# Patient Record
Sex: Female | Born: 1965 | Race: White | Hispanic: No | Marital: Married | Smoking: Former smoker
Health system: Southern US, Community
[De-identification: ages and names within clinical notes are randomized; demographics above are authoritative.]

## PROBLEM LIST (undated history)

## (undated) DIAGNOSIS — K219 Gastro-esophageal reflux disease without esophagitis: Secondary | ICD-10-CM

## (undated) DIAGNOSIS — M1990 Unspecified osteoarthritis, unspecified site: Secondary | ICD-10-CM

## (undated) DIAGNOSIS — K279 Peptic ulcer, site unspecified, unspecified as acute or chronic, without hemorrhage or perforation: Secondary | ICD-10-CM

## (undated) DIAGNOSIS — F4310 Post-traumatic stress disorder, unspecified: Secondary | ICD-10-CM

## (undated) DIAGNOSIS — T7840XA Allergy, unspecified, initial encounter: Secondary | ICD-10-CM

## (undated) DIAGNOSIS — K449 Diaphragmatic hernia without obstruction or gangrene: Secondary | ICD-10-CM

## (undated) DIAGNOSIS — F419 Anxiety disorder, unspecified: Secondary | ICD-10-CM

## (undated) DIAGNOSIS — J45909 Unspecified asthma, uncomplicated: Secondary | ICD-10-CM

## (undated) DIAGNOSIS — F32A Depression, unspecified: Secondary | ICD-10-CM

## (undated) DIAGNOSIS — R011 Cardiac murmur, unspecified: Secondary | ICD-10-CM

## (undated) DIAGNOSIS — I10 Essential (primary) hypertension: Secondary | ICD-10-CM

## (undated) DIAGNOSIS — N390 Urinary tract infection, site not specified: Secondary | ICD-10-CM

## (undated) DIAGNOSIS — F329 Major depressive disorder, single episode, unspecified: Secondary | ICD-10-CM

## (undated) DIAGNOSIS — D649 Anemia, unspecified: Secondary | ICD-10-CM

## (undated) HISTORY — DX: Peptic ulcer, site unspecified, unspecified as acute or chronic, without hemorrhage or perforation: K27.9

## (undated) HISTORY — PX: REDUCTION MAMMAPLASTY: SUR839

## (undated) HISTORY — DX: Diaphragmatic hernia without obstruction or gangrene: K44.9

## (undated) HISTORY — DX: Unspecified asthma, uncomplicated: J45.909

## (undated) HISTORY — DX: Cardiac murmur, unspecified: R01.1

## (undated) HISTORY — DX: Gastro-esophageal reflux disease without esophagitis: K21.9

## (undated) HISTORY — DX: Urinary tract infection, site not specified: N39.0

## (undated) HISTORY — PX: TONSILLECTOMY: SUR1361

## (undated) HISTORY — DX: Allergy, unspecified, initial encounter: T78.40XA

## (undated) HISTORY — PX: TUBAL LIGATION: SHX77

## (undated) HISTORY — PX: ABDOMINAL HYSTERECTOMY: SHX81

## (undated) HISTORY — DX: Anxiety disorder, unspecified: F41.9

## (undated) HISTORY — PX: BREAST BIOPSY: SHX20

## (undated) HISTORY — DX: Anemia, unspecified: D64.9

## (undated) HISTORY — PX: GASTRIC BYPASS: SHX52

## (undated) HISTORY — DX: Major depressive disorder, single episode, unspecified: F32.9

## (undated) HISTORY — DX: Depression, unspecified: F32.A

## (undated) HISTORY — DX: Post-traumatic stress disorder, unspecified: F43.10

## (undated) HISTORY — DX: Essential (primary) hypertension: I10

---

## 1998-02-09 ENCOUNTER — Ambulatory Visit (HOSPITAL_COMMUNITY): Admission: RE | Admit: 1998-02-09 | Discharge: 1998-02-09 | Payer: Self-pay | Admitting: Gastroenterology

## 2000-03-23 ENCOUNTER — Ambulatory Visit (HOSPITAL_COMMUNITY): Admission: RE | Admit: 2000-03-23 | Discharge: 2000-03-23 | Payer: Self-pay | Admitting: Gastroenterology

## 2000-03-23 ENCOUNTER — Encounter (INDEPENDENT_AMBULATORY_CARE_PROVIDER_SITE_OTHER): Payer: Self-pay | Admitting: Specialist

## 2000-03-28 ENCOUNTER — Other Ambulatory Visit: Admission: RE | Admit: 2000-03-28 | Discharge: 2000-03-28 | Payer: Self-pay | Admitting: Gynecology

## 2001-04-10 ENCOUNTER — Other Ambulatory Visit: Admission: RE | Admit: 2001-04-10 | Discharge: 2001-04-10 | Payer: Self-pay | Admitting: Gynecology

## 2001-04-11 ENCOUNTER — Ambulatory Visit (HOSPITAL_COMMUNITY): Admission: RE | Admit: 2001-04-11 | Discharge: 2001-04-11 | Payer: Self-pay | Admitting: General Surgery

## 2001-05-17 ENCOUNTER — Encounter: Payer: Self-pay | Admitting: Gynecology

## 2001-05-17 ENCOUNTER — Encounter: Admission: RE | Admit: 2001-05-17 | Discharge: 2001-05-17 | Payer: Self-pay | Admitting: Gynecology

## 2002-04-14 ENCOUNTER — Other Ambulatory Visit: Admission: RE | Admit: 2002-04-14 | Discharge: 2002-04-14 | Payer: Self-pay | Admitting: Gynecology

## 2002-11-06 ENCOUNTER — Encounter: Payer: Self-pay | Admitting: Family Medicine

## 2002-11-06 ENCOUNTER — Encounter: Admission: RE | Admit: 2002-11-06 | Discharge: 2002-11-06 | Payer: Self-pay | Admitting: Family Medicine

## 2003-04-15 ENCOUNTER — Other Ambulatory Visit: Admission: RE | Admit: 2003-04-15 | Discharge: 2003-04-15 | Payer: Self-pay | Admitting: Gynecology

## 2003-05-06 ENCOUNTER — Encounter: Admission: RE | Admit: 2003-05-06 | Discharge: 2003-08-04 | Payer: Self-pay | Admitting: Family Medicine

## 2003-05-20 ENCOUNTER — Encounter: Admission: RE | Admit: 2003-05-20 | Discharge: 2003-05-20 | Payer: Self-pay | Admitting: Family Medicine

## 2003-10-01 ENCOUNTER — Encounter: Admission: RE | Admit: 2003-10-01 | Discharge: 2003-10-01 | Payer: Self-pay | Admitting: Family Medicine

## 2003-11-02 ENCOUNTER — Encounter (INDEPENDENT_AMBULATORY_CARE_PROVIDER_SITE_OTHER): Payer: Self-pay | Admitting: *Deleted

## 2003-11-02 ENCOUNTER — Observation Stay (HOSPITAL_COMMUNITY): Admission: RE | Admit: 2003-11-02 | Discharge: 2003-11-03 | Payer: Self-pay | Admitting: Gynecology

## 2004-03-15 ENCOUNTER — Encounter: Admission: RE | Admit: 2004-03-15 | Discharge: 2004-06-13 | Payer: Self-pay | Admitting: Family Medicine

## 2004-04-18 ENCOUNTER — Other Ambulatory Visit: Admission: RE | Admit: 2004-04-18 | Discharge: 2004-04-18 | Payer: Self-pay | Admitting: Gynecology

## 2004-05-03 ENCOUNTER — Encounter: Admission: RE | Admit: 2004-05-03 | Discharge: 2004-05-03 | Payer: Self-pay | Admitting: Family Medicine

## 2004-07-07 ENCOUNTER — Encounter: Admission: RE | Admit: 2004-07-07 | Discharge: 2004-10-05 | Payer: Self-pay | Admitting: Family Medicine

## 2004-07-18 ENCOUNTER — Encounter: Admission: RE | Admit: 2004-07-18 | Discharge: 2004-08-01 | Payer: Self-pay | Admitting: Surgery

## 2004-08-08 ENCOUNTER — Ambulatory Visit (HOSPITAL_COMMUNITY): Admission: RE | Admit: 2004-08-08 | Discharge: 2004-08-08 | Payer: Self-pay | Admitting: Surgery

## 2004-08-12 ENCOUNTER — Ambulatory Visit (HOSPITAL_BASED_OUTPATIENT_CLINIC_OR_DEPARTMENT_OTHER): Admission: RE | Admit: 2004-08-12 | Discharge: 2004-08-12 | Payer: Self-pay | Admitting: Surgery

## 2004-08-15 ENCOUNTER — Ambulatory Visit: Payer: Self-pay | Admitting: Internal Medicine

## 2004-08-17 ENCOUNTER — Ambulatory Visit (HOSPITAL_COMMUNITY): Admission: RE | Admit: 2004-08-17 | Discharge: 2004-08-17 | Payer: Self-pay | Admitting: Surgery

## 2004-08-21 ENCOUNTER — Ambulatory Visit: Payer: Self-pay | Admitting: Internal Medicine

## 2004-09-05 ENCOUNTER — Ambulatory Visit: Payer: Self-pay | Admitting: Internal Medicine

## 2004-11-02 ENCOUNTER — Encounter: Admission: RE | Admit: 2004-11-02 | Discharge: 2005-01-22 | Payer: Self-pay | Admitting: Family Medicine

## 2004-11-29 ENCOUNTER — Ambulatory Visit (HOSPITAL_COMMUNITY): Admission: RE | Admit: 2004-11-29 | Discharge: 2004-11-29 | Payer: Self-pay | Admitting: Family Medicine

## 2004-11-29 ENCOUNTER — Encounter: Admission: RE | Admit: 2004-11-29 | Discharge: 2004-11-29 | Payer: Self-pay | Admitting: Family Medicine

## 2005-01-23 HISTORY — PX: GASTRIC BYPASS: SHX52

## 2005-03-30 ENCOUNTER — Encounter: Admission: RE | Admit: 2005-03-30 | Discharge: 2005-06-28 | Payer: Self-pay | Admitting: Family Medicine

## 2005-05-08 ENCOUNTER — Other Ambulatory Visit: Admission: RE | Admit: 2005-05-08 | Discharge: 2005-05-08 | Payer: Self-pay | Admitting: Gynecology

## 2005-05-10 ENCOUNTER — Encounter: Admission: RE | Admit: 2005-05-10 | Discharge: 2005-05-10 | Payer: Self-pay | Admitting: Gynecology

## 2005-06-08 ENCOUNTER — Encounter: Admission: RE | Admit: 2005-06-08 | Discharge: 2005-06-08 | Payer: Self-pay | Admitting: Gynecology

## 2005-07-12 ENCOUNTER — Encounter: Admission: RE | Admit: 2005-07-12 | Discharge: 2005-10-10 | Payer: Self-pay | Admitting: Family Medicine

## 2005-09-04 ENCOUNTER — Encounter: Admission: RE | Admit: 2005-09-04 | Discharge: 2005-09-04 | Payer: Self-pay | Admitting: Family Medicine

## 2005-11-06 ENCOUNTER — Encounter: Admission: RE | Admit: 2005-11-06 | Discharge: 2006-02-04 | Payer: Self-pay | Admitting: Family Medicine

## 2005-11-20 ENCOUNTER — Inpatient Hospital Stay (HOSPITAL_COMMUNITY): Admission: RE | Admit: 2005-11-20 | Discharge: 2005-11-23 | Payer: Self-pay | Admitting: Surgery

## 2005-11-21 ENCOUNTER — Encounter: Payer: Self-pay | Admitting: Vascular Surgery

## 2005-11-30 ENCOUNTER — Encounter: Admission: RE | Admit: 2005-11-30 | Discharge: 2005-11-30 | Payer: Self-pay | Admitting: Surgery

## 2006-01-08 ENCOUNTER — Ambulatory Visit: Payer: Self-pay | Admitting: Family Medicine

## 2006-01-08 LAB — CONVERTED CEMR LAB
AST: 33 units/L (ref 0–37)
Albumin: 3.6 g/dL (ref 3.5–5.2)
Alkaline Phosphatase: 52 units/L (ref 39–117)
BUN: 8 mg/dL (ref 6–23)
Calcium: 9.2 mg/dL (ref 8.4–10.5)
Chol/HDL Ratio, serum: 3.3
Cholesterol: 118 mg/dL (ref 0–200)
Creatinine, Ser: 1 mg/dL (ref 0.4–1.2)
Eosinophil percent: 4.2 % (ref 0.0–5.0)
Ferritin: 171.6 ng/mL (ref 10.0–291.0)
Folate: >20 ng/mL
HCT: 40.6 % (ref 36.0–46.0)
Hemoglobin: 13.5 g/dL (ref 12.0–15.0)
LDL Cholesterol: 69 mg/dL (ref 0–99)
Neutrophils Relative %: 48.2 % (ref 43.0–77.0)
Potassium: 3.5 meq/L (ref 3.5–5.1)
RDW: 12.9 % (ref 11.5–14.6)
Total Bilirubin: 0.8 mg/dL (ref 0.3–1.2)
Total Protein: 6 g/dL (ref 6.0–8.3)
VLDL: 13 mg/dL (ref 0–40)

## 2006-01-31 ENCOUNTER — Ambulatory Visit: Payer: Self-pay | Admitting: Family Medicine

## 2006-01-31 LAB — CONVERTED CEMR LAB: Vit D, 1,25-Dihydroxy: 43 (ref 20–57)

## 2006-02-02 ENCOUNTER — Ambulatory Visit: Payer: Self-pay | Admitting: Family Medicine

## 2006-02-12 ENCOUNTER — Ambulatory Visit: Payer: Self-pay | Admitting: Family Medicine

## 2006-02-13 ENCOUNTER — Encounter: Admission: RE | Admit: 2006-02-13 | Discharge: 2006-02-13 | Payer: Self-pay | Admitting: Family Medicine

## 2006-02-14 ENCOUNTER — Encounter: Admission: RE | Admit: 2006-02-14 | Discharge: 2006-05-15 | Payer: Self-pay | Admitting: Family Medicine

## 2006-02-20 ENCOUNTER — Ambulatory Visit: Payer: Self-pay | Admitting: Family Medicine

## 2006-03-06 ENCOUNTER — Ambulatory Visit: Payer: Self-pay | Admitting: Family Medicine

## 2006-03-07 ENCOUNTER — Ambulatory Visit: Payer: Self-pay | Admitting: *Deleted

## 2006-03-16 ENCOUNTER — Encounter: Admission: RE | Admit: 2006-03-16 | Discharge: 2006-03-16 | Payer: Self-pay | Admitting: Family Medicine

## 2006-05-04 ENCOUNTER — Ambulatory Visit: Payer: Self-pay | Admitting: Family Medicine

## 2006-05-09 ENCOUNTER — Encounter: Admission: RE | Admit: 2006-05-09 | Discharge: 2006-08-07 | Payer: Self-pay | Admitting: Family Medicine

## 2006-05-14 ENCOUNTER — Other Ambulatory Visit: Admission: RE | Admit: 2006-05-14 | Discharge: 2006-05-14 | Payer: Self-pay | Admitting: Gynecology

## 2006-05-16 ENCOUNTER — Encounter: Admission: RE | Admit: 2006-05-16 | Discharge: 2006-05-16 | Payer: Self-pay | Admitting: Family Medicine

## 2006-07-31 ENCOUNTER — Ambulatory Visit (HOSPITAL_BASED_OUTPATIENT_CLINIC_OR_DEPARTMENT_OTHER): Admission: RE | Admit: 2006-07-31 | Discharge: 2006-08-01 | Payer: Self-pay | Admitting: Plastic Surgery

## 2006-07-31 ENCOUNTER — Encounter (INDEPENDENT_AMBULATORY_CARE_PROVIDER_SITE_OTHER): Payer: Self-pay | Admitting: Plastic Surgery

## 2006-08-10 ENCOUNTER — Encounter: Payer: Self-pay | Admitting: Family Medicine

## 2006-11-28 ENCOUNTER — Encounter: Admission: RE | Admit: 2006-11-28 | Discharge: 2006-11-28 | Payer: Self-pay | Admitting: Family Medicine

## 2006-12-28 ENCOUNTER — Ambulatory Visit: Payer: Self-pay | Admitting: Family Medicine

## 2006-12-28 DIAGNOSIS — J069 Acute upper respiratory infection, unspecified: Secondary | ICD-10-CM | POA: Insufficient documentation

## 2007-02-19 ENCOUNTER — Ambulatory Visit: Payer: Self-pay | Admitting: Family Medicine

## 2007-02-19 DIAGNOSIS — Z9884 Bariatric surgery status: Secondary | ICD-10-CM | POA: Insufficient documentation

## 2007-02-25 ENCOUNTER — Ambulatory Visit: Payer: Self-pay | Admitting: Family Medicine

## 2007-03-06 DIAGNOSIS — R945 Abnormal results of liver function studies: Secondary | ICD-10-CM | POA: Insufficient documentation

## 2007-03-06 LAB — CONVERTED CEMR LAB
ALT: 244 units/L — ABNORMAL HIGH (ref 0–35)
BUN: 7 mg/dL (ref 6–23)
Bilirubin, Direct: 0.2 mg/dL (ref 0.0–0.3)
Calcium: 8.9 mg/dL (ref 8.4–10.5)
Cholesterol: 121 mg/dL (ref 0–200)
Eosinophils Absolute: 0.2 10*3/uL (ref 0.0–0.6)
Eosinophils Relative: 6.8 % — ABNORMAL HIGH (ref 0.0–5.0)
GFR calc Af Amer: 89 mL/min
GFR calc non Af Amer: 73 mL/min
Glucose, Bld: 85 mg/dL (ref 70–99)
HDL: 47.8 mg/dL (ref >39.0)
Lymphocytes Relative: 49.1 % — ABNORMAL HIGH (ref 12.0–46.0)
MCV: 96.7 fL (ref 78.0–100.0)
Monocytes Relative: 6.9 % (ref 3.0–11.0)
Neutro Abs: 1.3 10*3/uL — ABNORMAL LOW (ref 1.4–7.7)
Platelets: 188 10*3/uL (ref 150–400)
Saturation Ratios: 28.7 % (ref 20.0–50.0)
Transferrin: 214.3 mg/dL (ref >=212.0)
Triglycerides: 30 mg/dL (ref 0–149)
Vitamin B-12: 740 pg/mL (ref 211–911)

## 2007-03-07 ENCOUNTER — Telehealth (INDEPENDENT_AMBULATORY_CARE_PROVIDER_SITE_OTHER): Payer: Self-pay | Admitting: *Deleted

## 2007-03-11 ENCOUNTER — Ambulatory Visit: Payer: Self-pay | Admitting: Family Medicine

## 2007-03-12 LAB — CONVERTED CEMR LAB
Hep A IgM: NEGATIVE
Hep B C IgM: NEGATIVE

## 2007-03-18 ENCOUNTER — Encounter: Admission: RE | Admit: 2007-03-18 | Discharge: 2007-03-18 | Payer: Self-pay | Admitting: Family Medicine

## 2007-03-20 ENCOUNTER — Encounter (INDEPENDENT_AMBULATORY_CARE_PROVIDER_SITE_OTHER): Payer: Self-pay | Admitting: *Deleted

## 2007-03-20 LAB — CONVERTED CEMR LAB
ALT: 141 units/L — ABNORMAL HIGH (ref 0–35)
Albumin: 3.4 g/dL — ABNORMAL LOW (ref 3.5–5.2)
Alkaline Phosphatase: 51 units/L (ref 39–117)
Basophils Absolute: 0.1 10*3/uL (ref 0.0–0.1)
Eosinophils Absolute: 0.3 10*3/uL (ref 0.0–0.6)
MCHC: 33.7 g/dL (ref 30.0–36.0)
MCV: 96.5 fL (ref 78.0–100.0)
Monocytes Relative: 7.6 % (ref 3.0–11.0)
Platelets: 175 10*3/uL (ref 150–400)
RBC: 3.92 M/uL (ref 3.87–5.11)
WBC: 5.3 10*3/uL (ref 4.5–10.5)

## 2007-04-03 LAB — HM MAMMOGRAPHY: HM Mammogram: NORMAL

## 2007-04-12 ENCOUNTER — Encounter: Payer: Self-pay | Admitting: Family Medicine

## 2007-04-19 ENCOUNTER — Ambulatory Visit: Payer: Self-pay | Admitting: Family Medicine

## 2007-04-21 LAB — CONVERTED CEMR LAB
Bilirubin, Direct: 0.2 mg/dL (ref 0.0–0.3)
Total Bilirubin: 0.8 mg/dL (ref 0.3–1.2)

## 2007-04-22 ENCOUNTER — Encounter (INDEPENDENT_AMBULATORY_CARE_PROVIDER_SITE_OTHER): Payer: Self-pay | Admitting: *Deleted

## 2007-04-25 ENCOUNTER — Encounter: Payer: Self-pay | Admitting: Family Medicine

## 2007-05-17 ENCOUNTER — Encounter: Admission: RE | Admit: 2007-05-17 | Discharge: 2007-05-17 | Payer: Self-pay | Admitting: Gynecology

## 2007-10-18 ENCOUNTER — Encounter: Payer: Self-pay | Admitting: Family Medicine

## 2008-02-04 ENCOUNTER — Ambulatory Visit: Payer: Self-pay | Admitting: Family Medicine

## 2008-02-04 DIAGNOSIS — D1801 Hemangioma of skin and subcutaneous tissue: Secondary | ICD-10-CM | POA: Insufficient documentation

## 2008-02-04 DIAGNOSIS — R109 Unspecified abdominal pain: Secondary | ICD-10-CM | POA: Insufficient documentation

## 2008-02-11 ENCOUNTER — Encounter: Admission: RE | Admit: 2008-02-11 | Discharge: 2008-02-11 | Payer: Self-pay | Admitting: Family Medicine

## 2008-02-12 ENCOUNTER — Telehealth (INDEPENDENT_AMBULATORY_CARE_PROVIDER_SITE_OTHER): Payer: Self-pay | Admitting: *Deleted

## 2008-02-13 ENCOUNTER — Telehealth: Payer: Self-pay | Admitting: Family Medicine

## 2008-02-17 ENCOUNTER — Encounter: Payer: Self-pay | Admitting: Family Medicine

## 2008-02-20 ENCOUNTER — Ambulatory Visit (HOSPITAL_COMMUNITY): Admission: RE | Admit: 2008-02-20 | Discharge: 2008-02-20 | Payer: Self-pay | Admitting: Family Medicine

## 2008-02-21 ENCOUNTER — Telehealth (INDEPENDENT_AMBULATORY_CARE_PROVIDER_SITE_OTHER): Payer: Self-pay | Admitting: *Deleted

## 2008-02-27 ENCOUNTER — Ambulatory Visit: Payer: Self-pay | Admitting: Family Medicine

## 2008-02-27 DIAGNOSIS — F32A Depression, unspecified: Secondary | ICD-10-CM | POA: Insufficient documentation

## 2008-02-27 DIAGNOSIS — F329 Major depressive disorder, single episode, unspecified: Secondary | ICD-10-CM

## 2008-03-11 ENCOUNTER — Telehealth: Payer: Self-pay | Admitting: Family Medicine

## 2008-03-12 ENCOUNTER — Telehealth (INDEPENDENT_AMBULATORY_CARE_PROVIDER_SITE_OTHER): Payer: Self-pay | Admitting: *Deleted

## 2008-03-23 ENCOUNTER — Ambulatory Visit: Payer: Self-pay | Admitting: Family Medicine

## 2008-03-23 DIAGNOSIS — D7289 Other specified disorders of white blood cells: Secondary | ICD-10-CM | POA: Insufficient documentation

## 2008-03-24 LAB — CONVERTED CEMR LAB
Hep A IgM: NEGATIVE
Hep B C IgM: NEGATIVE
Hepatitis B Surface Ag: NEGATIVE

## 2008-03-25 ENCOUNTER — Encounter (INDEPENDENT_AMBULATORY_CARE_PROVIDER_SITE_OTHER): Payer: Self-pay | Admitting: *Deleted

## 2008-04-06 LAB — CONVERTED CEMR LAB
Albumin: 3.5 g/dL (ref 3.5–5.2)
Alkaline Phosphatase: 50 units/L (ref 39–117)
Basophils Absolute: 0 10*3/uL (ref 0.0–0.1)
Bilirubin, Direct: 0.1 mg/dL (ref 0.0–0.3)
Eosinophils Absolute: 0.3 10*3/uL (ref 0.0–0.7)
Eosinophils Relative: 6 % — ABNORMAL HIGH (ref 0.0–5.0)
HCT: 38.1 % (ref 36.0–46.0)
MCHC: 34.7 g/dL (ref 30.0–36.0)
MCV: 95.5 fL (ref 78.0–100.0)
Monocytes Absolute: 0.3 10*3/uL (ref 0.1–1.0)
Neutrophils Relative %: 50.3 % (ref 43.0–77.0)
Platelets: 194 10*3/uL (ref 150–400)
Total Protein: 6.1 g/dL (ref 6.0–8.3)
WBC: 4.5 10*3/uL (ref 4.5–10.5)

## 2008-04-07 ENCOUNTER — Encounter (INDEPENDENT_AMBULATORY_CARE_PROVIDER_SITE_OTHER): Payer: Self-pay | Admitting: *Deleted

## 2008-05-01 ENCOUNTER — Encounter: Payer: Self-pay | Admitting: Family Medicine

## 2008-05-21 ENCOUNTER — Encounter: Admission: RE | Admit: 2008-05-21 | Discharge: 2008-05-21 | Payer: Self-pay | Admitting: Gynecology

## 2008-06-09 ENCOUNTER — Ambulatory Visit: Payer: Self-pay | Admitting: Family Medicine

## 2008-06-09 DIAGNOSIS — S139XXA Sprain of joints and ligaments of unspecified parts of neck, initial encounter: Secondary | ICD-10-CM | POA: Insufficient documentation

## 2008-11-24 ENCOUNTER — Telehealth (INDEPENDENT_AMBULATORY_CARE_PROVIDER_SITE_OTHER): Payer: Self-pay | Admitting: *Deleted

## 2008-11-25 ENCOUNTER — Ambulatory Visit: Payer: Self-pay | Admitting: Family Medicine

## 2008-11-25 DIAGNOSIS — N898 Other specified noninflammatory disorders of vagina: Secondary | ICD-10-CM | POA: Insufficient documentation

## 2008-11-25 LAB — CONVERTED CEMR LAB
Glucose, Urine, Semiquant: NEGATIVE
Protein, U semiquant: NEGATIVE
Urobilinogen, UA: 0.2
WBC Urine, dipstick: NEGATIVE

## 2008-11-26 ENCOUNTER — Encounter: Admission: RE | Admit: 2008-11-26 | Discharge: 2008-11-26 | Payer: Self-pay | Admitting: Family Medicine

## 2008-11-26 ENCOUNTER — Encounter: Payer: Self-pay | Admitting: Family Medicine

## 2008-11-27 ENCOUNTER — Encounter: Payer: Self-pay | Admitting: Family Medicine

## 2008-11-30 ENCOUNTER — Encounter (INDEPENDENT_AMBULATORY_CARE_PROVIDER_SITE_OTHER): Payer: Self-pay | Admitting: *Deleted

## 2008-11-30 LAB — CONVERTED CEMR LAB
Albumin: 4.1 g/dL (ref 3.5–5.2)
Alkaline Phosphatase: 52 units/L (ref 39–117)
Basophils Absolute: 0 10*3/uL (ref 0.0–0.1)
Bilirubin, Direct: 0.1 mg/dL (ref 0.0–0.3)
CO2: 29 meq/L (ref 19–32)
Calcium: 8.9 mg/dL (ref 8.4–10.5)
Creatinine, Ser: 0.8 mg/dL (ref 0.4–1.2)
Eosinophils Absolute: 0.2 10*3/uL (ref 0.0–0.7)
GFR calc non Af Amer: 82.96 mL/min (ref >60)
Glucose, Bld: 89 mg/dL (ref 70–99)
Lymphocytes Relative: 34.4 % (ref 12.0–46.0)
MCHC: 35 g/dL (ref 30.0–36.0)
Neutrophils Relative %: 55.1 % (ref 43.0–77.0)
Platelets: 188 10*3/uL (ref 150.0–400.0)
RDW: 11.8 % (ref 11.5–14.6)
Total Bilirubin: 0.8 mg/dL (ref 0.3–1.2)

## 2009-01-21 ENCOUNTER — Ambulatory Visit: Payer: Self-pay | Admitting: Family Medicine

## 2009-01-21 DIAGNOSIS — R300 Dysuria: Secondary | ICD-10-CM | POA: Insufficient documentation

## 2009-01-21 DIAGNOSIS — G2581 Restless legs syndrome: Secondary | ICD-10-CM | POA: Insufficient documentation

## 2009-01-21 LAB — CONVERTED CEMR LAB
Bilirubin Urine: NEGATIVE
Glucose, Urine, Semiquant: NEGATIVE
Ketones, urine, test strip: NEGATIVE
Protein, U semiquant: NEGATIVE
pH: 5

## 2009-01-22 ENCOUNTER — Encounter: Payer: Self-pay | Admitting: Family Medicine

## 2009-01-25 ENCOUNTER — Telehealth (INDEPENDENT_AMBULATORY_CARE_PROVIDER_SITE_OTHER): Payer: Self-pay | Admitting: *Deleted

## 2009-01-29 ENCOUNTER — Ambulatory Visit: Payer: Self-pay | Admitting: Family Medicine

## 2009-01-29 LAB — CONVERTED CEMR LAB
Blood in Urine, dipstick: NEGATIVE
Glucose, Urine, Semiquant: NEGATIVE
Specific Gravity, Urine: 1.015
WBC Urine, dipstick: NEGATIVE
pH: 6

## 2009-02-02 ENCOUNTER — Encounter (INDEPENDENT_AMBULATORY_CARE_PROVIDER_SITE_OTHER): Payer: Self-pay | Admitting: *Deleted

## 2009-02-02 LAB — CONVERTED CEMR LAB
Calcium: 8.6 mg/dL (ref 8.4–10.5)
Chloride: 108 meq/L (ref 96–112)
Creatinine, Ser: 0.81 mg/dL (ref 0.40–1.20)
Magnesium: 2.2 mg/dL (ref 1.5–2.5)

## 2009-02-23 ENCOUNTER — Telehealth (INDEPENDENT_AMBULATORY_CARE_PROVIDER_SITE_OTHER): Payer: Self-pay | Admitting: *Deleted

## 2009-06-03 ENCOUNTER — Encounter: Payer: Self-pay | Admitting: Family Medicine

## 2009-06-09 LAB — HM PAP SMEAR

## 2010-01-07 ENCOUNTER — Encounter: Payer: Self-pay | Admitting: Family Medicine

## 2010-01-07 ENCOUNTER — Ambulatory Visit: Payer: Self-pay | Admitting: Family Medicine

## 2010-01-07 LAB — CONVERTED CEMR LAB
Glucose, Urine, Semiquant: NEGATIVE
Specific Gravity, Urine: <1.005
WBC Urine, dipstick: NEGATIVE
pH: 7.5

## 2010-01-10 ENCOUNTER — Encounter
Admission: RE | Admit: 2010-01-10 | Discharge: 2010-01-10 | Payer: Self-pay | Source: Home / Self Care | Attending: Gynecology | Admitting: Gynecology

## 2010-01-10 LAB — CONVERTED CEMR LAB: Vit D, 25-Hydroxy: 43 ng/mL (ref 30–89)

## 2010-01-13 LAB — CONVERTED CEMR LAB
Alkaline Phosphatase: 51 units/L (ref 39–117)
Basophils Relative: 0.8 % (ref 0.0–3.0)
CO2: 27 meq/L (ref 19–32)
Chloride: 108 meq/L (ref 96–112)
Eosinophils Absolute: 0.3 10*3/uL (ref 0.0–0.7)
Hemoglobin: 12.2 g/dL (ref 12.0–15.0)
Lymphocytes Relative: 30.2 % (ref 12.0–46.0)
MCHC: 33.8 g/dL (ref 30.0–36.0)
MCV: 87.5 fL (ref 78.0–100.0)
Monocytes Absolute: 0.4 10*3/uL (ref 0.1–1.0)
Neutro Abs: 3 10*3/uL (ref 1.4–7.7)
RBC: 4.13 M/uL (ref 3.87–5.11)
Sodium: 142 meq/L (ref 135–145)
TSH: 2.88 microintl units/mL (ref 0.35–5.50)
Total CHOL/HDL Ratio: 2
Vitamin B-12: 761 pg/mL (ref 211–911)

## 2010-02-13 ENCOUNTER — Encounter: Payer: Self-pay | Admitting: Gynecology

## 2010-02-13 ENCOUNTER — Encounter: Payer: Self-pay | Admitting: Family Medicine

## 2010-02-20 LAB — CONVERTED CEMR LAB
ALT: 68 units/L — ABNORMAL HIGH (ref 0–35)
Albumin: 3.8 g/dL (ref 3.5–5.2)
BUN: 12 mg/dL (ref 6–23)
Basophils Absolute: 0 10*3/uL (ref 0.0–0.1)
Basophils Relative: 1.2 % (ref 0.0–3.0)
Bilirubin Urine: NEGATIVE
Bilirubin, Direct: 0.1 mg/dL (ref 0.0–0.3)
CO2: 0 meq/L — CL (ref 19–32)
Calcium: 9 mg/dL (ref 8.4–10.5)
Cholesterol: 154 mg/dL (ref 0–200)
Creatinine, Ser: 0.8 mg/dL (ref 0.4–1.2)
Eosinophils Absolute: 0.2 10*3/uL (ref 0.0–0.7)
Eosinophils Relative: 5.8 % — ABNORMAL HIGH (ref 0.0–5.0)
GFR calc Af Amer: 101 mL/min
Glucose, Bld: 88 mg/dL (ref 70–99)
Glucose, Urine, Semiquant: NEGATIVE
HCT: 35.5 % — ABNORMAL LOW (ref 36.0–46.0)
HDL: 66.4 mg/dL (ref >39.0)
Iron: 82 ug/dL (ref 42–145)
Ketones, urine, test strip: NEGATIVE
MCHC: 34.9 g/dL (ref 30.0–36.0)
MCV: 94.6 fL (ref 78.0–100.0)
Monocytes Absolute: 0.3 10*3/uL (ref 0.1–1.0)
Neutrophils Relative %: 45.9 % (ref 43.0–77.0)
Pap Smear: NORMAL
RBC: 3.76 M/uL — ABNORMAL LOW (ref 3.87–5.11)
Saturation Ratios: 22.2 % (ref 20.0–50.0)
Sodium: 141 meq/L (ref 135–145)
Specific Gravity, Urine: 1.01
Total Protein: 6.1 g/dL (ref 6.0–8.3)
Transferrin: 264.4 mg/dL (ref >=212.0)
Urobilinogen, UA: NEGATIVE
VLDL: 7 mg/dL (ref 0–40)

## 2010-02-24 NOTE — Letter (Signed)
Summary: [facility]: Immunoassay Fecal Occult Blood (iFOB) Order Form  [facility] [place]  [address] [place], [place] [address]   Phone: [phone]  Fax: [phone]      [facility]: Immunoassay Fecal Occult Blood (iFOB) Order Form   January 07, 2010 MRN: [id number]   [name] Aug 20, 1965   Physicican Name: Dr.Lowne  Diagnosis Code:  V56.71      Almeta Monas CMA (AAMA)

## 2010-02-24 NOTE — Letter (Signed)
Summary: Results Follow up Letter  [facility] [place]  [address] [place], [place] [address]   Phone: [phone]  Fax: [phone]    02/02/2009 MRN: [id number]  YOUNG BRIM [address] [place], [place]  [address]  Dear Ms. Ketron,  The following are the results of your recent test(s):  Test         Result    Pap Smear:        Normal _____  Not Normal _____ Comments: ______________________________________________________ Cholesterol: LDL(Bad cholesterol):         Your goal is less than:         HDL (Good cholesterol):       Your goal is more than: Comments:  ______________________________________________________ Mammogram:        Normal _____  Not Normal _____ Comments:  ___________________________________________________________________ Hemoccult:        Normal _____  Not normal _______ Comments:    _____________________________________________________________________ Other Tests:  See attachment for results.   We routinely do not discuss normal results over the telephone.  If you desire a copy of the results, or you have any questions about this information we can discuss them at your next office visit.   Sincerely,   Army Fossa CMA  February 02, 2009 11:19 AM

## 2010-02-24 NOTE — Assessment & Plan Note (Signed)
Summary: CPX//PH   Vital Signs:  Patient profile:   45 year old female Height:      61 inches Weight:      144.0 pounds BMI:     27.31 Temp:     98.3 degrees F oral Pulse rate:   60 / minute Pulse rhythm:   regular BP sitting:   114 / 76  (right arm) Cuff size:   regular  Vitals Entered By: Almeta Monas CMA Duncan Dull) (January 07, 2010 8:26 AM) CC: CPX/Fasting--- no problems and no pap   History of Present Illness: Pt here for cpe and labs.  Pt sees Dr Nicholas Lose for gyn exam.   No complaints.     Preventive Screening-Counseling & Management  Alcohol-Tobacco     Alcohol drinks/day: <1     Alcohol type: coconut rum      Smoking Status: quit     Year Quit: 1987     Pack years: 1/2 q 2 weeks for 6 years     Passive Smoke Exposure: no  Caffeine-Diet-Exercise     Caffeine use/day: 2     Does Patient Exercise: yes     Type of exercise: gym, walking , weight, bike     Times/week: 3  Hep-HIV-STD-Contraception     HIV Risk: no     Dental Visit-last 6 months yes     Dental Care Counseling: not indicated; dental care within six months     SBE monthly: yes     SBE Education/Counseling: not indicated; SBE done regularly  Safety-Violence-Falls     Seat Belt Use: 100      Sexual History:  currently monogamous.    Problems Prior to Update: 1)  Restless Legs Syndrome  (ICD-333.94) 2)  Dysuria  (ICD-788.1) 3)  Vaginal Bleeding  (ICD-623.8) 4)  Abdominal Pain Other Specified Site  (ICD-789.09) 5)  Neck Sprain and Strain  (ICD-847.0) 6)  Lymphopenia  (ICD-288.8) 7)  Depression  (ICD-311) 8)  Preventive Health Care  (ICD-V70.0) 9)  Family History Diabetes 1st Degree Relative  (ICD-V18.0) 10)  Family History Depression  (ICD-V17.0) 11)  Hemangioma, Skin  (ICD-228.01) 12)  Abdominal Pain Other Specified Site  (ICD-789.09) 13)  Liver Function Tests, Abnormal  (ICD-794.8) 14)  Bariatric Surgery Status  (ICD-V45.86) 15)  Uri  (ICD-465.9)  Medications Prior to Update: 1)   Wellbutrin 100 Mg  Tabs (Bupropion Hcl) .... 3 By Mouth Qd 2)  Promethazine Hcl 25 Mg  Tabs (Promethazine Hcl) .... As Needed 3)  Trazodone Hcl 100 Mg  Tabs (Trazodone Hcl) .... Take One Tablet Daily 4)  Singulair 10 Mg  Tabs (Montelukast Sodium) .... Take One Tablet 5)  Prozac 40 Mg Caps (Fluoxetine Hcl) .Marland Kitchen.. 1 By Mouth Once Daily 6)  Aciphex 20 Mg Tbec (Rabeprazole Sodium) .Marland Kitchen.. 1 By Mouth Once Daily 7)  Flexeril 10 Mg Tabs (Cyclobenzaprine Hcl) .Marland Kitchen.. 1 By Mouth Three Times A Day As Needed 8)  Requip 0.25 Mg Tabs (Ropinirole Hcl) .Marland Kitchen.. 1 By Mouth At Bedtime For 3 Days Then 2 By Mouth Once Daily  Current Medications (verified): 1)  Wellbutrin 100 Mg  Tabs (Bupropion Hcl) .... 3 By Mouth Qd 2)  Trazodone Hcl 100 Mg  Tabs (Trazodone Hcl) .... Take One Tablet Daily 3)  Singulair 10 Mg  Tabs (Montelukast Sodium) .... Take One Tablet 4)  Prozac 40 Mg Caps (Fluoxetine Hcl) .Marland Kitchen.. 1 By Mouth Once Daily 5)  Aciphex 20 Mg Tbec (Rabeprazole Sodium) .Marland Kitchen.. 1 By Mouth Once Daily 6)  Flexeril 10 Mg Tabs (Cyclobenzaprine Hcl) .Marland Kitchen.. 1 By Mouth Three Times A Day As Needed 7)  Requip 0.25 Mg Tabs (Ropinirole Hcl) .Marland Kitchen.. 1 By Mouth At Bedtime For 3 Days Then 2 By Mouth Once Daily  Allergies (verified): 1)  ! Penicillin  Past History:  Past Medical History: Last updated: 02/19/2007 Allergic rhinitis GERD Depression  Past Surgical History: Last updated: 02/19/2007 Gastric Bypass Hysterectomy-fibroid  Family History: Last updated: 02/27/2008 Family History Depression Family History Diabetes 1st degree relative Family History Hypertension Family History Liver disease Family History Lung cancer Family History of Prostate CA 1st degree relative <50  Social History: Last updated: 02/27/2008 Occupation:  [employer] Married Former Smoker Alcohol use-yes Drug use-no Regular exercise-yes  Risk Factors: Alcohol Use: <1 (01/07/2010) Caffeine Use: 2 (01/07/2010) Exercise: yes (01/07/2010)  Risk  Factors: Smoking Status: quit (01/07/2010) Passive Smoke Exposure: no (01/07/2010)  Family History: Reviewed history from 02/27/2008 and no changes required. Family History Depression Family History Diabetes 1st degree relative Family History Hypertension Family History Liver disease Family History Lung cancer Family History of Prostate CA 1st degree relative <50  Social History: Reviewed history from 02/27/2008 and no changes required. Occupation:  [employer] Married Former Smoker Alcohol use-yes Drug use-no Regular exercise-yes Dental Care w/in 6 mos.:  yes Sexual History:  currently monogamous  Review of Systems      See HPI General:  Denies chills, fatigue, fever, loss of appetite, malaise, sleep disorder, sweats, weakness, and weight loss. Eyes:  Denies blurring, discharge, double vision, eye irritation, eye pain, halos, itching, light sensitivity, red eye, vision loss-1 eye, and vision loss-both eyes; optho-q1y. ENT:  Denies decreased hearing, difficulty swallowing, ear discharge, earache, hoarseness, nasal congestion, nosebleeds, postnasal drainage, ringing in ears, sinus pressure, and sore throat. CV:  Denies bluish discoloration of lips or nails, chest pain or discomfort, difficulty breathing at night, difficulty breathing while lying down, fainting, fatigue, leg cramps with exertion, lightheadness, near fainting, palpitations, shortness of breath with exertion, swelling of feet, swelling of hands, and weight gain. Resp:  Denies chest discomfort, chest pain with inspiration, cough, coughing up blood, excessive snoring, hypersomnolence, morning headaches, pleuritic, shortness of breath, sputum productive, and wheezing. GI:  Denies abdominal pain, bloody stools, change in bowel habits, constipation, dark tarry stools, diarrhea, excessive appetite, gas, hemorrhoids, indigestion, loss of appetite, nausea, vomiting, vomiting blood, and yellowish skin color. GU:  Denies  abnormal vaginal bleeding, decreased libido, discharge, dysuria, genital sores, hematuria, incontinence, nocturia, urinary frequency, and urinary hesitancy. MS:  Denies joint pain, joint redness, joint swelling, loss of strength, low back pain, mid back pain, muscle aches, muscle , cramps, muscle weakness, stiffness, and thoracic pain. Derm:  Denies changes in color of skin, changes in nail beds, dryness, excessive perspiration, flushing, hair loss, insect bite(s), itching, lesion(s), poor wound healing, and rash. Neuro:  Denies brief paralysis, difficulty with concentration, disturbances in coordination, falling down, headaches, inability to speak, memory loss, numbness, poor balance, seizures, sensation of room spinning, tingling, tremors, visual disturbances, and weakness. Psych:  Denies alternate hallucination ( auditory/visual), anxiety, depression, easily angered, easily tearful, irritability, mental problems, panic attacks, sense of great danger, suicidal thoughts/plans, thoughts of violence, unusual visions or sounds, and thoughts /plans of harming others. Endo:  Denies cold intolerance, excessive hunger, excessive thirst, excessive urination, heat intolerance, polyuria, and weight change. Heme:  Denies abnormal bruising, bleeding, enlarge lymph nodes, fevers, pallor, and skin discoloration. Allergy:  Denies hives or rash, itching eyes, persistent infections, seasonal allergies, and sneezing.  Physical Exam  General:  Well-developed,well-nourished,in no acute distress; alert,appropriate and cooperative throughout examination Head:  Normocephalic and atraumatic without obvious abnormalities. No apparent alopecia or balding. Eyes:  vision grossly intact, pupils equal, pupils round, pupils reactive to light, and no injection.   Ears:  External ear exam shows no significant lesions or deformities.  Otoscopic examination reveals clear canals, tympanic membranes are intact bilaterally without  bulging, retraction, inflammation or discharge. Hearing is grossly normal bilaterally. Nose:  External nasal examination shows no deformity or inflammation. Nasal mucosa are pink and moist without lesions or exudates. Mouth:  Oral mucosa and oropharynx without lesions or exudates.  Teeth in good repair. Neck:  No deformities, masses, or tenderness noted. Chest Wall:  No deformities, masses, or tenderness noted. Breasts:  gyn Lungs:  Normal respiratory effort, chest expands symmetrically. Lungs are clear to auscultation, no crackles or wheezes. Heart:  normal rate and no murmur.   Abdomen:  Bowel sounds positive,abdomen soft and non-tender without masses, organomegaly or hernias noted. Genitalia:  gyn Msk:  normal ROM, no joint tenderness, no joint swelling, no joint warmth, no redness over joints, no joint deformities, no joint instability, and no crepitation.   Pulses:  R and L carotid,radial,femoral,dorsalis pedis and posterior tibial pulses are full and equal bilaterally Extremities:  No clubbing, cyanosis, edema, or deformity noted with normal full range of motion of all joints.   Neurologic:  No cranial nerve deficits noted. Station and gait are normal. Plantar reflexes are down-going bilaterally. DTRs are symmetrical throughout. Sensory, motor and coordinative functions appear intact. Skin:  Intact without suspicious lesions or rashes Cervical Nodes:  No lymphadenopathy noted Axillary Nodes:  No palpable lymphadenopathy Psych:  Cognition and judgment appear intact. Alert and cooperative with normal attention span and concentration. No apparent delusions, illusions, hallucinations   Impression & Recommendations:  Problem # 1:  PREVENTIVE HEALTH CARE (ICD-V70.0) ghm utd  Orders: Venipuncture (16109) TLB-Lipid Panel (80061-LIPID) TLB-BMP (Basic Metabolic Panel-BMET) (80048-METABOL) TLB-CBC Platelet - w/Differential (85025-CBCD) TLB-Hepatic/Liver Function Pnl (80076-HEPATIC) TLB-TSH  (Thyroid Stimulating Hormone) (84443-TSH) TLB-B12 + Folate Pnl (60454_09811-B14/NWG) T-Vitamin D (25-Hydroxy) (95621-30865) UA Dipstick w/o Micro (manual) (78469) EKG w/ Interpretation (93000)  Complete Medication List: 1)  Wellbutrin 100 Mg Tabs (Bupropion hcl) .... 3 by mouth qd 2)  Trazodone Hcl 100 Mg Tabs (Trazodone hcl) .... Take one tablet daily 3)  Singulair 10 Mg Tabs (Montelukast sodium) .... Take one tablet 4)  Prozac 40 Mg Caps (Fluoxetine hcl) .Marland Kitchen.. 1 by mouth once daily 5)  Aciphex 20 Mg Tbec (Rabeprazole sodium) .Marland Kitchen.. 1 by mouth once daily 6)  Flexeril 10 Mg Tabs (Cyclobenzaprine hcl) .Marland Kitchen.. 1 by mouth three times a day as needed 7)  Requip 0.25 Mg Tabs (Ropinirole hcl) .Marland Kitchen.. 1 by mouth at bedtime for 3 days then 2 by mouth once daily   Orders Added: 1)  Venipuncture [36415] 2)  TLB-Lipid Panel [80061-LIPID] 3)  TLB-BMP (Basic Metabolic Panel-BMET) [80048-METABOL] 4)  TLB-CBC Platelet - w/Differential [85025-CBCD] 5)  TLB-Hepatic/Liver Function Pnl [80076-HEPATIC] 6)  TLB-TSH (Thyroid Stimulating Hormone) [84443-TSH] 7)  TLB-B12 + Folate Pnl [82746_82607-B12/FOL] 8)  T-Vitamin D (25-Hydroxy) [62952-84132] 9)  UA Dipstick w/o Micro (manual) [81002] 10)  Est. Patient 40-64 years [99396] 11)  EKG w/ Interpretation [93000]    Last Flu Vaccine:  Fluvax 3+ (12/11/2007 9:13:18 AM) Flu Vaccine Result Date:  11/03/2009 Flu Vaccine Result:  given Flu Vaccine Next Due:  1 yr Last PAP:  Normal (04/03/2007 9:11:18 AM) PAP Result Date:  06/09/2009  PAP Result:  normal PAP Next Due:  1 yr    Laboratory Results   Urine Tests   Date/Time Reported: January 07, 2010 11:11 AM   Routine Urinalysis   Color: yellow Appearance: Clear Glucose: negative   (Normal Range: Negative) Bilirubin: negative   (Normal Range: Negative) Ketone: negative   (Normal Range: Negative) Spec. Gravity: <1.005   (Normal Range: 1.003-1.035) Blood: negative   (Normal Range: Negative) pH: 7.5    (Normal Range: 5.0-8.0) Protein: negative   (Normal Range: Negative) Urobilinogen: negative   (Normal Range: 0-1) Nitrite: negative   (Normal Range: Negative) Leukocyte Esterace: negative   (Normal Range: Negative)    Comments: Floydene Flock  January 07, 2010 11:11 AM

## 2010-02-24 NOTE — Letter (Signed)
Summary: Beather Arbour MD  Beather Arbour MD   Imported By: Lanelle Bal 06/10/2009 13:51:57  _____________________________________________________________________  External Attachment:    Type:   Image     Comment:   External Document

## 2010-02-24 NOTE — Assessment & Plan Note (Signed)
Summary: COMPLETE FORM//CCM   Vital Signs:  Patient profile:   45 year old female Weight:      142 pounds Temp:     98.2 degrees F oral Pulse rate:   78 / minute Pulse rhythm:   regular BP sitting:   120 / 80  (left arm) Cuff size:   regular  Vitals Entered By: Army Fossa CMA (January 29, 2009 3:28 PM) CC: fill out forms, recheck urine(pt states feeling better)    History of Present Illness: Pt here to have forms filled out so that he does not have to take vacation days to see DR.  no complaints.   Current Medications (verified): 1)  Wellbutrin 100 Mg  Tabs (Bupropion Hcl) .... 3 By Mouth Qd 2)  Promethazine Hcl 25 Mg  Tabs (Promethazine Hcl) .... As Needed 3)  Trazodone Hcl 100 Mg  Tabs (Trazodone Hcl) .... Take One Tablet Daily 4)  Singulair 10 Mg  Tabs (Montelukast Sodium) .... Take One Tablet 5)  Prozac 40 Mg Caps (Fluoxetine Hcl) .Marland Kitchen.. 1 By Mouth Once Daily 6)  Aciphex 20 Mg Tbec (Rabeprazole Sodium) .Marland Kitchen.. 1 By Mouth Once Daily 7)  Flexeril 10 Mg Tabs (Cyclobenzaprine Hcl) .Marland Kitchen.. 1 By Mouth Three Times A Day As Needed 8)  Requip 0.25 Mg Tabs (Ropinirole Hcl) .Marland Kitchen.. 1 By Mouth At Bedtime For 3 Days Then 2 By Mouth Once Daily  Allergies: 1)  ! Penicillin  Past History:  Past medical, surgical, family and social histories (including risk factors) reviewed for relevance to current acute and chronic problems.  Past Medical History: Reviewed history from 02/19/2007 and no changes required. Allergic rhinitis GERD Depression  Past Surgical History: Reviewed history from 02/19/2007 and no changes required. Gastric Bypass Hysterectomy-fibroid  Family History: Reviewed history from 02/27/2008 and no changes required. Family History Depression Family History Diabetes 1st degree relative Family History Hypertension Family History Liver disease Family History Lung cancer Family History of Prostate CA 1st degree relative <50  Social History: Reviewed history from  02/27/2008 and no changes required. Occupation:  [employer] Married Former Smoker Alcohol use-yes Drug use-no Regular exercise-yes  Review of Systems      See HPI  Physical Exam  General:  Well-developed,well-nourished,in no acute distress; alert,appropriate and cooperative throughout examination Psych:  Cognition and judgment appear intact. Alert and cooperative with normal attention span and concentration. No apparent delusions, illusions, hallucinations   Impression & Recommendations:  Problem # 1:  BARIATRIC SURGERY STATUS (ICD-V45.86) forms filled out  Complete Medication List: 1)  Wellbutrin 100 Mg Tabs (Bupropion hcl) .... 3 by mouth qd 2)  Promethazine Hcl 25 Mg Tabs (Promethazine hcl) .... As needed 3)  Trazodone Hcl 100 Mg Tabs (Trazodone hcl) .... Take one tablet daily 4)  Singulair 10 Mg Tabs (Montelukast sodium) .... Take one tablet 5)  Prozac 40 Mg Caps (Fluoxetine hcl) .Marland Kitchen.. 1 by mouth once daily 6)  Aciphex 20 Mg Tbec (Rabeprazole sodium) .Marland Kitchen.. 1 by mouth once daily 7)  Flexeril 10 Mg Tabs (Cyclobenzaprine hcl) .Marland Kitchen.. 1 by mouth three times a day as needed 8)  Requip 0.25 Mg Tabs (Ropinirole hcl) .Marland Kitchen.. 1 by mouth at bedtime for 3 days then 2 by mouth once daily  Other Orders: Venipuncture (16109)  vc     Laboratory Results   Urine Tests    Routine Urinalysis   Color: yellow Appearance: Clear Glucose: negative   (Normal Range: Negative) Bilirubin: negative   (Normal Range: Negative) Ketone: negative   (  Normal Range: Negative) Spec. Gravity: 1.015   (Normal Range: 1.003-1.035) Blood: negative   (Normal Range: Negative) pH: 6.0   (Normal Range: 5.0-8.0) Protein: negative   (Normal Range: Negative) Urobilinogen: 0.2   (Normal Range: 0-1) Nitrite: negative   (Normal Range: Negative) Leukocyte Esterace: negative   (Normal Range: Negative)    Comments: Army Fossa CMA  January 29, 2009 3:38 PM

## 2010-02-24 NOTE — Progress Notes (Signed)
Summary: copy labs and referral  Phone Note Call from Patient Call back at Work Phone [phone]   Caller: Patient Summary of Call: pt left VM that she had some question about test and would like a call back to discuss. left message to call office.............Marland KitchenFelecia Deloach CMA  February 23, 2009 5:09 PM   Follow-up for Phone Call        pt requesting copy of all labs and referral done in 2010. copy placed up front for pt to pick-up and date of service highlighted for pt. left pt detail message ready for pick-up..............Marland KitchenFelecia Deloach CMA  February 24, 2009 9:17 AM  procedure perform

## 2010-02-24 NOTE — Progress Notes (Signed)
Summary: Lab Results [facility] 1/3)  Phone Note Outgoing Call   Call placed by: Army Fossa CMA,  January 25, 2009 10:21 AM Summary of Call: Regarding lab results, LMTCB:  +UTI---treated with UTI----call if symptoms do not improve  Initial call taken by: Army Fossa CMA,  January 25, 2009 10:22 AM  Follow-up for Phone Call        pt aware.  Follow-up by: Army Fossa CMA,  January 25, 2009 4:48 PM

## 2010-03-01 ENCOUNTER — Telehealth (INDEPENDENT_AMBULATORY_CARE_PROVIDER_SITE_OTHER): Payer: Self-pay | Admitting: *Deleted

## 2010-03-10 NOTE — Progress Notes (Signed)
Summary: I-fob not returned  Phone Note Outgoing Call   Call placed by: Almeta Monas CMA Duncan Dull),  March 01, 2010 3:53 PM Call placed to: Patient Details for Reason: I-fob not returned and patient will be charged Summary of Call: Left message to call back.... Almeta Monas CMA Duncan Dull)  March 01, 2010 3:53 PM  PT    Follow-up for Phone Call        Spoke with Pt she is unsure were IFOB is will place another up front for Pt to complete.Marland KitchenMarland KitchenFelecia Deloach CMA  March 02, 2010 2:29 PM

## 2010-04-22 ENCOUNTER — Other Ambulatory Visit: Payer: Self-pay

## 2010-04-22 DIAGNOSIS — Z1211 Encounter for screening for malignant neoplasm of colon: Secondary | ICD-10-CM

## 2010-04-26 ENCOUNTER — Encounter: Payer: Self-pay | Admitting: Family

## 2010-04-26 ENCOUNTER — Ambulatory Visit (HOSPITAL_BASED_OUTPATIENT_CLINIC_OR_DEPARTMENT_OTHER)
Admission: RE | Admit: 2010-04-26 | Discharge: 2010-04-26 | Disposition: A | Discharge status: Home / Self Care | Payer: BC Managed Care – PPO | Source: Ambulatory Visit | Attending: Family | Admitting: Family

## 2010-04-26 ENCOUNTER — Ambulatory Visit (INDEPENDENT_AMBULATORY_CARE_PROVIDER_SITE_OTHER): Payer: BC Managed Care – PPO | Admitting: Family

## 2010-04-26 VITALS — BP 94/62 | HR 64 | Resp 16 | Ht 61.0 in | Wt 140.0 lb

## 2010-04-26 DIAGNOSIS — R1011 Right upper quadrant pain: Secondary | ICD-10-CM | POA: Insufficient documentation

## 2010-04-26 DIAGNOSIS — R109 Unspecified abdominal pain: Secondary | ICD-10-CM | POA: Insufficient documentation

## 2010-04-26 DIAGNOSIS — J329 Chronic sinusitis, unspecified: Secondary | ICD-10-CM | POA: Insufficient documentation

## 2010-04-26 MED ORDER — CLARITHROMYCIN 500 MG PO TABS: 500.0000 mg | ORAL_TABLET | Freq: Two times a day (BID) | ORAL | Status: AC

## 2010-04-26 MED ORDER — IOHEXOL 300 MG/ML  SOLN
100.0000 mL | Freq: Once | INTRAMUSCULAR | Status: AC | PRN
  Administered 2010-04-26: 100 mL via INTRAVENOUS

## 2010-04-26 MED ORDER — CLARITHROMYCIN 500 MG PO TABS: 500.0000 mg | ORAL_TABLET | Freq: Two times a day (BID) | ORAL | Status: DC

## 2010-04-26 NOTE — Progress Notes (Signed)
  Subjective:    Patient ID: [name], female    DOB: 1965/09/25, 45 y.o.   MRN: [id number]  HPI  Ms.  Mullet is a 68 yr ofd female who presents today with two complaints:  1)sinus drainage-  + associated hoarseness, and sore throat.  Symptoms started greater than 1 week ago.  Sometimes nasal dicharge is bloody- other times it is yellow and thick.  Notes that she sometimes has pain in her cheeks and beneath her ears.  She states home 2 days last week due to drainage and associated stomach pain.  Used OTC meds without improvement.   Denies associated fever, but did have chills one days last week.  Notes energy is poor.  2) Abdominal discomfort.  Notes that certain positions cause epigastric pain which started Sunday evening.  +nausea, no vomitting.  No hx of cholecystectomy.  Notes that she had an extensive work up in the past which was negative for GB disease.  Co-worker also has stomach pain.  Review of Systems See HPI    Objective:   Physical Exam  Constitutional: She appears well-developed and well-nourished. No distress.  HENT:  Head: Atraumatic.       TM's- Bilateral serous effusions without significant erythema  Eyes: Conjunctivae are normal.  Cardiovascular: Normal rate and regular rhythm.   Pulmonary/Chest: Effort normal and breath sounds normal.  Abdominal: Bowel sounds are normal. She exhibits no distension and no mass. There is tenderness. There is guarding.       Diffuse tenderness but worst in the RLQ.  Skin:       Multiple tattoos  Psychiatric: She has a normal mood and affect.          Assessment & Plan:

## 2010-04-26 NOTE — Assessment & Plan Note (Signed)
Suspect + allergic rhinitis- now turned into a sinus infection.  Will treat with clarithromycin and will add once daily claritin.

## 2010-04-26 NOTE — Patient Instructions (Signed)
Please complete your ct scan on the first floor this evening.

## 2010-04-26 NOTE — Assessment & Plan Note (Signed)
45 yr old female with acute abdomen on exam.  Will refer directly downstairs for CT abdomen and pelvis.  Check stat labs as below.

## 2010-04-27 ENCOUNTER — Encounter: Payer: Self-pay | Admitting: Family

## 2010-04-27 LAB — CBC WITH DIFFERENTIAL/PLATELET
Basophils Absolute: 0 10*3/uL (ref 0.0–0.1)
Basophils Relative: 1 % (ref 0–1)
Eosinophils Absolute: 0.1 10*3/uL (ref 0.0–0.7)
MCH: 27.8 pg (ref 26.0–34.0)
MCHC: 32 g/dL (ref 30.0–36.0)
Neutro Abs: 4 10*3/uL (ref 1.7–7.7)
Neutrophils Relative %: 59 % (ref 43–77)
Platelets: 254 10*3/uL (ref 150–400)

## 2010-04-27 LAB — HEPATIC FUNCTION PANEL
Albumin: 4.3 g/dL (ref 3.5–5.2)
Total Bilirubin: 0.3 mg/dL (ref 0.3–1.2)

## 2010-04-27 LAB — LIPASE: Lipase: 47 U/L (ref 0–75)

## 2010-04-29 ENCOUNTER — Ambulatory Visit (HOSPITAL_BASED_OUTPATIENT_CLINIC_OR_DEPARTMENT_OTHER): Payer: BC Managed Care – PPO

## 2010-06-07 NOTE — Op Note (Signed)
[name], [name]               ACCOUNT NO.:  [id number]   MEDICAL RECORD NO.:  [id number]          PATIENT TYPE:  AMB   LOCATION:  [facility]                          FACILITY:  [facility]   PHYSICIAN:  [name], M.D.DATE OF BIRTH:  02/18/1965   DATE OF PROCEDURE:  07/31/2006  DATE OF DISCHARGE:                               OPERATIVE REPORT   ADDENDUM TO DICTATION #[id number]:   SURGEON:  [name], M.D.   TOTAL AMOUNTS OF BREAST TISSUE REMOVED:  Right breast, 626 g; left  breast, 654 g.           ______________________________  [name], M.D.     MC/MEDQ  D:  07/31/2006  T:  08/01/2006  Job:  [id number]

## 2010-06-07 NOTE — Op Note (Signed)
[name], [name]               ACCOUNT NO.:  [id number]   MEDICAL RECORD NO.:  [id number]          PATIENT TYPE:  AMB   LOCATION:  DSC                          FACILITY:  [facility]   PHYSICIAN:  Brantley Persons, M.D.DATE OF BIRTH:  October 30, 1965   DATE OF PROCEDURE:  07/31/2006  DATE OF DISCHARGE:                               OPERATIVE REPORT   PREOPERATIVE DIAGNOSIS:  Bilateral macromastia.   POSTOPERATIVE DIAGNOSIS:  Bilateral macromastia.   PROCEDURE:  Bilateral reduction mammoplasties.   ATTENDING SURGEON:  Brantley Persons, MD   ANESTHESIA:  General endotracheal.   ANESTHESIOLOGIST:  Zenon Mayo, MD   BLOOD LOSS:  300 mL.   FLUID REPLACEMENT:  2200 mL of crystalloid.   URINE OUTPUT:  250 mL.   COMPLICATIONS:  None.   JUSTIFICATION FOR OVERNIGHT STAY:  Progressive pain control along with  ambulation and monitoring of the nipples and breast flaps.   INDICATIONS FOR PROCEDURE:  The patient is a 45 year old Caucasian  female who has bilateral macromastia that is clinically symptomatic.  She presents to undergo bilateral reduction mammoplasties.   PROCEDURE:  The patient was marked in preop holding area for the future  bilateral reduction mammoplasties in the pattern of Wise.  She  additionally has accessory breast tissue present along the lateral  breast/chest and axillary areas, so these were marked to be removed with  liposuction instead of to be directly removed surgically so that we  could limit some of her incisions in these areas.  The pain was then  taken back to the OR and placed sitting up on the table.  The back and  axillary areas were prepped with Betadine and alcohol.  She was draped,  laid down on sterile drapes.  She was then covered with sterile sheets  and general endotracheal anesthesia was obtained.  Next the breasts and  chest were prepped with Betadine and alcohol and draped in sterile  fashion.  The bases of the breasts were then  injected 1% lidocaine with  epinephrine.  After adequate hemostasis and anesthesia had taken effect,  the procedure was begun.   Both of the breast reductions were performed in the following similar  manner.  Tumescent fluid in the amount of 400 mL was injected into the  lateral breast/chest and axillary areas bilaterally prior to performing  the surgical reduction so that it could take effect while the reduction  was be performed.  Next the nipple-areolar complex was marked with the  45 mm nipple marker.  This was then incised and the skin de-  epithelialized around the nipple-areolar complex in inferior pedicle  pattern.  Next the medial, superior and lateral skin flaps were elevated  down to the chest wall.  The excess fat and glandular tissue removed  from the inferior pedicle.  The nipple-areolar complex was then examined  and found to be pink and viable.  At this point power-assisted  liposuction was performed of the lateral breast/chest and axillary areas  removing the accessory breast tissue in these areas.  The total amount  of volume is as noted on the accompanying nursing  notes and will be  included as part of the breast specimen.  The wound was irrigated with  saline irrigation.  Meticulous hemostasis was obtained with the Bovie  electrocautery.  The inferior pedicle was centralized using 3-0 Prolene  suture.  The #10 JP flat, fully-fluted drains were placed into the  wound.  The skin flaps were brought together at the inverted T junction  with a 2-0 Prolene suture.  The incisions were then stapled for  temporary closure.   The breasts were then compared and found to have good shape and  symmetry.  The staples were then removed and the [facility] incision was closed  first by loosely tacking a few sutures in the dermal layer with 3-0  Monocryl and then from the medial aspect of the JP drain to the medial  aspect of the IMC incision.  The dermal and cuticular layers were closed  in  a single layer using the Quill 2-0 PDO barbed suture.  Lateral to the  JP drain, this incision was closed using 3-0 Monocryl in the dermal  layer, followed by 3-0 Monocryl running intracuticular stitch on the  skin.  The dermal layer of the vertical limb scar was also closed using  3-0 Monocryl suture.   The patient was then placed in the upright position.  The future  location of the nipple-areolar complexes was marked on both breast  mounds.  She was then placed back into the recumbent position.  The 42-  mm nipple marker had been used to mark the nipple-areolar complexes.   Both of the nipple-areolar complexes were then brought out onto the  breast mound in the following similar manner.  The skin was then incised  for the nipple-areolar opening and excised full thickness into the  subcutaneous tissues.  The nipple-areolar complex was then brought out  into this aperture and sewn in place using 4-0 Monocryl in the dermal  layer, followed by 5-0 Monocryl running intracuticular stitch on the  skin.  This 5-0 Monocryl was then brought down to close the cuticular  layer of the vertical limb as well.  The JP drain was sewn in place  using 3-0 nylon suture.  The incisions dressed with Benzoin and Steri-  Strips and the nipples additionally with bacitracin ointment and  Adaptic.  There were no complications.  The patient tolerated the  procedure well.  The final needle and sponge counts were reported to be  correct at the end of the case.  The patient was then awakened from the  general anesthesia and taken to the recovery room in stable condition.  She will remain overnight in the [facility] for progressive pain control along  with ambulation and monitoring of the nipples and breast flaps.  Discharge is planned for the morning.           ______________________________  Brantley Persons, M.D.     MC/MEDQ  D:  07/31/2006  T:  08/01/2006  Job:  [id number]

## 2010-06-10 NOTE — Op Note (Signed)
[facility]  Patient:    [name], [name] Visit Number: [id number] MRN: [id number]          Service Type: DSU Location: DAY Attending Physician:  Carson Myrtle Dictated by:   Sheppard Plumber Earlene Plater, M.D. Proc. Date: 04/11/01 Admit Date:  04/11/2001                             Operative Report  PREOPERATIVE DIAGNOSIS:  Chronic anal fissure.  POSTOPERATIVE DIAGNOSIS:  Chronic anal fissure.  PROCEDURE:  Repair of anal fissure.  SURGEON:  Timothy E. Earlene Plater, M.D.  ANESTHESIA:  General.  INDICATIONS FOR PROCEDURE:  [name] has been followed in the office with conservative management and has failed healing of the fissure and after careful explanation, she wishes to proceed with surgery.  DESCRIPTION OF PROCEDURE:  The patient was brought to the operating room, placed supine, general endotracheal anesthesia administered. She was placed in lithotomy, perianal area inspected, prepped and draped in the usual sterile fashion. Digital and anoscopic exam revealed only an anterior fissure and a small anterior sentinel tag. The anus was injected around and about with Marcaine 0.25% with epinephrine. A left posterior internal sphincterotomy accomplished percutaneously with a 15 blade without injury or complication. This allowed for gentle stretch of the anus. The anoscope placed, the skin tag removed, and the fissure in the base of the tag cauterized. This completed the repair, Gelfoam gauze and a dry sterile dressing applied. She tolerated it well and was removed to the recovery room in good condition.  Written and verbal instructions were given to her and husband including Percocet #30 and she will be followed as an outpatient. Dictated by:   Sheppard Plumber Earlene Plater, M.D. Attending Physician:  Carson Myrtle DD:  04/11/01 TD:  04/12/01 Job: [id number]

## 2010-06-10 NOTE — Discharge Summary (Signed)
[name], [name]               ACCOUNT NO.:  [id number]   MEDICAL RECORD NO.:  [id number]          PATIENT TYPE:  INP   LOCATION:  1521                         FACILITY:  [facility]   PHYSICIAN:  Thornton Park. Daphine Deutscher, MD  DATE OF BIRTH:  October 06, 1965   DATE OF ADMISSION:  11/20/2005  DATE OF DISCHARGE:  11/23/2005                               DISCHARGE SUMMARY   ADMITTING DIAGNOSIS:  Morbid obesity, body mass index 46.   DISCHARGE DIAGNOSIS:  Morbid obesity, body mass index 46, status post  laparoscopic Roux-en-Y gastric bypass.   COURSE IN THE HOSPITAL:  [name] is a 45 year old lady who on the  day of admission underwent the above-mentioned operation.  Postoperatively, she had an upper GI swallow which was negative, and she  had a DVT study which was negative, as well.  She was begun on liquids  and advanced and had some nausea on November 22, 2005, and was ready for  discharge on November 23, 2005, taking liquids at that time.  She was  given Roxicet elixir, and she was scheduled for follow-up visit the  first week in the office, having been coordinated with our  nurse  coordinator, Maryfrances Bunnell.   FINAL DIAGNOSIS:  Morbid obesity status post laparoscopic Roux-en-Y  gastric bypass.      Thornton Park Daphine Deutscher, MD  Electronically Signed     MBM/MEDQ  D:  12/26/2005  T:  12/27/2005  Job:  [id number]   cc:   Mosetta Putt, M.D.  Fax: [phone]

## 2010-06-10 NOTE — Op Note (Signed)
[name], [name]               ACCOUNT NO.:  [id number]   MEDICAL RECORD NO.:  [id number]          PATIENT TYPE:  INP   LOCATION:  0154                         FACILITY:  [facility]   PHYSICIAN:  Sharlet Salina T. Hoxworth, M.D.DATE OF BIRTH:  [birth year]   DATE OF PROCEDURE:  11/20/2005  DATE OF DISCHARGE:                               OPERATIVE REPORT   PROCEDURE:  Upper GI endoscopy.   DESCRIPTION OF PROCEDURE:  Upper GI endoscopy is performed on this  patient at completion of laparoscopic Roux-en-Y gastric bypass by Dr.  Luretha Murphy.  The Olympus video endoscope was inserted into the upper  esophagus and then passed under direct vision to the EG junction at 36  cm.  The small gastric pouch was entered.  With Dr. Daphine Deutscher clamping the  outlet of the pouch it was tensely distended with air and under saline  there was no evidence of leak.  Staple and suture lines appeared intact  without bleeding.  Anastomosis was patent.  No mucosal abnormalities.  The pouch was measured 4 cm in length.  Following completion, the pouch  was desufflated and the scope withdrawn without difficulty.      Lorne Skeens. Hoxworth, M.D.  Electronically Signed     BTH/MEDQ  D:  11/20/2005  T:  11/21/2005  Job:  [id number]

## 2010-06-10 NOTE — Op Note (Signed)
[name], [name]               ACCOUNT NO.:  [id number]   MEDICAL RECORD NO.:  [id number]          PATIENT TYPE:  OBV   LOCATION:  0442                         FACILITY:  [facility]   PHYSICIAN:  Gretta Cool, M.D. DATE OF BIRTH:  [birth year]   DATE OF PROCEDURE:  11/02/2003  DATE OF DISCHARGE:  11/03/2003                                 OPERATIVE REPORT   PREOPERATIVE DIAGNOSIS:  Incapacitating cyclic pelvic pain.   POSTOPERATIVE DIAGNOSIS:  Incapacitating cyclic pelvic pain.  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   SURGEON:  Gretta Cool, M.D.   ASSISTANT:  Raynald Kemp, M.D.   ANESTHESIA:  General orotracheal.   PROCEDURE:  Exam under anesthesia.  Supracervical hysterectomy.   DESCRIPTION OF PROCEDURE:  Under excellent general anesthesia with the  patient prepped and draped in supine position, a Pfannenstiel incision was  made and extended through the fascia.  The peritoneum was then opened and  the abdomen explored.  There were no abnormalities in the upper abdomen.  The fallopian tube cautery had extended into the cornual area of the uterus,  but there was no evidence of active endometriosis or significant pelvic  pathology otherwise.  Both ovaries appeared polycystic with rare evidence of  ovulation.  At this point, the uterus was grasped with [facility] clamps.  The  round ligaments were then sutured and transected with 0 Vicryl.  The  anterior leaf of the broad ligament was then opened and pushed off of the  lower segment.  The ovarian ligament on each side was then clamped, cut,  sutured, and doubly ligated with a second free tie of 0 Vicryl.  The uterine  vessels were then skeletonized, clamped, cut, sutured, and tied with 0  Vicryl.  The cervix was then incised, and a conical incision made so as to  remove virtually all of the endocervical canal.  The greatest portion of the  endocervical fascia was preserved.  At this point, the  cervix was  approximated with a running suture of 0 Vicryl from the uterine vessels from  left to right.  At this point, the cervical bleeding was well controlled,  and the endopelvic fascia  and cervical fascia completely plicated with  excellent residual support.  At this point, the pelvic peritoneum was closed  with a running suture of 2-0 Monocryl.  The pelvis was then irrigated.  Packs and retractors were removed.  The abdominal peritoneum was then closed  with a running suture of 0 Monocryl.  The rectus fascia was then  approximated with a running suture of 0 Vicryl from each angle to the  midline.  The subcutaneous tissue was then approximated with interrupted  sutures of 3-0 Vicryl.  The skin was approximated with skin staples and  Steri-Strips.  At the end of the procedure, sponge and lap counts were  correct with no complications.  Patient was returned to the recovery room in  excellent condition.     CWL/MEDQ  D:  11/04/2003  T:  11/04/2003  Job:  [id number]

## 2010-06-10 NOTE — H&P (Signed)
NAMEHOLLIE, [name]               ACCOUNT NO.:  [id number]   MEDICAL RECORD NO.:  [id number]          PATIENT TYPE:  AMB   LOCATION:  DAY                          FACILITY:  [facility]   PHYSICIAN:  Gretta Cool, M.D. DATE OF BIRTH:  Jun 11, 1965   DATE OF ADMISSION:  11/02/2003  DATE OF DISCHARGE:                                HISTORY & PHYSICAL   CHIEF COMPLAINT:  Cyclic pelvic pain.   HISTORY OF PRESENT ILLNESS:  A 45 year old nulligravida under the primary  care of Dr. Duaine Dredge.  She has pain predominantly in the left lower quadrant  of her abdomen.  The pain is cyclic in nature and increases premenstrual and  is entirely relieved within a day or two after onset of menses.  Her pain  was recently controlled on Ortho 135, but she discontinued that medication  on her own and had rapid exacerbation of the discomfort again.  She has a  history of tubal sterilization by laparoscopic cautery type.  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  The onset of pain was  predominantly after the tubal sterilization procedure.   She also has a uterine fibroid that has grown very significantly off of  hormonal contraception.  It encroaches upon the cavity.  She is beginning to  have increasingly heavy menses, particularly the first day or two after  onset of menses.  Suspect that is related also to the encroaching fibroid.  She is now admitted for definitive therapy by diagnostic laparoscopy, LAVH,  possible BSO versus supracervical hysterectomy and bilateral salpingo-  oophorectomy.   PAST MEDICAL HISTORY:  Usual childhood diseases without sequelae.   MEDICAL ILLNESSES:  None known.   PREVIOUS HOSPITALIZATIONS:  1.  Tubal sterilization in 1999.  2.  Colonoscopy 2000.  3.  T&A as a child.   ALLERGIES:  1.  History of allergy to PENICILLIN.  2.  Intolerance to CODEINE.  3.  Possible LATEX  allergy.   FAMILY HISTORY:  Her father has hypertension.  Mom has elevated cholesterol.  Paternal grandfather with lung cancer, maternal grandfather with liver  cancer.  No other known familial tendencies.   PRESENT MEDICATIONS:  1.  Zoloft 50 mg daily.  2.  Intermittent Flexeril and Vicodin for pelvic pain.   SOCIAL HISTORY:  The patient is employed by [employer] as a Development worker, community.  Husband is employed by [employer].  They have no children and  desire none.  She has already had tubal sterilization as above.   REVIEW OF SYSTEMS:  HEENT:  Denies symptoms.  CARDIORESPIRATORY:  Denies  asthma, cough, bronchitis, shortness of breath.  GI/GU:  Denies frequency,  dysuria, change in bowel habits, food intolerance.   PHYSICAL EXAMINATION:  GENERAL:  Well-developed but rather massively obese  white female at 242, 5 feet 3 inches in height.  VITAL SIGNS:  Blood pressure 110/70.  HEENT:  Pupils are equal, react to light and accommodate.  Fundi benign.  Oropharynx clear.  NECK:  Supple without mass or thyroid enlargement.  CHEST:  Clear P&A.  HEART:  Regular rhythm without murmur or cardiac enlargement.  BREASTS:  Without mass, nodes, nipple discharge.  ABDOMEN:  Enormous abdominal panniculus without palpable mass or  organomegaly.  No abdominal tenderness to direct palpation.  PELVIC:  External genitalia normal female.  Vagina clean, rugose.  Cervix is  nulliparous in appearance and clean.  Uterus is upper limits of normal size,  moderately tender to manipulation.  Adnexa nonpalpable.  Rectovaginal exam  confirms.  EXTREMITIES:  Negative.  NEUROLOGIC:  Physiologic.   IMPRESSIONS:  1.  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  2.  History of tubal sterilization.  3.  History of irritable bowel syndrome.  4.  Sleep disorder.  5.  Chronic back pain.   PLAN:  Diagnostic laparoscopy with possible LAVH, possible  BSO versus  supracervical hysterectomy, possible salpingo-oophorectomy.  The patient  understands the risks, benefit, ratio, alternative therapies, all including  no therapy.  She is now admitted for definitive management.      CWL/MEDQ  D:  11/02/2003  T:  11/02/2003  Job:  [id number]   cc:   Mosetta Putt, M.D.  [address] [place]  [place] [address]  Fax: [phone]

## 2010-06-10 NOTE — Op Note (Signed)
[name], [name]               ACCOUNT NO.:  [id number]   MEDICAL RECORD NO.:  [id number]          PATIENT TYPE:  INP   LOCATION:  0010                         FACILITY:  [facility]   PHYSICIAN:  Thornton Park. Daphine Deutscher, MD  DATE OF BIRTH:  01-11-1966   DATE OF PROCEDURE:  11/20/2005  DATE OF DISCHARGE:                                 OPERATIVE REPORT   PREOPERATIVE DIAGNOSIS:  Morbid obesity, body mass index 46.   POSTOPERATIVE DIAGNOSIS:  Morbid obesity, body mass index 46.   PROCEDURE:  Laparoscopic Roux-en-Y gastric bypass, with 40 cm bilobed  pancreatic limb, and 100 cm Roux limb, antecolic candy cane left.   SURGEON:  Thornton Park. Daphine Deutscher, M.D.   ASSISTANT:  Sharlet Salina T. Hoxworth, M.D.   ANESTHESIA:  General.   DESCRIPTION OF PROCEDURE:  Roxanne D. Odonell is a 45 year old lady from  [place] who was brought to O.R. #1 and given general anesthesia.  The  abdomen was prepped with chlorhexidine and draped sterilely.  OptiVu  technique was used in the left upper quadrant to enter the abdomen without  difficulty, and then a total of six trocars were placed overall.  First, the  ligament of Treitz was identified, and I went 40 cm distal to that and  divided the bowel with the Endo-GIA Echelon white cartridge.  I then put a  Penrose drain on the Roux limb into that.  I then measured 100 cm or 1 meter  up and then created a side-to-side anastomosis, laying the bowel edges  together with stay suture, applying the GIA, and this was a little slightly  eccentrically located toward the mesenteric side on the Roux limb.  However,  this looked good. When I looked on the inside, there was no bleeding.  Common defect was closed from above and below with running 2-0 Vicryls.  Tisseel was applied.  An anti-obstruction stitch was placed between the Roux  limb and the biliopancreatic limb of silk.  The common defect was closed  with running 2-0 silk.   Next, we went up, and I had already placed a liver  retractor, and we created  a small pouch running first along the lesser curvature about 4 cm down, then  stapling across the stomach.  There was a little bleeder on the gastric  remnant which I oversewed with a figure-of-eight suture of 2-0 Vicryl.  Next, I completed the pouch with multiple firings. On the last firing, I got  the Echelon and mis-cut but did fire.  I completed the cut with my  Endoshears.  Both staple lines looked intact, and I put some Tisseel on  those subsequently.  Roux limb was brought up, and the Penrose was removed,  and a back row was sewn.  Common defects were created on the stomach and the  Roux limb side, and the Endo-GIA 40 was inserted and fired.  Common defect  was closed from either end with 2-0 Vicryl, and then a second layer of 2-0  Vicryl using a regular free needle free handed was sewn.   Peterson's defect was closed with a running 2-0  silk.   Dr. Johna Sheriff then endoscoped the patient, and I clamped off the efferent  limb, and we got a good look at the anastomosis which was patent.  No  bleeding, and no bubbles were seen on the outside, and no evidence of a  leak.  The stomach was then decompressed and the scope withdrawn.  Wound sites were  then injected with some Marcaine.  No bleeding was noted.  I then, after  applying some Tisseel over the anastomosis, withdrew the trocars, and the  patient was taken to the recovery room in satisfactory condition.      Thornton Park Daphine Deutscher, MD  Electronically Signed     MBM/MEDQ  D:  11/20/2005  T:  11/21/2005  Job:  [id number]   cc:   Mosetta Putt, M.D.  Fax: [phone]

## 2010-06-10 NOTE — Procedures (Signed)
[name], [name]               ACCOUNT NO.:  [id number]   MEDICAL RECORD NO.:  [id number]          PATIENT TYPE:  OUT   LOCATION:  SLEEP CENTER                 FACILITY:  [facility]   PHYSICIAN:  Clinton D. Maple Hudson, M.D. DATE OF BIRTH:  02-04-1965   DATE OF STUDY:  08/12/2004                              NOCTURNAL POLYSOMNOGRAM   REFERRING PHYSICIAN:  Thornton Park. Daphine Deutscher, MD.   INDICATION FOR STUDY:  Hypersomnia with sleep apnea.  Epworth  Sleepiness  Score 3/24, BMI 48.9, weight 270 pounds.   SLEEP ARCHITECTURE:  Total sleep time 353 minutes.  Sleep efficiency 90%.  Stage I 3%, stage II 38%, stages III and IV 22%, REM 37% of total sleep  time.  Sleep latency 25 minutes.  REM latency 94 minutes.  Awake after sleep  onset 15 minutes.  Arousal index 28.  No bedtime medication taken.   RESPIRATORY DATA:  Respiratory disturbance index (RDI, AHI) 24.1 obstructive  events per hour indicating moderate obstructive sleep apnea/hypopnea  syndrome.  There were 2 central apneas, 78 obstructive apneas, and 62  hypopneas.  The events were recorded while supine and on left side.  She  slept mostly supine.  REM RDI 43.  The technician reports there was  insufficient sleep at the beginning of the night to allow confirmation of  diagnosis before running out of time for CPAP titration.  CPAP split  protocol could not be followed on this study night.   CARDIAC DATA:  Normal sinus rhythm.   MOVEMENT/PARASOMNIA:  A total of 190 limb jerks were recorded of which 13  were associated with arousal or awakening for a periodic limb movement with  arousal index of 2.2 per hour which is minimally increased.  No bathroom  trips.   IMPRESSION/RECOMMENDATIONS:  1.  Moderate obstructive sleep apnea/hypopnea syndrome, RDI 24 per hour with      moderate snoring and oxygen desaturation to a nadir of 80%.  2.  Consider return for CPAP titration or evaluate for alternative therapy      as appropriate.  3.  Periodic limb  movement with arousal.      Clinton D. Maple Hudson, M.D.  Diplomat    CDY/MEDQ  D:  08/21/2004 13:22:27  T:  08/21/2004 18:04:15  Job:  [id number]   cc:   Thornton Park Daphine Deutscher, MD  [address], [address]  [place]  [place] [address]

## 2010-06-29 ENCOUNTER — Encounter: Payer: Self-pay | Admitting: Family

## 2010-06-29 ENCOUNTER — Ambulatory Visit: Payer: BC Managed Care – PPO | Admitting: Internal Medicine

## 2010-06-29 ENCOUNTER — Other Ambulatory Visit: Payer: Self-pay | Admitting: Gynecology

## 2010-06-29 ENCOUNTER — Ambulatory Visit (INDEPENDENT_AMBULATORY_CARE_PROVIDER_SITE_OTHER): Payer: BC Managed Care – PPO | Admitting: Family

## 2010-06-29 DIAGNOSIS — J329 Chronic sinusitis, unspecified: Secondary | ICD-10-CM

## 2010-06-29 MED ORDER — DOXYCYCLINE HYCLATE 100 MG PO CAPS: 100.0000 mg | ORAL_CAPSULE | Freq: Two times a day (BID) | ORAL | Status: AC

## 2010-06-29 MED ORDER — MOMETASONE FUROATE 50 MCG/ACT NA SUSP: 2.0000 | Freq: Every day | NASAL | Status: DC

## 2010-06-29 NOTE — Assessment & Plan Note (Signed)
Treat with doxycycline, and add nasonex. Recommended Delsym PRN cough

## 2010-06-29 NOTE — Patient Instructions (Signed)
Call if your symptoms worsen, or if they are not improved in 48-72 hours.

## 2010-06-29 NOTE — Progress Notes (Signed)
Subjective:    Patient ID: [name], female    DOB: 1965/11/28, 45 y.o.   MRN: [id number]  HPI  Ms.  [name] is a 45 yr old female who presents today with chief complaint of sinus pressure.  She has taken some sudafed with some relief.   She has some pain in the cheeks and above her eyes.  She denies fever.  Denies sore throat today, but does have some ear itching.    Review of Systems  See HPI  Past Medical History  Diagnosis Date  . Allergy   . GERD (gastroesophageal reflux disease)   . Depression     History   Social History  . Marital Status: Married    Spouse Name: N/A    Number of Children: N/A  . Years of Education: N/A   Occupational History  .      [employer]   Social History Main Topics  . Smoking status: Former Games developer  . Smokeless tobacco: Never Used  . Alcohol Use: Yes     occasional  . Drug Use: No  . Sexually Active: Not on file   Other Topics Concern  . Not on file   Social History Narrative   Regular exercise--yes    Past Surgical History  Procedure Date  . Abdominal hysterectomy     fibroids  . Gastric bypass     Family History  Problem Relation Age of Onset  . Hypertension Father   . Cancer Father     Prostate  . Depression Sister   . Depression Brother   . Diabetes Maternal Grandmother   . Diabetes Maternal Grandfather   . Liver disease Maternal Grandfather   . Cancer Maternal Grandfather     Prostate  . Diabetes Paternal Grandmother   . Diabetes Paternal Grandfather   . Cancer Paternal Grandfather     Lung, Prostate    Allergies  Allergen Reactions  . Penicillins     Current Outpatient Prescriptions on File Prior to Visit  Medication Sig Dispense Refill  . buPROPion (WELLBUTRIN) 100 MG tablet Take 2 tablets by mouth in AM, 2 tablets at lunch and 1 tablet in the evening       . Cholecalciferol (VITAMIN D) 2000 UNITS CAPS Take 1 capsule by mouth daily.        Marland Kitchen FERROUS SULFATE PO Take 1 tablet by mouth daily.         Marland Kitchen loratadine (CLARITIN) 10 MG tablet Take 10 mg by mouth daily.        . vitamin B-12 (CYANOCOBALAMIN) 1000 MCG tablet Take 1,000 mcg by mouth daily.          BP 112/78  Pulse 78  Temp(Src) 98.1 F (36.7 C) (Oral)  Resp 16  Wt 140 lb 1.3 oz (63.54 kg)  SpO2 100%       Objective:   Physical Exam  Constitutional: She appears well-developed and well-nourished.  HENT:  Head: Normocephalic.  Right Ear: Tympanic membrane normal.  Left Ear: Tympanic membrane normal.  Mouth/Throat: Uvula is midline, oropharynx is clear and moist and mucous membranes are normal.       + maxillary and frontal sinus pain to palpation.  Cardiovascular: Normal rate and regular rhythm.   Pulmonary/Chest: Effort normal and breath sounds normal. No respiratory distress. She has no wheezes. She has no rales. She exhibits no tenderness.          Assessment & Plan:  2 Maa 25 Jan 2012- nasonex  sample

## 2010-12-13 ENCOUNTER — Encounter: Payer: Self-pay | Admitting: Family Medicine

## 2010-12-20 ENCOUNTER — Ambulatory Visit (INDEPENDENT_AMBULATORY_CARE_PROVIDER_SITE_OTHER): Payer: BC Managed Care – PPO | Admitting: Family Medicine

## 2010-12-20 ENCOUNTER — Encounter: Payer: Self-pay | Admitting: Family Medicine

## 2010-12-20 VITALS — BP 116/68 | HR 75 | Temp 98.8°F | Ht 63.0 in | Wt 147.2 lb

## 2010-12-20 DIAGNOSIS — M62838 Other muscle spasm: Secondary | ICD-10-CM

## 2010-12-20 DIAGNOSIS — J40 Bronchitis, not specified as acute or chronic: Secondary | ICD-10-CM

## 2010-12-20 DIAGNOSIS — Z0000 Encounter for general adult medical examination without abnormal findings: Secondary | ICD-10-CM

## 2010-12-20 DIAGNOSIS — J069 Acute upper respiratory infection, unspecified: Secondary | ICD-10-CM

## 2010-12-20 MED ORDER — CYCLOBENZAPRINE HCL 10 MG PO TABS: 10.0000 mg | ORAL_TABLET | Freq: Three times a day (TID) | ORAL | Status: DC | PRN

## 2010-12-20 MED ORDER — AZELASTINE-FLUTICASONE 137-50 MCG/ACT NA SUSP: 1.0000 | Freq: Two times a day (BID) | NASAL | Status: DC

## 2010-12-20 MED ORDER — ROPINIROLE HCL 0.25 MG PO TABS: 0.2500 mg | ORAL_TABLET | Freq: Three times a day (TID) | ORAL | Status: DC

## 2010-12-20 MED ORDER — AZITHROMYCIN 250 MG PO TABS: ORAL_TABLET | ORAL | Status: AC

## 2010-12-20 MED ORDER — GUAIFENESIN-CODEINE 100-10 MG/5ML PO SYRP: ORAL_SOLUTION | ORAL | Status: DC

## 2010-12-20 NOTE — Patient Instructions (Signed)

## 2010-12-20 NOTE — Progress Notes (Signed)
Subjective:     [name] is a 45 y.o. female and is here for a comprehensive physical exam. The patient reports URI symptoms since Thursday night.   + nasal congestion,  + productive cough and sore throat.   OTC meds taken with no relief.    History   Social History  . Marital Status: Married    Spouse Name: N/A    Number of Children: N/A  . Years of Education: N/A   Occupational History  .      [employer]   Social History Main Topics  . Smoking status: Former Games developer  . Smokeless tobacco: Never Used  . Alcohol Use: Yes     occasional  . Drug Use: No  . Sexually Active: Not Currently   Other Topics Concern  . Not on file   Social History Narrative   Regular exercise--yes   Health Maintenance  Topic Date Due  . Influenza Vaccine  10/24/2011  . Pap Smear  06/28/2013  . Tetanus/tdap  02/26/2018    The following portions of the patient's history were reviewed and updated as appropriate: allergies, current medications, past family history, past medical history, past social history, past surgical history and problem list.  Review of Systems Review of Systems  Constitutional: Negative for activity change, appetite change and fatigue.  HENT: Negative for hearing loss, congestion, tinnitus and ear discharge.  dentist q52m Eyes: Negative for visual disturbance (see optho q1y -- vision corrected to 20/20 with glasses).  Respiratory: Negative for cough, chest tightness and shortness of breath.   Cardiovascular: Negative for chest pain, palpitations and leg swelling.  Gastrointestinal: Negative for abdominal pain, diarrhea, constipation and abdominal distention.  Genitourinary: Negative for urgency, frequency, decreased urine volume and difficulty urinating.  Musculoskeletal: Negative for back pain, arthralgias and gait problem.  Skin: Negative for color change, pallor and rash.  Neurological: Negative for dizziness, light-headedness, numbness and headaches.    Hematological: Negative for adenopathy. Does not bruise/bleed easily.  Psychiatric/Behavioral: Negative for suicidal ideas, confusion, sleep disturbance, self-injury, dysphoric mood, decreased concentration and agitation.       Objective:    BP 116/68  Pulse 75  Temp(Src) 98.8 F (37.1 C) (Oral)  Ht 5\' 3"  (1.6 m)  Wt 147 lb 3.2 oz (66.769 kg)  BMI 26.08 kg/m2  SpO2 96% General appearance: alert, cooperative, appears stated age and no distress Head: Normocephalic, without obvious abnormality, atraumatic Eyes: conjunctivae/corneas clear. PERRL, EOM's intact. Fundi benign. Ears: normal TM's and external ear canals both ears Nose: Nares normal. Septum midline. Mucosa normal. No drainage or sinus tenderness. Throat: lips, mucosa, and tongue normal; teeth and gums normal Neck: no adenopathy, no carotid bruit, no JVD, supple, symmetrical, trachea midline and thyroid not enlarged, symmetric, no tenderness/mass/nodules Back: symmetric, no curvature. ROM normal. No CVA tenderness. Lungs: clear to auscultation bilaterally Breasts: gyn Heart: regular rate and rhythm, S1, S2 normal, no murmur, click, rub or gallop Abdomen: soft, non-tender; bowel sounds normal; no masses,  no organomegaly Pelvic: gyn Extremities: extremities normal, atraumatic, no cyanosis or edema Pulses: 2+ and symmetric Skin: Skin color, texture, turgor normal. No rashes or lesions Lymph nodes: Cervical, supraclavicular, and axillary nodes normal. Neurologic: Alert and oriented X 3, normal strength and tone. Normal symmetric reflexes. Normal coordination and gait psych-- no depression/ anxiety symptoms   Assessment:    Healthy female exam.    depression--stable Plan:    ghm utd Check fasting labs  See After Visit Summary for Counseling Recommendations

## 2010-12-23 ENCOUNTER — Other Ambulatory Visit: Payer: BC Managed Care – PPO

## 2011-01-31 ENCOUNTER — Other Ambulatory Visit: Payer: Self-pay | Admitting: Family Medicine

## 2011-01-31 DIAGNOSIS — Z0000 Encounter for general adult medical examination without abnormal findings: Secondary | ICD-10-CM

## 2011-02-01 ENCOUNTER — Other Ambulatory Visit (INDEPENDENT_AMBULATORY_CARE_PROVIDER_SITE_OTHER): Payer: BC Managed Care – PPO

## 2011-02-01 DIAGNOSIS — Z0000 Encounter for general adult medical examination without abnormal findings: Secondary | ICD-10-CM

## 2011-02-01 LAB — BASIC METABOLIC PANEL
CO2: 26 mEq/L (ref 19–32)
Chloride: 112 mEq/L (ref 96–112)
Glucose, Bld: 80 mg/dL (ref 70–99)
Potassium: 4.4 mEq/L (ref 3.5–5.1)
Sodium: 142 mEq/L (ref 135–145)

## 2011-02-01 LAB — CBC WITH DIFFERENTIAL/PLATELET
Basophils Absolute: 0 10*3/uL (ref 0.0–0.1)
Eosinophils Absolute: 0.1 10*3/uL (ref 0.0–0.7)
HCT: 33.3 % — ABNORMAL LOW (ref 36.0–46.0)
Hemoglobin: 11.2 g/dL — ABNORMAL LOW (ref 12.0–15.0)
Lymphs Abs: 1.4 10*3/uL (ref 0.7–4.0)
MCHC: 33.8 g/dL (ref 30.0–36.0)
Neutro Abs: 1.8 10*3/uL (ref 1.4–7.7)
RDW: 15 % — ABNORMAL HIGH (ref 11.5–14.6)

## 2011-02-01 LAB — LIPID PANEL
HDL: 64.6 mg/dL (ref >39.00)
Total CHOL/HDL Ratio: 2
VLDL: 6.2 mg/dL (ref 0.0–40.0)

## 2011-02-01 LAB — HEPATIC FUNCTION PANEL: Total Bilirubin: 0.8 mg/dL (ref 0.3–1.2)

## 2011-03-22 ENCOUNTER — Telehealth: Payer: Self-pay | Admitting: Family Medicine

## 2011-03-22 DIAGNOSIS — D649 Anemia, unspecified: Secondary | ICD-10-CM

## 2011-03-22 NOTE — Telephone Encounter (Signed)
285.9 Cbcd, ibc, ferritin is due now. Please advise on the A1c--no indication for it. Please advise   KP

## 2011-03-22 NOTE — Telephone Encounter (Signed)
Patient would like to come in Friday Morning for her Iron/A1c check. If that is ok please let me know & I can call her back to schedule Thanks

## 2011-03-23 NOTE — Telephone Encounter (Signed)
Discussed with patient and she only needed her Iron panel done--apt scheduled and a work note was left at check in.    [place]

## 2011-03-23 NOTE — Telephone Encounter (Signed)
msg left to call the office     KP 

## 2011-03-23 NOTE — Telephone Encounter (Signed)
Why does she want a1c done?---glucose was normal last lab check

## 2011-03-24 ENCOUNTER — Other Ambulatory Visit (INDEPENDENT_AMBULATORY_CARE_PROVIDER_SITE_OTHER): Payer: BC Managed Care – PPO

## 2011-03-24 DIAGNOSIS — D649 Anemia, unspecified: Secondary | ICD-10-CM

## 2011-03-24 LAB — CBC WITH DIFFERENTIAL/PLATELET
Basophils Absolute: 0.1 10*3/uL (ref 0.0–0.1)
HCT: 35.1 % — ABNORMAL LOW (ref 36.0–46.0)
Hemoglobin: 11.6 g/dL — ABNORMAL LOW (ref 12.0–15.0)
Lymphs Abs: 1.4 10*3/uL (ref 0.7–4.0)
MCV: 86.9 fl (ref 78.0–100.0)
Monocytes Absolute: 0.3 10*3/uL (ref 0.1–1.0)
Neutro Abs: 1.9 10*3/uL (ref 1.4–7.7)
Platelets: 164 10*3/uL (ref 150.0–400.0)
RDW: 15.5 % — ABNORMAL HIGH (ref 11.5–14.6)

## 2011-03-24 LAB — IBC PANEL: Iron: 102 ug/dL (ref 42–145)

## 2011-03-24 LAB — FERRITIN: Ferritin: 4.2 ng/mL — ABNORMAL LOW (ref 10.0–291.0)

## 2011-04-14 ENCOUNTER — Ambulatory Visit (INDEPENDENT_AMBULATORY_CARE_PROVIDER_SITE_OTHER): Payer: BC Managed Care – PPO | Admitting: Internal Medicine

## 2011-04-14 ENCOUNTER — Encounter: Payer: Self-pay | Admitting: Internal Medicine

## 2011-04-14 VITALS — BP 100/72 | HR 87 | Temp 98.1°F | Resp 18

## 2011-04-14 DIAGNOSIS — J40 Bronchitis, not specified as acute or chronic: Secondary | ICD-10-CM

## 2011-04-14 MED ORDER — HYDROCOD POLST-CHLORPHEN POLST 10-8 MG/5ML PO LQCR: 5.0000 mL | Freq: Two times a day (BID) | ORAL | Status: DC | PRN

## 2011-04-14 MED ORDER — BENZONATATE 100 MG PO CAPS: 100.0000 mg | ORAL_CAPSULE | Freq: Three times a day (TID) | ORAL | Status: AC | PRN

## 2011-04-14 MED ORDER — DOXYCYCLINE HYCLATE 100 MG PO TABS: 100.0000 mg | ORAL_TABLET | Freq: Two times a day (BID) | ORAL | Status: AC

## 2011-04-14 NOTE — Progress Notes (Signed)
  Subjective:    Patient ID: [name], female    DOB: 1965/10/13, 46 y.o.   MRN: [id number]  HPI Pt presents to clinic for evaluation of cough. Notes 5+day h/o cough productive for green sputum with associated ear congestion and nasal drainage. Cough has recently become tinged with blood. No gross hemoptysis. No current tobacco hx. Cough impairs sleep at night. No alleviating or exacerbating factors. No other complaints.  Past Medical History  Diagnosis Date  . Allergy   . GERD (gastroesophageal reflux disease)   . Depression    Past Surgical History  Procedure Date  . Abdominal hysterectomy     fibroids  . Gastric bypass     reports that she has quit smoking. She has never used smokeless tobacco. She reports that she drinks alcohol. She reports that she does not use illicit drugs. family history includes Cancer in her father, maternal grandfather, and paternal grandfather; Depression in her brother and sister; Diabetes in her maternal grandfather, maternal grandmother, paternal grandfather, and paternal grandmother; Hypertension in her father; and Liver disease in her maternal grandfather. Allergies  Allergen Reactions  . Penicillins      Review of Systems see hpi     Objective:   Physical Exam  Nursing note and vitals reviewed. Constitutional: She appears well-developed and well-nourished. No distress.  HENT:  Head: Normocephalic and atraumatic.  Right Ear: Tympanic membrane, external ear and ear canal normal.  Left Ear: Tympanic membrane, external ear and ear canal normal.  Nose: Nose normal.  Mouth/Throat: Oropharynx is clear and moist. No oropharyngeal exudate.  Eyes: Conjunctivae are normal. Right eye exhibits no discharge. Left eye exhibits no discharge. No scleral icterus.  Neck: Neck supple.  Pulmonary/Chest: Effort normal and breath sounds normal. No respiratory distress. She has no wheezes. She has no rales.  Lymphadenopathy:    She has no cervical  adenopathy.  Neurological: She is alert.  Skin: Skin is warm and dry. She is not diaphoretic.  Psychiatric: She has a normal mood and affect.          Assessment & Plan:

## 2011-04-14 NOTE — Assessment & Plan Note (Signed)
Begin doxycycline course. Attempt tussionex prn cough at night and tessalon prn daytime. Followup if no improvement or worsening.

## 2011-05-01 ENCOUNTER — Other Ambulatory Visit (INDEPENDENT_AMBULATORY_CARE_PROVIDER_SITE_OTHER): Payer: BC Managed Care – PPO

## 2011-05-01 DIAGNOSIS — D649 Anemia, unspecified: Secondary | ICD-10-CM

## 2011-05-01 LAB — CBC WITH DIFFERENTIAL/PLATELET
Eosinophils Relative: 3.2 % (ref 0.0–5.0)
HCT: 36.8 % (ref 36.0–46.0)
Hemoglobin: 12.3 g/dL (ref 12.0–15.0)
Lymphs Abs: 1.4 10*3/uL (ref 0.7–4.0)
MCV: 88.4 fl (ref 78.0–100.0)
Monocytes Absolute: 0.2 10*3/uL (ref 0.1–1.0)
Monocytes Relative: 5.9 % (ref 3.0–12.0)
Neutro Abs: 2.3 10*3/uL (ref 1.4–7.7)
Platelets: 259 10*3/uL (ref 150.0–400.0)
RDW: 16.1 % — ABNORMAL HIGH (ref 11.5–14.6)
WBC: 4.1 10*3/uL — ABNORMAL LOW (ref 4.5–10.5)

## 2011-06-29 ENCOUNTER — Encounter: Payer: Self-pay | Admitting: Family Medicine

## 2011-06-29 ENCOUNTER — Ambulatory Visit (INDEPENDENT_AMBULATORY_CARE_PROVIDER_SITE_OTHER): Payer: BC Managed Care – PPO | Admitting: Family Medicine

## 2011-06-29 VITALS — BP 108/66 | HR 69 | Temp 98.7°F | Wt 143.2 lb

## 2011-06-29 DIAGNOSIS — M545 Low back pain, unspecified: Secondary | ICD-10-CM

## 2011-06-29 DIAGNOSIS — M25511 Pain in right shoulder: Secondary | ICD-10-CM

## 2011-06-29 DIAGNOSIS — M25519 Pain in unspecified shoulder: Secondary | ICD-10-CM

## 2011-06-29 MED ORDER — HYDROCODONE-ACETAMINOPHEN 5-500 MG PO TABS: 1.0000 | ORAL_TABLET | Freq: Four times a day (QID) | ORAL | Status: AC | PRN

## 2011-06-29 NOTE — Progress Notes (Signed)
  Subjective:    [name] is a 46 y.o. female who presents for evaluation of low back pain. The patient has had no prior back problems. Symptoms have been present for almost 2  weeks and are gradually worsening.  Onset was related to / precipitated by lifting a heavy object. The pain is located in the across the lower back and radiates to the left thigh. The pain is described as sharp and occurs all day. She rates her pain as a 6 on a scale of 0-10. Symptoms are exacerbated by sitting and standing. Symptoms are improved by heat and ice. She has also tried acetaminophen which provided no symptom relief. She has no other symptoms associated with the back pain. The patient has no "red flag" history indicative of complicated back pain.  The following portions of the patient's history were reviewed and updated as appropriate: allergies, current medications, past family history, past medical history, past social history, past surgical history and problem list.  Review of Systems Pertinent items are noted in HPI.    Objective:   Inspection and palpation: inspection of back is normal. Muscle tone and ROM exam: limited range of motion with pain, antalgic gait. Straight leg raise: positive at 45 degrees bilaterally. Neurological: normal DTRs, muscle strength and reflexes.  r shoulder-- pain with abduction past 90 degrees -- she can do it but it is painful  Assessment:    Nonspecific acute low back pain   r shoulder pain--- chronic---  Refer to ortho Plan:    Proper lifting, bending technique discussed. Stretching exercises discussed. Short (2-4 day) period of relative rest recommended until acute symptoms improve. Ice to affected area as needed for local pain relief. Heat to affected area as needed for local pain relief. Muscle relaxants per medication orders. Patient can return to regular work on 07/04/2011. rto prn

## 2011-06-29 NOTE — Patient Instructions (Addendum)
Back Pain, Adult Low back pain is very common. About 1 in 5 people have back pain.The cause of low back pain is rarely dangerous. The pain often gets better over time.About half of people with a sudden onset of back pain feel better in just 2 weeks. About 8 in 10 people feel better by 6 weeks.  CAUSES Some common causes of back pain include:  Strain of the muscles or ligaments supporting the spine.   Wear and tear (degeneration) of the spinal discs.   Arthritis.   Direct injury to the back.  DIAGNOSIS Most of the time, the direct cause of low back pain is not known.However, back pain can be treated effectively even when the exact cause of the pain is unknown.Answering your caregiver's questions about your overall health and symptoms is one of the most accurate ways to make sure the cause of your pain is not dangerous. If your caregiver needs more information, he or she may order lab work or imaging tests (X-rays or MRIs).However, even if imaging tests show changes in your back, this usually does not require surgery. HOME CARE INSTRUCTIONS For many people, back pain returns.Since low back pain is rarely dangerous, it is often a condition that people can learn to manageon their own.   Remain active. It is stressful on the back to sit or stand in one place. Do not sit, drive, or stand in one place for more than 30 minutes at a time. Take short walks on level surfaces as soon as pain allows.Try to increase the length of time you walk each day.   Do not stay in bed.Resting more than 1 or 2 days can delay your recovery.   Do not avoid exercise or work.Your body is made to move.It is not dangerous to be active, even though your back may hurt.Your back will likely heal faster if you return to being active before your pain is gone.   Pay attention to your body when you bend and lift. Many people have less discomfortwhen lifting if they bend their knees, keep the load close to their  bodies,and avoid twisting. Often, the most comfortable positions are those that put less stress on your recovering back.   Find a comfortable position to sleep. Use a firm mattress and lie on your side with your knees slightly bent. If you lie on your back, put a pillow under your knees.   Only take over-the-counter or prescription medicines as directed by your caregiver. Over-the-counter medicines to reduce pain and inflammation are often the most helpful.Your caregiver may prescribe muscle relaxant drugs.These medicines help dull your pain so you can more quickly return to your normal activities and healthy exercise.   Put ice on the injured area.   Put ice in a plastic bag.   Place a towel between your skin and the bag.   Leave the ice on for 15 to 20 minutes, 3 to 4 times a day for the first 2 to 3 days. After that, ice and heat may be alternated to reduce pain and spasms.   Ask your caregiver about trying back exercises and gentle massage. This may be of some benefit.   Avoid feeling anxious or stressed.Stress increases muscle tension and can worsen back pain.It is important to recognize when you are anxious or stressed and learn ways to manage it.Exercise is a great option.  SEEK MEDICAL CARE IF:  You have pain that is not relieved with rest or medicine.   You have   pain that does not improve in 1 week.   You have new symptoms.   You are generally not feeling well.  SEEK IMMEDIATE MEDICAL CARE IF:   You have pain that radiates from your back into your legs.   You develop new bowel or bladder control problems.   You have unusual weakness or numbness in your arms or legs.   You develop nausea or vomiting.   You develop abdominal pain.   You feel faint.  Document Released: 01/09/2005 Document Revised: 12/29/2010 Document Reviewed: 05/30/2010 [facility] Patient Information 2012 [place], [place].  Shoulder Pain The shoulder is a ball and socket joint. The muscles and  tendons (rotator cuff) are what keep the shoulder in its joint and stable. This collection of muscles and tendons holds in the head (ball) of the humerus (upper arm bone) in the fossa (cup) of the scapula (shoulder blade). Today no reason was found for your shoulder pain. Often pain in the shoulder may be treated conservatively with temporary immobilization. For example, holding the shoulder in one place using a sling for rest. Physical therapy may be needed if problems continue. HOME CARE INSTRUCTIONS   Apply ice to the sore area for 15 to 20 minutes, 3 to 4 times per day for the first 2 days. Put the ice in a plastic bag. Place a towel between the bag of ice and your skin.   If you have or were given a shoulder sling and straps, do not remove for as long as directed by your caregiver or until you see a caregiver for a follow-up examination. If you need to remove it to shower or bathe, move your arm as little as possible.   Sleep on several pillows at night to lessen swelling and pain.   Only take over-the-counter or prescription medicines for pain, discomfort, or fever as directed by your caregiver.   Keep any follow-up appointments in order to avoid any type of permanent shoulder disability or chronic pain problems.  SEEK MEDICAL CARE IF:   Pain in your shoulder increases or new pain develops in your arm, hand, or fingers.   Your hand or fingers are colder than your other hand.   You do not obtain pain relief with the medications or your pain becomes worse.  SEEK IMMEDIATE MEDICAL CARE IF:   Your arm, hand, or fingers are numb or tingling.   Your arm, hand, or fingers are swollen, painful, or turn white or blue.   You develop chest pain or shortness of breath.  MAKE SURE YOU:   Understand these instructions.   Will watch your condition.   Will get help right away if you are not doing well or get worse.  Document Released: 10/19/2004 Document Revised: 12/29/2010 Document Reviewed:  12/24/2010 [facility] Patient Information 2012 [place], [place].

## 2011-06-30 ENCOUNTER — Other Ambulatory Visit: Payer: Self-pay | Admitting: Gynecology

## 2011-06-30 DIAGNOSIS — Z1231 Encounter for screening mammogram for malignant neoplasm of breast: Secondary | ICD-10-CM

## 2011-06-30 DIAGNOSIS — Z9889 Other specified postprocedural states: Secondary | ICD-10-CM

## 2011-07-03 ENCOUNTER — Other Ambulatory Visit: Payer: Self-pay | Admitting: Family Medicine

## 2011-07-03 ENCOUNTER — Other Ambulatory Visit: Payer: Self-pay | Admitting: Gynecology

## 2011-07-03 ENCOUNTER — Ambulatory Visit
Admission: RE | Admit: 2011-07-03 | Discharge: 2011-07-03 | Disposition: A | Discharge status: Home / Self Care | Payer: BC Managed Care – PPO | Source: Ambulatory Visit | Attending: Gynecology | Admitting: Gynecology

## 2011-07-03 ENCOUNTER — Telehealth: Payer: Self-pay

## 2011-07-03 DIAGNOSIS — Z9889 Other specified postprocedural states: Secondary | ICD-10-CM

## 2011-07-03 DIAGNOSIS — Z1231 Encounter for screening mammogram for malignant neoplasm of breast: Secondary | ICD-10-CM

## 2011-07-03 DIAGNOSIS — M549 Dorsalgia, unspecified: Secondary | ICD-10-CM

## 2011-07-03 NOTE — Telephone Encounter (Signed)
Xray LS spine and give her note to remain out of work this week.

## 2011-07-03 NOTE — Telephone Encounter (Signed)
Patient aware--- note and FMLA faxed to Darryl Lent at [phone] copy of note mailed to the patient.    KP

## 2011-07-03 NOTE — Telephone Encounter (Signed)
Patient walked in and stated she was seen last week about her back and was told to call if her back was still hurting. She is scheduled to go back to work tomorrow but her back is still hurting and she wants to know what you would like for her to do. Patient is currently on Hydrocodone 5-500 for the pain and going to PT. Please advise    KP

## 2011-07-03 NOTE — Telephone Encounter (Signed)
msg left to call the office     KP 

## 2011-07-04 ENCOUNTER — Ambulatory Visit (HOSPITAL_BASED_OUTPATIENT_CLINIC_OR_DEPARTMENT_OTHER)
Admission: RE | Admit: 2011-07-04 | Discharge: 2011-07-04 | Disposition: A | Discharge status: Home / Self Care | Payer: BC Managed Care – PPO | Source: Ambulatory Visit | Attending: Family Medicine | Admitting: Family Medicine

## 2011-07-04 DIAGNOSIS — M549 Dorsalgia, unspecified: Secondary | ICD-10-CM

## 2011-07-04 DIAGNOSIS — M47817 Spondylosis without myelopathy or radiculopathy, lumbosacral region: Secondary | ICD-10-CM | POA: Insufficient documentation

## 2011-07-05 DIAGNOSIS — M549 Dorsalgia, unspecified: Secondary | ICD-10-CM

## 2011-07-06 ENCOUNTER — Telehealth: Payer: Self-pay

## 2011-07-07 NOTE — Telephone Encounter (Signed)
Results reviewed with the patient.     KP

## 2011-07-11 ENCOUNTER — Telehealth: Payer: Self-pay

## 2011-07-11 NOTE — Telephone Encounter (Signed)
Have PT send [place] something tomorrow after visit--so I know what recc are for work.  PT can't write a note because they are not physicians.

## 2011-07-11 NOTE — Telephone Encounter (Signed)
FYI--Spoke with patient and she stated she is doing the PT or her back and they advised that she should not be doing back to work doing her job for 12 hours. I made the patient aware if she needed a note to make adjustments to her job duties, then the Physical therapist would need to do so because they are the ones seeing her at this time, I made her aware we would have no idea what the plan was from PT, she is seeing them tomorrow for her back and then she has PT for her back and Shoulder for Monday and Wed for the next 3 weeks. She is concerned about going back to work on Friday because she said her employer may not let her work with restrictions. She wanted to make Dr.Lowne aware of what was going on just incase she needed any paper work complete.     KP

## 2011-07-12 ENCOUNTER — Ambulatory Visit: Payer: BC Managed Care – PPO | Admitting: Physical Therapy

## 2011-07-12 ENCOUNTER — Other Ambulatory Visit: Payer: Self-pay

## 2011-07-12 DIAGNOSIS — M549 Dorsalgia, unspecified: Secondary | ICD-10-CM

## 2011-07-12 NOTE — Telephone Encounter (Signed)
msg left advising patient to have PT send Office notes.     KP

## 2011-07-31 ENCOUNTER — Other Ambulatory Visit: Payer: Self-pay | Admitting: Physical Medicine and Rehabilitation

## 2011-07-31 DIAGNOSIS — M545 Low back pain, unspecified: Secondary | ICD-10-CM

## 2011-08-06 ENCOUNTER — Ambulatory Visit
Admission: RE | Admit: 2011-08-06 | Discharge: 2011-08-06 | Disposition: A | Discharge status: Home / Self Care | Payer: BC Managed Care – PPO | Source: Ambulatory Visit | Attending: Physical Medicine and Rehabilitation | Admitting: Physical Medicine and Rehabilitation

## 2011-08-06 DIAGNOSIS — M545 Low back pain, unspecified: Secondary | ICD-10-CM

## 2011-08-09 ENCOUNTER — Telehealth: Payer: Self-pay | Admitting: Family Medicine

## 2011-08-09 NOTE — Telephone Encounter (Signed)
Pt states she needs a letter from Dr. Laury Axon stating she can return to work on Monday, 08/14/11. Call [phone]

## 2011-08-09 NOTE — Telephone Encounter (Signed)
Please advise      KP 

## 2011-08-09 NOTE — Telephone Encounter (Signed)
Ok to give note 

## 2011-08-10 NOTE — Telephone Encounter (Signed)
Patient aware letter ready for pick up.      KP 

## 2011-09-07 ENCOUNTER — Encounter (INDEPENDENT_AMBULATORY_CARE_PROVIDER_SITE_OTHER): Payer: Self-pay | Admitting: Surgery

## 2011-09-07 ENCOUNTER — Ambulatory Visit (INDEPENDENT_AMBULATORY_CARE_PROVIDER_SITE_OTHER): Payer: BC Managed Care – PPO | Admitting: Surgery

## 2011-09-07 VITALS — BP 116/68 | HR 70 | Temp 97.3°F | Resp 16 | Ht 62.0 in | Wt 144.0 lb

## 2011-09-07 DIAGNOSIS — Z9884 Bariatric surgery status: Secondary | ICD-10-CM

## 2011-09-07 NOTE — Patient Instructions (Signed)
You are almost 6 years out from surgery (11/20/2005) You continue to do great and have found a successful formula.  Continue to follow that formula.    Followup with Dr. Laury Axon is crucial.  I will see you again in a year unless needed sooner.

## 2011-09-07 NOTE — Progress Notes (Signed)
[name] 46 y.o.  Body mass index is 26.34 kg/(m^2).  Patient Active Problem List  Diagnosis  . HEMANGIOMA, SKIN  . LYMPHOPENIA  . DEPRESSION  . RESTLESS LEGS SYNDROME  . URI  . VAGINAL BLEEDING  . DYSURIA  . ABDOMINAL PAIN OTHER SPECIFIED SITE  . 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 Bronchitis    Allergies  Allergen Reactions  . Penicillins Other (See Comments)    Happened in childhood at 46 years of age, unsure of reaction.    Past Surgical History  Procedure Date  . Abdominal hysterectomy     fibroids  . Gastric bypass    Loreen Freud, DO No diagnosis found.  Doing great.  Preop picture is remarkable .  She is almost 6 years out from gastric bypass.  No real issues.  The yearly bouts of right sided abdominal pain do not sound like internal hernia.   Will see back in a year.  Great followup by Dr. Laury Axon.   Matt B. Daphine Deutscher, MD, [facility] [phone] beeper [phone]  09/07/2011 11:36 AM

## 2012-01-08 ENCOUNTER — Ambulatory Visit (INDEPENDENT_AMBULATORY_CARE_PROVIDER_SITE_OTHER): Payer: BC Managed Care – PPO | Admitting: Internal Medicine

## 2012-01-08 ENCOUNTER — Encounter: Payer: Self-pay | Admitting: Internal Medicine

## 2012-01-08 VITALS — BP 122/80 | HR 64 | Temp 97.9°F | Wt 144.6 lb

## 2012-01-08 DIAGNOSIS — J209 Acute bronchitis, unspecified: Secondary | ICD-10-CM

## 2012-01-08 DIAGNOSIS — J321 Chronic frontal sinusitis: Secondary | ICD-10-CM

## 2012-01-08 MED ORDER — HYDROCODONE-HOMATROPINE 5-1.5 MG/5ML PO SYRP: 5.0000 mL | ORAL_SOLUTION | Freq: Four times a day (QID) | ORAL | Status: DC | PRN

## 2012-01-08 MED ORDER — SULFAMETHOXAZOLE-TRIMETHOPRIM 800-160 MG PO TABS: 1.0000 | ORAL_TABLET | Freq: Two times a day (BID) | ORAL | Status: DC

## 2012-01-08 NOTE — Progress Notes (Signed)
  Subjective:    Patient ID: [name], female    DOB: 1965/05/17, 46 y.o.   MRN: [id number]  HPI Symptoms began 2 weeks ago as hoarseness, sore throat and dry cough. This progressed to head congestion with frontal headache and green nasal discharge with some blood .These became clear 12/13.  Subsequently she developed green sputum production.  She treated her symptoms with over-the-counter sinus medications, tessalon perles & OTC cough syrup.    Review of Systems She denies itchy, watery eyes. She did have some sneezing. She's had some ear pressure but no pain or discharge. She's had no associated shortness of breath or wheezing. Also she has not had fever, chills, or sweats.     Objective:   Physical Exam General appearance:good health ;well nourished; no acute distress or increased work of breathing is present.  No  lymphadenopathy about the head, neck, or axilla noted.   Eyes: No conjunctival inflammation or lid edema is present.   Ears:  External ear exam shows no significant lesions or deformities. Jewelry in upper ears. Otoscopic examination reveals clear canals, tympanic membranes are intact bilaterally without bulging, retraction, inflammation or discharge.  Nose:  External nasal examination shows no deformity or inflammation. Nasal mucosa are pink and moist without lesions or exudates. No septal dislocation or deviation.No obstruction to airflow.   Oral exam: Dental hygiene is good; lips and gums are healthy appearing.There is no oropharyngeal erythema or exudate noted.   Neck:  No deformities,  masses, or tenderness noted.     Heart:  Normal rate and regular rhythm. S1 and S2 normal without gallop, murmur, click, rub or other extra sounds.   Lungs:Chest clear to auscultation; no wheezes, rhonchi,rales ,or rubs present.No increased work of breathing.    Extremities:  No cyanosis, edema, or clubbing  noted    Skin: Warm & dry           Assessment & Plan:  #1  rhinosinusitis   #2 acute bronchitis  Plan: Nasal hygiene interventions discussed. See prescription medications

## 2012-01-08 NOTE — Patient Instructions (Addendum)
Plain Mucinex for thick secretions ;force NON dairy fluids . Use a Neti pot daily as needed for sinus congestion; going from open side to congested side . Nasal cleansing in the shower as discussed. Make sure that all residual soap is removed to prevent irritation.  

## 2012-01-24 HISTORY — PX: COLONOSCOPY: SHX174

## 2012-01-31 ENCOUNTER — Ambulatory Visit (INDEPENDENT_AMBULATORY_CARE_PROVIDER_SITE_OTHER): Payer: BC Managed Care – PPO | Admitting: Internal Medicine

## 2012-01-31 ENCOUNTER — Encounter: Payer: Self-pay | Admitting: *Deleted

## 2012-01-31 ENCOUNTER — Encounter: Payer: Self-pay | Admitting: Internal Medicine

## 2012-01-31 VITALS — BP 110/72 | HR 98 | Wt 134.0 lb

## 2012-01-31 DIAGNOSIS — R197 Diarrhea, unspecified: Secondary | ICD-10-CM

## 2012-01-31 MED ORDER — PROMETHAZINE HCL 12.5 MG PO TABS: 12.5000 mg | ORAL_TABLET | Freq: Four times a day (QID) | ORAL | Status: DC | PRN

## 2012-01-31 NOTE — Patient Instructions (Addendum)
Rest, fluids, Phenergan as needed for nausea. Call anytime if diarrhea, stomach pain or headache get severe Call if not improving in the next 2-3 days   Viral Gastroenteritis Viral gastroenteritis is also known as stomach flu. This condition affects the stomach and intestinal tract. It can cause sudden diarrhea and vomiting. The illness typically lasts 3 to 8 days. Most people develop an immune response that eventually gets rid of the virus. While this natural response develops, the virus can make you quite ill. CAUSES  Many different viruses can cause gastroenteritis, such as rotavirus or noroviruses. You can catch one of these viruses by consuming contaminated food or water. You may also catch a virus by sharing utensils or other personal items with an infected person or by touching a contaminated surface. SYMPTOMS  The most common symptoms are diarrhea and vomiting. These problems can cause a severe loss of body fluids (dehydration) and a body salt (electrolyte) imbalance. Other symptoms may include:  Fever.  Headache.  Fatigue.  Abdominal pain. DIAGNOSIS  Your caregiver can usually diagnose viral gastroenteritis based on your symptoms and a physical exam. A stool sample may also be taken to test for the presence of viruses or other infections. TREATMENT  This illness typically goes away on its own. Treatments are aimed at rehydration. The most serious cases of viral gastroenteritis involve vomiting so severely that you are not able to keep fluids down. In these cases, fluids must be given through an intravenous line (IV). HOME CARE INSTRUCTIONS   Drink enough fluids to keep your urine clear or pale yellow. Drink small amounts of fluids frequently and increase the amounts as tolerated.  Ask your caregiver for specific rehydration instructions.  Avoid:  Foods high in sugar.  Alcohol.  Carbonated drinks.  Tobacco.  Juice.  Caffeine drinks.  Extremely hot or cold  fluids.  Fatty, greasy foods.  Too much intake of anything at one time.  Dairy products until 24 to 48 hours after diarrhea stops.  You may consume probiotics. Probiotics are active cultures of beneficial bacteria. They may lessen the amount and number of diarrheal stools in adults. Probiotics can be found in yogurt with active cultures and in supplements.  Wash your hands well to avoid spreading the virus.  Only take over-the-counter or prescription medicines for pain, discomfort, or fever as directed by your caregiver. Do not give aspirin to children. Antidiarrheal medicines are not recommended.  Ask your caregiver if you should continue to take your regular prescribed and over-the-counter medicines.  Keep all follow-up appointments as directed by your caregiver. SEEK IMMEDIATE MEDICAL CARE IF:   You are unable to keep fluids down.  You do not urinate at least once every 6 to 8 hours.  You develop shortness of breath.  You notice blood in your stool or vomit. This may look like coffee grounds.  You have abdominal pain that increases or is concentrated in one small area (localized).  You have persistent vomiting or diarrhea.  You have a fever.  The patient is a child younger than 3 months, and he or she has a fever.  The patient is a child older than 3 months, and he or she has a fever and persistent symptoms.  The patient is a child older than 3 months, and he or she has a fever and symptoms suddenly get worse.  The patient is a baby, and he or she has no tears when crying. MAKE SURE YOU:   Understand these instructions.  Will watch your condition.  Will get help right away if you are not doing well or get worse. Document Released: 01/09/2005 Document Revised: 04/03/2011 Document Reviewed: 10/26/2010 [facility] Patient Information 2013 [place], [place].

## 2012-01-31 NOTE — Progress Notes (Signed)
  Subjective:    Patient ID: [name], female    DOB: [birth year], 47 y.o.   MRN: [id number]  HPI Acute visit Started to feel unwell 2 days ago some headache, mostly  @ the forehead,some of the back of the head. Also dizziness and nausea. Yesterday the nausea increased, she had a lot of "stomach noises" and eventually developed diarrhea. The diarrhea is watery, nonbloody. She had an additional episode today.   Past Medical History  Diagnosis Date  . Allergy   . GERD (gastroesophageal reflux disease)   . Depression    Past Surgical History  Procedure Date  . Abdominal hysterectomy     fibroids  . Gastric bypass     Review of Systems Status post Bactrim last month. No fever or chills. No cough No dysuria gross hematuria. Denies any vomiting. Appetite is decreased. No actual abdominal pain.     Objective:   Physical Exam General -- alert, well-developed, and well-nourished.   HEENT -- no pale or jaundice, EOMI-PERLA Lungs -- normal respiratory effort, no intercostal retractions, no accessory muscle use, and normal breath sounds.   Heart-- normal rate, regular rhythm, no murmur, and no gallop.   Abdomen--soft, non-tender, no distention, slightly increased bowel sounds. Aorta is palpable at the upper abdomen, nontender..   Extremities-- no pretibial edema bilaterally Psych-- Cognition and judgment appear intact. Alert and cooperative with normal attention span and concentration.  not anxious appearing and not depressed appearing.       Assessment & Plan:  Diarrhea, Patient presents with acute diarrhea, vital signs stable, she is not toxic appearing. Norovirus? She also has a headache;  HEENT and neurological exam normal. Plan: Conservative treatment.  See instructions. Phenergan as needed.  Palpable aorta, She is very thin and that may account for the aorta being  palpable ; I review the chart and in she had a CT of the abdomen and pelvis last year with no mention  of aneurysm. Recommend observation.

## 2012-02-26 DIAGNOSIS — Z0279 Encounter for issue of other medical certificate: Secondary | ICD-10-CM

## 2012-02-28 ENCOUNTER — Encounter: Payer: Self-pay | Admitting: Lab

## 2012-02-29 ENCOUNTER — Encounter: Payer: Self-pay | Admitting: Family Medicine

## 2012-02-29 ENCOUNTER — Ambulatory Visit (INDEPENDENT_AMBULATORY_CARE_PROVIDER_SITE_OTHER): Payer: BC Managed Care – PPO | Admitting: Family Medicine

## 2012-02-29 VITALS — BP 114/80 | HR 57 | Temp 97.6°F | Wt 141.8 lb

## 2012-02-29 DIAGNOSIS — R109 Unspecified abdominal pain: Secondary | ICD-10-CM

## 2012-02-29 MED ORDER — OMEPRAZOLE 20 MG PO CPDR: 20.0000 mg | DELAYED_RELEASE_CAPSULE | Freq: Every day | ORAL | Status: DC

## 2012-02-29 NOTE — Progress Notes (Signed)
  Subjective:     [name] is a 47 y.o. female who presents for evaluation of abdominal pain. Onset was several days ago. Symptoms have been gradually worsening. The pain is described as burning and sharp, and is 7/10 in intensity. Pain is located in the RUQ, epigastric region and RLQ without radiation.  Aggravating factors: activity and eating.  Alleviating factors: none. Associated symptoms: anorexia, belching, diarrhea and nausea. The patient denies arthralagias, chills, constipation, dysuria, fever, flatus, frequency and myalgias.  The patient's history has been marked as reviewed and updated as appropriate.  Review of Systems Pertinent items are noted in HPI.     Objective:    BP 114/80  Pulse 57  Temp 97.6 F (36.4 C) (Oral)  Wt 141 lb 12.8 oz (64.32 kg)  SpO2 98% General appearance: alert, cooperative, appears stated age and no distress Abdomen: abnormal findings:  guarding, hypoactive bowel sounds and moderate tenderness in the epigastrium, in the RUQ and in the RLQ    Assessment:    Abdominal pain, --? Etiology, pancreas, ulcer  .    Plan:    The diagnosis was discussed with the patient and evaluation and treatment plans outlined. See orders for lab and imaging studies. Adhere to simple, bland diet. Initiate empiric trial of acid suppression; see orders. Further follow-up plans will be based on outcome of lab/imaging studies; see orders. Follow up as needed.  Subjective:

## 2012-02-29 NOTE — Patient Instructions (Addendum)
Abdominal Pain  Abdominal pain can be caused by many things. Your caregiver decides the seriousness of your pain by an examination and possibly blood tests and X-rays. Many cases can be observed and treated at home. Most abdominal pain is not caused by a disease and will probably improve without treatment. However, in many cases, more time must pass before a clear cause of the pain can be found. Before that point, it may not be known if you need more testing, or if hospitalization or surgery is needed.  HOME CARE INSTRUCTIONS   · Do not take laxatives unless directed by your caregiver.  · Take pain medicine only as directed by your caregiver.  · Only take over-the-counter or prescription medicines for pain, discomfort, or fever as directed by your caregiver.  · Try a clear liquid diet (broth, tea, or water) for as long as directed by your caregiver. Slowly move to a bland diet as tolerated.  SEEK IMMEDIATE MEDICAL CARE IF:   · The pain does not go away.  · You have a fever.  · You keep throwing up (vomiting).  · The pain is felt only in portions of the abdomen. Pain in the right side could possibly be appendicitis. In an adult, pain in the left lower portion of the abdomen could be colitis or diverticulitis.  · You pass bloody or black tarry stools.  MAKE SURE YOU:   · Understand these instructions.  · Will watch your condition.  · Will get help right away if you are not doing well or get worse.  Document Released: 10/19/2004 Document Revised: 04/03/2011 Document Reviewed: 08/28/2007  [employer]® Patient Information ©2013 [employer].

## 2012-03-01 ENCOUNTER — Other Ambulatory Visit: Payer: BC Managed Care – PPO

## 2012-03-01 LAB — BASIC METABOLIC PANEL
BUN: 21 mg/dL (ref 6–23)
CO2: 26 mEq/L (ref 19–32)
GFR: 98.62 mL/min (ref >60.00)
Glucose, Bld: 88 mg/dL (ref 70–99)
Potassium: 3.9 mEq/L (ref 3.5–5.1)
Sodium: 139 mEq/L (ref 135–145)

## 2012-03-01 LAB — POCT URINALYSIS DIPSTICK
Bilirubin, UA: NEGATIVE
Glucose, UA: NEGATIVE
Ketones, UA: NEGATIVE
Leukocytes, UA: NEGATIVE
Nitrite, UA: NEGATIVE
pH, UA: 6

## 2012-03-01 LAB — CBC WITH DIFFERENTIAL/PLATELET
Basophils Absolute: 0 10*3/uL (ref 0.0–0.1)
Eosinophils Absolute: 0.2 10*3/uL (ref 0.0–0.7)
HCT: 36.6 % (ref 36.0–46.0)
Hemoglobin: 12.4 g/dL (ref 12.0–15.0)
Lymphs Abs: 2.4 10*3/uL (ref 0.7–4.0)
MCHC: 34 g/dL (ref 30.0–36.0)
Monocytes Absolute: 0.3 10*3/uL (ref 0.1–1.0)
Monocytes Relative: 5.2 % (ref 3.0–12.0)
Neutro Abs: 3.7 10*3/uL (ref 1.4–7.7)
Platelets: 213 10*3/uL (ref 150.0–400.0)
RDW: 13.9 % (ref 11.5–14.6)

## 2012-03-01 LAB — HEPATIC FUNCTION PANEL
Albumin: 4.3 g/dL (ref 3.5–5.2)
Total Bilirubin: 0.5 mg/dL (ref 0.3–1.2)

## 2012-03-01 LAB — AMYLASE: Amylase: 114 U/L (ref 27–131)

## 2012-03-01 LAB — LIPASE: Lipase: 39 U/L (ref 11.0–59.0)

## 2012-03-02 ENCOUNTER — Ambulatory Visit (HOSPITAL_BASED_OUTPATIENT_CLINIC_OR_DEPARTMENT_OTHER)
Admission: RE | Admit: 2012-03-02 | Discharge: 2012-03-02 | Disposition: A | Discharge status: Home / Self Care | Payer: BC Managed Care – PPO | Source: Ambulatory Visit | Attending: Family Medicine | Admitting: Family Medicine

## 2012-03-02 DIAGNOSIS — R109 Unspecified abdominal pain: Secondary | ICD-10-CM

## 2012-03-02 DIAGNOSIS — Z9884 Bariatric surgery status: Secondary | ICD-10-CM | POA: Insufficient documentation

## 2012-03-02 DIAGNOSIS — R1031 Right lower quadrant pain: Secondary | ICD-10-CM | POA: Insufficient documentation

## 2012-03-02 MED ORDER — IOHEXOL 300 MG/ML  SOLN
100.0000 mL | Freq: Once | INTRAMUSCULAR | Status: AC | PRN
  Administered 2012-03-02: 100 mL via INTRAVENOUS

## 2012-03-05 ENCOUNTER — Other Ambulatory Visit: Payer: Self-pay | Admitting: Family Medicine

## 2012-03-05 DIAGNOSIS — R109 Unspecified abdominal pain: Secondary | ICD-10-CM

## 2012-03-06 ENCOUNTER — Encounter: Payer: Self-pay | Admitting: Gastroenterology

## 2012-03-06 LAB — H. PYLORI ANTIBODY, IGG: H Pylori IgG: NEGATIVE

## 2012-03-09 ENCOUNTER — Other Ambulatory Visit: Payer: Self-pay

## 2012-03-13 ENCOUNTER — Ambulatory Visit (INDEPENDENT_AMBULATORY_CARE_PROVIDER_SITE_OTHER): Payer: BC Managed Care – PPO | Admitting: Family Medicine

## 2012-03-13 ENCOUNTER — Encounter: Payer: Self-pay | Admitting: Family Medicine

## 2012-03-13 VITALS — BP 110/70 | HR 67 | Temp 98.3°F | Ht 62.0 in | Wt 138.4 lb

## 2012-03-13 DIAGNOSIS — F329 Major depressive disorder, single episode, unspecified: Secondary | ICD-10-CM

## 2012-03-13 DIAGNOSIS — Z0000 Encounter for general adult medical examination without abnormal findings: Secondary | ICD-10-CM

## 2012-03-13 LAB — VITAMIN B12: Vitamin B-12: 361 pg/mL (ref 211–911)

## 2012-03-13 LAB — LIPID PANEL
Cholesterol: 174 mg/dL (ref 0–200)
VLDL: 15.8 mg/dL (ref 0.0–40.0)

## 2012-03-13 NOTE — Assessment & Plan Note (Signed)
Controlled con't meds  

## 2012-03-13 NOTE — Patient Instructions (Addendum)
Preventive Care for Adults, Female A healthy lifestyle and preventive care can promote health and wellness. Preventive health guidelines for women include the following key practices.  A routine yearly physical is a good way to check with your caregiver about your health and preventive screening. It is a chance to share any concerns and updates on your health, and to receive a thorough exam.  Visit your dentist for a routine exam and preventive care every 6 months. Brush your teeth twice a day and floss once a day. Good oral hygiene prevents tooth decay and gum disease.  The frequency of eye exams is based on your age, health, family medical history, use of contact lenses, and other factors. Follow your caregiver's recommendations for frequency of eye exams.  Eat a healthy diet. Foods like vegetables, fruits, whole grains, low-fat dairy products, and lean protein foods contain the nutrients you need without too many calories. Decrease your intake of foods high in solid fats, added sugars, and salt. Eat the right amount of calories for you.Get information about a proper diet from your caregiver, if necessary.  Regular physical exercise is one of the most important things you can do for your health. Most adults should get at least 150 minutes of moderate-intensity exercise (any activity that increases your heart rate and causes you to sweat) each week. In addition, most adults need muscle-strengthening exercises on 2 or more days a week.  Maintain a healthy weight. The body mass index (BMI) is a screening tool to identify possible weight problems. It provides an estimate of body fat based on height and weight. Your caregiver can help determine your BMI, and can help you achieve or maintain a healthy weight.For adults 20 years and older:  A BMI below 18.5 is considered underweight.  A BMI of 18.5 to 24.9 is normal.  A BMI of 25 to 29.9 is considered overweight.  A BMI of 30 and above is  considered obese.  Maintain normal blood lipids and cholesterol levels by exercising and minimizing your intake of saturated fat. Eat a balanced diet with plenty of fruit and vegetables. Blood tests for lipids and cholesterol should begin at age 20 and be repeated every 5 years. If your lipid or cholesterol levels are high, you are over 50, or you are at high risk for heart disease, you may need your cholesterol levels checked more frequently.Ongoing high lipid and cholesterol levels should be treated with medicines if diet and exercise are not effective.  If you smoke, find out from your caregiver how to quit. If you do not use tobacco, do not start.  If you are pregnant, do not drink alcohol. If you are breastfeeding, be very cautious about drinking alcohol. If you are not pregnant and choose to drink alcohol, do not exceed 1 drink per day. One drink is considered to be 12 ounces (355 mL) of beer, 5 ounces (148 mL) of wine, or 1.5 ounces (44 mL) of liquor.  Avoid use of street drugs. Do not share needles with anyone. Ask for help if you need support or instructions about stopping the use of drugs.  High blood pressure causes heart disease and increases the risk of stroke. Your blood pressure should be checked at least every 1 to 2 years. Ongoing high blood pressure should be treated with medicines if weight loss and exercise are not effective.  If you are 55 to 47 years old, ask your caregiver if you should take aspirin to prevent strokes.  Diabetes   screening involves taking a blood sample to check your fasting blood sugar level. This should be done once every 3 years, after age 45, if you are within normal weight and without risk factors for diabetes. Testing should be considered at a younger age or be carried out more frequently if you are overweight and have at least 1 risk factor for diabetes.  Breast cancer screening is essential preventive care for women. You should practice "breast  self-awareness." This means understanding the normal appearance and feel of your breasts and may include breast self-examination. Any changes detected, no matter how small, should be reported to a caregiver. Women in their 20s and 30s should have a clinical breast exam (CBE) by a caregiver as part of a regular health exam every 1 to 3 years. After age 40, women should have a CBE every year. Starting at age 40, women should consider having a mammography (breast X-ray test) every year. Women who have a family history of breast cancer should talk to their caregiver about genetic screening. Women at a high risk of breast cancer should talk to their caregivers about having magnetic resonance imaging (MRI) and a mammography every year.  The Pap test is a screening test for cervical cancer. A Pap test can show cell changes on the cervix that might become cervical cancer if left untreated. A Pap test is a procedure in which cells are obtained and examined from the lower end of the uterus (cervix).  Women should have a Pap test starting at age 21.  Between ages 21 and 29, Pap tests should be repeated every 2 years.  Beginning at age 30, you should have a Pap test every 3 years as long as the past 3 Pap tests have been normal.  Some women have medical problems that increase the chance of getting cervical cancer. Talk to your caregiver about these problems. It is especially important to talk to your caregiver if a new problem develops soon after your last Pap test. In these cases, your caregiver may recommend more frequent screening and Pap tests.  The above recommendations are the same for women who have or have not gotten the vaccine for human papillomavirus (HPV).  If you had a hysterectomy for a problem that was not cancer or a condition that could lead to cancer, then you no longer need Pap tests. Even if you no longer need a Pap test, a regular exam is a good idea to make sure no other problems are  starting.  If you are between ages 65 and 70, and you have had normal Pap tests going back 10 years, you no longer need Pap tests. Even if you no longer need a Pap test, a regular exam is a good idea to make sure no other problems are starting.  If you have had past treatment for cervical cancer or a condition that could lead to cancer, you need Pap tests and screening for cancer for at least 20 years after your treatment.  If Pap tests have been discontinued, risk factors (such as a new sexual partner) need to be reassessed to determine if screening should be resumed.  The HPV test is an additional test that may be used for cervical cancer screening. The HPV test looks for the virus that can cause the cell changes on the cervix. The cells collected during the Pap test can be tested for HPV. The HPV test could be used to screen women aged 30 years and older, and should   be used in women of any age who have unclear Pap test results. After the age of 30, women should have HPV testing at the same frequency as a Pap test.  Colorectal cancer can be detected and often prevented. Most routine colorectal cancer screening begins at the age of 50 and continues through age 75. However, your caregiver may recommend screening at an earlier age if you have risk factors for colon cancer. On a yearly basis, your caregiver may provide home test kits to check for hidden blood in the stool. Use of a small camera at the end of a tube, to directly examine the colon (sigmoidoscopy or colonoscopy), can detect the earliest forms of colorectal cancer. Talk to your caregiver about this at age 50, when routine screening begins. Direct examination of the colon should be repeated every 5 to 10 years through age 75, unless early forms of pre-cancerous polyps or small growths are found.  Hepatitis C blood testing is recommended for all people born from 1945 through 1965 and any individual with known risks for hepatitis C.  Practice  safe sex. Use condoms and avoid high-risk sexual practices to reduce the spread of sexually transmitted infections (STIs). STIs include gonorrhea, chlamydia, syphilis, trichomonas, herpes, HPV, and human immunodeficiency virus (HIV). Herpes, HIV, and HPV are viral illnesses that have no cure. They can result in disability, cancer, and death. Sexually active women aged 25 and younger should be checked for chlamydia. Older women with new or multiple partners should also be tested for chlamydia. Testing for other STIs is recommended if you are sexually active and at increased risk.  Osteoporosis is a disease in which the bones lose minerals and strength with aging. This can result in serious bone fractures. The risk of osteoporosis can be identified using a bone density scan. Women ages 65 and over and women at risk for fractures or osteoporosis should discuss screening with their caregivers. Ask your caregiver whether you should take a calcium supplement or vitamin D to reduce the rate of osteoporosis.  Menopause can be associated with physical symptoms and risks. Hormone replacement therapy is available to decrease symptoms and risks. You should talk to your caregiver about whether hormone replacement therapy is right for you.  Use sunscreen with sun protection factor (SPF) of 30 or more. Apply sunscreen liberally and repeatedly throughout the day. You should seek shade when your shadow is shorter than you. Protect yourself by wearing long sleeves, pants, a wide-brimmed hat, and sunglasses year round, whenever you are outdoors.  Once a month, do a whole body skin exam, using a mirror to look at the skin on your back. Notify your caregiver of new moles, moles that have irregular borders, moles that are larger than a pencil eraser, or moles that have changed in shape or color.  Stay current with required immunizations.  Influenza. You need a dose every fall (or winter). The composition of the flu vaccine  changes each year, so being vaccinated once is not enough.  Pneumococcal polysaccharide. You need 1 to 2 doses if you smoke cigarettes or if you have certain chronic medical conditions. You need 1 dose at age 65 (or older) if you have never been vaccinated.  Tetanus, diphtheria, pertussis (Tdap, Td). Get 1 dose of Tdap vaccine if you are younger than age 65, are over 65 and have contact with an infant, are a healthcare worker, are pregnant, or simply want to be protected from whooping cough. After that, you need a Td   booster dose every 10 years. Consult your caregiver if you have not had at least 3 tetanus and diphtheria-containing shots sometime in your life or have a deep or dirty wound.  HPV. You need this vaccine if you are a woman age 26 or younger. The vaccine is given in 3 doses over 6 months.  Measles, mumps, rubella (MMR). You need at least 1 dose of MMR if you were born in 1957 or later. You may also need a second dose.  Meningococcal. If you are age 19 to 21 and a first-year college student living in a residence hall, or have one of several medical conditions, you need to get vaccinated against meningococcal disease. You may also need additional booster doses.  Zoster (shingles). If you are age 60 or older, you should get this vaccine.  Varicella (chickenpox). If you have never had chickenpox or you were vaccinated but received only 1 dose, talk to your caregiver to find out if you need this vaccine.  Hepatitis A. You need this vaccine if you have a specific risk factor for hepatitis A virus infection or you simply wish to be protected from this disease. The vaccine is usually given as 2 doses, 6 to 18 months apart.  Hepatitis B. You need this vaccine if you have a specific risk factor for hepatitis B virus infection or you simply wish to be protected from this disease. The vaccine is given in 3 doses, usually over 6 months. Preventive Services / Frequency Ages 19 to 39  Blood  pressure check.** / Every 1 to 2 years.  Lipid and cholesterol check.** / Every 5 years beginning at age 20.  Clinical breast exam.** / Every 3 years for women in their 20s and 30s.  Pap test.** / Every 2 years from ages 21 through 29. Every 3 years starting at age 30 through age 65 or 70 with a history of 3 consecutive normal Pap tests.  HPV screening.** / Every 3 years from ages 30 through ages 65 to 70 with a history of 3 consecutive normal Pap tests.  Hepatitis C blood test.** / For any individual with known risks for hepatitis C.  Skin self-exam. / Monthly.  Influenza immunization.** / Every year.  Pneumococcal polysaccharide immunization.** / 1 to 2 doses if you smoke cigarettes or if you have certain chronic medical conditions.  Tetanus, diphtheria, pertussis (Tdap, Td) immunization. / A one-time dose of Tdap vaccine. After that, you need a Td booster dose every 10 years.  HPV immunization. / 3 doses over 6 months, if you are 26 and younger.  Measles, mumps, rubella (MMR) immunization. / You need at least 1 dose of MMR if you were born in 1957 or later. You may also need a second dose.  Meningococcal immunization. / 1 dose if you are age 19 to 21 and a first-year college student living in a residence hall, or have one of several medical conditions, you need to get vaccinated against meningococcal disease. You may also need additional booster doses.  Varicella immunization.** / Consult your caregiver.  Hepatitis A immunization.** / Consult your caregiver. 2 doses, 6 to 18 months apart.  Hepatitis B immunization.** / Consult your caregiver. 3 doses usually over 6 months. Ages 40 to 64  Blood pressure check.** / Every 1 to 2 years.  Lipid and cholesterol check.** / Every 5 years beginning at age 20.  Clinical breast exam.** / Every year after age 40.  Mammogram.** / Every year beginning at age 40   and continuing for as long as you are in good health. Consult with your  caregiver.  Pap test.** / Every 3 years starting at age 30 through age 65 or 70 with a history of 3 consecutive normal Pap tests.  HPV screening.** / Every 3 years from ages 30 through ages 65 to 70 with a history of 3 consecutive normal Pap tests.  Fecal occult blood test (FOBT) of stool. / Every year beginning at age 50 and continuing until age 75. You may not need to do this test if you get a colonoscopy every 10 years.  Flexible sigmoidoscopy or colonoscopy.** / Every 5 years for a flexible sigmoidoscopy or every 10 years for a colonoscopy beginning at age 50 and continuing until age 75.  Hepatitis C blood test.** / For all people born from 1945 through 1965 and any individual with known risks for hepatitis C.  Skin self-exam. / Monthly.  Influenza immunization.** / Every year.  Pneumococcal polysaccharide immunization.** / 1 to 2 doses if you smoke cigarettes or if you have certain chronic medical conditions.  Tetanus, diphtheria, pertussis (Tdap, Td) immunization.** / A one-time dose of Tdap vaccine. After that, you need a Td booster dose every 10 years.  Measles, mumps, rubella (MMR) immunization. / You need at least 1 dose of MMR if you were born in 1957 or later. You may also need a second dose.  Varicella immunization.** / Consult your caregiver.  Meningococcal immunization.** / Consult your caregiver.  Hepatitis A immunization.** / Consult your caregiver. 2 doses, 6 to 18 months apart.  Hepatitis B immunization.** / Consult your caregiver. 3 doses, usually over 6 months. Ages 65 and over  Blood pressure check.** / Every 1 to 2 years.  Lipid and cholesterol check.** / Every 5 years beginning at age 20.  Clinical breast exam.** / Every year after age 40.  Mammogram.** / Every year beginning at age 40 and continuing for as long as you are in good health. Consult with your caregiver.  Pap test.** / Every 3 years starting at age 30 through age 65 or 70 with a 3  consecutive normal Pap tests. Testing can be stopped between 65 and 70 with 3 consecutive normal Pap tests and no abnormal Pap or HPV tests in the past 10 years.  HPV screening.** / Every 3 years from ages 30 through ages 65 or 70 with a history of 3 consecutive normal Pap tests. Testing can be stopped between 65 and 70 with 3 consecutive normal Pap tests and no abnormal Pap or HPV tests in the past 10 years.  Fecal occult blood test (FOBT) of stool. / Every year beginning at age 50 and continuing until age 75. You may not need to do this test if you get a colonoscopy every 10 years.  Flexible sigmoidoscopy or colonoscopy.** / Every 5 years for a flexible sigmoidoscopy or every 10 years for a colonoscopy beginning at age 50 and continuing until age 75.  Hepatitis C blood test.** / For all people born from 1945 through 1965 and any individual with known risks for hepatitis C.  Osteoporosis screening.** / A one-time screening for women ages 65 and over and women at risk for fractures or osteoporosis.  Skin self-exam. / Monthly.  Influenza immunization.** / Every year.  Pneumococcal polysaccharide immunization.** / 1 dose at age 65 (or older) if you have never been vaccinated.  Tetanus, diphtheria, pertussis (Tdap, Td) immunization. / A one-time dose of Tdap vaccine if you are over   65 and have contact with an infant, are a healthcare worker, or simply want to be protected from whooping cough. After that, you need a Td booster dose every 10 years.  Varicella immunization.** / Consult your caregiver.  Meningococcal immunization.** / Consult your caregiver.  Hepatitis A immunization.** / Consult your caregiver. 2 doses, 6 to 18 months apart.  Hepatitis B immunization.** / Check with your caregiver. 3 doses, usually over 6 months. ** Family history and personal history of risk and conditions may change your caregiver's recommendations. Document Released: 03/07/2001 Document Revised: 04/03/2011  Document Reviewed: 06/06/2010 [employer] Patient Information 2013 [employer].  

## 2012-03-13 NOTE — Progress Notes (Signed)
Subjective:     [name] is a 47 y.o. female and is here for a comprehensive physical exam. The patient reports no problems.  History   Social History  . Marital Status: Married    Spouse Name: N/A    Number of Children: N/A  . Years of Education: N/A   Occupational History  .      [employer]   Social History Main Topics  . Smoking status: Former Smoker    Quit date: 01/24/1987  . Smokeless tobacco: Never Used  . Alcohol Use: Yes     Comment: occasionally  . Drug Use: No  . Sexually Active: Not Currently   Other Topics Concern  . Not on file   Social History Narrative   Regular exercise--yes   Health Maintenance  Topic Date Due  . Influenza Vaccine  09/24/2011  . Pap Smear  07/03/2014  . Tetanus/tdap  02/26/2018    The following portions of the patient's history were reviewed and updated as appropriate:  She  has a past medical history of Allergy; GERD (gastroesophageal reflux disease); and Depression. She  does not have any pertinent problems on file. She  has past surgical history that includes Abdominal hysterectomy and Gastric bypass. Her family history includes Cancer in her father, maternal grandfather, and paternal grandfather; Depression in her brother and sister; Diabetes in her maternal grandfather, maternal grandmother, paternal grandfather, and paternal grandmother; Hypertension in her father; and Liver disease in her maternal grandfather. She  reports that she quit smoking about 25 years ago. She has never used smokeless tobacco. She reports that  drinks alcohol. She reports that she does not use illicit drugs. She has a current medication list which includes the following prescription(s): vitamin d, cyclobenzaprine, ferrous sulfate, hydrocodone-acetaminophen, lorazepam, omeprazole, ropinirole, sertraline hcl, and vitamin b-12. Current Outpatient Prescriptions on File Prior to Visit  Medication Sig Dispense Refill  . Cholecalciferol (VITAMIN  D) 2000 UNITS CAPS Take 1 capsule by mouth daily.        . cyclobenzaprine (FLEXERIL) 10 MG tablet Take 1 tablet (10 mg total) by mouth 3 (three) times daily as needed.  30 tablet  0  . FERROUS SULFATE PO Take 1 tablet by mouth daily.        Marland Kitchen HYDROcodone-acetaminophen (VICODIN) 5-500 MG per tablet       . omeprazole (PRILOSEC) 20 MG capsule Take 1 capsule (20 mg total) by mouth daily.  30 capsule  3  . rOPINIRole (REQUIP) 0.25 MG tablet Take 1 tablet (0.25 mg total) by mouth 3 (three) times daily.  90 tablet  3  . Sertraline HCl (ZOLOFT PO) Take 1 tablet by mouth daily.       . vitamin B-12 (CYANOCOBALAMIN) 1000 MCG tablet Take 1,000 mcg by mouth daily.        . [DISCONTINUED] Azelastine-Fluticasone (DYMISTA) 137-50 MCG/ACT SUSP Place 1 spray into the nose 2 (two) times daily.      . [DISCONTINUED] loratadine (CLARITIN) 10 MG tablet Take 10 mg by mouth daily.        . [DISCONTINUED] montelukast (SINGULAIR) 10 MG tablet Take 10 mg by mouth at bedtime.        . [DISCONTINUED] RABEprazole (ACIPHEX) 20 MG tablet Take 20 mg by mouth daily.        . [DISCONTINUED] traZODone (DESYREL) 100 MG tablet Take 100 mg by mouth at bedtime.         No current facility-administered medications on file prior to visit.  She is allergic to penicillins..  Review of Systems Review of Systems  Constitutional: Negative for activity change, appetite change and fatigue.  HENT: Negative for hearing loss, congestion, tinnitus and ear discharge.  dentist q27m Eyes: Negative for visual disturbance (see optho q1y -- vision corrected to 20/20 with glasses).  Respiratory: Negative for cough, chest tightness and shortness of breath.   Cardiovascular: Negative for chest pain, palpitations and leg swelling.  Gastrointestinal: Negative for abdominal pain, diarrhea, constipation and abdominal distention.  Genitourinary: Negative for urgency, frequency, decreased urine volume and difficulty urinating.  Musculoskeletal: Negative  for back pain, arthralgias and gait problem.  Skin: Negative for color change, pallor and rash.  Neurological: Negative for dizziness, light-headedness, numbness and headaches.  Hematological: Negative for adenopathy. Does not bruise/bleed easily.  Psychiatric/Behavioral: Negative for suicidal ideas, confusion, sleep disturbance, self-injury, dysphoric mood, decreased concentration and agitation.       Objective:    BP 110/70  Pulse 67  Temp(Src) 98.3 F (36.8 C) (Oral)  Ht 5\' 2"  (1.575 m)  Wt 138 lb 6.4 oz (62.778 kg)  BMI 25.31 kg/m2  SpO2 97% General appearance: alert, cooperative, appears stated age and no distress Head: Normocephalic, without obvious abnormality, atraumatic Eyes: conjunctivae/corneas clear. PERRL, EOM's intact. Fundi benign. Ears: normal TM's and external ear canals both ears Nose: Nares normal. Septum midline. Mucosa normal. No drainage or sinus tenderness. Throat: lips, mucosa, and tongue normal; teeth and gums normal Neck: no adenopathy, no carotid bruit, no JVD, supple, symmetrical, trachea midline and thyroid not enlarged, symmetric, no tenderness/mass/nodules Back: symmetric, no curvature. ROM normal. No CVA tenderness. Lungs: clear to auscultation bilaterally Breasts: gyn Heart: regular rate and rhythm, S1, S2 normal, no murmur, click, rub or gallop Abdomen: soft, non-tender; bowel sounds normal; no masses,  no organomegaly Pelvic: deferred--gyn Extremities: extremities normal, atraumatic, no cyanosis or edema Pulses: 2+ and symmetric Skin: Skin color, texture, turgor normal. No rashes or lesions Lymph nodes: Cervical, supraclavicular, and axillary nodes normal. Neurologic: Alert and oriented X 3, normal strength and tone. Normal symmetric reflexes. Normal coordination and gait Psych-- no depression, no anxiety      Assessment:    Healthy female exam.      Plan:    ghm utd Check labs---reviewed with pt See After Visit Summary for Counseling  Recommendations

## 2012-03-18 LAB — VITAMIN D 1,25 DIHYDROXY
Vitamin D 1, 25 (OH)2 Total: 91 pg/mL — ABNORMAL HIGH (ref 18–72)
Vitamin D3 1, 25 (OH)2: 91 pg/mL

## 2012-03-19 ENCOUNTER — Ambulatory Visit: Payer: BC Managed Care – PPO | Admitting: Gastroenterology

## 2012-03-22 ENCOUNTER — Encounter: Payer: Self-pay | Admitting: Family Medicine

## 2012-04-01 ENCOUNTER — Other Ambulatory Visit (INDEPENDENT_AMBULATORY_CARE_PROVIDER_SITE_OTHER): Payer: BC Managed Care – PPO

## 2012-04-01 ENCOUNTER — Ambulatory Visit (INDEPENDENT_AMBULATORY_CARE_PROVIDER_SITE_OTHER): Payer: BC Managed Care – PPO | Admitting: Gastroenterology

## 2012-04-01 ENCOUNTER — Encounter: Payer: Self-pay | Admitting: Gastroenterology

## 2012-04-01 VITALS — BP 118/74 | HR 70 | Ht 62.0 in | Wt 141.8 lb

## 2012-04-01 DIAGNOSIS — R197 Diarrhea, unspecified: Secondary | ICD-10-CM

## 2012-04-01 DIAGNOSIS — K589 Irritable bowel syndrome without diarrhea: Secondary | ICD-10-CM

## 2012-04-01 DIAGNOSIS — R109 Unspecified abdominal pain: Secondary | ICD-10-CM

## 2012-04-01 DIAGNOSIS — Z9884 Bariatric surgery status: Secondary | ICD-10-CM

## 2012-04-01 LAB — CBC WITH DIFFERENTIAL/PLATELET
Basophils Relative: 1 % (ref 0.0–3.0)
Eosinophils Absolute: 0.2 10*3/uL (ref 0.0–0.7)
Hemoglobin: 12.3 g/dL (ref 12.0–15.0)
MCHC: 33.7 g/dL (ref 30.0–36.0)
MCV: 89.8 fl (ref 78.0–100.0)
Monocytes Absolute: 0.3 10*3/uL (ref 0.1–1.0)
Neutro Abs: 3.1 10*3/uL (ref 1.4–7.7)
RBC: 4.06 Mil/uL (ref 3.87–5.11)

## 2012-04-01 LAB — IBC PANEL
Iron: 59 ug/dL (ref 42–145)
Saturation Ratios: 12.7 % — ABNORMAL LOW (ref 20.0–50.0)
Transferrin: 333 mg/dL (ref 212.0–360.0)

## 2012-04-01 LAB — MAGNESIUM: Magnesium: 2 mg/dL (ref 1.5–2.5)

## 2012-04-01 LAB — COMPREHENSIVE METABOLIC PANEL
AST: 21 U/L (ref 0–37)
Alkaline Phosphatase: 40 U/L (ref 39–117)
BUN: 12 mg/dL (ref 6–23)
Calcium: 8.8 mg/dL (ref 8.4–10.5)
Creatinine, Ser: 0.7 mg/dL (ref 0.4–1.2)

## 2012-04-01 LAB — HEPATIC FUNCTION PANEL
ALT: 17 U/L (ref 0–35)
Albumin: 3.9 g/dL (ref 3.5–5.2)
Total Protein: 6.6 g/dL (ref 6.0–8.3)

## 2012-04-01 LAB — VITAMIN B12: Vitamin B-12: 329 pg/mL (ref 211–911)

## 2012-04-01 MED ORDER — NA SULFATE-K SULFATE-MG SULF 17.5-3.13-1.6 GM/177ML PO SOLN: ORAL | Status: DC

## 2012-04-01 MED ORDER — CILIDINIUM-CHLORDIAZEPOXIDE 2.5-5 MG PO CAPS: 1.0000 | ORAL_CAPSULE | Freq: Two times a day (BID) | ORAL | Status: DC | PRN

## 2012-04-01 NOTE — Progress Notes (Signed)
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  She apparently had preoperative colonoscopy x2 by Dr. Kinnie Scales , I do not have these procedures for review.  Since her surgery she's had early satiety, abdominal cramping, dumping-type symptoms with postprandial diarrhea requiring up to 2 Imodium a day.  She also has recurrent crampy lower abdominal pain, gas and bloating.  She recently says reflux symptoms and has been placed on Prilosec 20 mg daily by her primary care physician.  She's on multivitamins, vitamin D, B12, and oral iron.  She denies a specific food sensitivities except for lactose intolerance.  She follows a frequent small feedings diet.  She does have a history of chronic ethanol abuse without previous pancreatitis or hepatitis.  Family history is noncontributory.  Recent CT scan of the abdomen was reviewed and was unremarkable.  His had no anorexia, and unwanted weight loss, or specific hepatobiliary complaints.  Upper nominal ultrasound and gallbladder scan has been normal.  There is no history of arthritis, recurrent myalgias, skin rashes, mouth sores.  She denies melena, hematochezia, or nausea and vomiting.  I have reviewed this patient's present history, medical and surgical past history, allergies and medications.     ROS:   All systems were reviewed and are negative unless otherwise stated in the HPI.    Physical Exam: Blood pressure 118/70, pulse 70 and regular, and weight 141 with a BMI of 25.93. General well developed well nourished patient in no acute distress, appearing their stated age Eyes PERRLA, no icterus, fundoscopic exam per opthamologist Skin no lesions noted Neck supple, no adenopathy, no thyroid enlargement, no tenderness Chest clear to percussion and auscultation Heart no significant murmurs, gallops or rubs noted Abdomen no hepatosplenomegaly masses  or tenderness, BS normal.  Rectal inspection normal no fissures, or fistulae noted.  No masses or tenderness on digital exam. Stool guaiac negative. Extremities no acute joint lesions, edema, phlebitis or evidence of cellulitis. Neurologic patient oriented x 3, cranial nerves intact, no focal neurologic deficits noted. Psychological mental status normal and normal affect.  Assessment and plan: This patient is status post bariatric surgery and has subsequent chronic diarrhea with associated needed weight loss which has stabilized.  She has symptoms consistent with late dumping syndrome, lactose intolerance, and also IBS with a long history of stress related diarrhea before her surgery.  I doubt we will uncover any other etiologic cause of her difficulties.  I have scheduled followup colonoscopy and endoscopy with small bowel biopsy.  I placed her on a FOD_MAP diet for IBS with twice a day Librax pending her workup.  Also ordered today were labs including celiac serologies, amylase, lipase, anemia profile, and also stool for Elastase-1 level and ova and parasite exam.  Have asked her continue her Imodium and PPI therapy for now.  There is a long history of alcohol abuse, and I have ordered CDT level and also liver function tests.  There is no history of previous hepatitis or pancreatitis, but the patient does have multiple multiple tattoos.  I cannot appreciate stigmata of chronic liver disease on exam.  I'll send a copy this note to primary care and to Dr. Luretha Murphy, her bariatric surgeon.  Also ordered today were serum magnesium, zinc, and thiamine levels. No diagnosis found.

## 2012-04-01 NOTE — Patient Instructions (Addendum)
You have been scheduled for an endoscopy and colonoscopy with propofol. Please follow the written instructions given to you at your visit today. Please pick up your prep at the pharmacy within the next 1-3 days. If you use inhalers (even only as needed), please bring them with you on the day of your procedure.  Your physician has requested that you go to the basement for lab work and stool studies before leaving today.  We have sent the following medications to your pharmacy for you to pick up at your convenience: Librax, please take as directed.  FOD MAP diet given today, please follow.

## 2012-04-02 ENCOUNTER — Encounter: Payer: Self-pay | Admitting: Gastroenterology

## 2012-04-02 LAB — CELIAC PANEL 10: IgA: 186 mg/dL (ref 69–380)

## 2012-04-03 ENCOUNTER — Other Ambulatory Visit: Payer: Self-pay | Admitting: *Deleted

## 2012-04-03 LAB — ZINC: Zinc: 67 ug/dL (ref 60–130)

## 2012-04-03 MED ORDER — NA SULFATE-K SULFATE-MG SULF 17.5-3.13-1.6 GM/177ML PO SOLN: ORAL | Status: DC

## 2012-04-15 ENCOUNTER — Encounter: Payer: Self-pay | Admitting: Gastroenterology

## 2012-04-15 ENCOUNTER — Ambulatory Visit (AMBULATORY_SURGERY_CENTER): Payer: BC Managed Care – PPO | Admitting: Gastroenterology

## 2012-04-15 VITALS — BP 112/74 | HR 59 | Temp 99.0°F | Resp 20 | Ht 62.0 in | Wt 141.0 lb

## 2012-04-15 DIAGNOSIS — D1330 Benign neoplasm of unspecified part of small intestine: Secondary | ICD-10-CM

## 2012-04-15 DIAGNOSIS — D126 Benign neoplasm of colon, unspecified: Secondary | ICD-10-CM

## 2012-04-15 DIAGNOSIS — Z1211 Encounter for screening for malignant neoplasm of colon: Secondary | ICD-10-CM

## 2012-04-15 DIAGNOSIS — Z9884 Bariatric surgery status: Secondary | ICD-10-CM

## 2012-04-15 DIAGNOSIS — R109 Unspecified abdominal pain: Secondary | ICD-10-CM

## 2012-04-15 DIAGNOSIS — R197 Diarrhea, unspecified: Secondary | ICD-10-CM

## 2012-04-15 MED ORDER — SODIUM CHLORIDE 0.9 % IV SOLN: 500.0000 mL | INTRAVENOUS | Status: DC

## 2012-04-15 NOTE — Op Note (Signed)
[facility] [address] [place], [address]   ENDOSCOPY PROCEDURE REPORT  PATIENT: [name], Mary  MR#: [id number] BIRTHDATE: [birth year] , 47  yrs. old GENDER: Female ENDOSCOPIST:Dyllan Hughett Hale Bogus, MD, [facility] REFERRED BY: PROCEDURE DATE:  04/15/2012 PROCEDURE:   EGD w/ biopsy ASA CLASS:    Class II INDICATIONS: prior bariatric surgery and Chronic diarrhea. MEDICATION: There was residual sedation effect present from prior procedure, Propofol (Diprivan), and propofol (Diprivan) 200mg  IV TOPICAL ANESTHETIC:  DESCRIPTION OF PROCEDURE:   After the risks and benefits of the procedure were explained, informed consent was obtained.  The [facility] GIF-H180 [id number]  endoscope was introduced through the mouth  and advanced to the proximal jejunum .  The instrument was slowly withdrawn as the mucosa was fully examined.      ESOPHAGUS: The mucosa of the esophagus appeared normal.  STOMACH: Gastrojejunostomy widely open..see pictures.2 visible sutures but no ulcres,bleeding,or obstruction.the small bowel appears normal and random biopsies done.   Gastrojejunostomy widely open..see pictures.2 visible sutures but no ulcres,bleeding,or obstruction.the small bowel appears normal and random biopsies done.             The scope was then withdrawn from the patient and the procedure completed.  COMPLICATIONS: There were no complications.   ENDOSCOPIC IMPRESSION: 1.   The mucosa of the esophagus appeared normal 2.   Gastrojejunostomy widely open..see pictures.2 visible sutures but no ulcres,bleeding,or obstruction.the small bowel appears normal and random biopsies done...   RECOMMENDATIONS: 1.  Await biopsy results 2.  Continue current medications    _______________________________ eSigned:  Mardella Layman, MD, [facility] 04/15/2012 2:00 PM

## 2012-04-15 NOTE — Progress Notes (Addendum)
Patient did not have preoperative order for IV antibiotic SSI prophylaxis. (G8918)  Patient did not experience any of the following events: a burn prior to discharge; a fall within the facility; wrong site/side/patient/procedure/implant event; or a hospital transfer or hospital admission upon discharge from the facility. (G8907)  

## 2012-04-15 NOTE — Op Note (Signed)
[facility] [address] [place], [address]   COLONOSCOPY PROCEDURE REPORT  PATIENT: [name], [name]  MR#: [id number] BIRTHDATE: [birth year] , 47  yrs. old GENDER: Female ENDOSCOPIST: Mardella Layman, MD, [facility] REFERRED BY: PROCEDURE DATE:  04/15/2012 PROCEDURE:   Colonoscopy with biopsy ASA CLASS:   Class II INDICATIONS:Average risk patient for colon cancer. MEDICATIONS: propofol (Diprivan) 300mg  IV  DESCRIPTION OF PROCEDURE:   After the risks and benefits and of the procedure were explained, informed consent was obtained.  A digital rectal exam revealed no abnormalities of the rectum.    The [facility] CF-H180AL [id number]  endoscope was introduced through the anus and advanced to the cecum, which was identified by both the appendix and ileocecal valve .  The quality of the prep was excellent, using MoviPrep .  The instrument was then slowly withdrawn as the colon was fully examined.     COLON FINDINGS: A normal appearing cecum, ileocecal valve, and appendiceal orifice were identified.  The ascending, hepatic flexure, transverse, splenic flexure, descending, sigmoid colon and rectum appeared unremarkable.  No polyps or cancers were seen. Multiple random biopsies of the area were performed. Retroflexed views revealed no abnormalities.     The scope was then withdrawn from the patient and the procedure completed.  COMPLICATIONS: There were no complications. ENDOSCOPIC IMPRESSION: Normal colon; multiple random biopsies of the area were performed  RECOMMENDATIONS: 1.  Await pathology results 2.  Continue current colorectal screening recommendations for "routine risk" patients with a repeat colonoscopy in 10 years. 3.  Continue current medications   REPEAT EXAM:  XB:[id number] Daphine Deutscher, MD  _______________________________ eSigned:  Mardella Layman, MD, [facility] 04/15/2012 1:49 PM

## 2012-04-15 NOTE — Patient Instructions (Addendum)
YOU HAD AN ENDOSCOPIC PROCEDURE TODAY AT THE [facility]: Refer to the procedure report that was given to you for any specific questions about what was found during the examination.  If the procedure report does not answer your questions, please call your gastroenterologist to clarify.  If you requested that your care partner not be given the details of your procedure findings, then the procedure report has been included in a sealed envelope for you to review at your convenience later.  YOU SHOULD EXPECT: Some feelings of bloating in the abdomen. Passage of more gas than usual.  Walking can help get rid of the air that was put into your GI tract during the procedure and reduce the bloating. If you had a lower endoscopy (such as a colonoscopy or flexible sigmoidoscopy) you may notice spotting of blood in your stool or on the toilet paper. If you underwent a bowel prep for your procedure, then you may not have a normal bowel movement for a few days.  DIET: Your first meal following the procedure should be a light meal and then it is ok to progress to your normal diet.  A half-sandwich or bowl of soup is an example of a good first meal.  Heavy or fried foods are harder to digest and may make you feel nauseous or bloated.  Likewise meals heavy in dairy and vegetables can cause extra gas to form and this can also increase the bloating.  Drink plenty of fluids but you should avoid alcoholic beverages for 24 hours.  ACTIVITY: Your care partner should take you home directly after the procedure.  You should plan to take it easy, moving slowly for the rest of the day.  You can resume normal activity the day after the procedure however you should NOT DRIVE or use heavy machinery for 24 hours (because of the sedation medicines used during the test).    SYMPTOMS TO REPORT IMMEDIATELY: A gastroenterologist can be reached at any hour.  During normal business hours, 8:30 AM to 5:00 PM Monday through Friday,  call [phone].  After hours and on weekends, please call the GI answering service at [phone] who will take a message and have the physician on call contact you.   Following lower endoscopy (colonoscopy or flexible sigmoidoscopy):  Excessive amounts of blood in the stool  Significant tenderness or worsening of abdominal pains  Swelling of the abdomen that is new, acute  Fever of 100F or higher  Following upper endoscopy (EGD)  Vomiting of blood or coffee ground material  New chest pain or pain under the shoulder blades  Painful or persistently difficult swallowing  New shortness of breath  Fever of 100F or higher  Black, tarry-looking stools  FOLLOW UP: If any biopsies were taken you will be contacted by phone or by letter within the next 1-3 weeks.  Call your gastroenterologist if you have not heard about the biopsies in 3 weeks.  Our staff will call the home number listed on your records the next business day following your procedure to check on you and address any questions or concerns that you may have at that time regarding the information given to you following your procedure. This is a courtesy call and so if there is no answer at the home number and we have not heard from you through the emergency physician on call, we will assume that you have returned to your regular daily activities without incident.  SIGNATURES/CONFIDENTIALITY: You and/or your care   partner have signed paperwork which will be entered into your electronic medical record.  These signatures attest to the fact that that the information above on your After Visit Summary has been reviewed and is understood.  Full responsibility of the confidentiality of this discharge information lies with you and/or your care-partner.  

## 2012-04-16 ENCOUNTER — Telehealth: Payer: Self-pay | Admitting: *Deleted

## 2012-04-16 NOTE — Telephone Encounter (Signed)
  Follow up Call-  Call back number 04/15/2012  Post procedure Call Back phone  # [phone]  Permission to leave phone message Yes     Patient questions:  Message left to call us if necessary.

## 2012-04-18 ENCOUNTER — Other Ambulatory Visit: Payer: BC Managed Care – PPO

## 2012-04-18 DIAGNOSIS — R109 Unspecified abdominal pain: Secondary | ICD-10-CM

## 2012-04-18 DIAGNOSIS — R197 Diarrhea, unspecified: Secondary | ICD-10-CM

## 2012-04-19 LAB — FECAL FAT QUALITATIVE
Free Fatty Acids: NORMAL
NEUTRAL FAT: NORMAL

## 2012-04-22 ENCOUNTER — Encounter: Payer: Self-pay | Admitting: Gastroenterology

## 2012-07-16 ENCOUNTER — Telehealth: Payer: Self-pay | Admitting: Family Medicine

## 2012-07-16 NOTE — Telephone Encounter (Signed)
Patient is calling requesting to speak with Selena Batten about possibly being able to her Trimethoprim medication refilled.

## 2012-07-16 NOTE — Telephone Encounter (Signed)
Refill x1---  5 refills 

## 2012-07-16 NOTE — Telephone Encounter (Signed)
Rx not filled here. Please advise      KP

## 2012-07-17 ENCOUNTER — Encounter (INDEPENDENT_AMBULATORY_CARE_PROVIDER_SITE_OTHER): Payer: Self-pay | Admitting: Surgery

## 2012-07-17 MED ORDER — TRIMETHOPRIM 100 MG PO TABS: 100.0000 mg | ORAL_TABLET | Freq: Two times a day (BID) | ORAL | Status: DC

## 2012-07-17 NOTE — Telephone Encounter (Signed)
Msg left advising Rx sent to the pharmacy.    KP 

## 2012-08-02 ENCOUNTER — Ambulatory Visit (INDEPENDENT_AMBULATORY_CARE_PROVIDER_SITE_OTHER): Payer: BC Managed Care – PPO | Admitting: Family Medicine

## 2012-08-02 ENCOUNTER — Encounter: Payer: Self-pay | Admitting: Family Medicine

## 2012-08-02 VITALS — BP 114/68 | HR 94 | Temp 98.2°F | Wt 133.0 lb

## 2012-08-02 DIAGNOSIS — M799 Soft tissue disorder, unspecified: Secondary | ICD-10-CM

## 2012-08-02 DIAGNOSIS — D2360 Other benign neoplasm of skin of unspecified upper limb, including shoulder: Secondary | ICD-10-CM

## 2012-08-02 DIAGNOSIS — M7989 Other specified soft tissue disorders: Secondary | ICD-10-CM

## 2012-08-02 DIAGNOSIS — D367 Benign neoplasm of other specified sites: Secondary | ICD-10-CM

## 2012-08-02 NOTE — Assessment & Plan Note (Signed)
r elbow Refer to surgery for evaluation-- secondary to pt c/o pain

## 2012-08-02 NOTE — Progress Notes (Signed)
  Subjective:    Patient ID: [name], female    DOB: 03-11-1965, 47 y.o.   MRN: [id number]  HPI Pt here c/o small knot close to R elbow that is causing pain.  No known injury. She noticed it about 4 weeks ago.  It is progressively getting worse.     Review of Systems As above    Objective:   Physical Exam  BP 114/68  Pulse 94  Temp(Src) 98.2 F (36.8 C) (Oral)  Wt 133 lb (60.328 kg)  BMI 24.32 kg/m2  SpO2 99% General appearance: alert, cooperative, appears stated age and no distress Extremities: r arm-- pea sized lesion close to elbow-- tender to touch, mobile , superficial        Assessment & Plan:

## 2012-08-07 ENCOUNTER — Telehealth (INDEPENDENT_AMBULATORY_CARE_PROVIDER_SITE_OTHER): Payer: Self-pay | Admitting: General Surgery

## 2012-08-07 NOTE — Telephone Encounter (Signed)
LMOM for patient to call back to ask for [name] I need to move her off of Dr Corliss Skains schedule and need to Dr Daphine Deutscher schedule. She is a Theatre manager patient

## 2012-08-13 ENCOUNTER — Ambulatory Visit (INDEPENDENT_AMBULATORY_CARE_PROVIDER_SITE_OTHER): Payer: BC Managed Care – PPO | Admitting: Surgery

## 2012-08-29 ENCOUNTER — Other Ambulatory Visit: Payer: Self-pay | Admitting: *Deleted

## 2012-08-29 DIAGNOSIS — R109 Unspecified abdominal pain: Secondary | ICD-10-CM

## 2012-08-29 MED ORDER — OMEPRAZOLE 20 MG PO CPDR: 20.0000 mg | DELAYED_RELEASE_CAPSULE | Freq: Every day | ORAL | Status: DC

## 2012-08-29 NOTE — Telephone Encounter (Signed)
Rx was refilled for omeprazole 20 mg.  08/29/12 AG cma

## 2012-08-30 ENCOUNTER — Other Ambulatory Visit: Payer: Self-pay | Admitting: *Deleted

## 2012-08-30 MED ORDER — ROPINIROLE HCL 0.25 MG PO TABS: 0.2500 mg | ORAL_TABLET | Freq: Three times a day (TID) | ORAL | Status: DC

## 2012-08-30 NOTE — Telephone Encounter (Signed)
Rx filled for ropinirole  0.25 mg AG cma

## 2012-09-06 ENCOUNTER — Encounter (INDEPENDENT_AMBULATORY_CARE_PROVIDER_SITE_OTHER): Payer: Self-pay | Admitting: Surgery

## 2012-09-06 ENCOUNTER — Ambulatory Visit (INDEPENDENT_AMBULATORY_CARE_PROVIDER_SITE_OTHER): Payer: BC Managed Care – PPO | Admitting: Surgery

## 2012-09-06 VITALS — BP 124/72 | HR 68 | Temp 98.1°F | Resp 16 | Ht 62.0 in | Wt 132.4 lb

## 2012-09-06 DIAGNOSIS — Z9884 Bariatric surgery status: Secondary | ICD-10-CM

## 2012-09-06 NOTE — Patient Instructions (Signed)
See Dr. Cindee Salt regarding your forearm pain.

## 2012-09-06 NOTE — Progress Notes (Signed)
[name] 47 y.o.  Body mass index is 24.21 kg/(m^2).  Patient Active Problem List   Diagnosis Date Noted  . Mass of soft tissue 08/02/2012  . RESTLESS LEGS SYNDROME 01/21/2009  . LYMPHOPENIA 03/23/2008  . DEPRESSION 02/27/2008  . HEMANGIOMA, SKIN 02/04/2008  . 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 Codeine Itching  . Penicillins Other (See Comments)    Happened in childhood at 47 years of age, unsure of reaction.    Past Surgical History  Procedure Laterality Date  . Abdominal hysterectomy      fibroids  . Gastric bypass    . Tubal ligation     Loreen Freud, DO No diagnosis found.  [name] comes in today for evaluation of a tender knot in the extensor tendon of her right forearm.  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 She had sustained an injury earlier to add another area did notice this point of tenderness and with certain positions she can feel a subtle little knot that she describes is very tender. My immediate a recommendation would be to plus some kind of general compression to the area and I am not certain that excision would be the right therapy.  I would like to refer her to Cindee Salt to see for an opinion.     She has lost 120 lbs since her gastric bypass in October 2007.  She has no gastric problems but will get cramps in the anterior abdominal wall when bending and stretching at work.  She can feel the abdominal wall knot when this pain occurs.  This is not an area of trocar placement and I think is likely more related to her work. I'll be glad to see her again in one year in routine followup from her bariatric surgery. Matt B. Daphine Deutscher, MD, [facility] [phone] beeper [phone]  09/06/2012 10:55 AM

## 2012-09-10 ENCOUNTER — Telehealth: Payer: Self-pay | Admitting: *Deleted

## 2012-09-10 NOTE — Telephone Encounter (Signed)
FMLA paperwork completed and given to provider for signature.

## 2012-09-11 DIAGNOSIS — Z0279 Encounter for issue of other medical certificate: Secondary | ICD-10-CM

## 2012-09-25 ENCOUNTER — Ambulatory Visit (INDEPENDENT_AMBULATORY_CARE_PROVIDER_SITE_OTHER): Payer: BC Managed Care – PPO | Admitting: Surgery

## 2012-10-02 ENCOUNTER — Other Ambulatory Visit: Payer: Self-pay | Admitting: Family Medicine

## 2012-11-28 ENCOUNTER — Other Ambulatory Visit: Payer: Self-pay

## 2012-12-20 ENCOUNTER — Other Ambulatory Visit: Payer: Self-pay | Admitting: Family Medicine

## 2013-01-18 ENCOUNTER — Other Ambulatory Visit: Payer: Self-pay | Admitting: Family Medicine

## 2013-01-22 ENCOUNTER — Ambulatory Visit (INDEPENDENT_AMBULATORY_CARE_PROVIDER_SITE_OTHER): Payer: BC Managed Care – PPO | Admitting: Family Medicine

## 2013-01-22 ENCOUNTER — Encounter: Payer: Self-pay | Admitting: Family Medicine

## 2013-01-22 VITALS — BP 114/68 | HR 85 | Temp 98.6°F | Wt 136.0 lb

## 2013-01-22 DIAGNOSIS — D1800 Hemangioma unspecified site: Secondary | ICD-10-CM

## 2013-01-22 DIAGNOSIS — D1801 Hemangioma of skin and subcutaneous tissue: Secondary | ICD-10-CM

## 2013-01-22 DIAGNOSIS — G2581 Restless legs syndrome: Secondary | ICD-10-CM

## 2013-01-22 MED ORDER — ROPINIROLE HCL 1 MG PO TABS: 1.0000 mg | ORAL_TABLET | Freq: Every day | ORAL | Status: DC

## 2013-01-22 MED ORDER — FERRALET 90 90-1 MG PO TABS: ORAL_TABLET | ORAL | Status: DC

## 2013-01-22 NOTE — Assessment & Plan Note (Signed)
Reassured pt that they are benign

## 2013-01-22 NOTE — Progress Notes (Signed)
   Subjective:    Patient ID: [name], female    DOB: [birth year], 47 y.o.   MRN: [id number]  HPI Pt here c/o red spots on skin----they have been there a while. Pt also c/o RLS acting up.  requip 0.25 3 qhs not working.      Review of Systems As above    Objective:   Physical Exam  BP 114/68  Pulse 85  Temp(Src) 98.6 F (37 C) (Oral)  Wt 136 lb (61.689 kg)  SpO2 98% General appearance: alert, cooperative, appears stated age and no distress Skin: hemangiomas--scattered on back , chest , arms      Assessment & Plan:

## 2013-01-22 NOTE — Assessment & Plan Note (Signed)
Increase requip 1 mg po qhs

## 2013-01-22 NOTE — Patient Instructions (Signed)
Cherry Angioma  Cherry angiomas are noncancerous (benign) skin growths. They are made up of a clump of blood vessels. They occur most often in people over the age of 30.  CAUSES   The cause of these skin growths is unknown, but they appear to run in families.  SYMPTOMS   Cherry angiomas are smooth, round, red bumps on the skin. They can be as small as a pinhead or as big as a pencil eraser. The color may darken to a purplish red over time. Cherry angiomas are usually found on the trunk, but they can occur anywhere on the body. They are painless, but they may bleed if they are injured. The bleeding is not serious and will stop when firm pressure is applied.  DIAGNOSIS   Your caregiver can usually tell what is wrong by performing a physical exam. A tissue sample (biopsy) may also be taken and examined under a microscope.  TREATMENT   Usually no treatment is needed for cherry angiomas. They may be removed for improved appearance (cosmetic) reasons. Sometimes, cherry angiomas come back after removal. Removal methods include:   Electrocautery. Heat is used to burn the growth off the skin.   Cryosurgery. Liquid nitrogen is applied to the growth to freeze it. The growth eventually falls off the skin.   Surgery.  HOME CARE INSTRUCTIONS   If your skin was covered with a bandage, change and remove the bandage as directed by your caregiver.   Check your skin regularly for any changes.  SEEK MEDICAL CARE IF:  You notice any changes or new growths on your skin.  Document Released: 03/20/2001 Document Revised: 04/03/2011 Document Reviewed: 02/17/2011  ExitCare Patient Information 2014 [employer].

## 2013-01-22 NOTE — Progress Notes (Signed)
Pre visit review using our clinic review tool, if applicable. No additional management support is needed unless otherwise documented below in the visit note. 

## 2013-03-13 ENCOUNTER — Other Ambulatory Visit: Payer: Self-pay | Admitting: Obstetrics and Gynecology

## 2013-03-13 DIAGNOSIS — R928 Other abnormal and inconclusive findings on diagnostic imaging of breast: Secondary | ICD-10-CM

## 2013-03-27 ENCOUNTER — Ambulatory Visit
Admission: RE | Admit: 2013-03-27 | Discharge: 2013-03-27 | Disposition: A | Discharge status: Home / Self Care | Payer: BC Managed Care – PPO | Source: Ambulatory Visit | Attending: Obstetrics and Gynecology | Admitting: Obstetrics and Gynecology

## 2013-03-27 DIAGNOSIS — R928 Other abnormal and inconclusive findings on diagnostic imaging of breast: Secondary | ICD-10-CM

## 2013-04-14 ENCOUNTER — Other Ambulatory Visit: Payer: Self-pay | Admitting: Family Medicine

## 2013-04-16 ENCOUNTER — Encounter: Payer: Self-pay | Admitting: Family Medicine

## 2013-04-16 ENCOUNTER — Ambulatory Visit (INDEPENDENT_AMBULATORY_CARE_PROVIDER_SITE_OTHER): Payer: BC Managed Care – PPO | Admitting: Family Medicine

## 2013-04-16 VITALS — BP 102/76 | HR 81 | Temp 98.3°F | Resp 16 | Wt 142.1 lb

## 2013-04-16 DIAGNOSIS — J309 Allergic rhinitis, unspecified: Secondary | ICD-10-CM

## 2013-04-16 DIAGNOSIS — W540XXA Bitten by dog, initial encounter: Secondary | ICD-10-CM

## 2013-04-16 DIAGNOSIS — S0185XA Open bite of other part of head, initial encounter: Secondary | ICD-10-CM | POA: Insufficient documentation

## 2013-04-16 DIAGNOSIS — K5289 Other specified noninfective gastroenteritis and colitis: Secondary | ICD-10-CM

## 2013-04-16 DIAGNOSIS — S0180XA Unspecified open wound of other part of head, initial encounter: Secondary | ICD-10-CM

## 2013-04-16 DIAGNOSIS — K529 Noninfective gastroenteritis and colitis, unspecified: Secondary | ICD-10-CM | POA: Insufficient documentation

## 2013-04-16 MED ORDER — SULFAMETHOXAZOLE-TMP DS 800-160 MG PO TABS: 1.0000 | ORAL_TABLET | Freq: Two times a day (BID) | ORAL | Status: DC

## 2013-04-16 NOTE — Progress Notes (Signed)
Pre visit review using our clinic review tool, if applicable. No additional management support is needed unless otherwise documented below in the visit note. 

## 2013-04-16 NOTE — Assessment & Plan Note (Signed)
New.  Reviewed that pt's nausea, diarrhea, and HA are unlikely to be due to a sinus infection as she does not have traditional sxs or PE findings.  immodium prn.  Increase fluid intake.  Tylenol as needed for HA.  Reviewed supportive care and red flags that should prompt return.  Pt expressed understanding and is in agreement w/ plan.

## 2013-04-16 NOTE — Patient Instructions (Signed)
Follow up as needed Start the Bactrim twice daily- take w/ food- to cover possible dog bite infection Drink plenty of fluids Claritin/Zyrtec daily to decrease congestion and post-nasal drip Continue the immodium as needed Call with any questions or concerns Hang in there!!

## 2013-04-16 NOTE — Assessment & Plan Note (Signed)
New.  Pt w/ PE findings consistent w/ rhinitis.  Start daily antihistamine to decrease PND.  Reviewed supportive care and red flags that should prompt return.  Pt expressed understanding and is in agreement w/ plan.

## 2013-04-16 NOTE — Progress Notes (Signed)
   Subjective:    Patient ID: [name], female    DOB: 14-Apr-1965, 48 y.o.   MRN: [id number]  Headache  Associated symptoms include dizziness.  Dizziness Associated symptoms include headaches.  Animal Bite  Associated symptoms include headaches.   URI- hx of recurrent sinus infxns.  Now w/ 'bad HA', nausea, diarrhea.  + bloody nasal drainage.  No tooth pain, no fevers, no ear pain.  Minimal cough, some hoarseness.  sxs started 4 days ago.  Dog bite- occurred on Saturday, dog UTD on rabies vaccine, pt UTD on tetanus.  + pain and swelling over bite on L jaw.   Review of Systems  Neurological: Positive for dizziness and headaches.   For ROS see HPI     Objective:   Physical Exam  Vitals reviewed. Constitutional: She is oriented to person, place, and time. She appears well-developed and well-nourished. No distress.  HENT:  Head: Normocephalic.  Right Ear: Tympanic membrane normal.  Left Ear: Tympanic membrane normal.  Nose: Mucosal edema and rhinorrhea present. Right sinus exhibits no maxillary sinus tenderness and no frontal sinus tenderness. Left sinus exhibits no maxillary sinus tenderness and no frontal sinus tenderness.  Mouth/Throat: Mucous membranes are normal. Posterior oropharyngeal erythema (w/ PND) present.  Well healing bite over L mandible w/o surround erythema or induration, + TTP  Eyes: Conjunctivae and EOM are normal. Pupils are equal, round, and reactive to light.  Neck: Normal range of motion. Neck supple.  Cardiovascular: Normal rate, regular rhythm and normal heart sounds.   Pulmonary/Chest: Effort normal and breath sounds normal. No respiratory distress. She has no wheezes. She has no rales.  Abdominal: Soft. Bowel sounds are normal. She exhibits no distension. There is no tenderness. There is no rebound.  Lymphadenopathy:    She has no cervical adenopathy.  Neurological: She is alert and oriented to person, place, and time.  Skin: Skin is warm and dry.  No erythema.          Assessment & Plan:

## 2013-04-16 NOTE — Assessment & Plan Note (Signed)
New.  Dog is UTD on rabies.  Pt UTD on tetanus.  No evidence of cellulitis but due to pain over L mandible, will start bactrim to cover for possible infxn.  Reviewed supportive care and red flags that should prompt return.  Pt expressed understanding and is in agreement w/ plan.

## 2013-04-21 ENCOUNTER — Encounter: Payer: Self-pay | Admitting: Family Medicine

## 2013-04-21 ENCOUNTER — Ambulatory Visit (INDEPENDENT_AMBULATORY_CARE_PROVIDER_SITE_OTHER): Payer: BC Managed Care – PPO | Admitting: Family Medicine

## 2013-04-21 ENCOUNTER — Ambulatory Visit (HOSPITAL_BASED_OUTPATIENT_CLINIC_OR_DEPARTMENT_OTHER)
Admission: RE | Admit: 2013-04-21 | Discharge: 2013-04-21 | Disposition: A | Discharge status: Home / Self Care | Payer: BC Managed Care – PPO | Source: Ambulatory Visit | Attending: Family Medicine | Admitting: Family Medicine

## 2013-04-21 VITALS — BP 124/80 | HR 65 | Temp 98.3°F | Ht 64.0 in | Wt 139.0 lb

## 2013-04-21 DIAGNOSIS — M79609 Pain in unspecified limb: Secondary | ICD-10-CM | POA: Insufficient documentation

## 2013-04-21 DIAGNOSIS — M79672 Pain in left foot: Secondary | ICD-10-CM

## 2013-04-21 NOTE — Progress Notes (Signed)
  Subjective:    [name] is a 48 y.o. female who presents with left foot pain. Onset of the symptoms was several weeks ago. Precipitating event: hx fracture 2 and 3 toe about 2 years agol. Current symptoms include: ability to bear weight, but with some pain and worsening symptoms after a period of activity. Aggravating factors: any weight bearing and standing. Symptoms have gradually worsened. Patient has had no prior foot problems. Evaluation to date: none. Treatment to date: none.  The following portions of the patient's history were reviewed and updated as appropriate: allergies, current medications, past family history, past medical history, past social history, past surgical history and problem list.  Review of Systems Pertinent items are noted in HPI.    Objective:    BP 124/80  Pulse 65  Temp(Src) 98.3 F (36.8 C) (Oral)  Ht 5\' 4"  (1.626 m)  Wt 139 lb (63.05 kg)  BMI 23.85 kg/m2  SpO2 97% Right foot:  normal exam, no swelling, tenderness, instability; ligaments intact, full range of motion of all ankle/foot joints  Left foot:  normal exam, no swelling, tenderness, instability; ligaments intact, full range of motion of all ankle/foot joints   Imaging: X-ray of the left foot: not available    Assessment:    Non-specific foot pain    Plan:    Natural history and expected course discussed. Questions answered. Neurosurgeon distributed. Rest, ice, compression, and elevation (RICE) therapy. OTC analgesics as needed. xray ordered, take tylenol arthritis prn (pt unable to take Nsaids secondary to bariatric surgery)

## 2013-04-21 NOTE — Progress Notes (Signed)
Pre visit review using our clinic review tool, if applicable. No additional management support is needed unless otherwise documented below in the visit note. 

## 2013-04-21 NOTE — Patient Instructions (Signed)
Get your xray and Med [facility] Take otc tylenol arthritis Return as needed

## 2013-05-12 ENCOUNTER — Other Ambulatory Visit: Payer: Self-pay | Admitting: Family Medicine

## 2013-06-25 ENCOUNTER — Encounter: Payer: Self-pay | Admitting: Physician Assistant

## 2013-06-25 ENCOUNTER — Ambulatory Visit (INDEPENDENT_AMBULATORY_CARE_PROVIDER_SITE_OTHER): Payer: BC Managed Care – PPO | Admitting: Physician Assistant

## 2013-06-25 VITALS — BP 110/80 | HR 62 | Temp 98.2°F | Resp 14 | Ht 64.0 in | Wt 139.0 lb

## 2013-06-25 DIAGNOSIS — J329 Chronic sinusitis, unspecified: Secondary | ICD-10-CM | POA: Insufficient documentation

## 2013-06-25 MED ORDER — AZITHROMYCIN 250 MG PO TABS: ORAL_TABLET | ORAL | Status: DC

## 2013-06-25 MED ORDER — BENZONATATE 100 MG PO CAPS: 100.0000 mg | ORAL_CAPSULE | Freq: Two times a day (BID) | ORAL | Status: DC | PRN

## 2013-06-25 NOTE — Progress Notes (Signed)
Patient presents to clinic today c/o sinus pressure, sinus pain, ear pain and tooth pain for several days.  Ear pain has worsened over the past day.  Patient now with voice hoarseness.  Denies SOB or wheezing, but does note occasional cough.  Denies recent travel or sick contact.  Past Medical History  Diagnosis Date  . Allergy   . GERD (gastroesophageal reflux disease)   . Depression   . Anemia   . Anxiety   . Asthma   . Heart murmur     Current Outpatient Prescriptions on File Prior to Visit  Medication Sig Dispense Refill  . ALPRAZolam (XANAX) 1 MG tablet       . Cholecalciferol (VITAMIN D) 2000 UNITS CAPS Take 1 capsule by mouth daily.        . clidinium-chlordiazePOXIDE (LIBRAX) 2.5-5 MG per capsule Take 1 capsule by mouth 2 (two) times daily as needed.  60 capsule  0  . cyclobenzaprine (FLEXERIL) 10 MG tablet Take 1 tablet (10 mg total) by mouth 3 (three) times daily as needed.  30 tablet  0  . desipramine (NORPRAMIN) 50 MG tablet Take 1 tablet by mouth daily.      Marland Kitchen dextroamphetamine (DEXTROSTAT) 10 MG tablet Take 10 mg by mouth 3 (three) times daily.      Marland Kitchen Fe Cbn-Fe Gluc-FA-B12-C-DSS (FERRALET 90) 90-1 MG TABS 1 po qd  30 each  11  . FERROUS SULFATE PO Take 1 tablet by mouth daily.        Marland Kitchen HYDROcodone-acetaminophen (VICODIN) 5-500 MG per tablet       . loperamide (ANTI-DIARRHEAL) 2 MG capsule Take 2 mg by mouth 3 (three) times daily.      Marland Kitchen omeprazole (PRILOSEC) 20 MG capsule TAKE 1 CAPSULE (20 MG TOTAL) BY MOUTH DAILY.  30 capsule  3  . rOPINIRole (REQUIP) 1 MG tablet Take 1 tablet (1 mg total) by mouth at bedtime.  30 tablet  5  . Sertraline HCl (ZOLOFT PO) Take 100 mg by mouth daily.       Marland Kitchen trimethoprim (TRIMPEX) 100 MG tablet TAKE 1 TABLET (100 MG TOTAL) BY MOUTH 2 (TWO) TIMES DAILY.  60 tablet  2  . vitamin B-12 (CYANOCOBALAMIN) 1000 MCG tablet Take 1,000 mcg by mouth daily.        Marland Kitchen VYVANSE 70 MG capsule Take 1 capsule by mouth daily.      . [DISCONTINUED]  Azelastine-Fluticasone (DYMISTA) 137-50 MCG/ACT SUSP Place 1 spray into the nose 2 (two) times daily.      . [DISCONTINUED] loratadine (CLARITIN) 10 MG tablet Take 10 mg by mouth daily.        . [DISCONTINUED] montelukast (SINGULAIR) 10 MG tablet Take 10 mg by mouth at bedtime.        . [DISCONTINUED] RABEprazole (ACIPHEX) 20 MG tablet Take 20 mg by mouth daily.        . [DISCONTINUED] traZODone (DESYREL) 100 MG tablet Take 100 mg by mouth at bedtime.         No current facility-administered medications on file prior to visit.    Allergies  Allergen Reactions  . Codeine Itching  . Penicillins Other (See Comments)    Happened in childhood at 48 years of age, unsure of reaction.    Family History  Problem Relation Age of Onset  . Hypertension Father   . Cancer Father     Prostate  . Depression Sister   . Depression Brother   . Diabetes Maternal Grandmother   .  Diabetes Maternal Grandfather   . Liver disease Maternal Grandfather   . Cancer Maternal Grandfather     Prostate  . Diabetes Paternal Grandmother   . Diabetes Paternal Grandfather   . Cancer Paternal Grandfather     Lung, Prostate  . Stroke Mother     History   Social History  . Marital Status: Married    Spouse Name: N/A    Number of Children: N/A  . Years of Education: N/A   Occupational History  .      [employer]   Social History Main Topics  . Smoking status: Former Smoker    Quit date: 01/24/1987  . Smokeless tobacco: Never Used  . Alcohol Use: Yes     Comment: occasionally  . Drug Use: No  . Sexual Activity: Not Currently   Other Topics Concern  . None   Social History Narrative   Regular exercise--yes   Review of Systems - See HPI.  All other ROS are negative.  BP 110/80  Pulse 62  Temp(Src) 98.2 F (36.8 C) (Oral)  Resp 14  Ht 5\' 4"  (1.626 m)  Wt 139 lb (63.05 kg)  BMI 23.85 kg/m2  SpO2 99%  Physical Exam  Vitals reviewed. Constitutional: She is oriented to person, place,  and time and well-developed, well-nourished, and in no distress.  HENT:  Head: Normocephalic and atraumatic.  Right Ear: External ear normal.  Left Ear: External ear normal.  Nose: Nose normal.  Mouth/Throat: Oropharynx is clear and moist. No oropharyngeal exudate.  + TTP of right frontal sinus and bilateral maxillary sinuses. R TM is cloudy but without erythema, retraction or bulging.  L TM within normal limits.  Eyes: Conjunctivae are normal. Pupils are equal, round, and reactive to light.  Neck: Neck supple.  Cardiovascular: Normal rate, regular rhythm, normal heart sounds and intact distal pulses.   Pulmonary/Chest: Effort normal and breath sounds normal. No respiratory distress. She has no wheezes. She has no rales. She exhibits no tenderness.  Lymphadenopathy:    She has no cervical adenopathy.  Neurological: She is alert and oriented to person, place, and time.  Skin: Skin is warm and dry. No rash noted.  Psychiatric: Affect normal.   Assessment/Plan: Sinusitis Rx azithromycin.  Increase fluids.  Saline nasal spray. Rx Tessalon perles for cough. Daily probiotic.  Hold Trimethoprim while on Z-pack.  Can resume for UTI prophylaxis after d/c Z-pack.

## 2013-06-25 NOTE — Progress Notes (Signed)
Pre visit review using our clinic review tool, if applicable. No additional management support is needed unless otherwise documented below in the visit note/SLS  

## 2013-06-25 NOTE — Patient Instructions (Signed)
Please take antibiotic as directed.  Increase fluid intake.  Use Saline nasal spray.  Take a daily multivitamin. Use Tessalon Perles as directed for cough.  Place a humidifier in the bedroom.  Please call or return clinic if symptoms are not improving.  Sinusitis Sinusitis is redness, soreness, and swelling (inflammation) of the paranasal sinuses. Paranasal sinuses are air pockets within the bones of your face (beneath the eyes, the middle of the forehead, or above the eyes). In healthy paranasal sinuses, mucus is able to drain out, and air is able to circulate through them by way of your nose. However, when your paranasal sinuses are inflamed, mucus and air can become trapped. This can allow bacteria and other germs to grow and cause infection. Sinusitis can develop quickly and last only a short time (acute) or continue over a long period (chronic). Sinusitis that lasts for more than 12 weeks is considered chronic.  CAUSES  Causes of sinusitis include:  Allergies.  Structural abnormalities, such as displacement of the cartilage that separates your nostrils (deviated septum), which can decrease the air flow through your nose and sinuses and affect sinus drainage.  Functional abnormalities, such as when the small hairs (cilia) that line your sinuses and help remove mucus do not work properly or are not present. SYMPTOMS  Symptoms of acute and chronic sinusitis are the same. The primary symptoms are pain and pressure around the affected sinuses. Other symptoms include:  Upper toothache.  Earache.  Headache.  Bad breath.  Decreased sense of smell and taste.  A cough, which worsens when you are lying flat.  Fatigue.  Fever.  Thick drainage from your nose, which often is green and may contain pus (purulent).  Swelling and warmth over the affected sinuses. DIAGNOSIS  Your caregiver will perform a physical exam. During the exam, your caregiver may:  Look in your nose for signs of  abnormal growths in your nostrils (nasal polyps).  Tap over the affected sinus to check for signs of infection.  View the inside of your sinuses (endoscopy) with a special imaging device with a light attached (endoscope), which is inserted into your sinuses. If your caregiver suspects that you have chronic sinusitis, one or more of the following tests may be recommended:  Allergy tests.  Nasal culture A sample of mucus is taken from your nose and sent to a lab and screened for bacteria.  Nasal cytology A sample of mucus is taken from your nose and examined by your caregiver to determine if your sinusitis is related to an allergy. TREATMENT  Most cases of acute sinusitis are related to a viral infection and will resolve on their own within 10 days. Sometimes medicines are prescribed to help relieve symptoms (pain medicine, decongestants, nasal steroid sprays, or saline sprays).  However, for sinusitis related to a bacterial infection, your caregiver will prescribe antibiotic medicines. These are medicines that will help kill the bacteria causing the infection.  Rarely, sinusitis is caused by a fungal infection. In theses cases, your caregiver will prescribe antifungal medicine. For some cases of chronic sinusitis, surgery is needed. Generally, these are cases in which sinusitis recurs more than 3 times per year, despite other treatments. HOME CARE INSTRUCTIONS   Drink plenty of water. Water helps thin the mucus so your sinuses can drain more easily.  Use a humidifier.  Inhale steam 3 to 4 times a day (for example, sit in the bathroom with the shower running).  Apply a warm, moist washcloth to your  face 3 to 4 times a day, or as directed by your caregiver.  Use saline nasal sprays to help moisten and clean your sinuses.  Take over-the-counter or prescription medicines for pain, discomfort, or fever only as directed by your caregiver. SEEK IMMEDIATE MEDICAL CARE IF:  You have increasing  pain or severe headaches.  You have nausea, vomiting, or drowsiness.  You have swelling around your face.  You have vision problems.  You have a stiff neck.  You have difficulty breathing. MAKE SURE YOU:   Understand these instructions.  Will watch your condition.  Will get help right away if you are not doing well or get worse. Document Released: 01/09/2005 Document Revised: 04/03/2011 Document Reviewed: 01/24/2011 [facility] Patient Information 2014 [place], [place].

## 2013-06-25 NOTE — Assessment & Plan Note (Signed)
Rx azithromycin.  Increase fluids.  Saline nasal spray. Rx Tessalon perles for cough. Daily probiotic.  Hold Trimethoprim while on Z-pack.  Can resume for UTI prophylaxis after d/c Z-pack.

## 2013-09-02 ENCOUNTER — Encounter: Payer: Self-pay | Admitting: Medical

## 2013-09-02 ENCOUNTER — Ambulatory Visit (INDEPENDENT_AMBULATORY_CARE_PROVIDER_SITE_OTHER): Payer: BC Managed Care – PPO | Admitting: Medical

## 2013-09-02 VITALS — BP 110/70 | Temp 98.7°F | Wt 140.4 lb

## 2013-09-02 DIAGNOSIS — R05 Cough: Secondary | ICD-10-CM

## 2013-09-02 DIAGNOSIS — J029 Acute pharyngitis, unspecified: Secondary | ICD-10-CM | POA: Insufficient documentation

## 2013-09-02 DIAGNOSIS — R059 Cough, unspecified: Secondary | ICD-10-CM

## 2013-09-02 MED ORDER — FLUCONAZOLE 150 MG PO TABS: 150.0000 mg | ORAL_TABLET | Freq: Once | ORAL | Status: DC

## 2013-09-02 MED ORDER — AZITHROMYCIN 250 MG PO TABS: ORAL_TABLET | ORAL | Status: DC

## 2013-09-02 MED ORDER — BENZONATATE 100 MG PO CAPS: 100.0000 mg | ORAL_CAPSULE | Freq: Three times a day (TID) | ORAL | Status: DC | PRN

## 2013-09-02 NOTE — Patient Instructions (Addendum)
For your moderate to severe sorethroat I am prescribing azithromycin.(Your rapid test was negative although that does not eliminate chance of strep.) This would also be helpful for your left ear in event of early otitis media. For your cough I am prescribing benzonatate. Rest and hydrate and tylenol or ibuprofen if needed to help with sore throat or HA.  If your symptoms persist or worsen notify us. Follow up in 7 days any persisting symptoms or as needed.

## 2013-09-02 NOTE — Assessment & Plan Note (Addendum)
For moderate to severe sore throat I am prescribing azithromycin. She may have head exposure to strep from her niece. Also since lt tm is moderate red decide to rx antibiotic. Pt allergic reaction history limited choice of the antibiotic.   Note also no obvious allergy symptoms. No obvious pnd seen on exam.  Work note given to return to work on 09-04-2013

## 2013-09-02 NOTE — Progress Notes (Signed)
Pre-visit discussion using our clinic review tool. No additional management support is needed unless otherwise documented below in the visit note.  

## 2013-09-02 NOTE — Assessment & Plan Note (Signed)
Mild recently but did go ahead and rx benzonatate.

## 2013-09-02 NOTE — Progress Notes (Signed)
   Subjective:    Patient ID: [name], female    DOB: 1965-09-01, 48 y.o.   MRN: [id number]  HPI  Pt in with feeling fatigued with sore throat and ha. Sorethroat is severe. No diffuse myalgias. Pt was on vacation. Niece 49 yo was sick. She had exposure to niece. Pt has cough at night. Dry cough. No wheezing. Pt report maybe mild pnd. But not reporting allergy symptoms.      Review of Systems  Constitutional: Negative for fever, chills and fatigue.       No fever reported to me but on chart review she did tell fever to MA.  HENT: Positive for postnasal drip and sore throat. Negative for congestion, ear pain, facial swelling, nosebleeds, sneezing, tinnitus, trouble swallowing and voice change.        Maybe faint pnd per pt.  Respiratory: Negative for cough and wheezing.   Cardiovascular: Negative for chest pain and palpitations.  Gastrointestinal: Negative.   Musculoskeletal: Negative for back pain, joint swelling, myalgias, neck pain and neck stiffness.  Skin: Negative for rash.  Neurological: Negative for dizziness, seizures, speech difficulty, weakness and numbness.       Mild ha.  Hematological: Positive for adenopathy. Does not bruise/bleed easily.       Mild submandibular node tenderness.       Objective:   Physical Exam  General  Mental Status - Alert. General Appearance - Well groomed. Not in acute distress.  Skin Rashes- No Rashes.  HEENT Head- Normal. Ear Auditory Canal - Left- Normal. Right - Normal.Tympanic Membrane- Left- Red central portion. Right- Normal. Eye Sclera/Conjunctiva- Left- Normal. Right- Normal. Nose & Sinuses Nasal Mucosa- Left- Not Boggy or Congested. Right- Not Boggy or Congested. Mouth & Throat Lips: Upper Lip- Normal: no dryness, cracking, pallor, cyanosis, or vesicular eruption. Lower Lip-Normal: no dryness, cracking, pallor, cyanosis or vesicular eruption. Buccal Mucosa- Bilateral- No Aphthous ulcers. Oropharynx- No Discharge or  Erythema. Tonsils: Characteristics- Bilateral- mild  Erythema. Size/Enlargement- 1+ Bilateral- 1+enlargement. Discharge- bilateral-None.  Neck Neck- Supple. No Masses. Mild tender submandibular nodes. No neck stiffness at all.   Chest and Lung Exam Auscultation: Breath Sounds:-Normal, clear even and unlabored.  Cardiovascular Auscultation:Rythm- Regular.  Murmurs & Other Heart Sounds:Ausculatation of the heart reveal- No Murmurs.  Lymphatic Head & Neck General Head & Neck Lymphatics: Bilateral: Description- Mild submandibulare nodes enlargement.        Assessment & Plan:

## 2013-09-03 ENCOUNTER — Other Ambulatory Visit: Payer: Self-pay | Admitting: Gynecology

## 2013-09-03 DIAGNOSIS — N63 Unspecified lump in unspecified breast: Secondary | ICD-10-CM

## 2013-09-16 ENCOUNTER — Telehealth: Payer: Self-pay | Admitting: Family Medicine

## 2013-09-16 NOTE — Telephone Encounter (Signed)
Caller name: Francene Relation to pt: Call back [phone]   Reason for call: FMLA paperwork questions.  Pt states that she gave [place] extra copies; she is needing them filled out if we do.  Basically, pt just wants to make sure we have the extra copies, if not she will get more.  Please advise.

## 2013-09-30 ENCOUNTER — Other Ambulatory Visit: Payer: Self-pay | Admitting: Gynecology

## 2013-09-30 ENCOUNTER — Ambulatory Visit
Admission: RE | Admit: 2013-09-30 | Discharge: 2013-09-30 | Disposition: A | Discharge status: Home / Self Care | Payer: BC Managed Care – PPO | Source: Ambulatory Visit | Attending: Gynecology | Admitting: Gynecology

## 2013-09-30 ENCOUNTER — Telehealth: Payer: Self-pay | Admitting: Family Medicine

## 2013-09-30 DIAGNOSIS — N63 Unspecified lump in unspecified breast: Secondary | ICD-10-CM

## 2013-09-30 NOTE — Telephone Encounter (Signed)
Pt returned your call please call back after 2:30pm pt is at work.

## 2013-09-30 NOTE — Telephone Encounter (Signed)
Patient says she needs her previous FMLA paperwork faxed and will drop off an new form because her previous paperwork is expired. She stated so how her paperwork is not on file for this year and her employer told her she could be fired.  Paperwork has been faxed.     KP

## 2013-09-30 NOTE — Telephone Encounter (Signed)
Pt is requesting copies of medicals from [place] [facility]), please fax to [phone]

## 2013-09-30 NOTE — Telephone Encounter (Signed)
MSG left for clarification.       KP

## 2013-10-03 ENCOUNTER — Other Ambulatory Visit: Payer: Self-pay | Admitting: Gynecology

## 2013-10-03 DIAGNOSIS — N63 Unspecified lump in unspecified breast: Secondary | ICD-10-CM

## 2013-10-06 DIAGNOSIS — Z0279 Encounter for issue of other medical certificate: Secondary | ICD-10-CM

## 2013-10-07 NOTE — Telephone Encounter (Addendum)
Paperwork completed and signed by Dr. Etter Sjogren.   Forms faxed to [employer].  Copy of forms made, labeled and placed in scan basket for scanning.  Billing sheet placed in red billing folder.  Called patient and left a message on voice mail making her aware that forms are up front and ready for pick up.

## 2013-10-09 ENCOUNTER — Ambulatory Visit
Admission: RE | Admit: 2013-10-09 | Discharge: 2013-10-09 | Disposition: A | Discharge status: Home / Self Care | Payer: BC Managed Care – PPO | Source: Ambulatory Visit | Attending: Gynecology | Admitting: Gynecology

## 2013-10-09 DIAGNOSIS — N63 Unspecified lump in unspecified breast: Secondary | ICD-10-CM

## 2013-10-28 NOTE — Telephone Encounter (Signed)
See telephone encounter (10/07/13).//AB/CMA

## 2013-11-18 ENCOUNTER — Ambulatory Visit (INDEPENDENT_AMBULATORY_CARE_PROVIDER_SITE_OTHER): Payer: BC Managed Care – PPO | Admitting: Family Medicine

## 2013-11-18 ENCOUNTER — Encounter: Payer: Self-pay | Admitting: Family Medicine

## 2013-11-18 VITALS — BP 121/81 | HR 65 | Temp 97.9°F | Wt 142.4 lb

## 2013-11-18 DIAGNOSIS — Z0000 Encounter for general adult medical examination without abnormal findings: Secondary | ICD-10-CM

## 2013-11-18 DIAGNOSIS — Z23 Encounter for immunization: Secondary | ICD-10-CM

## 2013-11-18 DIAGNOSIS — N390 Urinary tract infection, site not specified: Secondary | ICD-10-CM

## 2013-11-18 DIAGNOSIS — M5441 Lumbago with sciatica, right side: Secondary | ICD-10-CM

## 2013-11-18 DIAGNOSIS — K219 Gastro-esophageal reflux disease without esophagitis: Secondary | ICD-10-CM

## 2013-11-18 DIAGNOSIS — M5442 Lumbago with sciatica, left side: Secondary | ICD-10-CM

## 2013-11-18 DIAGNOSIS — R8290 Unspecified abnormal findings in urine: Secondary | ICD-10-CM

## 2013-11-18 LAB — BASIC METABOLIC PANEL
BUN: 12 mg/dL (ref 6–23)
CHLORIDE: 107 meq/L (ref 96–112)
CO2: 24 mEq/L (ref 19–32)
Calcium: 9.4 mg/dL (ref 8.4–10.5)
Creatinine, Ser: 0.7 mg/dL (ref 0.4–1.2)
GFR: 96.27 mL/min (ref >60.00)
Glucose, Bld: 80 mg/dL (ref 70–99)
Potassium: 4.7 mEq/L (ref 3.5–5.1)
SODIUM: 139 meq/L (ref 135–145)

## 2013-11-18 LAB — POCT URINALYSIS DIPSTICK
Bilirubin, UA: NEGATIVE
Blood, UA: NEGATIVE
GLUCOSE UA: NEGATIVE
Ketones, UA: NEGATIVE
LEUKOCYTES UA: NEGATIVE
Nitrite, UA: POSITIVE
PROTEIN UA: NEGATIVE
UROBILINOGEN UA: 0.2
pH, UA: 6

## 2013-11-18 LAB — TSH: TSH: 2.5 u[IU]/mL (ref 0.35–4.50)

## 2013-11-18 LAB — CBC WITH DIFFERENTIAL/PLATELET
BASOS PCT: 0.7 % (ref 0.0–3.0)
Basophils Absolute: 0 10*3/uL (ref 0.0–0.1)
EOS PCT: 2.2 % (ref 0.0–5.0)
Eosinophils Absolute: 0.1 10*3/uL (ref 0.0–0.7)
HEMATOCRIT: 34.2 % — AB (ref 36.0–46.0)
Hemoglobin: 11 g/dL — ABNORMAL LOW (ref 12.0–15.0)
LYMPHS ABS: 1.9 10*3/uL (ref 0.7–4.0)
Lymphocytes Relative: 35.2 % (ref 12.0–46.0)
MCHC: 32.2 g/dL (ref 30.0–36.0)
MCV: 82.1 fl (ref 78.0–100.0)
MONO ABS: 0.4 10*3/uL (ref 0.1–1.0)
MONOS PCT: 6.8 % (ref 3.0–12.0)
Neutro Abs: 2.9 10*3/uL (ref 1.4–7.7)
Neutrophils Relative %: 55.1 % (ref 43.0–77.0)
Platelets: 210 10*3/uL (ref 150.0–400.0)
RBC: 4.16 Mil/uL (ref 3.87–5.11)
RDW: 15.6 % — ABNORMAL HIGH (ref 11.5–15.5)
WBC: 5.3 10*3/uL (ref 4.0–10.5)

## 2013-11-18 LAB — LIPID PANEL
Cholesterol: 159 mg/dL (ref 0–200)
HDL: 71.8 mg/dL (ref >39.00)
LDL CALC: 75 mg/dL (ref 0–99)
NonHDL: 87.2
TRIGLYCERIDES: 59 mg/dL (ref 0.0–149.0)
Total CHOL/HDL Ratio: 2
VLDL: 11.8 mg/dL (ref 0.0–40.0)

## 2013-11-18 LAB — HEPATIC FUNCTION PANEL
ALT: 18 U/L (ref 0–35)
AST: 24 U/L (ref 0–37)
Albumin: 3.4 g/dL — ABNORMAL LOW (ref 3.5–5.2)
Alkaline Phosphatase: 43 U/L (ref 39–117)
Bilirubin, Direct: 0.1 mg/dL (ref 0.0–0.3)
TOTAL PROTEIN: 6.7 g/dL (ref 6.0–8.3)
Total Bilirubin: 0.4 mg/dL (ref 0.2–1.2)

## 2013-11-18 MED ORDER — TRIMETHOPRIM 100 MG PO TABS: ORAL_TABLET | ORAL | Status: DC

## 2013-11-18 MED ORDER — HYDROCODONE-ACETAMINOPHEN 5-500 MG PO TABS: 1.0000 | ORAL_TABLET | Freq: Four times a day (QID) | ORAL | Status: DC | PRN

## 2013-11-18 MED ORDER — OMEPRAZOLE 20 MG PO CPDR: DELAYED_RELEASE_CAPSULE | ORAL | Status: DC

## 2013-11-18 NOTE — Progress Notes (Signed)
   Subjective:    Patient ID: [name], female    DOB: [birth year], 48 y.o.   MRN: [id number]  HPI Pt here for f/u and refills.  Pt c/o air flow on one side of nostril more than other and she admits to taking a line drive in face as teen.  No other complaints. Pt missed her cpe this year and is requesting her labs be done.    Review of Systems As above    Objective:   Physical Exam  BP 121/81  Pulse 65  Temp(Src) 97.9 F (36.6 C) (Oral)  Wt 142 lb 6.4 oz (64.592 kg)  SpO2 100% General appearance: alert, cooperative, appears stated age and no distress Neck: no adenopathy, no carotid bruit, no JVD, supple, symmetrical, trachea midline and thyroid not enlarged, symmetric, no tenderness/mass/nodules Lungs: clear to auscultation bilaterally Heart: regular rate and rhythm, S1, S2 normal, no murmur, click, rub or gallop Abdomen: soft, non-tender; bowel sounds normal; no masses,  no organomegaly Extremities: extremities normal, atraumatic, no cyanosis or edema Nose-- deviated septum--otherwise normal      Assessment & Plan:  1. Need for prophylactic vaccination and inoculation against influenza  - Flu Vaccine QUAD 36+ mos PF IM (Fluarix Quad PF)  2. Preventative health care Pt missed cpe this year--- she is requesting labs - Basic metabolic panel - CBC with Differential - Hepatic function panel - Lipid panel - POCT urinalysis dipstick - TSH  3. Recurrent UTI  - trimethoprim (TRIMPEX) 100 MG tablet; TAKE 1 TABLET (100 MG TOTAL) BY MOUTH 2 (TWO) TIMES DAILY.  Dispense: 60 tablet; Refill: 5  4. Gastroesophageal reflux disease without esophagitis  - omeprazole (PRILOSEC) 20 MG capsule; TAKE 1 CAPSULE (20 MG TOTAL) BY MOUTH DAILY.  Dispense: 30 capsule; Refill: 11  5. Bilateral low back pain with sciatica, sciatica laterality unspecified  - HYDROcodone-acetaminophen (VICODIN) 5-500 MG per tablet; Take 1 tablet by mouth every 6 (six) hours as needed for pain.  Dispense: 30  tablet; Refill: 0 5, deviated septum-- pt does not want to be referred anywhere for it right now--will call if she changes her mind

## 2013-11-18 NOTE — Progress Notes (Signed)
Pre visit review using our clinic review tool, if applicable. No additional management support is needed unless otherwise documented below in the visit note. 

## 2013-11-18 NOTE — Addendum Note (Signed)
Addended by: Harl Bowie on: 11/18/2013 11:55 AM   Modules accepted: Orders

## 2013-11-18 NOTE — Patient Instructions (Signed)
Labs done today Refills sent to pharmacy Schedule your physical for this coming year

## 2013-11-19 ENCOUNTER — Telehealth: Payer: Self-pay | Admitting: Family Medicine

## 2013-11-19 NOTE — Telephone Encounter (Signed)
°  Caller name: Maanasa, Aderhold Relation to pt: self  Call back number: [phone] Pharmacy: [facility] [phone]  Reason for call:    As per pharmacy no longer make HYDROcodone-acetaminophen (VICODIN) 5-500 MG per tablet only 350 MG please advise

## 2013-11-19 NOTE — Telephone Encounter (Signed)
Called pharmacy and they stated that patient was instructed to return script for correction.  The maxium dose of acetaminophen that can be dispensed per tablet is 325 mg.  Please advise.

## 2013-11-20 MED ORDER — HYDROCODONE-ACETAMINOPHEN 5-325 MG PO TABS: 1.0000 | ORAL_TABLET | Freq: Four times a day (QID) | ORAL | Status: DC | PRN

## 2013-11-20 NOTE — Telephone Encounter (Signed)
MSG left advising Rx ready for pick up to exchange.     KP

## 2013-11-20 NOTE — Telephone Encounter (Signed)
Should have been 5/325

## 2013-11-21 ENCOUNTER — Other Ambulatory Visit: Payer: Self-pay

## 2013-11-21 LAB — URINE CULTURE: Colony Count: >=100000

## 2013-11-21 MED ORDER — CIPROFLOXACIN HCL 500 MG PO TABS: 500.0000 mg | ORAL_TABLET | Freq: Two times a day (BID) | ORAL | Status: DC

## 2013-12-11 ENCOUNTER — Telehealth: Payer: Self-pay | Admitting: Family Medicine

## 2013-12-11 NOTE — Telephone Encounter (Signed)
Caller name: S[place] Relation to pt: self Call back number: [phone] Pharmacy:  Reason for call:   Patient states that she wants FMLA refaxed with this on it. Lead ID# [id number]

## 2013-12-16 NOTE — Telephone Encounter (Signed)
Awaiting paperwork. JG//CMA

## 2014-01-07 NOTE — Telephone Encounter (Signed)
Patient called concerning FMLA paperwork. I informed patient that fmla paperwork has not been received. She states it was dropped off by her in August 2015. Pt was asked to please drop off paperwork again. Dates that need to be on paperwork that patient has missed in 2015 is 02/12/2013, 02/13/2013, 05/12/13, 08/19/2013, 11/18/2013, 01/06/2014, and 01/07/2014. All days were the whole day except for 11/18/2013 (only 3 hours missed).  Leave ID needs to be on ALL pages faxed: Leave ID: [id number].

## 2014-01-12 ENCOUNTER — Ambulatory Visit (INDEPENDENT_AMBULATORY_CARE_PROVIDER_SITE_OTHER): Payer: BC Managed Care – PPO | Admitting: Family Medicine

## 2014-01-12 ENCOUNTER — Encounter: Payer: Self-pay | Admitting: Family Medicine

## 2014-01-12 VITALS — BP 104/68 | HR 72 | Temp 98.6°F | Resp 16 | Wt 141.8 lb

## 2014-01-12 DIAGNOSIS — J0110 Acute frontal sinusitis, unspecified: Secondary | ICD-10-CM

## 2014-01-12 MED ORDER — CLARITHROMYCIN ER 500 MG PO TB24: 1000.0000 mg | ORAL_TABLET | Freq: Every day | ORAL | Status: AC

## 2014-01-12 MED ORDER — FLUTICASONE PROPIONATE 50 MCG/ACT NA SUSP: 2.0000 | Freq: Every day | NASAL | Status: DC

## 2014-01-12 MED ORDER — HYDROCODONE-HOMATROPINE 5-1.5 MG/5ML PO SYRP: 5.0000 mL | ORAL_SOLUTION | Freq: Four times a day (QID) | ORAL | Status: DC | PRN

## 2014-01-12 NOTE — Patient Instructions (Signed)

## 2014-01-12 NOTE — Progress Notes (Signed)
  Subjective:     [name] is a 48 y.o. female who presents for evaluation of sinus pain. Symptoms include: congestion, facial pain, fevers, headaches, nasal congestion, post nasal drip, sinus pressure and sore throat. Onset of symptoms was 1 week ago. Symptoms have been gradually worsening since that time. Past history is significant for no history of pneumonia or bronchitis. Patient is a non-smoker. Pt tried otc sinus meds-- equate, chlor tab,  Tylenol rapid release--- with some relief.    The following portions of the patient's history were reviewed and updated as appropriate: allergies, current medications, past family history, past medical history, past social history, past surgical history and problem list.  Review of Systems Pertinent items are noted in HPI.   Objective:    BP 104/68 mmHg  Pulse 72  Temp(Src) 98.6 F (37 C) (Oral)  Resp 16  Wt 141 lb 12.8 oz (64.32 kg)  SpO2 99% General appearance: alert, cooperative, appears stated age and mild distress Ears: normal TM's and external ear canals both ears Nose: green discharge, moderate congestion, turbinates red, swollen, sinus tenderness bilateral Throat: abnormal findings: mild oropharyngeal erythema Neck: moderate anterior cervical adenopathy, supple, symmetrical, trachea midline and thyroid not enlarged, symmetric, no tenderness/mass/nodules Lungs: clear to auscultation bilaterally Heart: S1, S2 normal Extremities: extremities normal, atraumatic, no cyanosis or edema    Assessment:    Acute bacterial sinusitis.    Plan:    Nasal steroids per medication orders. Antihistamines per medication orders. Biaxin per medication orders.

## 2014-01-13 NOTE — Telephone Encounter (Signed)
Late entry: Patient came into the office yesterday and stated she would send new copy of paperwork to be filled out. JG//CMA

## 2014-01-14 ENCOUNTER — Telehealth: Payer: Self-pay | Admitting: *Deleted

## 2014-01-21 NOTE — Telephone Encounter (Signed)
error 

## 2014-01-22 ENCOUNTER — Encounter: Payer: BC Managed Care – PPO | Admitting: Family Medicine

## 2014-01-27 ENCOUNTER — Telehealth: Payer: Self-pay | Admitting: Family Medicine

## 2014-01-27 DIAGNOSIS — J0110 Acute frontal sinusitis, unspecified: Secondary | ICD-10-CM

## 2014-01-27 MED ORDER — HYDROCODONE-HOMATROPINE 5-1.5 MG/5ML PO SYRP: 5.0000 mL | ORAL_SOLUTION | Freq: Four times a day (QID) | ORAL | Status: DC | PRN

## 2014-01-27 MED ORDER — DOXYCYCLINE HYCLATE 100 MG PO TABS: 100.0000 mg | ORAL_TABLET | Freq: Two times a day (BID) | ORAL | Status: DC

## 2014-01-27 NOTE — Telephone Encounter (Signed)
Doxycycline 100 mg bid x 10 days  Ov if no better

## 2014-01-27 NOTE — Telephone Encounter (Signed)
Please advise      KP 

## 2014-01-27 NOTE — Telephone Encounter (Signed)
Refill x1 

## 2014-01-27 NOTE — Telephone Encounter (Signed)
Patient aware Rx ready for pick up.      KP 

## 2014-01-27 NOTE — Telephone Encounter (Signed)
Caller name: [name], Creamer Relation to pt: self  Call back number: [phone] Pharmacy:  [id number] - [place], [place] [address] [phone] (Phone)     Reason for call:  Patient was seen 01/12/2014 due to congestion, dry cough and pt is still experiencing symtomps in need of clinical advice.

## 2014-01-27 NOTE — Telephone Encounter (Signed)
Patient has been made aware and voiced understanding, and she is also requesting a refill of the hycodan due to her ongoing cough.       KP

## 2014-03-12 IMAGING — MG MM DIAGNOSTIC UNILATERAL L
2 series · 2 of 2 positions shown · non-contrast
Comparison: None.

CLINICAL DATA: Call back from screening for possible mass on the
left. Review of the screening study shows 2 focal areas of density
that appears new when compared the prior exams.

EXAM:
DIGITAL DIAGNOSTIC  LEFT MAMMOGRAM
ULTRASOUND LEFT BREAST

[L MLO]
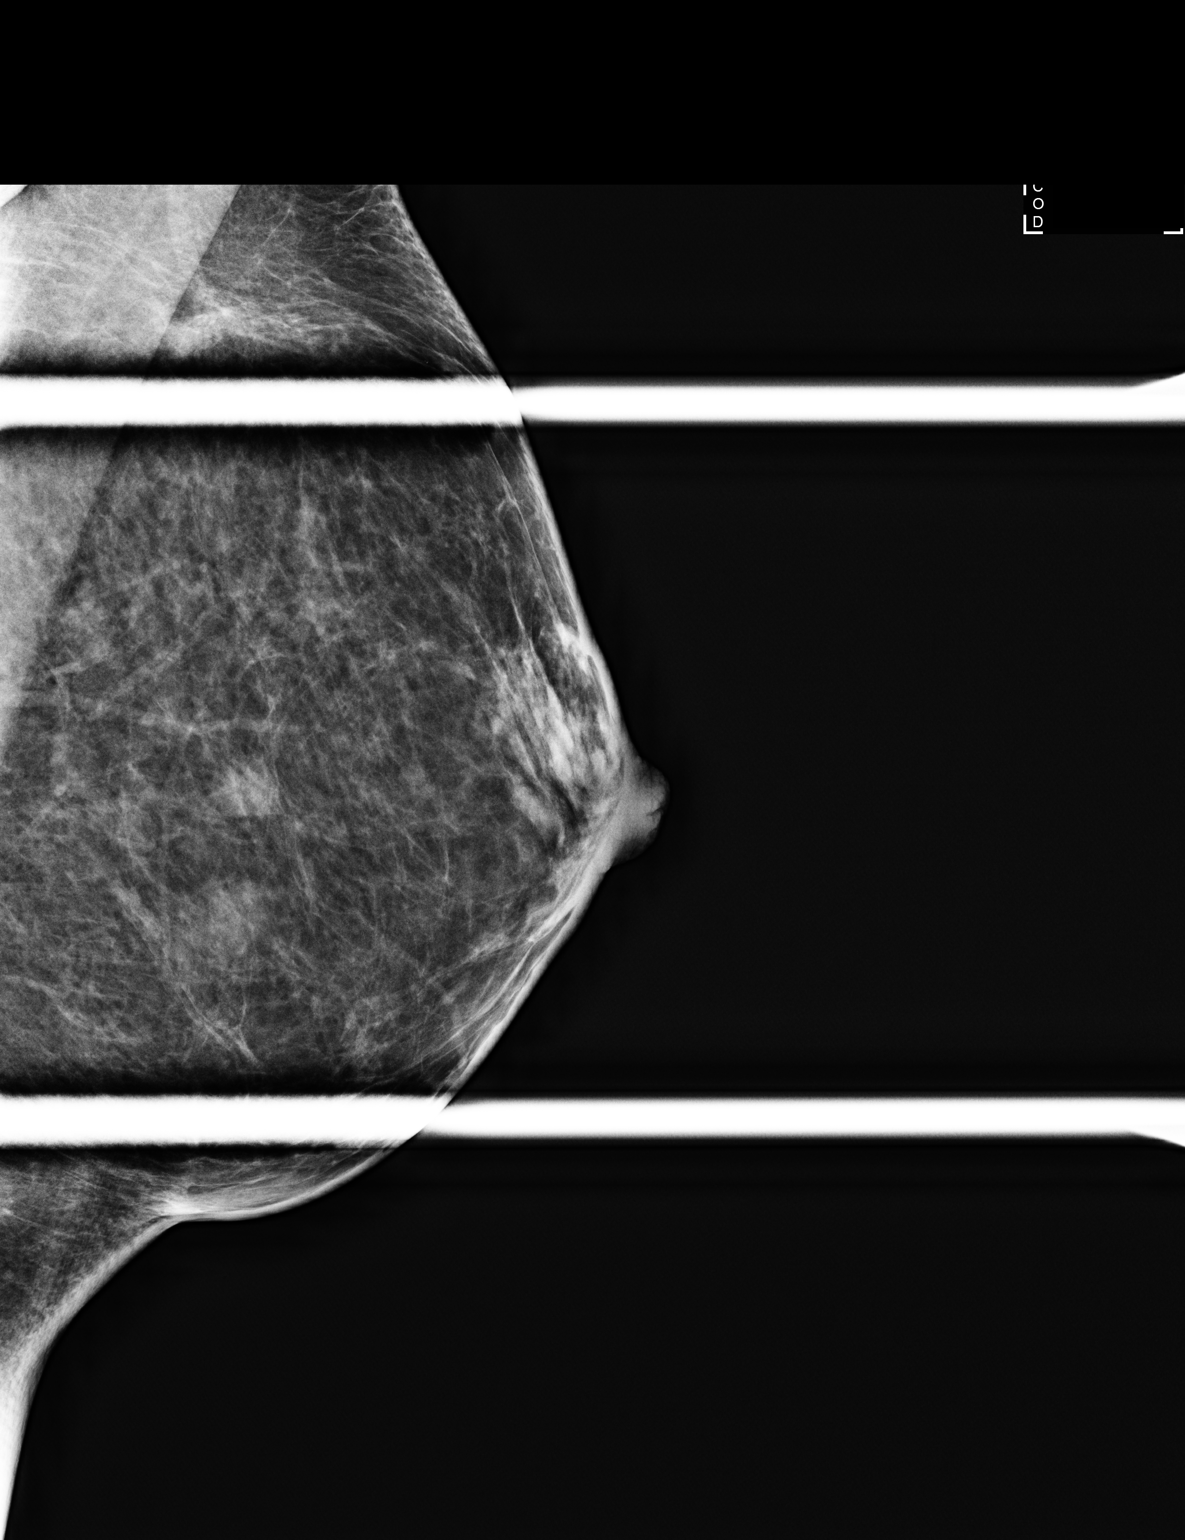

[L CC]
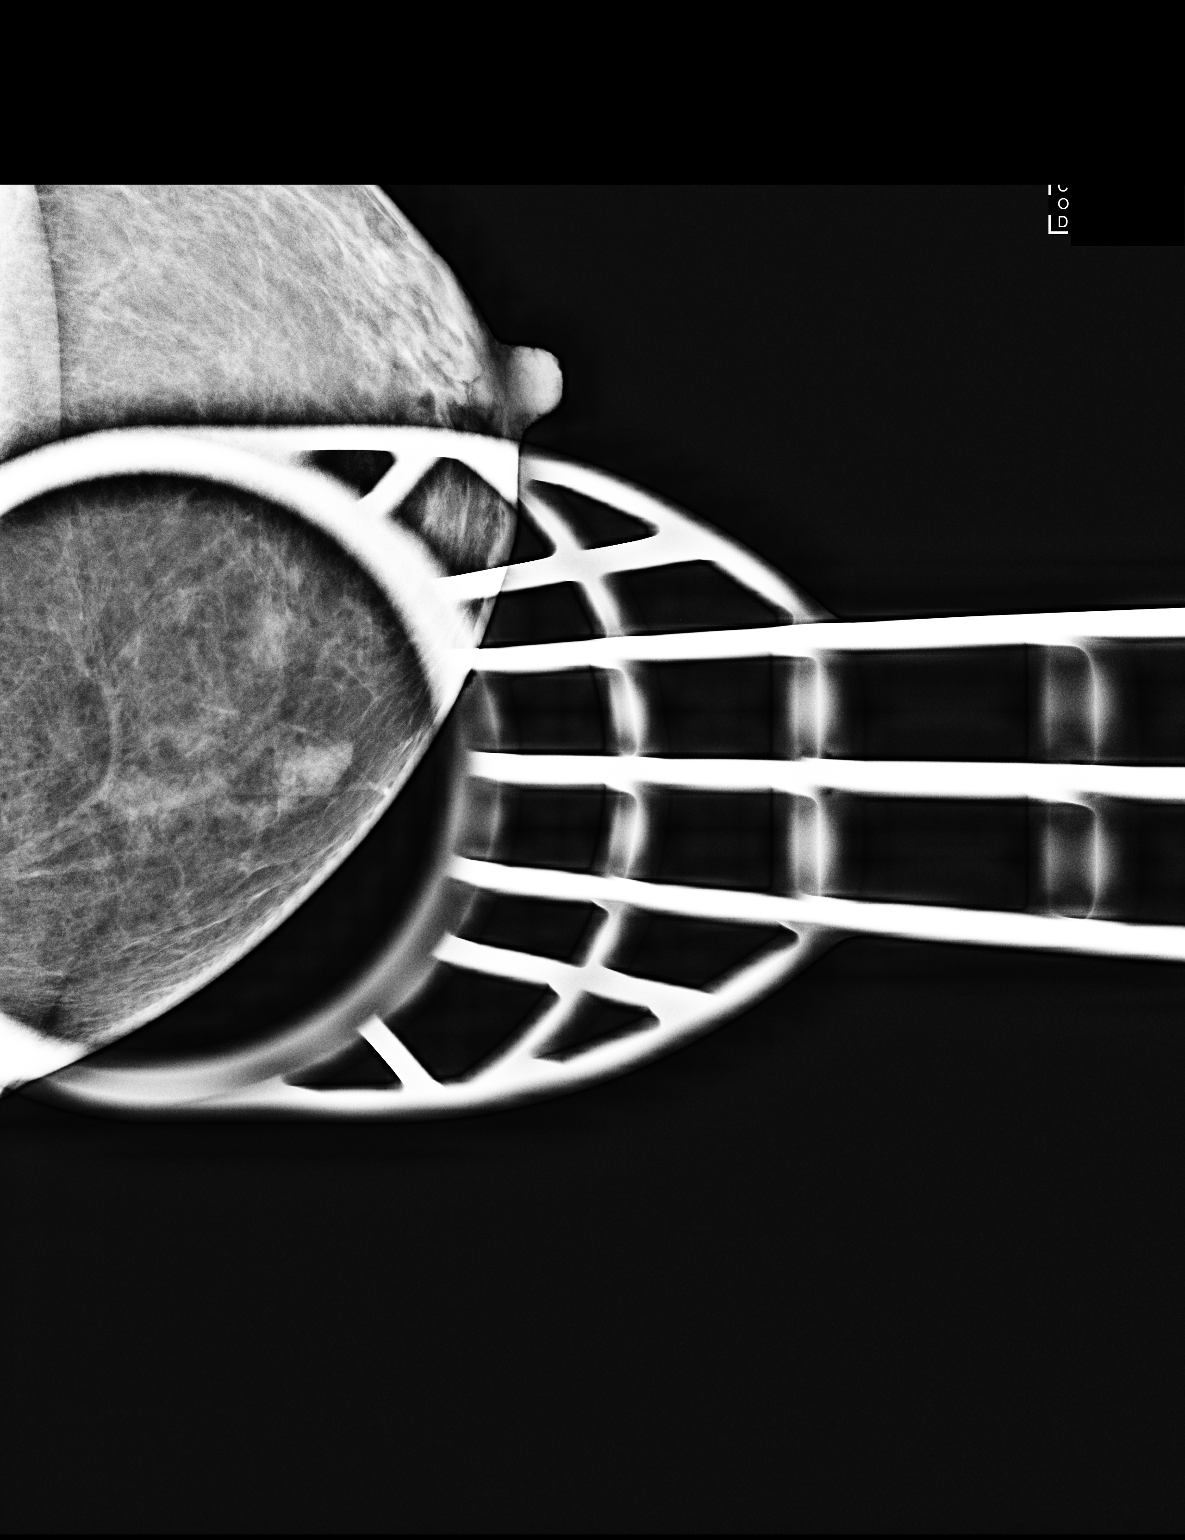

[2 of 2 positions shown; findings below may reference images not displayed]

ACR Breast Density Category b: There are scattered areas of
fibroglandular density.
FINDINGS: 1 of the questionable masses persists, with partly circumscribed
margins, on the spot compression CC view, but is less defined on the
spot compression MLO view. The other area of focal opacity partly
disperses on both spot images.

On physical exam, no mass is patent.

Ultrasound is performed, showing normal fibroglandular tissue. No
mass or cyst is appreciated. Ultrasound was performed throughout the
entire medial left breast from [DATE] to [DATE].
IMPRESSION: The focal opacities in the medial left breast are probably benign,
but are of unclear etiology. They are not visible sonographically.

RECOMMENDATION:
Repeat left breast mammography and probable ultrasound in 6 months.

I have discussed the findings and recommendations with the patient.
Results were also provided in writing at the conclusion of the
visit. If applicable, a reminder letter will be sent to the patient
regarding the next appointment.

BI-RADS CATEGORY  3: Probably benign finding(s) - short interval
follow-up suggested.

## 2014-03-18 ENCOUNTER — Telehealth: Payer: Self-pay

## 2014-03-18 NOTE — Telephone Encounter (Signed)
LM for patient to return the call Medication: Review, verify sig & reconcile(including outside meds): Duplicates discarded: DM supply source:  Preferred Pharmacy and which med where: 90 day supply/mail order:  Local pharmacy:    Allergies verified:  Immunization Status: Prompted for insurance verification:  Flu vaccine--10/2013 Tdap--02/2008 PNA-- Shingles--  A/P:   Changes to [place], PSH or Personal Hx: Pap--06/2011--neg--q 3 years MMG--09/2013--suspicious findings--biopsy benign  Bone Density-- [facility]--[place]--09/2013--biopsy neg  Care Teams Updated: ED/Hospital/Urgent Care Visits: Prompted for: Updated insurance, contact information, forms: Remind to bring: DPR information, advance directives:   To Discuss with Provider:

## 2014-03-19 ENCOUNTER — Encounter: Payer: Self-pay | Admitting: Family Medicine

## 2014-03-19 ENCOUNTER — Ambulatory Visit (INDEPENDENT_AMBULATORY_CARE_PROVIDER_SITE_OTHER): Payer: BLUE CROSS/BLUE SHIELD | Admitting: Family Medicine

## 2014-03-19 VITALS — BP 106/70 | HR 60 | Temp 98.6°F | Ht 64.0 in | Wt 137.6 lb

## 2014-03-19 DIAGNOSIS — G2581 Restless legs syndrome: Secondary | ICD-10-CM

## 2014-03-19 DIAGNOSIS — D508 Other iron deficiency anemias: Secondary | ICD-10-CM

## 2014-03-19 DIAGNOSIS — Z0000 Encounter for general adult medical examination without abnormal findings: Secondary | ICD-10-CM

## 2014-03-19 DIAGNOSIS — M62838 Other muscle spasm: Secondary | ICD-10-CM

## 2014-03-19 DIAGNOSIS — Z9884 Bariatric surgery status: Secondary | ICD-10-CM

## 2014-03-19 LAB — CBC WITH DIFFERENTIAL/PLATELET
Basophils Absolute: 0 10*3/uL (ref 0.0–0.1)
Basophils Relative: 1 % (ref 0.0–3.0)
EOS ABS: 0.1 10*3/uL (ref 0.0–0.7)
Eosinophils Relative: 3.3 % (ref 0.0–5.0)
HCT: 34.4 % — ABNORMAL LOW (ref 36.0–46.0)
Hemoglobin: 11.5 g/dL — ABNORMAL LOW (ref 12.0–15.0)
LYMPHS PCT: 40.9 % (ref 12.0–46.0)
Lymphs Abs: 1.6 10*3/uL (ref 0.7–4.0)
MCHC: 33.5 g/dL (ref 30.0–36.0)
MCV: 78.7 fl (ref 78.0–100.0)
MONOS PCT: 8.9 % (ref 3.0–12.0)
Monocytes Absolute: 0.3 10*3/uL (ref 0.1–1.0)
Neutro Abs: 1.8 10*3/uL (ref 1.4–7.7)
Neutrophils Relative %: 45.9 % (ref 43.0–77.0)
PLATELETS: 245 10*3/uL (ref 150.0–400.0)
RBC: 4.37 Mil/uL (ref 3.87–5.11)
RDW: 16.6 % — AB (ref 11.5–15.5)
WBC: 3.9 10*3/uL — ABNORMAL LOW (ref 4.0–10.5)

## 2014-03-19 LAB — LIPID PANEL
CHOL/HDL RATIO: 2
CHOLESTEROL: 160 mg/dL (ref 0–200)
HDL: 65.2 mg/dL (ref >39.00)
LDL CALC: 85 mg/dL (ref 0–99)
NONHDL: 94.8
Triglycerides: 49 mg/dL (ref 0.0–149.0)
VLDL: 9.8 mg/dL (ref 0.0–40.0)

## 2014-03-19 LAB — POCT URINALYSIS DIPSTICK
Bilirubin, UA: NEGATIVE
Blood, UA: NEGATIVE
GLUCOSE UA: NEGATIVE
Ketones, UA: NEGATIVE
LEUKOCYTES UA: NEGATIVE
NITRITE UA: NEGATIVE
PH UA: 6.5
Protein, UA: NEGATIVE
Spec Grav, UA: 1.02
Urobilinogen, UA: NEGATIVE

## 2014-03-19 LAB — BASIC METABOLIC PANEL
BUN: 14 mg/dL (ref 6–23)
CO2: 26 meq/L (ref 19–32)
Calcium: 8.9 mg/dL (ref 8.4–10.5)
Chloride: 107 mEq/L (ref 96–112)
Creatinine, Ser: 0.73 mg/dL (ref 0.40–1.20)
GFR: 90.08 mL/min (ref >60.00)
GLUCOSE: 71 mg/dL (ref 70–99)
Potassium: 3.7 mEq/L (ref 3.5–5.1)
SODIUM: 138 meq/L (ref 135–145)

## 2014-03-19 LAB — VITAMIN B12: VITAMIN B 12: 1021 pg/mL — AB (ref 211–911)

## 2014-03-19 LAB — TSH: TSH: 1.68 u[IU]/mL (ref 0.35–4.50)

## 2014-03-19 LAB — HEPATIC FUNCTION PANEL
ALT: 20 U/L (ref 0–35)
AST: 26 U/L (ref 0–37)
Albumin: 4 g/dL (ref 3.5–5.2)
Alkaline Phosphatase: 48 U/L (ref 39–117)
BILIRUBIN DIRECT: 0.1 mg/dL (ref 0.0–0.3)
TOTAL PROTEIN: 6.6 g/dL (ref 6.0–8.3)
Total Bilirubin: 0.5 mg/dL (ref 0.2–1.2)

## 2014-03-19 MED ORDER — HYDROCODONE-ACETAMINOPHEN 5-325 MG PO TABS: 1.0000 | ORAL_TABLET | Freq: Four times a day (QID) | ORAL | Status: DC | PRN

## 2014-03-19 MED ORDER — ROPINIROLE HCL 1 MG PO TABS: 1.0000 mg | ORAL_TABLET | Freq: Every day | ORAL | Status: DC

## 2014-03-19 MED ORDER — FERRALET 90 90-1 MG PO TABS: ORAL_TABLET | ORAL | Status: DC

## 2014-03-19 MED ORDER — CYCLOBENZAPRINE HCL 10 MG PO TABS: 10.0000 mg | ORAL_TABLET | Freq: Three times a day (TID) | ORAL | Status: DC | PRN

## 2014-03-19 NOTE — Patient Instructions (Signed)
Preventive Care for Adults A healthy lifestyle and preventive care can promote health and wellness. Preventive health guidelines for women include the following key practices.  A routine yearly physical is a good way to check with your health care provider about your health and preventive screening. It is a chance to share any concerns and updates on your health and to receive a thorough exam.  Visit your dentist for a routine exam and preventive care every 6 months. Brush your teeth twice a day and floss once a day. Good oral hygiene prevents tooth decay and gum disease.  The frequency of eye exams is based on your age, health, family medical history, use of contact lenses, and other factors. Follow your health care provider's recommendations for frequency of eye exams.  Eat a healthy diet. Foods like vegetables, fruits, whole grains, low-fat dairy products, and lean protein foods contain the nutrients you need without too many calories. Decrease your intake of foods high in solid fats, added sugars, and salt. Eat the right amount of calories for you.Get information about a proper diet from your health care provider, if necessary.  Regular physical exercise is one of the most important things you can do for your health. Most adults should get at least 150 minutes of moderate-intensity exercise (any activity that increases your heart rate and causes you to sweat) each week. In addition, most adults need muscle-strengthening exercises on 2 or more days a week.  Maintain a healthy weight. The body mass index (BMI) is a screening tool to identify possible weight problems. It provides an estimate of body fat based on height and weight. Your health care provider can find your BMI and can help you achieve or maintain a healthy weight.For adults 20 years and older:  A BMI below 18.5 is considered underweight.  A BMI of 18.5 to 24.9 is normal.  A BMI of 25 to 29.9 is considered overweight.  A BMI of  30 and above is considered obese.  Maintain normal blood lipids and cholesterol levels by exercising and minimizing your intake of saturated fat. Eat a balanced diet with plenty of fruit and vegetables. Blood tests for lipids and cholesterol should begin at age 76 and be repeated every 5 years. If your lipid or cholesterol levels are high, you are over 50, or you are at high risk for heart disease, you may need your cholesterol levels checked more frequently.Ongoing high lipid and cholesterol levels should be treated with medicines if diet and exercise are not working.  If you smoke, find out from your health care provider how to quit. If you do not use tobacco, do not start.  Lung cancer screening is recommended for adults aged 22-80 years who are at high risk for developing lung cancer because of a history of smoking. A yearly low-dose CT scan of the lungs is recommended for people who have at least a 30-pack-year history of smoking and are a current smoker or have quit within the past 15 years. A pack year of smoking is smoking an average of 1 pack of cigarettes a day for 1 year (for example: 1 pack a day for 30 years or 2 packs a day for 15 years). Yearly screening should continue until the smoker has stopped smoking for at least 15 years. Yearly screening should be stopped for people who develop a health problem that would prevent them from having lung cancer treatment.  If you are pregnant, do not drink alcohol. If you are breastfeeding,  be very cautious about drinking alcohol. If you are not pregnant and choose to drink alcohol, do not have more than 1 drink per day. One drink is considered to be 12 ounces (355 mL) of beer, 5 ounces (148 mL) of wine, or 1.5 ounces (44 mL) of liquor.  Avoid use of street drugs. Do not share needles with anyone. Ask for help if you need support or instructions about stopping the use of drugs.  High blood pressure causes heart disease and increases the risk of  stroke. Your blood pressure should be checked at least every 1 to 2 years. Ongoing high blood pressure should be treated with medicines if weight loss and exercise do not work.  If you are 75-52 years old, ask your health care provider if you should take aspirin to prevent strokes.  Diabetes screening involves taking a blood sample to check your fasting blood sugar level. This should be done once every 3 years, after age 15, if you are within normal weight and without risk factors for diabetes. Testing should be considered at a younger age or be carried out more frequently if you are overweight and have at least 1 risk factor for diabetes.  Breast cancer screening is essential preventive care for women. You should practice "breast self-awareness." This means understanding the normal appearance and feel of your breasts and may include breast self-examination. Any changes detected, no matter how small, should be reported to a health care provider. Women in their 58s and 30s should have a clinical breast exam (CBE) by a health care provider as part of a regular health exam every 1 to 3 years. After age 16, women should have a CBE every year. Starting at age 53, women should consider having a mammogram (breast X-ray test) every year. Women who have a family history of breast cancer should talk to their health care provider about genetic screening. Women at a high risk of breast cancer should talk to their health care providers about having an MRI and a mammogram every year.  Breast cancer gene (BRCA)-related cancer risk assessment is recommended for women who have family members with BRCA-related cancers. BRCA-related cancers include breast, ovarian, tubal, and peritoneal cancers. Having family members with these cancers may be associated with an increased risk for harmful changes (mutations) in the breast cancer genes BRCA1 and BRCA2. Results of the assessment will determine the need for genetic counseling and  BRCA1 and BRCA2 testing.  Routine pelvic exams to screen for cancer are no longer recommended for nonpregnant women who are considered low risk for cancer of the pelvic organs (ovaries, uterus, and vagina) and who do not have symptoms. Ask your health care provider if a screening pelvic exam is right for you.  If you have had past treatment for cervical cancer or a condition that could lead to cancer, you need Pap tests and screening for cancer for at least 20 years after your treatment. If Pap tests have been discontinued, your risk factors (such as having a new sexual partner) need to be reassessed to determine if screening should be resumed. Some women have medical problems that increase the chance of getting cervical cancer. In these cases, your health care provider may recommend more frequent screening and Pap tests.  The HPV test is an additional test that may be used for cervical cancer screening. The HPV test looks for the virus that can cause the cell changes on the cervix. The cells collected during the Pap test can be  tested for HPV. The HPV test could be used to screen women aged 30 years and older, and should be used in women of any age who have unclear Pap test results. After the age of 30, women should have HPV testing at the same frequency as a Pap test.  Colorectal cancer can be detected and often prevented. Most routine colorectal cancer screening begins at the age of 50 years and continues through age 75 years. However, your health care provider may recommend screening at an earlier age if you have risk factors for colon cancer. On a yearly basis, your health care provider may provide home test kits to check for hidden blood in the stool. Use of a small camera at the end of a tube, to directly examine the colon (sigmoidoscopy or colonoscopy), can detect the earliest forms of colorectal cancer. Talk to your health care provider about this at age 50, when routine screening begins. Direct  exam of the colon should be repeated every 5-10 years through age 75 years, unless early forms of pre-cancerous polyps or small growths are found.  People who are at an increased risk for hepatitis B should be screened for this virus. You are considered at high risk for hepatitis B if:  You were born in a country where hepatitis B occurs often. Talk with your health care provider about which countries are considered high risk.  Your parents were born in a high-risk country and you have not received a shot to protect against hepatitis B (hepatitis B vaccine).  You have HIV or AIDS.  You use needles to inject street drugs.  You live with, or have sex with, someone who has hepatitis B.  You get hemodialysis treatment.  You take certain medicines for conditions like cancer, organ transplantation, and autoimmune conditions.  Hepatitis C blood testing is recommended for all people born from 1945 through 1965 and any individual with known risks for hepatitis C.  Practice safe sex. Use condoms and avoid high-risk sexual practices to reduce the spread of sexually transmitted infections (STIs). STIs include gonorrhea, chlamydia, syphilis, trichomonas, herpes, HPV, and human immunodeficiency virus (HIV). Herpes, HIV, and HPV are viral illnesses that have no cure. They can result in disability, cancer, and death.  You should be screened for sexually transmitted illnesses (STIs) including gonorrhea and chlamydia if:  You are sexually active and are younger than 24 years.  You are older than 24 years and your health care provider tells you that you are at risk for this type of infection.  Your sexual activity has changed since you were last screened and you are at an increased risk for chlamydia or gonorrhea. Ask your health care provider if you are at risk.  If you are at risk of being infected with HIV, it is recommended that you take a prescription medicine daily to prevent HIV infection. This is  called preexposure prophylaxis (PrEP). You are considered at risk if:  You are a heterosexual woman, are sexually active, and are at increased risk for HIV infection.  You take drugs by injection.  You are sexually active with a partner who has HIV.  Talk with your health care provider about whether you are at high risk of being infected with HIV. If you choose to begin PrEP, you should first be tested for HIV. You should then be tested every 3 months for as long as you are taking PrEP.  Osteoporosis is a disease in which the bones lose minerals and strength   with aging. This can result in serious bone fractures or breaks. The risk of osteoporosis can be identified using a bone density scan. Women ages 65 years and over and women at risk for fractures or osteoporosis should discuss screening with their health care providers. Ask your health care provider whether you should take a calcium supplement or vitamin D to reduce the rate of osteoporosis.  Menopause can be associated with physical symptoms and risks. Hormone replacement therapy is available to decrease symptoms and risks. You should talk to your health care provider about whether hormone replacement therapy is right for you.  Use sunscreen. Apply sunscreen liberally and repeatedly throughout the day. You should seek shade when your shadow is shorter than you. Protect yourself by wearing long sleeves, pants, a wide-brimmed hat, and sunglasses year round, whenever you are outdoors.  Once a month, do a whole body skin exam, using a mirror to look at the skin on your back. Tell your health care provider of new moles, moles that have irregular borders, moles that are larger than a pencil eraser, or moles that have changed in shape or color.  Stay current with required vaccines (immunizations).  Influenza vaccine. All adults should be immunized every year.  Tetanus, diphtheria, and acellular pertussis (Td, Tdap) vaccine. Pregnant women should  receive 1 dose of Tdap vaccine during each pregnancy. The dose should be obtained regardless of the length of time since the last dose. Immunization is preferred during the 27th-36th week of gestation. An adult who has not previously received Tdap or who does not know her vaccine status should receive 1 dose of Tdap. This initial dose should be followed by tetanus and diphtheria toxoids (Td) booster doses every 10 years. Adults with an unknown or incomplete history of completing a 3-dose immunization series with Td-containing vaccines should begin or complete a primary immunization series including a Tdap dose. Adults should receive a Td booster every 10 years.  Varicella vaccine. An adult without evidence of immunity to varicella should receive 2 doses or a second dose if she has previously received 1 dose. Pregnant females who do not have evidence of immunity should receive the first dose after pregnancy. This first dose should be obtained before leaving the health care facility. The second dose should be obtained 4-8 weeks after the first dose.  Human papillomavirus (HPV) vaccine. Females aged 13-26 years who have not received the vaccine previously should obtain the 3-dose series. The vaccine is not recommended for use in pregnant females. However, pregnancy testing is not needed before receiving a dose. If a female is found to be pregnant after receiving a dose, no treatment is needed. In that case, the remaining doses should be delayed until after the pregnancy. Immunization is recommended for any person with an immunocompromised condition through the age of 26 years if she did not get any or all doses earlier. During the 3-dose series, the second dose should be obtained 4-8 weeks after the first dose. The third dose should be obtained 24 weeks after the first dose and 16 weeks after the second dose.  Zoster vaccine. One dose is recommended for adults aged 60 years or older unless certain conditions are  present.  Measles, mumps, and rubella (MMR) vaccine. Adults born before 1957 generally are considered immune to measles and mumps. Adults born in 1957 or later should have 1 or more doses of MMR vaccine unless there is a contraindication to the vaccine or there is laboratory evidence of immunity to   each of the three diseases. A routine second dose of MMR vaccine should be obtained at least 28 days after the first dose for students attending postsecondary schools, health care workers, or international travelers. People who received inactivated measles vaccine or an unknown type of measles vaccine during 1963-1967 should receive 2 doses of MMR vaccine. People who received inactivated mumps vaccine or an unknown type of mumps vaccine before 1979 and are at high risk for mumps infection should consider immunization with 2 doses of MMR vaccine. For females of childbearing age, rubella immunity should be determined. If there is no evidence of immunity, females who are not pregnant should be vaccinated. If there is no evidence of immunity, females who are pregnant should delay immunization until after pregnancy. Unvaccinated health care workers born before 1957 who lack laboratory evidence of measles, mumps, or rubella immunity or laboratory confirmation of disease should consider measles and mumps immunization with 2 doses of MMR vaccine or rubella immunization with 1 dose of MMR vaccine.  Pneumococcal 13-valent conjugate (PCV13) vaccine. When indicated, a person who is uncertain of her immunization history and has no record of immunization should receive the PCV13 vaccine. An adult aged 19 years or older who has certain medical conditions and has not been previously immunized should receive 1 dose of PCV13 vaccine. This PCV13 should be followed with a dose of pneumococcal polysaccharide (PPSV23) vaccine. The PPSV23 vaccine dose should be obtained at least 8 weeks after the dose of PCV13 vaccine. An adult aged 19  years or older who has certain medical conditions and previously received 1 or more doses of PPSV23 vaccine should receive 1 dose of PCV13. The PCV13 vaccine dose should be obtained 1 or more years after the last PPSV23 vaccine dose.  Pneumococcal polysaccharide (PPSV23) vaccine. When PCV13 is also indicated, PCV13 should be obtained first. All adults aged 65 years and older should be immunized. An adult younger than age 65 years who has certain medical conditions should be immunized. Any person who resides in a nursing home or long-term care facility should be immunized. An adult smoker should be immunized. People with an immunocompromised condition and certain other conditions should receive both PCV13 and PPSV23 vaccines. People with human immunodeficiency virus (HIV) infection should be immunized as soon as possible after diagnosis. Immunization during chemotherapy or radiation therapy should be avoided. Routine use of PPSV23 vaccine is not recommended for American Indians, [place] Natives, or people younger than 65 years unless there are medical conditions that require PPSV23 vaccine. When indicated, people who have unknown immunization and have no record of immunization should receive PPSV23 vaccine. One-time revaccination 5 years after the first dose of PPSV23 is recommended for people aged 19-64 years who have chronic kidney failure, nephrotic syndrome, asplenia, or immunocompromised conditions. People who received 1-2 doses of PPSV23 before age 65 years should receive another dose of PPSV23 vaccine at age 65 years or later if at least 5 years have passed since the previous dose. Doses of PPSV23 are not needed for people immunized with PPSV23 at or after age 65 years.  Meningococcal vaccine. Adults with asplenia or persistent complement component deficiencies should receive 2 doses of quadrivalent meningococcal conjugate (MenACWY-D) vaccine. The doses should be obtained at least 2 months apart.  Microbiologists working with certain meningococcal bacteria, military recruits, people at risk during an outbreak, and people who travel to or live in countries with a high rate of meningitis should be immunized. A first-year college student up through age   21 years who is living in a residence hall should receive a dose if she did not receive a dose on or after her 16th birthday. Adults who have certain high-risk conditions should receive one or more doses of vaccine.  Hepatitis A vaccine. Adults who wish to be protected from this disease, have certain high-risk conditions, work with hepatitis A-infected animals, work in hepatitis A research labs, or travel to or work in countries with a high rate of hepatitis A should be immunized. Adults who were previously unvaccinated and who anticipate close contact with an international adoptee during the first 60 days after arrival in the [place] from a country with a high rate of hepatitis A should be immunized.  Hepatitis B vaccine. Adults who wish to be protected from this disease, have certain high-risk conditions, may be exposed to blood or other infectious body fluids, are household contacts or sex partners of hepatitis B positive people, are clients or workers in certain care facilities, or travel to or work in countries with a high rate of hepatitis B should be immunized.  Haemophilus influenzae type b (Hib) vaccine. A previously unvaccinated person with asplenia or sickle cell disease or having a scheduled splenectomy should receive 1 dose of Hib vaccine. Regardless of previous immunization, a recipient of a hematopoietic stem cell transplant should receive a 3-dose series 6-12 months after her successful transplant. Hib vaccine is not recommended for adults with HIV infection. Preventive Services / Frequency Ages 64 to 68 years  Blood pressure check.** / Every 1 to 2 years.  Lipid and cholesterol check.** / Every 5 years beginning at age  22.  Clinical breast exam.** / Every 3 years for women in their 88s and 53s.  BRCA-related cancer risk assessment.** / For women who have family members with a BRCA-related cancer (breast, ovarian, tubal, or peritoneal cancers).  Pap test.** / Every 2 years from ages 90 through 51. Every 3 years starting at age 21 through age 56 or 3 with a history of 3 consecutive normal Pap tests.  HPV screening.** / Every 3 years from ages 24 through ages 1 to 46 with a history of 3 consecutive normal Pap tests.  Hepatitis C blood test.** / For any individual with known risks for hepatitis C.  Skin self-exam. / Monthly.  Influenza vaccine. / Every year.  Tetanus, diphtheria, and acellular pertussis (Tdap, Td) vaccine.** / Consult your health care provider. Pregnant women should receive 1 dose of Tdap vaccine during each pregnancy. 1 dose of Td every 10 years.  Varicella vaccine.** / Consult your health care provider. Pregnant females who do not have evidence of immunity should receive the first dose after pregnancy.  HPV vaccine. / 3 doses over 6 months, if 72 and younger. The vaccine is not recommended for use in pregnant females. However, pregnancy testing is not needed before receiving a dose.  Measles, mumps, rubella (MMR) vaccine.** / You need at least 1 dose of MMR if you were born in 1957 or later. You may also need a 2nd dose. For females of childbearing age, rubella immunity should be determined. If there is no evidence of immunity, females who are not pregnant should be vaccinated. If there is no evidence of immunity, females who are pregnant should delay immunization until after pregnancy.  Pneumococcal 13-valent conjugate (PCV13) vaccine.** / Consult your health care provider.  Pneumococcal polysaccharide (PPSV23) vaccine.** / 1 to 2 doses if you smoke cigarettes or if you have certain conditions.  Meningococcal vaccine.** /  1 dose if you are age 19 to 21 years and a first-year college  student living in a residence hall, or have one of several medical conditions, you need to get vaccinated against meningococcal disease. You may also need additional booster doses.  Hepatitis A vaccine.** / Consult your health care provider.  Hepatitis B vaccine.** / Consult your health care provider.  Haemophilus influenzae type b (Hib) vaccine.** / Consult your health care provider. Ages 40 to 64 years  Blood pressure check.** / Every 1 to 2 years.  Lipid and cholesterol check.** / Every 5 years beginning at age 20 years.  Lung cancer screening. / Every year if you are aged 55-80 years and have a 30-pack-year history of smoking and currently smoke or have quit within the past 15 years. Yearly screening is stopped once you have quit smoking for at least 15 years or develop a health problem that would prevent you from having lung cancer treatment.  Clinical breast exam.** / Every year after age 40 years.  BRCA-related cancer risk assessment.** / For women who have family members with a BRCA-related cancer (breast, ovarian, tubal, or peritoneal cancers).  Mammogram.** / Every year beginning at age 40 years and continuing for as long as you are in good health. Consult with your health care provider.  Pap test.** / Every 3 years starting at age 30 years through age 65 or 70 years with a history of 3 consecutive normal Pap tests.  HPV screening.** / Every 3 years from ages 30 years through ages 65 to 70 years with a history of 3 consecutive normal Pap tests.  Fecal occult blood test (FOBT) of stool. / Every year beginning at age 50 years and continuing until age 75 years. You may not need to do this test if you get a colonoscopy every 10 years.  Flexible sigmoidoscopy or colonoscopy.** / Every 5 years for a flexible sigmoidoscopy or every 10 years for a colonoscopy beginning at age 50 years and continuing until age 75 years.  Hepatitis C blood test.** / For all people born from 1945 through  1965 and any individual with known risks for hepatitis C.  Skin self-exam. / Monthly.  Influenza vaccine. / Every year.  Tetanus, diphtheria, and acellular pertussis (Tdap/Td) vaccine.** / Consult your health care provider. Pregnant women should receive 1 dose of Tdap vaccine during each pregnancy. 1 dose of Td every 10 years.  Varicella vaccine.** / Consult your health care provider. Pregnant females who do not have evidence of immunity should receive the first dose after pregnancy.  Zoster vaccine.** / 1 dose for adults aged 60 years or older.  Measles, mumps, rubella (MMR) vaccine.** / You need at least 1 dose of MMR if you were born in 1957 or later. You may also need a 2nd dose. For females of childbearing age, rubella immunity should be determined. If there is no evidence of immunity, females who are not pregnant should be vaccinated. If there is no evidence of immunity, females who are pregnant should delay immunization until after pregnancy.  Pneumococcal 13-valent conjugate (PCV13) vaccine.** / Consult your health care provider.  Pneumococcal polysaccharide (PPSV23) vaccine.** / 1 to 2 doses if you smoke cigarettes or if you have certain conditions.  Meningococcal vaccine.** / Consult your health care provider.  Hepatitis A vaccine.** / Consult your health care provider.  Hepatitis B vaccine.** / Consult your health care provider.  Haemophilus influenzae type b (Hib) vaccine.** / Consult your health care provider. Ages 65   years and over  Blood pressure check.** / Every 1 to 2 years.  Lipid and cholesterol check.** / Every 5 years beginning at age 22 years.  Lung cancer screening. / Every year if you are aged 73-80 years and have a 30-pack-year history of smoking and currently smoke or have quit within the past 15 years. Yearly screening is stopped once you have quit smoking for at least 15 years or develop a health problem that would prevent you from having lung cancer  treatment.  Clinical breast exam.** / Every year after age 4 years.  BRCA-related cancer risk assessment.** / For women who have family members with a BRCA-related cancer (breast, ovarian, tubal, or peritoneal cancers).  Mammogram.** / Every year beginning at age 40 years and continuing for as long as you are in good health. Consult with your health care provider.  Pap test.** / Every 3 years starting at age 9 years through age 34 or 91 years with 3 consecutive normal Pap tests. Testing can be stopped between 65 and 70 years with 3 consecutive normal Pap tests and no abnormal Pap or HPV tests in the past 10 years.  HPV screening.** / Every 3 years from ages 57 years through ages 64 or 45 years with a history of 3 consecutive normal Pap tests. Testing can be stopped between 65 and 70 years with 3 consecutive normal Pap tests and no abnormal Pap or HPV tests in the past 10 years.  Fecal occult blood test (FOBT) of stool. / Every year beginning at age 15 years and continuing until age 17 years. You may not need to do this test if you get a colonoscopy every 10 years.  Flexible sigmoidoscopy or colonoscopy.** / Every 5 years for a flexible sigmoidoscopy or every 10 years for a colonoscopy beginning at age 86 years and continuing until age 71 years.  Hepatitis C blood test.** / For all people born from [birth year] through 1965 and any individual with known risks for hepatitis C.  Osteoporosis screening.** / A one-time screening for women ages 83 years and over and women at risk for fractures or osteoporosis.  Skin self-exam. / Monthly.  Influenza vaccine. / Every year.  Tetanus, diphtheria, and acellular pertussis (Tdap/Td) vaccine.** / 1 dose of Td every 10 years.  Varicella vaccine.** / Consult your health care provider.  Zoster vaccine.** / 1 dose for adults aged 61 years or older.  Pneumococcal 13-valent conjugate (PCV13) vaccine.** / Consult your health care provider.  Pneumococcal  polysaccharide (PPSV23) vaccine.** / 1 dose for all adults aged 28 years and older.  Meningococcal vaccine.** / Consult your health care provider.  Hepatitis A vaccine.** / Consult your health care provider.  Hepatitis B vaccine.** / Consult your health care provider.  Haemophilus influenzae type b (Hib) vaccine.** / Consult your health care provider. ** Family history and personal history of risk and conditions may change your health care provider's recommendations. Document Released: 03/07/2001 Document Revised: 05/26/2013 Document Reviewed: 06/06/2010 [facility] Patient Information 2015 [place], [place]. This information is not intended to replace advice given to you by your health care provider. Make sure you discuss any questions you have with your health care provider.

## 2014-03-19 NOTE — Progress Notes (Signed)
Pre visit review using our clinic review tool, if applicable. No additional management support is needed unless otherwise documented below in the visit note. 

## 2014-03-19 NOTE — Progress Notes (Signed)
Subjective:     [name] is a 49 y.o. female and is here for a comprehensive physical exam. The patient reports no problems. She also needs blood work done secondary to hx of gastric bypass.     History   Social History  . Marital Status: Married    Spouse Name: N/A  . Number of Children: N/A  . Years of Education: N/A   Occupational History  .      [employer]   Social History Main Topics  . Smoking status: Former Smoker    Quit date: 01/24/1987  . Smokeless tobacco: Never Used  . Alcohol Use: Yes     Comment: occasionally  . Drug Use: No  . Sexual Activity: Not Currently   Other Topics Concern  . Not on file   Social History Narrative   Regular exercise--yes   Health Maintenance  Topic Date Due  . HIV Screening  04/08/1980  . INFLUENZA VACCINE  08/24/2014  . MAMMOGRAM  10/10/2014  . PAP SMEAR  04/02/2016  . TETANUS/TDAP  02/26/2018    The following portions of the patient's history were reviewed and updated as appropriate:  She  has a past medical history of Allergy; GERD (gastroesophageal reflux disease); Depression; Anemia; Anxiety; Asthma; and Heart murmur. She  does not have any pertinent problems on file. She  has past surgical history that includes Abdominal hysterectomy; Gastric bypass; and Tubal ligation. Her family history includes Cancer in her father, maternal grandfather, and paternal grandfather; Depression in her brother and sister; Diabetes in her maternal grandfather, maternal grandmother, paternal grandfather, and paternal grandmother; Hypertension in her father; Liver disease in her maternal grandfather; Stroke in her mother. She  reports that she quit smoking about 27 years ago. She has never used smokeless tobacco. She reports that she drinks alcohol. She reports that she does not use illicit drugs. She has a current medication list which includes the following prescription(s): alprazolam, carafate, vitamin d, cyclobenzaprine,  dextroamphetamine, ferralet 90, fluticasone, hydrocodone-acetaminophen, loperamide, pantoprazole, ropinirole, sertraline, trimethoprim, and vitamin b-12. Current Outpatient Prescriptions on File Prior to Visit  Medication Sig Dispense Refill  . ALPRAZolam (XANAX) 1 MG tablet     . Cholecalciferol (VITAMIN D) 2000 UNITS CAPS Take 1 capsule by mouth daily.      Marland Kitchen dextroamphetamine (DEXTROSTAT) 10 MG tablet Take 10 mg by mouth 3 (three) times daily.    . fluticasone (FLONASE) 50 MCG/ACT nasal spray Place 2 sprays into both nostrils daily. 16 g 6  . loperamide (ANTI-DIARRHEAL) 2 MG capsule Take 2 mg by mouth 3 (three) times daily.    Marland Kitchen trimethoprim (TRIMPEX) 100 MG tablet TAKE 1 TABLET (100 MG TOTAL) BY MOUTH 2 (TWO) TIMES DAILY. 60 tablet 5  . vitamin B-12 (CYANOCOBALAMIN) 1000 MCG tablet Take 1,000 mcg by mouth daily.      . [DISCONTINUED] Azelastine-Fluticasone (DYMISTA) 137-50 MCG/ACT SUSP Place 1 spray into the nose 2 (two) times daily.    . [DISCONTINUED] loratadine (CLARITIN) 10 MG tablet Take 10 mg by mouth daily.      . [DISCONTINUED] montelukast (SINGULAIR) 10 MG tablet Take 10 mg by mouth at bedtime.      . [DISCONTINUED] RABEprazole (ACIPHEX) 20 MG tablet Take 20 mg by mouth daily.      . [DISCONTINUED] traZODone (DESYREL) 100 MG tablet Take 100 mg by mouth at bedtime.       No current facility-administered medications on file prior to visit.   She is allergic to codeine and  penicillins..  Review of Systems  Review of Systems  Constitutional: Negative for activity change, appetite change and fatigue.  HENT: Negative for hearing loss, congestion, tinnitus and ear discharge.  dentist q35m Eyes: Negative for visual disturbance (see optho q1y -- vision corrected to 20/20 with glasses).  Respiratory: Negative for cough, chest tightness and shortness of breath.   Cardiovascular: Negative for chest pain, palpitations and leg swelling.  Gastrointestinal: Negative for abdominal pain,  diarrhea, constipation and abdominal distention.  Genitourinary: Negative for urgency, frequency, decreased urine volume and difficulty urinating.  Musculoskeletal: Negative for back pain, arthralgias and gait problem.  Skin: Negative for color change, pallor and rash.  Neurological: Negative for dizziness, light-headedness, numbness and headaches.  Hematological: Negative for adenopathy. Does not bruise/bleed easily.  Psychiatric/Behavioral: Negative for suicidal ideas, confusion, sleep disturbance, self-injury, dysphoric mood, decreased concentration and agitation.      Objective:    BP 106/70 mmHg  Pulse 60  Temp(Src) 98.6 F (37 C) (Oral)  Ht 5\' 4"  (1.626 m)  Wt 137 lb 9.6 oz (62.415 kg)  BMI 23.61 kg/m2  SpO2 100% General appearance: alert, cooperative, appears stated age and no distress Head: Normocephalic, without obvious abnormality, atraumatic Eyes: conjunctivae/corneas clear. PERRL, EOM's intact. Fundi benign. Ears: normal TM's and external ear canals both ears Nose: Nares normal. Septum midline. Mucosa normal. No drainage or sinus tenderness. Throat: lips, mucosa, and tongue normal; teeth and gums normal Neck: no adenopathy, no carotid bruit, no JVD, supple, symmetrical, trachea midline and thyroid not enlarged, symmetric, no tenderness/mass/nodules Back: symmetric, no curvature. ROM normal. No CVA tenderness. Lungs: clear to auscultation bilaterally Breasts: gyn Heart: S1, S2 normal Abdomen: soft, non-tender; bowel sounds normal; no masses,  no organomegaly Pelvic: deferred Extremities: extremities normal, atraumatic, no cyanosis or edema Pulses: 2+ and symmetric Skin: Skin color, texture, turgor normal. No rashes or lesions Lymph nodes: Cervical, supraclavicular, and axillary nodes normal. Neurologic: Alert and oriented X 3, normal strength and tone. Normal symmetric reflexes. Normal coordination and gait Psych- no depression, no anxiety      Assessment:     Healthy female exam.     Plan:     ghm utd Check labs See After Visit Summary for Counseling Recommendations    1. Muscle spasm Refill med--controlled - HYDROcodone-acetaminophen (NORCO/VICODIN) 5-325 MG per tablet; Take 1 tablet by mouth every 6 (six) hours as needed for moderate pain.  Dispense: 30 tablet; Refill: 0 - cyclobenzaprine (FLEXERIL) 10 MG tablet; Take 1 tablet (10 mg total) by mouth 3 (three) times daily as needed.  Dispense: 30 tablet; Refill: 2  2. RLS (restless legs syndrome)  - rOPINIRole (REQUIP) 1 MG tablet; Take 1 tablet (1 mg total) by mouth at bedtime.  Dispense: 30 tablet; Refill: 11  3. Other iron deficiency anemias  - Fe Cbn-Fe Gluc-FA-B12-C-DSS (FERRALET 90) 90-1 MG TABS; 1 po qd  Dispense: 30 each; Refill: 11  4. Preventative health care  - Basic metabolic panel - CBC with Differential/Platelet - Hepatic function panel - Lipid panel - POCT urinalysis dipstick - TSH - Vitamin B12 - Vitamin D 1,25 dihydroxy  5. H/O bariatric surgery  - Basic metabolic panel - CBC with Differential/Platelet - Hepatic function panel - Lipid panel - POCT urinalysis dipstick - TSH - Vitamin B12 - Vitamin D 1,25 dihydroxy

## 2014-03-20 NOTE — Telephone Encounter (Signed)
No call back prior to visit

## 2014-03-23 LAB — VITAMIN D 1,25 DIHYDROXY
VITAMIN D 1, 25 (OH) TOTAL: 69 pg/mL (ref 18–72)
VITAMIN D3 1, 25 (OH): 69 pg/mL
Vitamin D2 1, 25 (OH)2: <8 pg/mL

## 2014-03-24 ENCOUNTER — Other Ambulatory Visit (INDEPENDENT_AMBULATORY_CARE_PROVIDER_SITE_OTHER): Payer: Self-pay | Admitting: Surgery

## 2014-03-24 DIAGNOSIS — R1010 Upper abdominal pain, unspecified: Secondary | ICD-10-CM

## 2014-03-24 DIAGNOSIS — R109 Unspecified abdominal pain: Secondary | ICD-10-CM

## 2014-03-27 ENCOUNTER — Ambulatory Visit
Admission: RE | Admit: 2014-03-27 | Discharge: 2014-03-27 | Disposition: A | Discharge status: Home / Self Care | Payer: BLUE CROSS/BLUE SHIELD | Source: Ambulatory Visit | Attending: Surgery | Admitting: Surgery

## 2014-03-27 MED ORDER — IOPAMIDOL (ISOVUE-300) INJECTION 61%
100.0000 mL | Freq: Once | INTRAVENOUS | Status: AC | PRN
  Administered 2014-03-27: 100 mL via INTRAVENOUS

## 2014-04-20 ENCOUNTER — Telehealth: Payer: Self-pay | Admitting: Family Medicine

## 2014-04-20 NOTE — Telephone Encounter (Signed)
error:[id number] ° °

## 2014-04-21 ENCOUNTER — Encounter: Payer: Self-pay | Admitting: Family Medicine

## 2014-04-21 ENCOUNTER — Telehealth: Payer: Self-pay | Admitting: Family Medicine

## 2014-04-21 ENCOUNTER — Ambulatory Visit (INDEPENDENT_AMBULATORY_CARE_PROVIDER_SITE_OTHER): Payer: BLUE CROSS/BLUE SHIELD | Admitting: Family Medicine

## 2014-04-21 VITALS — BP 126/84 | HR 74 | Temp 98.2°F | Wt 133.4 lb

## 2014-04-21 DIAGNOSIS — J208 Acute bronchitis due to other specified organisms: Secondary | ICD-10-CM

## 2014-04-21 MED ORDER — PROMETHAZINE-DM 6.25-15 MG/5ML PO SYRP: 5.0000 mL | ORAL_SOLUTION | Freq: Four times a day (QID) | ORAL | Status: DC | PRN

## 2014-04-21 MED ORDER — METHYLPREDNISOLONE ACETATE 80 MG/ML IJ SUSP
80.0000 mg | Freq: Once | INTRAMUSCULAR | Status: AC
  Administered 2014-04-21: 80 mg via INTRAMUSCULAR

## 2014-04-21 MED ORDER — PREDNISONE 20 MG PO TABS: 20.0000 mg | ORAL_TABLET | Freq: Every day | ORAL | Status: DC

## 2014-04-21 NOTE — Progress Notes (Signed)
  Subjective:     [name] is a 49 y.o. female who presents for evaluation of symptoms of a URI. Symptoms include bilateral ear pressure/pain, fever 100, headache described as frontal pressure, post nasal drip, sinus pressure and sore throat. Onset of symptoms was 5 days ago, and has been gradually worsening since that time. Treatment to date: antihistamines, cough suppressants and decongestants.  The following portions of the patient's history were reviewed and updated as appropriate: allergies, current medications, past family history, past medical history, past social history, past surgical history and problem list.  Review of Systems Pertinent items are noted in HPI.   Objective:    BP 126/84 mmHg  Pulse 74  Temp(Src) 98.2 F (36.8 C) (Oral)  Wt 133 lb 6.4 oz (60.51 kg)  SpO2 100% General appearance: alert, cooperative, appears stated age and no distress Ears: normal TM's and external ear canals both ears Nose: clear discharge, moderate congestion, sinus tenderness bilateral Throat: abnormal findings: mild oropharyngeal erythema Neck: no adenopathy, no carotid bruit, no JVD, supple, symmetrical, trachea midline and thyroid not enlarged, symmetric, no tenderness/mass/nodules Lungs: diminished breath sounds bilaterally Heart: S1, S2 normal   Assessment:    seasonal allergies   Plan:    Discussed the importance of avoiding unnecessary antibiotic therapy. Suggested symptomatic OTC remedies. Nasal saline spray for congestion. Nasal steroids per orders. Follow up as needed.   Depo medrol 80 mg pred taper

## 2014-04-21 NOTE — Patient Instructions (Signed)

## 2014-04-21 NOTE — Telephone Encounter (Signed)
Caller name:Cotrell, Demya Relation to AL:PFXT Call back number:[phone] Pharmacy:  Reason for call: pt was seen this morning stated she needed to provide a new leave ID # [id number]

## 2014-04-21 NOTE — Telephone Encounter (Signed)
The form has been updated and placed in Dr.Lowne's red folder for pick up.      KP

## 2014-04-21 NOTE — Progress Notes (Signed)
Pre visit review using our clinic review tool, if applicable. No additional management support is needed unless otherwise documented below in the visit note. 

## 2014-04-22 ENCOUNTER — Telehealth: Payer: Self-pay | Admitting: Family Medicine

## 2014-04-22 NOTE — Telephone Encounter (Signed)
Caller name: Vaniah Relation to pt: self Call back number: [phone] Pharmacy[facility] [place]  Reason for call:   Patient states that she is now coughing up green/bloody mucous. She is still having diarrhea. Patient states that she will need her work note extended.

## 2014-04-23 MED ORDER — AZITHROMYCIN 250 MG PO TABS: ORAL_TABLET | ORAL | Status: DC

## 2014-04-23 NOTE — Telephone Encounter (Signed)
Please advise      KP 

## 2014-04-23 NOTE — Telephone Encounter (Signed)
Patient aware Rx sent and letter ready for pick up.     KP

## 2014-04-23 NOTE — Telephone Encounter (Signed)
Ok to extend note 2 more days and start z pack #1  As directed

## 2014-05-11 ENCOUNTER — Telehealth: Payer: Self-pay | Admitting: Family Medicine

## 2014-05-11 NOTE — Telephone Encounter (Signed)
The FMLA paperwork was placed in Dr Etter Sjogren red folder on 04/21/14. That was the last time I saw it.     KP

## 2014-05-11 NOTE — Telephone Encounter (Signed)
Caller name: Rickia Relation to pt: self  Call back number: [phone] Pharmacy:  Reason for call:   Patient states that she brought paperwork at last visit and paperwork was supposed to be faxed to [facility]. She also says that she wanted a copy mailed to her. She states that she has not received this copy as of yet. She says that [place] has not received paperwork as well

## 2014-05-11 NOTE — Telephone Encounter (Signed)
I remember doing it before I left and would have put it in back in [place] folder

## 2014-05-11 NOTE — Telephone Encounter (Signed)
Do you have this paperwork?

## 2014-05-12 NOTE — Telephone Encounter (Signed)
LM for patient to return the call.  Need the information to complete the form. Unable to locate in the office.

## 2014-05-14 NOTE — Telephone Encounter (Signed)
LM for patient to return the call so that we can complete paperwork.  Advised to ask for myself or Lamount Cohen.

## 2014-05-27 ENCOUNTER — Telehealth: Payer: Self-pay | Admitting: Family Medicine

## 2014-05-27 NOTE — Telephone Encounter (Signed)
Caller: [name] Rel to pt: self Ph # : [phone] can't be reached until after 3pm. 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   Reason: she is calling back about completing paperwork. She states paperwork from her appt got lost. She said we were to send information/paperwork to [employer]. Pt states she needs to have her job description and her signature on the paperwork before [place] will approve. Advised pt you leave at 4pm and to call by 3:30pm 05/28/14 if she hasn't gotten a call back. Thanks.

## 2014-05-28 NOTE — Telephone Encounter (Signed)
Spoke with [name] and she stated she missed work due to flare ups of IBS from 04/20/2014-04/27/2014. I informed [name] that forms are filled out with those dates noted. She is to come by our office today or tomorrow to fill out and sign her portion of the forms. JG//CMA

## 2014-05-28 NOTE — Telephone Encounter (Signed)
Called and left message for pt to return call.  If pt calls back, please ask her if there are certain dates that she has missed work that needs to be listed on her forms.

## 2014-06-01 NOTE — Telephone Encounter (Signed)
[employer] mailed to pt's home address and copy sent for scanning. JG//CMA

## 2014-06-01 NOTE — Telephone Encounter (Signed)
Pt came in office on Friday, filled out document where it was needed to be signed and filled out, pt saw on document the ID # was needed to be changed, pt updated the ID # and stated that needed document ASAP. Thank you.

## 2014-06-01 NOTE — Telephone Encounter (Signed)
Paperwork faxed to [employer] at [phone] successfully. JG//CMA

## 2014-07-01 ENCOUNTER — Ambulatory Visit: Payer: BLUE CROSS/BLUE SHIELD | Admitting: Medical

## 2014-09-07 ENCOUNTER — Ambulatory Visit (INDEPENDENT_AMBULATORY_CARE_PROVIDER_SITE_OTHER): Payer: BLUE CROSS/BLUE SHIELD | Admitting: Family Medicine

## 2014-09-07 ENCOUNTER — Encounter: Payer: Self-pay | Admitting: Family Medicine

## 2014-09-07 ENCOUNTER — Other Ambulatory Visit: Payer: Self-pay | Admitting: Gynecology

## 2014-09-07 VITALS — BP 116/74 | HR 71 | Temp 98.4°F | Wt 137.6 lb

## 2014-09-07 DIAGNOSIS — J329 Chronic sinusitis, unspecified: Secondary | ICD-10-CM | POA: Diagnosis not present

## 2014-09-07 DIAGNOSIS — N63 Unspecified lump in unspecified breast: Secondary | ICD-10-CM

## 2014-09-07 MED ORDER — CLARITHROMYCIN ER 500 MG PO TB24: 1000.0000 mg | ORAL_TABLET | Freq: Every day | ORAL | Status: DC

## 2014-09-07 NOTE — Patient Instructions (Signed)

## 2014-09-07 NOTE — Progress Notes (Signed)
Pre visit review using our clinic review tool, if applicable. No additional management support is needed unless otherwise documented below in the visit note. 

## 2014-09-07 NOTE — Progress Notes (Signed)
Patient ID: [name], female    DOB: [birth year]  Age: 50 y.o. MRN: [id number]    Subjective:  Subjective HPI [name] presents c/o sinus headache and congestion since last week.  He teeth hurt terribly.  Pain on L side.  No fever.  + L ear itchy.  Pt is taking chlortrimeton with no relief.  When she bends over the pressure is bad.    Review of Systems  Constitutional: Positive for chills. Negative for fever.  HENT: Positive for congestion, postnasal drip, rhinorrhea and sinus pressure.   Respiratory: Negative for cough, chest tightness, shortness of breath and wheezing.   Cardiovascular: Negative for chest pain, palpitations and leg swelling.  Allergic/Immunologic: Negative for environmental allergies.    History Past Medical History  Diagnosis Date  . Allergy   . GERD (gastroesophageal reflux disease)   . Depression   . Anemia   . Anxiety   . Asthma   . Heart murmur     She has past surgical history that includes Abdominal hysterectomy; Gastric bypass; and Tubal ligation.   Her family history includes Cancer in her father, maternal grandfather, and paternal grandfather; Depression in her brother and sister; Diabetes in her maternal grandfather, maternal grandmother, paternal grandfather, and paternal grandmother; Hypertension in her father; Liver disease in her maternal grandfather; Stroke in her mother.She reports that she quit smoking about 27 years ago. She has never used smokeless tobacco. She reports that she drinks alcohol. She reports that she does not use illicit drugs.  Current Outpatient Prescriptions on File Prior to Visit  Medication Sig Dispense Refill  . ALPRAZolam (XANAX) 1 MG tablet     . CARAFATE 1 GM/10ML suspension 10 mls by mouth twice daily  0  . Cholecalciferol (VITAMIN D) 2000 UNITS CAPS Take 1 capsule by mouth daily.      . cyclobenzaprine (FLEXERIL) 10 MG tablet Take 1 tablet (10 mg total) by mouth 3 (three) times daily as needed. 30 tablet 2    . dextroamphetamine (DEXTROSTAT) 10 MG tablet Take 10 mg by mouth 3 (three) times daily.    Marland Kitchen Fe Cbn-Fe Gluc-FA-B12-C-DSS (FERRALET 90) 90-1 MG TABS 1 po qd 30 each 11  . fluticasone (FLONASE) 50 MCG/ACT nasal spray Place 2 sprays into both nostrils daily. 16 g 6  . loperamide (ANTI-DIARRHEAL) 2 MG capsule Take 2 mg by mouth 3 (three) times daily.    . pantoprazole (PROTONIX) 20 MG tablet Take 20 mg by mouth daily.  3  . rOPINIRole (REQUIP) 1 MG tablet Take 1 tablet (1 mg total) by mouth at bedtime. 30 tablet 11  . sertraline (ZOLOFT) 100 MG tablet Take 3 tablets by mouth daily.    Marland Kitchen trimethoprim (TRIMPEX) 100 MG tablet TAKE 1 TABLET (100 MG TOTAL) BY MOUTH 2 (TWO) TIMES DAILY. 60 tablet 5  . vitamin B-12 (CYANOCOBALAMIN) 1000 MCG tablet Take 1,000 mcg by mouth daily.      . [DISCONTINUED] Azelastine-Fluticasone (DYMISTA) 137-50 MCG/ACT SUSP Place 1 spray into the nose 2 (two) times daily.    . [DISCONTINUED] loratadine (CLARITIN) 10 MG tablet Take 10 mg by mouth daily.      . [DISCONTINUED] montelukast (SINGULAIR) 10 MG tablet Take 10 mg by mouth at bedtime.      . [DISCONTINUED] RABEprazole (ACIPHEX) 20 MG tablet Take 20 mg by mouth daily.      . [DISCONTINUED] traZODone (DESYREL) 100 MG tablet Take 100 mg by mouth at bedtime.       No current  facility-administered medications on file prior to visit.     Objective:  Objective Physical Exam  Constitutional: She is oriented to person, place, and time. She appears well-developed and well-nourished.  HENT:  Right Ear: Hearing, tympanic membrane, external ear and ear canal normal.  Left Ear: Hearing, tympanic membrane, external ear and ear canal normal.  Nose: Right sinus exhibits no maxillary sinus tenderness and no frontal sinus tenderness. Left sinus exhibits maxillary sinus tenderness and frontal sinus tenderness.  Mouth/Throat: Posterior oropharyngeal erythema present. No oropharyngeal exudate, posterior oropharyngeal edema or tonsillar  abscesses.  + PND + errythema  Eyes: Conjunctivae are normal. Right eye exhibits no discharge. Left eye exhibits no discharge.  Cardiovascular: Normal rate, regular rhythm and normal heart sounds.   No murmur heard. Pulmonary/Chest: Effort normal and breath sounds normal. No respiratory distress. She has no wheezes. She has no rales. She exhibits no tenderness.  Musculoskeletal: She exhibits no edema.  Lymphadenopathy:    She has cervical adenopathy.  Neurological: She is alert and oriented to person, place, and time.  Psychiatric: She has a normal mood and affect. Her behavior is normal.   BP 116/74 mmHg  Pulse 71  Temp(Src) 98.4 F (36.9 C) (Oral)  Wt 137 lb 9.6 oz (62.415 kg)  SpO2 99% Wt Readings from Last 3 Encounters:  09/07/14 137 lb 9.6 oz (62.415 kg)  04/21/14 133 lb 6.4 oz (60.51 kg)  03/19/14 137 lb 9.6 oz (62.415 kg)     Lab Results  Component Value Date   WBC 3.9* 03/19/2014   HGB 11.5* 03/19/2014   HCT 34.4* 03/19/2014   PLT 245.0 03/19/2014   GLUCOSE 71 03/19/2014   CHOL 160 03/19/2014   TRIG 49.0 03/19/2014   HDL 65.20 03/19/2014   LDLCALC 85 03/19/2014   ALT 20 03/19/2014   AST 26 03/19/2014   NA 138 03/19/2014   K 3.7 03/19/2014   CL 107 03/19/2014   CREATININE 0.73 03/19/2014   BUN 14 03/19/2014   CO2 26 03/19/2014   TSH 1.68 03/19/2014    Ct Abdomen Pelvis W Contrast  03/29/2014   CLINICAL DATA:  Generalized abdominal pain for 6 months. Nausea. Previous gastric bypass surgery.  EXAM: CT ABDOMEN AND PELVIS WITH CONTRAST  TECHNIQUE: Multidetector CT imaging of the abdomen and pelvis was performed using the standard protocol following bolus administration of intravenous contrast.  CONTRAST:  100 mL Isovue 300  COMPARISON:  03/02/2012  FINDINGS: Lower Chest:  Unremarkable.  Hepatobiliary: Focal fatty infiltration again seen in the left lobe adjacent to the falciform ligament. No masses or other significant abnormality identified. Gallbladder is  unremarkable.  Pancreas: No mass, inflammatory changes, or other significant abnormality identified.  Spleen:  Within normal limits in size and appearance.  Adrenals:  No masses identified.  Kidneys/Urinary Tract:  No evidence of masses or hydronephrosis.  Stomach/Bowel/Peritoneum: Postop changes again seen from previous gastric bypass surgery. No evidence of wall thickening, mass, or obstruction.  Vascular/Lymphatic: No pathologically enlarged lymph nodes identified. No other significant abnormality visualized.  Reproductive:  No mass or other significant abnormality identified.  Other:  None.  Musculoskeletal: No suspicious bone lesions identified. Bilateral L5 pars defects again incidentally noted, without associated spondylolisthesis.  IMPRESSION: Stable exam. No acute findings or other significant abnormality identified within the abdomen or pelvis.   Electronically Signed   By: Earle Gell M.D.   On: 03/29/2014 08:08     Assessment & Plan:  Plan I have discontinued Ms. Slone's HYDROcodone-acetaminophen, predniSONE, promethazine-dextromethorphan,  and azithromycin. I am also having her start on clarithromycin. Additionally, I am having her maintain her vitamin B-12, Vitamin D, dextroamphetamine, loperamide, ALPRAZolam, trimethoprim, fluticasone, pantoprazole, CARAFATE, sertraline, cyclobenzaprine, FERRALET 90, and rOPINIRole.  Meds ordered this encounter  Medications  . clarithromycin (BIAXIN XL) 500 MG 24 hr tablet    Sig: Take 2 tablets (1,000 mg total) by mouth daily.    Dispense:  28 tablet    Refill:  0    Problem List Items Addressed This Visit    None    Visit Diagnoses    Recurrent sinusitis    -  Primary    Relevant Medications    clarithromycin (BIAXIN XL) 500 MG 24 hr tablet    Other Relevant Orders    Ambulatory referral to ENT    CT Maxillofacial WO CM     con't steroid nasal spray and antihistamine  Follow-up: Return if symptoms worsen or fail to improve.  Garnet Koyanagi, DO

## 2014-09-08 ENCOUNTER — Ambulatory Visit (HOSPITAL_BASED_OUTPATIENT_CLINIC_OR_DEPARTMENT_OTHER)
Admission: RE | Admit: 2014-09-08 | Discharge: 2014-09-08 | Disposition: A | Discharge status: Home / Self Care | Payer: BLUE CROSS/BLUE SHIELD | Source: Ambulatory Visit | Attending: Family Medicine | Admitting: Family Medicine

## 2014-09-08 DIAGNOSIS — R0600 Dyspnea, unspecified: Secondary | ICD-10-CM | POA: Diagnosis not present

## 2014-09-08 DIAGNOSIS — J329 Chronic sinusitis, unspecified: Secondary | ICD-10-CM | POA: Diagnosis present

## 2014-10-09 ENCOUNTER — Ambulatory Visit
Admission: RE | Admit: 2014-10-09 | Discharge: 2014-10-09 | Disposition: A | Discharge status: Home / Self Care | Payer: BLUE CROSS/BLUE SHIELD | Source: Ambulatory Visit | Attending: Gynecology | Admitting: Gynecology

## 2014-10-09 DIAGNOSIS — N63 Unspecified lump in unspecified breast: Secondary | ICD-10-CM

## 2014-11-12 ENCOUNTER — Telehealth: Payer: Self-pay | Admitting: Family Medicine

## 2014-11-12 NOTE — Telephone Encounter (Signed)
Pt brought in FMLA paperwork to be filled out.

## 2014-11-16 DIAGNOSIS — Z0279 Encounter for issue of other medical certificate: Secondary | ICD-10-CM

## 2014-11-16 NOTE — Telephone Encounter (Signed)
FMLA forms filled out and forwarded to Dr. Etter Sjogren for signature. JG//CMA

## 2014-11-16 NOTE — Telephone Encounter (Signed)
Forms filled out as much as possible and forwarded to Dr. Etter Sjogren. JG//CMA

## 2014-11-17 ENCOUNTER — Ambulatory Visit (INDEPENDENT_AMBULATORY_CARE_PROVIDER_SITE_OTHER): Payer: BLUE CROSS/BLUE SHIELD

## 2014-11-17 DIAGNOSIS — Z23 Encounter for immunization: Secondary | ICD-10-CM | POA: Diagnosis not present

## 2014-11-17 NOTE — Telephone Encounter (Signed)
Completed forms faxed to [facility] at [phone] and mailed to pt's home address per pt request. Copy sent for scanning. JG//CMA

## 2014-11-19 ENCOUNTER — Encounter: Payer: Self-pay | Admitting: Family Medicine

## 2014-11-20 NOTE — Telephone Encounter (Signed)
-----   Message from Rosalita Chessman, DO sent at 11/19/2014 11:48 PM EDT ----- I sent her a my chart message but want to make sure she gets the message------- send email to ben with all the info about the dog and he will put it in his news letter [email or web address]

## 2014-11-20 NOTE — Telephone Encounter (Signed)
The patient has been made aware and she has agreed to check her my-chart when she gets home.      KP

## 2014-11-25 NOTE — Telephone Encounter (Addendum)
Relation to LV:DIXV  Call back number:[phone]   Reason for call:  [place] email patient advising her that paperwork was incomplete Page 3 question 3 was not filled out. Please follow up with patient directly

## 2014-11-30 NOTE — Telephone Encounter (Signed)
Changes made and faxed to [facility] successfully. JG//CMA

## 2015-03-19 ENCOUNTER — Encounter: Payer: BLUE CROSS/BLUE SHIELD | Admitting: Family Medicine

## 2015-03-27 ENCOUNTER — Other Ambulatory Visit: Payer: Self-pay | Admitting: Family Medicine

## 2015-04-19 ENCOUNTER — Telehealth: Payer: Self-pay

## 2015-04-20 ENCOUNTER — Encounter: Payer: Self-pay | Admitting: Family Medicine

## 2015-04-20 ENCOUNTER — Ambulatory Visit (INDEPENDENT_AMBULATORY_CARE_PROVIDER_SITE_OTHER): Payer: BLUE CROSS/BLUE SHIELD | Admitting: Family Medicine

## 2015-04-20 VITALS — BP 124/80 | HR 65 | Temp 98.1°F | Ht 62.0 in | Wt 136.8 lb

## 2015-04-20 DIAGNOSIS — M25562 Pain in left knee: Secondary | ICD-10-CM

## 2015-04-20 DIAGNOSIS — J0110 Acute frontal sinusitis, unspecified: Secondary | ICD-10-CM | POA: Diagnosis not present

## 2015-04-20 DIAGNOSIS — M62838 Other muscle spasm: Secondary | ICD-10-CM | POA: Diagnosis not present

## 2015-04-20 DIAGNOSIS — N390 Urinary tract infection, site not specified: Secondary | ICD-10-CM

## 2015-04-20 DIAGNOSIS — Z0000 Encounter for general adult medical examination without abnormal findings: Secondary | ICD-10-CM | POA: Diagnosis not present

## 2015-04-20 DIAGNOSIS — L609 Nail disorder, unspecified: Secondary | ICD-10-CM

## 2015-04-20 DIAGNOSIS — G2581 Restless legs syndrome: Secondary | ICD-10-CM

## 2015-04-20 LAB — CBC WITH DIFFERENTIAL/PLATELET
BASOS ABS: 0 10*3/uL (ref 0.0–0.1)
Basophils Relative: 0.8 % (ref 0.0–3.0)
EOS PCT: 3.3 % (ref 0.0–5.0)
Eosinophils Absolute: 0.2 10*3/uL (ref 0.0–0.7)
HEMATOCRIT: 34.1 % — AB (ref 36.0–46.0)
Hemoglobin: 11.3 g/dL — ABNORMAL LOW (ref 12.0–15.0)
LYMPHS ABS: 1.7 10*3/uL (ref 0.7–4.0)
LYMPHS PCT: 33.8 % (ref 12.0–46.0)
MCHC: 33.2 g/dL (ref 30.0–36.0)
MCV: 80.7 fl (ref 78.0–100.0)
MONOS PCT: 6.8 % (ref 3.0–12.0)
Monocytes Absolute: 0.3 10*3/uL (ref 0.1–1.0)
Neutro Abs: 2.7 10*3/uL (ref 1.4–7.7)
Neutrophils Relative %: 55.3 % (ref 43.0–77.0)
Platelets: 249 10*3/uL (ref 150.0–400.0)
RBC: 4.23 Mil/uL (ref 3.87–5.11)
RDW: 15.4 % (ref 11.5–15.5)
WBC: 4.9 10*3/uL (ref 4.0–10.5)

## 2015-04-20 LAB — LIPID PANEL
CHOL/HDL RATIO: 2
Cholesterol: 167 mg/dL (ref 0–200)
HDL: 70.3 mg/dL (ref >39.00)
LDL Cholesterol: 85 mg/dL (ref 0–99)
NONHDL: 96.94
TRIGLYCERIDES: 61 mg/dL (ref 0.0–149.0)
VLDL: 12.2 mg/dL (ref 0.0–40.0)

## 2015-04-20 LAB — COMPREHENSIVE METABOLIC PANEL
ALBUMIN: 4.1 g/dL (ref 3.5–5.2)
ALK PHOS: 49 U/L (ref 39–117)
ALT: 17 U/L (ref 0–35)
AST: 22 U/L (ref 0–37)
BILIRUBIN TOTAL: 0.5 mg/dL (ref 0.2–1.2)
BUN: 15 mg/dL (ref 6–23)
CALCIUM: 8.9 mg/dL (ref 8.4–10.5)
CO2: 24 mEq/L (ref 19–32)
Chloride: 108 mEq/L (ref 96–112)
Creatinine, Ser: 0.61 mg/dL (ref 0.40–1.20)
GFR: 110.33 mL/min (ref >60.00)
Glucose, Bld: 93 mg/dL (ref 70–99)
POTASSIUM: 3.6 meq/L (ref 3.5–5.1)
Sodium: 141 mEq/L (ref 135–145)
TOTAL PROTEIN: 6.7 g/dL (ref 6.0–8.3)

## 2015-04-20 LAB — TSH: TSH: 2.04 u[IU]/mL (ref 0.35–4.50)

## 2015-04-20 LAB — VITAMIN B12: VITAMIN B 12: 220 pg/mL (ref 211–911)

## 2015-04-20 MED ORDER — CYCLOBENZAPRINE HCL 10 MG PO TABS
10.0000 mg | ORAL_TABLET | Freq: Three times a day (TID) | ORAL | Status: DC | PRN
Start: 2015-04-20 — End: 2015-12-13

## 2015-04-20 MED ORDER — FLUTICASONE PROPIONATE 50 MCG/ACT NA SUSP: 2.0000 | Freq: Every day | NASAL | Status: DC

## 2015-04-20 MED ORDER — ROPINIROLE HCL 1 MG PO TABS: ORAL_TABLET | ORAL | Status: DC

## 2015-04-20 MED ORDER — TRIMETHOPRIM 100 MG PO TABS: ORAL_TABLET | ORAL | Status: DC

## 2015-04-20 NOTE — Progress Notes (Signed)
Pre visit review using our clinic review tool, if applicable. No additional management support is needed unless otherwise documented below in the visit note. 

## 2015-04-20 NOTE — Progress Notes (Signed)
Subjective:     [name] is a 50 y.o. female and is here for a comprehensive physical exam. The patient reports no problems.  Social History   Social History  . Marital Status: Married    Spouse Name: N/A  . Number of Children: N/A  . Years of Education: N/A   Occupational History  .      [employer]   Social History Main Topics  . Smoking status: Former Smoker    Quit date: 01/24/1987  . Smokeless tobacco: Never Used  . Alcohol Use: Yes     Comment: occasionally  . Drug Use: No  . Sexual Activity: Not Currently   Other Topics Concern  . Not on file   Social History Narrative   Regular exercise--yes   Health Maintenance  Topic Date Due  . HIV Screening  04/21/2015 (Originally 04/08/1980)  . INFLUENZA VACCINE  08/24/2015  . MAMMOGRAM  10/09/2015  . PAP SMEAR  04/02/2016  . TETANUS/TDAP  02/26/2018  . COLONOSCOPY  04/16/2022    The following portions of the patient's history were reviewed and updated as appropriate:  She  has a past medical history of Allergy; GERD (gastroesophageal reflux disease); Depression; Anemia; Anxiety; Asthma; and Heart murmur. She  does not have any pertinent problems on file. She  has past surgical history that includes Abdominal hysterectomy; Gastric bypass; and Tubal ligation. Her family history includes Cancer in her father, maternal grandfather, and paternal grandfather; Depression in her brother and sister; Diabetes in her maternal grandfather, maternal grandmother, paternal grandfather, and paternal grandmother; Hypertension in her father; Liver disease in her maternal grandfather; Stroke in her mother. She  reports that she quit smoking about 28 years ago. She has never used smokeless tobacco. She reports that she drinks alcohol. She reports that she does not use illicit drugs. She has a current medication list which includes the following prescription(s): alprazolam, carafate, vitamin d, cyclobenzaprine, dextroamphetamine,  ferralet 90, fluticasone, loperamide, pantoprazole, ropinirole, sertraline, trimethoprim, and vitamin b-12. Current Outpatient Prescriptions on File Prior to Visit  Medication Sig Dispense Refill  . ALPRAZolam (XANAX) 1 MG tablet     . CARAFATE 1 GM/10ML suspension 10 mls by mouth twice daily  0  . Cholecalciferol (VITAMIN D) 2000 UNITS CAPS Take 1 capsule by mouth daily.      Marland Kitchen dextroamphetamine (DEXTROSTAT) 10 MG tablet Take 10 mg by mouth 3 (three) times daily.    Marland Kitchen Fe Cbn-Fe Gluc-FA-B12-C-DSS (FERRALET 90) 90-1 MG TABS 1 po qd 30 each 11  . loperamide (ANTI-DIARRHEAL) 2 MG capsule Take 2 mg by mouth 3 (three) times daily.    . pantoprazole (PROTONIX) 20 MG tablet Take 20 mg by mouth daily.  3  . sertraline (ZOLOFT) 100 MG tablet Take 3 tablets by mouth daily.    . vitamin B-12 (CYANOCOBALAMIN) 1000 MCG tablet Take 1,000 mcg by mouth daily.      . [DISCONTINUED] Azelastine-Fluticasone (DYMISTA) 137-50 MCG/ACT SUSP Place 1 spray into the nose 2 (two) times daily.    . [DISCONTINUED] loratadine (CLARITIN) 10 MG tablet Take 10 mg by mouth daily.      . [DISCONTINUED] montelukast (SINGULAIR) 10 MG tablet Take 10 mg by mouth at bedtime.      . [DISCONTINUED] RABEprazole (ACIPHEX) 20 MG tablet Take 20 mg by mouth daily.      . [DISCONTINUED] traZODone (DESYREL) 100 MG tablet Take 100 mg by mouth at bedtime.       No current facility-administered medications on  file prior to visit.   She is allergic to codeine and penicillins..  Review of Systems Review of Systems  Constitutional: Negative for activity change, appetite change and fatigue.  HENT: Negative for hearing loss, congestion, tinnitus and ear discharge.  dentist q50m Eyes: Negative for visual disturbance (see optho q1y -- vision corrected to 20/20 with glasses).  Respiratory: Negative for cough, chest tightness and shortness of breath.   Cardiovascular: Negative for chest pain, palpitations and leg swelling.  Gastrointestinal:  Negative for abdominal pain, diarrhea, constipation and abdominal distention.  Genitourinary: Negative for urgency, frequency, decreased urine volume and difficulty urinating.  Musculoskeletal: Negative for back pain, arthralgias and gait problem.  Skin: Negative for color change, pallor and rash.  Neurological: Negative for dizziness, light-headedness, numbness and headaches.  Hematological: Negative for adenopathy. Does not bruise/bleed easily.  Psychiatric/Behavioral: Negative for suicidal ideas, confusion, sleep disturbance, self-injury, dysphoric mood, decreased concentration and agitation.       Objective:    BP 124/80 mmHg  Pulse 65  Temp(Src) 98.1 F (36.7 C) (Oral)  Ht 5\' 2"  (1.575 m)  Wt 136 lb 12.8 oz (62.052 kg)  BMI 25.01 kg/m2  SpO2 96% General appearance: alert, cooperative, appears stated age and no distress Head: Normocephalic, without obvious abnormality, atraumatic Eyes: conjunctivae/corneas clear. PERRL, EOM's intact. Fundi benign. Ears: normal TM's and external ear canals both ears Nose: Nares normal. Septum midline. Mucosa normal. No drainage or sinus tenderness. Throat: lips, mucosa, and tongue normal; teeth and gums normal Neck: no adenopathy, no carotid bruit, no JVD, supple, symmetrical, trachea midline and thyroid not enlarged, symmetric, no tenderness/mass/nodules Back: symmetric, no curvature. ROM normal. No CVA tenderness. Lungs: clear to auscultation bilaterally Breasts: gyn Heart: regular rate and rhythm, S1, S2 normal, no murmur, click, rub or gallop Abdomen: soft, non-tender; bowel sounds normal; no masses,  no organomegaly Pelvic: deferred--gyn Extremities: extremities normal, atraumatic, no cyanosis or edema Pulses: 2+ and symmetric Skin: Skin color, texture, turgor normal. No rashes or lesions Lymph nodes: Cervical, supraclavicular, and axillary nodes normal. Neurologic: Alert and oriented X 3, normal strength and tone. Normal symmetric  reflexes. Normal coordination and gait Psych- no depression, no anxiety      Assessment:    Healthy female exam.      Plan:    ghm utd Check labs See After Visit Summary for Counseling Recommendations    1. Recurrent UTI  - trimethoprim (TRIMPEX) 100 MG tablet; TAKE 1 TABLET (100 MG TOTAL) BY MOUTH 2 (TWO) TIMES DAILY.  Dispense: 180 tablet; Refill: 3  2. Acute frontal sinusitis, recurrence not specified  - fluticasone (FLONASE) 50 MCG/ACT nasal spray; Place 2 sprays into both nostrils daily.  Dispense: 16 g; Refill: 11  3. Muscle spasm  - cyclobenzaprine (FLEXERIL) 10 MG tablet; Take 1 tablet (10 mg total) by mouth 3 (three) times daily as needed.  Dispense: 30 tablet; Refill: 2  4. RLS (restless legs syndrome)  - rOPINIRole (REQUIP) 1 MG tablet; TAKE 1 TABLET (1 MG TOTAL) BY MOUTH AT BEDTIME.  Dispense: 90 tablet; Refill: 3  5. Preventative health care See above Check labs See AVS - Vitamin D 1,25 dihydroxy - Vitamin B12 - TSH - POCT urinalysis dipstick - Lipid panel - CBC with Differential/Platelet - Comprehensive metabolic panel

## 2015-04-20 NOTE — Patient Instructions (Signed)
Preventive Care for Adults, Female A healthy lifestyle and preventive care can promote health and wellness. Preventive health guidelines for women include the following key practices.  A routine yearly physical is a good way to check with your health care provider about your health and preventive screening. It is a chance to share any concerns and updates on your health and to receive a thorough exam.  Visit your dentist for a routine exam and preventive care every 6 months. Brush your teeth twice a day and floss once a day. Good oral hygiene prevents tooth decay and gum disease.  The frequency of eye exams is based on your age, health, family medical history, use of contact lenses, and other factors. Follow your health care provider's recommendations for frequency of eye exams.  Eat a healthy diet. Foods like vegetables, fruits, whole grains, low-fat dairy products, and lean protein foods contain the nutrients you need without too many calories. Decrease your intake of foods high in solid fats, added sugars, and salt. Eat the right amount of calories for you.Get information about a proper diet from your health care provider, if necessary.  Regular physical exercise is one of the most important things you can do for your health. Most adults should get at least 150 minutes of moderate-intensity exercise (any activity that increases your heart rate and causes you to sweat) each week. In addition, most adults need muscle-strengthening exercises on 2 or more days a week.  Maintain a healthy weight. The body mass index (BMI) is a screening tool to identify possible weight problems. It provides an estimate of body fat based on height and weight. Your health care provider can find your BMI and can help you achieve or maintain a healthy weight.For adults 20 years and older:  A BMI below 18.5 is considered underweight.  A BMI of 18.5 to 24.9 is normal.  A BMI of 25 to 29.9 is considered overweight.  A  BMI of 30 and above is considered obese.  Maintain normal blood lipids and cholesterol levels by exercising and minimizing your intake of saturated fat. Eat a balanced diet with plenty of fruit and vegetables. Blood tests for lipids and cholesterol should begin at age 45 and be repeated every 5 years. If your lipid or cholesterol levels are high, you are over 50, or you are at high risk for heart disease, you may need your cholesterol levels checked more frequently.Ongoing high lipid and cholesterol levels should be treated with medicines if diet and exercise are not working.  If you smoke, find out from your health care provider how to quit. If you do not use tobacco, do not start.  Lung cancer screening is recommended for adults aged 45-80 years who are at high risk for developing lung cancer because of a history of smoking. A yearly low-dose CT scan of the lungs is recommended for people who have at least a 30-pack-year history of smoking and are a current smoker or have quit within the past 15 years. A pack year of smoking is smoking an average of 1 pack of cigarettes a day for 1 year (for example: 1 pack a day for 30 years or 2 packs a day for 15 years). Yearly screening should continue until the smoker has stopped smoking for at least 15 years. Yearly screening should be stopped for people who develop a health problem that would prevent them from having lung cancer treatment.  If you are pregnant, do not drink alcohol. If you are  breastfeeding, be very cautious about drinking alcohol. If you are not pregnant and choose to drink alcohol, do not have more than 1 drink per day. One drink is considered to be 12 ounces (355 mL) of beer, 5 ounces (148 mL) of wine, or 1.5 ounces (44 mL) of liquor.  Avoid use of street drugs. Do not share needles with anyone. Ask for help if you need support or instructions about stopping the use of drugs.  High blood pressure causes heart disease and increases the risk  of stroke. Your blood pressure should be checked at least every 1 to 2 years. Ongoing high blood pressure should be treated with medicines if weight loss and exercise do not work.  If you are 55-79 years old, ask your health care provider if you should take aspirin to prevent strokes.  Diabetes screening is done by taking a blood sample to check your blood glucose level after you have not eaten for a certain period of time (fasting). If you are not overweight and you do not have risk factors for diabetes, you should be screened once every 3 years starting at age 45. If you are overweight or obese and you are 40-70 years of age, you should be screened for diabetes every year as part of your cardiovascular risk assessment.  Breast cancer screening is essential preventive care for women. You should practice "breast self-awareness." This means understanding the normal appearance and feel of your breasts and may include breast self-examination. Any changes detected, no matter how small, should be reported to a health care provider. Women in their 20s and 30s should have a clinical breast exam (CBE) by a health care provider as part of a regular health exam every 1 to 3 years. After age 40, women should have a CBE every year. Starting at age 40, women should consider having a mammogram (breast X-ray test) every year. Women who have a family history of breast cancer should talk to their health care provider about genetic screening. Women at a high risk of breast cancer should talk to their health care providers about having an MRI and a mammogram every year.  Breast cancer gene (BRCA)-related cancer risk assessment is recommended for women who have family members with BRCA-related cancers. BRCA-related cancers include breast, ovarian, tubal, and peritoneal cancers. Having family members with these cancers may be associated with an increased risk for harmful changes (mutations) in the breast cancer genes BRCA1 and  BRCA2. Results of the assessment will determine the need for genetic counseling and BRCA1 and BRCA2 testing.  Your health care provider may recommend that you be screened regularly for cancer of the pelvic organs (ovaries, uterus, and vagina). This screening involves a pelvic examination, including checking for microscopic changes to the surface of your cervix (Pap test). You may be encouraged to have this screening done every 3 years, beginning at age 21.  For women ages 30-65, health care providers may recommend pelvic exams and Pap testing every 3 years, or they may recommend the Pap and pelvic exam, combined with testing for human papilloma virus (HPV), every 5 years. Some types of HPV increase your risk of cervical cancer. Testing for HPV may also be done on women of any age with unclear Pap test results.  Other health care providers may not recommend any screening for nonpregnant women who are considered low risk for pelvic cancer and who do not have symptoms. Ask your health care provider if a screening pelvic exam is right for   you.  If you have had past treatment for cervical cancer or a condition that could lead to cancer, you need Pap tests and screening for cancer for at least 20 years after your treatment. If Pap tests have been discontinued, your risk factors (such as having a new sexual partner) need to be reassessed to determine if screening should resume. Some women have medical problems that increase the chance of getting cervical cancer. In these cases, your health care provider may recommend more frequent screening and Pap tests.  Colorectal cancer can be detected and often prevented. Most routine colorectal cancer screening begins at the age of 50 years and continues through age 75 years. However, your health care provider may recommend screening at an earlier age if you have risk factors for colon cancer. On a yearly basis, your health care provider may provide home test kits to check  for hidden blood in the stool. Use of a small camera at the end of a tube, to directly examine the colon (sigmoidoscopy or colonoscopy), can detect the earliest forms of colorectal cancer. Talk to your health care provider about this at age 50, when routine screening begins. Direct exam of the colon should be repeated every 5-10 years through age 75 years, unless early forms of precancerous polyps or small growths are found.  People who are at an increased risk for hepatitis B should be screened for this virus. You are considered at high risk for hepatitis B if:  You were born in a country where hepatitis B occurs often. Talk with your health care provider about which countries are considered high risk.  Your parents were born in a high-risk country and you have not received a shot to protect against hepatitis B (hepatitis B vaccine).  You have HIV or AIDS.  You use needles to inject street drugs.  You live with, or have sex with, someone who has hepatitis B.  You get hemodialysis treatment.  You take certain medicines for conditions like cancer, organ transplantation, and autoimmune conditions.  Hepatitis C blood testing is recommended for all people born from 1945 through 1965 and any individual with known risks for hepatitis C.  Practice safe sex. Use condoms and avoid high-risk sexual practices to reduce the spread of sexually transmitted infections (STIs). STIs include gonorrhea, chlamydia, syphilis, trichomonas, herpes, HPV, and human immunodeficiency virus (HIV). Herpes, HIV, and HPV are viral illnesses that have no cure. They can result in disability, cancer, and death.  You should be screened for sexually transmitted illnesses (STIs) including gonorrhea and chlamydia if:  You are sexually active and are younger than 24 years.  You are older than 24 years and your health care provider tells you that you are at risk for this type of infection.  Your sexual activity has changed  since you were last screened and you are at an increased risk for chlamydia or gonorrhea. Ask your health care provider if you are at risk.  If you are at risk of being infected with HIV, it is recommended that you take a prescription medicine daily to prevent HIV infection. This is called preexposure prophylaxis (PrEP). You are considered at risk if:  You are sexually active and do not regularly use condoms or know the HIV status of your partner(s).  You take drugs by injection.  You are sexually active with a partner who has HIV.  Talk with your health care provider about whether you are at high risk of being infected with HIV. If   you choose to begin PrEP, you should first be tested for HIV. You should then be tested every 3 months for as long as you are taking PrEP.  Osteoporosis is a disease in which the bones lose minerals and strength with aging. This can result in serious bone fractures or breaks. The risk of osteoporosis can be identified using a bone density scan. Women ages 67 years and over and women at risk for fractures or osteoporosis should discuss screening with their health care providers. Ask your health care provider whether you should take a calcium supplement or vitamin D to reduce the rate of osteoporosis.  Menopause can be associated with physical symptoms and risks. Hormone replacement therapy is available to decrease symptoms and risks. You should talk to your health care provider about whether hormone replacement therapy is right for you.  Use sunscreen. Apply sunscreen liberally and repeatedly throughout the day. You should seek shade when your shadow is shorter than you. Protect yourself by wearing long sleeves, pants, a wide-brimmed hat, and sunglasses year round, whenever you are outdoors.  Once a month, do a whole body skin exam, using a mirror to look at the skin on your back. Tell your health care provider of new moles, moles that have irregular borders, moles that  are larger than a pencil eraser, or moles that have changed in shape or color.  Stay current with required vaccines (immunizations).  Influenza vaccine. All adults should be immunized every year.  Tetanus, diphtheria, and acellular pertussis (Td, Tdap) vaccine. Pregnant women should receive 1 dose of Tdap vaccine during each pregnancy. The dose should be obtained regardless of the length of time since the last dose. Immunization is preferred during the 27th-36th week of gestation. An adult who has not previously received Tdap or who does not know her vaccine status should receive 1 dose of Tdap. This initial dose should be followed by tetanus and diphtheria toxoids (Td) booster doses every 10 years. Adults with an unknown or incomplete history of completing a 3-dose immunization series with Td-containing vaccines should begin or complete a primary immunization series including a Tdap dose. Adults should receive a Td booster every 10 years.  Varicella vaccine. An adult without evidence of immunity to varicella should receive 2 doses or a second dose if she has previously received 1 dose. Pregnant females who do not have evidence of immunity should receive the first dose after pregnancy. This first dose should be obtained before leaving the health care facility. The second dose should be obtained 4-8 weeks after the first dose.  Human papillomavirus (HPV) vaccine. Females aged 13-26 years who have not received the vaccine previously should obtain the 3-dose series. The vaccine is not recommended for use in pregnant females. However, pregnancy testing is not needed before receiving a dose. If a female is found to be pregnant after receiving a dose, no treatment is needed. In that case, the remaining doses should be delayed until after the pregnancy. Immunization is recommended for any person with an immunocompromised condition through the age of 61 years if she did not get any or all doses earlier. During the  3-dose series, the second dose should be obtained 4-8 weeks after the first dose. The third dose should be obtained 24 weeks after the first dose and 16 weeks after the second dose.  Zoster vaccine. One dose is recommended for adults aged 30 years or older unless certain conditions are present.  Measles, mumps, and rubella (MMR) vaccine. Adults born  before 1957 generally are considered immune to measles and mumps. Adults born in 1957 or later should have 1 or more doses of MMR vaccine unless there is a contraindication to the vaccine or there is laboratory evidence of immunity to each of the three diseases. A routine second dose of MMR vaccine should be obtained at least 28 days after the first dose for students attending postsecondary schools, health care workers, or international travelers. People who received inactivated measles vaccine or an unknown type of measles vaccine during 1963-1967 should receive 2 doses of MMR vaccine. People who received inactivated mumps vaccine or an unknown type of mumps vaccine before 1979 and are at high risk for mumps infection should consider immunization with 2 doses of MMR vaccine. For females of childbearing age, rubella immunity should be determined. If there is no evidence of immunity, females who are not pregnant should be vaccinated. If there is no evidence of immunity, females who are pregnant should delay immunization until after pregnancy. Unvaccinated health care workers born before 1957 who lack laboratory evidence of measles, mumps, or rubella immunity or laboratory confirmation of disease should consider measles and mumps immunization with 2 doses of MMR vaccine or rubella immunization with 1 dose of MMR vaccine.  Pneumococcal 13-valent conjugate (PCV13) vaccine. When indicated, a person who is uncertain of his immunization history and has no record of immunization should receive the PCV13 vaccine. All adults 65 years of age and older should receive this  vaccine. An adult aged 19 years or older who has certain medical conditions and has not been previously immunized should receive 1 dose of PCV13 vaccine. This PCV13 should be followed with a dose of pneumococcal polysaccharide (PPSV23) vaccine. Adults who are at high risk for pneumococcal disease should obtain the PPSV23 vaccine at least 8 weeks after the dose of PCV13 vaccine. Adults older than 50 years of age who have normal immune system function should obtain the PPSV23 vaccine dose at least 1 year after the dose of PCV13 vaccine.  Pneumococcal polysaccharide (PPSV23) vaccine. When PCV13 is also indicated, PCV13 should be obtained first. All adults aged 65 years and older should be immunized. An adult younger than age 65 years who has certain medical conditions should be immunized. Any person who resides in a nursing home or long-term care facility should be immunized. An adult smoker should be immunized. People with an immunocompromised condition and certain other conditions should receive both PCV13 and PPSV23 vaccines. People with human immunodeficiency virus (HIV) infection should be immunized as soon as possible after diagnosis. Immunization during chemotherapy or radiation therapy should be avoided. Routine use of PPSV23 vaccine is not recommended for American Indians, [place] Natives, or people younger than 65 years unless there are medical conditions that require PPSV23 vaccine. When indicated, people who have unknown immunization and have no record of immunization should receive PPSV23 vaccine. One-time revaccination 5 years after the first dose of PPSV23 is recommended for people aged 19-64 years who have chronic kidney failure, nephrotic syndrome, asplenia, or immunocompromised conditions. People who received 1-2 doses of PPSV23 before age 65 years should receive another dose of PPSV23 vaccine at age 65 years or later if at least 5 years have passed since the previous dose. Doses of PPSV23 are not  needed for people immunized with PPSV23 at or after age 65 years.  Meningococcal vaccine. Adults with asplenia or persistent complement component deficiencies should receive 2 doses of quadrivalent meningococcal conjugate (MenACWY-D) vaccine. The doses should be obtained   at least 2 months apart. Microbiologists working with certain meningococcal bacteria, [place] recruits, people at risk during an outbreak, and people who travel to or live in countries with a high rate of meningitis should be immunized. A first-year college student up through age 34 years who is living in a residence hall should receive a dose if she did not receive a dose on or after her 16th birthday. Adults who have certain high-risk conditions should receive one or more doses of vaccine.  Hepatitis A vaccine. Adults who wish to be protected from this disease, have certain high-risk conditions, work with hepatitis A-infected animals, work in hepatitis A research labs, or travel to or work in countries with a high rate of hepatitis A should be immunized. Adults who were previously unvaccinated and who anticipate close contact with an international adoptee during the first 60 days after arrival in the [place] from a country with a high rate of hepatitis A should be immunized.  Hepatitis B vaccine. Adults who wish to be protected from this disease, have certain high-risk conditions, may be exposed to blood or other infectious body fluids, are household contacts or sex partners of hepatitis B positive people, are clients or workers in certain care facilities, or travel to or work in countries with a high rate of hepatitis B should be immunized.  Haemophilus influenzae type b (Hib) vaccine. A previously unvaccinated person with asplenia or sickle cell disease or having a scheduled splenectomy should receive 1 dose of Hib vaccine. Regardless of previous immunization, a recipient of a hematopoietic stem cell transplant should receive a  3-dose series 6-12 months after her successful transplant. Hib vaccine is not recommended for adults with HIV infection. Preventive Services / Frequency Ages 35 to 4 years  Blood pressure check.** / Every 3-5 years.  Lipid and cholesterol check.** / Every 5 years beginning at age 60.  Clinical breast exam.** / Every 3 years for women in their 71s and 10s.  BRCA-related cancer risk assessment.** / For women who have family members with a BRCA-related cancer (breast, ovarian, tubal, or peritoneal cancers).  Pap test.** / Every 2 years from ages 76 through 26. Every 3 years starting at age 61 through age 76 or 93 with a history of 3 consecutive normal Pap tests.  HPV screening.** / Every 3 years from ages 37 through ages 60 to 51 with a history of 3 consecutive normal Pap tests.  Hepatitis C blood test.** / For any individual with known risks for hepatitis C.  Skin self-exam. / Monthly.  Influenza vaccine. / Every year.  Tetanus, diphtheria, and acellular pertussis (Tdap, Td) vaccine.** / Consult your health care provider. Pregnant women should receive 1 dose of Tdap vaccine during each pregnancy. 1 dose of Td every 10 years.  Varicella vaccine.** / Consult your health care provider. Pregnant females who do not have evidence of immunity should receive the first dose after pregnancy.  HPV vaccine. / 3 doses over 6 months, if 93 and younger. The vaccine is not recommended for use in pregnant females. However, pregnancy testing is not needed before receiving a dose.  Measles, mumps, rubella (MMR) vaccine.** / You need at least 1 dose of MMR if you were born in 1957 or later. You may also need a 2nd dose. For females of childbearing age, rubella immunity should be determined. If there is no evidence of immunity, females who are not pregnant should be vaccinated. If there is no evidence of immunity, females who are  pregnant should delay immunization until after pregnancy.  Pneumococcal  13-valent conjugate (PCV13) vaccine.** / Consult your health care provider.  Pneumococcal polysaccharide (PPSV23) vaccine.** / 1 to 2 doses if you smoke cigarettes or if you have certain conditions.  Meningococcal vaccine.** / 1 dose if you are age 68 to 8 years and a Market researcher living in a residence hall, or have one of several medical conditions, you need to get vaccinated against meningococcal disease. You may also need additional booster doses.  Hepatitis A vaccine.** / Consult your health care provider.  Hepatitis B vaccine.** / Consult your health care provider.  Haemophilus influenzae type b (Hib) vaccine.** / Consult your health care provider. Ages 7 to 53 years  Blood pressure check.** / Every year.  Lipid and cholesterol check.** / Every 5 years beginning at age 25 years.  Lung cancer screening. / Every year if you are aged 11-80 years and have a 30-pack-year history of smoking and currently smoke or have quit within the past 15 years. Yearly screening is stopped once you have quit smoking for at least 15 years or develop a health problem that would prevent you from having lung cancer treatment.  Clinical breast exam.** / Every year after age 48 years.  BRCA-related cancer risk assessment.** / For women who have family members with a BRCA-related cancer (breast, ovarian, tubal, or peritoneal cancers).  Mammogram.** / Every year beginning at age 41 years and continuing for as long as you are in good health. Consult with your health care provider.  Pap test.** / Every 3 years starting at age 65 years through age 37 or 70 years with a history of 3 consecutive normal Pap tests.  HPV screening.** / Every 3 years from ages 72 years through ages 60 to 40 years with a history of 3 consecutive normal Pap tests.  Fecal occult blood test (FOBT) of stool. / Every year beginning at age 21 years and continuing until age 5 years. You may not need to do this test if you get  a colonoscopy every 10 years.  Flexible sigmoidoscopy or colonoscopy.** / Every 5 years for a flexible sigmoidoscopy or every 10 years for a colonoscopy beginning at age 35 years and continuing until age 48 years.  Hepatitis C blood test.** / For all people born from [birth year] through 1965 and any individual with known risks for hepatitis C.  Skin self-exam. / Monthly.  Influenza vaccine. / Every year.  Tetanus, diphtheria, and acellular pertussis (Tdap/Td) vaccine.** / Consult your health care provider. Pregnant women should receive 1 dose of Tdap vaccine during each pregnancy. 1 dose of Td every 10 years.  Varicella vaccine.** / Consult your health care provider. Pregnant females who do not have evidence of immunity should receive the first dose after pregnancy.  Zoster vaccine.** / 1 dose for adults aged 30 years or older.  Measles, mumps, rubella (MMR) vaccine.** / You need at least 1 dose of MMR if you were born in 1957 or later. You may also need a second dose. For females of childbearing age, rubella immunity should be determined. If there is no evidence of immunity, females who are not pregnant should be vaccinated. If there is no evidence of immunity, females who are pregnant should delay immunization until after pregnancy.  Pneumococcal 13-valent conjugate (PCV13) vaccine.** / Consult your health care provider.  Pneumococcal polysaccharide (PPSV23) vaccine.** / 1 to 2 doses if you smoke cigarettes or if you have certain conditions.  Meningococcal vaccine.** /  Consult your health care provider.  Hepatitis A vaccine.** / Consult your health care provider.  Hepatitis B vaccine.** / Consult your health care provider.  Haemophilus influenzae type b (Hib) vaccine.** / Consult your health care provider. Ages 64 years and over  Blood pressure check.** / Every year.  Lipid and cholesterol check.** / Every 5 years beginning at age 23 years.  Lung cancer screening. / Every year if you  are aged 16-80 years and have a 30-pack-year history of smoking and currently smoke or have quit within the past 15 years. Yearly screening is stopped once you have quit smoking for at least 15 years or develop a health problem that would prevent you from having lung cancer treatment.  Clinical breast exam.** / Every year after age 74 years.  BRCA-related cancer risk assessment.** / For women who have family members with a BRCA-related cancer (breast, ovarian, tubal, or peritoneal cancers).  Mammogram.** / Every year beginning at age 44 years and continuing for as long as you are in good health. Consult with your health care provider.  Pap test.** / Every 3 years starting at age 58 years through age 22 or 39 years with 3 consecutive normal Pap tests. Testing can be stopped between 65 and 70 years with 3 consecutive normal Pap tests and no abnormal Pap or HPV tests in the past 10 years.  HPV screening.** / Every 3 years from ages 64 years through ages 70 or 61 years with a history of 3 consecutive normal Pap tests. Testing can be stopped between 65 and 70 years with 3 consecutive normal Pap tests and no abnormal Pap or HPV tests in the past 10 years.  Fecal occult blood test (FOBT) of stool. / Every year beginning at age 40 years and continuing until age 27 years. You may not need to do this test if you get a colonoscopy every 10 years.  Flexible sigmoidoscopy or colonoscopy.** / Every 5 years for a flexible sigmoidoscopy or every 10 years for a colonoscopy beginning at age 7 years and continuing until age 32 years.  Hepatitis C blood test.** / For all people born from [birth year] through 1965 and any individual with known risks for hepatitis C.  Osteoporosis screening.** / A one-time screening for women ages 30 years and over and women at risk for fractures or osteoporosis.  Skin self-exam. / Monthly.  Influenza vaccine. / Every year.  Tetanus, diphtheria, and acellular pertussis (Tdap/Td)  vaccine.** / 1 dose of Td every 10 years.  Varicella vaccine.** / Consult your health care provider.  Zoster vaccine.** / 1 dose for adults aged 35 years or older.  Pneumococcal 13-valent conjugate (PCV13) vaccine.** / Consult your health care provider.  Pneumococcal polysaccharide (PPSV23) vaccine.** / 1 dose for all adults aged 46 years and older.  Meningococcal vaccine.** / Consult your health care provider.  Hepatitis A vaccine.** / Consult your health care provider.  Hepatitis B vaccine.** / Consult your health care provider.  Haemophilus influenzae type b (Hib) vaccine.** / Consult your health care provider. ** Family history and personal history of risk and conditions may change your health care provider's recommendations.   This information is not intended to replace advice given to you by your health care provider. Make sure you discuss any questions you have with your health care provider.   Document Released: 03/07/2001 Document Revised: 01/30/2014 Document Reviewed: 06/06/2010 Elsevier Interactive Patient Education [employer].

## 2015-04-21 ENCOUNTER — Encounter: Payer: Self-pay | Admitting: *Deleted

## 2015-04-21 ENCOUNTER — Telehealth: Payer: Self-pay | Admitting: Family Medicine

## 2015-04-21 ENCOUNTER — Other Ambulatory Visit: Payer: Self-pay | Admitting: Family Medicine

## 2015-04-21 NOTE — Telephone Encounter (Signed)
Relation to [id number] Call back number:[phone]   Reason for call:  Patient requesting Dr. Note stating she was seen and assisted spouse Quianna Crankshaw on Wednesday 04/20/15 to his appointment. Patient would like to print letter from My Chart. Please advise

## 2015-04-22 ENCOUNTER — Other Ambulatory Visit: Payer: Self-pay | Admitting: Family Medicine

## 2015-04-22 NOTE — Telephone Encounter (Signed)
Note is in mychart

## 2015-04-23 ENCOUNTER — Other Ambulatory Visit: Payer: Self-pay

## 2015-04-23 ENCOUNTER — Telehealth: Payer: Self-pay | Admitting: *Deleted

## 2015-04-23 ENCOUNTER — Telehealth: Payer: Self-pay | Admitting: Family Medicine

## 2015-04-23 LAB — VITAMIN D 1,25 DIHYDROXY
Vitamin D 1, 25 (OH)2 Total: 84 pg/mL — ABNORMAL HIGH (ref 18–72)
Vitamin D2 1, 25 (OH)2: <8 pg/mL
Vitamin D3 1, 25 (OH)2: 84 pg/mL

## 2015-04-23 MED ORDER — CYANOCOBALAMIN 1000 MCG/ML IJ SOLN: 1000.0000 ug | INTRAMUSCULAR | Status: DC

## 2015-04-23 MED ORDER — "SYRINGE 25G X 5/8"" 3 ML MISC": Status: DC

## 2015-04-23 NOTE — Telephone Encounter (Signed)
Caller name: Self  Can be reached: [phone]   Reason for call: Patient request call back about labs.

## 2015-04-23 NOTE — Telephone Encounter (Signed)
Received FMLA/Disability paperwork, completed as much as possible [pt will need to complete section I and sign prior to faxing]; forwarded to provider/SLS 03/31

## 2015-04-23 NOTE — Telephone Encounter (Signed)
See labs.     KP 

## 2015-04-26 NOTE — Telephone Encounter (Signed)
LMOM with contact name and number for return call RE: pt needs to complete Section 1 & sign Section 3, then will need copy and need to know if pt wants [place] to fax/SLS 04/03

## 2015-05-04 ENCOUNTER — Encounter: Payer: Self-pay | Admitting: Family Medicine

## 2015-05-04 ENCOUNTER — Ambulatory Visit (INDEPENDENT_AMBULATORY_CARE_PROVIDER_SITE_OTHER): Payer: BLUE CROSS/BLUE SHIELD | Admitting: Family Medicine

## 2015-05-04 ENCOUNTER — Other Ambulatory Visit (INDEPENDENT_AMBULATORY_CARE_PROVIDER_SITE_OTHER): Payer: BLUE CROSS/BLUE SHIELD

## 2015-05-04 VITALS — BP 120/72 | HR 87 | Ht 62.0 in | Wt 137.0 lb

## 2015-05-04 DIAGNOSIS — M25562 Pain in left knee: Secondary | ICD-10-CM | POA: Diagnosis not present

## 2015-05-04 DIAGNOSIS — M13162 Monoarthritis, not elsewhere classified, left knee: Secondary | ICD-10-CM

## 2015-05-04 DIAGNOSIS — M1712 Unilateral primary osteoarthritis, left knee: Secondary | ICD-10-CM | POA: Insufficient documentation

## 2015-05-04 MED ORDER — DICLOFENAC SODIUM 2 % TD SOLN: 2.0000 "application " | Freq: Two times a day (BID) | TRANSDERMAL | Status: DC

## 2015-05-04 NOTE — Progress Notes (Signed)
[name] [address] [place], [place] [address] Phone: [phone] Subjective:    I'm seeing this patient by the request  of:  [name], DO  CC: left knee pain  [id number] [name] is a 50 y.o. female coming in with complaint of left knee pain. Has been having pain for quite some time. Describes it as a dull, throbbing aching sensation on the anterior aspect of the knee that seems to be worse with stairs. Does not remember any true injury. Patient has been significant more active since she has had weight loss. Rates severity is 7 out of 10. No nighttime awakening.     Past Medical History  Diagnosis Date  . Allergy   . GERD (gastroesophageal reflux disease)   . Depression   . Anemia   . Anxiety   . Asthma   . Heart murmur    Past Surgical History  Procedure Laterality Date  . Abdominal hysterectomy      fibroids  . Gastric bypass    . Tubal ligation     Social History   Social History  . Marital Status: Married    Spouse Name: N/A  . Number of Children: N/A  . Years of Education: N/A   Occupational History  .      [employer]   Social History Main Topics  . Smoking status: Former Smoker    Quit date: 01/24/1987  . Smokeless tobacco: Never Used  . Alcohol Use: Yes     Comment: occasionally  . Drug Use: No  . Sexual Activity: Not Currently   Other Topics Concern  . None   Social History Narrative   Regular exercise--yes   Allergies  Allergen Reactions  . Codeine Itching  . Penicillins Other (See Comments)    Happened in childhood at 50 years of age, unsure of reaction.   Family History  Problem Relation Age of Onset  . Hypertension Father   . Cancer Father     Prostate  . Depression Sister   . Depression Brother   . Diabetes Maternal Grandmother   . Diabetes Maternal Grandfather   . Liver disease Maternal Grandfather   . Cancer Maternal Grandfather     Prostate  . Diabetes Paternal  Grandmother   . Diabetes Paternal Grandfather   . Cancer Paternal Grandfather     Lung, Prostate  . Stroke Mother     Past medical history, social, surgical and family history all reviewed in electronic medical record.  No pertanent information unless stated regarding to the chief complaint.   Review of Systems: No headache, visual changes, nausea, vomiting, diarrhea, constipation, dizziness, abdominal pain, skin rash, fevers, chills, night sweats, weight loss, swollen lymph nodes, body aches, joint swelling, muscle aches, chest pain, shortness of breath, mood changes.   Objective Blood pressure 120/72, pulse 87, height 5\' 2"  (1.575 m), weight 137 lb (62.143 kg), SpO2 99 %.  General: No apparent distress alert and oriented x3 mood and affect normal, dressed appropriately.  HEENT: Pupils equal, extraocular movements intact  Respiratory: Patient's speak in full sentences and does not appear short of breath  Cardiovascular: No lower extremity edema, non tender, no erythema  Skin: Warm dry intact with no signs of infection or rash on extremities or on axial skeleton.  Abdomen: Soft nontender  Neuro: Cranial nerves II through XII are intact, neurovascularly intact in all extremities with 2+ DTRs and 2+ pulses.  Lymph: No lymphadenopathy of posterior  or anterior cervical chain or axillae bilaterally.  Gait normal with good balance and coordination.  MSK:  Non tender with full range of motion and good stability and symmetric strength and tone of shoulders, elbows, wrist, hip, and ankles bilaterally.  Knee:left Mild lateral tilt of the knee noted. TTP over PF joint.  ROM full in flexion and extension and lower leg rotation. Ligaments with solid consistent endpoints including ACL, PCL, LCL, MCL. Negative Mcmurray's, Apley's, and Thessalonian tests. painful patellar compression. Patellar glide with mild crepitus. Patellar and quadriceps tendons unremarkable. Hamstring and quadriceps strength is  normal.  Contralateral knee unremarkable.   MSK US performed of: left This study was ordered, performed, and interpreted by Charlann Boxer D.O.  Knee: All structures visualized. Anteromedial, anterolateral, posteromedial, and posterolateral menisci unremarkable without tearing, fraying, effusion, or displacement. Patellofemoral joint does have some mild narrowing. Patellar Tendon unremarkable on long and transverse views without effusion. No abnormality of prepatellar bursa. LCL and MCL unremarkable on long and transverse views. No abnormality of origin of medial or lateral head of the gastrocnemius.  IMPRESSION:  Patellofemoral arthritis    Impression and Recommendations:     This case required medical decision making of moderate complexity.      Note: This dictation was prepared with Dragon dictation along with smaller phrase technology. Any transcriptional errors that result from this process are unintentional.

## 2015-05-04 NOTE — Telephone Encounter (Signed)
Pre Visit call completed. 

## 2015-05-04 NOTE — Progress Notes (Signed)
Pre visit review using our clinic review tool, if applicable. No additional management support is needed unless otherwise documented below in the visit note. 

## 2015-05-04 NOTE — Patient Instructions (Signed)
Good to see you.  Ice 20 minutes 2 times daily. Usually after activity and before bed. Exercises 3 times a week.  Good shoes with rigid bottom.  [name], [place] or New balance greater then 700 pennsaid pinkie amount topically 2 times daily as needed.  Vitamin D 4000 IU daily  Iron with 500mg  of vitamin C daily  Biking would be great for activity  See me again in 4 weeks and if in pain then would consider injection, PT etc.

## 2015-05-04 NOTE — Assessment & Plan Note (Signed)
Patellofemoral Syndrome  Reviewed anatomy using anatomical model and how PFS occurs.  Given rehab exercises handout for VMO, hip abductors, core, entire kinetic chain including proprioception exercises including cone touches, step downs, hip elevations and turn outs.  Could benefit from PT, regular exercise, upright biking, and a PFS knee brace to assist with tracking abnormalities. Patient given a brace as well. Topical anti-inflammatories given. Patient will return again and see me in 4 weeks. If worsening symptoms we'll consider injection as well as formal physical therapy and x-ray imaging.

## 2015-05-12 ENCOUNTER — Ambulatory Visit: Payer: BLUE CROSS/BLUE SHIELD | Admitting: Podiatry

## 2015-05-18 ENCOUNTER — Ambulatory Visit: Payer: BLUE CROSS/BLUE SHIELD | Admitting: Podiatry

## 2015-05-25 ENCOUNTER — Telehealth: Payer: Self-pay | Admitting: Family Medicine

## 2015-05-25 DIAGNOSIS — D649 Anemia, unspecified: Secondary | ICD-10-CM

## 2015-05-25 DIAGNOSIS — E538 Deficiency of other specified B group vitamins: Secondary | ICD-10-CM

## 2015-05-25 NOTE — Telephone Encounter (Signed)
Orders are in.     KP 

## 2015-05-25 NOTE — Telephone Encounter (Signed)
Pt called to conf lab appt 5/3 for b12 and iron check. No orders in but lab appt is scheduled. Please call pt if not needed or enter orders. Thanks.

## 2015-05-26 ENCOUNTER — Other Ambulatory Visit (INDEPENDENT_AMBULATORY_CARE_PROVIDER_SITE_OTHER): Payer: BLUE CROSS/BLUE SHIELD

## 2015-05-26 DIAGNOSIS — E538 Deficiency of other specified B group vitamins: Secondary | ICD-10-CM | POA: Diagnosis not present

## 2015-05-26 DIAGNOSIS — D649 Anemia, unspecified: Secondary | ICD-10-CM

## 2015-05-26 LAB — CBC WITH DIFFERENTIAL/PLATELET
BASOS PCT: 0.9 % (ref 0.0–3.0)
Basophils Absolute: 0.1 10*3/uL (ref 0.0–0.1)
EOS PCT: 3.9 % (ref 0.0–5.0)
Eosinophils Absolute: 0.2 10*3/uL (ref 0.0–0.7)
HCT: 35.3 % — ABNORMAL LOW (ref 36.0–46.0)
HEMOGLOBIN: 11.6 g/dL — AB (ref 12.0–15.0)
Lymphocytes Relative: 32.9 % (ref 12.0–46.0)
Lymphs Abs: 1.9 10*3/uL (ref 0.7–4.0)
MCHC: 32.8 g/dL (ref 30.0–36.0)
MCV: 81.9 fl (ref 78.0–100.0)
MONO ABS: 0.4 10*3/uL (ref 0.1–1.0)
Monocytes Relative: 6.8 % (ref 3.0–12.0)
Neutro Abs: 3.2 10*3/uL (ref 1.4–7.7)
Neutrophils Relative %: 55.5 % (ref 43.0–77.0)
Platelets: 237 10*3/uL (ref 150.0–400.0)
RBC: 4.31 Mil/uL (ref 3.87–5.11)
RDW: 16.5 % — AB (ref 11.5–15.5)
WBC: 5.8 10*3/uL (ref 4.0–10.5)

## 2015-05-26 LAB — VITAMIN B12: Vitamin B-12: 454 pg/mL (ref 211–911)

## 2015-06-02 ENCOUNTER — Ambulatory Visit (INDEPENDENT_AMBULATORY_CARE_PROVIDER_SITE_OTHER)
Admission: RE | Admit: 2015-06-02 | Discharge: 2015-06-02 | Disposition: A | Discharge status: Home / Self Care | Payer: BLUE CROSS/BLUE SHIELD | Source: Ambulatory Visit | Attending: Family Medicine | Admitting: Family Medicine

## 2015-06-02 ENCOUNTER — Ambulatory Visit (INDEPENDENT_AMBULATORY_CARE_PROVIDER_SITE_OTHER): Payer: BLUE CROSS/BLUE SHIELD | Admitting: Family Medicine

## 2015-06-02 ENCOUNTER — Encounter: Payer: Self-pay | Admitting: Family Medicine

## 2015-06-02 VITALS — BP 120/76 | HR 78 | Wt 139.0 lb

## 2015-06-02 DIAGNOSIS — M1711 Unilateral primary osteoarthritis, right knee: Secondary | ICD-10-CM | POA: Insufficient documentation

## 2015-06-02 DIAGNOSIS — M170 Bilateral primary osteoarthritis of knee: Secondary | ICD-10-CM | POA: Diagnosis not present

## 2015-06-02 DIAGNOSIS — M25562 Pain in left knee: Secondary | ICD-10-CM

## 2015-06-02 NOTE — Progress Notes (Signed)
[name] [address] [place], [place] [address] Phone: [phone] Subjective:    I'm seeing this patient by the request  of:  [name], DO  CC: left knee pain, Worsening right knee pain  [id number] [name] is a 50 y.o. female coming in with complaint of left knee pain. Patient is now having a bilaterally. States that the right knee is worse than previous exam of the left knee is somewhat better. As long she wears the brace at work she seems to do relatively well. Still standing greater than 10 hours a day. Patient states that still having a grinding sensation on the anterior aspect of the knee. Not having any significant instability. States that the swelling may be a little improved.    Past Medical History  Diagnosis Date  . Allergy   . GERD (gastroesophageal reflux disease)   . Depression   . Anemia   . Anxiety   . Asthma   . Heart murmur    Past Surgical History  Procedure Laterality Date  . Abdominal hysterectomy      fibroids  . Gastric bypass    . Tubal ligation     Social History   Social History  . Marital Status: Married    Spouse Name: N/A  . Number of Children: N/A  . Years of Education: N/A   Occupational History  .      [employer]   Social History Main Topics  . Smoking status: Former Smoker    Quit date: 01/24/1987  . Smokeless tobacco: Never Used  . Alcohol Use: Yes     Comment: occasionally  . Drug Use: No  . Sexual Activity: Not Currently   Other Topics Concern  . None   Social History Narrative   Regular exercise--yes   Allergies  Allergen Reactions  . Codeine Itching  . Penicillins Other (See Comments)    Happened in childhood at 50 years of age, unsure of reaction.   Family History  Problem Relation Age of Onset  . Hypertension Father   . Cancer Father     Prostate  . Depression Sister   . Depression Brother   . Diabetes Maternal Grandmother   . Diabetes Maternal  Grandfather   . Liver disease Maternal Grandfather   . Cancer Maternal Grandfather     Prostate  . Diabetes Paternal Grandmother   . Diabetes Paternal Grandfather   . Cancer Paternal Grandfather     Lung, Prostate  . Stroke Mother     Past medical history, social, surgical and family history all reviewed in electronic medical record.  No pertanent information unless stated regarding to the chief complaint.   Review of Systems: No headache, visual changes, nausea, vomiting, diarrhea, constipation, dizziness, abdominal pain, skin rash, fevers, chills, night sweats, weight loss, swollen lymph nodes, body aches, joint swelling, muscle aches, chest pain, shortness of breath, mood changes.   Objective Blood pressure 120/76, pulse 78, weight 139 lb (63.05 kg).  General: No apparent distress alert and oriented x3 mood and affect normal, dressed appropriately.  HEENT: Pupils equal, extraocular movements intact  Respiratory: Patient's speak in full sentences and does not appear short of breath  Cardiovascular: No lower extremity edema, non tender, no erythema  Skin: Warm dry intact with no signs of infection or rash on extremities or on axial skeleton.  Abdomen: Soft nontender  Neuro: Cranial nerves II through XII are intact, neurovascularly intact in all extremities with  2+ DTRs and 2+ pulses.  Lymph: No lymphadenopathy of posterior or anterior cervical chain or axillae bilaterally.  Gait normal with good balance and coordination.  MSK:  Non tender with full range of motion and good stability and symmetric strength and tone of shoulders, elbows, wrist, hip, and ankles bilaterally.  Knee:Bilateral Mild lateral tilt of the knee noted. Continued tenderness over the patellofemoral joint bilaterally ROM full in flexion and extension and lower leg rotation. Ligaments with solid consistent endpoints including ACL, PCL, LCL, MCL. Negative Mcmurray's, Apley's, and Thessalonian tests. painful patellar  compression. Patellar glide with mild to moderate crepitus. Patellar and quadriceps tendons unremarkable. Hamstring and quadriceps strength is normal.   After informed written and verbal consent, patient was seated on exam table. Right knee was prepped with alcohol swab and utilizing anterolateral approach, patient's right knee space was injected with 4:1  marcaine 0.5%: Kenalog 40mg /dL. Patient tolerated the procedure well without immediate complications.  After informed written and verbal consent, patient was seated on exam table. Left knee was prepped with alcohol swab and utilizing anterolateral approach, patient's left knee space was injected with 4:1  marcaine 0.5%: Kenalog 40mg /dL. Patient tolerated the procedure well without immediate complications.    Impression and Recommendations:     This case required medical decision making of moderate complexity.      Note: This dictation was prepared with Dragon dictation along with smaller phrase technology. Any transcriptional errors that result from this process are unintentional.

## 2015-06-02 NOTE — Patient Instructions (Signed)
Good to see you  Xray downstairs today  Ice 20 minutes 2 times daily. Usually after activity and before bed. Continue the pennsaid if it is helpful Continue the exercises See me again in 4-6 weeks.

## 2015-06-02 NOTE — Assessment & Plan Note (Signed)
Worsening symptoms. Patient was offered formal physical therapy which she declined. Patient is unable to do oral anti-inflammatories secondary to history of gastric bypass. Decided to do injections today. X-rays are pending. Encourage patient to continue home exercises. We'll continue to wear bracing as it is helpful. We discussed the icing regimen again in proper shoes. Follow-up again in 4-6 weeks for further evaluation and treatment.  Spent  25 minutes with patient face-to-face and had greater than 50% of counseling including as described above in assessment and plan.

## 2015-06-03 ENCOUNTER — Ambulatory Visit: Payer: BLUE CROSS/BLUE SHIELD | Admitting: Podiatry

## 2015-06-14 ENCOUNTER — Ambulatory Visit (INDEPENDENT_AMBULATORY_CARE_PROVIDER_SITE_OTHER): Payer: BLUE CROSS/BLUE SHIELD | Admitting: Podiatry

## 2015-06-14 ENCOUNTER — Encounter: Payer: Self-pay | Admitting: Podiatry

## 2015-06-14 ENCOUNTER — Ambulatory Visit (INDEPENDENT_AMBULATORY_CARE_PROVIDER_SITE_OTHER): Payer: BLUE CROSS/BLUE SHIELD

## 2015-06-14 VITALS — BP 120/75 | HR 74 | Resp 16

## 2015-06-14 DIAGNOSIS — M2042 Other hammer toe(s) (acquired), left foot: Secondary | ICD-10-CM

## 2015-06-14 NOTE — Progress Notes (Signed)
   Subjective:    Patient ID: [name], female    DOB: 03-Apr-1965, 50 y.o.   MRN: [id number]  HPI    Review of Systems  All other systems reviewed and are negative.      Objective:   Physical Exam        Assessment & Plan:

## 2015-06-16 ENCOUNTER — Other Ambulatory Visit: Payer: Self-pay | Admitting: Family Medicine

## 2015-06-16 NOTE — Progress Notes (Signed)
Subjective:     Patient ID: [name], female   DOB: Apr 20, 1965, 50 y.o.   MRN: [id number]  HPI patient states that she stumped her toes and she's been getting pain in her second digit left with an abnormal nail fifth left and ingrown toenail right big toe. States that it bothers her more as time goes on and makes it difficult to wear shoe gear comfortably    Review of Systems  All other systems reviewed and are negative.      Objective:   Physical Exam  Constitutional: She is oriented to person, place, and time.  Cardiovascular: Intact distal pulses.   Musculoskeletal: Normal range of motion.  Neurological: She is oriented to person, place, and time.  Skin: Skin is warm.  Nursing note and vitals reviewed.  neurovascular status intact muscle strength adequate range of motion within normal limits with patient found to have incurvated medial border right hallux second digit left and thick and fifth nail left with pain. Patient's found to have good digital perfusion is well oriented 3 with no equinus condition and has mild structural changes of the second digit left     Assessment:     Ingrown toenail deformity by 3 with pain and structural deformity second digit left    Plan:     H&P conditions reviewed and recommended at one point removing the nail corners and the fifth nail left. Patient wants this done but wants to wait for Thursday and she is scheduled for procedures and today I discussed trimming techniques to reduce pressure

## 2015-06-16 NOTE — Telephone Encounter (Signed)
Dr Etter Sjogren-- current B12 request states to take 1075mcg once a week for 4 weeks and was dated 04/23/15. Pt is requesting refill. Should it be once a month now or how should pt proceed with injection?

## 2015-06-24 ENCOUNTER — Ambulatory Visit (INDEPENDENT_AMBULATORY_CARE_PROVIDER_SITE_OTHER): Payer: BLUE CROSS/BLUE SHIELD | Admitting: Podiatry

## 2015-06-24 DIAGNOSIS — L600 Ingrowing nail: Secondary | ICD-10-CM

## 2015-06-24 NOTE — Patient Instructions (Signed)

## 2015-06-25 NOTE — Progress Notes (Signed)
Subjective:     Patient ID: [name], female   DOB: [birth year], 50 y.o.   MRN: [id number]  HPI patient presents stating I am ready to get these nails taken off and points to the medial border right hallux medial border left second toe and the fifth nail. 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 I explained procedures and risk and patient wants surgery and today I infiltrated with 120 mg Xylocaine Marcaine mixture and under sterile conditions remove the medial border right hallux and the medial border left second nail and exposed matrix and applied phenol 3 applications 30 seconds and then remove the fifth nail left and applied phenol for applications 30 seconds followed by alcohol lavage. Sterile dressings applied and gave instructions on soaks and reappoint

## 2015-06-29 ENCOUNTER — Telehealth: Payer: Self-pay | Admitting: *Deleted

## 2015-06-29 NOTE — Telephone Encounter (Addendum)
Pt states her employer requires she wear enclosed shoes to work and her toes are swollen and painful after the procedure on 06/24/2015.  Dr. Paulla Dolly states write pt out of work one week, switch to epsom salt soaks bid for the week out of work then shower cleanse the toes when begin to go back to work, if not able to return to enclosed shoes make an appt to be seen.  I informed pt of Dr. Mellody Drown orders, and to epsom salt soaks bid for 7 days and cover with her Polysporin ointment and bandaid, when going to work - shower as usual, befor getting out of the shower, wet a clean washcloth and squeeze liquid antibacterial soap on it and gently rub over toe and rinse well and cover with antibacterial dressing, may allow to air dry when seated or resting at about 3-4 weeks, until area gets a dry hard scab.  Pt states understanding, would like out of work letters faxed to [facility] - [phone], and to employer [employer] [id number] [phone]. 06/30/2015-Faxed Dr. Mellody Drown letter to [facility] with note to contact Gretta Arab, RN for disability form completion.  Faxed Dr. Mellody Drown letter to pt's employer - [employer] with note to contact Gretta Arab, RN for disability form completion. 07/02/2015-Pt states she has information and an appt.  I left message that I would call Monday for the information. 07/05/2015-Pt states her toe is more red now and feels stiff when she puts it down.  I transferred her to schedulers.

## 2015-06-30 ENCOUNTER — Encounter: Payer: Self-pay | Admitting: *Deleted

## 2015-07-05 ENCOUNTER — Ambulatory Visit (INDEPENDENT_AMBULATORY_CARE_PROVIDER_SITE_OTHER): Payer: BLUE CROSS/BLUE SHIELD | Admitting: Sports Medicine

## 2015-07-05 ENCOUNTER — Ambulatory Visit (INDEPENDENT_AMBULATORY_CARE_PROVIDER_SITE_OTHER): Payer: BLUE CROSS/BLUE SHIELD

## 2015-07-05 ENCOUNTER — Encounter: Payer: Self-pay | Admitting: Sports Medicine

## 2015-07-05 DIAGNOSIS — M25872 Other specified joint disorders, left ankle and foot: Secondary | ICD-10-CM

## 2015-07-05 DIAGNOSIS — M79672 Pain in left foot: Secondary | ICD-10-CM | POA: Diagnosis not present

## 2015-07-05 DIAGNOSIS — S90122A Contusion of left lesser toe(s) without damage to nail, initial encounter: Secondary | ICD-10-CM

## 2015-07-05 DIAGNOSIS — M2042 Other hammer toe(s) (acquired), left foot: Secondary | ICD-10-CM

## 2015-07-05 DIAGNOSIS — Z9889 Other specified postprocedural states: Secondary | ICD-10-CM

## 2015-07-05 MED ORDER — NEOMYCIN-POLYMYXIN-HC 3.5-10000-1 OT SOLN: OTIC | Status: DC

## 2015-07-05 NOTE — Progress Notes (Signed)
Patient ID: [name], female   DOB: 1965/11/01, 50 y.o.   MRN: [id number]   Subjective: [name] is a 50 y.o. female patient returns to office today for follow up evaluation after having Right medial hallux, Left 5th toe and Left 2nd medial permanent nail avulsions performed on 06-24-15 Patient has been soaking using epsom salt. Reports that her toe is tender and is bruised and states that her toe is rising up more and she is having difficulty fitting shoes because of the pain at the left 2nd toe, Admits to a little bit of drainage that is better after soaking with Epsom. Admits all other procedure sites are fine, Patient deniesfever/chills/nausea/vomitting/any other related constitutional symptoms at this time.  Patient Active Problem List   Diagnosis Date Noted  . Degenerative arthritis of knee, bilateral 06/02/2015  . Patellofemoral arthritis of left knee 05/04/2015  . Cough 09/02/2013  . Acute pharyngitis 09/02/2013  . Sinusitis 06/25/2013  . Dog bite of face 04/16/2013  . Gastroenteritis 04/16/2013  . Allergic rhinitis 04/16/2013  . Mass of soft tissue 08/02/2012  . RESTLESS LEGS SYNDROME 01/21/2009  . LYMPHOPENIA 03/23/2008  . DEPRESSION 02/27/2008  . HEMANGIOMA, SKIN 02/04/2008  . 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 ALPRAZolam (XANAX) 1 MG tablet     . B-D 3CC LUER-LOK SYR 25GX5/8" 25G X 5/8" 3 ML MISC USE AS DIRECTED WITH B-12 INJECTION 16 each 3  . CARAFATE 1 GM/10ML suspension 10 mls by mouth twice daily  0  . Cholecalciferol (VITAMIN D) 2000 UNITS CAPS Take 1 capsule by mouth daily. Reported on 05/04/2015    . cyanocobalamin (,VITAMIN B-12,) 1000 MCG/ML injection INJECT 1 ML INTO THE MUSCLE ONCE A WEEK. FOR 4 WEEKS 4 mL 0  . cyclobenzaprine (FLEXERIL) 10 MG tablet Take 1 tablet (10 mg total) by mouth 3 (three) times daily as needed. 30 tablet 2  . dextroamphetamine  (DEXTROSTAT) 10 MG tablet Take 10 mg by mouth 3 (three) times daily.    . Diclofenac Sodium (PENNSAID) 2 % SOLN Place 2 application onto the skin 2 (two) times daily. 112 g 3  . Fe Cbn-Fe Gluc-FA-B12-C-DSS (FERRALET 90) 90-1 MG TABS TAKE 1 TABLET BY MOUTH EVERY DAY 30 each 11  . fluticasone (FLONASE) 50 MCG/ACT nasal spray Place 2 sprays into both nostrils daily. 16 g 11  . loperamide (ANTI-DIARRHEAL) 2 MG capsule Take 2 mg by mouth 3 (three) times daily.    . pantoprazole (PROTONIX) 20 MG tablet Take 20 mg by mouth daily.  3  . rOPINIRole (REQUIP) 1 MG tablet TAKE 1 TABLET (1 MG TOTAL) BY MOUTH AT BEDTIME. 90 tablet 3  . sertraline (ZOLOFT) 100 MG tablet Take 3 tablets by mouth daily.    Marland Kitchen trimethoprim (TRIMPEX) 100 MG tablet TAKE 1 TABLET (100 MG TOTAL) BY MOUTH 2 (TWO) TIMES DAILY. 180 tablet 3  . vitamin B-12 (CYANOCOBALAMIN) 1000 MCG tablet Take 1,000 mcg by mouth daily.      . [DISCONTINUED] Azelastine-Fluticasone (DYMISTA) 137-50 MCG/ACT SUSP Place 1 spray into the nose 2 (two) times daily.    . [DISCONTINUED] loratadine (CLARITIN) 10 MG tablet Take 10 mg by mouth daily.      . [DISCONTINUED] montelukast (SINGULAIR) 10 MG tablet Take 10 mg by mouth at bedtime.      . [DISCONTINUED] RABEprazole (ACIPHEX) 20 MG tablet Take 20 mg by mouth  daily.      . [DISCONTINUED] traZODone (DESYREL) 100 MG tablet Take 100 mg by mouth at bedtime.       No current facility-administered medications on file prior to visit.    Allergies  Allergen Reactions  . Codeine Itching  . Penicillins Other (See Comments)    Happened in childhood at 50 years of age, unsure of reaction.    Objective:  General: Well developed, nourished, in no acute distress, alert and oriented x3   Dermatology: Skin is warm, dry and supple bilateral. Right hallux medial, left 2nd toe medial, left 5th toe nailbed appears to be clean, dry, with mild granular tissue and surrounding eschar/scab, There is mild bruising and tenderness  proximal to left 2nd toe nail bed. (-) Erythema. (-) Edema. (-) serosanguous drainage present. The remaining nails appear unremarkable at this time. There are no other lesions or other signs of infection present.  Neurovascular status: Intact. No lower extremity swelling; No pain with calf compression bilateral.  Musculoskeletal: Mild tenderness to palpation of the Left 2nd toe medial nail fold. No pain with palpation to other procedure sites. There is early hammertoe with medial deviation to left 2nd toe and mild tenderness to 2nd MTPJ on left that is a secondary concern for patient. Muscular strength within normal limits bilateral.   Xrays Left foot; Normal osseous mineralization, Mild 2nd toe medial deviation at level of MTPJ, no other acute findings.   Assesement and Plan: Problem List Items Addressed This Visit    None    Visit Diagnoses    S/P nail surgery    -  Primary    Relevant Medications    neomycin-polymyxin-hydrocortisone (CORTISPORIN) otic solution    Left foot pain        Relevant Medications    neomycin-polymyxin-hydrocortisone (CORTISPORIN) otic solution    Other Relevant Orders    DG Foot 2 Views Left    Hammertoe, left        2nd toe with medial deviation     Predislocation syndrome of metatarsophalangeal joint, left        Bruise of toe, left, initial encounter           -Examined patient  -Xrays reviewed -Cleansed nail folds and gently scrubbed with peroxide and curetted away eschar at sites and applied antibiotic cream covered with bandaid.  -Discussed plan of care with patient. -Patient to now begin using Cortisporin and continue soaking in a weak solution of Epsom salt and warm water. Patient was instructed to soak for 15-20 minutes each day until the toe appears normal and there is no more bruising, drainage, or tenderness at the procedure site, and apply Cortisporin and a gauze or bandaid dressing each day as needed. May leave open to air at  night. -Educated patient on long term care after nail surgery. -Patient was instructed to monitor the toe for reoccurrence and signs of infection; Patient advised to return to office or go to ER if toe becomes red, hot or swollen. 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 At this time healing of  Left 2nd toe and return to work status will be re-assessed.   Landis Martins, DPM

## 2015-07-07 ENCOUNTER — Ambulatory Visit: Payer: BLUE CROSS/BLUE SHIELD | Admitting: Family Medicine

## 2015-07-07 DIAGNOSIS — Z0289 Encounter for other administrative examinations: Secondary | ICD-10-CM

## 2015-07-19 ENCOUNTER — Encounter: Payer: Self-pay | Admitting: Podiatry

## 2015-07-19 ENCOUNTER — Ambulatory Visit (INDEPENDENT_AMBULATORY_CARE_PROVIDER_SITE_OTHER): Payer: BLUE CROSS/BLUE SHIELD | Admitting: Podiatry

## 2015-07-19 DIAGNOSIS — M2042 Other hammer toe(s) (acquired), left foot: Secondary | ICD-10-CM | POA: Diagnosis not present

## 2015-07-19 DIAGNOSIS — L600 Ingrowing nail: Secondary | ICD-10-CM

## 2015-07-19 DIAGNOSIS — M779 Enthesopathy, unspecified: Secondary | ICD-10-CM

## 2015-07-19 MED ORDER — TRIAMCINOLONE ACETONIDE 10 MG/ML IJ SUSP
10.0000 mg | Freq: Once | INTRAMUSCULAR | Status: AC
  Administered 2015-07-19: 10 mg

## 2015-07-21 NOTE — Progress Notes (Signed)
Subjective:     Patient ID: [name], female   DOB: 12-Apr-1965, 50 y.o.   MRN: [id number]  HPI patient presents with pain in the left foot with inflammatory changes and also well-healed nail sites bilateral   Review of Systems     Objective:   Physical Exam Neurovascular status intact muscle strength doing well with crusted nailbeds indicating good healing with no proximal edema erythema drainage noted. Patient's develop discomfort around the second MPJ left with fluid buildup    Assessment:     Inflammatory capsulitis second MPJ left with pain with well-healed nail sites bilateral    Plan:     H&P condition reviewed and today I went ahead and I did proximal nerve block of the left. I then aspirated the second MPJ getting out of small amount of clear fluid and I injected with half cc dexamethasone Kenalog and advised on reduced activity and applied padding to take pressure off the area  X-ray indicates no indications of stress fracture or arthritic process

## 2015-08-09 ENCOUNTER — Ambulatory Visit: Payer: BLUE CROSS/BLUE SHIELD | Admitting: Podiatry

## 2015-08-23 ENCOUNTER — Encounter: Payer: Self-pay | Admitting: Podiatry

## 2015-08-23 ENCOUNTER — Ambulatory Visit (INDEPENDENT_AMBULATORY_CARE_PROVIDER_SITE_OTHER): Payer: BLUE CROSS/BLUE SHIELD | Admitting: Podiatry

## 2015-08-23 DIAGNOSIS — M779 Enthesopathy, unspecified: Secondary | ICD-10-CM | POA: Diagnosis not present

## 2015-08-23 DIAGNOSIS — M2042 Other hammer toe(s) (acquired), left foot: Secondary | ICD-10-CM | POA: Diagnosis not present

## 2015-08-24 NOTE — Progress Notes (Signed)
Subjective:     Patient ID: [name], female   DOB: 07/04/1965, 50 y.o.   MRN: [id number]  HPI patient presents stating I'm still having problems with this left foot in this lesion of the foot becomes quite tender   Review of Systems     Objective:   Physical Exam Neurovascular status intact muscle strength adequate with inflammation pain underneath the second metatarsal left with fluid buildup around the joint and thickness of the keratotic lesion formation    Assessment:     Inflammatory changes with digital deformity second left with fluid buildup around second MPJ and mild increase in skin thickness left second    Plan:     Reviewed condition at great length and at this point I recommended orthotics to disperse pressure off the joint surface. Patient wants to have them made and is scanned for custom orthotics today

## 2015-09-03 ENCOUNTER — Other Ambulatory Visit: Payer: Self-pay | Admitting: Gynecology

## 2015-09-03 DIAGNOSIS — Z1231 Encounter for screening mammogram for malignant neoplasm of breast: Secondary | ICD-10-CM

## 2015-09-15 ENCOUNTER — Ambulatory Visit: Payer: BLUE CROSS/BLUE SHIELD | Admitting: *Deleted

## 2015-09-15 DIAGNOSIS — M779 Enthesopathy, unspecified: Secondary | ICD-10-CM

## 2015-09-15 NOTE — Patient Instructions (Signed)

## 2015-09-15 NOTE — Progress Notes (Signed)
Patient ID: [name], female   DOB: 06-02-1965, 50 y.o.   MRN: [id number]  Patient presents for orthotic pick up.  Verbal and written break in and wear instructions given.  Patient will follow up in 4 weeks if symptoms worsen or fail to improve.

## 2015-10-11 ENCOUNTER — Ambulatory Visit: Payer: BLUE CROSS/BLUE SHIELD

## 2015-10-14 ENCOUNTER — Ambulatory Visit
Admission: RE | Admit: 2015-10-14 | Discharge: 2015-10-14 | Disposition: A | Discharge status: Home / Self Care | Payer: BLUE CROSS/BLUE SHIELD | Source: Ambulatory Visit | Attending: Gynecology | Admitting: Gynecology

## 2015-10-14 DIAGNOSIS — Z1231 Encounter for screening mammogram for malignant neoplasm of breast: Secondary | ICD-10-CM

## 2015-10-15 ENCOUNTER — Ambulatory Visit: Payer: BLUE CROSS/BLUE SHIELD | Admitting: Family Medicine

## 2015-10-15 ENCOUNTER — Encounter: Payer: Self-pay | Admitting: Family Medicine

## 2015-10-15 ENCOUNTER — Ambulatory Visit (INDEPENDENT_AMBULATORY_CARE_PROVIDER_SITE_OTHER): Payer: BLUE CROSS/BLUE SHIELD | Admitting: Family Medicine

## 2015-10-15 VITALS — BP 122/74 | HR 76 | Temp 98.3°F | Wt 136.0 lb

## 2015-10-15 DIAGNOSIS — Z23 Encounter for immunization: Secondary | ICD-10-CM | POA: Diagnosis not present

## 2015-10-15 DIAGNOSIS — M25551 Pain in right hip: Secondary | ICD-10-CM | POA: Diagnosis not present

## 2015-10-15 DIAGNOSIS — E538 Deficiency of other specified B group vitamins: Secondary | ICD-10-CM

## 2015-10-15 MED ORDER — CYANOCOBALAMIN 1000 MCG/ML IJ SOLN: INTRAMUSCULAR | 3 refills | Status: DC

## 2015-10-15 MED ORDER — "SYRINGE/NEEDLE (DISP) 25G X 5/8"" 3 ML MISC": 3 refills | Status: DC

## 2015-10-15 NOTE — Patient Instructions (Signed)

## 2015-10-15 NOTE — Progress Notes (Signed)
Pre visit review using our clinic review tool, if applicable. No additional management support is needed unless otherwise documented below in the visit note. 

## 2015-10-15 NOTE — Progress Notes (Signed)
Patient ID: [name], female    DOB: 07/19/1965  Age: 50 y.o. MRN: [id number]    Subjective:  Subjective  HPI [name] presents for R hip pain after fall 3 weeks ago.  She fell while playing with her dogs.  She ached for 2-3 days after fall but then it was just her hip and noticed with leg lifts the hip pops and hurts --- it is just not getting better.  Pt taking extra str tylenol and her flexeril with little relief  Review of Systems  Constitutional: Negative for appetite change, diaphoresis, fatigue and unexpected weight change.  Eyes: Negative for pain, redness and visual disturbance.  Respiratory: Negative for cough, chest tightness, shortness of breath and wheezing.   Cardiovascular: Negative for chest pain, palpitations and leg swelling.  Endocrine: Negative for cold intolerance, heat intolerance, polydipsia, polyphagia and polyuria.  Genitourinary: Negative for difficulty urinating, dysuria and frequency.  Musculoskeletal: Positive for back pain. Negative for myalgias.  Neurological: Negative for dizziness, light-headedness, numbness and headaches.    History Past Medical History:  Diagnosis Date  . Allergy   . Anemia   . Anxiety   . Asthma   . Depression   . GERD (gastroesophageal reflux disease)   . Heart murmur     She has a past surgical history that includes Abdominal hysterectomy; Gastric bypass; and Tubal ligation.   Her family history includes Cancer in her father, maternal grandfather, and paternal grandfather; Depression in her brother and sister; Diabetes in her maternal grandfather, maternal grandmother, paternal grandfather, and paternal grandmother; Hypertension in her father; Liver disease in her maternal grandfather; Stroke in her mother.She reports that she quit smoking about 28 years ago. She has never used smokeless tobacco. She reports that she drinks alcohol. She reports that she does not use drugs.  Current Outpatient Prescriptions on File  Prior to Visit  Medication Sig Dispense Refill  . ALPRAZolam (XANAX) 1 MG tablet     . CARAFATE 1 GM/10ML suspension 10 mls by mouth twice daily  0  . Cholecalciferol (VITAMIN D) 2000 UNITS CAPS Take 1 capsule by mouth daily. Reported on 05/04/2015    . cyclobenzaprine (FLEXERIL) 10 MG tablet Take 1 tablet (10 mg total) by mouth 3 (three) times daily as needed. 30 tablet 2  . dextroamphetamine (DEXTROSTAT) 10 MG tablet Take 10 mg by mouth 3 (three) times daily.    . Diclofenac Sodium (PENNSAID) 2 % SOLN Place 2 application onto the skin 2 (two) times daily. 112 g 3  . Fe Cbn-Fe Gluc-FA-B12-C-DSS (FERRALET 90) 90-1 MG TABS TAKE 1 TABLET BY MOUTH EVERY DAY 30 each 11  . fluticasone (FLONASE) 50 MCG/ACT nasal spray Place 2 sprays into both nostrils daily. 16 g 11  . loperamide (ANTI-DIARRHEAL) 2 MG capsule Take 2 mg by mouth 3 (three) times daily.    Marland Kitchen neomycin-polymyxin-hydrocortisone (CORTISPORIN) otic solution 2-3 drops to left 2nd toe 10 mL 2  . pantoprazole (PROTONIX) 20 MG tablet Take 20 mg by mouth daily.  3  . rOPINIRole (REQUIP) 1 MG tablet TAKE 1 TABLET (1 MG TOTAL) BY MOUTH AT BEDTIME. 90 tablet 3  . sertraline (ZOLOFT) 100 MG tablet Take 3 tablets by mouth daily.    Marland Kitchen trimethoprim (TRIMPEX) 100 MG tablet TAKE 1 TABLET (100 MG TOTAL) BY MOUTH 2 (TWO) TIMES DAILY. 180 tablet 3  . vitamin B-12 (CYANOCOBALAMIN) 1000 MCG tablet Take 1,000 mcg by mouth daily.      . [DISCONTINUED] Azelastine-Fluticasone (DYMISTA) 137-50 MCG/ACT  SUSP Place 1 spray into the nose 2 (two) times daily.    . [DISCONTINUED] loratadine (CLARITIN) 10 MG tablet Take 10 mg by mouth daily.      . [DISCONTINUED] montelukast (SINGULAIR) 10 MG tablet Take 10 mg by mouth at bedtime.      . [DISCONTINUED] RABEprazole (ACIPHEX) 20 MG tablet Take 20 mg by mouth daily.      . [DISCONTINUED] traZODone (DESYREL) 100 MG tablet Take 100 mg by mouth at bedtime.       No current facility-administered medications on file prior to  visit.      Objective:  Objective  Physical Exam  Constitutional: She is oriented to person, place, and time. She appears well-developed and well-nourished.  HENT:  Head: Normocephalic and atraumatic.  Eyes: Conjunctivae and EOM are normal.  Neck: Normal range of motion. Neck supple. No JVD present. Carotid bruit is not present. No thyromegaly present.  Cardiovascular: Normal rate, regular rhythm and normal heart sounds.   No murmur heard. Pulmonary/Chest: Effort normal and breath sounds normal. No respiratory distress. She has no wheezes. She has no rales. She exhibits no tenderness.  Musculoskeletal: She exhibits tenderness. She exhibits no edema.       Right hip: She exhibits tenderness and crepitus.       Legs: Neurological: She is alert and oriented to person, place, and time.  Psychiatric: She has a normal mood and affect. Her behavior is normal. Thought content normal.  Nursing note and vitals reviewed.  BP 122/74 (BP Location: Left Arm, Patient Position: Sitting, Cuff Size: Normal)   Pulse 76   Temp 98.3 F (36.8 C) (Oral)   Wt 136 lb (61.7 kg)   SpO2 99%   BMI 24.87 kg/m  Wt Readings from Last 3 Encounters:  10/15/15 136 lb (61.7 kg)  06/02/15 139 lb (63 kg)  05/04/15 137 lb (62.1 kg)     Lab Results  Component Value Date   WBC 5.8 05/26/2015   HGB 11.6 (L) 05/26/2015   HCT 35.3 (L) 05/26/2015   PLT 237.0 05/26/2015   GLUCOSE 93 04/20/2015   CHOL 167 04/20/2015   TRIG 61.0 04/20/2015   HDL 70.30 04/20/2015   LDLCALC 85 04/20/2015   ALT 17 04/20/2015   AST 22 04/20/2015   NA 141 04/20/2015   K 3.6 04/20/2015   CL 108 04/20/2015   CREATININE 0.61 04/20/2015   BUN 15 04/20/2015   CO2 24 04/20/2015   TSH 2.04 04/20/2015    No results found.   Assessment & Plan:  Plan  I have changed Ms. Isidore's B-D 3CC LUER-LOK SYR 25GX5/8" to SYRINGE-NEEDLE (DISP) 3 ML. I am also having her maintain her vitamin B-12, Vitamin D, dextroamphetamine, loperamide,  ALPRAZolam, pantoprazole, CARAFATE, sertraline, trimethoprim, rOPINIRole, fluticasone, cyclobenzaprine, FERRALET 90, Diclofenac Sodium, neomycin-polymyxin-hydrocortisone, and cyanocobalamin.  Meds ordered this encounter  Medications  . SYRINGE-NEEDLE, DISP, 3 ML (B-D 3CC LUER-LOK SYR 25GX5/8") 25G X 5/8" 3 ML MISC    Sig: USE AS DIRECTED WITH B-12 INJECTION    Dispense:  16 each    Refill:  3  . cyanocobalamin (,VITAMIN B-12,) 1000 MCG/ML injection    Sig: INJECT 1 ML INTO THE MUSCLE ONCE A WEEK. FOR 4 WEEKS    Dispense:  4 mL    Refill:  3    Problem List Items Addressed This Visit    None    Visit Diagnoses    Right hip pain    -  Primary   Relevant  Orders   Ambulatory referral to Sports Medicine   B12 deficiency       Relevant Medications   SYRINGE-NEEDLE, DISP, 3 ML (B-D 3CC LUER-LOK SYR 25GX5/8") 25G X 5/8" 3 ML MISC   cyanocobalamin (,VITAMIN B-12,) 1000 MCG/ML injection      Follow-up: No Follow-up on file.  [name], DO

## 2015-10-26 ENCOUNTER — Encounter: Payer: Self-pay | Admitting: Family Medicine

## 2015-10-26 NOTE — Telephone Encounter (Signed)
Not sure who is doing the paperwork---  I'm hoping hers has not been sent to scan --- but can who ever is doing the fmla---- add time etc per pt my chart message.?   Thank you-- Dr Carlean Jews

## 2015-11-02 ENCOUNTER — Ambulatory Visit: Payer: BLUE CROSS/BLUE SHIELD | Admitting: Family Medicine

## 2015-11-14 NOTE — Progress Notes (Signed)
[name] [address], [place] [address] Phone: [phone] Subjective:    I'm seeing this patient by the request  of:  [name], DO  CC: Bilateral knee pain follow-up  [id number]  [name] is a 50 y.o. female coming in with complaint of bilateral knee pain. Patient was last seen 5 months ago. Patient has been diagnosed with moderate to severe osteoarthritis of the knees. Patient 5 months ago was given injections of the knees bilaterally. Patient was to do conservative therapy. Patient states Doing well overall. Seems stable.  patient is also complaining of right hip pain. This is a new problem for her. Patient states She was hit by her dog and fell directly onto her stomach. Had pain in the posterior aspect of the hip with 10 days. Since then has seemed to be getting worse. States very localized. States that there is a popping sensation sometimes that can be significantly painful. Patient states though with regular daily activities she seems to do well and it seems to be worse with rest. No significant weakness.  Past Medical History:  Diagnosis Date  . Allergy   . Anemia   . Anxiety   . Asthma   . Depression   . GERD (gastroesophageal reflux disease)   . Heart murmur    Past Surgical History:  Procedure Laterality Date  . ABDOMINAL HYSTERECTOMY     fibroids  . GASTRIC BYPASS    . TUBAL LIGATION     Social History   Social History  . Marital status: Married    Spouse name: N/A  . Number of children: N/A  . Years of education: N/A   Occupational History  .  [employer]    [employer]   Social History Main Topics  . Smoking status: Former Smoker    Quit date: 01/24/1987  . Smokeless tobacco: Never Used  . Alcohol use Yes     Comment: occasionally  . Drug use: No  . Sexual activity: Not Currently   Other Topics Concern  . Not on file   Social History Narrative   Regular exercise--yes   Allergies    Allergen Reactions  . Codeine Itching  . Penicillins Other (See Comments)    Happened in childhood at 50 years of age, unsure of reaction.   Family History  Problem Relation Age of Onset  . Hypertension Father   . Cancer Father     Prostate  . Depression Sister   . Depression Brother   . Diabetes Maternal Grandmother   . Diabetes Maternal Grandfather   . Liver disease Maternal Grandfather   . Cancer Maternal Grandfather     Prostate  . Diabetes Paternal Grandmother   . Diabetes Paternal Grandfather   . Cancer Paternal Grandfather     Lung, Prostate  . Stroke Mother     Past medical history, social, surgical and family history all reviewed in electronic medical record.  No pertanent information unless stated regarding to the chief complaint.   Review of Systems: No headache, visual changes, nausea, vomiting, diarrhea, constipation, dizziness, abdominal pain, skin rash, fevers, chills, night sweats, weight loss, swollen lymph nodes, body aches, joint swelling, muscle aches, chest pain, shortness of breath, mood changes.   Objective  There were no vitals taken for this visit.  General: No apparent distress alert and oriented x3 mood and affect normal, dressed appropriately.  HEENT: Pupils equal, extraocular movements intact  Respiratory: Patient's speak in full  sentences and does not appear short of breath  Cardiovascular: No lower extremity edema, non tender, no erythema  Skin: Warm dry intact with no signs of infection or rash on extremities or on axial skeleton.  Abdomen: Soft nontender  Neuro: Cranial nerves II through XII are intact, neurovascularly intact in all extremities with 2+ DTRs and 2+ pulses.  Lymph: No lymphadenopathy of posterior or anterior cervical chain or axillae bilaterally.  Gait normal with good balance and coordination.  MSK:  Non tender with full range of motion and good stability and symmetric strength and tone of shoulders, elbows, wrist,  and ankles  bilaterally.  Knee:Bilateral Mild lateral tilt of the knee noted. Continued tenderness over the patellofemoral joint bilaterally ROM full in flexion and extension and lower leg rotation. Ligaments with solid consistent endpoints including ACL, PCL, LCL, MCL. Negative Mcmurray's, Apley's, and Thessalonian tests. painful patellar compression. Patellar glide with mild to moderate crepitus. Patellar and quadriceps tendons unremarkable. Hamstring and quadriceps strength is normal.  Stable from previous exam  Hip: Right  ROM IR: 15 Deg, ER: 35 Deg, Flexion: 100 Deg, Extension: 80 Deg, Abduction: 35 Deg, Adduction: 15 Deg Strength IR: 5/5, ER: 5/5, Flexion: 5/5, Extension: 5/5, Abduction: 5/5, Adduction: 5/5 Pelvic alignment unremarkable to inspection and palpation. Standing hip rotation and gait without trendelenburg sign / unsteadiness. Greater trochanter without tenderness to palpation. No tenderness over piriformis and greater trochanter. Positive Faber Mild pain over the right sacroiliac joint  Procedure note [id number]; 15 minutes spent for Therapeutic exercises as stated in above notes.  This included exercises focusing on stretching, strengthening, with significant focus on eccentric aspects. Piriformis Syndrome  Using an anatomical model, reviewed with the patient the structures involved and how they related to diagnosis. The patient indicated understanding.   The patient was given a handout from Dr. Arne Cleveland book "The Sports Medicine Patient Advisor" describing the anatomy and rehabilitation of the following condition: Piriformis Syndrome  Also given a handout with more extensive Piriformis stretching, hip flexor and abductor strengthening, ham stretching  Rec deep massage, explained self-massage with ball   Proper technique shown and discussed handout in great detail with ATC.  All questions were discussed and answered.         Impression and Recommendations:     This  case required medical decision making of moderate complexity.      Note: This dictation was prepared with Dragon dictation along with smaller phrase technology. Any transcriptional errors that result from this process are unintentional.

## 2015-11-15 ENCOUNTER — Encounter: Payer: Self-pay | Admitting: Family Medicine

## 2015-11-15 ENCOUNTER — Ambulatory Visit (INDEPENDENT_AMBULATORY_CARE_PROVIDER_SITE_OTHER): Payer: BLUE CROSS/BLUE SHIELD | Admitting: Family Medicine

## 2015-11-15 ENCOUNTER — Ambulatory Visit (INDEPENDENT_AMBULATORY_CARE_PROVIDER_SITE_OTHER)
Admission: RE | Admit: 2015-11-15 | Discharge: 2015-11-15 | Disposition: A | Discharge status: Home / Self Care | Payer: BLUE CROSS/BLUE SHIELD | Source: Ambulatory Visit | Attending: Family Medicine | Admitting: Family Medicine

## 2015-11-15 VITALS — BP 110/80 | HR 73 | Wt 141.0 lb

## 2015-11-15 DIAGNOSIS — M25551 Pain in right hip: Secondary | ICD-10-CM

## 2015-11-15 DIAGNOSIS — G5701 Lesion of sciatic nerve, right lower limb: Secondary | ICD-10-CM | POA: Diagnosis not present

## 2015-11-15 MED ORDER — METHYLPREDNISOLONE ACETATE 80 MG/ML IJ SUSP
80.0000 mg | Freq: Once | INTRAMUSCULAR | Status: AC
  Administered 2015-11-15: 80 mg via INTRAMUSCULAR

## 2015-11-15 MED ORDER — KETOROLAC TROMETHAMINE 60 MG/2ML IM SOLN
60.0000 mg | Freq: Once | INTRAMUSCULAR | Status: AC
  Administered 2015-11-15: 60 mg via INTRAMUSCULAR

## 2015-11-15 MED ORDER — GABAPENTIN 100 MG PO CAPS: 200.0000 mg | ORAL_CAPSULE | Freq: Every day | ORAL | 3 refills | Status: DC

## 2015-11-15 NOTE — Assessment & Plan Note (Signed)
I believe the patient did have a strain to her piriformis muscle from the extension when she fell. Patient given home exercises and work with Product/process development scientist, we discussed icing regimen. Gabapentin given for nighttime relief pain. Warned of potential side effects. Patient will continue to be active. Differential includes a intra-articular hip pathology but does have full range of motion. Patient will be sent for x-rays.

## 2015-11-15 NOTE — Patient Instructions (Addendum)
Good to see you Xray downstairs  Ice is your friend. Ice 20 minutes 2 times daily. Usually after activity and before bed. 2 injections today  Gabapentin 200mg  at night Exercises 3 times a week.  See me again in 3-4 weeks to make sure doing better.

## 2015-11-17 ENCOUNTER — Ambulatory Visit: Payer: BLUE CROSS/BLUE SHIELD | Admitting: Family Medicine

## 2015-12-13 ENCOUNTER — Other Ambulatory Visit: Payer: Self-pay | Admitting: Family Medicine

## 2015-12-13 DIAGNOSIS — M62838 Other muscle spasm: Secondary | ICD-10-CM

## 2015-12-14 NOTE — Telephone Encounter (Signed)
Rx send by PCP.

## 2015-12-14 NOTE — Telephone Encounter (Signed)
Cyclobenzaprine 10mg  tab TID PRN Last Rf: 10/15/2015 Last Ov: 10/15/2015  RF request #[id number], 2 RF  Please advise.

## 2016-02-05 ENCOUNTER — Other Ambulatory Visit: Payer: Self-pay | Admitting: Family Medicine

## 2016-02-05 DIAGNOSIS — M62838 Other muscle spasm: Secondary | ICD-10-CM

## 2016-03-04 ENCOUNTER — Other Ambulatory Visit: Payer: Self-pay | Admitting: Family Medicine

## 2016-03-06 NOTE — Telephone Encounter (Signed)
Refill done.  

## 2016-03-12 ENCOUNTER — Other Ambulatory Visit: Payer: Self-pay | Admitting: Family Medicine

## 2016-03-12 DIAGNOSIS — E538 Deficiency of other specified B group vitamins: Secondary | ICD-10-CM

## 2016-03-21 ENCOUNTER — Ambulatory Visit (HOSPITAL_BASED_OUTPATIENT_CLINIC_OR_DEPARTMENT_OTHER)
Admission: RE | Admit: 2016-03-21 | Discharge: 2016-03-21 | Disposition: A | Discharge status: Home / Self Care | Payer: BLUE CROSS/BLUE SHIELD | Source: Ambulatory Visit | Attending: Family Medicine | Admitting: Family Medicine

## 2016-03-21 ENCOUNTER — Ambulatory Visit (INDEPENDENT_AMBULATORY_CARE_PROVIDER_SITE_OTHER): Payer: BLUE CROSS/BLUE SHIELD | Admitting: Family Medicine

## 2016-03-21 ENCOUNTER — Telehealth: Payer: Self-pay | Admitting: Family Medicine

## 2016-03-21 ENCOUNTER — Encounter: Payer: Self-pay | Admitting: Family Medicine

## 2016-03-21 VITALS — BP 107/73 | HR 92 | Temp 99.3°F | Wt 129.6 lb

## 2016-03-21 DIAGNOSIS — R05 Cough: Secondary | ICD-10-CM

## 2016-03-21 DIAGNOSIS — R059 Cough, unspecified: Secondary | ICD-10-CM

## 2016-03-21 DIAGNOSIS — R197 Diarrhea, unspecified: Secondary | ICD-10-CM

## 2016-03-21 DIAGNOSIS — R509 Fever, unspecified: Secondary | ICD-10-CM | POA: Diagnosis not present

## 2016-03-21 DIAGNOSIS — R6883 Chills (without fever): Secondary | ICD-10-CM | POA: Diagnosis not present

## 2016-03-21 DIAGNOSIS — R52 Pain, unspecified: Secondary | ICD-10-CM | POA: Diagnosis not present

## 2016-03-21 DIAGNOSIS — J101 Influenza due to other identified influenza virus with other respiratory manifestations: Secondary | ICD-10-CM

## 2016-03-21 LAB — POC INFLUENZA A&B (BINAX/QUICKVUE)
INFLUENZA A, POC: POSITIVE — AB
INFLUENZA B, POC: NEGATIVE

## 2016-03-21 MED ORDER — PROMETHAZINE-DM 6.25-15 MG/5ML PO SYRP: 5.0000 mL | ORAL_SOLUTION | Freq: Four times a day (QID) | ORAL | 0 refills | Status: DC | PRN

## 2016-03-21 MED ORDER — OSELTAMIVIR PHOSPHATE 75 MG PO CAPS: 75.0000 mg | ORAL_CAPSULE | Freq: Two times a day (BID) | ORAL | 0 refills | Status: DC

## 2016-03-21 MED FILL — OSELTAMIVIR PHOSPHATE 75 MG: 75 | 5 days supply | Qty: 10 | Fill #0

## 2016-03-21 MED FILL — PROMETHAZINE-DM SYRUP: 6.25-15 | 6 days supply | Qty: 118 | Fill #0

## 2016-03-21 NOTE — Telephone Encounter (Signed)
Patient will need a note for work.  States she was told as long as she has a fever you did not want her to return to work.  She was out yesterday and would like a note to be out the remainder of the week  Let me know and will take care of.

## 2016-03-21 NOTE — Patient Instructions (Signed)

## 2016-03-21 NOTE — Progress Notes (Signed)
Pre visit review using our clinic review tool, if applicable. No additional management support is needed unless otherwise documented below in the visit note. 

## 2016-03-21 NOTE — Telephone Encounter (Signed)
Letter completed and mailed per patient informed and requested to mail it.

## 2016-03-21 NOTE — Progress Notes (Signed)
Patient ID: [name], female   DOB: 03/04/1965, 51 y.o.   MRN: [id number]   Subjective:    Patient ID: [name], female    DOB: 01/14/1966, 51 y.o.   MRN: [id number]  Chief Complaint  Patient presents with  . Cough    Cough  This is a new problem. The current episode started in the past 7 days. The problem has been unchanged. Associated symptoms include chills and headaches. Pertinent negatives include no chest pain, rash or shortness of breath. The treatment provided no relief.    Patient is in today for an acute visit. Patient states that her cough began on Sunday evening. States that she has experienced some chills, body aches, headaches, chest congestion and some diarrhea. Patient also states that she had a fever Sunday night/Monday morning. Patient states that she has tried Tylenol Cold and Flu as well as Mucinex.  I acted as a Education administrator for [employer], DO. Raiford Noble, [place]   Past Medical History:  Diagnosis Date  . Allergy   . Anemia   . Anxiety   . Asthma   . Depression   . GERD (gastroesophageal reflux disease)   . Heart murmur     Past Surgical History:  Procedure Laterality Date  . ABDOMINAL HYSTERECTOMY     fibroids  . GASTRIC BYPASS    . TUBAL LIGATION      Family History  Problem Relation Age of Onset  . Hypertension Father   . Cancer Father     Prostate  . Depression Sister   . Depression Brother   . Diabetes Maternal Grandmother   . Diabetes Maternal Grandfather   . Liver disease Maternal Grandfather   . Cancer Maternal Grandfather     Prostate  . Diabetes Paternal Grandmother   . Diabetes Paternal Grandfather   . Cancer Paternal Grandfather     Lung, Prostate  . Stroke Mother     Social History   Social History  . Marital status: Married    Spouse name: N/A  . Number of children: N/A  . Years of education: N/A   Occupational History  .  [employer]    [employer]   Social History Main Topics  . Smoking status: Former  Smoker    Quit date: 01/24/1987  . Smokeless tobacco: Never Used  . Alcohol use Yes     Comment: occasionally  . Drug use: No  . Sexual activity: Not Currently   Other Topics Concern  . Not on file   Social History Narrative   Regular exercise--yes    Outpatient Medications Prior to Visit  Medication Sig Dispense Refill  . ALPRAZolam (XANAX) 1 MG tablet     . CARAFATE 1 GM/10ML suspension 10 mls by mouth twice daily  0  . Cholecalciferol (VITAMIN D) 2000 UNITS CAPS Take 1 capsule by mouth daily. Reported on 05/04/2015    . cyanocobalamin (,VITAMIN B-12,) 1000 MCG/ML injection INJECT 1 ML INTO THE MUSCLE ONCE A WEEK. FOR 4 WEEKS 4 mL 3  . cyclobenzaprine (FLEXERIL) 10 MG tablet TAKE 1 TABLET (10 MG TOTAL) BY MOUTH 3 (THREE) TIMES DAILY AS NEEDED. 30 tablet 0  . dextroamphetamine (DEXTROSTAT) 10 MG tablet Take 10 mg by mouth 3 (three) times daily.    . Diclofenac Sodium (PENNSAID) 2 % SOLN Place 2 application onto the skin 2 (two) times daily. 112 g 3  . Fe Cbn-Fe Gluc-FA-B12-C-DSS (FERRALET 90) 90-1 MG TABS TAKE 1 TABLET BY MOUTH EVERY DAY 30  each 11  . fluticasone (FLONASE) 50 MCG/ACT nasal spray Place 2 sprays into both nostrils daily. 16 g 11  . gabapentin (NEURONTIN) 100 MG capsule TAKE 2 CAPSULES (200 MG TOTAL) BY MOUTH AT BEDTIME. 180 capsule 1  . loperamide (ANTI-DIARRHEAL) 2 MG capsule Take 2 mg by mouth 3 (three) times daily.    Marland Kitchen neomycin-polymyxin-hydrocortisone (CORTISPORIN) otic solution 2-3 drops to left 2nd toe 10 mL 2  . pantoprazole (PROTONIX) 20 MG tablet Take 20 mg by mouth daily.  3  . rOPINIRole (REQUIP) 1 MG tablet TAKE 1 TABLET (1 MG TOTAL) BY MOUTH AT BEDTIME. 90 tablet 3  . sertraline (ZOLOFT) 100 MG tablet Take 3 tablets by mouth daily.    . SYRINGE-NEEDLE, DISP, 3 ML (B-D 3CC LUER-LOK SYR 25GX5/8") 25G X 5/8" 3 ML MISC USE AS DIRECTED WITH B-12 INJECTION 16 each 3  . trimethoprim (TRIMPEX) 100 MG tablet TAKE 1 TABLET (100 MG TOTAL) BY MOUTH 2 (TWO) TIMES DAILY.  180 tablet 3  . vitamin B-12 (CYANOCOBALAMIN) 1000 MCG tablet Take 1,000 mcg by mouth daily.       No facility-administered medications prior to visit.     Allergies  Allergen Reactions  . Codeine Itching  . Penicillins Other (See Comments)    Happened in childhood at 52 years of age, unsure of reaction.    Review of Systems  Constitutional: Positive for chills. Negative for malaise/fatigue.       Fever Sunday night/ Monday morning.  HENT: Negative for congestion.   Eyes: Negative for blurred vision.  Respiratory: Positive for cough. Negative for shortness of breath.   Cardiovascular: Negative for chest pain, palpitations and leg swelling.  Gastrointestinal: Positive for diarrhea and vomiting.       Some vomiting.  Musculoskeletal: Negative for back pain.       Body aches.  Skin: Negative for rash.  Neurological: Positive for headaches. Negative for loss of consciousness.       Objective:    Physical Exam  Constitutional: She is oriented to person, place, and time. She appears well-developed and well-nourished. No distress.  HENT:  Head: Normocephalic and atraumatic.  Eyes: Conjunctivae are normal.  Neck: Normal range of motion. No thyromegaly present.  Cardiovascular: Normal rate and regular rhythm.   Pulmonary/Chest: Effort normal and breath sounds normal. She has no wheezes.  Abdominal: Soft. Bowel sounds are normal. There is no tenderness.  Musculoskeletal: She exhibits no edema or deformity.  Neurological: She is alert and oriented to person, place, and time.  Skin: Skin is warm and dry. She is not diaphoretic.  Psychiatric: She has a normal mood and affect.    BP 107/73 (BP Location: Left Arm, Patient Position: Sitting, Cuff Size: Normal)   Pulse 92   Temp 99.3 F (37.4 C) (Oral)   Wt 129 lb 9.6 oz (58.8 kg)   SpO2 97% Comment: RA  BMI 23.70 kg/m  Wt Readings from Last 3 Encounters:  03/21/16 129 lb 9.6 oz (58.8 kg)  11/15/15 141 lb (64 kg)  10/15/15 136  lb (61.7 kg)     Lab Results  Component Value Date   WBC 5.8 05/26/2015   HGB 11.6 (L) 05/26/2015   HCT 35.3 (L) 05/26/2015   PLT 237.0 05/26/2015   GLUCOSE 93 04/20/2015   CHOL 167 04/20/2015   TRIG 61.0 04/20/2015   HDL 70.30 04/20/2015   LDLCALC 85 04/20/2015   ALT 17 04/20/2015   AST 22 04/20/2015   NA 141 04/20/2015  K 3.6 04/20/2015   CL 108 04/20/2015   CREATININE 0.61 04/20/2015   BUN 15 04/20/2015   CO2 24 04/20/2015   TSH 2.04 04/20/2015    Lab Results  Component Value Date   TSH 2.04 04/20/2015   Lab Results  Component Value Date   WBC 5.8 05/26/2015   HGB 11.6 (L) 05/26/2015   HCT 35.3 (L) 05/26/2015   MCV 81.9 05/26/2015   PLT 237.0 05/26/2015   Lab Results  Component Value Date   NA 141 04/20/2015   K 3.6 04/20/2015   CO2 24 04/20/2015   GLUCOSE 93 04/20/2015   BUN 15 04/20/2015   CREATININE 0.61 04/20/2015   BILITOT 0.5 04/20/2015   ALKPHOS 49 04/20/2015   AST 22 04/20/2015   ALT 17 04/20/2015   PROT 6.7 04/20/2015   ALBUMIN 4.1 04/20/2015   CALCIUM 8.9 04/20/2015   GFR 110.33 04/20/2015   Lab Results  Component Value Date   CHOL 167 04/20/2015   Lab Results  Component Value Date   HDL 70.30 04/20/2015   Lab Results  Component Value Date   LDLCALC 85 04/20/2015   Lab Results  Component Value Date   TRIG 61.0 04/20/2015   Lab Results  Component Value Date   CHOLHDL 2 04/20/2015   No results found for: HGBA1C     Assessment & Plan:   Problem List Items Addressed This Visit      Unprioritized   Cough - Primary   Relevant Medications   promethazine-dextromethorphan (PROMETHAZINE-DM) 6.25-15 MG/5ML syrup   Other Relevant Orders   DG Chest 2 View (Completed)   POC Influenza A&B (Binax test) (Completed)    Other Visit Diagnoses    Body aches       Relevant Orders   POC Influenza A&B (Binax test) (Completed)   Chills       Relevant Orders   POC Influenza A&B (Binax test) (Completed)   Fever, unspecified fever  cause       Relevant Orders   POC Influenza A&B (Binax test) (Completed)   Diarrhea, unspecified type       Relevant Orders   POC Influenza A&B (Binax test) (Completed)   Influenza A       Relevant Medications   oseltamivir (TAMIFLU) 75 MG capsule      I am having Ms. Cahue start on oseltamivir and promethazine-dextromethorphan. I am also having her maintain her vitamin B-12, Vitamin D, dextroamphetamine, loperamide, ALPRAZolam, pantoprazole, CARAFATE, sertraline, trimethoprim, rOPINIRole, fluticasone, FERRALET 90, Diclofenac Sodium, neomycin-polymyxin-hydrocortisone, SYRINGE-NEEDLE (DISP) 3 ML, cyclobenzaprine, gabapentin, cyanocobalamin, and TRINTELLIX.  Meds ordered this encounter  Medications  . TRINTELLIX 10 MG TABS  . oseltamivir (TAMIFLU) 75 MG capsule    Sig: Take 1 capsule (75 mg total) by mouth 2 (two) times daily.    Dispense:  10 capsule    Refill:  0  . promethazine-dextromethorphan (PROMETHAZINE-DM) 6.25-15 MG/5ML syrup    Sig: Take 5 mLs by mouth 4 (four) times daily as needed.    Dispense:  118 mL    Refill:  0    CMA served as scribe during this visit. History, Physical and Plan performed by medical provider. Documentation and orders reviewed and attested to.  [name], DO

## 2016-03-21 NOTE — Telephone Encounter (Signed)
Ok to write out for the week

## 2016-03-27 ENCOUNTER — Telehealth: Payer: Self-pay | Admitting: Family Medicine

## 2016-03-27 DIAGNOSIS — Z0279 Encounter for issue of other medical certificate: Secondary | ICD-10-CM

## 2016-03-27 NOTE — Telephone Encounter (Signed)
Pt dropped off document to be filled out with a hand written letter [facility]). Pt states would like to have document faxed to [phone] or [phone] and ok to send a copy of documents mailed to her. Document put at front office tray.

## 2016-04-04 NOTE — Telephone Encounter (Signed)
Paperwork received. Paperwork process initiated.

## 2016-04-11 NOTE — Telephone Encounter (Signed)
°  Relation to pt: self  Call back [phone]   Reason for call:  Patient checking on the status of FMLA paperwork, patient states deadline was on 04/06/16, please advise

## 2016-04-12 NOTE — Telephone Encounter (Signed)
Paperwork was successfully faxed to [facility] on 04/05/16. Pt notified and made aware.  Pt stated she called [facility] last night to verify that they had received it.  She stated that they informed her that they had and that a copy had been mailed to her.  She received mailed copy today.  No further questions or concerns voiced at this time.

## 2016-04-25 ENCOUNTER — Encounter: Payer: BLUE CROSS/BLUE SHIELD | Admitting: Family Medicine

## 2016-04-28 ENCOUNTER — Other Ambulatory Visit: Payer: Self-pay | Admitting: Family Medicine

## 2016-04-28 DIAGNOSIS — J0110 Acute frontal sinusitis, unspecified: Secondary | ICD-10-CM

## 2016-04-28 DIAGNOSIS — M62838 Other muscle spasm: Secondary | ICD-10-CM

## 2016-04-28 MED ORDER — CYCLOBENZAPRINE HCL 10 MG PO TABS: 10.0000 mg | ORAL_TABLET | Freq: Three times a day (TID) | ORAL | 0 refills | Status: DC | PRN

## 2016-04-28 NOTE — Telephone Encounter (Signed)
Not a controlled substance Ok to refill

## 2016-04-28 NOTE — Telephone Encounter (Signed)
Hard copy of Rx request faxed to [facility] ([place], [place]): [phone].

## 2016-04-28 NOTE — Telephone Encounter (Signed)
Received refill request for Cyclobenzaprine 10mg  (take 1 tab po TID prn) #30, )RF  Last RF: 02/07/2016 Last OV: 03/21/2016 Next OV: 05/01/2016 UDS: 03/19/14 controlled substance contract signed, uds sample given, Low risk, next screen 09/17/14.  Forwarded to Provider for review, approval or denial.

## 2016-05-01 ENCOUNTER — Ambulatory Visit (INDEPENDENT_AMBULATORY_CARE_PROVIDER_SITE_OTHER): Payer: BLUE CROSS/BLUE SHIELD | Admitting: Family Medicine

## 2016-05-01 ENCOUNTER — Other Ambulatory Visit (INDEPENDENT_AMBULATORY_CARE_PROVIDER_SITE_OTHER): Payer: BLUE CROSS/BLUE SHIELD

## 2016-05-01 ENCOUNTER — Telehealth: Payer: Self-pay | Admitting: Family Medicine

## 2016-05-01 ENCOUNTER — Encounter: Payer: Self-pay | Admitting: Family Medicine

## 2016-05-01 VITALS — BP 112/60 | HR 85 | Temp 98.6°F | Resp 16 | Ht 62.0 in | Wt 128.0 lb

## 2016-05-01 DIAGNOSIS — Z0000 Encounter for general adult medical examination without abnormal findings: Secondary | ICD-10-CM | POA: Insufficient documentation

## 2016-05-01 DIAGNOSIS — Z9884 Bariatric surgery status: Secondary | ICD-10-CM | POA: Diagnosis not present

## 2016-05-01 DIAGNOSIS — Z23 Encounter for immunization: Secondary | ICD-10-CM | POA: Diagnosis not present

## 2016-05-01 DIAGNOSIS — R8290 Unspecified abnormal findings in urine: Secondary | ICD-10-CM

## 2016-05-01 DIAGNOSIS — F32A Depression, unspecified: Secondary | ICD-10-CM

## 2016-05-01 DIAGNOSIS — F329 Major depressive disorder, single episode, unspecified: Secondary | ICD-10-CM | POA: Diagnosis not present

## 2016-05-01 LAB — CBC WITH DIFFERENTIAL/PLATELET
BASOS ABS: 0 10*3/uL (ref 0.0–0.1)
Basophils Relative: 0.9 % (ref 0.0–3.0)
EOS PCT: 1.9 % (ref 0.0–5.0)
Eosinophils Absolute: 0.1 10*3/uL (ref 0.0–0.7)
HCT: 40 % (ref 36.0–46.0)
HEMOGLOBIN: 13.2 g/dL (ref 12.0–15.0)
Lymphocytes Relative: 33.1 % (ref 12.0–46.0)
Lymphs Abs: 1.7 10*3/uL (ref 0.7–4.0)
MCHC: 33.1 g/dL (ref 30.0–36.0)
MCV: 87.8 fl (ref 78.0–100.0)
MONO ABS: 0.4 10*3/uL (ref 0.1–1.0)
MONOS PCT: 6.8 % (ref 3.0–12.0)
Neutro Abs: 2.9 10*3/uL (ref 1.4–7.7)
Neutrophils Relative %: 57.3 % (ref 43.0–77.0)
Platelets: 247 10*3/uL (ref 150.0–400.0)
RBC: 4.55 Mil/uL (ref 3.87–5.11)
RDW: 15.7 % — ABNORMAL HIGH (ref 11.5–15.5)
WBC: 5.1 10*3/uL (ref 4.0–10.5)

## 2016-05-01 LAB — POC URINALSYSI DIPSTICK (AUTOMATED)
Bilirubin, UA: NEGATIVE
Blood, UA: NEGATIVE
Ketones, UA: NEGATIVE
LEUKOCYTES UA: NEGATIVE
Nitrite, UA: POSITIVE
PROTEIN UA: NEGATIVE
SPEC GRAV UA: 1.015 (ref 1.030–1.035)
UROBILINOGEN UA: NEGATIVE (ref ?–2.0)
pH, UA: 6 (ref 5.0–8.0)

## 2016-05-01 LAB — TSH: TSH: 2.46 u[IU]/mL (ref 0.35–4.50)

## 2016-05-01 LAB — COMPREHENSIVE METABOLIC PANEL
ALBUMIN: 3.9 g/dL (ref 3.5–5.2)
ALK PHOS: 47 U/L (ref 39–117)
ALT: 13 U/L (ref 0–35)
AST: 19 U/L (ref 0–37)
BILIRUBIN TOTAL: 0.5 mg/dL (ref 0.2–1.2)
BUN: 12 mg/dL (ref 6–23)
CO2: 28 mEq/L (ref 19–32)
CREATININE: 0.67 mg/dL (ref 0.40–1.20)
Calcium: 9 mg/dL (ref 8.4–10.5)
Chloride: 106 mEq/L (ref 96–112)
GFR: 98.6 mL/min (ref >60.00)
Glucose, Bld: 100 mg/dL — ABNORMAL HIGH (ref 70–99)
POTASSIUM: 4.4 meq/L (ref 3.5–5.1)
SODIUM: 139 meq/L (ref 135–145)
TOTAL PROTEIN: 6.6 g/dL (ref 6.0–8.3)

## 2016-05-01 LAB — LIPID PANEL
CHOLESTEROL: 163 mg/dL (ref 0–200)
HDL: 71.1 mg/dL (ref >39.00)
LDL Cholesterol: 73 mg/dL (ref 0–99)
NonHDL: 92.02
TRIGLYCERIDES: 93 mg/dL (ref 0.0–149.0)
Total CHOL/HDL Ratio: 2
VLDL: 18.6 mg/dL (ref 0.0–40.0)

## 2016-05-01 LAB — VITAMIN B12: Vitamin B-12: 307 pg/mL (ref 211–911)

## 2016-05-01 LAB — VITAMIN D 25 HYDROXY (VIT D DEFICIENCY, FRACTURES): VITD: 24.88 ng/mL — AB (ref 30.00–100.00)

## 2016-05-01 MED ORDER — VITAMIN D3 50 MCG (2000 UT) PO CAPS: 2000.0000 [IU] | ORAL_CAPSULE | Freq: Every day | ORAL | 1 refills | Status: DC

## 2016-05-01 MED ORDER — VITAMIN D (ERGOCALCIFEROL) 1.25 MG (50000 UNIT) PO CAPS: 50000.0000 [IU] | ORAL_CAPSULE | ORAL | 2 refills | Status: DC

## 2016-05-01 MED FILL — VITAMIN D 1,000 UNITS TAB: 25 MCG | 50 days supply | Qty: 100 | Fill #0

## 2016-05-01 MED FILL — VIT D2 1.25 MG (50,000 UNIT: 1.25 MG | 28 days supply | Qty: 4 | Fill #0

## 2016-05-01 NOTE — Progress Notes (Signed)
Subjective:  I acted as a Education administrator for Dr. Royden Purl, LPN    Patient ID: [name], female    DOB: Sep 17, 1965, 51 y.o.   MRN: [id number]  Chief Complaint  Patient presents with  . Annual Exam    HPI  Patient is in today for annual physical. Patient report she had an headache since Wednesday due to not having a Rx Trintellix. Patient report she received samples until pharmacy has medication in for Trintellix.  Patient Care Team: [name], DO as PCP - General   Past Medical History:  Diagnosis Date  . Allergy   . Anemia   . Anxiety   . Asthma   . Depression   . GERD (gastroesophageal reflux disease)   . Heart murmur     Past Surgical History:  Procedure Laterality Date  . ABDOMINAL HYSTERECTOMY     fibroids  . GASTRIC BYPASS    . TUBAL LIGATION      Family History  Problem Relation Age of Onset  . Hypertension Father   . Cancer Father     Prostate  . Depression Sister   . Depression Brother   . Diabetes Maternal Grandmother   . Diabetes Maternal Grandfather   . Liver disease Maternal Grandfather   . Cancer Maternal Grandfather     Prostate  . Diabetes Paternal Grandmother   . Diabetes Paternal Grandfather   . Cancer Paternal Grandfather     Lung, Prostate  . Stroke Mother     Social History   Social History  . Marital status: Married    Spouse name: N/A  . Number of children: N/A  . Years of education: N/A   Occupational History  .  [employer]    [employer]   Social History Main Topics  . Smoking status: Former Smoker    Quit date: 01/24/1987  . Smokeless tobacco: Never Used  . Alcohol use Yes     Comment: occasionally  . Drug use: No  . Sexual activity: Not Currently   Other Topics Concern  . Not on file   Social History Narrative   Regular exercise--yes    Outpatient Medications Prior to Visit  Medication Sig Dispense Refill  . ALPRAZolam (XANAX) 1 MG tablet     . CARAFATE 1 GM/10ML suspension 10 mls by mouth  twice daily  0  . cyanocobalamin (,VITAMIN B-12,) 1000 MCG/ML injection INJECT 1 ML INTO THE MUSCLE ONCE A WEEK. FOR 4 WEEKS 4 mL 3  . cyclobenzaprine (FLEXERIL) 10 MG tablet Take 1 tablet (10 mg total) by mouth 3 (three) times daily as needed. 30 tablet 0  . dextroamphetamine (DEXTROSTAT) 10 MG tablet Take 10 mg by mouth 3 (three) times daily.    . Diclofenac Sodium (PENNSAID) 2 % SOLN Place 2 application onto the skin 2 (two) times daily. 112 g 3  . Fe Cbn-Fe Gluc-FA-B12-C-DSS (FERRALET 90) 90-1 MG TABS TAKE 1 TABLET BY MOUTH EVERY DAY 30 each 11  . fluticasone (FLONASE) 50 MCG/ACT nasal spray PLACE 2 SPRAYS INTO BOTH NOSTRILS DAILY. 16 g 5  . gabapentin (NEURONTIN) 100 MG capsule TAKE 2 CAPSULES (200 MG TOTAL) BY MOUTH AT BEDTIME. 180 capsule 1  . loperamide (ANTI-DIARRHEAL) 2 MG capsule Take 2 mg by mouth 3 (three) times daily.    Marland Kitchen neomycin-polymyxin-hydrocortisone (CORTISPORIN) otic solution 2-3 drops to left 2nd toe 10 mL 2  . oseltamivir (TAMIFLU) 75 MG capsule Take 1 capsule (75 mg total) by mouth 2 (two)  times daily. 10 capsule 0  . pantoprazole (PROTONIX) 20 MG tablet Take 20 mg by mouth daily.  3  . promethazine-dextromethorphan (PROMETHAZINE-DM) 6.25-15 MG/5ML syrup Take 5 mLs by mouth 4 (four) times daily as needed. 118 mL 0  . rOPINIRole (REQUIP) 1 MG tablet TAKE 1 TABLET (1 MG TOTAL) BY MOUTH AT BEDTIME. 90 tablet 3  . sertraline (ZOLOFT) 100 MG tablet Take 3 tablets by mouth daily.    . SYRINGE-NEEDLE, DISP, 3 ML (B-D 3CC LUER-LOK SYR 25GX5/8") 25G X 5/8" 3 ML MISC USE AS DIRECTED WITH B-12 INJECTION 16 each 3  . trimethoprim (TRIMPEX) 100 MG tablet TAKE 1 TABLET (100 MG TOTAL) BY MOUTH 2 (TWO) TIMES DAILY. 180 tablet 3  . TRINTELLIX 10 MG TABS     . vitamin B-12 (CYANOCOBALAMIN) 1000 MCG tablet Take 1,000 mcg by mouth daily.      . Cholecalciferol (VITAMIN D) 2000 UNITS CAPS Take 1 capsule by mouth daily. Reported on 05/04/2015     No facility-administered medications prior to  visit.     Allergies  Allergen Reactions  . Codeine Itching  . Penicillins Other (See Comments)    Happened in childhood at 51 years of age, unsure of reaction.    Review of Systems  Constitutional: Negative for fever.  HENT: Negative for congestion.   Eyes: Negative for blurred vision.  Respiratory: Negative for cough.   Cardiovascular: Negative for chest pain and palpitations.  Gastrointestinal: Negative for vomiting.  Musculoskeletal: Negative for back pain.  Skin: Negative for rash.  Neurological: Negative for loss of consciousness and headaches.       Objective:    Physical Exam  Constitutional: She is oriented to person, place, and time. She appears well-developed and well-nourished. No distress.  HENT:  Head: Normocephalic and atraumatic.  Right Ear: External ear normal.  Left Ear: External ear normal.  Nose: Nose normal.  Mouth/Throat: Oropharynx is clear and moist.  Eyes: Conjunctivae and EOM are normal. Pupils are equal, round, and reactive to light.  Neck: Normal range of motion. Neck supple. No thyromegaly present.  Cardiovascular: Normal rate, regular rhythm and normal heart sounds.   No murmur heard. Pulmonary/Chest: Effort normal and breath sounds normal. No respiratory distress. She has no wheezes. She has no rales. She exhibits no tenderness.  Abdominal: Soft. Bowel sounds are normal. There is no tenderness.  Musculoskeletal: Normal range of motion. She exhibits no edema or deformity.  Neurological: She is alert and oriented to person, place, and time.  Skin: Skin is warm and dry. She is not diaphoretic.  Psychiatric: She has a normal mood and affect. Her behavior is normal. Judgment and thought content normal.  Nursing note and vitals reviewed. breast/ pelvic-- gyn  BP 112/60 (BP Location: Left Arm, Patient Position: Sitting, Cuff Size: Normal)   Pulse 85   Temp 98.6 F (37 C) (Oral)   Resp 16   Ht 5\' 2"  (1.575 m)   Wt 128 lb (58.1 kg)   SpO2 99%    BMI 23.41 kg/m  Wt Readings from Last 3 Encounters:  05/01/16 128 lb (58.1 kg)  03/21/16 129 lb 9.6 oz (58.8 kg)  11/15/15 141 lb (64 kg)   BP Readings from Last 3 Encounters:  05/01/16 112/60  03/21/16 107/73  11/15/15 110/80     Immunization History  Administered Date(s) Administered  . H1N1 02/27/2008  . Influenza Whole 12/11/2007, 11/03/2009, 11/11/2011  . Influenza,inj,Quad PF,36+ Mos 10/23/2012, 11/18/2013, 11/17/2014, 10/15/2015  . Td 02/27/2008  .  Zoster Recombinat (Shingrix) 05/01/2016    Health Maintenance  Topic Date Due  . PAP SMEAR  04/02/2016  . HIV Screening  10/14/2016 (Originally 04/08/1980)  . INFLUENZA VACCINE  08/23/2016  . MAMMOGRAM  10/13/2016  . TETANUS/TDAP  02/26/2018  . COLONOSCOPY  04/16/2022    Lab Results  Component Value Date   WBC 5.1 05/01/2016   HGB 13.2 05/01/2016   HCT 40.0 05/01/2016   PLT 247.0 05/01/2016   GLUCOSE 100 (H) 05/01/2016   CHOL 163 05/01/2016   TRIG 93.0 05/01/2016   HDL 71.10 05/01/2016   LDLCALC 73 05/01/2016   ALT 13 05/01/2016   AST 19 05/01/2016   NA 139 05/01/2016   K 4.4 05/01/2016   CL 106 05/01/2016   CREATININE 0.67 05/01/2016   BUN 12 05/01/2016   CO2 28 05/01/2016   TSH 2.46 05/01/2016    Lab Results  Component Value Date   TSH 2.46 05/01/2016   Lab Results  Component Value Date   WBC 5.1 05/01/2016   HGB 13.2 05/01/2016   HCT 40.0 05/01/2016   MCV 87.8 05/01/2016   PLT 247.0 05/01/2016   Lab Results  Component Value Date   NA 139 05/01/2016   K 4.4 05/01/2016   CO2 28 05/01/2016   GLUCOSE 100 (H) 05/01/2016   BUN 12 05/01/2016   CREATININE 0.67 05/01/2016   BILITOT 0.5 05/01/2016   ALKPHOS 47 05/01/2016   AST 19 05/01/2016   ALT 13 05/01/2016   PROT 6.6 05/01/2016   ALBUMIN 3.9 05/01/2016   CALCIUM 9.0 05/01/2016   GFR 98.60 05/01/2016   Lab Results  Component Value Date   CHOL 163 05/01/2016   Lab Results  Component Value Date   HDL 71.10 05/01/2016   Lab Results    Component Value Date   LDLCALC 73 05/01/2016   Lab Results  Component Value Date   TRIG 93.0 05/01/2016   Lab Results  Component Value Date   CHOLHDL 2 05/01/2016   No results found for: HGBA1C       Assessment & Plan:   Problem List Items Addressed This Visit      Unprioritized   Preventative health care - Primary    See avs Check labs ghm utd shingrix given today       Other Visit Diagnoses    Need for shingles vaccine       Relevant Orders   Varicella-zoster vaccine IM (Shingrix) (Completed)      I have discontinued Ms. Aloisi's Vitamin D. I am also having her start on Vitamin D (Ergocalciferol) and Vitamin D3. Additionally, I am having her maintain her vitamin B-12, dextroamphetamine, loperamide, ALPRAZolam, pantoprazole, CARAFATE, sertraline, trimethoprim, rOPINIRole, FERRALET 90, Diclofenac Sodium, neomycin-polymyxin-hydrocortisone, SYRINGE-NEEDLE (DISP) 3 ML, gabapentin, cyanocobalamin, TRINTELLIX, oseltamivir, promethazine-dextromethorphan, fluticasone, and cyclobenzaprine.  Meds ordered this encounter  Medications  . Vitamin D, Ergocalciferol, (DRISDOL) 50000 units CAPS capsule    Sig: Take 1 capsule (50,000 Units total) by mouth every 7 (seven) days.    Dispense:  4 capsule    Refill:  2  . Cholecalciferol (VITAMIN D3) 2000 units capsule    Sig: Take 1 capsule (2,000 Units total) by mouth daily.    Dispense:  30 capsule    Refill:  1   CMA served as scribe during this visit. History, Physical and Plan performed by medical provider. Documentation and orders reviewed and attested to.  [name], DO   Patient ID: [name], female   DOB:  [birth year], 51 y.o.   MRN: [id number]

## 2016-05-01 NOTE — Telephone Encounter (Signed)
yes

## 2016-05-01 NOTE — Telephone Encounter (Signed)
Ordered

## 2016-05-01 NOTE — Assessment & Plan Note (Signed)
See avs Check labs ghm utd shingrix given today

## 2016-05-01 NOTE — Telephone Encounter (Signed)
Patient states she will come now for labs, doesn't doesn't reflect orders.

## 2016-05-01 NOTE — Patient Instructions (Signed)

## 2016-05-01 NOTE — Progress Notes (Signed)
Pre visit review using our clinic review tool, if applicable. No additional management support is needed unless otherwise documented below in the visit note. 

## 2016-05-01 NOTE — Telephone Encounter (Signed)
I can put in the usual 4 tests, but do you want to add a B12 and vit D

## 2016-05-01 NOTE — Telephone Encounter (Signed)
Caller name: Dimitria Relation to pt: self Call back number:  [phone] Pharmacy:  Reason for call: Pt called stating has an appt for CPE this afternoon today and would like to know if lab orders can be put in since she is fasting and wanted to know if she can come early to have labs done and then come in the afternoon for her CPE. Please Advise.

## 2016-05-03 ENCOUNTER — Telehealth: Payer: Self-pay | Admitting: Family Medicine

## 2016-05-03 LAB — URINE CULTURE

## 2016-05-03 NOTE — Telephone Encounter (Signed)
Caller name: Relationship to patient: Self Can be reached: [phone]  Pharmacy:  Reason for call: Patient request note for work for the date of her CPE. Also, states she is suppose to have a Rx called in as stated on My Chart following her labs

## 2016-05-04 ENCOUNTER — Other Ambulatory Visit: Payer: Self-pay

## 2016-05-04 DIAGNOSIS — N390 Urinary tract infection, site not specified: Secondary | ICD-10-CM

## 2016-05-04 MED ORDER — NITROFURANTOIN MONOHYD MACRO 100 MG PO CAPS: 100.0000 mg | ORAL_CAPSULE | Freq: Two times a day (BID) | ORAL | 0 refills | Status: DC

## 2016-05-04 MED FILL — NITROFURANTOIN MONO-MCR 100: 100 | 7 days supply | Qty: 14 | Fill #0

## 2016-05-04 NOTE — Telephone Encounter (Signed)
Spoke with pt about lab results, pt state she understand results, and new medication instruction. Pt state she need a letter for work from ov/annual physical Monday. Patient had no questions or concerns at this time and state she will drop off FMLA paperwork tomorrow, she also state she will pick up her letter for work. LB

## 2016-05-04 NOTE — Progress Notes (Signed)
New Rx sent to pharmacy. LB

## 2016-05-05 ENCOUNTER — Telehealth: Payer: Self-pay | Admitting: Family Medicine

## 2016-05-05 NOTE — Telephone Encounter (Signed)
[facility] [place] [facility] Please update chart per pt request.

## 2016-05-05 NOTE — Telephone Encounter (Signed)
Pt brought FMLA forms to be completed and faxed by deadline 05/18/16. Pt would like to also have a copy for herself. Pt ph# [phone].

## 2016-05-05 NOTE — Telephone Encounter (Signed)
[facility] Sent Rx to [facility] because pt was coming to drop off FMLA paperwork and to pick up letter for work. LB

## 2016-05-16 NOTE — Telephone Encounter (Signed)
Forms received, filled in as much as possible and forwarded to PCP for review and completion.

## 2016-05-17 IMAGING — DX DG KNEE STANDING AP BILAT
1 series · 1 of 1 positions shown · non-contrast
Comparison: None in PACs

CLINICAL DATA: Chronic bilateral knee pain without known injury

EXAM:
BILATERAL KNEES STANDING - 1 VIEW

[knee ap]
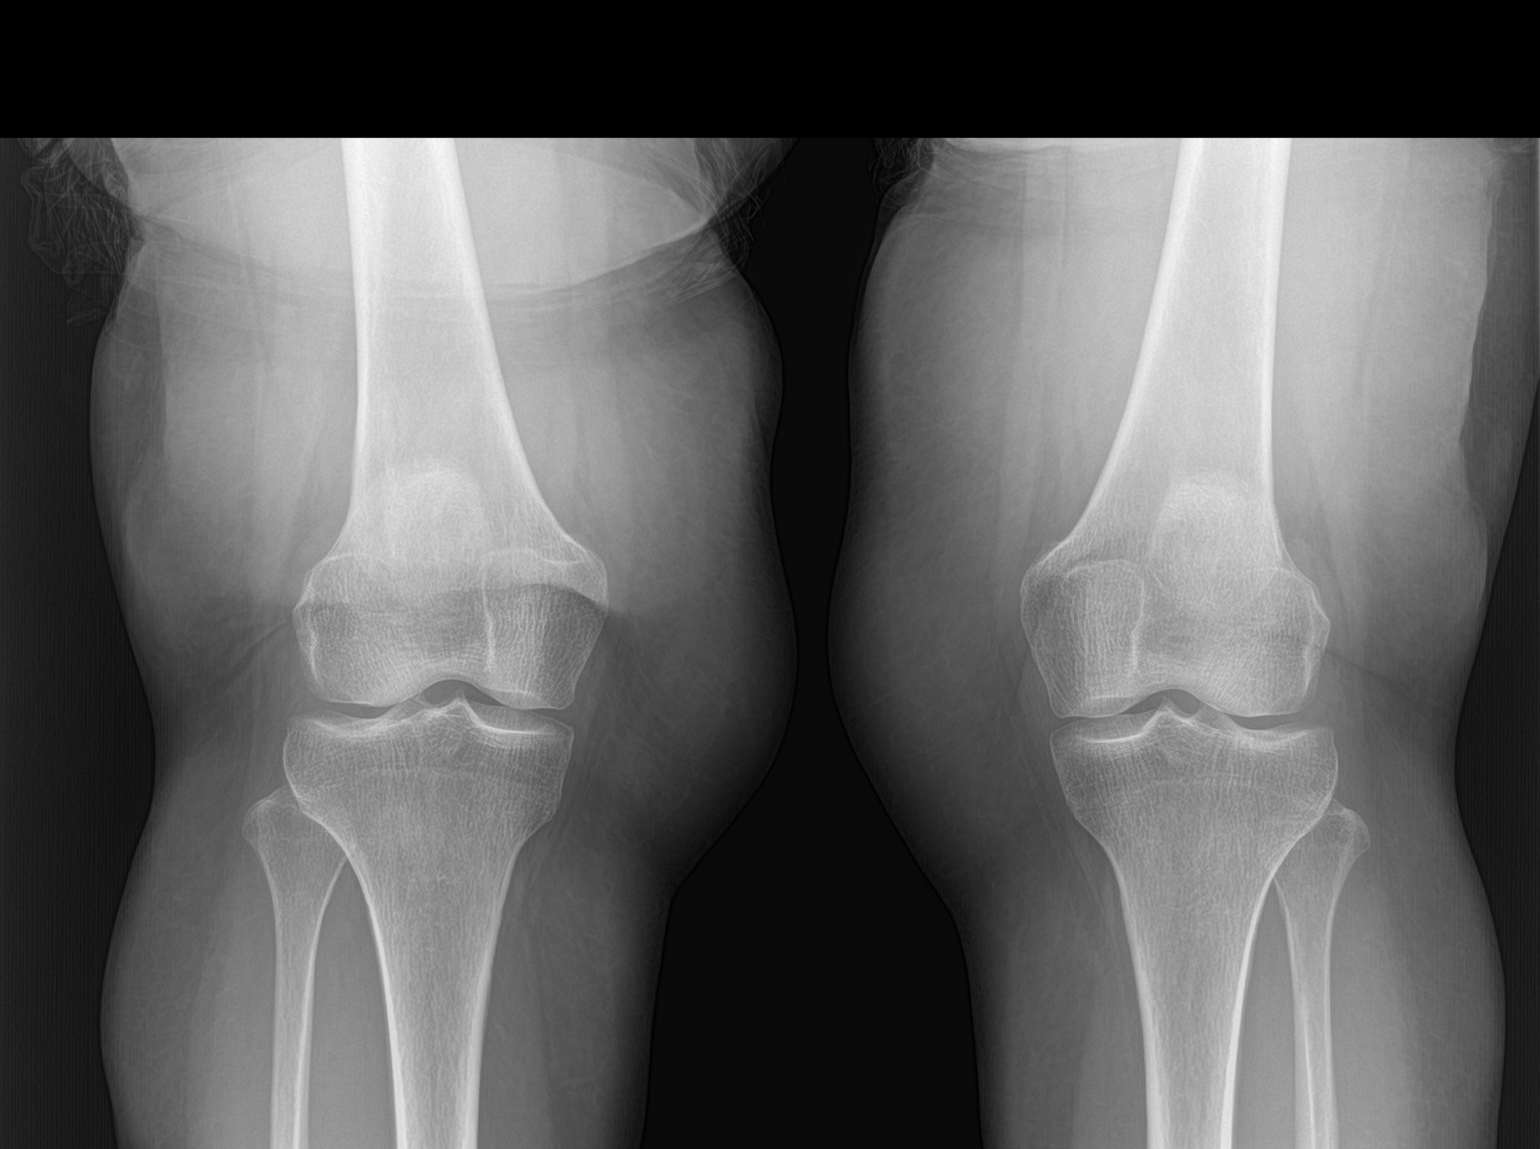

[1 of 1 positions shown; findings below may reference images not displayed]

FINDINGS: The bones are subjectively adequately mineralized. The medial and
lateral joint spaces are preserved. There is mild beaking of the
tibial spines bilaterally. There is no chondrocalcinosis. The
overlying soft tissues are unremarkable.
IMPRESSION: Mild degenerative change manifested by beaking of the tibial spines.
There is no medial or lateral joint space loss.

## 2016-05-18 NOTE — Telephone Encounter (Signed)
Form completed and faxed to [facility] @ [phone].

## 2016-05-24 NOTE — Telephone Encounter (Signed)
Fax confirmation was received. 

## 2016-06-01 NOTE — Telephone Encounter (Signed)
Done

## 2016-06-08 ENCOUNTER — Telehealth: Payer: Self-pay | Admitting: Family Medicine

## 2016-06-08 NOTE — Telephone Encounter (Signed)
Relation to pt: self  Call back number:[phone] Pharmacy:  Reason for call:  Patient states nitrofurantoin, macrocrystal-monohydrate, (MACROBID) 100 MG capsule caused yeast infection, requesting Rx

## 2016-06-08 NOTE — Telephone Encounter (Signed)
Diflucan 150 mg #2  1 po qd x1, may repeat in 3 days prn

## 2016-06-09 MED ORDER — FLUCONAZOLE 150 MG PO TABS: ORAL_TABLET | ORAL | 0 refills | Status: DC

## 2016-06-09 NOTE — Telephone Encounter (Signed)
Sent in and patient notified 

## 2016-07-03 ENCOUNTER — Other Ambulatory Visit: Payer: Self-pay | Admitting: Family Medicine

## 2016-07-03 DIAGNOSIS — E538 Deficiency of other specified B group vitamins: Secondary | ICD-10-CM

## 2016-07-27 ENCOUNTER — Telehealth: Payer: Self-pay | Admitting: Family Medicine

## 2016-07-27 DIAGNOSIS — E559 Vitamin D deficiency, unspecified: Secondary | ICD-10-CM

## 2016-07-27 DIAGNOSIS — E538 Deficiency of other specified B group vitamins: Secondary | ICD-10-CM

## 2016-07-27 NOTE — Telephone Encounter (Signed)
Orders placed.

## 2016-07-27 NOTE — Telephone Encounter (Signed)
Pt called to reschedule shingles 2nd shot. She is coming in tomorrow 07/28/16. She wants to do her repeat labs tomorrow too. No orders in but per notes pt to recheck vit d and vit b12. Please enter orders.

## 2016-07-28 ENCOUNTER — Telehealth: Payer: Self-pay | Admitting: Medical

## 2016-07-28 ENCOUNTER — Ambulatory Visit (INDEPENDENT_AMBULATORY_CARE_PROVIDER_SITE_OTHER): Payer: BLUE CROSS/BLUE SHIELD

## 2016-07-28 ENCOUNTER — Other Ambulatory Visit (INDEPENDENT_AMBULATORY_CARE_PROVIDER_SITE_OTHER): Payer: BLUE CROSS/BLUE SHIELD

## 2016-07-28 ENCOUNTER — Telehealth: Payer: Self-pay | Admitting: Family Medicine

## 2016-07-28 DIAGNOSIS — E559 Vitamin D deficiency, unspecified: Secondary | ICD-10-CM | POA: Diagnosis not present

## 2016-07-28 DIAGNOSIS — E538 Deficiency of other specified B group vitamins: Secondary | ICD-10-CM

## 2016-07-28 DIAGNOSIS — Z23 Encounter for immunization: Secondary | ICD-10-CM | POA: Diagnosis not present

## 2016-07-28 DIAGNOSIS — R350 Frequency of micturition: Secondary | ICD-10-CM

## 2016-07-28 LAB — VITAMIN B12: Vitamin B-12: 349 pg/mL (ref 211–911)

## 2016-07-28 LAB — VITAMIN D 25 HYDROXY (VIT D DEFICIENCY, FRACTURES): VITD: 32.5 ng/mL (ref 30.00–100.00)

## 2016-07-28 NOTE — Progress Notes (Signed)
Pre visit review using our clinic tool,if applicable. No additional management support is needed unless otherwise documented below in the visit note  Patient in for 2nd [employer] inmmunization. Given IM left deltoid. Patient tolerated well.

## 2016-07-28 NOTE — Telephone Encounter (Signed)
Opened to review 

## 2016-07-28 NOTE — Telephone Encounter (Signed)
Edward covering for Dr. Etter Sjogren today. Will forward.

## 2016-07-28 NOTE — Telephone Encounter (Signed)
Relation to pt: self  Call back number:[phone] Pharmacy: [id number] - [place], [place] - [address] [phone] (Phone) [phone] (Fax)   DOD:Dr. Larose Kells   Reason for call:  Patient states PCP aware of frequent UTI, patient states due to being sexually active triggered UTI symtopms frequent urination requesting trimethoprim (TRIMPEX) 100 MG tablet, patient states macrobid does not work, declined appt, please advise

## 2016-07-28 NOTE — Telephone Encounter (Signed)
I saw this request after hours.  Pt declined appointment offered by Dr .Larose Kells and I do think it is best not  to give antibiotic without getting the culture. I tried to call pt after hours to discuss but got no answer.   I wanted to offer her brief 3 days of cipro rather than the one she requested.(provided she insisted on antibiotic) and if she  refused recommendation of being seen at [facility].  I called her number but no answer. So will forward this to RN to call pt on Monday. I do think best for her to come in and give UA so we can get culture.

## 2016-07-31 NOTE — Telephone Encounter (Signed)
Per Dr. Etter Sjogren patient needs to at least drop off urine for culture.

## 2016-07-31 NOTE — Telephone Encounter (Signed)
Called patient and left a message for call back  

## 2016-07-31 NOTE — Telephone Encounter (Signed)
Patient returning call, advised RN will follow up tomorrow due to [facility] being offsite, patient voice understanding.

## 2016-08-01 ENCOUNTER — Other Ambulatory Visit: Payer: BLUE CROSS/BLUE SHIELD

## 2016-08-01 ENCOUNTER — Other Ambulatory Visit (INDEPENDENT_AMBULATORY_CARE_PROVIDER_SITE_OTHER): Payer: BLUE CROSS/BLUE SHIELD

## 2016-08-01 ENCOUNTER — Ambulatory Visit: Payer: BLUE CROSS/BLUE SHIELD | Admitting: Family Medicine

## 2016-08-01 DIAGNOSIS — R350 Frequency of micturition: Secondary | ICD-10-CM | POA: Diagnosis not present

## 2016-08-01 NOTE — Addendum Note (Signed)
Addended by: Rudene Anda on: 08/01/2016 08:20 AM   Modules accepted: Orders

## 2016-08-01 NOTE — Telephone Encounter (Addendum)
Pt placed on schedule.  Orders placed.

## 2016-08-01 NOTE — Telephone Encounter (Signed)
Called patient to discuss.  She will come in later today to drop off urine sample.

## 2016-08-01 NOTE — Telephone Encounter (Signed)
See other phone note.  Patient will be in to drop off urine.

## 2016-08-02 ENCOUNTER — Telehealth: Payer: Self-pay | Admitting: Medical

## 2016-08-02 ENCOUNTER — Other Ambulatory Visit: Payer: BLUE CROSS/BLUE SHIELD

## 2016-08-02 LAB — URINALYSIS, ROUTINE W REFLEX MICROSCOPIC
Bilirubin Urine: NEGATIVE
Hgb urine dipstick: NEGATIVE
Ketones, ur: NEGATIVE
Leukocytes, UA: NEGATIVE
Nitrite: POSITIVE — AB
PH: 6.5 (ref 5.0–8.0)
RBC / HPF: NONE SEEN (ref 0–?)
SPECIFIC GRAVITY, URINE: 1.01 (ref 1.000–1.030)
Total Protein, Urine: NEGATIVE
Urine Glucose: NEGATIVE
Urobilinogen, UA: 0.2 (ref 0.0–1.0)

## 2016-08-02 NOTE — Telephone Encounter (Signed)
I just pt urine back today and asked lab to culture. They stated to late to do culture? She has to come back in to give sample. See July 28, 2016 note. Look all the way to my comments.   So we can't get culture? I really would like one to be done before antibiotic given.   Please talk with me about this before you call her.

## 2016-08-03 ENCOUNTER — Telehealth: Payer: Self-pay | Admitting: Medical

## 2016-08-03 DIAGNOSIS — N390 Urinary tract infection, site not specified: Secondary | ICD-10-CM

## 2016-08-03 MED ORDER — TRIMETHOPRIM 100 MG PO TABS: 100.0000 mg | ORAL_TABLET | Freq: Every day | ORAL | 1 refills | Status: DC

## 2016-08-03 MED ORDER — SULFAMETHOXAZOLE-TRIMETHOPRIM 800-160 MG PO TABS: 1.0000 | ORAL_TABLET | Freq: Two times a day (BID) | ORAL | 0 refills | Status: DC

## 2016-08-03 NOTE — Telephone Encounter (Signed)
trimethaprim 100 mg #90  1po qd 1 refill

## 2016-08-03 NOTE — Telephone Encounter (Addendum)
Pt called back requesting a 90 day supply of antibiotic, preferably, trimethoprim.  She said she has recurrent UTIs and was prescribed trimethoprim before in the past to help.  Pt asked that message also be sent to Dr. Etter Sjogren since she's aware of this issue and prescribed the medication for her before in th past.    Please advise.

## 2016-08-03 NOTE — Telephone Encounter (Signed)
rx bactrim ds sent to pt pharmacy

## 2016-08-03 NOTE — Addendum Note (Signed)
Addended by: Rudene Anda on: 08/03/2016 02:58 PM   Modules accepted: Orders

## 2016-08-03 NOTE — Telephone Encounter (Addendum)
Rx sent to the pharmacy.  Left message on cell phone making patient aware.

## 2016-08-03 NOTE — Telephone Encounter (Signed)
Called patient.  Phone went straight to voice mail.  Left a detailed message on phone making her aware that urine was positive for UTI and bactrim antibiotic was sent to the pharmacy.  She was encouraged to call back with questions or concerns.

## 2016-08-04 ENCOUNTER — Telehealth: Payer: Self-pay | Admitting: Medical

## 2016-08-04 DIAGNOSIS — N390 Urinary tract infection, site not specified: Secondary | ICD-10-CM

## 2016-08-04 LAB — URINE CULTURE

## 2016-08-04 MED ORDER — NITROFURANTOIN MONOHYD MACRO 100 MG PO CAPS: 100.0000 mg | ORAL_CAPSULE | Freq: Two times a day (BID) | ORAL | 0 refills | Status: DC

## 2016-08-05 NOTE — Telephone Encounter (Signed)
Opened to review 

## 2016-08-31 ENCOUNTER — Other Ambulatory Visit: Payer: Self-pay | Admitting: Family Medicine

## 2016-08-31 NOTE — Telephone Encounter (Signed)
Refill done.  

## 2016-10-02 ENCOUNTER — Encounter: Payer: Self-pay | Admitting: Family Medicine

## 2016-10-02 ENCOUNTER — Telehealth: Payer: Self-pay | Admitting: Family Medicine

## 2016-10-02 ENCOUNTER — Ambulatory Visit (INDEPENDENT_AMBULATORY_CARE_PROVIDER_SITE_OTHER): Payer: BLUE CROSS/BLUE SHIELD | Admitting: Family Medicine

## 2016-10-02 VITALS — BP 110/74 | HR 68 | Temp 98.2°F | Ht 62.0 in | Wt 126.6 lb

## 2016-10-02 DIAGNOSIS — R42 Dizziness and giddiness: Secondary | ICD-10-CM

## 2016-10-02 DIAGNOSIS — J069 Acute upper respiratory infection, unspecified: Secondary | ICD-10-CM

## 2016-10-02 LAB — POC URINALSYSI DIPSTICK (AUTOMATED)
BILIRUBIN UA: NEGATIVE
Blood, UA: NEGATIVE
GLUCOSE UA: NEGATIVE
Ketones, UA: NEGATIVE
LEUKOCYTES UA: NEGATIVE
NITRITE UA: NEGATIVE
Protein, UA: NEGATIVE
Spec Grav, UA: >=1.03 — AB (ref 1.010–1.025)
Urobilinogen, UA: 0.2 E.U./dL
pH, UA: 6 (ref 5.0–8.0)

## 2016-10-02 LAB — CBC WITH DIFFERENTIAL/PLATELET
BASOS ABS: 0.1 10*3/uL (ref 0.0–0.1)
Basophils Relative: 1 % (ref 0.0–3.0)
Eosinophils Absolute: 0.2 10*3/uL (ref 0.0–0.7)
Eosinophils Relative: 2.8 % (ref 0.0–5.0)
HCT: 39.7 % (ref 36.0–46.0)
Hemoglobin: 12.9 g/dL (ref 12.0–15.0)
LYMPHS ABS: 1.4 10*3/uL (ref 0.7–4.0)
Lymphocytes Relative: 24.7 % (ref 12.0–46.0)
MCHC: 32.5 g/dL (ref 30.0–36.0)
MCV: 88 fl (ref 78.0–100.0)
MONOS PCT: 5.9 % (ref 3.0–12.0)
Monocytes Absolute: 0.3 10*3/uL (ref 0.1–1.0)
NEUTROS ABS: 3.7 10*3/uL (ref 1.4–7.7)
NEUTROS PCT: 65.6 % (ref 43.0–77.0)
PLATELETS: 235 10*3/uL (ref 150.0–400.0)
RBC: 4.51 Mil/uL (ref 3.87–5.11)
RDW: 15.6 % — ABNORMAL HIGH (ref 11.5–15.5)
WBC: 5.6 10*3/uL (ref 4.0–10.5)

## 2016-10-02 LAB — COMPREHENSIVE METABOLIC PANEL
ALT: 15 U/L (ref 0–35)
AST: 21 U/L (ref 0–37)
Albumin: 4 g/dL (ref 3.5–5.2)
Alkaline Phosphatase: 41 U/L (ref 39–117)
BUN: 10 mg/dL (ref 6–23)
CO2: 27 meq/L (ref 19–32)
Calcium: 9.4 mg/dL (ref 8.4–10.5)
Chloride: 105 mEq/L (ref 96–112)
Creatinine, Ser: 0.74 mg/dL (ref 0.40–1.20)
GFR: 87.77 mL/min (ref >60.00)
GLUCOSE: 94 mg/dL (ref 70–99)
POTASSIUM: 5.1 meq/L (ref 3.5–5.1)
Sodium: 140 mEq/L (ref 135–145)
Total Bilirubin: 0.5 mg/dL (ref 0.2–1.2)
Total Protein: 6.6 g/dL (ref 6.0–8.3)

## 2016-10-02 LAB — TSH: TSH: 2.83 u[IU]/mL (ref 0.35–4.50)

## 2016-10-02 LAB — VITAMIN B12: VITAMIN B 12: 357 pg/mL (ref 211–911)

## 2016-10-02 NOTE — Progress Notes (Signed)
Patient ID: [name], female    DOB: 10-31-1965  Age: 51 y.o. MRN: [id number]    Subjective:  Subjective  HPI [name] presents for dizziness for about 2 weeks.  She feels like the room was spinning. No NV-- her IBS has bee acting up more than usual.  She has felt warm/ hot but has not taken temp.  She works in an [employer] but stands all day.  Her bp did get high Thursday/ Friday at work but came down with rest.  She later said she did throw up 2 that thurs/ Friday as well.  She said she still was light headed over the weekend.  No loc, no cp, no palpitations.   Pt states she feels like she has fluid in her R ear.  No nasal congestion   Review of Systems  Constitutional: Negative for appetite change, diaphoresis, fatigue and unexpected weight change.  Eyes: Negative for pain, redness and visual disturbance.  Respiratory: Negative for cough, chest tightness, shortness of breath and wheezing.   Cardiovascular: Negative for chest pain, palpitations and leg swelling.  Endocrine: Negative for cold intolerance, heat intolerance, polydipsia, polyphagia and polyuria.  Genitourinary: Negative for difficulty urinating, dysuria and frequency.  Neurological: Positive for dizziness and light-headedness. Negative for numbness and headaches.    History Past Medical History:  Diagnosis Date  . Allergy   . Anemia   . Anxiety   . Asthma   . Depression   . GERD (gastroesophageal reflux disease)   . Heart murmur     She has a past surgical history that includes Abdominal hysterectomy; Gastric bypass; and Tubal ligation.   Her family history includes Cancer in her father, maternal grandfather, and paternal grandfather; Depression in her brother and sister; Diabetes in her maternal grandfather, maternal grandmother, paternal grandfather, and paternal grandmother; Hypertension in her father; Liver disease in her maternal grandfather; Stroke in her mother.She reports that she quit  smoking about 29 years ago. She has never used smokeless tobacco. She reports that she drinks alcohol. She reports that she does not use drugs.  Current Outpatient Prescriptions on File Prior to Visit  Medication Sig Dispense Refill  . ALPRAZolam (XANAX) 1 MG tablet     . Cholecalciferol (VITAMIN D3) 2000 units capsule Take 1 capsule (2,000 Units total) by mouth daily. 30 capsule 1  . cyanocobalamin (,VITAMIN B-12,) 1000 MCG/ML injection INJECT 1 ML INTO THE MUSCLE ONCE A WEEK. FOR 4 WEEKS 4 mL 3  . cyclobenzaprine (FLEXERIL) 10 MG tablet Take 1 tablet (10 mg total) by mouth 3 (three) times daily as needed. 30 tablet 0  . dextroamphetamine (DEXTROSTAT) 10 MG tablet Take 10 mg by mouth 3 (three) times daily.    Marland Kitchen Fe Cbn-Fe Gluc-FA-B12-C-DSS (FERRALET 90) 90-1 MG TABS TAKE 1 TABLET BY MOUTH EVERY DAY 30 each 11  . fluconazole (DIFLUCAN) 150 MG tablet Take 1 by mouth once, then repeat in 3 days if needed. 2 tablet 0  . fluticasone (FLONASE) 50 MCG/ACT nasal spray PLACE 2 SPRAYS INTO BOTH NOSTRILS DAILY. 16 g 5  . gabapentin (NEURONTIN) 100 MG capsule TAKE 2 CAPSULES (200 MG TOTAL) BY MOUTH AT BEDTIME. 180 capsule 1  . loperamide (ANTI-DIARRHEAL) 2 MG capsule Take 2 mg by mouth 3 (three) times daily.    . pantoprazole (PROTONIX) 20 MG tablet Take 20 mg by mouth daily.  3  . rOPINIRole (REQUIP) 1 MG tablet TAKE 1 TABLET (1 MG TOTAL) BY MOUTH AT BEDTIME. 90 tablet  3  . sertraline (ZOLOFT) 100 MG tablet Take 3 tablets by mouth daily.    . SYRINGE-NEEDLE, DISP, 3 ML (B-D 3CC LUER-LOK SYR 25GX5/8") 25G X 5/8" 3 ML MISC USE AS DIRECTED WITH B-12 INJECTION 16 each 3  . trimethoprim (TRIMPEX) 100 MG tablet Take 1 tablet (100 mg total) by mouth daily. 90 tablet 1  . TRINTELLIX 10 MG TABS     . vitamin B-12 (CYANOCOBALAMIN) 1000 MCG tablet Take 1,000 mcg by mouth daily.      . Vitamin D, Ergocalciferol, (DRISDOL) 50000 units CAPS capsule Take 1 capsule (50,000 Units total) by mouth every 7 (seven) days. 4  capsule 2  . promethazine-dextromethorphan (PROMETHAZINE-DM) 6.25-15 MG/5ML syrup Take 5 mLs by mouth 4 (four) times daily as needed. (Patient not taking: Reported on 10/02/2016) 118 mL 0  . [DISCONTINUED] Azelastine-Fluticasone (DYMISTA) 137-50 MCG/ACT SUSP Place 1 spray into the nose 2 (two) times daily.    . [DISCONTINUED] loratadine (CLARITIN) 10 MG tablet Take 10 mg by mouth daily.      . [DISCONTINUED] montelukast (SINGULAIR) 10 MG tablet Take 10 mg by mouth at bedtime.      . [DISCONTINUED] RABEprazole (ACIPHEX) 20 MG tablet Take 20 mg by mouth daily.      . [DISCONTINUED] traZODone (DESYREL) 100 MG tablet Take 100 mg by mouth at bedtime.       No current facility-administered medications on file prior to visit.      Objective:  Objective  Physical Exam  Constitutional: She is oriented to person, place, and time. She appears well-developed and well-nourished.  HENT:  Head: Normocephalic and atraumatic.  Right Ear: A middle ear effusion is present. Decreased hearing is noted.  Nose: Nose normal. Right sinus exhibits no maxillary sinus tenderness and no frontal sinus tenderness. Left sinus exhibits no maxillary sinus tenderness and no frontal sinus tenderness.  Eyes: Conjunctivae and EOM are normal.  Neck: Normal range of motion. Neck supple. No JVD present. Carotid bruit is not present. No thyromegaly present.  Cardiovascular: Normal rate, regular rhythm and normal heart sounds.   No murmur heard. Pulmonary/Chest: Effort normal and breath sounds normal. No respiratory distress. She has no wheezes. She has no rales. She exhibits no tenderness.  Musculoskeletal: She exhibits no edema.  Neurological: She is alert and oriented to person, place, and time. She has normal reflexes. She displays normal reflexes. No cranial nerve deficit. She exhibits normal muscle tone. Coordination normal.  Skin: Skin is warm and dry.  Psychiatric: She has a normal mood and affect. Her behavior is normal.  Judgment and thought content normal.  Nursing note and vitals reviewed.  BP 110/74 (BP Location: Left Arm, Patient Position: Sitting, Cuff Size: Normal)   Pulse 68   Temp 98.2 F (36.8 C) (Oral)   Ht 5\' 2"  (1.575 m)   Wt 126 lb 9.6 oz (57.4 kg)   SpO2 99%   BMI 23.16 kg/m  Wt Readings from Last 3 Encounters:  10/02/16 126 lb 9.6 oz (57.4 kg)  05/01/16 128 lb (58.1 kg)  03/21/16 129 lb 9.6 oz (58.8 kg)     Lab Results  Component Value Date   WBC 5.1 05/01/2016   HGB 13.2 05/01/2016   HCT 40.0 05/01/2016   PLT 247.0 05/01/2016   GLUCOSE 100 (H) 05/01/2016   CHOL 163 05/01/2016   TRIG 93.0 05/01/2016   HDL 71.10 05/01/2016   LDLCALC 73 05/01/2016   ALT 13 05/01/2016   AST 19 05/01/2016   NA  139 05/01/2016   K 4.4 05/01/2016   CL 106 05/01/2016   CREATININE 0.67 05/01/2016   BUN 12 05/01/2016   CO2 28 05/01/2016   TSH 2.46 05/01/2016    Dg Chest 2 View  Result Date: 03/21/2016 CLINICAL DATA:  Diagnosed with influenza. Patient has cough, fever, and body aches for the past 3 days. History of asthma, former smoker. EXAM: CHEST  2 VIEW COMPARISON:  PA and lateral chest x-ray of February 13, 2006 FINDINGS: The lungs are well-expanded. There is no focal infiltrate. There is no pleural effusion. The heart and pulmonary vascularity are normal. The mediastinum is normal in width. There is no pleural effusion. The bony thorax exhibits no acute abnormality. IMPRESSION: There is no pneumonia nor other acute cardiopulmonary abnormality. Electronically Signed   By: David  Martinique M.D.   On: 03/21/2016 12:03     Assessment & Plan:  Plan  I have discontinued Ms. Popper's CARAFATE, Diclofenac Sodium, neomycin-polymyxin-hydrocortisone, oseltamivir, nitrofurantoin (macrocrystal-monohydrate), and nitrofurantoin (macrocrystal-monohydrate). I am also having her maintain her vitamin B-12, dextroamphetamine, loperamide, ALPRAZolam, pantoprazole, sertraline, rOPINIRole, FERRALET 90, SYRINGE-NEEDLE  (DISP) 3 ML, TRINTELLIX, promethazine-dextromethorphan, fluticasone, cyclobenzaprine, Vitamin D (Ergocalciferol), Vitamin D3, fluconazole, cyanocobalamin, trimethoprim, and gabapentin.  No orders of the defined types were placed in this encounter.   Problem List Items Addressed This Visit      Unprioritized   URI (upper respiratory infection)    flonase Antihistamine This may be the cause of some of the dizziness Consider ent if no better       Other Visit Diagnoses    Dizzy    -  Primary   Relevant Orders   EKG 12-Lead (Completed)   Vitamin B12   TSH   CBC with Differential/Platelet   Comprehensive metabolic panel   POCT Urinalysis Dipstick (Automated) (Completed)   Vitamin D 1,25 dihydroxy           If symptoms worsen go to er      Follow-up: No Follow-up on file.  [name], DO

## 2016-10-02 NOTE — Telephone Encounter (Signed)
Relation to pt: self Call back number:[phone]   Reason for call:  Patient would like to be notified when forms are received, [place] faxed over short term disability/fmla forms to  [phone]  Attention Dr. Etter Sjogren  Claim # [id number]

## 2016-10-02 NOTE — Patient Instructions (Addendum)
Dizziness Dizziness is a common problem. It is a feeling of unsteadiness or light-headedness. You may feel like you are about to faint. Dizziness can lead to injury if you stumble or fall. Anyone can become dizzy, but dizziness is more common in older adults. This condition can be caused by a number of things, including medicines, dehydration, or illness. Follow these instructions at home: Taking these steps may help with your condition: Eating and drinking  Drink enough fluid to keep your urine clear or pale yellow. This helps to keep you from becoming dehydrated. Try to drink more clear fluids, such as water.  Do not drink alcohol.  Limit your caffeine intake if directed by your health care provider.  Limit your salt intake if directed by your health care provider. Activity  Avoid making quick movements. ? Rise slowly from chairs and steady yourself until you feel okay. ? In the morning, first sit up on the side of the bed. When you feel okay, stand slowly while you hold onto something until you know that your balance is fine.  Move your legs often if you need to stand in one place for a long time. Tighten and relax your muscles in your legs while you are standing.  Do not drive or operate heavy machinery if you feel dizzy.  Avoid bending down if you feel dizzy. Place items in your home so that they are easy for you to reach without leaning over. Lifestyle  Do not use any tobacco products, including cigarettes, chewing tobacco, or electronic cigarettes. If you need help quitting, ask your health care provider.  Try to reduce your stress level, such as with yoga or meditation. Talk with your health care provider if you need help. General instructions  Watch your dizziness for any changes.  Take medicines only as directed by your health care provider. Talk with your health care provider if you think that your dizziness is caused by a medicine that you are taking.  Tell a friend or  a family member that you are feeling dizzy. If he or she notices any changes in your behavior, have this person call your health care provider.  Keep all follow-up visits as directed by your health care provider. This is important. Contact a health care provider if:  Your dizziness does not go away.  Your dizziness or light-headedness gets worse.  You feel nauseous.  You have reduced hearing.  You have new symptoms.  You are unsteady on your feet or you feel like the room is spinning. Get help right away if:  You vomit or have diarrhea and are unable to eat or drink anything.  You have problems talking, walking, swallowing, or using your arms, hands, or legs.  You feel generally weak.  You are not thinking clearly or you have trouble forming sentences. It may take a friend or family member to notice this.  You have chest pain, abdominal pain, shortness of breath, or sweating.  Your vision changes.  You notice any bleeding.  You have a headache.  You have neck pain or a stiff neck.  You have a fever. This information is not intended to replace advice given to you by your health care provider. Make sure you discuss any questions you have with your health care provider. Document Released: 07/05/2000 Document Revised: 06/17/2015 Document Reviewed: 01/05/2014 Elsevier Interactive Patient Education  2017 [employer]  Orthostatic Hypotension Orthostatic hypotension is a sudden drop in blood pressure that happens when you quickly change  positions, such as when you get up from a seated or lying position. Blood pressure is a measurement of how strongly, or weakly, your blood is pressing against the walls of your arteries. Arteries are blood vessels that carry blood from your heart throughout your body. When blood pressure is too low, you may not get enough blood to your brain or to the rest of your organs. This can cause weakness, light-headedness, rapid heartbeat, and fainting.  This can last for just a few seconds or for up to a few minutes. Orthostatic hypotension is usually not a serious problem. However, if it happens frequently or gets worse, it may be a sign of something more serious. What are the causes? This condition may be caused by:  Sudden changes in posture, such as standing up quickly after you have been sitting or lying down.  Blood loss.  Loss of body fluids (dehydration).  Heart problems.  Hormone (endocrine) problems.  Pregnancy.  Severe infection.  Lack of certain nutrients.  Severe allergic reactions (anaphylaxis).  Certain medicines, such as blood pressure medicine or medicines that make the body lose excess fluids (diuretics). Sometimes, this condition can be caused by not taking medicine as directed, such as taking too much of a certain medicine.  What increases the risk? Certain factors can make you more likely to develop orthostatic hypotension, including:  Age. Risk increases as you get older.  Conditions that affect the heart or the central nervous system.  Taking certain medicines, such as blood pressure medicine or diuretics.  Being pregnant.  What are the signs or symptoms? Symptoms of this condition may include:  Weakness.  Light-headedness.  Dizziness.  Blurred vision.  Fatigue.  Rapid heartbeat.  Fainting, in severe cases.  How is this diagnosed? This condition is diagnosed based on:  Your medical history.  Your symptoms.  Your blood pressure measurement. Your health care provider will check your blood pressure when you are: ? Lying down. ? Sitting. ? Standing.  A blood pressure reading is recorded as two numbers, such as "120 over 80" (or 120/80). The first ("top") number is called the systolic pressure. It is a measure of the pressure in your arteries as your heart beats. The second ("bottom") number is called the diastolic pressure. It is a measure of the pressure in your arteries when your  heart relaxes between beats. Blood pressure is measured in a unit called mm Hg. Healthy blood pressure for adults is 120/80. If your blood pressure is below 90/60, you may be diagnosed with hypotension. Other information or tests that may be used to diagnose orthostatic hypotension include:  Your other vital signs, such as your heart rate and temperature.  Blood tests.  Tilt table test. For this test, you will be safely secured to a table that moves you from a lying position to an upright position. Your heart rhythm and blood pressure will be monitored during the test.  How is this treated? Treatment for this condition may include:  Changing your diet. This may involve eating more salt (sodium) or drinking more water.  Taking medicines to raise your blood pressure.  Changing the dosage of certain medicines you are taking that might be lowering your blood pressure.  Wearing compression stockings. These stockings help to prevent blood clots and reduce swelling in your legs.  In some cases, you may need to go to the hospital for:  Fluid replacement. This means you will receive fluids through an IV tube.  Blood replacement. This  means you will receive donated blood through an IV tube (transfusion).  Treating an infection or heart problems, if this applies.  Monitoring. You may need to be monitored while medicines that you are taking wear off.  Follow these instructions at home: Eating and drinking   Drink enough fluid to keep your urine clear or pale yellow.  Eat a healthy diet and follow instructions from your health care provider about eating or drinking restrictions. A healthy diet includes: ? Fresh fruits and vegetables. ? Whole grains. ? Lean meats. ? Low-fat dairy products.  Eat extra salt only as directed. Do not add extra salt to your diet unless your health care provider told you to do that.  Eat frequent, small meals.  Avoid standing up suddenly after  eating. Medicines  Take over-the-counter and prescription medicines only as told by your health care provider. ? Follow instructions from your health care provider about changing the dosage of your current medicines, if this applies. ? Do not stop or adjust any of your medicines on your own. General instructions  Wear compression stockings as told by your health care provider.  Get up slowly from lying down or sitting positions. This gives your blood pressure a chance to adjust.  Avoid hot showers and excessive heat as directed by your health care provider.  Return to your normal activities as told by your health care provider. Ask your health care provider what activities are safe for you.  Do not use any products that contain nicotine or tobacco, such as cigarettes and e-cigarettes. If you need help quitting, ask your health care provider.  Keep all follow-up visits as told by your health care provider. This is important. Contact a health care provider if:  You vomit.  You have diarrhea.  You have a fever for more than 2-3 days.  You feel more thirsty than usual.  You feel weak and tired. Get help right away if:  You have chest pain.  You have a fast or irregular heartbeat.  You develop numbness in any part of your body.  You cannot move your arms or your legs.  You have trouble speaking.  You become sweaty or feel lightheaded.  You faint.  You feel short of breath.  You have trouble staying awake.  You feel confused. This information is not intended to replace advice given to you by your health care provider. Make sure you discuss any questions you have with your health care provider. Document Released: 12/30/2001 Document Revised: 09/28/2015 Document Reviewed: 07/02/2015 Elsevier Interactive Patient Education  2018 [employer].

## 2016-10-02 NOTE — Assessment & Plan Note (Signed)
flonase Antihistamine This may be the cause of some of the dizziness Consider ent if no better

## 2016-10-03 NOTE — Telephone Encounter (Signed)
[name] would be the one who got them

## 2016-10-03 NOTE — Telephone Encounter (Signed)
Dr. Etter Sjogren, no one has given me this form, do you remember ever receiving a form for this pt

## 2016-10-04 LAB — VITAMIN D 1,25 DIHYDROXY
VITAMIN D3 1, 25 (OH): 82 pg/mL
Vitamin D 1, 25 (OH)2 Total: 82 pg/mL — ABNORMAL HIGH (ref 18–72)
Vitamin D2 1, 25 (OH)2: <8 pg/mL

## 2016-10-04 NOTE — Telephone Encounter (Signed)
Spoke with pt and she states she will have them to re-fax to the [phone] fax machine

## 2016-10-04 NOTE — Telephone Encounter (Signed)
Have not seen any incoming paperwork for patient as of yet/SLS 09/12

## 2016-10-09 ENCOUNTER — Telehealth: Payer: Self-pay | Admitting: Family Medicine

## 2016-10-09 ENCOUNTER — Encounter: Payer: Self-pay | Admitting: Family Medicine

## 2016-10-09 NOTE — Telephone Encounter (Signed)
Called left message to call back 

## 2016-10-09 NOTE — Telephone Encounter (Signed)
If she is drinking enough fluids she should be feeling better  We can refer to ent Ok to give note for this week

## 2016-10-09 NOTE — Telephone Encounter (Signed)
No, I'm sorry, I have not received any forms for patient/SLS 09/17

## 2016-10-09 NOTE — Telephone Encounter (Signed)
Pt states her ears feel like it has cleared up some, she states the congestion has cleared up. Pt wanted to know how long do you expect her to be rehydrated, she states she has had gastric bypass and makes her feel more faster. She wanted to know also can she stay out of work another week because she is concerned about being dizzy at work.

## 2016-10-09 NOTE — Telephone Encounter (Signed)
Completed letter as instructed. Patient contacted. She states she is staying well hydrated and should be in the end of the week be better, if not will call back for a ENT referral

## 2016-10-09 NOTE — Telephone Encounter (Signed)
Did you ever receive forms for this pt

## 2016-10-09 NOTE — Telephone Encounter (Signed)
Letter at the front desk for pick up.

## 2016-10-09 NOTE — Telephone Encounter (Signed)
Dr. Etter Sjogren  Pt returned call and states she saw all her labs were normal but pt is concerned because she is still having dizzy spells and that she had it twice this morning. She states she has been trying to keep herself well hydrated and does not feel comfortable returning to work and working around equipment and she is dizzy. She does not know what to do

## 2016-10-09 NOTE — Telephone Encounter (Signed)
Spoke with pt and she is concerned because this was faxed several times and she is unsure as to what she should do as far as getting this to [place]. She states she has faxed to both fax machines. I have checked my paperwork and I do not have these forms either

## 2016-10-09 NOTE — Telephone Encounter (Signed)
Are her ears/ congestion still a problem?  If yes we can refer to ent to see if that is the problem From there we could try neuro

## 2016-10-10 NOTE — Telephone Encounter (Signed)
Disability forms received on Clinic Fax from [facility]; will complete as much as possible and forwarded to provider/SLS 09/17

## 2016-10-13 NOTE — Telephone Encounter (Signed)
Pt called in to follow up on form, I advised of note placed in chart. Pt says that she spoke with them at around 11 today and they stated that they hadn't received forms. Pt would like to know if office could refax for her. Pt says if not received she will not get paid.

## 2016-10-13 NOTE — Telephone Encounter (Signed)
Received incomplete forms from PCP; added from chart what I could locate; faxed to [employer]/SLS 09/21

## 2016-10-13 NOTE — Telephone Encounter (Signed)
Forms resent via fax to [employer] at [phone]/SLS 09/21

## 2016-10-17 NOTE — Telephone Encounter (Addendum)
Relation to pt: self  Call back number:[phone]   Reason for call:  [facility] received forms stating date patient went out from work was missing and it needs to indicate 10/02/16 and the date patient returned to work needs to indicate  10/17/16 along with supporting office notes, please advise

## 2016-10-19 NOTE — Telephone Encounter (Signed)
Received copies of original paperwork from [facility]; entered dates asked for and resent via fax to [phone] [& original [phone], along with 10/02/16 OV notes, total 26 pages sent/SLS 09/27

## 2017-02-26 ENCOUNTER — Ambulatory Visit (INDEPENDENT_AMBULATORY_CARE_PROVIDER_SITE_OTHER): Payer: BLUE CROSS/BLUE SHIELD | Admitting: Family Medicine

## 2017-02-26 DIAGNOSIS — Z23 Encounter for immunization: Secondary | ICD-10-CM | POA: Diagnosis not present

## 2017-02-26 NOTE — Progress Notes (Signed)
Mary Lauman R Lowne Chase, DO 

## 2017-02-27 ENCOUNTER — Ambulatory Visit: Payer: BLUE CROSS/BLUE SHIELD

## 2017-03-10 DIAGNOSIS — F900 Attention-deficit hyperactivity disorder, predominantly inattentive type: Secondary | ICD-10-CM | POA: Diagnosis not present

## 2017-03-10 DIAGNOSIS — F410 Panic disorder [episodic paroxysmal anxiety] without agoraphobia: Secondary | ICD-10-CM | POA: Diagnosis not present

## 2017-03-10 DIAGNOSIS — F331 Major depressive disorder, recurrent, moderate: Secondary | ICD-10-CM | POA: Diagnosis not present

## 2017-04-20 ENCOUNTER — Encounter: Payer: Self-pay | Admitting: Family Medicine

## 2017-04-20 ENCOUNTER — Ambulatory Visit (INDEPENDENT_AMBULATORY_CARE_PROVIDER_SITE_OTHER): Payer: BLUE CROSS/BLUE SHIELD | Admitting: Family Medicine

## 2017-04-20 VITALS — BP 136/80 | HR 76 | Temp 97.6°F | Resp 16 | Ht 62.0 in | Wt 135.4 lb

## 2017-04-20 DIAGNOSIS — J0110 Acute frontal sinusitis, unspecified: Secondary | ICD-10-CM

## 2017-04-20 DIAGNOSIS — G8929 Other chronic pain: Secondary | ICD-10-CM | POA: Diagnosis not present

## 2017-04-20 DIAGNOSIS — M25561 Pain in right knee: Secondary | ICD-10-CM

## 2017-04-20 DIAGNOSIS — M62838 Other muscle spasm: Secondary | ICD-10-CM | POA: Diagnosis not present

## 2017-04-20 MED ORDER — CYCLOBENZAPRINE HCL 10 MG PO TABS: 10.0000 mg | ORAL_TABLET | Freq: Three times a day (TID) | ORAL | 0 refills | Status: DC | PRN

## 2017-04-20 MED ORDER — FLUTICASONE PROPIONATE 50 MCG/ACT NA SUSP: 2.0000 | Freq: Every day | NASAL | 5 refills | Status: DC

## 2017-04-20 NOTE — Progress Notes (Signed)
Patient ID: [name], female   DOB: May 15, 1965, 52 y.o.   MRN: [id number]    Subjective:  I acted as a Education administrator for Dr. Carollee Herter.  Guerry Bruin, [place]   Patient ID: [name], female    DOB: 03/18/1965, 52 y.o.   MRN: [id number]  Chief Complaint  Patient presents with  . Knee Pain    Knee Pain      Patient is in today for right knee pain.  No known injury.  She has seen Dr Tamala Julian and got a brace and pensaid --- which helped but then this week she was on the floor doing something and now at work her knee hurts all the time and when she squats   Pt also c/o tight trap on R ----  Has trouble lifting her head off pillow secondary to pain--no injury  Patient Care Team: Carollee Herter, Alferd Apa, DO as PCP - General   Past Medical History:  Diagnosis Date  . Allergy   . Anemia   . Anxiety   . Asthma   . Depression   . GERD (gastroesophageal reflux disease)   . Heart murmur     Past Surgical History:  Procedure Laterality Date  . ABDOMINAL HYSTERECTOMY     fibroids  . GASTRIC BYPASS    . TUBAL LIGATION      Family History  Problem Relation Age of Onset  . Hypertension Father   . Cancer Father        Prostate  . Depression Sister   . Depression Brother   . Diabetes Maternal Grandmother   . Diabetes Maternal Grandfather   . Liver disease Maternal Grandfather   . Cancer Maternal Grandfather        Prostate  . Diabetes Paternal Grandmother   . Diabetes Paternal Grandfather   . Cancer Paternal Grandfather        Lung, Prostate  . Stroke Mother     Social History   Socioeconomic History  . Marital status: Married    Spouse name: Not on file  . Number of children: Not on file  . Years of education: Not on file  . Highest education level: Not on file  Occupational History    Employer: [employer]    Comment: [employer]  Social Needs  . Financial resource strain: Not on file  . Food insecurity:    Worry: Not on file    Inability: Not on file  .  Transportation needs:    Medical: Not on file    Non-medical: Not on file  Tobacco Use  . Smoking status: Former Smoker    Last attempt to quit: 01/24/1987    Years since quitting: 30.2  . Smokeless tobacco: Never Used  Substance and Sexual Activity  . Alcohol use: Yes    Comment: occasionally  . Drug use: No  . Sexual activity: Not Currently  Lifestyle  . Physical activity:    Days per week: Not on file    Minutes per session: Not on file  . Stress: Not on file  Relationships  . Social connections:    Talks on phone: Not on file    Gets together: Not on file    Attends religious service: Not on file    Active member of club or organization: Not on file    Attends meetings of clubs or organizations: Not on file    Relationship status: Not on file  . Intimate partner violence:    Fear of current or ex  partner: Not on file    Emotionally abused: Not on file    Physically abused: Not on file    Forced sexual activity: Not on file  Other Topics Concern  . Not on file  Social History Narrative   Regular exercise--yes    Outpatient Medications Prior to Visit  Medication Sig Dispense Refill  . ALPRAZolam (XANAX) 1 MG tablet     . Cholecalciferol (VITAMIN D3) 2000 units capsule Take 1 capsule (2,000 Units total) by mouth daily. 30 capsule 1  . cyanocobalamin (,VITAMIN B-12,) 1000 MCG/ML injection INJECT 1 ML INTO THE MUSCLE ONCE A WEEK. FOR 4 WEEKS 4 mL 3  . cyclobenzaprine (FLEXERIL) 10 MG tablet Take 1 tablet (10 mg total) by mouth 3 (three) times daily as needed. 30 tablet 0  . dextroamphetamine (DEXTROSTAT) 10 MG tablet Take 10 mg by mouth 3 (three) times daily.    Marland Kitchen Fe Cbn-Fe Gluc-FA-B12-C-DSS (FERRALET 90) 90-1 MG TABS TAKE 1 TABLET BY MOUTH EVERY DAY 30 each 11  . gabapentin (NEURONTIN) 100 MG capsule TAKE 2 CAPSULES (200 MG TOTAL) BY MOUTH AT BEDTIME. 180 capsule 1  . loperamide (ANTI-DIARRHEAL) 2 MG capsule Take 2 mg by mouth 3 (three) times daily.    . pantoprazole  (PROTONIX) 20 MG tablet Take 20 mg by mouth daily.  3  . promethazine-dextromethorphan (PROMETHAZINE-DM) 6.25-15 MG/5ML syrup Take 5 mLs by mouth 4 (four) times daily as needed. 118 mL 0  . rOPINIRole (REQUIP) 1 MG tablet TAKE 1 TABLET (1 MG TOTAL) BY MOUTH AT BEDTIME. 90 tablet 3  . SYRINGE-NEEDLE, DISP, 3 ML (B-D 3CC LUER-LOK SYR 25GX5/8") 25G X 5/8" 3 ML MISC USE AS DIRECTED WITH B-12 INJECTION 16 each 3  . trimethoprim (TRIMPEX) 100 MG tablet Take 1 tablet (100 mg total) by mouth daily. 90 tablet 1  . Vilazodone HCl (VIIBRYD) 40 MG TABS Take 40 mg by mouth daily.    . vitamin B-12 (CYANOCOBALAMIN) 1000 MCG tablet Take 1,000 mcg by mouth daily.      . Vitamin D, Ergocalciferol, (DRISDOL) 50000 units CAPS capsule Take 1 capsule (50,000 Units total) by mouth every 7 (seven) days. 4 capsule 2  . fluticasone (FLONASE) 50 MCG/ACT nasal spray PLACE 2 SPRAYS INTO BOTH NOSTRILS DAILY. 16 g 5  . fluconazole (DIFLUCAN) 150 MG tablet Take 1 by mouth once, then repeat in 3 days if needed. 2 tablet 0  . sertraline (ZOLOFT) 100 MG tablet Take 3 tablets by mouth daily.    . TRINTELLIX 10 MG TABS      No facility-administered medications prior to visit.     Allergies  Allergen Reactions  . Codeine Itching  . Penicillins Other (See Comments)    Happened in childhood at 52 years of age, unsure of reaction.    Review of Systems  Constitutional: Negative for fever and malaise/fatigue.  HENT: Negative for congestion.   Eyes: Negative for blurred vision.  Respiratory: Negative for cough and shortness of breath.   Cardiovascular: Negative for chest pain, palpitations and leg swelling.  Gastrointestinal: Negative for vomiting.  Musculoskeletal: Negative for back pain.       Right knee pain   Skin: Negative for rash.  Neurological: Negative for loss of consciousness and headaches.       Objective:    Physical Exam  Musculoskeletal:       Right knee: She exhibits swelling. Tenderness found.  Patellar tendon tenderness noted.       Arms: Nursing note and vitals  reviewed.   BP 136/80 (BP Location: Left Arm, Cuff Size: Normal)   Pulse 76   Temp 97.6 F (36.4 C) (Oral)   Resp 16   Ht 5\' 2"  (1.575 m)   Wt 135 lb 6.4 oz (61.4 kg)   SpO2 97%   BMI 24.76 kg/m  Wt Readings from Last 3 Encounters:  04/20/17 135 lb 6.4 oz (61.4 kg)  10/02/16 126 lb 9.6 oz (57.4 kg)  05/01/16 128 lb (58.1 kg)   BP Readings from Last 3 Encounters:  04/20/17 136/80  10/02/16 110/74  05/01/16 112/60     Immunization History  Administered Date(s) Administered  . H1N1 02/27/2008  . Influenza Whole 12/11/2007, 11/03/2009, 11/11/2011  . Influenza,inj,Quad PF,6+ Mos 10/23/2012, 11/18/2013, 11/17/2014, 10/15/2015, 02/26/2017  . Td 02/27/2008  . Zoster Recombinat (Shingrix) 05/01/2016, 07/28/2016    Health Maintenance  Topic Date Due  . HIV Screening  04/08/1980  . PAP SMEAR  04/02/2016  . MAMMOGRAM  10/13/2016  . TETANUS/TDAP  02/26/2018  . COLONOSCOPY  04/16/2022  . INFLUENZA VACCINE  Completed    Lab Results  Component Value Date   WBC 5.6 10/02/2016   HGB 12.9 10/02/2016   HCT 39.7 10/02/2016   PLT 235.0 10/02/2016   GLUCOSE 94 10/02/2016   CHOL 163 05/01/2016   TRIG 93.0 05/01/2016   HDL 71.10 05/01/2016   LDLCALC 73 05/01/2016   ALT 15 10/02/2016   AST 21 10/02/2016   NA 140 10/02/2016   K 5.1 10/02/2016   CL 105 10/02/2016   CREATININE 0.74 10/02/2016   BUN 10 10/02/2016   CO2 27 10/02/2016   TSH 2.83 10/02/2016    Lab Results  Component Value Date   TSH 2.83 10/02/2016   Lab Results  Component Value Date   WBC 5.6 10/02/2016   HGB 12.9 10/02/2016   HCT 39.7 10/02/2016   MCV 88.0 10/02/2016   PLT 235.0 10/02/2016   Lab Results  Component Value Date   NA 140 10/02/2016   K 5.1 10/02/2016   CO2 27 10/02/2016   GLUCOSE 94 10/02/2016   BUN 10 10/02/2016   CREATININE 0.74 10/02/2016   BILITOT 0.5 10/02/2016   ALKPHOS 41 10/02/2016   AST 21 10/02/2016    ALT 15 10/02/2016   PROT 6.6 10/02/2016   ALBUMIN 4.0 10/02/2016   CALCIUM 9.4 10/02/2016   GFR 87.77 10/02/2016   Lab Results  Component Value Date   CHOL 163 05/01/2016   Lab Results  Component Value Date   HDL 71.10 05/01/2016   Lab Results  Component Value Date   LDLCALC 73 05/01/2016   Lab Results  Component Value Date   TRIG 93.0 05/01/2016   Lab Results  Component Value Date   CHOLHDL 2 05/01/2016   No results found for: HGBA1C       Assessment & Plan:   Problem List Items Addressed This Visit      Unprioritized   Muscle spasm    Muscle relaxer Massage Stretches       Relevant Medications   cyclobenzaprine (FLEXERIL) 10 MG tablet   Sinusitis   Relevant Medications   fluticasone (FLONASE) 50 MCG/ACT nasal spray    Other Visit Diagnoses    Chronic pain of right knee    -  Primary   Acute pain of right knee       Relevant Orders   Ambulatory referral to Sports Medicine   DG Knee Complete 4 Views Right      I  have discontinued Sherry Dragovich's sertraline, TRINTELLIX, and fluconazole. I am also having her start on cyclobenzaprine. Additionally, I am having her maintain her vitamin B-12, dextroamphetamine, loperamide, ALPRAZolam, pantoprazole, rOPINIRole, FERRALET 90, SYRINGE-NEEDLE (DISP) 3 ML, promethazine-dextromethorphan, cyclobenzaprine, Vitamin D (Ergocalciferol), Vitamin D3, cyanocobalamin, trimethoprim, gabapentin, Vilazodone HCl, and fluticasone.  Meds ordered this encounter  Medications  . fluticasone (FLONASE) 50 MCG/ACT nasal spray    Sig: Place 2 sprays into both nostrils daily.    Dispense:  16 g    Refill:  5  . cyclobenzaprine (FLEXERIL) 10 MG tablet    Sig: Take 1 tablet (10 mg total) by mouth 3 (three) times daily as needed for muscle spasms.    Dispense:  30 tablet    Refill:  0    CMA served as scribe during this visit. History, Physical and Plan performed by medical provider. Documentation and orders reviewed and attested to.   [name], DO

## 2017-04-20 NOTE — Assessment & Plan Note (Signed)
Muscle relaxer Massage Stretches

## 2017-04-20 NOTE — Patient Instructions (Signed)

## 2017-04-24 ENCOUNTER — Ambulatory Visit (INDEPENDENT_AMBULATORY_CARE_PROVIDER_SITE_OTHER)
Admission: RE | Admit: 2017-04-24 | Discharge: 2017-04-24 | Disposition: A | Discharge status: Home / Self Care | Payer: BLUE CROSS/BLUE SHIELD | Source: Ambulatory Visit | Attending: Family Medicine | Admitting: Family Medicine

## 2017-04-24 DIAGNOSIS — R82998 Other abnormal findings in urine: Secondary | ICD-10-CM | POA: Diagnosis not present

## 2017-04-24 DIAGNOSIS — Z01419 Encounter for gynecological examination (general) (routine) without abnormal findings: Secondary | ICD-10-CM | POA: Diagnosis not present

## 2017-04-24 DIAGNOSIS — Z6824 Body mass index (BMI) 24.0-24.9, adult: Secondary | ICD-10-CM | POA: Diagnosis not present

## 2017-04-24 DIAGNOSIS — Z780 Asymptomatic menopausal state: Secondary | ICD-10-CM | POA: Diagnosis not present

## 2017-04-24 DIAGNOSIS — M25561 Pain in right knee: Secondary | ICD-10-CM | POA: Diagnosis not present

## 2017-04-24 DIAGNOSIS — M1711 Unilateral primary osteoarthritis, right knee: Secondary | ICD-10-CM | POA: Diagnosis not present

## 2017-04-24 DIAGNOSIS — Z130 Encounter for screening for diseases of the blood and blood-forming organs and certain disorders involving the immune mechanism: Secondary | ICD-10-CM | POA: Diagnosis not present

## 2017-04-24 DIAGNOSIS — Z1389 Encounter for screening for other disorder: Secondary | ICD-10-CM | POA: Diagnosis not present

## 2017-04-30 ENCOUNTER — Other Ambulatory Visit: Payer: Self-pay | Admitting: Gynecology

## 2017-04-30 DIAGNOSIS — Z1231 Encounter for screening mammogram for malignant neoplasm of breast: Secondary | ICD-10-CM

## 2017-05-02 ENCOUNTER — Ambulatory Visit: Payer: BLUE CROSS/BLUE SHIELD | Admitting: Family Medicine

## 2017-05-03 ENCOUNTER — Encounter: Payer: Self-pay | Admitting: Family Medicine

## 2017-05-03 ENCOUNTER — Ambulatory Visit (INDEPENDENT_AMBULATORY_CARE_PROVIDER_SITE_OTHER): Payer: BLUE CROSS/BLUE SHIELD | Admitting: Family Medicine

## 2017-05-03 VITALS — BP 130/80 | HR 88 | Temp 98.1°F | Resp 16 | Ht 62.0 in | Wt 134.8 lb

## 2017-05-03 DIAGNOSIS — Z0000 Encounter for general adult medical examination without abnormal findings: Secondary | ICD-10-CM | POA: Diagnosis not present

## 2017-05-03 LAB — COMPREHENSIVE METABOLIC PANEL
ALT: 10 U/L (ref 0–35)
AST: 15 U/L (ref 0–37)
Albumin: 3.8 g/dL (ref 3.5–5.2)
Alkaline Phosphatase: 42 U/L (ref 39–117)
BUN: 12 mg/dL (ref 6–23)
CO2: 27 meq/L (ref 19–32)
Calcium: 8.8 mg/dL (ref 8.4–10.5)
Chloride: 106 mEq/L (ref 96–112)
Creatinine, Ser: 0.73 mg/dL (ref 0.40–1.20)
GFR: 88.96 mL/min (ref >60.00)
GLUCOSE: 86 mg/dL (ref 70–99)
POTASSIUM: 4.2 meq/L (ref 3.5–5.1)
SODIUM: 139 meq/L (ref 135–145)
TOTAL PROTEIN: 6.8 g/dL (ref 6.0–8.3)
Total Bilirubin: 0.6 mg/dL (ref 0.2–1.2)

## 2017-05-03 LAB — CBC WITH DIFFERENTIAL/PLATELET
Basophils Absolute: 0 10*3/uL (ref 0.0–0.1)
Basophils Relative: 0.9 % (ref 0.0–3.0)
EOS PCT: 4.4 % (ref 0.0–5.0)
Eosinophils Absolute: 0.2 10*3/uL (ref 0.0–0.7)
HCT: 38.6 % (ref 36.0–46.0)
Hemoglobin: 12.8 g/dL (ref 12.0–15.0)
LYMPHS ABS: 1.7 10*3/uL (ref 0.7–4.0)
Lymphocytes Relative: 30.3 % (ref 12.0–46.0)
MCHC: 33.1 g/dL (ref 30.0–36.0)
MCV: 87.9 fl (ref 78.0–100.0)
MONOS PCT: 6.7 % (ref 3.0–12.0)
Monocytes Absolute: 0.4 10*3/uL (ref 0.1–1.0)
NEUTROS ABS: 3.2 10*3/uL (ref 1.4–7.7)
NEUTROS PCT: 57.7 % (ref 43.0–77.0)
PLATELETS: 196 10*3/uL (ref 150.0–400.0)
RBC: 4.39 Mil/uL (ref 3.87–5.11)
RDW: 14.8 % (ref 11.5–15.5)
WBC: 5.6 10*3/uL (ref 4.0–10.5)

## 2017-05-03 LAB — LIPID PANEL
CHOL/HDL RATIO: 2
Cholesterol: 148 mg/dL (ref 0–200)
HDL: 79.9 mg/dL (ref >39.00)
LDL Cholesterol: 54 mg/dL (ref 0–99)
NONHDL: 68.41
Triglycerides: 72 mg/dL (ref 0.0–149.0)
VLDL: 14.4 mg/dL (ref 0.0–40.0)

## 2017-05-03 LAB — TSH: TSH: 2.34 u[IU]/mL (ref 0.35–4.50)

## 2017-05-03 NOTE — Progress Notes (Signed)
Subjective:     [name] is a 52 y.o. female and is here for a comprehensive physical exam. The patient reports no new problems.   .  Social History   Socioeconomic History  . Marital status: Married    Spouse name: Not on file  . Number of children: Not on file  . Years of education: Not on file  . Highest education level: Not on file  Occupational History    Employer: [employer]    Comment: [employer]  Social Needs  . Financial resource strain: Not on file  . Food insecurity:    Worry: Not on file    Inability: Not on file  . Transportation needs:    Medical: Not on file    Non-medical: Not on file  Tobacco Use  . Smoking status: Former Smoker    Last attempt to quit: 01/24/1987    Years since quitting: 30.2  . Smokeless tobacco: Never Used  Substance and Sexual Activity  . Alcohol use: Yes    Comment: occasionally  . Drug use: No  . Sexual activity: Not Currently  Lifestyle  . Physical activity:    Days per week: Not on file    Minutes per session: Not on file  . Stress: Not on file  Relationships  . Social connections:    Talks on phone: Not on file    Gets together: Not on file    Attends religious service: Not on file    Active member of club or organization: Not on file    Attends meetings of clubs or organizations: Not on file    Relationship status: Not on file  . Intimate partner violence:    Fear of current or ex partner: Not on file    Emotionally abused: Not on file    Physically abused: Not on file    Forced sexual activity: Not on file  Other Topics Concern  . Not on file  Social History Narrative   Regular exercise--no (because of knees)   Health Maintenance  Topic Date Due  . MAMMOGRAM  10/13/2016  . HIV Screening  05/04/2023 (Originally 04/08/1980)  . INFLUENZA VACCINE  08/23/2017  . TETANUS/TDAP  02/26/2018  . COLONOSCOPY  04/16/2022    The following portions of the patient's history were reviewed and updated as appropriate:   She  has a past medical history of Allergy, Anemia, Anxiety, Asthma, Depression, GERD (gastroesophageal reflux disease), and Heart murmur. She does not have any pertinent problems on file. She  has a past surgical history that includes Abdominal hysterectomy; Gastric bypass; and Tubal ligation. Her family history includes Cancer in her father, maternal grandfather, and paternal grandfather; Depression in her brother and sister; Diabetes in her maternal grandfather, maternal grandmother, paternal grandfather, and paternal grandmother; Hypertension in her father; Liver disease in her maternal grandfather; Stroke in her mother. She  reports that she quit smoking about 30 years ago. She has never used smokeless tobacco. She reports that she drinks alcohol. She reports that she does not use drugs. She has a current medication list which includes the following prescription(s): alprazolam, vitamin d3, vitamin b-12, cyclobenzaprine, dextroamphetamine, fluticasone, gabapentin, loperamide, pantoprazole, ropinirole, syringe-needle (disp) 3 ml, trimethoprim, vilazodone hcl, vitamin d (ergocalciferol), azelastine-fluticasone, loratadine, montelukast, rabeprazole, and trazodone. Current Outpatient Medications on File Prior to Visit  Medication Sig Dispense Refill  . ALPRAZolam (XANAX) 1 MG tablet     . Cholecalciferol (VITAMIN D3) 2000 units capsule Take 1 capsule (2,000 Units total) by mouth  daily. 30 capsule 1  . Cyanocobalamin (VITAMIN B-12) 1000 MCG/15ML LIQD cyanocobalamin (vit B-12) 1,000 mcg/mL injection solution    . cyclobenzaprine (FLEXERIL) 10 MG tablet Take 1 tablet (10 mg total) by mouth 3 (three) times daily as needed. 30 tablet 0  . dextroamphetamine (DEXTROSTAT) 10 MG tablet Take 10 mg by mouth 3 (three) times daily.    . fluticasone (FLONASE) 50 MCG/ACT nasal spray Place 2 sprays into both nostrils daily. 16 g 5  . gabapentin (NEURONTIN) 100 MG capsule TAKE 2 CAPSULES (200 MG TOTAL) BY MOUTH AT  BEDTIME. 180 capsule 1  . loperamide (ANTI-DIARRHEAL) 2 MG capsule Take 2 mg by mouth 3 (three) times daily.    . pantoprazole (PROTONIX) 20 MG tablet Take 20 mg by mouth daily.  3  . rOPINIRole (REQUIP) 1 MG tablet TAKE 1 TABLET (1 MG TOTAL) BY MOUTH AT BEDTIME. 90 tablet 3  . SYRINGE-NEEDLE, DISP, 3 ML (B-D 3CC LUER-LOK SYR 25GX5/8") 25G X 5/8" 3 ML MISC USE AS DIRECTED WITH B-12 INJECTION 16 each 3  . trimethoprim (TRIMPEX) 100 MG tablet Take 1 tablet (100 mg total) by mouth daily. 90 tablet 1  . Vilazodone HCl (VIIBRYD) 40 MG TABS Take 40 mg by mouth daily.    . Vitamin D, Ergocalciferol, (DRISDOL) 50000 units CAPS capsule Take 1 capsule (50,000 Units total) by mouth every 7 (seven) days. 4 capsule 2  . [DISCONTINUED] Azelastine-Fluticasone (DYMISTA) 137-50 MCG/ACT SUSP Place 1 spray into the nose 2 (two) times daily.    . [DISCONTINUED] loratadine (CLARITIN) 10 MG tablet Take 10 mg by mouth daily.      . [DISCONTINUED] montelukast (SINGULAIR) 10 MG tablet Take 10 mg by mouth at bedtime.      . [DISCONTINUED] RABEprazole (ACIPHEX) 20 MG tablet Take 20 mg by mouth daily.      . [DISCONTINUED] traZODone (DESYREL) 100 MG tablet Take 100 mg by mouth at bedtime.       No current facility-administered medications on file prior to visit.    She is allergic to codeine and penicillins..  Review of Systems Review of Systems  Constitutional: Negative for activity change, appetite change and fatigue.  HENT: Negative for hearing loss, congestion, tinnitus and ear discharge.  dentist q59m Eyes: Negative for visual disturbance (see optho q1y -- vision corrected to 20/20 with glasses).  Respiratory: Negative for cough, chest tightness and shortness of breath.   Cardiovascular: Negative for chest pain, palpitations and leg swelling.  Gastrointestinal: Negative for abdominal pain, diarrhea, constipation and abdominal distention.  Genitourinary: Negative for urgency, frequency, decreased urine volume and  difficulty urinating.  Musculoskeletal: Negative for back pain, arthralgias and gait problem.  Skin: Negative for color change, pallor and rash.  Neurological: Negative for dizziness, light-headedness, numbness and headaches.  Hematological: Negative for adenopathy. Does not bruise/bleed easily.  Psychiatric/Behavioral: Negative for suicidal ideas, confusion, sleep disturbance, self-injury, dysphoric mood, decreased concentration and agitation.       Objective:    BP 130/80 (BP Location: Right Arm, Cuff Size: Normal)   Pulse 88   Temp 98.1 F (36.7 C) (Oral)   Resp 16   Ht 5\' 2"  (1.575 m)   Wt 134 lb 12.8 oz (61.1 kg)   SpO2 98%   BMI 24.66 kg/m  General appearance: alert, cooperative, appears stated age and no distress Head: Normocephalic, without obvious abnormality, atraumatic Eyes: negative findings: lids and lashes normal, conjunctivae and sclerae normal and pupils equal, round, reactive to light and accomodation Ears: normal  TM's and external ear canals both ears Nose: Nares normal. Septum midline. Mucosa normal. No drainage or sinus tenderness. Throat: lips, mucosa, and tongue normal; teeth and gums normal Neck: no adenopathy, no carotid bruit, no JVD, supple, symmetrical, trachea midline and thyroid not enlarged, symmetric, no tenderness/mass/nodules Back: symmetric, no curvature. ROM normal. No CVA tenderness. Lungs: clear to auscultation bilaterally Breasts: gyn Heart: regular rate and rhythm, S1, S2 normal, no murmur, click, rub or gallop Abdomen: soft, non-tender; bowel sounds normal; no masses,  no organomegaly Pelvic: deferred---gyn Extremities: extremities normal, atraumatic, no cyanosis or edema Pulses: 2+ and symmetric Skin: Skin color, texture, turgor normal. No rashes or lesions Lymph nodes: Cervical, supraclavicular, and axillary nodes normal. Neurologic: Alert and oriented X 3, normal strength and tone. Normal symmetric reflexes. Normal coordination and  gait    Assessment:    Healthy female exam.      Plan:    ghm utd Check labs  See After Visit Summary for Counseling Recommendations    1. Preventative health care See above - CBC with Differential/Platelet - Comprehensive metabolic panel - Lipid panel - TSH

## 2017-05-03 NOTE — Patient Instructions (Signed)
Preventive Care 40-64 Years, Female Preventive care refers to lifestyle choices and visits with your health care provider that can promote health and wellness. What does preventive care include?  A yearly physical exam. This is also called an annual well check.  Dental exams once or twice a year.  Routine eye exams. Ask your health care provider how often you should have your eyes checked.  Personal lifestyle choices, including: ? Daily care of your teeth and gums. ? Regular physical activity. ? Eating a healthy diet. ? Avoiding tobacco and drug use. ? Limiting alcohol use. ? Practicing safe sex. ? Taking low-dose aspirin daily starting at age 61. ? Taking vitamin and mineral supplements as recommended by your health care provider. What happens during an annual well check? The services and screenings done by your health care provider during your annual well check will depend on your age, overall health, lifestyle risk factors, and family history of disease. Counseling Your health care provider may ask you questions about your:  Alcohol use.  Tobacco use.  Drug use.  Emotional well-being.  Home and relationship well-being.  Sexual activity.  Eating habits.  Work and work Statistician.  Method of birth control.  Menstrual cycle.  Pregnancy history.  Screening You may have the following tests or measurements:  Height, weight, and BMI.  Blood pressure.  Lipid and cholesterol levels. These may be checked every 5 years, or more frequently if you are over 59 years old.  Skin check.  Lung cancer screening. You may have this screening every year starting at age 68 if you have a 30-pack-year history of smoking and currently smoke or have quit within the past 15 years.  Fecal occult blood test (FOBT) of the stool. You may have this test every year starting at age 44.  Flexible sigmoidoscopy or colonoscopy. You may have a sigmoidoscopy every 5 years or a colonoscopy  every 10 years starting at age 35.  Hepatitis C blood test.  Hepatitis B blood test.  Sexually transmitted disease (STD) testing.  Diabetes screening. This is done by checking your blood sugar (glucose) after you have not eaten for a while (fasting). You may have this done every 1-3 years.  Mammogram. This may be done every 1-2 years. Talk to your health care provider about when you should start having regular mammograms. This may depend on whether you have a family history of breast cancer.  BRCA-related cancer screening. This may be done if you have a family history of breast, ovarian, tubal, or peritoneal cancers.  Pelvic exam and Pap test. This may be done every 3 years starting at age 75. Starting at age 52, this may be done every 5 years if you have a Pap test in combination with an HPV test.  Bone density scan. This is done to screen for osteoporosis. You may have this scan if you are at high risk for osteoporosis.  Discuss your test results, treatment options, and if necessary, the need for more tests with your health care provider. Vaccines Your health care provider may recommend certain vaccines, such as:  Influenza vaccine. This is recommended every year.  Tetanus, diphtheria, and acellular pertussis (Tdap, Td) vaccine. You may need a Td booster every 10 years.  Varicella vaccine. You may need this if you have not been vaccinated.  Zoster vaccine. You may need this after age 58.  Measles, mumps, and rubella (MMR) vaccine. You may need at least one dose of MMR if you were born in  1957 or later. You may also need a second dose.  Pneumococcal 13-valent conjugate (PCV13) vaccine. You may need this if you have certain conditions and were not previously vaccinated.  Pneumococcal polysaccharide (PPSV23) vaccine. You may need one or two doses if you smoke cigarettes or if you have certain conditions.  Meningococcal vaccine. You may need this if you have certain  conditions.  Hepatitis A vaccine. You may need this if you have certain conditions or if you travel or work in places where you may be exposed to hepatitis A.  Hepatitis B vaccine. You may need this if you have certain conditions or if you travel or work in places where you may be exposed to hepatitis B.  Haemophilus influenzae type b (Hib) vaccine. You may need this if you have certain conditions.  Talk to your health care provider about which screenings and vaccines you need and how often you need them. This information is not intended to replace advice given to you by your health care provider. Make sure you discuss any questions you have with your health care provider. Document Released: 02/05/2015 Document Revised: 09/29/2015 Document Reviewed: 11/10/2014 Elsevier Interactive Patient Education  [employer].

## 2017-05-04 ENCOUNTER — Ambulatory Visit (INDEPENDENT_AMBULATORY_CARE_PROVIDER_SITE_OTHER): Payer: BLUE CROSS/BLUE SHIELD | Admitting: Family Medicine

## 2017-05-04 ENCOUNTER — Encounter: Payer: Self-pay | Admitting: Family Medicine

## 2017-05-04 DIAGNOSIS — M170 Bilateral primary osteoarthritis of knee: Secondary | ICD-10-CM

## 2017-05-04 DIAGNOSIS — M9901 Segmental and somatic dysfunction of cervical region: Secondary | ICD-10-CM | POA: Diagnosis not present

## 2017-05-04 DIAGNOSIS — M531 Cervicobrachial syndrome: Secondary | ICD-10-CM | POA: Diagnosis not present

## 2017-05-04 DIAGNOSIS — M9902 Segmental and somatic dysfunction of thoracic region: Secondary | ICD-10-CM | POA: Diagnosis not present

## 2017-05-04 DIAGNOSIS — M9903 Segmental and somatic dysfunction of lumbar region: Secondary | ICD-10-CM | POA: Diagnosis not present

## 2017-05-04 MED ORDER — DICLOFENAC SODIUM 2 % TD SOLN: 2.0000 g | Freq: Two times a day (BID) | TRANSDERMAL | 3 refills | Status: DC

## 2017-05-04 NOTE — Patient Instructions (Signed)
Good to see you  [name] is your friend. Ice 20 minutes 2 times daily. Usually after activity and before bed. New brace pennsaid pinkie amount topically 2 times daily as needed.   Vitamin D 2000 IU daily  Exercises 3 times a week.   Injected knee  See me again in 4-5 weeks

## 2017-05-04 NOTE — Progress Notes (Signed)
[name] [address], [place] [address] Phone: [phone] Subjective:       CC: Right knee pain  [id number]  [name] is a 52 y.o. female coming in with complaint of right knee pain. Was playing with her nieces on the floor when she felt patellar tendon pain. States she does feel weakness.  Onset- Chronic Location- Medial Duration-  Character- Sharp Aggravating factors-  Reliving factors-  Therapies tried- Pennsaid, tylenol Severity-6 out of 10.  Patient has known arthritis of the knees bilaterally.     Past Medical History:  Diagnosis Date  . Allergy   . Anemia   . Anxiety   . Asthma   . Depression   . GERD (gastroesophageal reflux disease)   . Heart murmur    Past Surgical History:  Procedure Laterality Date  . ABDOMINAL HYSTERECTOMY     fibroids  . GASTRIC BYPASS    . TUBAL LIGATION     Social History   Socioeconomic History  . Marital status: Married    Spouse name: Not on file  . Number of children: Not on file  . Years of education: Not on file  . Highest education level: Not on file  Occupational History    Employer: [employer]    Comment: [employer]  Social Needs  . Financial resource strain: Not on file  . Food insecurity:    Worry: Not on file    Inability: Not on file  . Transportation needs:    Medical: Not on file    Non-medical: Not on file  Tobacco Use  . Smoking status: Former Smoker    Last attempt to quit: 01/24/1987    Years since quitting: 30.2  . Smokeless tobacco: Never Used  Substance and Sexual Activity  . Alcohol use: Yes    Comment: occasionally  . Drug use: No  . Sexual activity: Not Currently  Lifestyle  . Physical activity:    Days per week: Not on file    Minutes per session: Not on file  . Stress: Not on file  Relationships  . Social connections:    Talks on phone: Not on file    Gets together: Not on file    Attends religious service: Not on file   Active member of club or organization: Not on file    Attends meetings of clubs or organizations: Not on file    Relationship status: Not on file  Other Topics Concern  . Not on file  Social History Narrative   Regular exercise--no (because of knees)   Allergies  Allergen Reactions  . Codeine Itching  . Penicillins Other (See Comments)    Happened in childhood at 52 years of age, unsure of reaction.   Family History  Problem Relation Age of Onset  . Hypertension Father   . Cancer Father        Prostate  . Depression Sister   . Depression Brother   . Diabetes Maternal Grandmother   . Diabetes Maternal Grandfather   . Liver disease Maternal Grandfather   . Cancer Maternal Grandfather        Prostate  . Diabetes Paternal Grandmother   . Diabetes Paternal Grandfather   . Cancer Paternal Grandfather        Lung, Prostate  . Stroke Mother      Past medical history, social, surgical and family history all reviewed in electronic medical record.  No pertanent information unless stated regarding to the  chief complaint.   Review of Systems:Review of systems updated and as accurate as of 05/04/17  No headache, visual changes, nausea, vomiting, diarrhea, constipation, dizziness, abdominal pain, skin rash, fevers, chills, night sweats, weight loss, swollen lymph nodes, body aches, joint swelling, chest pain, shortness of breath, mood changes.   Objective  Blood pressure (!) 123/45, weight 133 lb (60.3 kg). Systems examined below as of 05/04/17   General: No apparent distress alert and oriented x3 mood and affect normal, dressed appropriately.  HEENT: Pupils equal, extraocular movements intact  Respiratory: Patient's speak in full sentences and does not appear short of breath  Cardiovascular: No lower extremity edema, non tender, no erythema  Skin: Warm dry intact with no signs of infection or rash on extremities or on axial skeleton.  Abdomen: Soft nontender  Neuro: Cranial nerves  II through XII are intact, neurovascularly intact in all extremities with 2+ DTRs and 2+ pulses.  Lymph: No lymphadenopathy of posterior or anterior cervical chain or axillae bilaterally.  Gait mild antalgic MSK:  Non tender with full range of motion and good stability and symmetric strength and tone of shoulders, elbows, wrist  and ankles bilaterally.  Knee: Right valgus deformity noted. Large thigh to calf ratio.  Tender to palpation over medial and PF joint line.  ROM full in flexion and extension and lower leg rotation. instability with valgus force.  painful patellar compression. Patellar glide with moderate crepitus. Patellar and quadriceps tendons unremarkable. Hamstring and quadriceps strength is normal. Contralateral knee shows mild arthritic changes but nothing severe.     Impression and Recommendations:     This case required medical decision making of moderate complexity.      Note: This dictation was prepared with Dragon dictation along with smaller phrase technology. Any transcriptional errors that result from this process are unintentional.

## 2017-05-04 NOTE — Assessment & Plan Note (Signed)
Patient was given a right knee injection.  Tolerated the procedure well.  Likely is having some mild patellar subluxation secondary to the patellar arthritis.  We discussed icing regimen and home exercises.  Bracing given.  Discussed which activities to avoid.  Follow-up again in 4 weeks

## 2017-05-04 NOTE — Progress Notes (Signed)
v

## 2017-05-07 DIAGNOSIS — M531 Cervicobrachial syndrome: Secondary | ICD-10-CM | POA: Diagnosis not present

## 2017-05-07 DIAGNOSIS — M9901 Segmental and somatic dysfunction of cervical region: Secondary | ICD-10-CM | POA: Diagnosis not present

## 2017-05-07 DIAGNOSIS — M9903 Segmental and somatic dysfunction of lumbar region: Secondary | ICD-10-CM | POA: Diagnosis not present

## 2017-05-07 DIAGNOSIS — M9902 Segmental and somatic dysfunction of thoracic region: Secondary | ICD-10-CM | POA: Diagnosis not present

## 2017-05-09 DIAGNOSIS — M9902 Segmental and somatic dysfunction of thoracic region: Secondary | ICD-10-CM | POA: Diagnosis not present

## 2017-05-09 DIAGNOSIS — M9903 Segmental and somatic dysfunction of lumbar region: Secondary | ICD-10-CM | POA: Diagnosis not present

## 2017-05-09 DIAGNOSIS — M9901 Segmental and somatic dysfunction of cervical region: Secondary | ICD-10-CM | POA: Diagnosis not present

## 2017-05-09 DIAGNOSIS — M531 Cervicobrachial syndrome: Secondary | ICD-10-CM | POA: Diagnosis not present

## 2017-05-10 DIAGNOSIS — M531 Cervicobrachial syndrome: Secondary | ICD-10-CM | POA: Diagnosis not present

## 2017-05-10 DIAGNOSIS — M9903 Segmental and somatic dysfunction of lumbar region: Secondary | ICD-10-CM | POA: Diagnosis not present

## 2017-05-10 DIAGNOSIS — M9901 Segmental and somatic dysfunction of cervical region: Secondary | ICD-10-CM | POA: Diagnosis not present

## 2017-05-10 DIAGNOSIS — M9902 Segmental and somatic dysfunction of thoracic region: Secondary | ICD-10-CM | POA: Diagnosis not present

## 2017-05-14 DIAGNOSIS — M9903 Segmental and somatic dysfunction of lumbar region: Secondary | ICD-10-CM | POA: Diagnosis not present

## 2017-05-14 DIAGNOSIS — M531 Cervicobrachial syndrome: Secondary | ICD-10-CM | POA: Diagnosis not present

## 2017-05-14 DIAGNOSIS — M9901 Segmental and somatic dysfunction of cervical region: Secondary | ICD-10-CM | POA: Diagnosis not present

## 2017-05-14 DIAGNOSIS — M9902 Segmental and somatic dysfunction of thoracic region: Secondary | ICD-10-CM | POA: Diagnosis not present

## 2017-05-16 DIAGNOSIS — M531 Cervicobrachial syndrome: Secondary | ICD-10-CM | POA: Diagnosis not present

## 2017-05-16 DIAGNOSIS — M9901 Segmental and somatic dysfunction of cervical region: Secondary | ICD-10-CM | POA: Diagnosis not present

## 2017-05-16 DIAGNOSIS — M9902 Segmental and somatic dysfunction of thoracic region: Secondary | ICD-10-CM | POA: Diagnosis not present

## 2017-05-16 DIAGNOSIS — M9903 Segmental and somatic dysfunction of lumbar region: Secondary | ICD-10-CM | POA: Diagnosis not present

## 2017-05-17 DIAGNOSIS — M9901 Segmental and somatic dysfunction of cervical region: Secondary | ICD-10-CM | POA: Diagnosis not present

## 2017-05-17 DIAGNOSIS — M9902 Segmental and somatic dysfunction of thoracic region: Secondary | ICD-10-CM | POA: Diagnosis not present

## 2017-05-17 DIAGNOSIS — M531 Cervicobrachial syndrome: Secondary | ICD-10-CM | POA: Diagnosis not present

## 2017-05-17 DIAGNOSIS — M9903 Segmental and somatic dysfunction of lumbar region: Secondary | ICD-10-CM | POA: Diagnosis not present

## 2017-06-06 ENCOUNTER — Ambulatory Visit: Payer: BLUE CROSS/BLUE SHIELD | Admitting: Family Medicine

## 2017-06-13 DIAGNOSIS — F331 Major depressive disorder, recurrent, moderate: Secondary | ICD-10-CM | POA: Diagnosis not present

## 2017-06-21 ENCOUNTER — Telehealth: Payer: Self-pay | Admitting: *Deleted

## 2017-06-21 NOTE — Telephone Encounter (Signed)
Patient called to check to see if we have got FMLA paperwork.  Advised patient that I have not seen it yet and to try to call tomorrow.

## 2017-06-26 NOTE — Progress Notes (Signed)
[name] [name], [name] [address] Phone: [phone] Subjective:     CC: Right knee pain follow-up  [id number]  [name] is a 52 y.o. female coming in with complaint of right knee pain.  Found to have more mid patella subluxation.  Given injection April 12.  Patient with significant conservative therapy including home exercises, topical anti-inflammatories and icing regimen.  Patient states that the swelling in her knee has gone down.   Patient was also found to have a right hip pain that is more likely from piriformis.She is now having pain on the left hip that feels the same as the right as she fell running after her dog. Patient has pain at night when she is sleeping. Intermittent pain throughout the day.     X-rays the patient's knee taken April 25, 2017 showed mild degenerative changes of the knee but otherwise unremarkable. Past Medical History:  Diagnosis Date  . Allergy   . Anemia   . Anxiety   . Asthma   . Depression   . GERD (gastroesophageal reflux disease)   . Heart murmur    Past Surgical History:  Procedure Laterality Date  . ABDOMINAL HYSTERECTOMY     fibroids  . GASTRIC BYPASS    . TUBAL LIGATION     Social History   Socioeconomic History  . Marital status: Married    Spouse name: Not on file  . Number of children: Not on file  . Years of education: Not on file  . Highest education level: Not on file  Occupational History    Employer: [employer]    Comment: [employer]  Social Needs  . Financial resource strain: Not on file  . Food insecurity:    Worry: Not on file    Inability: Not on file  . Transportation needs:    Medical: Not on file    Non-medical: Not on file  Tobacco Use  . Smoking status: Former Smoker    Last attempt to quit: 01/24/1987    Years since quitting: 30.4  . Smokeless tobacco: Never Used  Substance and Sexual Activity  . Alcohol use: Yes    Comment: occasionally  .  Drug use: No  . Sexual activity: Not Currently  Lifestyle  . Physical activity:    Days per week: Not on file    Minutes per session: Not on file  . Stress: Not on file  Relationships  . Social connections:    Talks on phone: Not on file    Gets together: Not on file    Attends religious service: Not on file    Active member of club or organization: Not on file    Attends meetings of clubs or organizations: Not on file    Relationship status: Not on file  Other Topics Concern  . Not on file  Social History Narrative   Regular exercise--no (because of knees)   Allergies  Allergen Reactions  . Codeine Itching  . Penicillins Other (See Comments)    Happened in childhood at 52 years of age, unsure of reaction.   Family History  Problem Relation Age of Onset  . Hypertension Father   . Cancer Father        Prostate  . Depression Sister   . Depression Brother   . Diabetes Maternal Grandmother   . Diabetes Maternal Grandfather   . Liver disease Maternal Grandfather   . Cancer Maternal Grandfather  Prostate  . Diabetes Paternal Grandmother   . Diabetes Paternal Grandfather   . Cancer Paternal Grandfather        Lung, Prostate  . Stroke Mother      Past medical history, social, surgical and family history all reviewed in electronic medical record.  No pertanent information unless stated regarding to the chief complaint.   Review of Systems:Review of systems updated and as accurate as of 06/28/17  No headache, visual changes, nausea, vomiting, diarrhea, constipation, dizziness, abdominal pain, skin rash, fevers, chills, night sweats, weight loss, swollen lymph nodes, body aches, joint swelling, muscle aches, chest pain, shortness of breath, mood changes.   Objective  Blood pressure 110/80, pulse 82, height 5\' 2"  (1.575 m), weight 134 lb (60.8 kg), SpO2 98 %. Systems examined below as of 06/28/17   General: No apparent distress alert and oriented x3 mood and affect  normal, dressed appropriately.  HEENT: Pupils equal, extraocular movements intact  Respiratory: Patient's speak in full sentences and does not appear short of breath  Cardiovascular: No lower extremity edema, non tender, no erythema  Skin: Warm dry intact with no signs of infection or rash on extremities or on axial skeleton.  Abdomen: Soft nontender  Neuro: Cranial nerves II through XII are intact, neurovascularly intact in all extremities with 2+ DTRs and 2+ pulses.  Lymph: No lymphadenopathy of posterior or anterior cervical chain or axillae bilaterally.  Gait normal with good balance and coordination.  MSK:  Non tender with full range of motion and good stability and symmetric strength and tone of shoulders, elbows, wrist, hip, and ankles bilaterally.  Neck: Inspection unremarkable. No palpable stepoffs. Negative Spurling's maneuver. Mild limitation in range of motion. Grip strength and sensation normal in bilateral hands Strength good C4 to T1 distribution No sensory change to C4 to T1 Negative Hoffman sign bilaterally Reflexes normal Positive tightness of the left trapezius   Low back is a positive Faber on the left side.  Patient has a small hematoma noted over the piriformis on the left side.  Approximately 1 inch in diameter.  Osteopathic findings C6 flexed rotated and side bent left T3 extended rotated and side bent left inhaled third rib T6 extended rotated and side bent right  L2 flexed rotated and side bent right Sacrum right on right   Impression and Recommendations:     This case required medical decision making of moderate complexity.      Note: This dictation was prepared with Dragon dictation along with smaller phrase technology. Any transcriptional errors that result from this process are unintentional.

## 2017-06-28 ENCOUNTER — Ambulatory Visit (INDEPENDENT_AMBULATORY_CARE_PROVIDER_SITE_OTHER): Payer: BLUE CROSS/BLUE SHIELD | Admitting: Family Medicine

## 2017-06-28 ENCOUNTER — Encounter: Payer: Self-pay | Admitting: Family Medicine

## 2017-06-28 DIAGNOSIS — M999 Biomechanical lesion, unspecified: Secondary | ICD-10-CM

## 2017-06-28 DIAGNOSIS — M542 Cervicalgia: Secondary | ICD-10-CM | POA: Insufficient documentation

## 2017-06-28 DIAGNOSIS — S7002XA Contusion of left hip, initial encounter: Secondary | ICD-10-CM

## 2017-06-28 NOTE — Assessment & Plan Note (Signed)
Decision today to treat with OMT was based on Physical Exam  After verbal consent patient was treated with HVLA, ME, FPR techniques in cervical, thoracic, rib lumbar and sacral areas  Patient tolerated the procedure well with improvement in symptoms  Patient given exercises, stretches and lifestyle modifications  See medications in patient instructions if given  Patient will follow up in 4 weeks 

## 2017-06-28 NOTE — Assessment & Plan Note (Signed)
Chronic.  Has some discomfort.  Seen chiropractor before.  We discussed scapular exercises and given them in great detail today.  Discussed icing regimen.  Follow-up again in 4 weeks

## 2017-06-28 NOTE — Patient Instructions (Signed)
Good to see you  New exercise for your neck  Keep hands within peripheral vision  Stay active Heat 10 minutes 3-4 times a day to your butt Arnica lotion 2 times d ay until the marble is gone See me again in 4 weeks

## 2017-06-28 NOTE — Assessment & Plan Note (Signed)
Hematoma noted of the piriformis.  Discussed icing regimen, discussed heat.  Discussed that we need to avoid the blood accumulating and causing more of a heterotropic ossification.  Discussed manual massage.  Follow-up again in 4 weeks

## 2017-06-29 ENCOUNTER — Telehealth: Payer: Self-pay

## 2017-06-29 NOTE — Telephone Encounter (Signed)
Copied from [facility] [id number]. Topic: Inquiry >> Jun 28, 2017  4:47 PM Cecelia Byars, NT wrote: Reason for [id number] called to follow up  on whether or not FMLA paperwork has been completed please call her at  [phone] .

## 2017-07-03 NOTE — Telephone Encounter (Signed)
Left message on machine for patient to refax it over.  We have not seen any paper work.

## 2017-07-03 NOTE — Telephone Encounter (Signed)
This is the last paperwork that I had for patient to take care of spouse: Jorene Minors, have you seen any other paperwork for pt since her 06/06/17 OV?/SLS 06/11  Conversation: [employer]  (Newest Message First)  Jun 01, 2017  Me   3:30 PM  Note    Employee informed that FMLA paperwork would be completed today; she will p/u at her 06/06/17 OV with PCP; faxed and copy made for scan/SLS 05/10     May 31, 2017   4:38 PM  You routed this conversation to Me   Me    4:36 PM  Note    Have begun FMLA paperwork for spouse [Shelisa] to care for patient; will complete as much as possible, then forward to provider/SLS 05/09     May 28, 2017   6:40 PM  Luberta Mutter, Consuello Bossier, CMA routed this conversation to Me    5:11 PM  Oliver Pila B routed this conversation to [employer] B    5:11 PM  Note    Pt's wife called to give the Leave ID# [id number]; contact pt if needed     May 25, 2017  Me   1:52 PM  Note    Noted.     May 24, 2017  Doylene Canning, CMA  to Me      3:54 PM  Note    Paperwork given to [employer].  Paperwork is for [employer]        4:35 PM  Ronny Flurry, CMA routed this conversation to Doylene Canning, CMA  Luberta Mutter Consuello Bossier, CMA   4:10 PM  Note    Pt's spouse brings FMLA papers to be completed for her as pt's caregiver. Neurologist has previously completed FMLA but [employer] recommends that PCP complete FMLA for all of pt's health conditions (back/neck problems, diabetes, htn, eye problems, etc...) including neuro aspects. Spouse brings copy of FMLA completed by Neuro on 12/05/16 which expires now. She states going forward, FMLA can cover a 1 year period instead of every 6 months. New FMLA papers will need a new "leave ID #" and pt's spouse will call us back with that information. Number will need to be added to the FMLA forms before they are sent back to [employer].  New FMLA and previous FMLA  from neuro placed on CMA laptop. Forms will be due by 06/05/17.

## 2017-07-03 NOTE — Telephone Encounter (Signed)
[name] have you seen this paperwork  I have not

## 2017-07-04 ENCOUNTER — Telehealth: Payer: Self-pay | Admitting: *Deleted

## 2017-07-05 NOTE — Telephone Encounter (Signed)
Have you seen any paperwork yet.  If not Ill call her again.

## 2017-07-11 ENCOUNTER — Telehealth: Payer: Self-pay | Admitting: Family Medicine

## 2017-07-11 NOTE — Telephone Encounter (Signed)
Copied from [facility] [id number]. Topic: Inquiry >> Jun 28, 2017  4:47 PM Cecelia Byars, NT wrote: Reason for [id number] called to follow up  on whether or not FMLA paperwork has been completed please call her at  [phone] .  >> Jul 11, 2017  3:37 PM Cleaster Corin, NT wrote: Pt. Stated that she has faxed paperwork over again to office

## 2017-07-11 NOTE — Telephone Encounter (Signed)
I h any paperwork, have you seen any or Dr. Etter Sjogren.?/SLS 06/19

## 2017-07-12 NOTE — Telephone Encounter (Signed)
Left message on voicemail that received paperwork and to allow 7-10 business days to complete.

## 2017-07-16 NOTE — Telephone Encounter (Signed)
Completed as much as possible on paperwork [from last completion]; forwarded to provider/SLS 06/24

## 2017-07-17 NOTE — Telephone Encounter (Signed)
Completed as much as possible of paperwork; forwarded to provider Mon, 07/16/17 to complete with signature/SLS

## 2017-07-21 NOTE — Progress Notes (Signed)
[name] [address] [place], [place] [address] Phone: [phone] Subjective:    I'm seeing this patient by the request  of:    CC: Hip pain  GEX:[id number]  [name] is a 52 y.o. female coming in with complaint of left hip pain. She said that if she trips at work that her hip hurts anteriorly. Patient states that the hematoma in the posterior aspect is better. The "knot" has gone away but she is still having pain.   Was also having neck pain.  Having tightness.  Some mild radiation down the arm.  No numbness or tingling.    Past Medical History:  Diagnosis Date  . Allergy   . Anemia   . Anxiety   . Asthma   . Depression   . GERD (gastroesophageal reflux disease)   . Heart murmur    Past Surgical History:  Procedure Laterality Date  . ABDOMINAL HYSTERECTOMY     fibroids  . GASTRIC BYPASS    . TUBAL LIGATION     Social History   Socioeconomic History  . Marital status: Married    Spouse name: Not on file  . Number of children: Not on file  . Years of education: Not on file  . Highest education level: Not on file  Occupational History    Employer: [employer]    Comment: [employer]  Social Needs  . Financial resource strain: Not on file  . Food insecurity:    Worry: Not on file    Inability: Not on file  . Transportation needs:    Medical: Not on file    Non-medical: Not on file  Tobacco Use  . Smoking status: Former Smoker    Last attempt to quit: 01/24/1987    Years since quitting: 30.5  . Smokeless tobacco: Never Used  Substance and Sexual Activity  . Alcohol use: Yes    Comment: occasionally  . Drug use: No  . Sexual activity: Not Currently  Lifestyle  . Physical activity:    Days per week: Not on file    Minutes per session: Not on file  . Stress: Not on file  Relationships  . Social connections:    Talks on phone: Not on file    Gets together: Not on file    Attends religious service: Not on file   Active member of club or organization: Not on file    Attends meetings of clubs or organizations: Not on file    Relationship status: Not on file  Other Topics Concern  . Not on file  Social History Narrative   Regular exercise--no (because of knees)   Allergies  Allergen Reactions  . Codeine Itching  . Penicillins Other (See Comments)    Happened in childhood at 52 years of age, unsure of reaction.   Family History  Problem Relation Age of Onset  . Hypertension Father   . Cancer Father        Prostate  . Depression Sister   . Depression Brother   . Diabetes Maternal Grandmother   . Diabetes Maternal Grandfather   . Liver disease Maternal Grandfather   . Cancer Maternal Grandfather        Prostate  . Diabetes Paternal Grandmother   . Diabetes Paternal Grandfather   . Cancer Paternal Grandfather        Lung, Prostate  . Stroke Mother      Past medical history, social, surgical and family history all reviewed  in electronic medical record.  No pertanent information unless stated regarding to the chief complaint.   Review of Systems:Review of systems updated and as accurate as of 07/23/17  No headache, visual changes, nausea, vomiting, diarrhea, constipation, dizziness, abdominal pain, skin rash, fevers, chills, night sweats, weight loss, swollen lymph nodes, body aches, joint swelling, muscle aches, chest pain, shortness of breath, mood changes.   Objective  Blood pressure 110/80, pulse 72, height 5\' 2"  (1.575 m), weight 134 lb (60.8 kg), SpO2 98 %. Systems examined below as of 07/23/17   General: No apparent distress alert and oriented x3 mood and affect normal, dressed appropriately.  HEENT: Pupils equal, extraocular movements intact  Respiratory: Patient's speak in full sentences and does not appear short of breath  Cardiovascular: No lower extremity edema, non tender, no erythema  Skin: Warm dry intact with no signs of infection or rash on extremities or on axial  skeleton.  Abdomen: Soft nontender  Neuro: Cranial nerves II through XII are intact, neurovascularly intact in all extremities with 2+ DTRs and 2+ pulses.  Lymph: No lymphadenopathy of posterior or anterior cervical chain or axillae bilaterally.  Gait normal with good balance and coordination.  MSK:  Non tender with full range of motion and good stability and symmetric strength and tone of shoulders, elbows, wrist, hip, knee and ankles bilaterally.   Neck pain shows the patient does have significant amount of tightness.  Some limitation in all planes.  Mild crepitus with range of motion.  Negative Spurling's.  Full strength of the upper extremities.   Low back does have some tightness.  Paraspinal musculature.  More tenderness over the right side.  Positive Faber on the right side.  Neurovascularly intact distally.  Osteopathic findings C2 flexed rotated and side bent right C6 flexed rotated and side bent left T3 extended rotated and side bent right inhaled third rib L2 flexed rotated and side bent right Sacrum right on right     Impression and Recommendations:     This case required medical decision making of moderate complexity.      Note: This dictation was prepared with Dragon dictation along with smaller phrase technology. Any transcriptional errors that result from this process are unintentional.

## 2017-07-23 ENCOUNTER — Ambulatory Visit (INDEPENDENT_AMBULATORY_CARE_PROVIDER_SITE_OTHER): Payer: BLUE CROSS/BLUE SHIELD | Admitting: Family Medicine

## 2017-07-23 ENCOUNTER — Ambulatory Visit (INDEPENDENT_AMBULATORY_CARE_PROVIDER_SITE_OTHER)
Admission: RE | Admit: 2017-07-23 | Discharge: 2017-07-23 | Disposition: A | Discharge status: Home / Self Care | Payer: BLUE CROSS/BLUE SHIELD | Source: Ambulatory Visit | Attending: Family Medicine | Admitting: Family Medicine

## 2017-07-23 ENCOUNTER — Encounter: Payer: Self-pay | Admitting: Family Medicine

## 2017-07-23 VITALS — BP 110/80 | HR 72 | Ht 62.0 in | Wt 134.0 lb

## 2017-07-23 DIAGNOSIS — M999 Biomechanical lesion, unspecified: Secondary | ICD-10-CM

## 2017-07-23 DIAGNOSIS — M542 Cervicalgia: Secondary | ICD-10-CM

## 2017-07-23 DIAGNOSIS — S7002XA Contusion of left hip, initial encounter: Secondary | ICD-10-CM | POA: Diagnosis not present

## 2017-07-23 DIAGNOSIS — M25552 Pain in left hip: Secondary | ICD-10-CM

## 2017-07-23 DIAGNOSIS — M545 Low back pain: Secondary | ICD-10-CM | POA: Diagnosis not present

## 2017-07-23 MED ORDER — GABAPENTIN 100 MG PO CAPS
200.0000 mg | ORAL_CAPSULE | Freq: Every day | ORAL | 1 refills | Status: DC
Start: 2017-07-23 — End: 2018-03-01

## 2017-07-23 NOTE — Assessment & Plan Note (Signed)
Decision today to treat with OMT was based on Physical Exam  After verbal consent patient was treated with HVLA, ME, FPR techniques in cervical, thoracic, lumbar and sacral areas  Patient tolerated the procedure well with improvement in symptoms  Patient given exercises, stretches and lifestyle modifications  See medications in patient instructions if given  Patient will follow up in 4-8 weeks 

## 2017-07-23 NOTE — Patient Instructions (Addendum)
Good to see you  [name] is your friend.  Stay active Try the gabapentin again 200mg  at night to help with the neck pain  Xrays downstairs See me again in 4 week

## 2017-07-23 NOTE — Assessment & Plan Note (Signed)
X-rays now pending.  Discussed icing regimen and home exercises.  Discussed which activities to do which wants to avoid.  Increase activity slowly over the course the next several days.  Follow-up again in 4 to 8 weeks

## 2017-07-23 NOTE — Assessment & Plan Note (Signed)
Improved but will get x-rays to further evaluate.

## 2017-07-30 NOTE — Telephone Encounter (Signed)
Patient in office today with mom and checking status on paperwork.

## 2017-07-31 NOTE — Telephone Encounter (Signed)
Left message on vm that we faxed paperwork back in on 07/16/17 and to call her HR department.

## 2017-07-31 NOTE — Telephone Encounter (Signed)
Telephone Open    07/04/2017 [place] [facility] [place], [place]  (Newest Message First)  Me    07/16/17 11:59 AM  Note    Completed as much as possible on paperwork [from last completion]; forwarded to provider/SLS 06/24

## 2017-08-02 ENCOUNTER — Encounter: Payer: Self-pay | Admitting: Family Medicine

## 2017-08-02 ENCOUNTER — Ambulatory Visit (INDEPENDENT_AMBULATORY_CARE_PROVIDER_SITE_OTHER): Payer: BLUE CROSS/BLUE SHIELD | Admitting: Family Medicine

## 2017-08-02 ENCOUNTER — Ambulatory Visit: Payer: BLUE CROSS/BLUE SHIELD | Admitting: Family Medicine

## 2017-08-02 VITALS — BP 118/73 | HR 69 | Temp 98.2°F | Resp 16 | Ht 62.0 in | Wt 133.0 lb

## 2017-08-02 DIAGNOSIS — I10 Essential (primary) hypertension: Secondary | ICD-10-CM | POA: Diagnosis not present

## 2017-08-02 NOTE — Progress Notes (Signed)
Patient ID: [name], female   DOB: [birth year], 52 y.o.   MRN: [id number]    Subjective:  I acted as a Education administrator for Dr. Carollee Herter.  Guerry Bruin, [employer]   Patient ID: [name], female    DOB: [birth year], 52 y.o.   MRN: [id number]  Chief Complaint  Patient presents with  . follow up blood pressure    HPI  Patient is in today for follow up blood pressure.  Patient Care Team: Carollee Herter, Alferd Apa, DO as PCP - General   Past Medical History:  Diagnosis Date  . Allergy   . Anemia   . Anxiety   . Asthma   . Depression   . GERD (gastroesophageal reflux disease)   . Heart murmur     Past Surgical History:  Procedure Laterality Date  . ABDOMINAL HYSTERECTOMY     fibroids  . GASTRIC BYPASS    . TUBAL LIGATION      Family History  Problem Relation Age of Onset  . Hypertension Father   . Cancer Father        Prostate  . Depression Sister   . Depression Brother   . Diabetes Maternal Grandmother   . Diabetes Maternal Grandfather   . Liver disease Maternal Grandfather   . Cancer Maternal Grandfather        Prostate  . Diabetes Paternal Grandmother   . Diabetes Paternal Grandfather   . Cancer Paternal Grandfather        Lung, Prostate  . Stroke Mother     Social History   Socioeconomic History  . Marital status: Married    Spouse name: Not on file  . Number of children: Not on file  . Years of education: Not on file  . Highest education level: Not on file  Occupational History    Employer: [employer]    Comment: [employer]  Social Needs  . Financial resource strain: Not on file  . Food insecurity:    Worry: Not on file    Inability: Not on file  . Transportation needs:    Medical: Not on file    Non-medical: Not on file  Tobacco Use  . Smoking status: Former Smoker    Last attempt to quit: 01/24/1987    Years since quitting: 30.5  . Smokeless tobacco: Never Used  Substance and Sexual Activity  . Alcohol use: Yes    Comment: occasionally  . Drug  use: No  . Sexual activity: Not Currently  Lifestyle  . Physical activity:    Days per week: Not on file    Minutes per session: Not on file  . Stress: Not on file  Relationships  . Social connections:    Talks on phone: Not on file    Gets together: Not on file    Attends religious service: Not on file    Active member of club or organization: Not on file    Attends meetings of clubs or organizations: Not on file    Relationship status: Not on file  . Intimate partner violence:    Fear of current or ex partner: Not on file    Emotionally abused: Not on file    Physically abused: Not on file    Forced sexual activity: Not on file  Other Topics Concern  . Not on file  Social History Narrative   Regular exercise--no (because of knees)    Outpatient Medications Prior to Visit  Medication Sig Dispense Refill  . ALPRAZolam (XANAX) 1 MG  tablet     . Cholecalciferol (VITAMIN D3) 2000 units capsule Take 1 capsule (2,000 Units total) by mouth daily. 30 capsule 1  . Cyanocobalamin (VITAMIN B-12) 1000 MCG/15ML LIQD cyanocobalamin (vit B-12) 1,000 mcg/mL injection solution    . cyclobenzaprine (FLEXERIL) 10 MG tablet Take 1 tablet (10 mg total) by mouth 3 (three) times daily as needed. 30 tablet 0  . dextroamphetamine (DEXTROSTAT) 10 MG tablet Take 10 mg by mouth 3 (three) times daily.    . Diclofenac Sodium (PENNSAID) 2 % SOLN Place 2 g onto the skin 2 (two) times daily. 112 g 3  . fluticasone (FLONASE) 50 MCG/ACT nasal spray Place 2 sprays into both nostrils daily. 16 g 5  . gabapentin (NEURONTIN) 100 MG capsule Take 2 capsules (200 mg total) by mouth at bedtime. 180 capsule 1  . loperamide (ANTI-DIARRHEAL) 2 MG capsule Take 2 mg by mouth 3 (three) times daily.    . pantoprazole (PROTONIX) 20 MG tablet Take 20 mg by mouth daily.  3  . rOPINIRole (REQUIP) 1 MG tablet TAKE 1 TABLET (1 MG TOTAL) BY MOUTH AT BEDTIME. 90 tablet 3  . SYRINGE-NEEDLE, DISP, 3 ML (B-D 3CC LUER-LOK SYR 25GX5/8")  25G X 5/8" 3 ML MISC USE AS DIRECTED WITH B-12 INJECTION 16 each 3  . trimethoprim (TRIMPEX) 100 MG tablet Take 1 tablet (100 mg total) by mouth daily. 90 tablet 1  . Vilazodone HCl (VIIBRYD) 40 MG TABS Take 40 mg by mouth daily.    . Vitamin D, Ergocalciferol, (DRISDOL) 50000 units CAPS capsule Take 1 capsule (50,000 Units total) by mouth every 7 (seven) days. 4 capsule 2   No facility-administered medications prior to visit.     Allergies  Allergen Reactions  . Codeine Itching  . Penicillins Other (See Comments)    Happened in childhood at 52 years of age, unsure of reaction.    Review of Systems  Constitutional: Negative for fever and malaise/fatigue.  HENT: Negative for congestion.   Eyes: Negative for blurred vision.  Respiratory: Negative for cough and shortness of breath.   Cardiovascular: Negative for chest pain, palpitations and leg swelling.  Gastrointestinal: Negative for vomiting.  Musculoskeletal: Negative for back pain.  Skin: Negative for rash.  Neurological: Negative for loss of consciousness and headaches.       Objective:    Physical Exam  Constitutional: She is oriented to person, place, and time. She appears well-developed and well-nourished.  HENT:  Head: Normocephalic and atraumatic.  Eyes: Conjunctivae and EOM are normal.  Neck: Normal range of motion. Neck supple. No JVD present. Carotid bruit is not present. No thyromegaly present.  Cardiovascular: Normal rate, regular rhythm and normal heart sounds.  No murmur heard. Pulmonary/Chest: Effort normal and breath sounds normal. No respiratory distress. She has no wheezes. She has no rales. She exhibits no tenderness.  Musculoskeletal: She exhibits no edema.  Neurological: She is alert and oriented to person, place, and time.  Psychiatric: She has a normal mood and affect.  Nursing note and vitals reviewed.   BP 118/73 (BP Location: Right Arm, Cuff Size: Normal)   Pulse 69   Temp 98.2 F (36.8 C)  (Oral)   Resp 16   Ht 5\' 2"  (1.575 m)   Wt 133 lb (60.3 kg)   SpO2 100%   BMI 24.33 kg/m  Wt Readings from Last 3 Encounters:  08/02/17 133 lb (60.3 kg)  07/23/17 134 lb (60.8 kg)  06/28/17 134 lb (60.8 kg)   BP  Readings from Last 3 Encounters:  08/02/17 118/73  07/23/17 110/80  06/28/17 110/80     Immunization History  Administered Date(s) Administered  . H1N1 02/27/2008  . Influenza Whole 12/11/2007, 11/03/2009, 11/11/2011  . Influenza,inj,Quad PF,6+ Mos 10/23/2012, 11/18/2013, 11/17/2014, 10/15/2015, 02/26/2017  . Td 02/27/2008  . Zoster Recombinat (Shingrix) 05/01/2016, 07/28/2016    Health Maintenance  Topic Date Due  . MAMMOGRAM  10/13/2016  . HIV Screening  05/04/2023 (Originally 04/08/1980)  . INFLUENZA VACCINE  08/23/2017  . TETANUS/TDAP  02/26/2018  . COLONOSCOPY  04/16/2022    Lab Results  Component Value Date   WBC 5.6 05/03/2017   HGB 12.8 05/03/2017   HCT 38.6 05/03/2017   PLT 196.0 05/03/2017   GLUCOSE 86 05/03/2017   CHOL 148 05/03/2017   TRIG 72.0 05/03/2017   HDL 79.90 05/03/2017   LDLCALC 54 05/03/2017   ALT 10 05/03/2017   AST 15 05/03/2017   NA 139 05/03/2017   K 4.2 05/03/2017   CL 106 05/03/2017   CREATININE 0.73 05/03/2017   BUN 12 05/03/2017   CO2 27 05/03/2017   TSH 2.34 05/03/2017    Lab Results  Component Value Date   TSH 2.34 05/03/2017   Lab Results  Component Value Date   WBC 5.6 05/03/2017   HGB 12.8 05/03/2017   HCT 38.6 05/03/2017   MCV 87.9 05/03/2017   PLT 196.0 05/03/2017   Lab Results  Component Value Date   NA 139 05/03/2017   K 4.2 05/03/2017   CO2 27 05/03/2017   GLUCOSE 86 05/03/2017   BUN 12 05/03/2017   CREATININE 0.73 05/03/2017   BILITOT 0.6 05/03/2017   ALKPHOS 42 05/03/2017   AST 15 05/03/2017   ALT 10 05/03/2017   PROT 6.8 05/03/2017   ALBUMIN 3.8 05/03/2017   CALCIUM 8.8 05/03/2017   GFR 88.96 05/03/2017   Lab Results  Component Value Date   CHOL 148 05/03/2017   Lab Results    Component Value Date   HDL 79.90 05/03/2017   Lab Results  Component Value Date   LDLCALC 54 05/03/2017   Lab Results  Component Value Date   TRIG 72.0 05/03/2017   Lab Results  Component Value Date   CHOLHDL 2 05/03/2017   No results found for: HGBA1C       Assessment & Plan:   Problem List Items Addressed This Visit      Unprioritized   Essential hypertension - Primary    Well controlled, no changes to meds. Encouraged heart healthy diet such as the DASH diet and exercise as tolerated.          I am having Ardis Lawley maintain her dextroamphetamine, loperamide, ALPRAZolam, pantoprazole, rOPINIRole, SYRINGE-NEEDLE (DISP) 3 ML, cyclobenzaprine, Vitamin D (Ergocalciferol), Vitamin D3, trimethoprim, Vilazodone HCl, fluticasone, Vitamin B-12, Diclofenac Sodium, and gabapentin.  No orders of the defined types were placed in this encounter.   CMA served as Education administrator during this visit. History, Physical and Plan performed by medical provider. Documentation and orders reviewed and attested to.  [name], DO

## 2017-08-02 NOTE — Patient Instructions (Signed)
Dementia Dementia is the loss of two or more brain functions, such as:  Memory.  Decision making.  Behavior.  Speaking.  Thinking.  Problem solving.  There are many types of dementia. The most common type is called progressive dementia. Progressive dementia gets worse with time and it is irreversible. An example of this type of dementia is Alzheimer disease. What are the causes? This condition may be caused by:  Nerve cell damage in the brain.  Genetic mutations.  Certain medicines.  Multiple small strokes.  An infection, such as chronic meningitis.  A metabolic problem, such as vitamin B12 deficiency or thyroid disease.  Pressure on the brain, such as from a tumor or blood clot.  What are the signs or symptoms? Symptoms of this condition include:  Sudden changes in mood.  Depression.  Problems with balance.  Changes in personality.  Poor short-term memory.  Agitation.  Delusions.  Hallucinations.  Having a hard time: ? Speaking thoughts. ? Finding words. ? Solving problems. ? Doing familiar tasks. ? Understanding familiar ideas.  How is this diagnosed? This condition is diagnosed with an assessment by your health care provider. During this assessment, your health care provider will talk with you and your family, friends, or caregivers about your symptoms. A thorough medical history will be taken, and you will have a physical exam and tests. Tests may include:  Lab tests, such as blood or urine tests.  Imaging tests, such as a CT scan, PET scan, or MRI.  A lumbar puncture. This test involves removing and testing a small amount of the fluid that surrounds the brain and spinal cord.  An electroencephalogram (EEG). In this test, small metal discs are used to measure electrical activity in the brain.  Memory tests, cognitive tests, and neuropsychological tests. These tests evaluate brain function.  How is this treated? Treatment depends on the  cause of the dementia. It may involve taking medicines that may help:  To control the dementia.  To slow down the disease.  To manage symptoms.  In some cases, treating the cause of the dementia can improve symptoms, reverse symptoms, or slow down how quickly the dementia gets worse. Your health care provider can help direct you to support groups, organizations, and other health care providers who can help with decisions about your care. Follow these instructions at home: Medicine  Take over-the-counter and prescription medicines only as told by your health care provider.  Avoid taking medicines that can affect thinking, such as pain or sleeping medicines. Lifestyle   Make healthy lifestyle choices: ? Be physically active as told by your health care provider. ? Do not use any tobacco products, such as cigarettes, chewing tobacco, and e-cigarettes. If you need help quitting, ask your health care provider. ? Eat a healthy diet. ? Practice stress-management techniques when you get stressed. ? Stay social.  Drink enough fluid to keep your urine clear or pale yellow.  Make sure to get quality sleep. These tips can help you to get a good night's rest: ? Avoid napping during the day. ? Keep your sleeping area dark and cool. ? Avoid exercising during the few hours before you go to bed. ? Avoid caffeine products in the evening. General instructions  Work with your health care provider to determine what you need help with and what your safety needs are.  If you were given a bracelet that tracks your location, make sure to wear it.  Keep all follow-up visits as told by your   health care provider. This is important. Contact a health care provider if:  You have any new symptoms.  You have problems with choking or swallowing.  You have any symptoms of a different illness. Get help right away if:  You develop a fever.  You have new or worsening confusion.  You have new or  worsening sleepiness.  You have a hard time staying awake.  You or your family members become concerned for your safety. This information is not intended to replace advice given to you by your health care provider. Make sure you discuss any questions you have with your health care provider. Document Released: 07/05/2000 Document Revised: 05/20/2015 Document Reviewed: 10/07/2014 Elsevier Interactive Patient Education  2018 [employer] Inc[employer]  

## 2017-08-02 NOTE — Assessment & Plan Note (Signed)
Well controlled, no changes to meds. Encouraged heart healthy diet such as the DASH diet and exercise as tolerated.  °

## 2017-08-20 ENCOUNTER — Ambulatory Visit: Payer: BLUE CROSS/BLUE SHIELD | Admitting: Family Medicine

## 2017-09-11 ENCOUNTER — Ambulatory Visit: Payer: BLUE CROSS/BLUE SHIELD | Admitting: Family Medicine

## 2017-09-17 DIAGNOSIS — F900 Attention-deficit hyperactivity disorder, predominantly inattentive type: Secondary | ICD-10-CM | POA: Diagnosis not present

## 2017-09-17 DIAGNOSIS — F331 Major depressive disorder, recurrent, moderate: Secondary | ICD-10-CM | POA: Diagnosis not present

## 2017-09-17 DIAGNOSIS — F410 Panic disorder [episodic paroxysmal anxiety] without agoraphobia: Secondary | ICD-10-CM | POA: Diagnosis not present

## 2018-02-18 ENCOUNTER — Ambulatory Visit: Payer: Self-pay

## 2018-02-18 NOTE — Telephone Encounter (Signed)
Pt. Reports the gabapentin is helping at night for her neck pain. She needs something she can take during the day. Can not take antiinflammatory due to past gastric bypass. Please advise pt. Uses [facility] in [place]

## 2018-02-18 NOTE — Telephone Encounter (Signed)
Start tylenol 650mg  2-3 times a day . Should not hit her stomach at all  Then when I see her again we can try another medicine but I would need to see her again

## 2018-02-19 NOTE — Telephone Encounter (Signed)
Discussed with pt. She is coming in to see Dr. Tamala Julian 02/22/2018.

## 2018-02-19 NOTE — Telephone Encounter (Signed)
lmovm for pt to return call.  

## 2018-02-22 ENCOUNTER — Encounter: Payer: Self-pay | Admitting: Family Medicine

## 2018-02-22 ENCOUNTER — Ambulatory Visit (INDEPENDENT_AMBULATORY_CARE_PROVIDER_SITE_OTHER): Payer: BLUE CROSS/BLUE SHIELD | Admitting: Family Medicine

## 2018-02-22 VITALS — BP 120/72 | HR 98 | Ht 62.0 in | Wt 126.0 lb

## 2018-02-22 DIAGNOSIS — G8929 Other chronic pain: Secondary | ICD-10-CM | POA: Diagnosis not present

## 2018-02-22 DIAGNOSIS — M542 Cervicalgia: Secondary | ICD-10-CM

## 2018-02-22 DIAGNOSIS — M5412 Radiculopathy, cervical region: Secondary | ICD-10-CM

## 2018-02-22 MED ORDER — KETOROLAC TROMETHAMINE 60 MG/2ML IM SOLN
60.0000 mg | Freq: Once | INTRAMUSCULAR | Status: AC
Start: 2018-02-22 — End: 2018-02-22
  Administered 2018-02-22: 60 mg via INTRAMUSCULAR

## 2018-02-22 MED ORDER — METHYLPREDNISOLONE ACETATE 80 MG/ML IJ SUSP
80.0000 mg | Freq: Once | INTRAMUSCULAR | Status: AC
  Administered 2018-02-22: 80 mg via INTRAMUSCULAR

## 2018-02-22 MED ORDER — PREDNISONE 50 MG PO TABS: 50.0000 mg | ORAL_TABLET | Freq: Every day | ORAL | 0 refills | Status: DC

## 2018-02-22 NOTE — Assessment & Plan Note (Signed)
Patient has cervical radiculopathy.  Previous x-rays show degenerative disc disease.  Patient is having radicular symptoms and some weakness in the C7 and C8 distribution.  Patient has had increasing limited range of motion and is failed all conservative therapy including home and formal physical therapy.  At this time I do think advanced imaging would be warranted.  Patient could be a candidate for possible epidural steroid injections as well.  Discussed to have patient be more compliant on her medications.  Patient is looking for daytime medications and route of narcotics which I declined to give patient.  We discussed the possibility of either daytime dosing of gabapentin or changing patient's antidepressant to a medication that could help with nerves as well.  Patient will discuss this with her other physicians.  Follow-up with me again after the imaging to discuss further treatment

## 2018-02-22 NOTE — Patient Instructions (Addendum)
Good to see you  2 injections today  If not better by tomorrow can take prednisone daily for 5 days  Could not get through to Dr. Toy Care. Could you ask her if effexor or cymbalta would be a good  We order MRI of the neck  Call [phone] to schedule.  I will write you in my chart and then discuss possibility of injeciton if we see a nerve pinched

## 2018-02-22 NOTE — Progress Notes (Signed)
[name] [address], [place] [address] Phone: [phone] Subjective:    I [name] am serving as a Education administrator for Dr. Hulan Saas.  CC: Neck pain follow-up  ZYS:[id number]  [name] is a 53 y.o. female coming in with complaint of neck pain. Hasn't been taking the gabapentin. Pain keeps her up at night. States that her neck was very tight last night. Loss of ROM. TTP. States that she loses circulation in the finger tips. Right side is worse than left.   Onset- Chronic Location- Right side of Occiput Character-no, throbbing aching pain Aggravating factors- extension, rotation  Therapies tried- Massage  Severity-5 out of 10 on a daily basis but can be as bad as 9 out of 10.     Past Medical History:  Diagnosis Date  . Allergy   . Anemia   . Anxiety   . Asthma   . Depression   . GERD (gastroesophageal reflux disease)   . Heart murmur    Past Surgical History:  Procedure Laterality Date  . ABDOMINAL HYSTERECTOMY     fibroids  . GASTRIC BYPASS    . TUBAL LIGATION     Social History   Socioeconomic History  . Marital status: Married    Spouse name: Not on file  . Number of children: Not on file  . Years of education: Not on file  . Highest education level: Not on file  Occupational History    Employer: [employer]    Comment: [employer]  Social Needs  . Financial resource strain: Not on file  . Food insecurity:    Worry: Not on file    Inability: Not on file  . Transportation needs:    Medical: Not on file    Non-medical: Not on file  Tobacco Use  . Smoking status: Former Smoker    Last attempt to quit: 01/24/1987    Years since quitting: 31.1  . Smokeless tobacco: Never Used  Substance and Sexual Activity  . Alcohol use: Yes    Comment: occasionally  . Drug use: No  . Sexual activity: Not Currently  Lifestyle  . Physical activity:    Days per week: Not on file    Minutes per session: Not on file  .  Stress: Not on file  Relationships  . Social connections:    Talks on phone: Not on file    Gets together: Not on file    Attends religious service: Not on file    Active member of club or organization: Not on file    Attends meetings of clubs or organizations: Not on file    Relationship status: Not on file  Other Topics Concern  . Not on file  Social History Narrative   Regular exercise--no (because of knees)   Allergies  Allergen Reactions  . Codeine Itching  . Penicillins Other (See Comments)    Happened in childhood at 53 years of age, unsure of reaction.   Family History  Problem Relation Age of Onset  . Hypertension Father   . Cancer Father        Prostate  . Depression Sister   . Depression Brother   . Diabetes Maternal Grandmother   . Diabetes Maternal Grandfather   . Liver disease Maternal Grandfather   . Cancer Maternal Grandfather        Prostate  . Diabetes Paternal Grandmother   . Diabetes Paternal Grandfather   . Cancer Paternal Grandfather  Lung, Prostate  . Stroke Mother     Current Outpatient Medications (Endocrine & Metabolic):  .  predniSONE (DELTASONE) 50 MG tablet, Take 1 tablet (50 mg total) by mouth daily.   Current Outpatient Medications (Respiratory):  .  fluticasone (FLONASE) 50 MCG/ACT nasal spray, Place 2 sprays into both nostrils daily.   Current Outpatient Medications (Hematological):  Marland Kitchen  Cyanocobalamin (VITAMIN B-12) 1000 MCG/15ML LIQD, cyanocobalamin (vit B-12) 1,000 mcg/mL injection solution  Current Outpatient Medications (Other):  Marland Kitchen  ALPRAZolam (XANAX) 1 MG tablet,  .  Cholecalciferol (VITAMIN D3) 2000 units capsule, Take 1 capsule (2,000 Units total) by mouth daily. .  cyclobenzaprine (FLEXERIL) 10 MG tablet, Take 1 tablet (10 mg total) by mouth 3 (three) times daily as needed. Marland Kitchen  dextroamphetamine (DEXTROSTAT) 10 MG tablet, Take 10 mg by mouth 3 (three) times daily. .  Diclofenac Sodium (PENNSAID) 2 % SOLN, Place 2 g  onto the skin 2 (two) times daily. Marland Kitchen  gabapentin (NEURONTIN) 100 MG capsule, Take 2 capsules (200 mg total) by mouth at bedtime. Marland Kitchen  loperamide (ANTI-DIARRHEAL) 2 MG capsule, Take 2 mg by mouth 3 (three) times daily. .  pantoprazole (PROTONIX) 20 MG tablet, Take 20 mg by mouth daily. Marland Kitchen  rOPINIRole (REQUIP) 1 MG tablet, TAKE 1 TABLET (1 MG TOTAL) BY MOUTH AT BEDTIME. Marland Kitchen  SYRINGE-NEEDLE, DISP, 3 ML (B-D 3CC LUER-LOK SYR 25GX5/8") 25G X 5/8" 3 ML MISC, USE AS DIRECTED WITH B-12 INJECTION .  trimethoprim (TRIMPEX) 100 MG tablet, Take 1 tablet (100 mg total) by mouth daily. .  Vilazodone HCl (VIIBRYD) 40 MG TABS, Take 40 mg by mouth daily. .  Vitamin D, Ergocalciferol, (DRISDOL) 50000 units CAPS capsule, Take 1 capsule (50,000 Units total) by mouth every 7 (seven) days.    Past medical history, social, surgical and family history all reviewed in electronic medical record.  No pertanent information unless stated regarding to the chief complaint.   Review of Systems:  No headache, visual changes, nausea, vomiting, diarrhea, constipation, dizziness, abdominal pain, skin rash, fevers, chills, night sweats, weight loss, swollen lymph nodes, body aches, joint swelling,, chest pain, shortness of breath, mood changes.   Objective  Blood pressure 120/72, pulse 98, height 5\' 2"  (1.575 m), weight 126 lb (57.2 kg), SpO2 99 %.    General: No apparent distress alert and oriented x3 mood and affect normal, dressed appropriately.  HEENT: Pupils equal, extraocular movements intact  Respiratory: Patient's speak in full sentences and does not appear short of breath  Cardiovascular: No lower extremity edema, non tender, no erythema  Skin: Warm dry intact with no signs of infection or rash on extremities or on axial skeleton.  Abdomen: Soft nontender  Neuro: Cranial nerves II through XII are intact, neurovascularly intact in all extremities with 2+ DTRs and 2+ pulses.  Lymph: No lymphadenopathy of posterior or  anterior cervical chain or axillae bilaterally.  Gait normal with good balance and coordination.  MSK:  Non tender with full range of motion and good stability and symmetric strength and tone of shoulders, elbows, wrist, hip, knee and ankles bilaterally.  Mild arthritic changes of multiple joints During exam has a severe loss of lordosis.  Severe loss of range of motion 5 degrees of sidebending.  Positive Spurling's with radicular symptoms in the C7 distribution.  Patient does have weakness along the C8 distribution.      Impression and Recommendations:     . The above documentation has been reviewed and is accurate and complete Alroy Dust  Koren Bound, DO       Note: This dictation was prepared with Dragon dictation along with smaller phrase technology. Any transcriptional errors that result from this process are unintentional.

## 2018-02-28 ENCOUNTER — Ambulatory Visit
Admission: RE | Admit: 2018-02-28 | Discharge: 2018-02-28 | Disposition: A | Discharge status: Home / Self Care | Payer: BLUE CROSS/BLUE SHIELD | Source: Ambulatory Visit | Attending: Family Medicine | Admitting: Family Medicine

## 2018-02-28 DIAGNOSIS — G8929 Other chronic pain: Secondary | ICD-10-CM

## 2018-02-28 DIAGNOSIS — M4802 Spinal stenosis, cervical region: Secondary | ICD-10-CM | POA: Diagnosis not present

## 2018-02-28 DIAGNOSIS — M5030 Other cervical disc degeneration, unspecified cervical region: Secondary | ICD-10-CM | POA: Diagnosis not present

## 2018-02-28 DIAGNOSIS — M542 Cervicalgia: Principal | ICD-10-CM

## 2018-03-01 ENCOUNTER — Telehealth: Payer: Self-pay | Admitting: Family Medicine

## 2018-03-01 ENCOUNTER — Other Ambulatory Visit: Payer: Self-pay | Admitting: Family Medicine

## 2018-03-01 DIAGNOSIS — M5412 Radiculopathy, cervical region: Secondary | ICD-10-CM

## 2018-03-01 NOTE — Telephone Encounter (Signed)
Copied from [place] [id number]. Topic: General - Inquiry >> Mar 01, 2018  1:56 PM Vernona Rieger wrote: Reason for CRM: patient said she reviewed her MRI results on mychart and it was recommended she get a epidural. She would like to know how to get that scheduled?

## 2018-03-04 NOTE — Telephone Encounter (Signed)
Epidural ordered & sent to [place]. Pt made aware.

## 2018-03-05 ENCOUNTER — Ambulatory Visit
Admission: RE | Admit: 2018-03-05 | Discharge: 2018-03-05 | Disposition: A | Discharge status: Home / Self Care | Payer: BLUE CROSS/BLUE SHIELD | Source: Ambulatory Visit | Attending: Family Medicine | Admitting: Family Medicine

## 2018-03-05 DIAGNOSIS — M47812 Spondylosis without myelopathy or radiculopathy, cervical region: Secondary | ICD-10-CM | POA: Diagnosis not present

## 2018-03-05 DIAGNOSIS — M5412 Radiculopathy, cervical region: Secondary | ICD-10-CM

## 2018-03-05 MED ORDER — TRIAMCINOLONE ACETONIDE 40 MG/ML IJ SUSP (RADIOLOGY)
60.0000 mg | Freq: Once | INTRAMUSCULAR | Status: AC
  Administered 2018-03-05: 60 mg via EPIDURAL

## 2018-03-05 MED ORDER — IOPAMIDOL (ISOVUE-M 300) INJECTION 61%
1.0000 mL | Freq: Once | INTRAMUSCULAR | Status: AC | PRN
  Administered 2018-03-05: 1 mL via EPIDURAL

## 2018-03-05 NOTE — Discharge Instructions (Signed)

## 2018-03-07 ENCOUNTER — Inpatient Hospital Stay (HOSPITAL_COMMUNITY)
Admission: EM | Admit: 2018-03-07 | Discharge: 2018-03-12 | DRG: 331 | Disposition: A | Discharge status: Home / Self Care | Payer: BLUE CROSS/BLUE SHIELD | Attending: General Surgery | Admitting: General Surgery

## 2018-03-07 ENCOUNTER — Other Ambulatory Visit: Payer: Self-pay

## 2018-03-07 ENCOUNTER — Encounter (HOSPITAL_COMMUNITY): Payer: Self-pay | Admitting: Emergency Medicine

## 2018-03-07 DIAGNOSIS — K285 Chronic or unspecified gastrojejunal ulcer with perforation: Principal | ICD-10-CM | POA: Diagnosis present

## 2018-03-07 DIAGNOSIS — Z9884 Bariatric surgery status: Secondary | ICD-10-CM | POA: Diagnosis not present

## 2018-03-07 DIAGNOSIS — Z23 Encounter for immunization: Secondary | ICD-10-CM

## 2018-03-07 DIAGNOSIS — Z885 Allergy status to narcotic agent status: Secondary | ICD-10-CM | POA: Diagnosis not present

## 2018-03-07 DIAGNOSIS — R1084 Generalized abdominal pain: Secondary | ICD-10-CM | POA: Diagnosis not present

## 2018-03-07 DIAGNOSIS — Z90710 Acquired absence of both cervix and uterus: Secondary | ICD-10-CM | POA: Diagnosis not present

## 2018-03-07 DIAGNOSIS — F329 Major depressive disorder, single episode, unspecified: Secondary | ICD-10-CM | POA: Diagnosis present

## 2018-03-07 DIAGNOSIS — R1032 Left lower quadrant pain: Secondary | ICD-10-CM | POA: Diagnosis not present

## 2018-03-07 DIAGNOSIS — Z87891 Personal history of nicotine dependence: Secondary | ICD-10-CM | POA: Diagnosis not present

## 2018-03-07 DIAGNOSIS — J45909 Unspecified asthma, uncomplicated: Secondary | ICD-10-CM | POA: Diagnosis not present

## 2018-03-07 DIAGNOSIS — K275 Chronic or unspecified peptic ulcer, site unspecified, with perforation: Secondary | ICD-10-CM | POA: Diagnosis not present

## 2018-03-07 DIAGNOSIS — K668 Other specified disorders of peritoneum: Secondary | ICD-10-CM | POA: Diagnosis present

## 2018-03-07 DIAGNOSIS — F419 Anxiety disorder, unspecified: Secondary | ICD-10-CM | POA: Diagnosis present

## 2018-03-07 DIAGNOSIS — K251 Acute gastric ulcer with perforation: Secondary | ICD-10-CM | POA: Diagnosis not present

## 2018-03-07 DIAGNOSIS — R109 Unspecified abdominal pain: Secondary | ICD-10-CM | POA: Diagnosis not present

## 2018-03-07 DIAGNOSIS — K219 Gastro-esophageal reflux disease without esophagitis: Secondary | ICD-10-CM | POA: Diagnosis not present

## 2018-03-07 DIAGNOSIS — Z79899 Other long term (current) drug therapy: Secondary | ICD-10-CM | POA: Diagnosis not present

## 2018-03-07 DIAGNOSIS — Z7951 Long term (current) use of inhaled steroids: Secondary | ICD-10-CM

## 2018-03-07 DIAGNOSIS — Z880 Allergy status to penicillin: Secondary | ICD-10-CM | POA: Diagnosis not present

## 2018-03-07 DIAGNOSIS — Z818 Family history of other mental and behavioral disorders: Secondary | ICD-10-CM | POA: Diagnosis not present

## 2018-03-07 DIAGNOSIS — K669 Disorder of peritoneum, unspecified: Secondary | ICD-10-CM | POA: Diagnosis not present

## 2018-03-07 MED ORDER — HYDROMORPHONE HCL 1 MG/ML IJ SOLN
1.0000 mg | Freq: Once | INTRAMUSCULAR | Status: AC
  Administered 2018-03-07: 1 mg via INTRAVENOUS
  Filled 2018-03-07: qty 1

## 2018-03-07 NOTE — ED Triage Notes (Signed)
Pt transfer from [facility] for perforated gastric ulcer. Pt c/o sudden onset abdominal pain today at 1700. Hx gastric bypass in 2017.

## 2018-03-07 NOTE — ED Provider Notes (Signed)
[facility] EMERGENCY DEPARTMENT Provider Note   CSN: [id number] Arrival date & time: 03/07/18  2214     History   Chief Complaint Chief Complaint  Patient presents with  . Abdominal Pain    HPI Courtenay Hirth is a 53 y.o. female.  The history is provided by the patient and medical records.  Abdominal Pain     53 y.o. F with hx of allergies, anemia, anxiety, asthma, depression, GERD, s/p gastric bypass, presenting to the ED from [facility] for evaluation by general surgery.  Patient had sudden onset of pain today around 5 PM.  She went to [facility] and underwent CT scan which was concerning for perforated gastric ulcer.  Case was discussed with on-call general surgery, Dr. Grandville Silos, here and transferred over.  Patient reports continued pain here.  She has not had any nausea or vomiting.  No fevers.  Last oral intake was around lunchtime.  Other surgeries include hysterectomy and tubal ligation.  Past Medical History:  Diagnosis Date  . Allergy   . Anemia   . Anxiety   . Asthma   . Depression   . GERD (gastroesophageal reflux disease)   . Heart murmur     Patient Active Problem List   Diagnosis Date Noted  . Cervical radiculopathy at C7 02/22/2018  . Essential hypertension 08/02/2017  . Hematoma of hip, left, initial encounter 06/28/2017  . Neck pain 06/28/2017  . Nonallopathic lesion of cervical region 06/28/2017  . Nonallopathic lesion of thoracic region 06/28/2017  . Nonallopathic lesion of lumbar region 06/28/2017  . Muscle spasm 04/20/2017  . URI (upper respiratory infection) 10/02/2016  . Preventative health care 05/01/2016  . Piriformis syndrome, right 11/15/2015  . Degenerative arthritis of knee, bilateral 06/02/2015  . Patellofemoral arthritis of left knee 05/04/2015  . Cough 09/02/2013  . Acute pharyngitis 09/02/2013  . Sinusitis 06/25/2013  . Dog bite of face 04/16/2013  . Gastroenteritis 04/16/2013  . Allergic rhinitis  04/16/2013  . Mass of soft tissue 08/02/2012  . RESTLESS LEGS SYNDROME 01/21/2009  . LYMPHOPENIA 03/23/2008  . Depression 02/27/2008  . HEMANGIOMA, SKIN 02/04/2008  . Lap Venita Lick October 2007 for BMI 46 02/19/2007    Past Surgical History:  Procedure Laterality Date  . ABDOMINAL HYSTERECTOMY     fibroids  . GASTRIC BYPASS    . TUBAL LIGATION       OB History   No obstetric history on file.      Home Medications    Prior to Admission medications   Medication Sig Start Date End Date Taking? Authorizing Provider  ALPRAZolam Duanne Moron) 1 MG tablet Take 0.25-1 mg by mouth as needed (before procedures or anxiety).  05/31/12  Yes [provider]  Cholecalciferol (VITAMIN D3) 2000 units capsule Take 1 capsule (2,000 Units total) by mouth daily. 05/01/16  Yes Roma Schanz R, DO  Cyanocobalamin 1000 MCG/ML KIT Inject 1,000 mcg as directed every 30 (thirty) days.   Yes [provider]  cyclobenzaprine (FLEXERIL) 10 MG tablet Take 1 tablet (10 mg total) by mouth 3 (three) times daily as needed. Patient taking differently: Take 10 mg by mouth 3 (three) times daily as needed for muscle spasms.  04/28/16  Yes Roma Schanz R, DO  dextroamphetamine (DEXTROSTAT) 10 MG tablet Take 10 mg by mouth 3 (three) times daily.   Yes [provider]  Diclofenac Sodium (PENNSAID) 2 % SOLN Place 2 g onto the skin 2 (two) times daily. 05/04/17  Yes Hulan Saas M, DO  fluticasone [facility]) 50 MCG/ACT nasal spray Place 2 sprays into both nostrils daily. Patient taking differently: Place 2 sprays into both nostrils daily as needed for allergies or rhinitis.  04/20/17  Yes Roma Schanz R, DO  gabapentin (NEURONTIN) 100 MG capsule TAKE 2 CAPSULES BY MOUTH AT BEDTIME. Patient taking differently: Take 200 mg by mouth at bedtime as needed (for RLS and/or pain).  03/01/18  Yes Lyndal Pulley, DO  loperamide (ANTI-DIARRHEAL) 2 MG capsule Take 2 mg by mouth 3 (three) times daily.    Yes [provider]  rOPINIRole (REQUIP) 1 MG tablet TAKE 1 TABLET (1 MG TOTAL) BY MOUTH AT BEDTIME. Patient taking differently: Take 1 mg by mouth at bedtime.  04/20/15  Yes Roma Schanz R, DO  trimethoprim (TRIMPEX) 100 MG tablet Take 1 tablet (100 mg total) by mouth daily. Patient taking differently: Take 100 mg by mouth daily as needed ("as directed, for bladder infections").  08/03/16  Yes [name], DO  Vilazodone HCl (VIIBRYD) 40 MG TABS Take 40 mg by mouth daily.   Yes [provider]  Vitamin D, Ergocalciferol, (DRISDOL) 50000 units CAPS capsule Take 1 capsule (50,000 Units total) by mouth every 7 (seven) days. 05/01/16  Yes Roma Schanz R, DO  predniSONE (DELTASONE) 50 MG tablet Take 1 tablet (50 mg total) by mouth daily. Patient not taking: Reported on 03/07/2018 02/22/18   Lyndal Pulley, DO  SYRINGE-NEEDLE, DISP, 3 ML (B-D 3CC LUER-LOK SYR 25GX5/8") 25G X 5/8" 3 ML MISC USE AS DIRECTED WITH B-12 INJECTION 10/15/15   Carollee Herter, Alferd Apa, DO  Azelastine-Fluticasone (DYMISTA) 137-50 MCG/ACT SUSP Place 1 spray into the nose 2 (two) times daily. 12/20/10 04/14/11  [name], DO  loratadine (CLARITIN) 10 MG tablet Take 10 mg by mouth daily.    04/14/11  [provider]  montelukast (SINGULAIR) 10 MG tablet Take 10 mg by mouth at bedtime.    04/14/11  [provider]  RABEprazole (ACIPHEX) 20 MG tablet Take 20 mg by mouth daily.    04/14/11  [provider]  traZODone (DESYREL) 100 MG tablet Take 100 mg by mouth at bedtime.    04/14/11  [provider]    Family History Family History  Problem Relation Age of Onset  . Hypertension Father   . Cancer Father        Prostate  . Depression Sister   . Depression Brother   . Diabetes Maternal Grandmother   . Diabetes Maternal Grandfather   . Liver disease Maternal Grandfather   . Cancer Maternal Grandfather        Prostate  . Diabetes Paternal  Grandmother   . Diabetes Paternal Grandfather   . Cancer Paternal Grandfather        Lung, Prostate  . Stroke Mother     Social History Social History   Tobacco Use  . Smoking status: Former Smoker    Last attempt to quit: 01/24/1987    Years since quitting: 31.1  . Smokeless tobacco: Never Used  Substance Use Topics  . Alcohol use: Yes    Comment: occasionally  . Drug use: No     Allergies   Codeine and Penicillins   Review of Systems Review of Systems  Gastrointestinal: Positive for abdominal pain.  All other systems reviewed and are negative.    Physical Exam Updated Vital Signs BP 140/89   Pulse 84   Temp Marland Kitchen)  97.2 F (36.2 C) (Oral)   Resp 20   SpO2 100%   Physical Exam Vitals signs and nursing note reviewed.  Constitutional:      Appearance: She is well-developed.     Comments: Appears uncomfortable  HENT:     Head: Normocephalic and atraumatic.  Eyes:     Conjunctiva/sclera: Conjunctivae normal.     Pupils: Pupils are equal, round, and reactive to light.  Neck:     Musculoskeletal: Normal range of motion.  Cardiovascular:     Rate and Rhythm: Normal rate and regular rhythm.     Heart sounds: Normal heart sounds.  Pulmonary:     Effort: Pulmonary effort is normal.     Breath sounds: Normal breath sounds.  Abdominal:     General: Bowel sounds are normal.     Palpations: Abdomen is soft.     Tenderness: There is abdominal tenderness in the left upper quadrant.     Comments: Guarding LUQ, TTP  Musculoskeletal: Normal range of motion.  Skin:    General: Skin is warm and dry.  Neurological:     Mental Status: She is alert and oriented to person, place, and time.      ED Treatments / Results  Labs (all labs ordered are listed, but only abnormal results are displayed) Labs Reviewed - No data to display  EKG None  Radiology No results found.  Procedures Procedures (including critical care time)  Medications Ordered in ED Medications    HYDROmorphone (DILAUDID) injection 1 mg (1 mg Intravenous Given 03/07/18 2323)     Initial Impression / Assessment and Plan / ED Course  I have reviewed the triage vital signs and the nursing notes.  Pertinent labs & imaging results that were available during my care of the patient were reviewed by me and considered in my medical decision making (see chart for details).  53 year old female transferred here from [facility] due to CT findings concerning from perforated ulcer.  She had sudden onset of pain around 5 PM.  No vomiting or fevers.  On arrival to ED she is still complaining of pain and appears very uncomfortable.  Vitals are stable.  She was given additional Dilaudid.  General surgery, Dr. Grandville Silos, was paged and notified of patient's arrival in the ED.  He will take to the OR.  Final Clinical Impressions(s) / ED Diagnoses   Final diagnoses:  Perforated ulcer [facility])    ED Discharge Orders    None       Larene Pickett, PA-C 03/08/18 Lamar Blinks, MD 03/08/18 1308

## 2018-03-07 NOTE — H&P (Signed)
[name] is an 53 y.o. female.   Chief Complaint: abdominal pain HPI: Patient transferred from [facility] with free air.  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  She developed acute onset of severe abdominal pain around 5 PM today.  She was evaluated at [facility] and CT scan of the abdomen and pelvis showed free air likely due to a perforated ulcer.  She was transferred for surgical care.  Past Medical History:  Diagnosis Date  . Allergy   . Anemia   . Anxiety   . Asthma   . Depression   . GERD (gastroesophageal reflux disease)   . Heart murmur     Past Surgical History:  Procedure Laterality Date  . ABDOMINAL HYSTERECTOMY     fibroids  . GASTRIC BYPASS    . TUBAL LIGATION      Family History  Problem Relation Age of Onset  . Hypertension Father   . Cancer Father        Prostate  . Depression Sister   . Depression Brother   . Diabetes Maternal Grandmother   . Diabetes Maternal Grandfather   . Liver disease Maternal Grandfather   . Cancer Maternal Grandfather        Prostate  . Diabetes Paternal Grandmother   . Diabetes Paternal Grandfather   . Cancer Paternal Grandfather        Lung, Prostate  . Stroke Mother    Social History:  reports that she quit smoking about 31 years ago. She has never used smokeless tobacco. She reports current alcohol use. She reports that she does not use drugs.  Allergies:  Allergies  Allergen Reactions  . Codeine Itching  . Penicillins Other (See Comments)    Did it involve swelling of the face/tongue/throat, SOB, or low BP? Unk Did it involve sudden or severe rash/hives, skin peeling, or any reaction on the inside of your mouth or nose? Unk Did you need to seek medical attention at a hospital or doctor's office? Unk When did it last happen? Was 53 years old; reaction not recalled If all above answers are "NO", may proceed with cephalosporin use.     (Not in a hospital  admission)   No results found for this or any previous visit (from the past 48 hour(s)). No results found.  Review of Systems  Constitutional: Negative for chills and fever.  HENT: Negative.   Eyes: Negative.   Respiratory: Negative.   Cardiovascular: Negative.   Gastrointestinal: Positive for abdominal pain. Negative for diarrhea, nausea and vomiting.  Genitourinary: Negative.   Musculoskeletal: Negative.   Skin: Negative.   Neurological: Negative.   Endo/Heme/Allergies: Negative.   Psychiatric/Behavioral: Negative.     Blood pressure 140/89, pulse 84, temperature (!) 97.2 F (36.2 C), temperature source Oral, resp. rate 20, SpO2 100 %. Physical Exam  Constitutional: She is oriented to person, place, and time. She appears well-developed and well-nourished.  HENT:  Head: Normocephalic.  Right Ear: External ear normal.  Mouth/Throat: Oropharynx is clear and moist.  Eyes: Pupils are equal, round, and reactive to light. EOM are normal.  Neck: No tracheal deviation present. No thyromegaly present.  Cardiovascular: Normal rate, regular rhythm and normal heart sounds.  Respiratory: Effort normal and breath sounds normal. No respiratory distress. She has no wheezes.  GI: Soft. She exhibits no distension. There is abdominal tenderness. There is rebound and guarding.  Abdomen is tender with peritoneal signs  Musculoskeletal: Normal range of motion.  General: No edema.  Neurological: She is alert and oriented to person, place, and time. She exhibits normal muscle tone.  Skin: Skin is warm.  Psychiatric: She has a normal mood and affect.     Assessment/Plan Free intraperitoneal air likely due to perforated ulcer - IV antibiotics, 2 OR for emergent exploratory laparotomy and repair.  I discussed the procedure, risks, and benefits with her.  She is agreeable.  Zenovia Jarred, MD 03/07/2018, 11:41 PM

## 2018-03-08 ENCOUNTER — Other Ambulatory Visit: Payer: Self-pay

## 2018-03-08 ENCOUNTER — Encounter (HOSPITAL_COMMUNITY): Admission: EM | Disposition: A | Discharge status: Home / Self Care | Payer: Self-pay | Source: Home / Self Care

## 2018-03-08 ENCOUNTER — Inpatient Hospital Stay (HOSPITAL_COMMUNITY): Payer: BLUE CROSS/BLUE SHIELD | Admitting: Certified Registered"

## 2018-03-08 ENCOUNTER — Encounter (HOSPITAL_COMMUNITY): Payer: Self-pay | Admitting: Certified Registered"

## 2018-03-08 DIAGNOSIS — F329 Major depressive disorder, single episode, unspecified: Secondary | ICD-10-CM | POA: Diagnosis present

## 2018-03-08 DIAGNOSIS — Z87891 Personal history of nicotine dependence: Secondary | ICD-10-CM | POA: Diagnosis not present

## 2018-03-08 DIAGNOSIS — K275 Chronic or unspecified peptic ulcer, site unspecified, with perforation: Secondary | ICD-10-CM | POA: Diagnosis present

## 2018-03-08 DIAGNOSIS — K285 Chronic or unspecified gastrojejunal ulcer with perforation: Secondary | ICD-10-CM | POA: Diagnosis not present

## 2018-03-08 DIAGNOSIS — K668 Other specified disorders of peritoneum: Secondary | ICD-10-CM | POA: Diagnosis present

## 2018-03-08 DIAGNOSIS — K219 Gastro-esophageal reflux disease without esophagitis: Secondary | ICD-10-CM | POA: Diagnosis present

## 2018-03-08 DIAGNOSIS — K669 Disorder of peritoneum, unspecified: Secondary | ICD-10-CM | POA: Diagnosis not present

## 2018-03-08 DIAGNOSIS — Z7951 Long term (current) use of inhaled steroids: Secondary | ICD-10-CM | POA: Diagnosis not present

## 2018-03-08 DIAGNOSIS — Z23 Encounter for immunization: Secondary | ICD-10-CM | POA: Diagnosis not present

## 2018-03-08 DIAGNOSIS — Z9884 Bariatric surgery status: Secondary | ICD-10-CM | POA: Diagnosis not present

## 2018-03-08 DIAGNOSIS — Z885 Allergy status to narcotic agent status: Secondary | ICD-10-CM | POA: Diagnosis not present

## 2018-03-08 DIAGNOSIS — F419 Anxiety disorder, unspecified: Secondary | ICD-10-CM | POA: Diagnosis present

## 2018-03-08 DIAGNOSIS — R109 Unspecified abdominal pain: Secondary | ICD-10-CM | POA: Diagnosis present

## 2018-03-08 DIAGNOSIS — Z79899 Other long term (current) drug therapy: Secondary | ICD-10-CM | POA: Diagnosis not present

## 2018-03-08 DIAGNOSIS — J45909 Unspecified asthma, uncomplicated: Secondary | ICD-10-CM | POA: Diagnosis present

## 2018-03-08 DIAGNOSIS — Z818 Family history of other mental and behavioral disorders: Secondary | ICD-10-CM | POA: Diagnosis not present

## 2018-03-08 DIAGNOSIS — Z90710 Acquired absence of both cervix and uterus: Secondary | ICD-10-CM | POA: Diagnosis not present

## 2018-03-08 DIAGNOSIS — Z880 Allergy status to penicillin: Secondary | ICD-10-CM | POA: Diagnosis not present

## 2018-03-08 HISTORY — PX: LAPAROTOMY: SHX154

## 2018-03-08 LAB — CBC
HCT: 35.6 % — ABNORMAL LOW (ref 36.0–46.0)
HEMOGLOBIN: 11 g/dL — AB (ref 12.0–15.0)
MCH: 27.3 pg (ref 26.0–34.0)
MCHC: 30.9 g/dL (ref 30.0–36.0)
MCV: 88.3 fL (ref 80.0–100.0)
Platelets: 214 10*3/uL (ref 150–400)
RBC: 4.03 MIL/uL (ref 3.87–5.11)
RDW: 14.3 % (ref 11.5–15.5)
WBC: 8 10*3/uL (ref 4.0–10.5)
nRBC: 0 % (ref 0.0–0.2)

## 2018-03-08 LAB — BASIC METABOLIC PANEL
Anion gap: 9 (ref 5–15)
BUN: 13 mg/dL (ref 6–20)
CHLORIDE: 111 mmol/L (ref 98–111)
CO2: 20 mmol/L — AB (ref 22–32)
Calcium: 8.1 mg/dL — ABNORMAL LOW (ref 8.9–10.3)
Creatinine, Ser: 0.72 mg/dL (ref 0.44–1.00)
GFR calc Af Amer: >60 mL/min (ref >60)
GFR calc non Af Amer: >60 mL/min (ref >60)
GLUCOSE: 149 mg/dL — AB (ref 70–99)
Potassium: 4 mmol/L (ref 3.5–5.1)
Sodium: 140 mmol/L (ref 135–145)

## 2018-03-08 LAB — POCT I-STAT 4, (NA,K, GLUC, HGB,HCT)
Glucose, Bld: 130 mg/dL — ABNORMAL HIGH (ref 70–99)
HCT: 33 % — ABNORMAL LOW (ref 36.0–46.0)
Hemoglobin: 11.2 g/dL — ABNORMAL LOW (ref 12.0–15.0)
POTASSIUM: 3.6 mmol/L (ref 3.5–5.1)
SODIUM: 139 mmol/L (ref 135–145)

## 2018-03-08 LAB — GRAM STAIN

## 2018-03-08 SURGERY — LAPAROTOMY, EXPLORATORY
Anesthesia: General | Site: Abdomen

## 2018-03-08 MED ORDER — FOLIC ACID 1 MG PO TABS
1.0000 mg | ORAL_TABLET | Freq: Every day | ORAL | Status: DC
  Administered 2018-03-10 – 2018-03-12 (×3): 1 mg via ORAL
  Filled 2018-03-08 (×3): qty 1

## 2018-03-08 MED ORDER — LORAZEPAM 1 MG PO TABS: 1.0000 mg | ORAL_TABLET | Freq: Four times a day (QID) | ORAL | Status: AC | PRN

## 2018-03-08 MED ORDER — THIAMINE HCL 100 MG/ML IJ SOLN
100.0000 mg | Freq: Every day | INTRAMUSCULAR | Status: DC
  Administered 2018-03-09: 100 mg via INTRAVENOUS
  Filled 2018-03-08: qty 2

## 2018-03-08 MED ORDER — ONDANSETRON HCL 4 MG/2ML IJ SOLN
INTRAMUSCULAR | Status: DC | PRN
  Administered 2018-03-08: 4 mg via INTRAVENOUS

## 2018-03-08 MED ORDER — LORAZEPAM 2 MG/ML IJ SOLN: 1.0000 mg | Freq: Four times a day (QID) | INTRAMUSCULAR | Status: AC | PRN

## 2018-03-08 MED ORDER — DEXAMETHASONE SODIUM PHOSPHATE 10 MG/ML IJ SOLN
INTRAMUSCULAR | Status: DC | PRN
  Administered 2018-03-08: 10 mg via INTRAVENOUS

## 2018-03-08 MED ORDER — SUFENTANIL CITRATE 50 MCG/ML IV SOLN
INTRAVENOUS | Status: AC
Start: 2018-03-08 — End: ?
  Filled 2018-03-08: qty 1

## 2018-03-08 MED ORDER — LORAZEPAM 2 MG/ML IJ SOLN: 0.0000 mg | Freq: Four times a day (QID) | INTRAMUSCULAR | Status: AC

## 2018-03-08 MED ORDER — FENTANYL CITRATE (PF) 100 MCG/2ML IJ SOLN
25.0000 ug | INTRAMUSCULAR | Status: DC | PRN
  Administered 2018-03-08 (×2): 50 ug via INTRAVENOUS

## 2018-03-08 MED ORDER — METHOCARBAMOL 1000 MG/10ML IJ SOLN
1000.0000 mg | Freq: Three times a day (TID) | INTRAVENOUS | Status: DC | PRN
  Administered 2018-03-08 (×2): 1000 mg via INTRAVENOUS
  Filled 2018-03-08 (×3): qty 10

## 2018-03-08 MED ORDER — HYDROMORPHONE HCL 1 MG/ML IJ SOLN
1.0000 mg | INTRAMUSCULAR | Status: DC | PRN
  Administered 2018-03-08 – 2018-03-09 (×5): 2 mg via INTRAVENOUS
  Administered 2018-03-10 – 2018-03-11 (×2): 1 mg via INTRAVENOUS
  Administered 2018-03-11 – 2018-03-12 (×2): 2 mg via INTRAVENOUS
  Filled 2018-03-08 (×2): qty 2
  Filled 2018-03-08: qty 1
  Filled 2018-03-08 (×2): qty 2
  Filled 2018-03-08: qty 1
  Filled 2018-03-08: qty 2
  Filled 2018-03-08: qty 1
  Filled 2018-03-08 (×2): qty 2

## 2018-03-08 MED ORDER — ROCURONIUM BROMIDE 50 MG/5ML IV SOSY
PREFILLED_SYRINGE | INTRAVENOUS | Status: AC
  Filled 2018-03-08: qty 5

## 2018-03-08 MED ORDER — ONDANSETRON HCL 4 MG/2ML IJ SOLN
4.0000 mg | Freq: Four times a day (QID) | INTRAMUSCULAR | Status: DC | PRN
  Administered 2018-03-10: 4 mg via INTRAVENOUS
  Filled 2018-03-08: qty 2

## 2018-03-08 MED ORDER — DIPHENHYDRAMINE HCL 50 MG/ML IJ SOLN
12.5000 mg | Freq: Four times a day (QID) | INTRAMUSCULAR | Status: DC | PRN
  Administered 2018-03-08: 12.5 mg via INTRAVENOUS
  Filled 2018-03-08: qty 1

## 2018-03-08 MED ORDER — CIPROFLOXACIN IN D5W 400 MG/200ML IV SOLN
400.0000 mg | Freq: Two times a day (BID) | INTRAVENOUS | Status: AC
  Administered 2018-03-08 – 2018-03-09 (×3): 400 mg via INTRAVENOUS
  Filled 2018-03-08 (×3): qty 200

## 2018-03-08 MED ORDER — LACTATED RINGERS IV SOLN
INTRAVENOUS | Status: DC | PRN
  Administered 2018-03-08 (×2): via INTRAVENOUS

## 2018-03-08 MED ORDER — PANTOPRAZOLE SODIUM 40 MG IV SOLR
40.0000 mg | Freq: Two times a day (BID) | INTRAVENOUS | Status: DC
  Administered 2018-03-08 – 2018-03-09 (×3): 40 mg via INTRAVENOUS
  Filled 2018-03-08 (×3): qty 40

## 2018-03-08 MED ORDER — ROCURONIUM BROMIDE 100 MG/10ML IV SOLN
INTRAVENOUS | Status: DC | PRN
  Administered 2018-03-08: 30 mg via INTRAVENOUS

## 2018-03-08 MED ORDER — ONDANSETRON HCL 4 MG/2ML IJ SOLN
INTRAMUSCULAR | Status: AC
  Filled 2018-03-08: qty 2

## 2018-03-08 MED ORDER — ONDANSETRON 4 MG PO TBDP: 4.0000 mg | ORAL_TABLET | Freq: Four times a day (QID) | ORAL | Status: DC | PRN

## 2018-03-08 MED ORDER — ENOXAPARIN SODIUM 40 MG/0.4ML ~~LOC~~ SOLN
40.0000 mg | SUBCUTANEOUS | Status: DC
  Administered 2018-03-09 – 2018-03-11 (×3): 40 mg via SUBCUTANEOUS
  Filled 2018-03-08 (×3): qty 0.4

## 2018-03-08 MED ORDER — LIDOCAINE 2% (20 MG/ML) 5 ML SYRINGE
INTRAMUSCULAR | Status: AC
  Filled 2018-03-08: qty 5

## 2018-03-08 MED ORDER — VITAMIN B-1 100 MG PO TABS
100.0000 mg | ORAL_TABLET | Freq: Every day | ORAL | Status: DC
  Administered 2018-03-10 – 2018-03-12 (×3): 100 mg via ORAL
  Filled 2018-03-08 (×3): qty 1

## 2018-03-08 MED ORDER — MIDAZOLAM HCL 5 MG/5ML IJ SOLN
INTRAMUSCULAR | Status: DC | PRN
  Administered 2018-03-08: 2 mg via INTRAVENOUS

## 2018-03-08 MED ORDER — HYDROMORPHONE HCL 1 MG/ML IJ SOLN
1.0000 mg | Freq: Once | INTRAMUSCULAR | Status: AC
  Administered 2018-03-08: 1 mg via INTRAVENOUS

## 2018-03-08 MED ORDER — FENTANYL CITRATE (PF) 100 MCG/2ML IJ SOLN
INTRAMUSCULAR | Status: AC
  Filled 2018-03-08: qty 2

## 2018-03-08 MED ORDER — PROPOFOL 10 MG/ML IV BOLUS
INTRAVENOUS | Status: DC | PRN
  Administered 2018-03-08: 170 mg via INTRAVENOUS

## 2018-03-08 MED ORDER — ACETAMINOPHEN 10 MG/ML IV SOLN
1000.0000 mg | Freq: Four times a day (QID) | INTRAVENOUS | Status: AC
  Administered 2018-03-08 – 2018-03-09 (×4): 1000 mg via INTRAVENOUS
  Filled 2018-03-08 (×5): qty 100

## 2018-03-08 MED ORDER — SUGAMMADEX SODIUM 200 MG/2ML IV SOLN
INTRAVENOUS | Status: DC | PRN
  Administered 2018-03-08: 100 mg via INTRAVENOUS

## 2018-03-08 MED ORDER — DEXAMETHASONE SODIUM PHOSPHATE 10 MG/ML IJ SOLN
INTRAMUSCULAR | Status: AC
  Filled 2018-03-08: qty 1

## 2018-03-08 MED ORDER — SUFENTANIL CITRATE 50 MCG/ML IV SOLN
INTRAVENOUS | Status: DC | PRN
  Administered 2018-03-08: 20 ug via INTRAVENOUS
  Administered 2018-03-08 (×3): 10 ug via INTRAVENOUS

## 2018-03-08 MED ORDER — LORAZEPAM 2 MG/ML IJ SOLN: 0.0000 mg | Freq: Two times a day (BID) | INTRAMUSCULAR | Status: AC

## 2018-03-08 MED ORDER — LIDOCAINE HCL (CARDIAC) PF 100 MG/5ML IV SOSY
PREFILLED_SYRINGE | INTRAVENOUS | Status: DC | PRN
  Administered 2018-03-08: 100 mg via INTRATRACHEAL

## 2018-03-08 MED ORDER — SUCCINYLCHOLINE CHLORIDE 20 MG/ML IJ SOLN
INTRAMUSCULAR | Status: DC | PRN
  Administered 2018-03-08: 120 mg via INTRAVENOUS

## 2018-03-08 MED ORDER — PROPOFOL 10 MG/ML IV BOLUS
INTRAVENOUS | Status: AC
Start: 2018-03-08 — End: ?
  Filled 2018-03-08: qty 20

## 2018-03-08 MED ORDER — SODIUM CHLORIDE (PF) 0.9 % IJ SOLN
INTRAMUSCULAR | Status: AC
  Filled 2018-03-08: qty 10

## 2018-03-08 MED ORDER — MIDAZOLAM HCL 2 MG/2ML IJ SOLN
INTRAMUSCULAR | Status: AC
  Filled 2018-03-08: qty 2

## 2018-03-08 MED ORDER — INFLUENZA VAC SPLIT QUAD 0.5 ML IM SUSY
0.5000 mL | PREFILLED_SYRINGE | INTRAMUSCULAR | Status: AC
  Administered 2018-03-12: 0.5 mL via INTRAMUSCULAR
  Filled 2018-03-08: qty 0.5

## 2018-03-08 MED ORDER — 0.9 % SODIUM CHLORIDE (POUR BTL) OPTIME
TOPICAL | Status: DC | PRN
  Administered 2018-03-08 (×4): 1000 mL

## 2018-03-08 MED ORDER — ADULT MULTIVITAMIN W/MINERALS CH
1.0000 | ORAL_TABLET | Freq: Every day | ORAL | Status: DC
  Administered 2018-03-10 – 2018-03-12 (×3): 1 via ORAL
  Filled 2018-03-08 (×4): qty 1

## 2018-03-08 MED ORDER — SUCCINYLCHOLINE CHLORIDE 200 MG/10ML IV SOSY
PREFILLED_SYRINGE | INTRAVENOUS | Status: AC
  Filled 2018-03-08: qty 10

## 2018-03-08 MED ORDER — HYDROMORPHONE HCL 1 MG/ML IJ SOLN
1.0000 mg | INTRAMUSCULAR | Status: DC | PRN
  Administered 2018-03-08: 1 mg via INTRAVENOUS
  Filled 2018-03-08: qty 1

## 2018-03-08 SURGICAL SUPPLY — 45 items
BLADE CLIPPER SURG (BLADE) IMPLANT
CANISTER SUCT 3000ML PPV (MISCELLANEOUS) ×2 IMPLANT
CHLORAPREP W/TINT 26ML (MISCELLANEOUS) ×2 IMPLANT
COVER SURGICAL LIGHT HANDLE (MISCELLANEOUS) ×2 IMPLANT
COVER WAND RF STERILE (DRAPES) ×2 IMPLANT
DRAIN CHANNEL 19F RND (DRAIN) ×1 IMPLANT
DRAPE LAPAROSCOPIC ABDOMINAL (DRAPES) ×2 IMPLANT
DRAPE WARM FLUID 44X44 (DRAPE) ×2 IMPLANT
DRSG OPSITE POSTOP 4X10 (GAUZE/BANDAGES/DRESSINGS) IMPLANT
DRSG OPSITE POSTOP 4X8 (GAUZE/BANDAGES/DRESSINGS) ×1 IMPLANT
ELECT BLADE 6.5 EXT (BLADE) IMPLANT
ELECT CAUTERY BLADE 6.4 (BLADE) ×2 IMPLANT
ELECT REM PT RETURN 9FT ADLT (ELECTROSURGICAL) ×2
ELECTRODE REM PT RTRN 9FT ADLT (ELECTROSURGICAL) ×1 IMPLANT
EVACUATOR SILICONE 100CC (DRAIN) ×1 IMPLANT
GLOVE BIO SURGEON STRL SZ8 (GLOVE) ×2 IMPLANT
GLOVE BIOGEL PI IND STRL 8 (GLOVE) ×1 IMPLANT
GLOVE BIOGEL PI INDICATOR 8 (GLOVE) ×1
GOWN STRL REUS W/ TWL LRG LVL3 (GOWN DISPOSABLE) ×1 IMPLANT
GOWN STRL REUS W/ TWL XL LVL3 (GOWN DISPOSABLE) ×1 IMPLANT
GOWN STRL REUS W/TWL LRG LVL3 (GOWN DISPOSABLE) ×2
GOWN STRL REUS W/TWL XL LVL3 (GOWN DISPOSABLE) ×2
KIT BASIN OR (CUSTOM PROCEDURE TRAY) ×2 IMPLANT
KIT TURNOVER KIT B (KITS) ×2 IMPLANT
LIGASURE IMPACT 36 18CM CVD LR (INSTRUMENTS) IMPLANT
NS IRRIG 1000ML POUR BTL (IV SOLUTION) ×4 IMPLANT
PACK GENERAL/GYN (CUSTOM PROCEDURE TRAY) ×2 IMPLANT
PAD ARMBOARD 7.5X6 YLW CONV (MISCELLANEOUS) ×2 IMPLANT
PENCIL SMOKE EVACUATOR (MISCELLANEOUS) ×3 IMPLANT
SPECIMEN JAR LARGE (MISCELLANEOUS) IMPLANT
SPONGE DRAIN TRACH 4X4 STRL 2S (GAUZE/BANDAGES/DRESSINGS) ×1 IMPLANT
SPONGE LAP 18X18 X RAY DECT (DISPOSABLE) IMPLANT
STAPLER VISISTAT 35W (STAPLE) ×2 IMPLANT
SUCTION POOLE TIP (SUCTIONS) ×2 IMPLANT
SUT ETHILON 2 0 FS 18 (SUTURE) ×1 IMPLANT
SUT PDS AB 1 TP1 96 (SUTURE) ×4 IMPLANT
SUT SILK 2 0 SH CR/8 (SUTURE) ×2 IMPLANT
SUT SILK 2 0 TIES 10X30 (SUTURE) ×2 IMPLANT
SUT SILK 3 0 SH CR/8 (SUTURE) ×2 IMPLANT
SUT SILK 3 0 TIES 10X30 (SUTURE) ×2 IMPLANT
SWAB CULTURE ESWAB REG 1ML (MISCELLANEOUS) ×2 IMPLANT
TOWEL OR 17X26 10 PK STRL BLUE (TOWEL DISPOSABLE) ×2 IMPLANT
TRAY FOLEY MTR SLVR 14FR STAT (SET/KITS/TRAYS/PACK) ×1 IMPLANT
TRAY FOLEY MTR SLVR 16FR STAT (SET/KITS/TRAYS/PACK) IMPLANT
YANKAUER SUCT BULB TIP NO VENT (SUCTIONS) IMPLANT

## 2018-03-08 NOTE — Op Note (Signed)
03/08/2018  2:07 AM  PATIENT:  [name]  53 y.o. female  PRE-OPERATIVE DIAGNOSIS:  free air  POST-OPERATIVE DIAGNOSIS:  Perforated marginal ulcer  PROCEDURE:  Procedure(s): EXPLORATORY LAPAROTOMY, REPAIR AND PATCH CLOSURE OF PERFORATED ULCER  SURGEON:  Surgeon(s): Georganna Skeans, MD  ASSISTANTS: none   ANESTHESIA:   general  EBL:  Total I/O In: 1000 [I.V.:1000] Out: 77 [Urine:40; Blood:5]  BLOOD ADMINISTERED:none  DRAINS: (1) Jackson-Pratt drain(s) with closed bulb suction in the LUQ   SPECIMEN:  No Specimen  DISPOSITION OF SPECIMEN:  N/A  COUNTS:  YES  DICTATION: .Dragon Dictation Findings: Small perforated marginal ulcer 5 mm  Procedure in detail: Ms. Romanek is brought for emergent exploratory laparotomy due to likely perforated ulcer.  She received intravenous antibiotics.  Informed consent was obtained.  She was brought to the operating room and general endotracheal anesthesia was administered by the anesthesia staff.  Foley was placed by nursing.  Abdomen was prepped and draped in sterile fashion.  We did a timeout procedure.  Upper midline incision was made.  Subcutaneous tissues were dissected down revealing the anterior fascia.  Abdomen was entered under direct vision.  The fascia was opened to the length of the incision.  Exploration revealed a large amount of cloudy fluid in the upper abdomen.  This was sent for culture and then evacuated.  Exploration revealed a small perforated marginal ulcer along her gastrojejunal anastomosis.  I closed the ulcer with interrupted 2-0 silk sutures.  I left the tails long.  I then brought a tail of omentum up and laid it over the closure.  It was tied down securely with a silk sutures achieving a good closure.  The abdomen was irrigated with multiple liters of warm saline.  I placed a 11 [place] Blake drain up into the left upper quadrant.  This was secured with nylon.  Hemostasis was ensured.  The closure was intact.  Fascia was  closed with running #1 looped PDS and the skin was closed with staples loosely.  All counts were correct.  She tolerated the procedure well without apparent complication was taken recovery in stable condition. PATIENT DISPOSITION:  PACU - hemodynamically stable.   Delay start of Pharmacological VTE agent (>24hrs) due to surgical blood loss or risk of bleeding:  no  Georganna Skeans, MD, MPH, FACS Pager: [phone]  2/14/20202:07 AM

## 2018-03-08 NOTE — Anesthesia Procedure Notes (Signed)
Procedure Name: Intubation Date/Time: 03/08/2018 1:09 AM Performed by: Claris Che, CRNA Pre-anesthesia Checklist: Patient identified, Emergency Drugs available, Suction available, Patient being monitored and Timeout performed Patient Re-evaluated:Patient Re-evaluated prior to induction Oxygen Delivery Method: Circle system utilized Preoxygenation: Pre-oxygenation with 100% oxygen Induction Type: IV induction, Rapid sequence and Cricoid Pressure applied Laryngoscope Size: Mac and 3 Grade View: Grade II Tube type: Oral Tube size: 7.5 mm Number of attempts: 1 Airway Equipment and Method: Stylet Placement Confirmation: ETT inserted through vocal cords under direct vision,  positive ETCO2 and breath sounds checked- equal and bilateral Secured at: 22 cm Tube secured with: Tape Dental Injury: Teeth and Oropharynx as per pre-operative assessment

## 2018-03-08 NOTE — Anesthesia Postprocedure Evaluation (Signed)
Anesthesia Post Note  Patient: [name]  Procedure(s) Performed: EXPLORATORY LAPAROTOMY, REPAIR AND PATCH CLOSURE OF PERFORATED ULCER (N/A Abdomen)     Patient location during evaluation: PACU Anesthesia Type: General Level of consciousness: awake Pain management: pain level controlled Vital Signs Assessment: post-procedure vital signs reviewed and stable Respiratory status: spontaneous breathing Cardiovascular status: stable Postop Assessment: no apparent nausea or vomiting Anesthetic complications: no    Last Vitals:  Vitals:   03/08/18 0000 03/08/18 0215  BP: 124/77 (!) (P) 155/90  Pulse: 80 (P) 85  Resp:  (P) 14  Temp:  (P) 36.6 C  SpO2: 100% (P) 98%    Last Pain:  Vitals:   03/07/18 2324  TempSrc:   PainSc: 10-Worst pain ever                 Alaura Schippers

## 2018-03-08 NOTE — Transfer of Care (Signed)
Immediate Anesthesia Transfer of Care Note  Patient: [name]  Procedure(s) Performed: EXPLORATORY LAPAROTOMY, REPAIR AND PATCH CLOSURE OF PERFORATED ULCER (N/A Abdomen)  Patient Location: PACU  Anesthesia Type:General  Level of Consciousness: oriented, sedated, drowsy and patient cooperative  Airway & Oxygen Therapy: Patient Spontanous Breathing and Patient connected to nasal cannula oxygen  Post-op Assessment: Report given to RN, Post -op Vital signs reviewed and stable and Patient moving all extremities X 4  Post vital signs: Reviewed and stable  Last Vitals:  Vitals Value Taken Time  BP    Temp    Pulse    Resp    SpO2      Last Pain:  Vitals:   03/07/18 2324  TempSrc:   PainSc: 10-Worst pain ever         Complications: No apparent anesthesia complications

## 2018-03-08 NOTE — Progress Notes (Signed)
[facility] Progress Note  Day of Surgery  Subjective: CC-  Uncomfortable this morning. States that she's having a lot of abdominal pain. Denies n/v. No flatus or BM. Denies abdominal bloating.  Patient states that she was having no abdominal pain leading up to yesterday. States that she has been having issues with neck pain, and she received a steroid injection on Tuesday for this. She has also been taking some ibuprofen almost every day over the last week.  Objective: Vital signs in last 24 hours: Temp:  [97.2 F (36.2 C)-98.6 F (37 C)] 98.1 F (36.7 C) (02/14 0728) Pulse Rate:  [66-85] 66 (02/14 0728) Resp:  [9-20] 14 (02/14 0728) BP: (124-172)/(75-94) 150/88 (02/14 0728) SpO2:  [93 %-100 %] 98 % (02/14 0728) Weight:  [57.7 kg] 57.7 kg (02/14 0327)    Intake/Output from previous day: 02/13 0701 - 02/14 0700 In: 1780 [I.V.:1700; NG/GT:30; IV Piggyback:50] Out: 115 [Urine:40; Drains:70; Blood:5] Intake/Output this shift: No intake/output data recorded.  PE: Gen:  Alert, NAD, pleasant Card:  RRR Pulm:  effort normal Abd: Soft, mild distension, mild diffuse tenderness, hypoactive BS, incision with honeycomb dressing in place, drain with serosanguinous output Psych: A&Ox3  Skin: no rashes noted, warm and dry  Lab Results:  Recent Labs    03/08/18 0345  WBC 8.0  HGB 11.0*  HCT 35.6*  PLT 214   BMET Recent Labs    03/08/18 0345  NA 140  K 4.0  CL 111  CO2 20*  GLUCOSE 149*  BUN 13  CREATININE 0.72  CALCIUM 8.1*   PT/INR No results for input(s): LABPROT, INR in the last 72 hours. CMP     Component Value Date/Time   NA 140 03/08/2018 0345   K 4.0 03/08/2018 0345   CL 111 03/08/2018 0345   CO2 20 (L) 03/08/2018 0345   GLUCOSE 149 (H) 03/08/2018 0345   GLUCOSE 84 01/08/2006 1511   BUN 13 03/08/2018 0345   CREATININE 0.72 03/08/2018 0345   CALCIUM 8.1 (L) 03/08/2018 0345   PROT 6.8 05/03/2017 0919   ALBUMIN 3.8 05/03/2017 0919   AST  15 05/03/2017 0919   ALT 10 05/03/2017 0919   ALKPHOS 42 05/03/2017 0919   BILITOT 0.6 05/03/2017 0919   GFRNONAA >60 03/08/2018 0345   GFRAA >60 03/08/2018 0345   Lipase     Component Value Date/Time   LIPASE 39.0 02/29/2012 1639       Studies/Results: No results found.  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 Grandville Silos - POD 0/1 - when taking POs will need either liquid PPI or crushed PPI tablet, and Carafate qac for ulcer  ID - cipro 2/14>> (needs 24 hours abx) FEN - IVF, NPO/NGT VTE - SCDs, lovenox Foley - d/c today Follow up - Dr. Grandville Silos  Plan - IV tylenol for better pain control. Continue NPO/NGT and await return in bowel function. Needs to get OOB/mobilize today. PT consult. Labs in AM.   LOS: 0 days    Wellington Hampshire , [facility] 03/08/2018, 8:56 AM Pager: [phone] Mon-Thurs 7:00 am-4:30 pm Fri 7:00 am -11:30 AM Sat-Sun 7:00 am-11:30 am

## 2018-03-08 NOTE — ED Notes (Signed)
Pt transported to OR with this RN

## 2018-03-08 NOTE — Progress Notes (Signed)
Dr. Grandville Silos notified of gram stain results.

## 2018-03-08 NOTE — Anesthesia Preprocedure Evaluation (Signed)
Anesthesia Evaluation  Patient identified by MRN, date of birth, ID band Patient awake    Reviewed: Allergy & Precautions, NPO status , Patient's Chart, lab work & pertinent test results  Airway Mallampati: II  TM Distance: >3 FB     Dental   Pulmonary asthma , former smoker,    breath sounds clear to auscultation       Cardiovascular hypertension,  Rhythm:Regular Rate:Normal     Neuro/Psych    GI/Hepatic Neg liver ROS, GERD  ,History noted CG   Endo/Other  negative endocrine ROS  Renal/GU negative Renal ROS     Musculoskeletal  (+) Arthritis ,   Abdominal   Peds  Hematology  (+) anemia ,   Anesthesia Other Findings   Reproductive/Obstetrics                             Anesthesia Physical Anesthesia Plan  ASA: III  Anesthesia Plan: General   Post-op Pain Management:    Induction: Intravenous, Cricoid pressure planned and Rapid sequence  PONV Risk Score and Plan: Treatment may vary due to age or medical condition, Ondansetron, Dexamethasone and Midazolam  Airway Management Planned: Oral ETT  Additional Equipment:   Intra-op Plan:   Post-operative Plan: Possible Post-op intubation/ventilation  Informed Consent: I have reviewed the patients History and Physical, chart, labs and discussed the procedure including the risks, benefits and alternatives for the proposed anesthesia with the patient or authorized representative who has indicated his/her understanding and acceptance.     Dental advisory given  Plan Discussed with: CRNA, Anesthesiologist and Surgeon  Anesthesia Plan Comments:         Anesthesia Quick Evaluation

## 2018-03-08 NOTE — Evaluation (Signed)
Physical Therapy Evaluation Patient Details Name: [name] MRN: [id number] DOB: July 20, 1965 Today's Date: 03/08/2018   History of Present Illness  Pt is a 53 y.o. F s/p exploratory lapartomy, repair and patch closure of perforated ulcer 2/13  Clinical Impression  Pt admitted with above diagnosis. On PT evaluation, pt with some abdominal pain but very motivated to mobilize. Ambulating 200 feet without an assistive device and no apparent difficulty. Will follow acutely for stair training but suspect pt will progress quickly. No PT follow up recommended.    Follow Up Recommendations No PT follow up    Equipment Recommendations  None recommended by PT    Recommendations for Other Services       Precautions / Restrictions Precautions Precautions: Other (comment) Precaution Comments: drain Restrictions Weight Bearing Restrictions: No      Mobility  Bed Mobility Overal bed mobility: Independent                Transfers Overall transfer level: Independent Equipment used: None                Ambulation/Gait Ambulation/Gait assistance: Modified independent (Device/Increase time)((increased time)) Gait Distance (Feet): 200 Feet Assistive device: None Gait Pattern/deviations: WFL(Within Functional Limits)     General Gait Details: Slow speed but no evidence of imbalance  Stairs            Wheelchair Mobility    Modified Rankin (Stroke Patients Only)       Balance Overall balance assessment: No apparent balance deficits (not formally assessed)                                           Pertinent Vitals/Pain Pain Assessment: Faces Faces Pain Scale: Hurts little more Pain Location: abdomen Pain Descriptors / Indicators: Discomfort;Grimacing;Guarding Pain Intervention(s): Monitored during session    Home Living Family/patient expects to be discharged to:: Private residence Living Arrangements: Spouse/significant  other Available Help at Discharge: Family Type of Home: House Home Access: Stairs to enter   Technical brewer of Steps: 5(off deck) Home Layout: One level Home Equipment: None      Prior Function Level of Independence: Independent         Comments: Works two jobs, involves standing and heavy lifting. 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[employer], therapeutic        Extremity/Trunk Assessment   Upper Extremity Assessment Upper Extremity Assessment: Overall WFL for tasks assessed    Lower Extremity Assessment Lower Extremity Assessment: Overall WFL for tasks assessed       Communication   Communication: No difficulties  Cognition Arousal/Alertness: Awake/alert Behavior During Therapy: WFL for tasks assessed/performed Overall Cognitive Status: Within Functional Limits for tasks assessed                                        General Comments      Exercises     Assessment/Plan    PT Assessment Patient needs continued PT services  PT Problem List Decreased mobility;Pain       PT Treatment Interventions Stair training;Functional mobility training;Therapeutic activities;Therapeutic exercise;Balance training;Patient/family education    PT Goals (Current goals can be found in the Care Plan section)  Acute Rehab PT Goals Patient Stated Goal: "get back to work." PT Goal  Formulation: With patient Time For Goal Achievement: 03/22/18 Potential to Achieve Goals: Good    Frequency Min 3X/week   Barriers to discharge        Co-evaluation               AM-PAC PT "6 Clicks" Mobility  Outcome Measure Help needed turning from your back to your side while in a flat bed without using bedrails?: None Help needed moving from lying on your back to sitting on the side of a flat bed without using bedrails?: None Help needed moving to and from a bed to a chair (including a wheelchair)?: None Help needed standing up from a  chair using your arms (e.g., wheelchair or bedside chair)?: None Help needed to walk in hospital room?: None Help needed climbing 3-5 steps with a railing? : A Little 6 Click Score: 23    End of Session   Activity Tolerance: Patient tolerated treatment well Patient left: in chair;with call bell/phone within reach Nurse Communication: Mobility status PT Visit Diagnosis: Difficulty in walking, not elsewhere classified (R26.2);Pain Pain - part of body: (abdomen)    Time: 8182-9937 PT Time Calculation (min) (ACUTE ONLY): 34 min   Charges:   PT Evaluation $PT Eval Low Complexity: 1 Low PT Treatments $Therapeutic Activity: 8-22 mins      [name], PT, DPT Acute Rehabilitation Services Pager [phone] Office [phone]   [name] 03/08/2018, 3:57 PM

## 2018-03-09 ENCOUNTER — Encounter (HOSPITAL_COMMUNITY): Payer: Self-pay | Admitting: General Surgery

## 2018-03-09 LAB — CBC
HCT: 30.8 % — ABNORMAL LOW (ref 36.0–46.0)
Hemoglobin: 9.6 g/dL — ABNORMAL LOW (ref 12.0–15.0)
MCH: 27.6 pg (ref 26.0–34.0)
MCHC: 31.2 g/dL (ref 30.0–36.0)
MCV: 88.5 fL (ref 80.0–100.0)
Platelets: 208 10*3/uL (ref 150–400)
RBC: 3.48 MIL/uL — AB (ref 3.87–5.11)
RDW: 14.2 % (ref 11.5–15.5)
WBC: 9 10*3/uL (ref 4.0–10.5)
nRBC: 0 % (ref 0.0–0.2)

## 2018-03-09 LAB — BASIC METABOLIC PANEL
Anion gap: 8 (ref 5–15)
BUN: 8 mg/dL (ref 6–20)
CO2: 27 mmol/L (ref 22–32)
Calcium: 8.6 mg/dL — ABNORMAL LOW (ref 8.9–10.3)
Chloride: 102 mmol/L (ref 98–111)
Creatinine, Ser: 0.67 mg/dL (ref 0.44–1.00)
GFR calc Af Amer: >60 mL/min (ref >60)
GFR calc non Af Amer: >60 mL/min (ref >60)
Glucose, Bld: 127 mg/dL — ABNORMAL HIGH (ref 70–99)
Potassium: 4.7 mmol/L (ref 3.5–5.1)
Sodium: 137 mmol/L (ref 135–145)

## 2018-03-09 MED ORDER — METHOCARBAMOL 750 MG PO TABS
750.0000 mg | ORAL_TABLET | Freq: Three times a day (TID) | ORAL | Status: DC
  Administered 2018-03-09 – 2018-03-12 (×10): 750 mg via ORAL
  Filled 2018-03-09: qty 2
  Filled 2018-03-09 (×10): qty 1

## 2018-03-09 MED ORDER — PANTOPRAZOLE SODIUM 40 MG PO PACK
40.0000 mg | PACK | Freq: Two times a day (BID) | ORAL | Status: DC
  Administered 2018-03-09 – 2018-03-11 (×5): 40 mg via ORAL
  Filled 2018-03-09 (×8): qty 20

## 2018-03-09 MED ORDER — ACETAMINOPHEN 500 MG PO TABS
1000.0000 mg | ORAL_TABLET | Freq: Four times a day (QID) | ORAL | Status: DC
  Administered 2018-03-09 – 2018-03-12 (×11): 1000 mg via ORAL
  Filled 2018-03-09 (×13): qty 2

## 2018-03-09 NOTE — Progress Notes (Signed)
Patient ID: [name], female   DOB: Jan 26, 1965, 53 y.o.   MRN: [id number]    1 Day Post-Op  Subjective: Biggest complaint is throat pain from NGT.  Still with some abdominal discomfort.  Mobilized once yesterday to RN station.  Passing flatus, no BM.  Objective: Vital signs in last 24 hours: Temp:  [97.9 F (36.6 C)-98.4 F (36.9 C)] 98.3 F (36.8 C) (02/15 0918) Pulse Rate:  [76-89] 89 (02/15 0432) Resp:  [11-20] 11 (02/15 0800) BP: (111-140)/(69-92) 133/76 (02/15 0918) SpO2:  [94 %-99 %] 97 % (02/15 0432) Last BM Date: (pre surgery)  Intake/Output from previous day: 02/14 0701 - 02/15 0700 In: 550 [IV Piggyback:550] Out: 1200 [Urine:900; Drains:300] Intake/Output this shift: Total I/O In: -  Out: 250 [Emesis/NG output:150; Drains:100]  PE: Abd: soft, appropriately tender, +BS, incision is c/d/i with honeycomb, some old drainage  Lab Results:  Recent Labs    03/08/18 0345 03/09/18 0400  WBC 8.0 9.0  HGB 11.0* 9.6*  HCT 35.6* 30.8*  PLT 214 208   BMET Recent Labs    03/08/18 0345 03/09/18 0400  NA 140 137  K 4.0 4.7  CL 111 102  CO2 20* 27  GLUCOSE 149* 127*  BUN 13 8  CREATININE 0.72 0.67  CALCIUM 8.1* 8.6*   PT/INR No results for input(s): LABPROT, INR in the last 72 hours. CMP     Component Value Date/Time   NA 137 03/09/2018 0400   K 4.7 03/09/2018 0400   CL 102 03/09/2018 0400   CO2 27 03/09/2018 0400   GLUCOSE 127 (H) 03/09/2018 0400   GLUCOSE 84 01/08/2006 1511   BUN 8 03/09/2018 0400   CREATININE 0.67 03/09/2018 0400   CALCIUM 8.6 (L) 03/09/2018 0400   PROT 6.8 05/03/2017 0919   ALBUMIN 3.8 05/03/2017 0919   AST 15 05/03/2017 0919   ALT 10 05/03/2017 0919   ALKPHOS 42 05/03/2017 0919   BILITOT 0.6 05/03/2017 0919   GFRNONAA >60 03/09/2018 0400   GFRAA >60 03/09/2018 0400   Lipase     Component Value Date/Time   LIPASE 39.0 02/29/2012 1639       Studies/Results: No results found.  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 Grandville Silos - POD 1 - DC NGT -clear liquids -convert some meds to oral -mobilize  -pulm toilet -no PT follow up   ID - cipro 2/14>> (needs 24 hours abx) FEN - IVF, CLD VTE - SCDs, lovenox Foley - d/c today Follow up - Dr. Grandville Silos    LOS: 1 day    Henreitta Cea , [facility] 03/09/2018, 9:29 AM Pager: [phone]

## 2018-03-09 NOTE — Plan of Care (Signed)
  Problem: Coping: Goal: Level of anxiety will decrease Outcome: Progressing   Problem: Pain Managment: Goal: General experience of comfort will improve Outcome: Progressing   Problem: Safety: Goal: Ability to remain free from injury will improve Outcome: Progressing   Problem: Skin Integrity: Goal: Risk for impaired skin integrity will decrease Outcome: Progressing   

## 2018-03-10 LAB — BASIC METABOLIC PANEL
Anion gap: 5 (ref 5–15)
BUN: 7 mg/dL (ref 6–20)
CO2: 28 mmol/L (ref 22–32)
CREATININE: 0.63 mg/dL (ref 0.44–1.00)
Calcium: 8.3 mg/dL — ABNORMAL LOW (ref 8.9–10.3)
Chloride: 105 mmol/L (ref 98–111)
GFR calc Af Amer: >60 mL/min (ref >60)
GFR calc non Af Amer: >60 mL/min (ref >60)
GLUCOSE: 118 mg/dL — AB (ref 70–99)
Potassium: 3.5 mmol/L (ref 3.5–5.1)
Sodium: 138 mmol/L (ref 135–145)

## 2018-03-10 LAB — CBC
HCT: 33.2 % — ABNORMAL LOW (ref 36.0–46.0)
Hemoglobin: 10.4 g/dL — ABNORMAL LOW (ref 12.0–15.0)
MCH: 27.2 pg (ref 26.0–34.0)
MCHC: 31.3 g/dL (ref 30.0–36.0)
MCV: 86.9 fL (ref 80.0–100.0)
Platelets: 251 10*3/uL (ref 150–400)
RBC: 3.82 MIL/uL — ABNORMAL LOW (ref 3.87–5.11)
RDW: 14.1 % (ref 11.5–15.5)
WBC: 7.4 10*3/uL (ref 4.0–10.5)
nRBC: 0 % (ref 0.0–0.2)

## 2018-03-10 MED ORDER — SUCRALFATE 1 GM/10ML PO SUSP
1.0000 g | Freq: Three times a day (TID) | ORAL | Status: DC
  Administered 2018-03-10 – 2018-03-12 (×8): 1 g via ORAL
  Filled 2018-03-10 (×7): qty 10

## 2018-03-10 NOTE — Plan of Care (Signed)
  Problem: Nutrition: Goal: Adequate nutrition will be maintained Outcome: Progressing   Problem: Safety: Goal: Ability to remain free from injury will improve Outcome: Progressing   Problem: Skin Integrity: Goal: Risk for impaired skin integrity will decrease Outcome: Progressing   

## 2018-03-10 NOTE — Progress Notes (Signed)
2 Days Post-Op   Subjective/Chief Complaint: Abdominal pain     Patient feels better.  Tolerating clears and wants to eat more.   Objective: Vital signs in last 24 hours: Temp:  [98.9 F (37.2 C)-99.6 F (37.6 C)] 98.9 F (37.2 C) (02/16 0515) Pulse Rate:  [70-77] 70 (02/16 0515) Resp:  [16-18] 18 (02/16 0515) BP: (122-126)/(74-80) 122/74 (02/16 0515) SpO2:  [99 %-100 %] 99 % (02/16 0515) Weight:  [57.2 kg] 57.2 kg (02/15 1414) Last BM Date: 03/10/18  Intake/Output from previous day: 02/15 0701 - 02/16 0700 In: 480 [P.O.:480] Out: 496 [Urine:1; Emesis/NG output:150; Drains:345] Intake/Output this shift: Total I/O In: -  Out: 20 [Drains:20]  GI: Incision clean dry intact.  JP is serous.  No distention.  Honeycomb dressing in place  Lab Results:  Recent Labs    03/09/18 0400 03/10/18 0338  WBC 9.0 7.4  HGB 9.6* 10.4*  HCT 30.8* 33.2*  PLT 208 251   BMET Recent Labs    03/09/18 0400 03/10/18 0338  NA 137 138  K 4.7 3.5  CL 102 105  CO2 27 28  GLUCOSE 127* 118*  BUN 8 7  CREATININE 0.67 0.63  CALCIUM 8.6* 8.3*   PT/INR No results for input(s): LABPROT, INR in the last 72 hours. ABG No results for input(s): PHART, HCO3 in the last 72 hours.  Invalid input(s): PCO2, PO2  Studies/Results: No results found.  Anti-infectives: Anti-infectives (From admission, onward)   Start     Dose/Rate Route Frequency Ordered Stop   03/08/18 0600  ciprofloxacin (CIPRO) IVPB 400 mg     400 mg 200 mL/hr over 60 Minutes Intravenous Every 12 hours 03/08/18 0339 03/09/18 0645      Assessment/Plan: s/p Procedure(s): EXPLORATORY LAPAROTOMY, REPAIR AND PATCH CLOSURE OF PERFORATED ULCER (N/A) S/pRoux-en-Y gastric bypass by Dr. Hassell Done in 2007 Anxiety/depression Neck pain Alcohol use - CIWA  Perforated marginal ulcer S/pEXPLORATORY LAPAROTOMY, REPAIR AND PATCH CLOSURE OF PERFORATED ULCER2/13-14 Dr. Grandville Silos - POD 1 - DC NGT -Full liquids -convert some meds to  oral -mobilize  -pulm toilet -no PT follow up   ID -cipro 2/14>> (needs 24 hours abx) FEN -IVF, CLD VTE -SCDs, lovenox Foley -d/c today Follow up -Dr. Grandville Silos  LOS: 2 days    [name] 03/10/2018

## 2018-03-11 NOTE — Discharge Instructions (Addendum)
[facility], [place] [phone]  OPEN ABDOMINAL SURGERY: POST OP INSTRUCTIONS  Always review your discharge instruction sheet given to you by the facility where your surgery was performed.  IF YOU HAVE DISABILITY OR FAMILY LEAVE FORMS, YOU MUST BRING THEM TO THE OFFICE FOR PROCESSING.  PLEASE DO NOT GIVE THEM TO YOUR DOCTOR.  1. A prescription for pain medication may be given to you upon discharge.  Take your pain medication as prescribed, if needed.  If narcotic pain medicine is not needed, then you may take acetaminophen (Tylenol) or ibuprofen (Advil) as needed. 2. Take your usually prescribed medications unless otherwise directed. 3. If you need a refill on your pain medication, please contact your pharmacy. They will contact our office to request authorization.  Prescriptions will not be filled after 5pm or on week-ends. 4. You should follow a light diet the first few days after arrival home, such as soup and crackers, pudding, etc.unless your doctor has advised otherwise. A high-fiber, low fat diet can be resumed as tolerated.   Be sure to include lots of fluids daily. Most patients will experience some swelling and bruising on the chest and neck area.  Ice packs will help.  Swelling and bruising can take several days to resolve 5. Most patients will experience some swelling and bruising in the area of the incision. Ice pack will help. Swelling and bruising can take several days to resolve..  6. It is common to experience some constipation if taking pain medication after surgery.  Increasing fluid intake and taking a stool softener will usually help or prevent this problem from occurring.  A mild laxative (Milk of Magnesia or Miralax) should be taken according to package directions if there are no bowel movements after 48 hours. 7.  You may have steri-strips (small skin tapes) in place directly over the incision.  These strips should be left on the skin for 7-10 days.  If your  surgeon used skin glue on the incision, you may shower in 24 hours.  The glue will flake off over the next 2-3 weeks.  Any sutures or staples will be removed at the office during your follow-up visit. You may find that a light gauze bandage over your incision may keep your staples from being rubbed or pulled. You may shower and replace the bandage daily. 8. ACTIVITIES:  You may resume regular (light) daily activities beginning the next day--such as daily self-care, walking, climbing stairs--gradually increasing activities as tolerated.  You may have sexual intercourse when it is comfortable.  Refrain from any heavy lifting (nothing >15lbs) or straining until approved by your doctor. a. You may drive when you no longer are taking prescription pain medication, you can comfortably wear a seatbelt, and you can safely maneuver your car and apply brakes b. Return to Work: 6-8 weeks 9. You should see your doctor in the office for a follow-up appointment approximately two weeks after your surgery.  Make sure that you call for this appointment within a day or two after you arrive home to insure a convenient appointment time. OTHER INSTRUCTIONS:  _____________________________________________________________ _____________________________________________________________  WHEN TO CALL YOUR DOCTOR: 1. Fever over 101.0 2. Inability to urinate 3. Nausea and/or vomiting 4. Extreme swelling or bruising 5. Continued bleeding from incision. 6. Increased pain, redness, or drainage from the incision. 7. Difficulty swallowing or breathing 8. Muscle cramping or spasms. 9. Numbness or tingling in hands or feet or around lips.  The clinic staff  is available to answer your questions during regular business hours.  Please dont hesitate to call and ask to speak to one of the nurses if you have concerns.  For further questions, please visit [email or web address]   When taking Norco, be aware that there is  tylenol in this medication. DO NOT exceed 3000mg  of tylenol per day. DO NOT take any NSAIDS (ibuprofen, advil, aleve, etc. )

## 2018-03-11 NOTE — Progress Notes (Signed)
    3 Days Post-Op  Subjective: CC: Abdominal Pain Complains of some tightness of her abdomen but overall feels well. Tolerating FLD. Some nausea after pain medication yesterday but otherwise, no N/V. Passing flatus. +BM's x 2. Mobilizing.   Objective: Vital signs in last 24 hours: Temp:  [98.2 F (36.8 C)-98.4 F (36.9 C)] 98.4 F (36.9 C) (02/17 0543) Pulse Rate:  [65-82] 65 (02/17 0543) Resp:  [15-18] 18 (02/17 0543) BP: (115-145)/(72-89) 115/72 (02/17 0543) SpO2:  [93 %-100 %] 99 % (02/17 0543) Last BM Date: 03/09/18  Intake/Output from previous day: 02/16 0701 - 02/17 0700 In: 960 [P.O.:960] Out: 70 [Drains:70] Intake/Output this shift: No intake/output data recorded.  PE: Gen: WD, WN Heart: Regular  Lungs: CTA b/l Abd: Soft, ND, +BS, appropriately tender. Honeycomb dressing in place. JP drain with ~15cc serosanguinous fluid.  MSK: No edema. DP 2+ b/l.   Lab Results:  Recent Labs    03/09/18 0400 03/10/18 0338  WBC 9.0 7.4  HGB 9.6* 10.4*  HCT 30.8* 33.2*  PLT 208 251   BMET Recent Labs    03/09/18 0400 03/10/18 0338  NA 137 138  K 4.7 3.5  CL 102 105  CO2 27 28  GLUCOSE 127* 118*  BUN 8 7  CREATININE 0.67 0.63  CALCIUM 8.6* 8.3*   PT/INR No results for input(s): LABPROT, INR in the last 72 hours. CMP     Component Value Date/Time   NA 138 03/10/2018 0338   K 3.5 03/10/2018 0338   CL 105 03/10/2018 0338   CO2 28 03/10/2018 0338   GLUCOSE 118 (H) 03/10/2018 0338   GLUCOSE 84 01/08/2006 1511   BUN 7 03/10/2018 0338   CREATININE 0.63 03/10/2018 0338   CALCIUM 8.3 (L) 03/10/2018 0338   PROT 6.8 05/03/2017 0919   ALBUMIN 3.8 05/03/2017 0919   AST 15 05/03/2017 0919   ALT 10 05/03/2017 0919   ALKPHOS 42 05/03/2017 0919   BILITOT 0.6 05/03/2017 0919   GFRNONAA >60 03/10/2018 0338   GFRAA >60 03/10/2018 0338   Lipase     Component Value Date/Time   LIPASE 39.0 02/29/2012 1639       Studies/Results: No results  found.  Anti-infectives: Anti-infectives (From admission, onward)   Start     Dose/Rate Route Frequency Ordered Stop   03/08/18 0600  ciprofloxacin (CIPRO) IVPB 400 mg     400 mg 200 mL/hr over 60 Minutes Intravenous Every 12 hours 03/08/18 0339 03/09/18 0645       Assessment/Plan S/pRoux-en-Y gastric bypass by Dr. Hassell Done in 2007 Anxiety/depression Neck pain Alcohol use - CIWA  Perforated marginal ulcer S/pEXPLORATORY LAPAROTOMY, REPAIR AND PATCH CLOSURE OF PERFORATED ULCER2/13-14 Dr. Grandville Silos - POD 3 - Advance diet, Soft - PPI and Carafate PO suspensions - Remove honeycomb on 2/18 or 2/19 - Mobilize and IS mobilize  - No PT follow up  ID -cipro 2/14 - 2/15 >>  FEN -IVF, Soft VTE -SCDs, lovenox Foley -d/c 2/15 Follow up -Dr. Grandville Silos   LOS: 3 days    Jillyn Ledger , [facility] 03/11/2018, 10:22 AM Pager: [phone]

## 2018-03-11 NOTE — Progress Notes (Signed)
Physical Therapy Treatment and Discharge Patient Details Name: [name] MRN: [id number] DOB: February 19, 1965 Today's Date: 03/11/2018    History of Present Illness Pt is a 53 y.o. F s/p exploratory lapartomy, repair and patch closure of perforated ulcer 2/13    PT Comments    Patient seen for mobility progression. Pt overall is mod I/I for mobility including gait and stair training. Pt reports ambulating in hallway independently 4 times yesterday. Current plan remains appropriate and given pt's progress PT will sign off at this time. Please re order if needed.    Follow Up Recommendations  No PT follow up     Equipment Recommendations  None recommended by PT    Recommendations for Other Services       Precautions / Restrictions Precautions Precautions: Other (comment) Precaution Comments: drain Restrictions Weight Bearing Restrictions: No    Mobility  Bed Mobility Overal bed mobility: Independent                Transfers Overall transfer level: Independent                  Ambulation/Gait Ambulation/Gait assistance: Independent Ambulation distance: 500 ft Assistive device: None Gait Pattern/deviations: WFL(Within Functional Limits)     General Gait Details: decreased cadence initially but improved with distance; no imbalance noted    Stairs Stairs: Yes Stairs assistance: Modified independent (Device/Increase time) Stair Management: One rail Right;Step to pattern;Forwards Number of Stairs: 10     Wheelchair Mobility    Modified Rankin (Stroke Patients Only)       Balance Overall balance assessment: No apparent balance deficits (not formally assessed)                                          Cognition Arousal/Alertness: Awake/alert Behavior During Therapy: WFL for tasks assessed/performed Overall Cognitive Status: Within Functional Limits for tasks assessed                                         Exercises      General Comments        Pertinent Vitals/Pain Pain Assessment: Faces Faces Pain Scale: Hurts a little bit Pain Location: abdomen Pain Descriptors / Indicators: Discomfort;Guarding Pain Intervention(s): Monitored during session;Repositioned    Home Living                      Prior Function            PT Goals (current goals can now be found in the care plan section) Acute Rehab PT Goals Patient Stated Goal: "get back to work." Progress towards PT goals: Goals met/education completed, patient discharged from PT    Frequency    Min 3X/week      PT Plan Current plan remains appropriate    Co-evaluation              AM-PAC PT "6 Clicks" Mobility   Outcome Measure  Help needed turning from your back to your side while in a flat bed without using bedrails?: None Help needed moving from lying on your back to sitting on the side of a flat bed without using bedrails?: None Help needed moving to and from a bed to a chair (including a wheelchair)?: None Help needed standing up from  a chair using your arms (e.g., wheelchair or bedside chair)?: None Help needed to walk in hospital room?: None Help needed climbing 3-5 steps with a railing? : None 6 Click Score: 24    End of Session   Activity Tolerance: Patient tolerated treatment well Patient left: with call bell/phone within reach;in bed Nurse Communication: Mobility status PT Visit Diagnosis: Difficulty in walking, not elsewhere classified (R26.2);Pain Pain - part of body: (abdomen)     Time: 0943-1000 PT Time Calculation (min) (ACUTE ONLY): 17 min  Charges:  $Gait Training: 8-22 mins                     Earney Navy, PTA Acute Rehabilitation Services Pager: [phone] Office: [phone]     Darliss Cheney 03/11/2018, 11:28 AM

## 2018-03-12 MED ORDER — OMEPRAZOLE 40 MG PO CPDR: DELAYED_RELEASE_CAPSULE | ORAL | 0 refills | Status: DC

## 2018-03-12 MED ORDER — HYDROCODONE-ACETAMINOPHEN 7.5-325 MG PO TABS: 1.0000 | ORAL_TABLET | Freq: Four times a day (QID) | ORAL | 0 refills | Status: AC | PRN

## 2018-03-12 MED ORDER — SUCRALFATE 1 G PO TABS: ORAL_TABLET | ORAL | 0 refills | Status: DC

## 2018-03-12 MED ORDER — SUCRALFATE 1 GM/10ML PO SUSP: 1.0000 g | Freq: Three times a day (TID) | ORAL | 3 refills | Status: DC

## 2018-03-12 MED ORDER — OXYCODONE HCL 5 MG PO TABS: 5.0000 mg | ORAL_TABLET | ORAL | Status: DC | PRN

## 2018-03-12 MED ORDER — HYDROCODONE-ACETAMINOPHEN 7.5-325 MG PO TABS: 1.0000 | ORAL_TABLET | Freq: Four times a day (QID) | ORAL | Status: DC | PRN

## 2018-03-12 MED ORDER — PANTOPRAZOLE SODIUM 40 MG PO PACK: 40.0000 mg | PACK | Freq: Two times a day (BID) | ORAL | 3 refills | Status: DC

## 2018-03-12 MED FILL — HYDROCODON-APAP 7.5-325: 7.5-325 | 7 days supply | Qty: 25 | Fill #0

## 2018-03-12 MED FILL — SUCRALFATE 1 GM TABLET: 1 | 15 days supply | Qty: 60 | Fill #0

## 2018-03-12 MED FILL — OMEPRAZOLE 40 MG CPDR: 40 | 15 days supply | Qty: 30 | Fill #0

## 2018-03-12 NOTE — Discharge Summary (Signed)
[facility] Discharge Summary   Patient ID: [name] MRN: [id number] DOB/AGE: 02/01/1965 53 y.o.  Admit date: 03/07/2018 Discharge date: 03/12/2018  Admitting Diagnosis: Free intraperitoneal air due to perforated ulcer   Discharge Diagnosis Patient Active Problem List   Diagnosis Date Noted  . Free intraperitoneal air 03/08/2018  . Perforated ulcer ([place]) 03/08/2018  . Cervical radiculopathy at C7 02/22/2018  . Essential hypertension 08/02/2017  . Hematoma of hip, left, initial encounter 06/28/2017  . Neck pain 06/28/2017  . Nonallopathic lesion of cervical region 06/28/2017  . Nonallopathic lesion of thoracic region 06/28/2017  . Nonallopathic lesion of lumbar region 06/28/2017  . Muscle spasm 04/20/2017  . URI (upper respiratory infection) 10/02/2016  . Preventative health care 05/01/2016  . Piriformis syndrome, right 11/15/2015  . Degenerative arthritis of knee, bilateral 06/02/2015  . Patellofemoral arthritis of left knee 05/04/2015  . Cough 09/02/2013  . Acute pharyngitis 09/02/2013  . Sinusitis 06/25/2013  . Dog bite of face 04/16/2013  . Gastroenteritis 04/16/2013  . Allergic rhinitis 04/16/2013  . Mass of soft tissue 08/02/2012  . RESTLESS LEGS SYNDROME 01/21/2009  . LYMPHOPENIA 03/23/2008  . Depression 02/27/2008  . HEMANGIOMA, SKIN 02/04/2008  . 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  Procedures Dr. Dr. Grandville Silos (03/08/18) - Exploratory laparotomy, repair and patch closure of perforated ulcer  HPI: [name] is an 53 y.o. female.   Chief Complaint: abdominal pain HPI: Patient transferred from [facility] with free air.  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  She developed acute onset of severe abdominal pain around 5 PM today.  She was evaluated at [facility] and CT scan of the abdomen and pelvis showed free air likely due to a  perforated ulcer.  She was transferred for surgical care.  Hospital Course:  Patient was admitted and underwent procedure listed above.  Tolerated procedure well and was transferred to the floor. NGT was removed POD#! And pt was started on clears. Diet was advanced as tolerated. JP drain output decreased and was removed on day of discharge. On POD#4, the patient was voiding well, tolerating diet, ambulating well, pain well controlled, vital signs stable, incisions c/d/i and felt stable for discharge home.  Patient will follow up as outlined below and knows to call with questions or concerns.     Patient was discharged in good condition.  The [place] Substance controlled database was reviewed prior to prescribing narcotic pain medication to this patient.  Physical Exam: General:  Alert, NAD, pleasant, cooperative Cardio: RRR, S1 & S2 normal, no murmur, rubs, gallops Resp: Effort normal, lungs CTA bilaterally, no wheezes, rales, rhonchi Abd:  Soft, ND, normal bowel sounds, no tenderness, midline with staples intact is well appearing without signs of infection. Drain with scant serous drainage. No peritonitis.   Skin: warm and dry, no rashes noted  Allergies as of 03/12/2018      Reactions   Penicillins Other (See Comments)   Did it involve swelling of the face/tongue/throat, SOB, or low BP? Unk Did it involve sudden or severe rash/hives, skin peeling, or any reaction on the inside of your mouth or nose? Unk Did you need to seek medical attention at a hospital or doctor's office? Unk When did it last happen? Was 53 years old; reaction not recalled If all above answers are "NO", may proceed with cephalosporin use.   Codeine Itching  Medication List    STOP taking these medications   Diclofenac Sodium 2 % Soln Commonly known as:  PENNSAID   predniSONE 50 MG tablet Commonly known as:  DELTASONE     TAKE these medications   ALPRAZolam 1 MG tablet Commonly known as:   XANAX Take 0.25-1 mg by mouth as needed (before procedures or anxiety).   ANTI-DIARRHEAL 2 MG capsule Generic drug:  loperamide Take 2 mg by mouth 3 (three) times daily.   Cyanocobalamin 1000 MCG/ML Kit Inject 1,000 mcg as directed every 30 (thirty) days.   cyclobenzaprine 10 MG tablet Commonly known as:  FLEXERIL Take 1 tablet (10 mg total) by mouth 3 (three) times daily as needed. What changed:  reasons to take this   dextroamphetamine 10 MG tablet Commonly known as:  DEXTROSTAT Take 10 mg by mouth 3 (three) times daily.   fluticasone 50 MCG/ACT nasal spray Commonly known as:  FLONASE Place 2 sprays into both nostrils daily. What changed:    when to take this  reasons to take this   gabapentin 100 MG capsule Commonly known as:  NEURONTIN TAKE 2 CAPSULES BY MOUTH AT BEDTIME. What changed:    when to take this  reasons to take this   HYDROcodone-acetaminophen 7.5-325 MG tablet Commonly known as:  NORCO Take 1 tablet by mouth every 6 (six) hours as needed for up to 7 days for moderate pain.   omeprazole 40 MG capsule Commonly known as:  PRILOSEC Open and mix contents of capsule with 1 tablespoon of apple sauce twice daily. Be sure to drink one glass of water following.   rOPINIRole 1 MG tablet Commonly known as:  REQUIP TAKE 1 TABLET (1 MG TOTAL) BY MOUTH AT BEDTIME. What changed:    how much to take  how to take this  when to take this  additional instructions   sucralfate 1 g tablet Commonly known as:  CARAFATE Crush and mix 1 tablet with a glass of water four times daily   SYRINGE-NEEDLE (DISP) 3 ML 25G X 5/8" 3 ML Misc Commonly known as:  B-D 3CC LUER-LOK SYR 25GX5/8" USE AS DIRECTED WITH B-12 INJECTION   trimethoprim 100 MG tablet Commonly known as:  TRIMPEX Take 1 tablet (100 mg total) by mouth daily. What changed:    when to take this  reasons to take this   VIIBRYD 40 MG Tabs Generic drug:  Vilazodone HCl Take 40 mg by mouth daily.    Vitamin D (Ergocalciferol) 1.25 MG (50000 UT) Caps capsule Commonly known as:  DRISDOL Take 1 capsule (50,000 Units total) by mouth every 7 (seven) days.   Vitamin D3 50 MCG (2000 UT) capsule Take 1 capsule (2,000 Units total) by mouth daily.        Follow-up [facility], [place]. Go on 03/18/2018.   Specialty:  General Surgery Why:  Your appointment is 2/24 at 10 am for staple removal, please call to confirm. Please arrive 30 minutes prior to your appointment to check in and fill out paperwork. Bring photo ID and insurance information. Contact information: [address] [place] [phone]       Georganna Skeans, MD. Daphane Shepherd on 03/25/2018.   Specialty:  General Surgery Why:  Your appointment is 03/02 at 11:10 am  Please arrive 15 minutes prior to your appointment to check in. Contact information: [address] [place] [address] [phone]  Signed: [facility] 03/12/2018, 9:26 AM Pager: [phone] Consults: [phone] Mon-Fri 7:00 am-4:30 pm Sat-Sun 7:00 am-11:30 am

## 2018-03-12 NOTE — Progress Notes (Signed)
[name] to be D/C'd  per MD order. Discussed with the patient and all questions fully answered.  VSS, Skin clean, dry and intact without evidence of skin break down, no evidence of skin tears noted.  IV catheter discontinued intact. Site without signs and symptoms of complications. Dressing and pressure applied.  An After Visit Summary was printed and given to the patient. Patient received prescription.  D/c education completed with patient/family including follow up instructions, medication list, d/c activities limitations if indicated, with other d/c instructions as indicated by MD - patient able to verbalize understanding, all questions fully answered.   Patient instructed to return to ED, call 911, or call MD for any changes in condition.   Patient to be escorted via [place], and D/C home via private auto.

## 2018-03-13 LAB — AEROBIC/ANAEROBIC CULTURE (SURGICAL/DEEP WOUND)

## 2018-03-13 LAB — AEROBIC/ANAEROBIC CULTURE W GRAM STAIN (SURGICAL/DEEP WOUND)

## 2018-03-21 DIAGNOSIS — F900 Attention-deficit hyperactivity disorder, predominantly inattentive type: Secondary | ICD-10-CM | POA: Diagnosis not present

## 2018-03-21 DIAGNOSIS — F3342 Major depressive disorder, recurrent, in full remission: Secondary | ICD-10-CM | POA: Diagnosis not present

## 2018-03-21 DIAGNOSIS — F410 Panic disorder [episodic paroxysmal anxiety] without agoraphobia: Secondary | ICD-10-CM | POA: Diagnosis not present

## 2018-03-29 ENCOUNTER — Telehealth: Payer: Self-pay | Admitting: *Deleted

## 2018-03-29 NOTE — Telephone Encounter (Signed)
Copied from [place] [id number]. Topic: General - Other >> Mar 28, 2018  3:42 PM Yvette Rack wrote: Reason for CRM: [place] with [employer] called in to advise that pt is enrolled in their case management program with nurse Tamsen Snider [phone] Ext [phone]

## 2018-04-01 ENCOUNTER — Telehealth: Payer: Self-pay | Admitting: Family Medicine

## 2018-04-01 NOTE — Telephone Encounter (Signed)
Spoke with patient. Scheduled her tomorrow with Dr. Tamala Julian.

## 2018-04-01 NOTE — Telephone Encounter (Signed)
Copied from [place] [id number]. Topic: Quick Communication - See Telephone Encounter >> Apr 01, 2018  2:17 PM Rayann Heman wrote: CRM for notification. See Telephone encounter for: 04/01/18. Pt called and stated that she is still having neck pain and shot did not work. Pt would like to know other options available. Please advise

## 2018-04-02 ENCOUNTER — Ambulatory Visit: Payer: BLUE CROSS/BLUE SHIELD | Admitting: Family Medicine

## 2018-04-02 NOTE — Telephone Encounter (Signed)
Left message on machine to call us if any questions.  Was not sure why they called her.

## 2018-04-09 ENCOUNTER — Other Ambulatory Visit: Payer: Self-pay

## 2018-04-09 ENCOUNTER — Ambulatory Visit
Admission: RE | Admit: 2018-04-09 | Discharge: 2018-04-09 | Disposition: A | Discharge status: Home / Self Care | Payer: BLUE CROSS/BLUE SHIELD | Source: Ambulatory Visit | Attending: Gynecology | Admitting: Gynecology

## 2018-04-09 DIAGNOSIS — Z1231 Encounter for screening mammogram for malignant neoplasm of breast: Secondary | ICD-10-CM

## 2018-04-10 NOTE — Progress Notes (Signed)
[name] [address] [place], [place] [address] Phone: [phone] Subjective:    CC: Neck pain follow-up  FOY:DXAJOINOMV  [name] is a 53 y.o. female coming in with complaint of cervical radiculopathy. Had epidural on 03/05/2018. Had little relief from epidural. Is in constant pain. Decreased ROM and crepitus with movement. Patient is taking gabapentin and Tylenol. Did not use prednisone. Has had emergency surgery for a perferated ulcer.      Past Medical History:  Diagnosis Date  . Allergy   . Anemia   . Anxiety   . Asthma   . Depression   . GERD (gastroesophageal reflux disease)   . Heart murmur    Past Surgical History:  Procedure Laterality Date  . ABDOMINAL HYSTERECTOMY     fibroids  . GASTRIC BYPASS    . LAPAROTOMY N/A 03/08/2018   Procedure: EXPLORATORY LAPAROTOMY, REPAIR AND PATCH CLOSURE OF PERFORATED ULCER;  Surgeon: Georganna Skeans, MD;  Location: [facility];  Service: General;  Laterality: N/A;  . TUBAL LIGATION     Social History   Socioeconomic History  . Marital status: Married    Spouse name: Not on file  . Number of children: Not on file  . Years of education: Not on file  . Highest education level: Not on file  Occupational History    Employer: [employer]    Comment: [employer]  Social Needs  . Financial resource strain: Not on file  . Food insecurity:    Worry: Not on file    Inability: Not on file  . Transportation needs:    Medical: Not on file    Non-medical: Not on file  Tobacco Use  . Smoking status: Former Smoker    Last attempt to quit: 01/24/1987    Years since quitting: 31.2  . Smokeless tobacco: Never Used  Substance and Sexual Activity  . Alcohol use: Yes    Alcohol/week: 35.0 standard drinks    Types: 35 Cans of beer per week    Comment: Pt states she drinks 5 beers mosts days  . Drug use: No  . Sexual activity: Not Currently  Lifestyle  . Physical activity:    Days per week: Not on file     Minutes per session: Not on file  . Stress: Not on file  Relationships  . Social connections:    Talks on phone: Not on file    Gets together: Not on file    Attends religious service: Not on file    Active member of club or organization: Not on file    Attends meetings of clubs or organizations: Not on file    Relationship status: Not on file  Other Topics Concern  . Not on file  Social History Narrative   Regular exercise--no (because of knees)   Allergies  Allergen Reactions  . Penicillins Other (See Comments)    Did it involve swelling of the face/tongue/throat, SOB, or low BP? Unk Did it involve sudden or severe rash/hives, skin peeling, or any reaction on the inside of your mouth or nose? Unk Did you need to seek medical attention at a hospital or doctor's office? Unk When did it last happen? Was 53 years old; reaction not recalled If all above answers are "NO", may proceed with cephalosporin use.   . Codeine Itching   Family History  Problem Relation Age of Onset  . Hypertension Father   . Cancer Father  Prostate  . Depression Sister   . Depression Brother   . Diabetes Maternal Grandmother   . Diabetes Maternal Grandfather   . Liver disease Maternal Grandfather   . Cancer Maternal Grandfather        Prostate  . Diabetes Paternal Grandmother   . Diabetes Paternal Grandfather   . Cancer Paternal Grandfather        Lung, Prostate  . Stroke Mother       Current Outpatient Medications (Respiratory):  .  fluticasone (FLONASE) 50 MCG/ACT nasal spray, Place 2 sprays into both nostrils daily. (Patient taking differently: Place 2 sprays into both nostrils daily as needed for allergies or rhinitis. )   Current Outpatient Medications (Hematological):  Marland Kitchen  Cyanocobalamin 1000 MCG/ML KIT, Inject 1,000 mcg as directed every 30 (thirty) days.  Current Outpatient Medications (Other):  Marland Kitchen  ALPRAZolam (XANAX) 1 MG tablet, Take 0.25-1 mg by mouth as needed (before  procedures or anxiety).  .  Cholecalciferol (VITAMIN D3) 2000 units capsule, Take 1 capsule (2,000 Units total) by mouth daily. .  cyclobenzaprine (FLEXERIL) 10 MG tablet, Take 1 tablet (10 mg total) by mouth 3 (three) times daily as needed. (Patient taking differently: Take 10 mg by mouth 3 (three) times daily as needed for muscle spasms. ) .  dextroamphetamine (DEXTROSTAT) 10 MG tablet, Take 10 mg by mouth 3 (three) times daily. Marland Kitchen  loperamide (ANTI-DIARRHEAL) 2 MG capsule, Take 2 mg by mouth 3 (three) times daily. Marland Kitchen  omeprazole (PRILOSEC) 40 MG capsule, Open and mix contents of capsule with 1 tablespoon of apple sauce twice daily. Be sure to drink one glass of water following. Marland Kitchen  rOPINIRole (REQUIP) 1 MG tablet, TAKE 1 TABLET (1 MG TOTAL) BY MOUTH AT BEDTIME. (Patient taking differently: Take 1 mg by mouth at bedtime. ) .  sucralfate (CARAFATE) 1 g tablet, Crush and mix 1 tablet with a glass of water four times daily .  SYRINGE-NEEDLE, DISP, 3 ML (B-D 3CC LUER-LOK SYR 25GX5/8") 25G X 5/8" 3 ML MISC, USE AS DIRECTED WITH B-12 INJECTION .  trimethoprim (TRIMPEX) 100 MG tablet, Take 1 tablet (100 mg total) by mouth daily. (Patient taking differently: Take 100 mg by mouth daily as needed ("as directed, for bladder infections"). ) .  Vilazodone HCl (VIIBRYD) 40 MG TABS, Take 40 mg by mouth daily. .  Vitamin D, Ergocalciferol, (DRISDOL) 50000 units CAPS capsule, Take 1 capsule (50,000 Units total) by mouth every 7 (seven) days. Marland Kitchen  gabapentin (NEURONTIN) 100 MG capsule, Take 2 capsules (200 mg total) by mouth 2 (two) times daily. Marland Kitchen  gabapentin (NEURONTIN) 300 MG capsule, nightly    Past medical history, social, surgical and family history all reviewed in electronic medical record.  No pertanent information unless stated regarding to the chief complaint.   Review of Systems:  No  visual changes, nausea, vomiting, diarrhea, constipation, dizziness, abdominal pain, skin rash, fevers, chills, night  sweats, weight loss, swollen lymph nodes,  chest pain, shortness of breath, mood changes.  Positive aches, headaches  Objective  Blood pressure 100/72, pulse 71, height '5\' 2"'  (1.575 m), weight 123 lb (55.8 kg), SpO2 99 %.    General: No apparent distress alert and oriented x3 mood and affect normal, dressed appropriately.  HEENT: Pupils equal, extraocular movements intact  Respiratory: Patient's speak in full sentences and does not appear short of breath  Cardiovascular: No lower extremity edema, non tender, no erythema  Skin: Warm dry intact with no signs of infection or rash on  extremities or on axial skeleton.  Abdomen: Soft not distended Neuro: Cranial nerves II through XII are intact, neurovascularly intact in all extremities with 2+ DTRs and 2+ pulses.  Lymph: No lymphadenopathy of posterior or anterior cervical chain or axillae bilaterally.  Gait normal with good balance and coordination.  MSK:  Non tender with full range of motion and good stability and symmetric strength and tone of shoulders, elbows, wrist, hip, knee and ankles bilaterally.  Neck pain shows the patient does have some decreasing range of motion.  Tighter than usual.  Mild radicular symptoms with Spurling's bilaterally.  Pain more at the occiput area as well as the paraspinal musculature of the cervical spine.     Impression and Recommendations:     This case required medical decision making of moderate complexity. The above documentation has been reviewed and is accurate and complete Lyndal Pulley, DO       Note: This dictation was prepared with Dragon dictation along with smaller phrase technology. Any transcriptional errors that result from this process are unintentional.

## 2018-04-11 ENCOUNTER — Ambulatory Visit (INDEPENDENT_AMBULATORY_CARE_PROVIDER_SITE_OTHER): Payer: BLUE CROSS/BLUE SHIELD | Admitting: Family Medicine

## 2018-04-11 ENCOUNTER — Other Ambulatory Visit: Payer: Self-pay

## 2018-04-11 VITALS — BP 100/72 | HR 71 | Ht 62.0 in | Wt 123.0 lb

## 2018-04-11 DIAGNOSIS — G8929 Other chronic pain: Secondary | ICD-10-CM | POA: Diagnosis not present

## 2018-04-11 DIAGNOSIS — M542 Cervicalgia: Secondary | ICD-10-CM

## 2018-04-11 MED ORDER — GABAPENTIN 300 MG PO CAPS: ORAL_CAPSULE | ORAL | 3 refills | Status: DC

## 2018-04-11 MED ORDER — GABAPENTIN 100 MG PO CAPS: 200.0000 mg | ORAL_CAPSULE | Freq: Two times a day (BID) | ORAL | 3 refills | Status: DC

## 2018-04-11 NOTE — Patient Instructions (Signed)
Good to see you  Ice 20 minutes 2 times daily. Usually after activity and before bed. Gabapentin 300mg  at night 100mg  ain AM and 100mg  in PM  Stay active Dr. Saintclair Halsted office will call you  Tart cherry extract any dose at night Read about hyloronic acid.  Send a message in 3 weeks

## 2018-04-11 NOTE — Assessment & Plan Note (Signed)
Neck pain.  Patient has had this for quite some time.  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  Did not respond well to the epidural at all.  Pain seems to be worsening.  Patient sometimes feels like difficulty with range of motion is lifting her head.  Chronic headaches.  Patient would like referral to neurosurgery to discuss other options.  Increase gabapentin to 100 mg in a.m. and p.m. and 300 mg at night.  Patient will call me if she has any other questions.  All questions answered today.  Spent  25 minutes with patient face-to-face and had greater than 50% of counseling including as described above in assessment and plan.

## 2018-04-20 ENCOUNTER — Other Ambulatory Visit: Payer: Self-pay | Admitting: Family Medicine

## 2018-05-05 ENCOUNTER — Other Ambulatory Visit: Payer: Self-pay | Admitting: Family Medicine

## 2018-05-06 ENCOUNTER — Telehealth: Payer: Self-pay | Admitting: Family Medicine

## 2018-05-06 ENCOUNTER — Encounter: Payer: BLUE CROSS/BLUE SHIELD | Admitting: Family Medicine

## 2018-05-06 NOTE — Telephone Encounter (Signed)
Copied from [place] [id number]. Topic: General - Other >> May 06, 2018  1:15 PM Gustavus Messing wrote: Reason for CRM: Patient and husband Makaria Poarch 08/13[birth year]63) need FMLA paperwork filled out because it will run out before appointment. Has new information about Perforated Ulcer

## 2018-05-08 NOTE — Telephone Encounter (Signed)
Patients cpe was cancelled and that's when she usually get her fmla paperwork done.  Advised patient to either bring it in or fax and we will determine if she will need a virtual visit at that time.    She is sending it tomorrow.  I advised her that I will be out of the office along with Ivin Booty.  We will all be back on Friday along with Dr. Etter Sjogren.

## 2018-05-21 ENCOUNTER — Encounter: Payer: Self-pay | Admitting: Family Medicine

## 2018-05-21 DIAGNOSIS — M5481 Occipital neuralgia: Secondary | ICD-10-CM | POA: Diagnosis not present

## 2018-05-28 ENCOUNTER — Ambulatory Visit: Payer: Self-pay

## 2018-05-28 ENCOUNTER — Other Ambulatory Visit: Payer: Self-pay

## 2018-05-28 ENCOUNTER — Ambulatory Visit (INDEPENDENT_AMBULATORY_CARE_PROVIDER_SITE_OTHER): Payer: BLUE CROSS/BLUE SHIELD | Admitting: Family Medicine

## 2018-05-28 ENCOUNTER — Encounter: Payer: Self-pay | Admitting: Family Medicine

## 2018-05-28 VITALS — BP 126/77 | HR 80 | Temp 98.7°F | Ht 62.0 in | Wt 127.0 lb

## 2018-05-28 DIAGNOSIS — Z20828 Contact with and (suspected) exposure to other viral communicable diseases: Secondary | ICD-10-CM | POA: Diagnosis not present

## 2018-05-28 DIAGNOSIS — Z20822 Contact with and (suspected) exposure to covid-19: Secondary | ICD-10-CM

## 2018-05-28 DIAGNOSIS — K529 Noninfective gastroenteritis and colitis, unspecified: Secondary | ICD-10-CM | POA: Diagnosis not present

## 2018-05-28 DIAGNOSIS — K219 Gastro-esophageal reflux disease without esophagitis: Secondary | ICD-10-CM | POA: Insufficient documentation

## 2018-05-28 MED ORDER — PANTOPRAZOLE SODIUM 40 MG PO TBEC: 40.0000 mg | DELAYED_RELEASE_TABLET | Freq: Every day | ORAL | 3 refills | Status: DC

## 2018-05-28 NOTE — Telephone Encounter (Signed)
Virtual visit scheduled.  

## 2018-05-28 NOTE — Telephone Encounter (Signed)
Pt called stating that she has had a posaitive exposure to COVID-19.  She states that several employees at [employer] where she works have tested positive.  She is unsure who they are or how much exposure she may have had.  Today she states that she has abdominal cramping, no appetite. She had chills on Sunday. She is unsure if she has fever today. She feels very nauseated. No vomiting or diarrhea. She also states that her husband had these symptoms 2 weeks ago. 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 Care advice read to patient. Pt verbalized understanding. Call transferred to  Office for scheduling  Reason for Disposition . [1] Mild body aches, chills, diarrhea, headache, runny nose, or sore throat AND [2] within 14 days of COVID-19 EXPOSURE  Answer Assessment - Initial Assessment Questions 1. CLOSE CONTACT: "Who is the person with the confirmed or suspected COVID-19 infection that you were exposed to?"     Confirmed positive co worker 2. PLACE of CONTACT: "Where were you when you were exposed to COVID-19?" (e.g., home, school, medical waiting room; which city?)    work 3. TYPE of CONTACT: "How much contact was there?" (e.g., sitting next to, live in same house, work in same office, same building)     Same store 4. DURATION of CONTACT: "How long were you in contact with the COVID-19 patient?" (e.g., a few seconds, passed by person, a few minutes, live with the patient)     Passed by 5. DATE of CONTACT: "When did you have contact with a COVID-19 patient?" (e.g., how many days ago)     2weeks husband was sick, 2 weeks ago co worker 2 positive  6. TRAVEL: "Have you traveled out of the country recently?" If so, "When and where?"     * Also ask about out-of-state travel, since the CDC has identified some high risk cities for community spread in the [place].     * Note: Travel becomes less relevant if there is widespread community transmission where the patient lives.     no 7. COMMUNITY SPREAD:  "Are there lots of cases of COVID-19 (community spread) where you live?" (See public health department website, if unsure)   * MAJOR community spread: high number of cases; numbers of cases are increasing; many people hospitalized.   * MINOR community spread: low number of cases; not increasing; few or no people hospitalized     [place] 8. SYMPTOMS: "Do you have any symptoms?" (e.g., fever, cough, breathing difficulty)     Cramping abdominal pain, cold chills Sunday,nausea 9. PREGNANCY OR POSTPARTUM: "Is there any chance you are pregnant?" "When was your last menstrual period?" "Did you deliver in the last 2 weeks?"     N/A 10. HIGH RISK: "Do you have any heart or lung problems? Do you have a weak immune system?" (e.g., CHF, COPD, asthma, HIV positive, chemotherapy, renal failure, diabetes mellitus, sickle cell anemia)       Gastric bypass  Protocols used: CORONAVIRUS (COVID-19) EXPOSURE-A-AH

## 2018-05-28 NOTE — Assessment & Plan Note (Signed)
?   covid-- pt in quarantine If symptoms worsen-- go to ER Pt using otc with some relief

## 2018-05-28 NOTE — Progress Notes (Signed)
Virtual Visit via Video Note  I connected with [name] on 05/28/18 at 10:45 AM EDT by a video enabled telemedicine application and verified that I am speaking with the correct person using two identifiers.  Location: Patient: home Provider: office   I discussed the limitations of evaluation and management by telemedicine and the availability of in person appointments. The patient expressed understanding and agreed to proceed.  History of Present Illness:   14 days after her husband had gi symptoms etc --- she has it but she found out 3-6 coworkers test + for covid    She is having some lose stools and cramping   No fevers   Some congestion.  Her boss from both jobs will not let her come in-- they want her quarantined for 14 days   Past Medical History:  Diagnosis Date  . Allergy   . Anemia   . Anxiety   . Asthma   . Depression   . GERD (gastroesophageal reflux disease)   . Heart murmur    Current Outpatient Medications on File Prior to Visit  Medication Sig Dispense Refill  . ALPRAZolam (XANAX) 1 MG tablet Take 0.25-1 mg by mouth as needed (before procedures or anxiety).     . Cholecalciferol (VITAMIN D3) 2000 units capsule Take 1 capsule (2,000 Units total) by mouth daily. 30 capsule 1  . Cyanocobalamin 1000 MCG/ML KIT Inject 1,000 mcg as directed every 30 (thirty) days.    . cyclobenzaprine (FLEXERIL) 10 MG tablet Take 1 tablet (10 mg total) by mouth 3 (three) times daily as needed. (Patient taking differently: Take 10 mg by mouth 3 (three) times daily as needed for muscle spasms. ) 30 tablet 0  . dextroamphetamine (DEXTROSTAT) 10 MG tablet Take 10 mg by mouth 3 (three) times daily.    . fluticasone (FLONASE) 50 MCG/ACT nasal spray Place 2 sprays into both nostrils daily. (Patient taking differently: Place 2 sprays into both nostrils daily as needed for allergies or rhinitis. ) 16 g 5  . gabapentin (NEURONTIN) 100 MG capsule TAKE 2 CAPSULES (200 MG TOTAL) BY MOUTH 2 (TWO)  TIMES DAILY. 360 capsule 1  . gabapentin (NEURONTIN) 300 MG capsule TAKE NIGHTLY AS DIRECTED 90 capsule 2  . loperamide (ANTI-DIARRHEAL) 2 MG capsule Take 2 mg by mouth 3 (three) times daily.    Marland Kitchen rOPINIRole (REQUIP) 1 MG tablet TAKE 1 TABLET (1 MG TOTAL) BY MOUTH AT BEDTIME. (Patient taking differently: Take 1 mg by mouth at bedtime. ) 90 tablet 3  . sucralfate (CARAFATE) 1 g tablet Crush and mix 1 tablet with a glass of water four times daily 60 tablet 0  . SYRINGE-NEEDLE, DISP, 3 ML (B-D 3CC LUER-LOK SYR 25GX5/8") 25G X 5/8" 3 ML MISC USE AS DIRECTED WITH B-12 INJECTION 16 each 3  . trimethoprim (TRIMPEX) 100 MG tablet Take 1 tablet (100 mg total) by mouth daily. (Patient taking differently: Take 100 mg by mouth daily as needed ("as directed, for bladder infections"). ) 90 tablet 1  . Vilazodone HCl (VIIBRYD) 40 MG TABS Take 40 mg by mouth daily.    . Vitamin D, Ergocalciferol, (DRISDOL) 50000 units CAPS capsule Take 1 capsule (50,000 Units total) by mouth every 7 (seven) days. 4 capsule 2  . [DISCONTINUED] Azelastine-Fluticasone (DYMISTA) 137-50 MCG/ACT SUSP Place 1 spray into the nose 2 (two) times daily.    . [DISCONTINUED] loratadine (CLARITIN) 10 MG tablet Take 10 mg by mouth daily.      . [DISCONTINUED] montelukast (SINGULAIR)  10 MG tablet Take 10 mg by mouth at bedtime.      . [DISCONTINUED] RABEprazole (ACIPHEX) 20 MG tablet Take 20 mg by mouth daily.      . [DISCONTINUED] traZODone (DESYREL) 100 MG tablet Take 100 mg by mouth at bedtime.       No current facility-administered medications on file prior to visit.     Observations/Objective:  Vitals:   05/28/18 1047  BP: 126/77  Pulse: 80  Temp: 98.7 F (37.1 C)   Pt in NAD  Assessment an d Plan: 1. Exposure to Covid-19 Virus Quarantine for 14 days If any sob or high fevers--- go to ER  Pt aware   2. Gastroesophageal reflux disease, esophagitis presence not specified stable - pantoprazole (PROTONIX) 40 MG tablet; Take 1  tablet (40 mg total) by mouth daily.  Dispense: 90 tablet; Refill: 3  3. Gastroenteritis ? covid-- pt in quarantine If symptoms worsen-- go to ER Pt using otc with some relief    Follow Up Instructions:    I discussed the assessment and treatment plan with the patient. The patient was provided an opportunity to ask questions and all were answered. The patient agreed with the plan and demonstrated an understanding of the instructions.   The patient was advised to call back or seek an in-person evaluation if the symptoms worsen or if the condition fails to improve as anticipated.  I provided 25 minutes of non-face-to-face time during this encounter.   [name], DO

## 2018-05-28 NOTE — Assessment & Plan Note (Signed)
Quarantine for 14 days If any sob or high fevers--- go to ER  Pt aware

## 2018-05-28 NOTE — Assessment & Plan Note (Signed)
Stable refilll meds

## 2018-06-06 ENCOUNTER — Encounter: Payer: Self-pay | Admitting: Family Medicine

## 2018-06-06 ENCOUNTER — Telehealth: Payer: Self-pay

## 2018-06-06 NOTE — Telephone Encounter (Signed)
Copied from [place] [phone]. Topic: General - Inquiry >> Jun 06, 2018  1:33 PM Virl Axe D wrote: Reason for CRM: Pt stated she received her Covid-19 tests results and they were negative. She is able to go back to her part time job on 06/08/18 and her full time job on 5/18/2. She needs two revised work notes, one for each job. She is asking if they can be uploaded to her MyChart. Please advise

## 2018-06-07 NOTE — Telephone Encounter (Signed)
Patient calling to inquire status of work notes. She is requesting a call back.

## 2018-06-07 NOTE — Telephone Encounter (Signed)
Notes have been fixed and done thru mychart.

## 2018-06-11 ENCOUNTER — Other Ambulatory Visit: Payer: Self-pay | Admitting: Surgery

## 2018-06-11 DIAGNOSIS — K289 Gastrojejunal ulcer, unspecified as acute or chronic, without hemorrhage or perforation: Secondary | ICD-10-CM

## 2018-06-24 DIAGNOSIS — M47812 Spondylosis without myelopathy or radiculopathy, cervical region: Secondary | ICD-10-CM | POA: Diagnosis not present

## 2018-06-26 ENCOUNTER — Ambulatory Visit
Admission: RE | Admit: 2018-06-26 | Discharge: 2018-06-26 | Disposition: A | Discharge status: Home / Self Care | Payer: BLUE CROSS/BLUE SHIELD | Source: Ambulatory Visit | Attending: Surgery | Admitting: Surgery

## 2018-06-26 ENCOUNTER — Telehealth: Payer: Self-pay | Admitting: Family Medicine

## 2018-06-26 ENCOUNTER — Other Ambulatory Visit: Payer: Self-pay | Admitting: Surgery

## 2018-06-26 DIAGNOSIS — K289 Gastrojejunal ulcer, unspecified as acute or chronic, without hemorrhage or perforation: Secondary | ICD-10-CM

## 2018-06-26 DIAGNOSIS — K224 Dyskinesia of esophagus: Secondary | ICD-10-CM | POA: Diagnosis not present

## 2018-06-26 NOTE — Telephone Encounter (Signed)
Copied from [place] [phone]. Topic: Quick Communication - See Telephone Encounter >> Jun 26, 2018 12:32 PM Rutherford Nail, NT wrote: CRM for notification. See Telephone encounter for: 06/26/18. Patient calling and states that she was prescribed pantoprazole (PROTONIX) 40 MG tablet and states that it is a delayed release and she does not believe that it is working. States that she has had gastric bypass surgery and her body does not absorb delayed release medications. Would like a call back from the nurse to discuss. Please advise.  CB#: [phone]

## 2018-06-28 NOTE — Telephone Encounter (Signed)
Has she tried something in the past that did work ?     Can try omeprazole 20 mg bid  #60 2 refills

## 2018-07-01 ENCOUNTER — Other Ambulatory Visit: Payer: Self-pay | Admitting: Family Medicine

## 2018-07-01 ENCOUNTER — Other Ambulatory Visit: Payer: Self-pay

## 2018-07-01 MED ORDER — OMEPRAZOLE 20 MG PO CPDR: 20.0000 mg | DELAYED_RELEASE_CAPSULE | Freq: Two times a day (BID) | ORAL | 2 refills | Status: DC

## 2018-07-01 NOTE — Telephone Encounter (Signed)
Patient stated that the pharmacist a bit ago told her that she was on too much medication and the same medication.  The surgeon took her off of everything.  I got a couple different answers from her in about who told her not to take what.  The last answer was the pharmacist.   She has nausea, burping a lot, gas, and bloating.  Im not sure what else you would like to give her?.  Maybe GI?

## 2018-07-01 NOTE — Telephone Encounter (Signed)
She should only be taking th omeprazole fo stomach--- can refer to [facility]

## 2018-07-01 NOTE — Telephone Encounter (Signed)
Med sent to pharmacy.

## 2018-07-02 ENCOUNTER — Other Ambulatory Visit: Payer: Self-pay

## 2018-07-02 DIAGNOSIS — K219 Gastro-esophageal reflux disease without esophagitis: Secondary | ICD-10-CM

## 2018-07-02 NOTE — Telephone Encounter (Signed)
Referral placed- Mychart message sent to patient

## 2018-07-02 NOTE — Progress Notes (Signed)
ref

## 2018-07-03 DIAGNOSIS — M5481 Occipital neuralgia: Secondary | ICD-10-CM | POA: Diagnosis not present

## 2018-07-03 DIAGNOSIS — M542 Cervicalgia: Secondary | ICD-10-CM | POA: Diagnosis not present

## 2018-07-04 ENCOUNTER — Telehealth: Payer: Self-pay | Admitting: *Deleted

## 2018-07-04 NOTE — Telephone Encounter (Signed)
Copied from [place] [id number]. Topic: General - Other >> Jul 02, 2018  3:40 PM Keene Breath wrote: Reason for CRM: Patient called to request a call from the nurse regarding medications and FMLA papers. Please advise and call patient back at [phone]

## 2018-07-08 ENCOUNTER — Encounter: Payer: Self-pay | Admitting: *Deleted

## 2018-07-08 NOTE — Telephone Encounter (Signed)
Left message on machine and for patient to call back to see what paperwork was for.  Also sent mychart message

## 2018-07-09 DIAGNOSIS — Z0279 Encounter for issue of other medical certificate: Secondary | ICD-10-CM

## 2018-07-10 ENCOUNTER — Other Ambulatory Visit: Payer: Self-pay

## 2018-07-10 ENCOUNTER — Encounter: Payer: Self-pay | Admitting: Physician Assistant

## 2018-07-10 ENCOUNTER — Telehealth: Payer: Self-pay

## 2018-07-10 ENCOUNTER — Ambulatory Visit (INDEPENDENT_AMBULATORY_CARE_PROVIDER_SITE_OTHER): Payer: BC Managed Care – PPO | Admitting: Physician Assistant

## 2018-07-10 ENCOUNTER — Encounter (HOSPITAL_COMMUNITY): Payer: Self-pay | Admitting: Emergency Medicine

## 2018-07-10 ENCOUNTER — Emergency Department (HOSPITAL_COMMUNITY): Payer: BC Managed Care – PPO

## 2018-07-10 ENCOUNTER — Inpatient Hospital Stay (HOSPITAL_COMMUNITY)
Admission: EM | Admit: 2018-07-10 | Discharge: 2018-07-12 | DRG: 378 | Disposition: A | Discharge status: Home / Self Care | Payer: BC Managed Care – PPO | Attending: Internal Medicine | Admitting: Internal Medicine

## 2018-07-10 VITALS — BP 110/68 | HR 84 | Temp 98.6°F | Ht 62.0 in | Wt 124.0 lb

## 2018-07-10 DIAGNOSIS — Z79899 Other long term (current) drug therapy: Secondary | ICD-10-CM | POA: Diagnosis not present

## 2018-07-10 DIAGNOSIS — F419 Anxiety disorder, unspecified: Secondary | ICD-10-CM | POA: Diagnosis present

## 2018-07-10 DIAGNOSIS — Z20828 Contact with and (suspected) exposure to other viral communicable diseases: Secondary | ICD-10-CM | POA: Diagnosis not present

## 2018-07-10 DIAGNOSIS — D62 Acute posthemorrhagic anemia: Secondary | ICD-10-CM | POA: Diagnosis not present

## 2018-07-10 DIAGNOSIS — K449 Diaphragmatic hernia without obstruction or gangrene: Secondary | ICD-10-CM | POA: Diagnosis not present

## 2018-07-10 DIAGNOSIS — Z87891 Personal history of nicotine dependence: Secondary | ICD-10-CM

## 2018-07-10 DIAGNOSIS — Z885 Allergy status to narcotic agent status: Secondary | ICD-10-CM

## 2018-07-10 DIAGNOSIS — Z7951 Long term (current) use of inhaled steroids: Secondary | ICD-10-CM | POA: Diagnosis not present

## 2018-07-10 DIAGNOSIS — Z114 Encounter for screening for human immunodeficiency virus [HIV]: Secondary | ICD-10-CM

## 2018-07-10 DIAGNOSIS — Z880 Allergy status to penicillin: Secondary | ICD-10-CM

## 2018-07-10 DIAGNOSIS — J45909 Unspecified asthma, uncomplicated: Secondary | ICD-10-CM

## 2018-07-10 DIAGNOSIS — R110 Nausea: Secondary | ICD-10-CM | POA: Diagnosis not present

## 2018-07-10 DIAGNOSIS — G2581 Restless legs syndrome: Secondary | ICD-10-CM | POA: Diagnosis not present

## 2018-07-10 DIAGNOSIS — F329 Major depressive disorder, single episode, unspecified: Secondary | ICD-10-CM | POA: Diagnosis present

## 2018-07-10 DIAGNOSIS — Z818 Family history of other mental and behavioral disorders: Secondary | ICD-10-CM

## 2018-07-10 DIAGNOSIS — Z1159 Encounter for screening for other viral diseases: Secondary | ICD-10-CM | POA: Diagnosis not present

## 2018-07-10 DIAGNOSIS — R195 Other fecal abnormalities: Secondary | ICD-10-CM | POA: Diagnosis not present

## 2018-07-10 DIAGNOSIS — R1013 Epigastric pain: Secondary | ICD-10-CM

## 2018-07-10 DIAGNOSIS — K284 Chronic or unspecified gastrojejunal ulcer with hemorrhage: Secondary | ICD-10-CM | POA: Diagnosis not present

## 2018-07-10 DIAGNOSIS — Z0389 Encounter for observation for other suspected diseases and conditions ruled out: Secondary | ICD-10-CM | POA: Diagnosis not present

## 2018-07-10 DIAGNOSIS — K219 Gastro-esophageal reflux disease without esophagitis: Secondary | ICD-10-CM

## 2018-07-10 DIAGNOSIS — Z8249 Family history of ischemic heart disease and other diseases of the circulatory system: Secondary | ICD-10-CM

## 2018-07-10 DIAGNOSIS — K289 Gastrojejunal ulcer, unspecified as acute or chronic, without hemorrhage or perforation: Secondary | ICD-10-CM

## 2018-07-10 DIAGNOSIS — J309 Allergic rhinitis, unspecified: Secondary | ICD-10-CM | POA: Diagnosis present

## 2018-07-10 DIAGNOSIS — K254 Chronic or unspecified gastric ulcer with hemorrhage: Secondary | ICD-10-CM | POA: Diagnosis not present

## 2018-07-10 DIAGNOSIS — Z9884 Bariatric surgery status: Secondary | ICD-10-CM | POA: Diagnosis not present

## 2018-07-10 DIAGNOSIS — K921 Melena: Secondary | ICD-10-CM | POA: Diagnosis present

## 2018-07-10 DIAGNOSIS — F1020 Alcohol dependence, uncomplicated: Secondary | ICD-10-CM | POA: Diagnosis present

## 2018-07-10 DIAGNOSIS — I10 Essential (primary) hypertension: Secondary | ICD-10-CM | POA: Diagnosis present

## 2018-07-10 DIAGNOSIS — R509 Fever, unspecified: Secondary | ICD-10-CM | POA: Diagnosis not present

## 2018-07-10 DIAGNOSIS — Z03818 Encounter for observation for suspected exposure to other biological agents ruled out: Secondary | ICD-10-CM | POA: Diagnosis not present

## 2018-07-10 DIAGNOSIS — D509 Iron deficiency anemia, unspecified: Secondary | ICD-10-CM | POA: Diagnosis not present

## 2018-07-10 DIAGNOSIS — Z90710 Acquired absence of both cervix and uterus: Secondary | ICD-10-CM

## 2018-07-10 DIAGNOSIS — F1021 Alcohol dependence, in remission: Secondary | ICD-10-CM

## 2018-07-10 DIAGNOSIS — K228 Other specified diseases of esophagus: Secondary | ICD-10-CM | POA: Diagnosis not present

## 2018-07-10 LAB — CBC
HCT: 29.2 % — ABNORMAL LOW (ref 36.0–46.0)
Hemoglobin: 8.9 g/dL — ABNORMAL LOW (ref 12.0–15.0)
MCH: 27.1 pg (ref 26.0–34.0)
MCHC: 30.5 g/dL (ref 30.0–36.0)
MCV: 88.8 fL (ref 80.0–100.0)
Platelets: 287 10*3/uL (ref 150–400)
RBC: 3.29 MIL/uL — ABNORMAL LOW (ref 3.87–5.11)
RDW: 15.7 % — ABNORMAL HIGH (ref 11.5–15.5)
WBC: 7.3 10*3/uL (ref 4.0–10.5)
nRBC: 0 % (ref 0.0–0.2)

## 2018-07-10 LAB — COMPREHENSIVE METABOLIC PANEL
ALT: 24 U/L (ref 0–44)
AST: 25 U/L (ref 15–41)
Albumin: 3.3 g/dL — ABNORMAL LOW (ref 3.5–5.0)
Alkaline Phosphatase: 60 U/L (ref 38–126)
Anion gap: 6 (ref 5–15)
BUN: 11 mg/dL (ref 6–20)
CO2: 27 mmol/L (ref 22–32)
Calcium: 8.8 mg/dL — ABNORMAL LOW (ref 8.9–10.3)
Chloride: 108 mmol/L (ref 98–111)
Creatinine, Ser: 0.6 mg/dL (ref 0.44–1.00)
GFR calc Af Amer: >60 mL/min (ref >60)
GFR calc non Af Amer: >60 mL/min (ref >60)
Glucose, Bld: 104 mg/dL — ABNORMAL HIGH (ref 70–99)
Potassium: 4.2 mmol/L (ref 3.5–5.1)
Sodium: 141 mmol/L (ref 135–145)
Total Bilirubin: 0.4 mg/dL (ref 0.3–1.2)
Total Protein: 5.9 g/dL — ABNORMAL LOW (ref 6.5–8.1)

## 2018-07-10 MED ORDER — LORAZEPAM 2 MG/ML IJ SOLN: 1.0000 mg | Freq: Four times a day (QID) | INTRAMUSCULAR | Status: DC | PRN

## 2018-07-10 MED ORDER — ACETAMINOPHEN 325 MG PO TABS
650.0000 mg | ORAL_TABLET | Freq: Four times a day (QID) | ORAL | Status: DC | PRN
  Filled 2018-07-10: qty 2

## 2018-07-10 MED ORDER — PANTOPRAZOLE SODIUM 40 MG IV SOLR
40.0000 mg | Freq: Once | INTRAVENOUS | Status: AC
  Administered 2018-07-10: 40 mg via INTRAVENOUS
  Filled 2018-07-10: qty 40

## 2018-07-10 MED ORDER — SODIUM CHLORIDE 0.9 % IV SOLN
INTRAVENOUS | Status: AC
  Administered 2018-07-11: 03:00:00 via INTRAVENOUS

## 2018-07-10 MED ORDER — FOLIC ACID 1 MG PO TABS
1.0000 mg | ORAL_TABLET | Freq: Every day | ORAL | Status: DC
  Administered 2018-07-11 – 2018-07-12 (×2): 1 mg via ORAL
  Filled 2018-07-10 (×2): qty 1

## 2018-07-10 MED ORDER — THIAMINE HCL 100 MG/ML IJ SOLN: 100.0000 mg | Freq: Every day | INTRAMUSCULAR | Status: DC

## 2018-07-10 MED ORDER — ACETAMINOPHEN 650 MG RE SUPP
650.0000 mg | Freq: Four times a day (QID) | RECTAL | Status: DC | PRN
  Administered 2018-07-11: 650 mg via RECTAL
  Filled 2018-07-10: qty 1

## 2018-07-10 MED ORDER — OMEPRAZOLE 40 MG PO CPDR: 40.0000 mg | DELAYED_RELEASE_CAPSULE | Freq: Two times a day (BID) | ORAL | 11 refills | Status: DC

## 2018-07-10 MED ORDER — ALBUTEROL SULFATE (2.5 MG/3ML) 0.083% IN NEBU: 3.0000 mL | INHALATION_SOLUTION | RESPIRATORY_TRACT | Status: DC | PRN

## 2018-07-10 MED ORDER — VITAMIN B-1 100 MG PO TABS
100.0000 mg | ORAL_TABLET | Freq: Every day | ORAL | Status: DC
  Administered 2018-07-11 – 2018-07-12 (×2): 100 mg via ORAL
  Filled 2018-07-10 (×2): qty 1

## 2018-07-10 MED ORDER — LORAZEPAM 1 MG PO TABS: 1.0000 mg | ORAL_TABLET | Freq: Four times a day (QID) | ORAL | Status: DC | PRN

## 2018-07-10 MED ORDER — PANTOPRAZOLE SODIUM 40 MG IV SOLR
40.0000 mg | Freq: Two times a day (BID) | INTRAVENOUS | Status: DC
  Administered 2018-07-11 – 2018-07-12 (×3): 40 mg via INTRAVENOUS
  Filled 2018-07-10 (×3): qty 40

## 2018-07-10 MED ORDER — ADULT MULTIVITAMIN W/MINERALS CH
1.0000 | ORAL_TABLET | Freq: Every day | ORAL | Status: DC
  Administered 2018-07-11 – 2018-07-12 (×2): 1 via ORAL
  Filled 2018-07-10 (×2): qty 1

## 2018-07-10 MED ORDER — IOHEXOL 300 MG/ML  SOLN
100.0000 mL | Freq: Once | INTRAMUSCULAR | Status: AC | PRN
  Administered 2018-07-10: 100 mL via INTRAVENOUS

## 2018-07-10 NOTE — Patient Instructions (Addendum)
If you are age 53 or older, your body mass index should be between 23-30. Your Body mass index is 22.68 kg/m. If this is out of the aforementioned range listed, please consider follow up with your Primary Care Provider.  If you are age 72 or younger, your body mass index should be between 19-25. Your Body mass index is 22.68 kg/m. If this is out of the aformentioned range listed, please consider follow up with your Primary Care Provider.   We have sent the following medications to your pharmacy for you to pick up at your convenience: Prilosec capsule   Go to [facility] Emergency Room to be admitted.    Thank you for choosing me and [facility].   Lorin Glass, PA-C

## 2018-07-10 NOTE — ED Notes (Signed)
Admitting MD at bedside.

## 2018-07-10 NOTE — Progress Notes (Signed)
Subjective:    Patient ID: [name], female    DOB: [birth year], 53 y.o.   MRN: [id number]  HPI [name] is a pleasant 53 year old white female, new to GI today self-referred and known remotely to Dr. Sharlett Iles and last seen in 2014. 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  Also with history of hypertension, GERD, restless leg syndrome and prior EtOH abuse. She was admitted to the hospital in February 2020 here in transfer from [facility] and found to have a perforated marginal ulcer.  She underwent exploratory lap repair and patch of the perforated ulcer, and was discharged on Prilosec capsules 40 mg twice daily to be opened and sprinkled on food. She comes here today stating that she has been having problems with abdominal pain over the past 3 weeks or so.  She had been seen by Dr. Radford Pax in his office towards the end of May and thought she was to be scheduled for an endoscopy but was scheduled for an upper GI which was done on 06/26/2018.  This did show a normal gastrojejunostomy and mildly decreased esophageal motility.  The patient tells me that Dr. Hassell Done is out of the office until some point in July and she felt she needed to be seen. The past couple of weeks she has been having intermittent epigastric pain and some epigastric and right-sided abdominal pain worsened 15 to 20 minutes after eating.  She is also been having bowel movements which had been loose about 15 to 20 minutes after meals.  With anything that she eats or drinks she says she feels that hit her stomach and it hurts.  He describes it as a gnawing type pain.  Over the past 3 days she has been nauseated and having more ongoing constant pain.  She also started noticing black tarry stools over the past couple of days.  She says she had 6 bowel movements yesterday all tarry black and today so far had 3 tarry bowel movements.  She is  not been on any aspirin or NSAIDs, no Pepto-Bismol. No fever or chills no hematemesis.  Review of Systems Pertinent positive and negative review of systems were noted in the above HPI section.  All other review of systems was otherwise negative.  Outpatient Encounter Medications as of 07/10/2018  Medication Sig   ALPRAZolam (XANAX) 1 MG tablet Take 0.25-1 mg by mouth as needed (before procedures or anxiety).    Cholecalciferol (VITAMIN D3) 2000 units capsule Take 1 capsule (2,000 Units total) by mouth daily.   Cyanocobalamin 1000 MCG/ML KIT Inject 1,000 mcg as directed every 30 (thirty) days.   cyclobenzaprine (FLEXERIL) 10 MG tablet Take 1 tablet (10 mg total) by mouth 3 (three) times daily as needed. (Patient taking differently: Take 10 mg by mouth 3 (three) times daily as needed for muscle spasms. )   dextroamphetamine (DEXTROSTAT) 10 MG tablet Take 10 mg by mouth 3 (three) times daily.   fluticasone (FLONASE) 50 MCG/ACT nasal spray Place 2 sprays into both nostrils daily. (Patient taking differently: Place 2 sprays into both nostrils daily as needed for allergies or rhinitis. )   loperamide (ANTI-DIARRHEAL) 2 MG capsule Take 2 mg by mouth 3 (three) times daily.   rOPINIRole (REQUIP) 1 MG tablet TAKE 1 TABLET (1 MG TOTAL) BY MOUTH AT BEDTIME. (Patient taking differently: Take 1 mg by mouth at bedtime. )   SYRINGE-NEEDLE, DISP, 3 ML (B-D 3CC  LUER-LOK SYR 25GX5/8") 25G X 5/8" 3 ML MISC USE AS DIRECTED WITH B-12 INJECTION   trimethoprim (TRIMPEX) 100 MG tablet Take 1 tablet (100 mg total) by mouth daily. (Patient taking differently: Take 100 mg by mouth daily as needed ("as directed, for bladder infections"). )   Vilazodone HCl (VIIBRYD) 20 MG TABS Take 1 tablet by mouth daily. At lunch   Vilazodone HCl (VIIBRYD) 40 MG TABS Take 40 mg by mouth daily. In the morning   Vitamin D, Ergocalciferol, (DRISDOL) 50000 units CAPS capsule Take 1 capsule (50,000 Units total) by mouth every 7  (seven) days.   omeprazole (PRILOSEC) 20 MG capsule Take 1 capsule (20 mg total) by mouth 2 (two) times daily before a meal. (Patient not taking: Reported on 07/10/2018)   omeprazole (PRILOSEC) 40 MG capsule Take 1 capsule (40 mg total) by mouth 2 (two) times a day. Open capsule and sprinkle on applesauce.   [DISCONTINUED] Azelastine-Fluticasone (DYMISTA) 137-50 MCG/ACT SUSP Place 1 spray into the nose 2 (two) times daily.   [DISCONTINUED] gabapentin (NEURONTIN) 100 MG capsule TAKE 2 CAPSULES (200 MG TOTAL) BY MOUTH 2 (TWO) TIMES DAILY.   [DISCONTINUED] gabapentin (NEURONTIN) 300 MG capsule TAKE NIGHTLY AS DIRECTED   [DISCONTINUED] loratadine (CLARITIN) 10 MG tablet Take 10 mg by mouth daily.     [DISCONTINUED] montelukast (SINGULAIR) 10 MG tablet Take 10 mg by mouth at bedtime.     [DISCONTINUED] RABEprazole (ACIPHEX) 20 MG tablet Take 20 mg by mouth daily.     [DISCONTINUED] sucralfate (CARAFATE) 1 g tablet Crush and mix 1 tablet with a glass of water four times daily   [DISCONTINUED] traZODone (DESYREL) 100 MG tablet Take 100 mg by mouth at bedtime.     No facility-administered encounter medications on file as of 07/10/2018.    Allergies  Allergen Reactions   Penicillins Other (See Comments)    Did it involve swelling of the face/tongue/throat, SOB, or low BP? Unk Did it involve sudden or severe rash/hives, skin peeling, or any reaction on the inside of your mouth or nose? Unk Did you need to seek medical attention at a hospital or doctor's office? Unk When did it last happen? Was 53 years old; reaction not recalled If all above answers are "NO", may proceed with cephalosporin use.    Codeine Itching   Patient Active Problem List   Diagnosis Date Noted   Exposure to Covid-19 Virus 05/28/2018   Gastroesophageal reflux disease 05/28/2018   Free intraperitoneal air 03/08/2018   Perforated ulcer ([place]) 03/08/2018   Cervical radiculopathy at C7 02/22/2018   Essential  hypertension 08/02/2017   Hematoma of hip, left, initial encounter 06/28/2017   Neck pain 06/28/2017   Nonallopathic lesion of cervical region 06/28/2017   Nonallopathic lesion of thoracic region 06/28/2017   Nonallopathic lesion of lumbar region 06/28/2017   Muscle spasm 04/20/2017   URI (upper respiratory infection) 10/02/2016   Preventative health care 05/01/2016   Piriformis syndrome, right 11/15/2015   Degenerative arthritis of knee, bilateral 06/02/2015   Patellofemoral arthritis of left knee 05/04/2015   Cough 09/02/2013   Acute pharyngitis 09/02/2013   Sinusitis 06/25/2013   Dog bite of face 04/16/2013   Gastroenteritis 04/16/2013   Allergic rhinitis 04/16/2013   Mass of soft tissue 08/02/2012   RESTLESS LEGS SYNDROME 01/21/2009   LYMPHOPENIA 03/23/2008   Depression 02/27/2008   HEMANGIOMA, SKIN 02/04/2008   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 02/19/2007   Social History   Socioeconomic History  Marital status: Married    Spouse name: Not on file   Number of children: Not on file   Years of education: Not on file   Highest education level: Not on file  Occupational History    Employer: [employer]    Comment: [employer]  Social Needs   Financial resource strain: Not on file   Food insecurity    Worry: Not on file    Inability: Not on file   Transportation needs    Medical: Not on file    Non-medical: Not on file  Tobacco Use   Smoking status: Former Smoker    Quit date: 01/24/1987    Years since quitting: 31.4   Smokeless tobacco: Never Used  Substance and Sexual Activity   Alcohol use: Yes    Alcohol/week: 35.0 standard drinks    Types: 35 Cans of beer per week    Comment: Pt states she drinks 5 beers mosts days   Drug use: No   Sexual activity: Not Currently  Lifestyle   Physical activity    Days per week: Not on file    Minutes per session: Not on file   Stress: Not on file  Relationships   Social  connections    Talks on phone: Not on file    Gets together: Not on file    Attends religious service: Not on file    Active member of club or organization: Not on file    Attends meetings of clubs or organizations: Not on file    Relationship status: Not on file   Intimate partner violence    Fear of current or ex partner: Not on file    Emotionally abused: Not on file    Physically abused: Not on file    Forced sexual activity: Not on file  Other Topics Concern   Not on file  Social History Narrative   Regular exercise--no (because of knees)    Ms. Starrett family history includes Cancer in her father, maternal grandfather, and paternal grandfather; Depression in her brother and sister; Diabetes in her maternal grandfather, maternal grandmother, paternal grandfather, and paternal grandmother; Hypertension in her father; Liver disease in her maternal grandfather; Stroke in her mother.      Objective:    Vitals:   07/10/18 1622  BP: 110/68  Pulse: 84  Temp: 98.6 F (37 C)    Physical Exam;Well-developed well-nourished white female in no acute distress.  Height 5 foot 2, Weight, 124 BMI 22.6  HEENT; nontraumatic normocephalic, EOMI, PE R LA, sclera anicteric. Oropharynx; not examined, mask/COVID Neck; supple, no JVD Cardiovascular; regular rate and rhythm with S1-S2, no murmur rub or gallop Pulmonary; Clear bilaterally Abdomen; soft, mildly tender epigastrium, nondistended, no palpable mass or hepatosplenomegaly, bowel sounds are active, midline incisional scar Rectal; scant amount of stool in the rectal vault dark and heme positive.pau Skin; benign exam, no jaundice rash or appreciable lesions Extremities; no clubbing cyanosis or edema skin warm and dry Neuro/Psych; alert and oriented x4, grossly nonfocal mood and affect appropriate       Assessment & Plan:   #74 53 year old white female status post gastric bypass with Roux-en-Y gastrojejunostomy 2007 who is status  post exploratory lap with repair and patch of a perforated marginal ulcer February 2020  Patient presents now with 3-week history of epigastric pain intermittent with nausea, now with progressive symptoms over the past 3 to 4 days with more constant which she describes as gnawing epigastric pain, urgent postprandial bowel movements  15 to 20 minutes after eating and over the past 2 days has had tarry black stools with 6 bowel movements yesterday and 3 bowel movements today. She is hemodynamically stable today. Stool is very dark and heme positive on exam today  I suspect she is having a GI bleed secondary to recurrent ulcer -question anastomotic ulceration. She had been on Prilosec tablets over the past couple of months rather than open sprinkled capsules and therefore likely minimally absorbed.  #2 history of restless leg syndrome #3 hypertension #4 Remote history of late dumping syndrome/IBS  Plan; Patient is advised to proceed to the emergency room for evaluation and admission.  She prefers [facility], and friend will transport her now.  I have notified Dr. Silverio Decamp  who is on-call for GI this evening and expect she will need to be scheduled for EGD tomorrow morning with Dr. Fuller Plan.  Have sent a prescription for omeprazole 40 mg capsules 1 p.o. twice daily, to be opened and sprinkled on applesauce.  #60 /11 refills  Jaxyn Mestas S Lucillia Corson PA-C 07/10/2018   Cc: [name], *

## 2018-07-10 NOTE — Telephone Encounter (Signed)
Covid-19 screening questions   Do you now or have you had a fever in the last 14 days? No  Do you have any respiratory symptoms of shortness of breath or cough now or in the last 14 days? No  Do you have any family members or close contacts with diagnosed or suspected Covid-19 in the past 14 days?  No  Have you been tested for Covid-19 and found to be positive? No, patient admitted to be tested and came back negative due to working around some coworkers that were dx with Covid-19. Tested April 2020

## 2018-07-10 NOTE — ED Triage Notes (Signed)
Patient sent by doctor for positive occult blood in stools - patient reports abdominal pain and cramping, nausea x 1 week and noticed "jet black stools" 2 days ago. History of perforated ulcer that required emergency surgery back in February.

## 2018-07-10 NOTE — H&P (Signed)
History and Physical    [name] PIR:[id number] DOB: [birth year] DOA: 07/10/2018  PCP: [name], DO Patient coming from: GI office  Chief Complaint: Melena  HPI: [name] is a 53 y.o. female with medical history significant of gastric bypass with Roux-en-Y gastrojejunostomy in 2007, status post ex lap with repair and patch of a perforated marginal ulcer in February 2020, anemia, anxiety, asthma, depression, GERD being sent to the hospital from GI office for evaluation of melena.  Stool was very dark and heme positive on exam done at GI office.  GI is planning on doing an EGD tomorrow morning here at [facility] with Dr. Fuller Plan.  Patient reports having intermittent epigastric abdominal pain since February.  States the pain has been worse for the past few days.  In addition, reports having dark stools for the past few days.  Denies any NSAID use.  Does report drinking 5-6 beers daily.  Denies any chest pain, palpitations, shortness of breath, or dizziness/lightheadedness.  ED Course: Hemodynamically stable.  Hemoglobin 8.9, was 10.4 on March 10, 2018. CT abdomen pelvis negative for acute finding or visible complicating feature of prior gastric bypass.  Received Protonix 40 mg in the ED.  Review of Systems:  All systems reviewed and apart from history of presenting illness, are negative.  Past Medical History:  Diagnosis Date  . Allergy   . Anemia   . Anxiety   . Asthma   . Depression   . GERD (gastroesophageal reflux disease)   . Heart murmur     Past Surgical History:  Procedure Laterality Date  . ABDOMINAL HYSTERECTOMY     fibroids  . GASTRIC BYPASS  2007  . LAPAROTOMY N/A 03/08/2018   Procedure: EXPLORATORY LAPAROTOMY, REPAIR AND PATCH CLOSURE OF PERFORATED ULCER;  Surgeon: Georganna Skeans, MD;  Location: [place];  Service: General;  Laterality: N/A;  . TUBAL LIGATION       reports that she quit smoking about 31 years ago. She has never used smokeless tobacco. She  reports current alcohol use of about 35.0 standard drinks of alcohol per week. She reports that she does not use drugs.  Allergies  Allergen Reactions  . Penicillins Other (See Comments)    Did it involve swelling of the face/tongue/throat, SOB, or low BP? Unk Did it involve sudden or severe rash/hives, skin peeling, or any reaction on the inside of your mouth or nose? Unk Did you need to seek medical attention at a hospital or doctor's office? Unk When did it last happen? Was 53 years old; reaction not recalled If all above answers are "NO", may proceed with cephalosporin use.   . Codeine Itching    Family History  Problem Relation Age of Onset  . Hypertension Father   . Cancer Father        Prostate  . Depression Sister   . Depression Brother   . Diabetes Maternal Grandmother   . Diabetes Maternal Grandfather   . Liver disease Maternal Grandfather   . Cancer Maternal Grandfather        Prostate  . Diabetes Paternal Grandmother   . Diabetes Paternal Grandfather   . Cancer Paternal Grandfather        Lung, Prostate  . Stroke Mother     Prior to Admission medications   Medication Sig Start Date End Date Taking? Authorizing Provider  ALPRAZolam Duanne Moron) 1 MG tablet Take 0.25-1 mg by mouth as needed (before procedures or anxiety).  05/31/12   [provider]  Cholecalciferol (VITAMIN D3) 2000 units capsule Take 1 capsule (2,000 Units total) by mouth daily. 05/01/16   [name], DO  Cyanocobalamin 1000 MCG/ML KIT Inject 1,000 mcg as directed every 30 (thirty) days.    [provider]  cyclobenzaprine (FLEXERIL) 10 MG tablet Take 1 tablet (10 mg total) by mouth 3 (three) times daily as needed. Patient taking differently: Take 10 mg by mouth 3 (three) times daily as needed for muscle spasms.  04/28/16   [name], DO  dextroamphetamine (DEXTROSTAT) 10 MG tablet Take 10 mg by mouth 3 (three) times daily.    [provider]  fluticasone  (FLONASE) 50 MCG/ACT nasal spray Place 2 sprays into both nostrils daily. Patient taking differently: Place 2 sprays into both nostrils daily as needed for allergies or rhinitis.  04/20/17   [name], DO  loperamide (ANTI-DIARRHEAL) 2 MG capsule Take 2 mg by mouth 3 (three) times daily.    [provider]  omeprazole (PRILOSEC) 20 MG capsule Take 1 capsule (20 mg total) by mouth 2 (two) times daily before a meal. Patient not taking: Reported on 07/10/2018 07/01/18   Carollee Herter, Alferd Apa, DO  omeprazole (PRILOSEC) 40 MG capsule Take 1 capsule (40 mg total) by mouth 2 (two) times a day. Open capsule and sprinkle on applesauce. 07/10/18   Esterwood, Amy S, PA-C  rOPINIRole (REQUIP) 1 MG tablet TAKE 1 TABLET (1 MG TOTAL) BY MOUTH AT BEDTIME. Patient taking differently: Take 1 mg by mouth at bedtime.  04/20/15   Lowne Chase, Yvonne R, DO  SYRINGE-NEEDLE, DISP, 3 ML (B-D 3CC LUER-LOK SYR 25GX5/8") 25G X 5/8" 3 ML MISC USE AS DIRECTED WITH B-12 INJECTION 10/15/15   Carollee Herter, Alferd Apa, DO  trimethoprim (TRIMPEX) 100 MG tablet Take 1 tablet (100 mg total) by mouth daily. Patient taking differently: Take 100 mg by mouth daily as needed ("as directed, for bladder infections").  08/03/16   [name], DO  Vilazodone HCl (VIIBRYD) 20 MG TABS Take 1 tablet by mouth daily. At lunch    [provider]  Vilazodone HCl (VIIBRYD) 40 MG TABS Take 40 mg by mouth daily. In the morning    [provider]  Vitamin D, Ergocalciferol, (DRISDOL) 50000 units CAPS capsule Take 1 capsule (50,000 Units total) by mouth every 7 (seven) days. 05/01/16   [name], DO  Azelastine-Fluticasone (DYMISTA) 137-50 MCG/ACT SUSP Place 1 spray into the nose 2 (two) times daily. 12/20/10 04/14/11  [name], DO  loratadine (CLARITIN) 10 MG tablet Take 10 mg by mouth daily.    04/14/11  [provider]  montelukast (SINGULAIR) 10 MG tablet Take 10 mg by mouth at bedtime.     04/14/11  [provider]  RABEprazole (ACIPHEX) 20 MG tablet Take 20 mg by mouth daily.    04/14/11  [provider]  traZODone (DESYREL) 100 MG tablet Take 100 mg by mouth at bedtime.    04/14/11  [provider]    Physical Exam: Vitals:   07/10/18 1806 07/10/18 2142 07/10/18 2145 07/10/18 2256  BP: 130/77 102/70 126/83 118/80  Pulse: 83 80 71 79  Resp: 17 18    Temp: 98.7 F (37.1 C)     TempSrc: Oral     SpO2: 100% 100% 100% 92%    Physical Exam  Constitutional: She is oriented to person, place, and time. She appears well-developed and well-nourished. No distress.  HENT:  Head: Normocephalic.  Mouth/Throat: Oropharynx is clear and moist.  Eyes: Right eye exhibits no discharge. Left eye exhibits no discharge.  Neck: Neck supple.  Cardiovascular: Normal rate, regular rhythm and intact distal pulses.  Pulmonary/Chest: Effort normal and breath sounds normal. No respiratory distress. She has no wheezes. She has no rales.  Abdominal: Soft. Bowel sounds are normal. She exhibits no distension. There is abdominal tenderness. There is guarding. There is no rebound.  Epigastrium tender to palpation with guarding No rebound or rigidity  Musculoskeletal:        General: No edema.  Neurological: She is alert and oriented to person, place, and time.  Skin: Skin is warm and dry. She is not diaphoretic.     Labs on Admission: I have personally reviewed following labs and imaging studies  CBC: Recent Labs  Lab 07/10/18 1808  WBC 7.3  HGB 8.9*  HCT 29.2*  MCV 88.8  PLT 962   Basic Metabolic Panel: Recent Labs  Lab 07/10/18 1808  NA 141  K 4.2  CL 108  CO2 27  GLUCOSE 104*  BUN 11  CREATININE 0.60  CALCIUM 8.8*   GFR: Estimated Creatinine Clearance: 64.3 mL/min (by C-G formula based on SCr of 0.6 mg/dL). Liver Function Tests: Recent Labs  Lab 07/10/18 1808  AST 25  ALT 24  ALKPHOS 60  BILITOT 0.4  PROT 5.9*  ALBUMIN 3.3*   No  results for input(s): LIPASE, AMYLASE in the last 168 hours. No results for input(s): AMMONIA in the last 168 hours. Coagulation Profile: No results for input(s): INR, PROTIME in the last 168 hours. Cardiac Enzymes: No results for input(s): CKTOTAL, CKMB, CKMBINDEX, TROPONINI in the last 168 hours. BNP (last 3 results) No results for input(s): PROBNP in the last 8760 hours. HbA1C: No results for input(s): HGBA1C in the last 72 hours. CBG: No results for input(s): GLUCAP in the last 168 hours. Lipid Profile: No results for input(s): CHOL, HDL, LDLCALC, TRIG, CHOLHDL, LDLDIRECT in the last 72 hours. Thyroid Function Tests: No results for input(s): TSH, T4TOTAL, FREET4, T3FREE, THYROIDAB in the last 72 hours. Anemia Panel: No results for input(s): VITAMINB12, FOLATE, FERRITIN, TIBC, IRON, RETICCTPCT in the last 72 hours. Urine analysis:    Component Value Date/Time   COLORURINE YELLOW 08/01/2016 1625   APPEARANCEUR [place] Cloud[place] (A) 08/01/2016 1625   LABSPEC 1.010 08/01/2016 1625   PHURINE 6.5 08/01/2016 1625   GLUCOSEU NEGATIVE 08/01/2016 1625   HGBUR NEGATIVE 08/01/2016 1625   HGBUR negative 01/07/2010 0814   BILIRUBINUR neg 10/02/2016 1257   KETONESUR NEGATIVE 08/01/2016 1625   PROTEINUR neg 10/02/2016 1257   UROBILINOGEN 0.2 10/02/2016 1257   UROBILINOGEN 0.2 08/01/2016 1625   NITRITE neg 10/02/2016 1257   NITRITE POSITIVE (A) 08/01/2016 1625   LEUKOCYTESUR Negative 10/02/2016 1257    Radiological Exams on Admission: Ct Abdomen Pelvis W Contrast  Result Date: 07/10/2018 CLINICAL DATA:  Abdominal pain, buckled blood in stools. EXAM: CT ABDOMEN AND PELVIS WITH CONTRAST TECHNIQUE: Multidetector CT imaging of the abdomen and pelvis was performed using the standard protocol following bolus administration of intravenous contrast. CONTRAST:  129m OMNIPAQUE IOHEXOL 300 MG/ML  SOLN COMPARISON:  03/07/2018 FINDINGS: Lower chest: Lung bases are clear. No effusions. Heart is normal size.  Hepatobiliary: No focal hepatic abnormality. Gallbladder unremarkable. Pancreas: No focal abnormality or ductal dilatation. Spleen: No focal abnormality.  Normal size. Adrenals/Urinary Tract: No adrenal abnormality. No focal renal abnormality. No stones or hydronephrosis. Urinary bladder is unremarkable. Stomach/Bowel:  Prior gastric bypass. No evidence of bowel obstruction. Bowel grossly unremarkable. Vascular/Lymphatic: No evidence of aneurysm or adenopathy. Reproductive: Prior hysterectomy.  No adnexal masses. Other: Small amount of free fluid in the pelvis.  No free air. Musculoskeletal: No acute bony abnormality. IMPRESSION: No acute findings in the abdomen or pelvis. Trace physiologic free fluid in the pelvis. Prior gastric bypass.  No visible complicating feature. Electronically Signed   By: Rolm Baptise M.D.   On: 07/10/2018 22:22   Assessment/Plan Principal Problem:   Melena Active Problems:   GERD (gastroesophageal reflux disease)   Alcohol dependence ([place])   Encounter for screening for HIV   Asthma   Melena Suspect GI bleed secondary to recurrent marginal ulcer. Hemodynamically stable.  Hemoglobin 8.9, was 10.4 on March 10, 2018. CT abdomen pelvis negative for acute finding or visible complicating feature of prior gastric bypass.  Received Protonix 40 mg in the ED. No  episodes of melena in the ED. -Keep n.p.o. -Type and screen -2 large-bore IVs -IV fluid hydration -IV PPI -Repeat CBC in a.m. -Check iron, ferritin, TIBC -GI is planning on doing an EGD tomorrow morning   GERD -Continue PPI  Alcohol dependence No signs of withdrawal at this time. -CIWA protocol; Ativan as needed -Thiamine, folate, multivitamin  Asthma -Stable.  No bronchospasm.  Albuterol PRN.  HIV screening The patient falls between the ages of 13-64 and should be screened for HIV, therefore HIV testing ordered.  Unable to safely order home medications at this time as pharmacy medication reconciliation  is pending.  DVT prophylaxis: SCDs Code Status: Full code Family Communication: No family available. Disposition Plan: Anticipate discharge after clinical improvement. Consults: [facility] Admission status: It is my clinical opinion that referral for OBSERVATION is reasonable and necessary in this patient based on the above information provided. The aforementioned taken together are felt to place the patient at high risk for further clinical deterioration. However it is anticipated that the patient may be medically stable for discharge from the hospital within 24 to 48 hours.  The medical decision making on this patient was of high complexity and the patient is at high risk for clinical deterioration, therefore this is a level 3 visit.  Shela Leff MD [facility] Pager [phone]  If 7PM-7AM, please contact night-coverage [email or web address] Password [facility]  07/10/2018, 11:17 PM

## 2018-07-11 ENCOUNTER — Encounter (HOSPITAL_COMMUNITY): Payer: Self-pay

## 2018-07-11 ENCOUNTER — Encounter (HOSPITAL_COMMUNITY)
Admission: EM | Disposition: A | Discharge status: Home / Self Care | Payer: Self-pay | Source: Home / Self Care | Attending: Internal Medicine

## 2018-07-11 ENCOUNTER — Observation Stay (HOSPITAL_COMMUNITY): Payer: BC Managed Care – PPO | Admitting: Certified Registered Nurse Anesthetist

## 2018-07-11 DIAGNOSIS — Z0389 Encounter for observation for other suspected diseases and conditions ruled out: Secondary | ICD-10-CM | POA: Diagnosis not present

## 2018-07-11 DIAGNOSIS — G2581 Restless legs syndrome: Secondary | ICD-10-CM | POA: Diagnosis present

## 2018-07-11 DIAGNOSIS — Z20828 Contact with and (suspected) exposure to other viral communicable diseases: Secondary | ICD-10-CM | POA: Diagnosis present

## 2018-07-11 DIAGNOSIS — F1020 Alcohol dependence, uncomplicated: Secondary | ICD-10-CM | POA: Diagnosis present

## 2018-07-11 DIAGNOSIS — K449 Diaphragmatic hernia without obstruction or gangrene: Secondary | ICD-10-CM | POA: Diagnosis not present

## 2018-07-11 DIAGNOSIS — Z1159 Encounter for screening for other viral diseases: Secondary | ICD-10-CM | POA: Diagnosis not present

## 2018-07-11 DIAGNOSIS — K219 Gastro-esophageal reflux disease without esophagitis: Secondary | ICD-10-CM | POA: Diagnosis present

## 2018-07-11 DIAGNOSIS — K254 Chronic or unspecified gastric ulcer with hemorrhage: Secondary | ICD-10-CM | POA: Diagnosis not present

## 2018-07-11 DIAGNOSIS — R509 Fever, unspecified: Secondary | ICD-10-CM | POA: Diagnosis not present

## 2018-07-11 DIAGNOSIS — Z87891 Personal history of nicotine dependence: Secondary | ICD-10-CM | POA: Diagnosis not present

## 2018-07-11 DIAGNOSIS — Z885 Allergy status to narcotic agent status: Secondary | ICD-10-CM | POA: Diagnosis not present

## 2018-07-11 DIAGNOSIS — Z880 Allergy status to penicillin: Secondary | ICD-10-CM | POA: Diagnosis not present

## 2018-07-11 DIAGNOSIS — I10 Essential (primary) hypertension: Secondary | ICD-10-CM | POA: Diagnosis present

## 2018-07-11 DIAGNOSIS — Z9884 Bariatric surgery status: Secondary | ICD-10-CM | POA: Diagnosis not present

## 2018-07-11 DIAGNOSIS — F419 Anxiety disorder, unspecified: Secondary | ICD-10-CM | POA: Diagnosis present

## 2018-07-11 DIAGNOSIS — K289 Gastrojejunal ulcer, unspecified as acute or chronic, without hemorrhage or perforation: Secondary | ICD-10-CM | POA: Diagnosis not present

## 2018-07-11 DIAGNOSIS — K284 Chronic or unspecified gastrojejunal ulcer with hemorrhage: Secondary | ICD-10-CM | POA: Diagnosis present

## 2018-07-11 DIAGNOSIS — Z90710 Acquired absence of both cervix and uterus: Secondary | ICD-10-CM | POA: Diagnosis not present

## 2018-07-11 DIAGNOSIS — K228 Other specified diseases of esophagus: Secondary | ICD-10-CM | POA: Diagnosis not present

## 2018-07-11 DIAGNOSIS — Z818 Family history of other mental and behavioral disorders: Secondary | ICD-10-CM | POA: Diagnosis not present

## 2018-07-11 DIAGNOSIS — D509 Iron deficiency anemia, unspecified: Secondary | ICD-10-CM

## 2018-07-11 DIAGNOSIS — Z7951 Long term (current) use of inhaled steroids: Secondary | ICD-10-CM | POA: Diagnosis not present

## 2018-07-11 DIAGNOSIS — J309 Allergic rhinitis, unspecified: Secondary | ICD-10-CM | POA: Diagnosis present

## 2018-07-11 DIAGNOSIS — R1013 Epigastric pain: Secondary | ICD-10-CM | POA: Diagnosis not present

## 2018-07-11 DIAGNOSIS — D62 Acute posthemorrhagic anemia: Secondary | ICD-10-CM | POA: Diagnosis not present

## 2018-07-11 DIAGNOSIS — F329 Major depressive disorder, single episode, unspecified: Secondary | ICD-10-CM | POA: Diagnosis present

## 2018-07-11 DIAGNOSIS — Z8249 Family history of ischemic heart disease and other diseases of the circulatory system: Secondary | ICD-10-CM | POA: Diagnosis not present

## 2018-07-11 DIAGNOSIS — Z79899 Other long term (current) drug therapy: Secondary | ICD-10-CM | POA: Diagnosis not present

## 2018-07-11 DIAGNOSIS — K921 Melena: Secondary | ICD-10-CM | POA: Diagnosis not present

## 2018-07-11 HISTORY — PX: ESOPHAGOGASTRODUODENOSCOPY (EGD) WITH PROPOFOL: SHX5813

## 2018-07-11 HISTORY — PX: HEMOSTASIS CLIP PLACEMENT: SHX6857

## 2018-07-11 HISTORY — PX: BIOPSY: SHX5522

## 2018-07-11 LAB — SARS CORONAVIRUS 2: SARS Coronavirus 2: NOT DETECTED

## 2018-07-11 LAB — CBC
HCT: 25.8 % — ABNORMAL LOW (ref 36.0–46.0)
Hemoglobin: 8 g/dL — ABNORMAL LOW (ref 12.0–15.0)
MCH: 27.1 pg (ref 26.0–34.0)
MCHC: 31 g/dL (ref 30.0–36.0)
MCV: 87.5 fL (ref 80.0–100.0)
Platelets: 232 10*3/uL (ref 150–400)
RBC: 2.95 MIL/uL — ABNORMAL LOW (ref 3.87–5.11)
RDW: 15.9 % — ABNORMAL HIGH (ref 11.5–15.5)
WBC: 4.9 10*3/uL (ref 4.0–10.5)
nRBC: 0 % (ref 0.0–0.2)

## 2018-07-11 LAB — TYPE AND SCREEN
ABO/RH(D): A POS
Antibody Screen: NEGATIVE

## 2018-07-11 LAB — IRON AND TIBC
Iron: 13 ug/dL — ABNORMAL LOW (ref 28–170)
Saturation Ratios: 3 % — ABNORMAL LOW (ref 10.4–31.8)
TIBC: 426 ug/dL (ref 250–450)
UIBC: 413 ug/dL

## 2018-07-11 LAB — FERRITIN: Ferritin: 15 ng/mL (ref 11–307)

## 2018-07-11 LAB — HEMOGLOBIN AND HEMATOCRIT, BLOOD
HCT: 24.2 % — ABNORMAL LOW (ref 36.0–46.0)
HCT: 27.6 % — ABNORMAL LOW (ref 36.0–46.0)
Hemoglobin: 7.6 g/dL — ABNORMAL LOW (ref 12.0–15.0)
Hemoglobin: 8.5 g/dL — ABNORMAL LOW (ref 12.0–15.0)

## 2018-07-11 LAB — HIV ANTIBODY (ROUTINE TESTING W REFLEX): HIV Screen 4th Generation wRfx: NONREACTIVE

## 2018-07-11 LAB — ABO/RH: ABO/RH(D): A POS

## 2018-07-11 SURGERY — ESOPHAGOGASTRODUODENOSCOPY (EGD) WITH PROPOFOL
Anesthesia: Monitor Anesthesia Care

## 2018-07-11 MED ORDER — PROPOFOL 10 MG/ML IV BOLUS
INTRAVENOUS | Status: DC | PRN
  Administered 2018-07-11 (×3): 20 mg via INTRAVENOUS

## 2018-07-11 MED ORDER — SODIUM CHLORIDE 0.9 % IV SOLN
INTRAVENOUS | Status: DC
  Administered 2018-07-11: 13:00:00 via INTRAVENOUS

## 2018-07-11 MED ORDER — PROPOFOL 500 MG/50ML IV EMUL
INTRAVENOUS | Status: DC | PRN
  Administered 2018-07-11: 50 ug/kg/min via INTRAVENOUS

## 2018-07-11 MED ORDER — LIDOCAINE 2% (20 MG/ML) 5 ML SYRINGE
INTRAMUSCULAR | Status: DC | PRN
  Administered 2018-07-11: 40 mg via INTRAVENOUS

## 2018-07-11 MED ORDER — LACTATED RINGERS IV SOLN
INTRAVENOUS | Status: DC
  Administered 2018-07-11: 1000 mL via INTRAVENOUS

## 2018-07-11 MED ORDER — ONDANSETRON HCL 4 MG/2ML IJ SOLN
4.0000 mg | Freq: Four times a day (QID) | INTRAMUSCULAR | Status: DC | PRN
  Administered 2018-07-11: 4 mg via INTRAVENOUS
  Filled 2018-07-11: qty 2

## 2018-07-11 SURGICAL SUPPLY — 15 items

## 2018-07-11 NOTE — ED Provider Notes (Signed)
[facility] Provider Note   CSN: [id number] Arrival date & time: 07/10/18  1750    History   Chief Complaint Chief Complaint  Patient presents with  . GI Bleeding    HPI [name] is a 53 y.o. female.     53 yo F with a chief complaint of dark stool.  This been going on for about 3 days.  Saw GI as an outpatient earlier today and was sent to the ED for admission.  Hemoglobin is trended down a couple points since her last check.  She is complaining of diffuse lower abdominal pain.  She has a history of a perforated gastric ulcer that had required repair.  Denies fevers or chills.  Denies cough or congestion.  Denies vomiting.  The history is provided by the patient.  Illness Severity:  Mild Onset quality:  Sudden Duration:  3 days Timing:  Constant Progression:  Worsening Chronicity:  New Associated symptoms: no chest pain, no congestion, no fever, no headaches, no myalgias, no nausea, no rhinorrhea, no shortness of breath, no vomiting and no wheezing     Past Medical History:  Diagnosis Date  . Allergy   . Anemia   . Anxiety   . Asthma   . Depression   . GERD (gastroesophageal reflux disease)   . Heart murmur     Patient Active Problem List   Diagnosis Date Noted  . Abdominal pain, epigastric   . Marginal ulcers   . Melena 07/10/2018  . Alcohol dependence ([place]) 07/10/2018  . Encounter for screening for HIV 07/10/2018  . Asthma 07/10/2018  . Exposure to Covid-19 Virus 05/28/2018  . GERD (gastroesophageal reflux disease) 05/28/2018  . Free intraperitoneal air 03/08/2018  . Perforated ulcer ([place]) 03/08/2018  . Cervical radiculopathy at C7 02/22/2018  . Essential hypertension 08/02/2017  . Hematoma of hip, left, initial encounter 06/28/2017  . Neck pain 06/28/2017  . Nonallopathic lesion of cervical region 06/28/2017  . Nonallopathic lesion of thoracic region 06/28/2017  . Nonallopathic lesion of lumbar region 06/28/2017  .  Muscle spasm 04/20/2017  . URI (upper respiratory infection) 10/02/2016  . Preventative health care 05/01/2016  . Piriformis syndrome, right 11/15/2015  . Degenerative arthritis of knee, bilateral 06/02/2015  . Patellofemoral arthritis of left knee 05/04/2015  . Cough 09/02/2013  . Acute pharyngitis 09/02/2013  . Sinusitis 06/25/2013  . Dog bite of face 04/16/2013  . Gastroenteritis 04/16/2013  . Allergic rhinitis 04/16/2013  . Mass of soft tissue 08/02/2012  . RESTLESS LEGS SYNDROME 01/21/2009  . LYMPHOPENIA 03/23/2008  . Depression 02/27/2008  . HEMANGIOMA, SKIN 02/04/2008  . Lap Venita Lick October 2007 for BMI 46 02/19/2007    Past Surgical History:  Procedure Laterality Date  . ABDOMINAL HYSTERECTOMY     fibroids  . BIOPSY  07/11/2018   Procedure: BIOPSY;  Surgeon: [name], MD;  Location: [place];  Service: Endoscopy;;  . ESOPHAGOGASTRODUODENOSCOPY (EGD) WITH PROPOFOL N/A 07/11/2018   Procedure: ESOPHAGOGASTRODUODENOSCOPY (EGD) WITH PROPOFOL;  Surgeon: [name], MD;  Location: [facility] ENDOSCOPY;  Service: Endoscopy;  Laterality: N/A;  . GASTRIC BYPASS  2007  . HEMOSTASIS CLIP PLACEMENT  07/11/2018   Procedure: HEMOSTASIS CLIP PLACEMENT;  Surgeon: [name], MD;  Location: [facility];  Service: Endoscopy;;  . LAPAROTOMY N/A 03/08/2018   Procedure: EXPLORATORY LAPAROTOMY, REPAIR AND PATCH CLOSURE OF PERFORATED ULCER;  Surgeon: Georganna Skeans, MD;  Location: [place];  Service: General;  Laterality: N/A;  .  TUBAL LIGATION       OB History   No obstetric history on file.      Home Medications    Prior to Admission medications   Medication Sig Start Date End Date Taking? Authorizing Provider  ALPRAZolam Duanne Moron) 1 MG tablet Take 0.25-1 mg by mouth as needed (before procedures or anxiety).  05/31/12  Yes [provider]  Cholecalciferol (VITAMIN D3) 2000 units capsule Take 1 capsule (2,000 Units total) by mouth daily. 05/01/16  Yes Roma Schanz R, DO  Cyanocobalamin 1000 MCG/ML KIT Inject 1,000 mcg as directed every 30 (thirty) days.   Yes [provider]  cyclobenzaprine (FLEXERIL) 10 MG tablet Take 1 tablet (10 mg total) by mouth 3 (three) times daily as needed. Patient taking differently: Take 10 mg by mouth 2 (two) times daily as needed for muscle spasms.  04/28/16  Yes Roma Schanz R, DO  dextroamphetamine (DEXTROSTAT) 10 MG tablet Take 20 mg by mouth 3 (three) times daily.    Yes [provider]  fluticasone (FLONASE) 50 MCG/ACT nasal spray Place 2 sprays into both nostrils daily. Patient taking differently: Place 2 sprays into both nostrils daily as needed for allergies or rhinitis.  04/20/17  Yes Roma Schanz R, DO  loperamide (ANTI-DIARRHEAL) 2 MG capsule Take 2 mg by mouth 3 (three) times daily.   Yes [provider]  Probiotic Product (ALIGN) CHEW Chew 2 tablets by mouth daily.   Yes [provider]  rOPINIRole (REQUIP) 1 MG tablet TAKE 1 TABLET (1 MG TOTAL) BY MOUTH AT BEDTIME. Patient taking differently: Take 1 mg by mouth at bedtime.  04/20/15  Yes Lowne Chase, Yvonne R, DO  SYRINGE-NEEDLE, DISP, 3 ML (B-D 3CC LUER-LOK SYR 25GX5/8") 25G X 5/8" 3 ML MISC USE AS DIRECTED WITH B-12 INJECTION 10/15/15  Yes Lowne Lyndal Pulley R, DO  trimethoprim (TRIMPEX) 100 MG tablet Take 1 tablet (100 mg total) by mouth daily. Patient taking differently: Take 100 mg by mouth daily as needed ("as directed, for bladder infections").  08/03/16  Yes [name], DO  Vilazodone HCl (VIIBRYD) 20 MG TABS Take 1 tablet by mouth daily. At lunch   Yes [provider]  Vilazodone HCl (VIIBRYD) 40 MG TABS Take 40 mg by mouth daily. In the morning   Yes [provider]  omeprazole (PRILOSEC) 20 MG capsule Take 1 capsule (20 mg total) by mouth 2 (two) times daily before a meal. Patient not taking: Reported on 07/10/2018 07/01/18   Carollee Herter, Alferd Apa, DO  omeprazole (PRILOSEC) 40  MG capsule Take 1 capsule (40 mg total) by mouth 2 (two) times a day. Open capsule and sprinkle on applesauce. Patient not taking: Reported on 07/11/2018 07/10/18   Esterwood, Amy S, PA-C  Vitamin D, Ergocalciferol, (DRISDOL) 50000 units CAPS capsule Take 1 capsule (50,000 Units total) by mouth every 7 (seven) days. Patient not taking: Reported on 07/11/2018 05/01/16   Carollee Herter, Alferd Apa, DO  Azelastine-Fluticasone [facility]) 137-50 MCG/ACT SUSP Place 1 spray into the nose 2 (two) times daily. 12/20/10 04/14/11  [name], DO  loratadine (CLARITIN) 10 MG tablet Take 10 mg by mouth daily.    04/14/11  [provider]  montelukast (SINGULAIR) 10 MG tablet Take 10 mg by mouth at bedtime.    04/14/11  [provider]  RABEprazole (ACIPHEX) 20 MG tablet Take 20 mg by mouth daily.    04/14/11  [provider]  traZODone (  DESYREL) 100 MG tablet Take 100 mg by mouth at bedtime.    04/14/11  [provider]    Family History Family History  Problem Relation Age of Onset  . Hypertension Father   . Cancer Father        Prostate  . Depression Sister   . Depression Brother   . Diabetes Maternal Grandmother   . Diabetes Maternal Grandfather   . Liver disease Maternal Grandfather   . Cancer Maternal Grandfather        Prostate  . Diabetes Paternal Grandmother   . Diabetes Paternal Grandfather   . Cancer Paternal Grandfather        Lung, Prostate  . Stroke Mother     Social History Social History   Tobacco Use  . Smoking status: Former Smoker    Quit date: 01/24/1987    Years since quitting: 31.4  . Smokeless tobacco: Never Used  Substance Use Topics  . Alcohol use: Yes    Alcohol/week: 35.0 standard drinks    Types: 35 Cans of beer per week    Comment: Pt states she drinks 5 beers mosts days  . Drug use: No     Allergies   Penicillins and Codeine   Review of Systems Review of Systems  Constitutional: Negative for chills and fever.  HENT:  Negative for congestion and rhinorrhea.   Eyes: Negative for redness and visual disturbance.  Respiratory: Negative for shortness of breath and wheezing.   Cardiovascular: Negative for chest pain and palpitations.  Gastrointestinal: Positive for blood in stool. Negative for nausea and vomiting.  Genitourinary: Negative for dysuria and urgency.  Musculoskeletal: Negative for arthralgias and myalgias.  Skin: Negative for pallor and wound.  Neurological: Negative for dizziness and headaches.     Physical Exam Updated Vital Signs BP 122/75 (BP Location: Right Arm)   Pulse 87   Temp (!) 100.4 F (38 C) (Oral)   Resp 20   Ht '5\' 2"'  (1.575 m)   Wt 54.6 kg Comment: scale a  SpO2 100%   BMI 22.00 kg/m   Physical Exam Vitals signs and nursing note reviewed.  Constitutional:      General: She is not in acute distress.    Appearance: She is well-developed. She is not diaphoretic.     Comments: Pale  HENT:     Head: Normocephalic and atraumatic.  Eyes:     Pupils: Pupils are equal, round, and reactive to light.  Neck:     Musculoskeletal: Normal range of motion and neck supple.  Cardiovascular:     Rate and Rhythm: Normal rate and regular rhythm.     Heart sounds: No murmur. No friction rub. No gallop.   Pulmonary:     Effort: Pulmonary effort is normal.     Breath sounds: No wheezing or rales.  Abdominal:     General: There is no distension.     Palpations: Abdomen is soft.     Tenderness: There is no abdominal tenderness.  Musculoskeletal:        General: No tenderness.  Skin:    General: Skin is warm and dry.  Neurological:     Mental Status: She is alert and oriented to person, place, and time.  Psychiatric:        Behavior: Behavior normal.      ED Treatments / Results  Labs (all labs ordered are listed, but only abnormal results are displayed) Labs Reviewed  COMPREHENSIVE METABOLIC PANEL - Abnormal; Notable for the  following components:      Result Value    Glucose, Bld 104 (*)    Calcium 8.8 (*)    Total Protein 5.9 (*)    Albumin 3.3 (*)    All other components within normal limits  CBC - Abnormal; Notable for the following components:   RBC 3.29 (*)    Hemoglobin 8.9 (*)    HCT 29.2 (*)    RDW 15.7 (*)    All other components within normal limits  IRON AND TIBC - Abnormal; Notable for the following components:   Iron 13 (*)    Saturation Ratios 3 (*)    All other components within normal limits  CBC - Abnormal; Notable for the following components:   RBC 2.95 (*)    Hemoglobin 8.0 (*)    HCT 25.8 (*)    RDW 15.9 (*)    All other components within normal limits  HEMOGLOBIN AND HEMATOCRIT, BLOOD - Abnormal; Notable for the following components:   Hemoglobin 7.6 (*)    HCT 24.2 (*)    All other components within normal limits  HEMOGLOBIN AND HEMATOCRIT, BLOOD - Abnormal; Notable for the following components:   Hemoglobin 8.5 (*)    HCT 27.6 (*)    All other components within normal limits  SARS CORONAVIRUS 2  FERRITIN  HIV ANTIBODY (ROUTINE TESTING W REFLEX)  CBC  BASIC METABOLIC PANEL  POC OCCULT BLOOD, ED  TYPE AND SCREEN  ABO/RH  SURGICAL PATHOLOGY    EKG None  Radiology Ct Abdomen Pelvis W Contrast  Result Date: 07/10/2018 CLINICAL DATA:  Abdominal pain, buckled blood in stools. EXAM: CT ABDOMEN AND PELVIS WITH CONTRAST TECHNIQUE: Multidetector CT imaging of the abdomen and pelvis was performed using the standard protocol following bolus administration of intravenous contrast. CONTRAST:  115m OMNIPAQUE IOHEXOL 300 MG/ML  SOLN COMPARISON:  03/07/2018 FINDINGS: Lower chest: Lung bases are clear. No effusions. Heart is normal size. Hepatobiliary: No focal hepatic abnormality. Gallbladder unremarkable. Pancreas: No focal abnormality or ductal dilatation. Spleen: No focal abnormality.  Normal size. Adrenals/Urinary Tract: No adrenal abnormality. No focal renal abnormality. No stones or hydronephrosis. Urinary bladder is  unremarkable. Stomach/Bowel: Prior gastric bypass. No evidence of bowel obstruction. Bowel grossly unremarkable. Vascular/Lymphatic: No evidence of aneurysm or adenopathy. Reproductive: Prior hysterectomy.  No adnexal masses. Other: Small amount of free fluid in the pelvis.  No free air. Musculoskeletal: No acute bony abnormality. IMPRESSION: No acute findings in the abdomen or pelvis. Trace physiologic free fluid in the pelvis. Prior gastric bypass.  No visible complicating feature. Electronically Signed   By: KRolm BaptiseM.D.   On: 07/10/2018 22:22    Procedures Procedures (including critical care time)  Medications Ordered in ED Medications  pantoprazole (PROTONIX) injection 40 mg (40 mg Intravenous Given 07/11/18 2149)  acetaminophen (TYLENOL) tablet 650 mg ( Oral See Alternative 07/11/18 1937)    Or  acetaminophen (TYLENOL) suppository 650 mg (650 mg Rectal Given 07/11/18 1937)  LORazepam (ATIVAN) tablet 1 mg ( Oral MAR Unhold 07/11/18 1429)    Or  LORazepam (ATIVAN) injection 1 mg ( Intravenous MAR Unhold 07/11/18 1429)  thiamine (VITAMIN B-1) tablet 100 mg (100 mg Oral Given 07/11/18 1456)    Or  thiamine (B-1) injection 100 mg ( Intravenous See Alternative 69/83/3812505  folic acid (FOLVITE) tablet 1 mg (1 mg Oral Given 07/11/18 1456)  multivitamin with minerals tablet 1 tablet (1 tablet Oral Given 07/11/18 1456)  0.9 %  sodium chloride infusion (  Intravenous Stopped 07/11/18 1045)  albuterol (PROVENTIL) (2.5 MG/3ML) 0.083% nebulizer solution 3 mL ( Inhalation MAR Unhold 07/11/18 1429)  ondansetron (ZOFRAN) injection 4 mg (4 mg Intravenous Given 07/11/18 1459)  pantoprazole (PROTONIX) injection 40 mg (40 mg Intravenous Given 07/10/18 2152)  iohexol (OMNIPAQUE) 300 MG/ML solution 100 mL (100 mLs Intravenous Contrast Given 07/10/18 2211)     Initial Impression / Assessment and Plan / ED Course  I have reviewed the triage vital signs and the nursing notes.  Pertinent labs & imaging results  that were available during my care of the patient were reviewed by me and considered in my medical decision making (see chart for details).  Clinical Course as of Jul 11 2307  Wed Jul 10, 2018  2103 Hemoglobin(!): 8.9 [AM]    Clinical Course User Index [AM] Albesa Seen, [place]       53 yo F with a chief complaint of dark and tarry stool.  Going on for 3 days.  The patient went to see lobe our GI earlier today and was sent to the ED for admission.  CT scan performed due to recent history of a perforated gastric ulcer negative.  Discussed with hospitalist for admission.  The patients results and plan were reviewed and discussed.   Any x-rays performed were independently reviewed by myself.   Differential diagnosis were considered with the presenting HPI.  Medications  pantoprazole (PROTONIX) injection 40 mg (40 mg Intravenous Given 07/11/18 2149)  acetaminophen (TYLENOL) tablet 650 mg ( Oral See Alternative 07/11/18 1937)    Or  acetaminophen (TYLENOL) suppository 650 mg (650 mg Rectal Given 07/11/18 1937)  LORazepam (ATIVAN) tablet 1 mg ( Oral MAR Unhold 07/11/18 1429)    Or  LORazepam (ATIVAN) injection 1 mg ( Intravenous MAR Unhold 07/11/18 1429)  thiamine (VITAMIN B-1) tablet 100 mg (100 mg Oral Given 07/11/18 1456)    Or  thiamine (B-1) injection 100 mg ( Intravenous See Alternative 5/64/33 2951)  folic acid (FOLVITE) tablet 1 mg (1 mg Oral Given 07/11/18 1456)  multivitamin with minerals tablet 1 tablet (1 tablet Oral Given 07/11/18 1456)  0.9 %  sodium chloride infusion ( Intravenous Stopped 07/11/18 1045)  albuterol (PROVENTIL) (2.5 MG/3ML) 0.083% nebulizer solution 3 mL ( Inhalation MAR Unhold 07/11/18 1429)  ondansetron (ZOFRAN) injection 4 mg (4 mg Intravenous Given 07/11/18 1459)  pantoprazole (PROTONIX) injection 40 mg (40 mg Intravenous Given 07/10/18 2152)  iohexol (OMNIPAQUE) 300 MG/ML solution 100 mL (100 mLs Intravenous Contrast Given 07/10/18 2211)    Vitals:   07/11/18  1925 07/11/18 1944 07/11/18 2029 07/11/18 2119  BP: 122/75     Pulse: 87     Resp: 20     Temp: 100.3 F (37.9 C) (!) 101 F (38.3 C) (!) 101.7 F (38.7 C) (!) 100.4 F (38 C)  TempSrc: Oral  Oral Oral  SpO2: 100%     Weight:      Height:        Final diagnoses:  Melena    Admission/ observation were discussed with the admitting physician, patient and/or family and they are comfortable with the plan.    Final Clinical Impressions(s) / ED Diagnoses   Final diagnoses:  Melena    ED Discharge Orders    None       Deno Etienne, DO 07/11/18 2309

## 2018-07-11 NOTE — Transfer of Care (Signed)
Immediate Anesthesia Transfer of Care Note  Patient: [name]  Procedure(s) Performed: ESOPHAGOGASTRODUODENOSCOPY (EGD) WITH PROPOFOL (N/A ) BIOPSY  Patient Location: Endoscopy Unit  Anesthesia Type:MAC  Level of Consciousness: awake, alert  and oriented  Airway & Oxygen Therapy: Patient Spontanous Breathing and Patient connected to nasal cannula oxygen  Post-op Assessment: Report given to RN and Post -op Vital signs reviewed and stable  Post vital signs: Reviewed and stable  Last Vitals:  Vitals Value Taken Time  BP    Temp    Pulse    Resp    SpO2      Last Pain:  Vitals:   07/11/18 1253  TempSrc: Oral  PainSc: 4          Complications: No apparent anesthesia complications

## 2018-07-11 NOTE — Anesthesia Postprocedure Evaluation (Signed)
Anesthesia Post Note  Patient: [name]  Procedure(s) Performed: ESOPHAGOGASTRODUODENOSCOPY (EGD) WITH PROPOFOL (N/A ) BIOPSY HEMOSTASIS CLIP PLACEMENT     Patient location during evaluation: Endoscopy Anesthesia Type: MAC Level of consciousness: awake and alert Pain management: pain level controlled Vital Signs Assessment: post-procedure vital signs reviewed and stable Respiratory status: spontaneous breathing, nonlabored ventilation and respiratory function stable Cardiovascular status: stable and blood pressure returned to baseline Postop Assessment: no apparent nausea or vomiting Anesthetic complications: no    Last Vitals:  Vitals:   07/11/18 1403 07/11/18 1414  BP: 140/67 (!) 127/94  Pulse: 73 77  Resp: 19 (!) 23  Temp:    SpO2: 92% 93%    Last Pain:  Vitals:   07/11/18 1403  TempSrc:   PainSc: 0-No pain                 Lynda Rainwater

## 2018-07-11 NOTE — Progress Notes (Addendum)
Daily Rounding Note  07/11/2018, 8:23 AM  LOS: 0 days   SUBJECTIVE:   Chief complaint: tarry stools starting on the weekend.  Hx surgical repair anastomotic ulcer in pt s/p R&Y BPG.      Very hungry, last po was 2 PM yesterday.  No nausea since admission, just a bit of mild nausea and non-forceful vomiting a few days ago.  Pain in epigastrium comes and goes.  Tarry stool x 2 yest, none overnight, 1 this AM.   Vital signs stable overnight.    OBJECTIVE:         Vital signs in last 24 hours:    Temp:  [98.6 F (37 C)-98.8 F (37.1 C)] 98.8 F (37.1 C) (06/18 0506) Pulse Rate:  [59-84] 59 (06/18 0506) Resp:  [15-18] 18 (06/18 0506) BP: (102-136)/(68-86) 118/74 (06/18 0506) SpO2:  [92 %-100 %] 100 % (06/18 0506) Weight:  [54.6 kg-56.2 kg] 54.6 kg (06/18 0153) Last BM Date: 07/10/18 Filed Weights   07/11/18 0153  Weight: 54.6 kg   General: looks well but over sun-tanned   Heart: RRR Chest: clear bil.  No labored breathing Abdomen: thin, soft, mild epigastric and left sided tenderness with no guarg or rebound.  Active BS.  Well healed upper mid-line surgical scar  Extremities: no CCE Neuro/Psych:  Oriented x 3.  Alert.  No tremor.    Intake/Output from previous day: 06/17 0701 - 06/18 0700 In: 0  Out: 300 [Urine:300]  Intake/Output this shift: No intake/output data recorded.  Lab Results: Recent Labs    07/10/18 1808 07/11/18 0358 07/11/18 0753  WBC 7.3 4.9  --   HGB 8.9* 8.0* 7.6*  HCT 29.2* 25.8* 24.2*  PLT 287 232  --    BMET Recent Labs    07/10/18 1808  NA 141  K 4.2  CL 108  CO2 27  GLUCOSE 104*  BUN 11  CREATININE 0.60  CALCIUM 8.8*   LFT Recent Labs    07/10/18 1808  PROT 5.9*  ALBUMIN 3.3*  AST 25  ALT 24  ALKPHOS 60  BILITOT 0.4   PT/INR No results for input(s): LABPROT, INR in the last 72 hours. Hepatitis Panel No results for input(s): HEPBSAG, HCVAB, HEPAIGM, HEPBIGM in  the last 72 hours.  Studies/Results: Ct Abdomen Pelvis W Contrast  Result Date: 07/10/2018 CLINICAL DATA:  Abdominal pain, buckled blood in stools. EXAM: CT ABDOMEN AND PELVIS WITH CONTRAST TECHNIQUE: Multidetector CT imaging of the abdomen and pelvis was performed using the standard protocol following bolus administration of intravenous contrast. CONTRAST:  12mL OMNIPAQUE IOHEXOL 300 MG/ML  SOLN COMPARISON:  03/07/2018 FINDINGS: Lower chest: Lung bases are clear. No effusions. Heart is normal size. Hepatobiliary: No focal hepatic abnormality. Gallbladder unremarkable. Pancreas: No focal abnormality or ductal dilatation. Spleen: No focal abnormality.  Normal size. Adrenals/Urinary Tract: No adrenal abnormality. No focal renal abnormality. No stones or hydronephrosis. Urinary bladder is unremarkable. Stomach/Bowel: Prior gastric bypass. No evidence of bowel obstruction. Bowel grossly unremarkable. Vascular/Lymphatic: No evidence of aneurysm or adenopathy. Reproductive: Prior hysterectomy.  No adnexal masses. Other: Small amount of free fluid in the pelvis.  No free air. Musculoskeletal: No acute bony abnormality. IMPRESSION: No acute findings in the abdomen or pelvis. Trace physiologic free fluid in the pelvis. Prior gastric bypass.  No visible complicating feature. Electronically Signed   By: Rolm Baptise M.D.   On: 07/10/2018 22:22    Scheduled Meds: . folic acid  1  mg Oral Daily  . multivitamin with minerals  1 tablet Oral Daily  . pantoprazole (PROTONIX) IV  40 mg Intravenous Q12H  . thiamine  100 mg Oral Daily   Or  . thiamine  100 mg Intravenous Daily   Continuous Infusions: . sodium chloride 125 mL/hr at 07/11/18 0324   PRN Meds:.acetaminophen **OR** acetaminophen, albuterol, LORazepam **OR** LORazepam   ASSESMENT:   *  GIB with tarry stools, epigastric pain 02/2018 ex lap, patch closure of perforated marginal (anastomotic) ulcer.   R&Y gastric bypass 2007 No evidence recurrent perf  on CT.  Protonix 40 IV q12 in place.  At home taking Omeprazole but was not opening/sprinkling capsule prior to taking, thus less absorption of the med.     *   Blood loss anemia, normal MCV.  Hgb 8.9 >> 7.6 overnight.  Was ~ 10 in 02/2018.   Low iron and iron sat, ferritin 15  *  SARS Cov 2 negative.     PLAN   *  In future as outpt, administer Omeprazole 40mg  BID by opening/ sprinkling contents on soft food such as applesauce.    *   EGD ~ 1345 today.  I let her have 8 oz of diet Sprite now, O/w NPO.    *   CBC in AM.  Hgb/crit at 1500.    [name]  07/11/2018, 8:23 AM Phone [phone]     Attending Physician Note   I have taken an interval history, reviewed the chart and examined the patient. I agree with the Advanced Practitioner's note, impression and recommendations. Full consult note by Franz Dell, PAC and S. Havery Moros, MD yesterday in office.    Impression: Epigastric pain and melena - suspected ulcer  ABL and iron deficiency anemia - acute losses, chronic iron malabsorption Roux-en-Y gastric bypass in 2007 S/P patch closure of perforated marginal ulcer in Feb 2020   Recommendations: IV PPI q12h in hospital Capsule PPI opened and take granules PO bid at discharge long term Trend CBC IV iron replacement in hospital EGD today   Lucio Edward, MD [facility] [phone]

## 2018-07-11 NOTE — Anesthesia Preprocedure Evaluation (Signed)
Anesthesia Evaluation  Patient identified by MRN, date of birth, ID band Patient awake    Reviewed: Allergy & Precautions, NPO status , Patient's Chart, lab work & pertinent test results  Airway Mallampati: II  TM Distance: >3 FB Neck ROM: Full    Dental no notable dental hx.    Pulmonary asthma , former smoker,    Pulmonary exam normal breath sounds clear to auscultation       Cardiovascular hypertension, Normal cardiovascular exam Rhythm:Regular Rate:Normal     Neuro/Psych    GI/Hepatic Neg liver ROS, GERD  ,History noted CG   Endo/Other  negative endocrine ROS  Renal/GU negative Renal ROS     Musculoskeletal  (+) Arthritis ,   Abdominal   Peds  Hematology  (+) anemia ,   Anesthesia Other Findings   Reproductive/Obstetrics                             Anesthesia Physical  Anesthesia Plan  ASA: II  Anesthesia Plan: MAC   Post-op Pain Management:    Induction: Intravenous  PONV Risk Score and Plan: 2 and Treatment may vary due to age or medical condition, Ondansetron and Midazolam  Airway Management Planned: Nasal Cannula  Additional Equipment:   Intra-op Plan:   Post-operative Plan:   Informed Consent: I have reviewed the patients History and Physical, chart, labs and discussed the procedure including the risks, benefits and alternatives for the proposed anesthesia with the patient or authorized representative who has indicated his/her understanding and acceptance.     Dental advisory given  Plan Discussed with: CRNA, Anesthesiologist and Surgeon  Anesthesia Plan Comments:         Anesthesia Quick Evaluation

## 2018-07-11 NOTE — Progress Notes (Addendum)
PROGRESS NOTE    [name]  GEX:[id number] DOB: 05-28-1965 DOA: 07/10/2018 PCP: [name], DO   Brief Narrative:  [name] is a 53 y.o. female with medical history significant of gastric bypass with Roux-en-Y gastrojejunostomy in 2007, status post ex lap with repair and patch of a perforated marginal ulcer in February 2020, anemia, anxiety, asthma, depression, GERD being sent to the hospital from GI office for evaluation of melena.  Stool was very dark and heme positive on exam done at GI office.  GI is planning on doing an EGD tomorrow morning here at [facility] with Dr. Fuller Plan.  Patient reports having intermittent epigastric abdominal pain since February.  States the pain has been worse for the past few days.  In addition, reports having dark stools for the past few days.  Denies any NSAID use.  Does report drinking 5-6 beers daily.  Denies any chest pain, palpitations, shortness of breath, or dizziness/lightheadedness. Hemodynamically stable.  Hemoglobin 8.9, was 10.4 on March 10, 2018. CT abdomen pelvis negative for acute finding or visible complicating feature of prior gastric bypass.  Received Protonix 40 mg in the ED.  Assessment & Plan:   Principal Problem:   Melena Active Problems:   GERD (gastroesophageal reflux disease)   Alcohol dependence ([place])   Encounter for screening for HIV   Asthma   Abdominal pain, epigastric   Marginal ulcers  Melena, rule out acute GI bleed History of marginal ulcer.  Continues to be hemodynamically stable.   H/H downtrending - currently 7.6 this morning down from 8.9 at admission - basline 10.5  GI following - pending upper endoscopy **Addendum - two gastric ulcers noted on EGD - s/p clipping per report   GERD -Continue PPI  Alcohol dependence No signs of withdrawal at this time. -CIWA protocol; Ativan as needed -Thiamine, folate, multivitamin  Asthma -Stable.  No bronchospasm.  Albuterol PRN.  DVT prophylaxis: None,  acute GI bleed rule out as above Code Status: Full Disposition Plan: Inpatient, continues to require GI work-up, close monitoring given recent procedure, worsening anemia, possible need for transfusion in the next 6 to 12 hours should hemoglobin continue to trend downwards in its current trajectory   Consultants:   GI  Procedures:   Upper EGD  Subjective: No acute issues or events overnight, declines headache, fever, chills, nausea, vomiting, diarrhea, constipation, shortness of breath, chest pain.  Objective: Vitals:   07/11/18 1253 07/11/18 1353 07/11/18 1403 07/11/18 1414  BP: 125/72 121/63 140/67 (!) 127/94  Pulse: 72 85 73 77  Resp: 15 19 19  (!) 23  Temp: 99.1 F (37.3 C) 97.8 F (36.6 C)    TempSrc: Oral Oral    SpO2: 100% 94% 92% 93%  Weight:      Height:        Intake/Output Summary (Last 24 hours) at 07/11/2018 1436 Last data filed at 07/11/2018 1352 Gross per 24 hour  Intake 1076.28 ml  Output 300 ml  Net 776.28 ml   Filed Weights   07/11/18 0153  Weight: 54.6 kg    Examination:  General:  Pleasantly resting in bed, No acute distress. HEENT:  Normocephalic atraumatic.  Sclerae nonicteric, noninjected.  Extraocular movements intact bilaterally. Neck:  Without mass or deformity.  Trachea is midline. Lungs:  Clear to auscultate bilaterally without rhonchi, wheeze, or rales. Heart:  Regular rate and rhythm.  Without murmurs, rubs, or gallops. Abdomen:  Soft, nontender, nondistended.  Without guarding or rebound. Extremities: Without cyanosis, clubbing, edema, or  obvious deformity. Vascular:  Dorsalis pedis and posterior tibial pulses palpable bilaterally. Skin:  Warm and dry, no erythema, no ulcerations.  Data Reviewed: I have personally reviewed following labs and imaging studies  CBC: Recent Labs  Lab 07/10/18 1808 07/11/18 0358 07/11/18 0753  WBC 7.3 4.9  --   HGB 8.9* 8.0* 7.6*  HCT 29.2* 25.8* 24.2*  MCV 88.8 87.5  --   PLT 287 232  --     Basic Metabolic Panel: Recent Labs  Lab 07/10/18 1808  NA 141  K 4.2  CL 108  CO2 27  GLUCOSE 104*  BUN 11  CREATININE 0.60  CALCIUM 8.8*   GFR: Estimated Creatinine Clearance: 64.3 mL/min (by C-G formula based on SCr of 0.6 mg/dL). Liver Function Tests: Recent Labs  Lab 07/10/18 1808  AST 25  ALT 24  ALKPHOS 60  BILITOT 0.4  PROT 5.9*  ALBUMIN 3.3*   No results for input(s): LIPASE, AMYLASE in the last 168 hours. No results for input(s): AMMONIA in the last 168 hours. Coagulation Profile: No results for input(s): INR, PROTIME in the last 168 hours. Cardiac Enzymes: No results for input(s): CKTOTAL, CKMB, CKMBINDEX, TROPONINI in the last 168 hours. BNP (last 3 results) No results for input(s): PROBNP in the last 8760 hours. HbA1C: No results for input(s): HGBA1C in the last 72 hours. CBG: No results for input(s): GLUCAP in the last 168 hours. Lipid Profile: No results for input(s): CHOL, HDL, LDLCALC, TRIG, CHOLHDL, LDLDIRECT in the last 72 hours. Thyroid Function Tests: No results for input(s): TSH, T4TOTAL, FREET4, T3FREE, THYROIDAB in the last 72 hours. Anemia Panel: Recent Labs    07/11/18 0358  FERRITIN 15  TIBC 426  IRON 13*   Sepsis Labs: No results for input(s): PROCALCITON, LATICACIDVEN in the last 168 hours.  Recent Results (from the past 240 hour(s))  SARS Coronavirus 2     Status: None   Collection Time: 07/10/18 10:57 PM  Result Value Ref Range Status   SARS Coronavirus 2 NOT DETECTED NOT DETECTED Final    Comment: (NOTE) SARS-CoV-2 target nucleic acids are NOT DETECTED. The SARS-CoV-2 RNA is generally detectable in upper and lower respiratory specimens during the acute phase of infection.  Negative  results do not preclude SARS-CoV-2 infection, do not rule out co-infections with other pathogens, and should not be used as the sole basis for treatment or other patient management decisions.  Negative results must be combined with clinical  observations, patient history, and epidemiological information. The expected result is Not Detected. Fact Sheet for Patients: http://www.biofiredefense.com/wp-content/uploads/2020/03/BIOFIRE-COVID -19-patients.pdf Fact Sheet for Healthcare Providers: http://www.biofiredefense.com/wp-content/uploads/2020/03/BIOFIRE-COVID -19-hcp.pdf This test is not yet approved or cleared by the [place] and  has been authorized for detection and/or diagnosis of SARS-CoV-2 by FDA under an Emergency Use Authorization (EUA).  This EUA will remain in effec t (meaning this test can be used) for the duration of  the COVID-19 declaration under Section 564(b)(1) of the Act, 21 U.S.C. section 360bbb-3(b)(1), unless the authorization is terminated or revoked sooner. Performed at [facility], [place] [address], [place], [place] [address]      Radiology Studies: Ct Abdomen Pelvis W Contrast  Result Date: 07/10/2018 CLINICAL DATA:  Abdominal pain, buckled blood in stools. EXAM: CT ABDOMEN AND PELVIS WITH CONTRAST TECHNIQUE: Multidetector CT imaging of the abdomen and pelvis was performed using the standard protocol following bolus administration of intravenous contrast. CONTRAST:  140mL OMNIPAQUE IOHEXOL 300 MG/ML  SOLN COMPARISON:  03/07/2018 FINDINGS: Lower  chest: Lung bases are clear. No effusions. Heart is normal size. Hepatobiliary: No focal hepatic abnormality. Gallbladder unremarkable. Pancreas: No focal abnormality or ductal dilatation. Spleen: No focal abnormality.  Normal size. Adrenals/Urinary Tract: No adrenal abnormality. No focal renal abnormality. No stones or hydronephrosis. Urinary bladder is unremarkable. Stomach/Bowel: Prior gastric bypass. No evidence of bowel obstruction. Bowel grossly unremarkable. Vascular/Lymphatic: No evidence of aneurysm or adenopathy. Reproductive: Prior hysterectomy.  No adnexal masses. Other: Small amount of free fluid in the pelvis.  No free air.  Musculoskeletal: No acute bony abnormality. IMPRESSION: No acute findings in the abdomen or pelvis. Trace physiologic free fluid in the pelvis. Prior gastric bypass.  No visible complicating feature. Electronically Signed   By: Rolm Baptise M.D.   On: 07/10/2018 22:22    Scheduled Meds: . folic acid  1 mg Oral Daily  . multivitamin with minerals  1 tablet Oral Daily  . pantoprazole (PROTONIX) IV  40 mg Intravenous Q12H  . thiamine  100 mg Oral Daily   Or  . thiamine  100 mg Intravenous Daily    LOS: 0 days   Time spent: 35 min  Little Ishikawa, DO [facility]  If 7PM-7AM, please contact night-coverage [email or web address] Password [facility] 07/11/2018, 2:36 PM

## 2018-07-11 NOTE — Progress Notes (Signed)
Agree with assessment and plan as outlined.  

## 2018-07-11 NOTE — Op Note (Addendum)
[facility] Patient Name: [name] Procedure Date : 07/11/2018 MRN: [id number] Attending MD: [name] , MD Date of Birth: March 09, 1965 CSN: [id number] Age: 53 Admit Type: Inpatient Procedure:                Upper GI endoscopy Indications:              Epigastric abdominal pain, Melena Providers:                Pricilla Riffle. Fuller Plan, MD, Jeanella Cara, RN,                            Ladona Ridgel, Technician Referring MD:             [facility] Medicines:                Monitored Anesthesia Care Complications:            No immediate complications. Estimated Blood Loss:     Estimated blood loss was minimal. Procedure:                Pre-Anesthesia Assessment:                           - Prior to the procedure, a History and Physical                            was performed, and patient medications and                            allergies were reviewed. The patient's tolerance of                            previous anesthesia was also reviewed. The risks                            and benefits of the procedure and the sedation                            options and risks were discussed with the patient.                            All questions were answered, and informed consent                            was obtained. Prior Anticoagulants: The patient has                            taken no previous anticoagulant or antiplatelet                            agents. ASA Grade Assessment: II - A patient with                            mild systemic disease. After reviewing the risks  and benefits, the patient was deemed in                            satisfactory condition to undergo the procedure.                           After obtaining informed consent, the endoscope was                            passed under direct vision. Throughout the                            procedure, the patient's blood pressure, pulse, and                      oxygen saturations were monitored continuously. The                            GIF-H190 ([id number]) Olympus gastroscope was                            introduced through the mouth, and advanced to the                            afferent and efferent jejunal loops. The upper GI                            endoscopy was accomplished without difficulty. The                            patient tolerated the procedure well. Scope In: Scope Out: Findings:      The Z-line was variable, 1-2 cm, and was found at the gastroesophageal       junction. Biopsies were taken with a cold forceps for histology.      The exam of the esophagus was otherwise normal.      A small hiatal hernia was present.      Evidence of a Roux-en-Y gastrojejunostomy was found. Pouch measured 5 cm       from EGJ to anastomosis. The gastrojejunal anastomosis was characterized       by friable mucosa. Two clean based ulcers on jejunal side of anastomsis,       one measuring 35mm and one measuring 6 mm. A suture was noted adjacent       to the smaller ulcer. This anastomosis was traversed. Afferent and       efferent limbs examined about 15 cm each and appeared normal. After       withdrawal from the efferent limb there was an area on the gastric side       of the anastomosis with persistent bleeding felt due to contact by the       scope. 2 clips were applied with hemostasis acheived. A 3rd clip did not       engage the tissue and it was removed orally with a forceps. Impression:               - Two clean based marginal ulcers.                           -  Z-line variable, at the gastroesophageal                            junction. Biopsied.                           - Small hiatal hernia.                           - Roux-en-Y gastrojejunostomy with gastrojejunal                            anastomosis characterized by friable mucosa and                            contact bleeding requiring 2 clips for  hemostasis. Recommendation:           - Return patient to hospital ward for ongoing care.                           - Clear liquid diet today.                           - No aspirin, ibuprofen, naproxen, or other                            non-steroidal anti-inflammatory drugs long term.                           - PPI bid IV in hospital and PPI open capsule, take                            granules po bid as outpatient                           - Await pathology results.                           - Repeat upper endoscopy in 2-3 months to check                            ulcer healing per Dr. Havery Moros.                           - Return to GI office in 1 month with Dr.                            Havery Moros. Procedure Code(s):        --- Professional ---                           [phone], Esophagogastroduodenoscopy, flexible,                            transoral; with biopsy, single or multiple Diagnosis Code(s):        --- Professional ---  K22.8, Other specified diseases of esophagus                           K44.9, Diaphragmatic hernia without obstruction or                            gangrene                           Z98.0, Intestinal bypass and anastomosis status                           R10.13, Epigastric pain                           K92.1, Melena (includes Hematochezia) CPT copyright 2019 [employer]. All rights reserved. The codes documented in this report are preliminary and upon coder review may  be revised to meet current compliance requirements. [name], MD 07/11/2018 2:16:09 PM This report has been signed electronically. Number of Addenda: 0

## 2018-07-12 ENCOUNTER — Encounter: Payer: Self-pay | Admitting: Gastroenterology

## 2018-07-12 DIAGNOSIS — D62 Acute posthemorrhagic anemia: Secondary | ICD-10-CM

## 2018-07-12 DIAGNOSIS — K254 Chronic or unspecified gastric ulcer with hemorrhage: Secondary | ICD-10-CM

## 2018-07-12 DIAGNOSIS — K921 Melena: Secondary | ICD-10-CM

## 2018-07-12 LAB — CBC
HCT: 25 % — ABNORMAL LOW (ref 36.0–46.0)
Hemoglobin: 7.9 g/dL — ABNORMAL LOW (ref 12.0–15.0)
MCH: 27.6 pg (ref 26.0–34.0)
MCHC: 31.6 g/dL (ref 30.0–36.0)
MCV: 87.4 fL (ref 80.0–100.0)
Platelets: 259 10*3/uL (ref 150–400)
RBC: 2.86 MIL/uL — ABNORMAL LOW (ref 3.87–5.11)
RDW: 16 % — ABNORMAL HIGH (ref 11.5–15.5)
WBC: 10.6 10*3/uL — ABNORMAL HIGH (ref 4.0–10.5)
nRBC: 0 % (ref 0.0–0.2)

## 2018-07-12 LAB — BASIC METABOLIC PANEL
Anion gap: 6 (ref 5–15)
BUN: 5 mg/dL — ABNORMAL LOW (ref 6–20)
CO2: 28 mmol/L (ref 22–32)
Calcium: 8.2 mg/dL — ABNORMAL LOW (ref 8.9–10.3)
Chloride: 107 mmol/L (ref 98–111)
Creatinine, Ser: 0.66 mg/dL (ref 0.44–1.00)
GFR calc Af Amer: >60 mL/min (ref >60)
GFR calc non Af Amer: >60 mL/min (ref >60)
Glucose, Bld: 112 mg/dL — ABNORMAL HIGH (ref 70–99)
Potassium: 3.5 mmol/L (ref 3.5–5.1)
Sodium: 141 mmol/L (ref 135–145)

## 2018-07-12 MED ORDER — SODIUM CHLORIDE 0.9 % IV SOLN
510.0000 mg | Freq: Once | INTRAVENOUS | Status: AC
  Administered 2018-07-12: 510 mg via INTRAVENOUS
  Filled 2018-07-12: qty 17

## 2018-07-12 NOTE — Discharge Summary (Signed)
Physician Discharge Summary  [name] OMV:[id number] DOB: [birth year] DOA: 07/10/2018  PCP: [name], DO  Admit date: 07/10/2018 Discharge date: 07/12/2018  Admitted From: Home Disposition:  Home  Recommendations for Outpatient Follow-up:  1. Follow up with PCP in 1-2 weeks 2. Please obtain BMP/CBC in [name] week  Home Health: Mary  Equipment/Devices: None  Discharge Condition: Stable  CODE STATUS: Full  Diet recommendation: As tolerated   Brief/Interim Summary: [name] a 53 y.o.femalewith medical history significant ofgastric bypass with Roux-en-Y gastrojejunostomy in 2007, status post ex lap with repair and patch of a perforated marginal ulcer in February 2020, anemia, anxiety, asthma, depression, GERD being sent to the hospital from GI office for evaluation of melena. Stool was very dark and heme positive on exam done at GI office. GI is planning on doing an EGD tomorrow morning here at [facility] with Dr. Fuller Plan.Patient reports having intermittent epigastric abdominal pain since February. States the pain has been worse for the past few days. In addition, reports having dark stools for the past few days. Denies any NSAID use. Does report drinking 5-6 beers daily. Denies any chest pain, palpitations, shortness of breath, or dizziness/lightheadedness. Hemodynamically stable. Hemoglobin 8.9, was 10.4 on March 10, 2018. CT abdomen pelvis negative for acute finding or visible complicating feature of prior gastric bypass.Received Protonix 40 mg in the ED.  Patient admitted as above for acute symptomatic anemia s/p clipping with GI via endoscopy for anastomotic ulcers.  At this time patient's hemoglobin is quite stable, patient tolerating p.o. well, Mary ongoing issues of melena, hematochezia, hematemesis.  Patient otherwise stable and agreeable for discharge, follow-up with PCP in the next 3 to 5 days for repeat evaluation and labs as indicated.  Patient to follow-up  with GI in the outpatient setting for ongoing evaluation and treatment as well as likely initiation of IV iron per discussion here.  Discharge Diagnoses:  Principal Problem:   Melena Active Problems:   GERD (gastroesophageal reflux disease)   Alcohol dependence ([place])   Encounter for screening for HIV   Asthma   Abdominal pain, epigastric   Marginal ulcers  Melena, 2/2 acute GI bleed, s/p clipping - stable Two gastric ulcers noted on EGD - s/p clipping per report Patient stable, hemoglobin flat - follow outpatient  Transient fever Patient had fever x1 overnight at 101 - unclear if 2/2 anesthesia/procedure or underlying infection - patient declines any symptoms other than "chills" overnight. Hold off on antibiotics at this time.  GERD Continue PPI  Alcohol dependence Mary signs of withdrawal at this time. CIWA protocol not utilized Recommend multivitamin at DC and alcohol cessation  Asthma Stable. Mary bronchospasm. Albuterol PRN.  Discharge Instructions  Please see PCP within 3 to 5 days of discharge for repeat CBC and further workup.   Allergies as of 07/12/2018      Reactions   Penicillins Other (See Comments)   Did it involve swelling of the face/tongue/throat, SOB, or low BP? Unk Did it involve sudden or severe rash/hives, skin peeling, or any reaction on the inside of your mouth or nose? Unk Did you need to seek medical attention at a hospital or doctor's office? Unk When did it last happen? Was 53 years old; reaction not recalled If all above answers are "Mary", may proceed with cephalosporin use.   Codeine Itching      Medication List    TAKE these medications   Align Chew Chew 2 tablets by mouth daily.   ALPRAZolam 1  MG tablet Commonly known as: XANAX Take 0.25-1 mg by mouth as needed (before procedures or anxiety).   Anti-Diarrheal 2 MG capsule Generic drug: loperamide Take 2 mg by mouth 3 (three) times daily.   Cyanocobalamin 1000 MCG/ML  Kit Inject 1,000 mcg as directed every 30 (thirty) days.   cyclobenzaprine 10 MG tablet Commonly known as: FLEXERIL Take 1 tablet (10 mg total) by mouth 3 (three) times daily as needed. What changed:   when to take this  reasons to take this   dextroamphetamine 10 MG tablet Commonly known as: DEXTROSTAT Take 20 mg by mouth 3 (three) times daily.   fluticasone 50 MCG/ACT nasal spray Commonly known as: FLONASE Place 2 sprays into both nostrils daily. What changed:   when to take this  reasons to take this   omeprazole 40 MG capsule Commonly known as: PRILOSEC Take 1 capsule (40 mg total) by mouth 2 (two) times a day. Open capsule and sprinkle on applesauce. What changed: Another medication with the same name was removed. Continue taking this medication, and follow the directions you see here.   rOPINIRole 1 MG tablet Commonly known as: REQUIP TAKE 1 TABLET (1 MG TOTAL) BY MOUTH AT BEDTIME. What changed:   how much to take  how to take this  when to take this  additional instructions   SYRINGE-NEEDLE (DISP) 3 ML 25G X 5/8" 3 ML Misc Commonly known as: B-D 3CC LUER-LOK SYR 25GX5/8" USE AS DIRECTED WITH B-12 INJECTION   trimethoprim 100 MG tablet Commonly known as: TRIMPEX Take 1 tablet (100 mg total) by mouth daily. What changed:   when to take this  reasons to take this   Viibryd 40 MG Tabs Generic drug: Vilazodone HCl Take 40 mg by mouth daily. In the morning   Viibryd 20 MG Tabs Generic drug: Vilazodone HCl Take 1 tablet by mouth daily. At lunch   Vitamin D (Ergocalciferol) 1.25 MG (50000 UT) Caps capsule Commonly known as: DRISDOL Take 1 capsule (50,000 Units total) by mouth every 7 (seven) days.   Vitamin D3 50 MCG (2000 UT) capsule Take 1 capsule (2,000 Units total) by mouth daily.      Follow-up Information    Armbruster, Carlota Raspberry, MD Follow up.   Specialty: Gastroenterology Why: his office should contact you with follow up appt once  schedule is released.  if you do not hear from GI office, please reach out and call them Contact information: [place] [address] [phone]        Lowne Chase, [place], DO On 07/15/2018.   Specialty: Family Medicine Why: '@11' :30am with virtual visit,the office will text you a link and you will click on the link Contact information: [address] [place] [address] [phone]          Allergies  Allergen Reactions  . Penicillins Other (See Comments)    Did it involve swelling of the face/tongue/throat, SOB, or low BP? Unk Did it involve sudden or severe rash/hives, skin peeling, or any reaction on the inside of your mouth or nose? Unk Did you need to seek medical attention at a hospital or doctor's office? Unk When did it last happen? Was 54 years old; reaction not recalled If all above answers are "Mary", may proceed with cephalosporin use.   . Codeine Itching    Consultations:  GI [place]   Procedures/Studies: Ct Abdomen Pelvis W Contrast  Result Date: 07/10/2018 CLINICAL DATA:  Abdominal pain, buckled blood in stools. EXAM: CT ABDOMEN AND PELVIS WITH CONTRAST TECHNIQUE: Multidetector CT imaging of the abdomen and pelvis was performed using the standard protocol following bolus administration of intravenous contrast. CONTRAST:  116m OMNIPAQUE IOHEXOL 300 MG/ML  SOLN COMPARISON:  03/07/2018 FINDINGS: Lower chest: Lung bases are clear. Mary effusions. Heart is normal size. Hepatobiliary: Mary focal hepatic abnormality. Gallbladder unremarkable. Pancreas: Mary focal abnormality or ductal dilatation. Spleen: Mary focal abnormality.  Normal size. Adrenals/Urinary Tract: Mary adrenal abnormality. Mary focal renal abnormality. Mary stones or hydronephrosis. Urinary bladder is unremarkable. Stomach/Bowel: Prior gastric bypass. Mary evidence of bowel obstruction. Bowel grossly unremarkable. Vascular/Lymphatic: Mary evidence of aneurysm or adenopathy.  Reproductive: Prior hysterectomy.  Mary adnexal masses. Other: Small amount of free fluid in the pelvis.  Mary free air. Musculoskeletal: Mary acute bony abnormality. IMPRESSION: Mary acute findings in the abdomen or pelvis. Trace physiologic free fluid in the pelvis. Prior gastric bypass.  Mary visible complicating feature. Electronically Signed   By: KRolm BaptiseM.D.   On: 07/10/2018 22:22   Dg UDuanne LimerickW Single Cm (sol Or Thin Ba)  Result Date: 06/26/2018 CLINICAL DATA:  53year old female with a history of prior gastric bypass presented with perforated ulcer at the [place] in February which was treated surgically. New abdominal discomfort since that time, belching. EXAM: UPPER GI SERIES WITH KUB TECHNIQUE: After obtaining a scout radiograph a routine upper GI series was performed using barium FLUOROSCOPY TIME:  Fluoroscopy Time:  3 minutes 18 seconds Radiation Exposure Index (if provided by the fluoroscopic device): 159 Number of Acquired Spot Images: 0 [facility]  [facility] and earlier. FINDINGS: Preprocedural scout view of the abdomen demonstrates multiple staple lines in the left upper quadrant. Normal bowel gas pattern. Negative visible osseous structures and lung bases. The exam proceeded with p.o. thin barium contrast which the patient tolerated well and without difficulty. Mary obstruction to the forward flow of contrast throughout the esophagus and into the stomach. Normal visible esophageal course and contour. During the exam a small volume of oral contrast was maintained in the esophagus with to and fro motion (series 8 and 9). Mild tertiary contractions occurred. The patient was asymptomatic. Normal gastroesophageal junction. The residual stomach appears unremarkable. There was immediate passage of contrast through the gastrojejunostomy and into proximal small bowel (series 2). Good distension of the gastric pouch and anastomosis loop. Images obtained  in multiple obliquities demonstrate Mary contour abnormality or the evidence of ulceration. By the end of the exam numerous small bowel loops were opacified through the lower abdomen and appear unremarkable. Mary gastroesophageal reflux occurred spontaneously or was elicited. IMPRESSION: 1. Satisfactory appearance of gastrojejunostomy. 2. Mildly decreased esophageal motility, which seemed to be asymptomatic. 3.  Mary gastroesophageal reflux occurred or was elicited. Electronically Signed   By: HGenevie AnnM.D.   On: 06/26/2018 09:56    Subjective: Fever overnight w/ associated chills.  Otherwise declines nausea, vomiting, shortness of breath, chest pain, cough, sputum production, urinary symptoms, diarrhea, constipation, headache.   Discharge Exam: Vitals:   07/12/18 0427 07/12/18 0749  BP: 112/74 118/68  Pulse: 74 77  Resp: 20 20  Temp: 98.5 F (36.9 C) 98.7 F (37.1 C)  SpO2: 98% 99%   Vitals:   07/12/18 0134 07/12/18 0348 07/12/18 0427 07/12/18 0749  BP: 108/67  112/74 118/68  Pulse: 67  74 77  Resp: '20  20 20  ' Temp: 98.6 F (37 C)  98.5 F (  36.9 C) 98.7 F (37.1 C)  TempSrc: Oral  Oral Oral  SpO2: 96%  98% 99%  Weight:  54.8 kg    Height:        General:  Pleasantly resting in bed, Mary acute distress. HEENT:  Normocephalic atraumatic.  Sclerae nonicteric, noninjected.  Extraocular movements intact bilaterally. Neck:  Without mass or deformity.  Trachea is midline. Lungs:  Clear to auscultate bilaterally without rhonchi, wheeze, or rales. Heart:  Regular rate and rhythm.  Without murmurs, rubs, or gallops. Abdomen:  Soft, nontender, nondistended.  Without guarding or rebound. Extremities: Without cyanosis, clubbing, edema, or obvious deformity. Vascular:  Dorsalis pedis and posterior tibial pulses palpable bilaterally. Skin:  Warm and dry, Mary erythema, Mary ulcerations.  The results of significant diagnostics from this hospitalization (including imaging, microbiology, ancillary and  laboratory) are listed below for reference.    Microbiology: Recent Results (from the past 240 hour(s))  SARS Coronavirus 2     Status: None   Collection Time: 07/10/18 10:57 PM  Result Value Ref Range Status   SARS Coronavirus 2 NOT DETECTED NOT DETECTED Final    Comment: (NOTE) SARS-CoV-2 target nucleic acids are NOT DETECTED. The SARS-CoV-2 RNA is generally detectable in upper and lower respiratory specimens during the acute phase of infection.  Negative  results do not preclude SARS-CoV-2 infection, do not rule out co-infections with other pathogens, and should not be used as the sole basis for treatment or other patient management decisions.  Negative results must be combined with clinical observations, patient history, and epidemiological information. The expected result is Not Detected. Fact Sheet for Patients: http://www.biofiredefense.com/wp-content/uploads/2020/03/BIOFIRE-COVID -19-patients.pdf Fact Sheet for Healthcare Providers: http://www.biofiredefense.com/wp-content/uploads/2020/03/BIOFIRE-COVID -19-hcp.pdf This test is not yet approved or cleared by the [place] and  has been authorized for detection and/or diagnosis of SARS-CoV-2 by FDA under an Emergency Use Authorization (EUA).  This EUA will remain in effec t (meaning this test can be used) for the duration of  the COVID-19 declaration under Section 564(b)(1) of the Act, 21 U.S.C. section 360bbb-3(b)(1), unless the authorization is terminated or revoked sooner. Performed at [facility], [place] [address], [place], [place] [address]      Labs: BNP (last 3 results) Mary results for input(s): BNP in the last 8760 hours. Basic Metabolic Panel: Recent Labs  Lab 07/10/18 1808 07/12/18 0558  NA 141 141  K 4.2 3.5  CL 108 107  CO2 27 28  GLUCOSE 104* 112*  BUN 11 5*  CREATININE 0.60 0.66  CALCIUM 8.8* 8.2*   Liver Function Tests: Recent Labs  Lab 07/10/18 1808  AST 25  ALT 24  ALKPHOS  60  BILITOT 0.4  PROT 5.9*  ALBUMIN 3.3*   Mary results for input(s): LIPASE, AMYLASE in the last 168 hours. Mary results for input(s): AMMONIA in the last 168 hours. CBC: Recent Labs  Lab 07/10/18 1808 07/11/18 0358 07/11/18 0753 07/11/18 1530 07/12/18 0558  WBC 7.3 4.9  --   --  10.6*  HGB 8.9* 8.0* 7.6* 8.5* 7.9*  HCT 29.2* 25.8* 24.2* 27.6* 25.0*  MCV 88.8 87.5  --   --  87.4  PLT 287 232  --   --  259   Cardiac Enzymes: Mary results for input(s): CKTOTAL, CKMB, CKMBINDEX, TROPONINI in the last 168 hours. BNP: Invalid input(s): POCBNP CBG: Mary results for input(s): GLUCAP in the last 168 hours. D-Dimer Mary results for input(s): DDIMER in the last 72 hours. Hgb A1c Mary results for input(s): HGBA1C  in the last 72 hours. Lipid Profile Mary results for input(s): CHOL, HDL, LDLCALC, TRIG, CHOLHDL, LDLDIRECT in the last 72 hours. Thyroid function studies Mary results for input(s): TSH, T4TOTAL, T3FREE, THYROIDAB in the last 72 hours.  Invalid input(s): FREET3 Anemia work up Recent Labs    07/11/18 0358  FERRITIN 15  TIBC 426  IRON 13*   Urinalysis    Component Value Date/Time   COLORURINE YELLOW 08/01/2016 1625   APPEARANCEUR Sl Cloudy (A) 08/01/2016 1625   LABSPEC 1.010 08/01/2016 1625   PHURINE 6.5 08/01/2016 1625   GLUCOSEU NEGATIVE 08/01/2016 1625   HGBUR NEGATIVE 08/01/2016 1625   HGBUR negative 01/07/2010 0814   BILIRUBINUR neg 10/02/2016 1257   KETONESUR NEGATIVE 08/01/2016 1625   PROTEINUR neg 10/02/2016 1257   UROBILINOGEN 0.2 10/02/2016 1257   UROBILINOGEN 0.2 08/01/2016 1625   NITRITE neg 10/02/2016 1257   NITRITE POSITIVE (A) 08/01/2016 1625   LEUKOCYTESUR Negative 10/02/2016 1257   Sepsis Labs Invalid input(s): PROCALCITONIN,  WBC,  LACTICIDVEN Microbiology Recent Results (from the past 240 hour(s))  SARS Coronavirus 2     Status: None   Collection Time: 07/10/18 10:57 PM  Result Value Ref Range Status   SARS Coronavirus 2 NOT DETECTED NOT DETECTED  Final    Comment: (NOTE) SARS-CoV-2 target nucleic acids are NOT DETECTED. The SARS-CoV-2 RNA is generally detectable in upper and lower respiratory specimens during the acute phase of infection.  Negative  results do not preclude SARS-CoV-2 infection, do not rule out co-infections with other pathogens, and should not be used as the sole basis for treatment or other patient management decisions.  Negative results must be combined with clinical observations, patient history, and epidemiological information. The expected result is Not Detected. Fact Sheet for Patients: http://www.biofiredefense.com/wp-content/uploads/2020/03/BIOFIRE-COVID -19-patients.pdf Fact Sheet for Healthcare Providers: http://www.biofiredefense.com/wp-content/uploads/2020/03/BIOFIRE-COVID -19-hcp.pdf This test is not yet approved or cleared by the [place] and  has been authorized for detection and/or diagnosis of SARS-CoV-2 by FDA under an Emergency Use Authorization (EUA).  This EUA will remain in effec t (meaning this test can be used) for the duration of  the COVID-19 declaration under Section 564(b)(1) of the Act, 21 U.S.C. section 360bbb-3(b)(1), unless the authorization is terminated or revoked sooner. Performed at [facility], [place] [address] [place], [place] [address]    Time coordinating discharge: Over 30 minutes  SIGNED:  Little Ishikawa, DO [facility] 07/12/2018, 12:08 PM Pager   If 7PM-7AM, please contact night-coverage [email or web address] Password [email or web address]

## 2018-07-12 NOTE — Progress Notes (Addendum)
Daily Rounding Note  07/12/2018, 8:18 AM  LOS: 1 day   SUBJECTIVE:   Chief complaint: GIB.  Anastomotic ulcers.  Blood loss anemia   No pain.  Last BM was pre EGD yesterday AM.  Feels well.  Tolerating solid food and eating well.    OBJECTIVE:         Vital signs in last 24 hours:    Temp:  [97.8 F (36.6 C)-101.7 F (38.7 C)] 98.7 F (37.1 C) (06/19 0749) Pulse Rate:  [62-87] 77 (06/19 0749) Resp:  [15-23] 20 (06/19 0749) BP: (108-145)/(63-94) 118/68 (06/19 0749) SpO2:  [92 %-100 %] 99 % (06/19 0749) Weight:  [54.8 kg] 54.8 kg (06/19 0348) Last BM Date: 07/10/18 Filed Weights   07/11/18 0153 07/12/18 0348  Weight: 54.6 kg 54.8 kg   General: looks well.  Comfortable.  NAD   Heart: RRR Chest: clear bil.   Abdomen: NT, soft, ND, active BS  Extremities: no CCE Neuro/Psych:  Oriented x 3.  No deficits, tremors, weakness.    Intake/Output from previous day: 06/18 0701 - 06/19 0700 In: 1796.3 [P.O.:720; I.V.:1076.3] Out: 900 [Urine:900]  Intake/Output this shift: No intake/output data recorded.  Lab Results: Recent Labs    07/10/18 1808 07/11/18 0358 07/11/18 0753 07/11/18 1530 07/12/18 0558  WBC 7.3 4.9  --   --  10.6*  HGB 8.9* 8.0* 7.6* 8.5* 7.9*  HCT 29.2* 25.8* 24.2* 27.6* 25.0*  PLT 287 232  --   --  259   BMET Recent Labs    07/10/18 1808 07/12/18 0558  NA 141 141  K 4.2 3.5  CL 108 107  CO2 27 28  GLUCOSE 104* 112*  BUN 11 5*  CREATININE 0.60 0.66  CALCIUM 8.8* 8.2*   LFT Recent Labs    07/10/18 1808  PROT 5.9*  ALBUMIN 3.3*  AST 25  ALT 24  ALKPHOS 60  BILITOT 0.4   PT/INR No results for input(s): LABPROT, INR in the last 72 hours. Hepatitis Panel No results for input(s): HEPBSAG, HCVAB, HEPAIGM, HEPBIGM in the last 72 hours.  Studies/Results: Ct Abdomen Pelvis W Contrast  Result Date: 07/10/2018 CLINICAL DATA:  Abdominal pain, buckled blood in stools. EXAM: CT  ABDOMEN AND PELVIS WITH CONTRAST TECHNIQUE: Multidetector CT imaging of the abdomen and pelvis was performed using the standard protocol following bolus administration of intravenous contrast. CONTRAST:  167mL OMNIPAQUE IOHEXOL 300 MG/ML  SOLN COMPARISON:  03/07/2018 FINDINGS: Lower chest: Lung bases are clear. No effusions. Heart is normal size. Hepatobiliary: No focal hepatic abnormality. Gallbladder unremarkable. Pancreas: No focal abnormality or ductal dilatation. Spleen: No focal abnormality.  Normal size. Adrenals/Urinary Tract: No adrenal abnormality. No focal renal abnormality. No stones or hydronephrosis. Urinary bladder is unremarkable. Stomach/Bowel: Prior gastric bypass. No evidence of bowel obstruction. Bowel grossly unremarkable. Vascular/Lymphatic: No evidence of aneurysm or adenopathy. Reproductive: Prior hysterectomy.  No adnexal masses. Other: Small amount of free fluid in the pelvis.  No free air. Musculoskeletal: No acute bony abnormality. IMPRESSION: No acute findings in the abdomen or pelvis. Trace physiologic free fluid in the pelvis. Prior gastric bypass.  No visible complicating feature. Electronically Signed   By: Rolm Baptise M.D.   On: 07/10/2018 22:22    ASSESMENT:   *    GIB with melena 07/12/18 EGD: 2 clean-based marginal ulcers, small HH, biopsy of variable Z line.  R& Y gastrojejunostomy with friable mucosa, contact bleeding treated with 2 endoclips.    *  Blood loss anemia.  No transfusion to date.  Low iron and iron sat, ferritin very low normal range  *   Oversew of perfd marginal ulcer in 01/2018     PLAN   *   BID PPI.  IV as inpt.  As outpt Omeprazole 40 mg, open capsule and sprinkle contents onto applesauce or similar textured food.   *   Will need fup EGD in 8 to 12 weeks to assess healing of ulcers.  Dr Havery Moros.    *   Oral Iron Sulfate 325 mg daily but has hx of intolerance so ok if only takes it a few times a week.  Infusion Feraheme ordered    *    Recheck Hgb/hct next week.  OK for discharge today, likely needs next week off from work which involves heavy lifting.         Azucena Freed  07/12/2018, 8:18 AM Phone [phone]

## 2018-07-15 ENCOUNTER — Ambulatory Visit (INDEPENDENT_AMBULATORY_CARE_PROVIDER_SITE_OTHER): Payer: BC Managed Care – PPO | Admitting: Family Medicine

## 2018-07-15 ENCOUNTER — Encounter: Payer: Self-pay | Admitting: Family Medicine

## 2018-07-15 ENCOUNTER — Other Ambulatory Visit: Payer: Self-pay

## 2018-07-15 ENCOUNTER — Telehealth: Payer: Self-pay

## 2018-07-15 VITALS — Ht 62.0 in | Wt 120.0 lb

## 2018-07-15 DIAGNOSIS — E538 Deficiency of other specified B group vitamins: Secondary | ICD-10-CM

## 2018-07-15 DIAGNOSIS — R5383 Other fatigue: Secondary | ICD-10-CM | POA: Diagnosis not present

## 2018-07-15 DIAGNOSIS — K921 Melena: Secondary | ICD-10-CM

## 2018-07-15 MED ORDER — CYANOCOBALAMIN 1000 MCG/ML IJ KIT: 1000.0000 ug | PACK | INTRAMUSCULAR | 0 refills | Status: DC

## 2018-07-15 MED ORDER — "BD LUER-LOK SYRINGE 25G X 5/8"" 3 ML MISC": 3 refills | Status: DC

## 2018-07-15 NOTE — Telephone Encounter (Signed)
-----   Message from Yetta Flock, MD sent at 07/15/2018 12:19 PM EDT ----- Thanks Lavada Mesi can you help make a follow up appointment with me or any of the PAs in July? Thanks  ----- Message ----- From: [name] Sent: 07/12/2018   9:08 AM EDT To: [name], MD, Yetta Flock, MD, #  Hi.  I was unable to make a fup appt with Dr Loni Muse or Amy for July. Once schedule opens up, please make fup appt for pt and notify pt.  1 month or so would be ideal timing.   Reason is fup bleeding ulcers and anemia.   Eventually needs relook EGD in 8 to 12 weeks  Thanks, Judson Roch.

## 2018-07-15 NOTE — Progress Notes (Signed)
Virtual Visit via Video Note  I connected with [name] on 07/15/18 at 10:00 AM EDT by a video enabled telemedicine application and verified that I am speaking with the correct person using two identifiers.  Location: Patient: home  Provider: home    I discussed the limitations of evaluation and management by telemedicine and the availability of in person appointments. The patient expressed understanding and agreed to proceed.  History of Present Illness: Pt is home from hosp since 6/19.   Admitted 6/17 from GI office after having dark stools and heme + stool.  She had abd crampling as well .  Pantoprazole was changed to prevacid.     Past Medical History:  Diagnosis Date  . Allergy   . Anemia   . Anxiety   . Asthma   . Depression   . GERD (gastroesophageal reflux disease)   . Heart murmur    Current Outpatient Medications on File Prior to Visit  Medication Sig Dispense Refill  . ALPRAZolam (XANAX) 1 MG tablet Take 0.25-1 mg by mouth as needed (before procedures or anxiety).     . Cholecalciferol (VITAMIN D3) 2000 units capsule Take 1 capsule (2,000 Units total) by mouth daily. 30 capsule 1  . cyclobenzaprine (FLEXERIL) 10 MG tablet Take 1 tablet (10 mg total) by mouth 3 (three) times daily as needed. (Patient taking differently: Take 10 mg by mouth 2 (two) times daily as needed for muscle spasms. ) 30 tablet 0  . dextroamphetamine (DEXTROSTAT) 10 MG tablet Take 20 mg by mouth 3 (three) times daily.     . fluticasone (FLONASE) 50 MCG/ACT nasal spray Place 2 sprays into both nostrils daily. (Patient taking differently: Place 2 sprays into both nostrils daily as needed for allergies or rhinitis. ) 16 g 5  . loperamide (ANTI-DIARRHEAL) 2 MG capsule Take 2 mg by mouth 3 (three) times daily.    Marland Kitchen omeprazole (PRILOSEC) 40 MG capsule Take 1 capsule (40 mg total) by mouth 2 (two) times a day. Open capsule and sprinkle on applesauce. 60 capsule 11  . Probiotic Product (ALIGN) CHEW Chew 2  tablets by mouth daily.    Marland Kitchen rOPINIRole (REQUIP) 1 MG tablet TAKE 1 TABLET (1 MG TOTAL) BY MOUTH AT BEDTIME. (Patient taking differently: Take 1 mg by mouth at bedtime. ) 90 tablet 3  . trimethoprim (TRIMPEX) 100 MG tablet Take 1 tablet (100 mg total) by mouth daily. (Patient taking differently: Take 100 mg by mouth daily as needed ("as directed, for bladder infections"). ) 90 tablet 1  . Vilazodone HCl (VIIBRYD) 20 MG TABS Take 1 tablet by mouth daily. At lunch    . Vilazodone HCl (VIIBRYD) 40 MG TABS Take 40 mg by mouth daily. In the morning    . [DISCONTINUED] Azelastine-Fluticasone (DYMISTA) 137-50 MCG/ACT SUSP Place 1 spray into the nose 2 (two) times daily.    . [DISCONTINUED] loratadine (CLARITIN) 10 MG tablet Take 10 mg by mouth daily.      . [DISCONTINUED] montelukast (SINGULAIR) 10 MG tablet Take 10 mg by mouth at bedtime.      . Vitamin D, Ergocalciferol, (DRISDOL) 50000 units CAPS capsule Take 1 capsule (50,000 Units total) by mouth every 7 (seven) days. (Patient not taking: Reported on 07/11/2018) 4 capsule 2  . [DISCONTINUED] RABEprazole (ACIPHEX) 20 MG tablet Take 20 mg by mouth daily.      . [DISCONTINUED] traZODone (DESYREL) 100 MG tablet Take 100 mg by mouth at bedtime.       No  current facility-administered medications on file prior to visit.     Observations/Objective: Today's Vitals   07/15/18 0932  Weight: 120 lb (54.4 kg)  Height: '5\' 2"'  (1.575 m)   Body mass index is 21.95 kg/m.  Pt is fatigued  No fevers    Assessment and Plan: 1. B12 deficiency Refill meds  Check labs  - Cyanocobalamin 1000 MCG/ML KIT; Inject 1,000 mcg as directed every 30 (thirty) days.  Dispense: 1 kit; Refill: 0 - SYRINGE-NEEDLE, DISP, 3 ML (B-D 3CC LUER-LOK SYR 25GX5/8") 25G X 5/8" 3 ML MISC; USE AS DIRECTED WITH B-12 INJECTION  Dispense: 16 each; Refill: 3 - Basic metabolic panel; Future - IBC + Ferritin; Future  2. Melena Check labs  - Basic metabolic panel; Future - CBC with  Differential/Platelet; Future - IBC + Ferritin; Future  3. Fatigue, unspecified type Check labs   - POCT Urinalysis Dipstick (Automated); Future - IBC + Ferritin; Future  hosp d/c summary , labs reviewed  Pt will f/u with GI for IV iron  Follow Up Instructions:    I discussed the assessment and treatment plan with the patient. The patient was provided an opportunity to ask questions and all were answered. The patient agreed with the plan and demonstrated an understanding of the instructions.   The patient was advised to call back or seek an in-person evaluation if the symptoms worsen or if the condition fails to improve as anticipated.  I provided 25 minutes of non-face-to-face time during this encounter.   [name], DO

## 2018-07-15 NOTE — Telephone Encounter (Signed)
Patient scheduled for In Person visit with Tye Savoy NP 07/25/18 at 1:30pm. Patient called and in agreement with visit

## 2018-07-18 ENCOUNTER — Other Ambulatory Visit (INDEPENDENT_AMBULATORY_CARE_PROVIDER_SITE_OTHER): Payer: BC Managed Care – PPO

## 2018-07-18 ENCOUNTER — Other Ambulatory Visit: Payer: Self-pay

## 2018-07-18 ENCOUNTER — Telehealth: Payer: Self-pay | Admitting: Family Medicine

## 2018-07-18 DIAGNOSIS — E538 Deficiency of other specified B group vitamins: Secondary | ICD-10-CM | POA: Diagnosis not present

## 2018-07-18 DIAGNOSIS — R5383 Other fatigue: Secondary | ICD-10-CM

## 2018-07-18 DIAGNOSIS — K921 Melena: Secondary | ICD-10-CM | POA: Diagnosis not present

## 2018-07-18 DIAGNOSIS — R82998 Other abnormal findings in urine: Secondary | ICD-10-CM

## 2018-07-18 LAB — POC URINALSYSI DIPSTICK (AUTOMATED)
Bilirubin, UA: NEGATIVE
Blood, UA: NEGATIVE
Glucose, UA: NEGATIVE
Ketones, UA: NEGATIVE
Nitrite, UA: NEGATIVE
Protein, UA: NEGATIVE
Spec Grav, UA: 1.01 (ref 1.010–1.025)
Urobilinogen, UA: 0.2 E.U./dL
pH, UA: 5.5 (ref 5.0–8.0)

## 2018-07-18 NOTE — Telephone Encounter (Signed)
Pt came in the office for lab work and wanted to know if Provider filled out her FMLA paperwork since document was filled out but not completely and pt had informed provider about the FMLA not completed and wanted to see provider had a chance to file out the part that was needed to be complete and faxed to where it needed to go. (pt stated were document needed to be filled out by provider during her appt on 07-15-2018) Please advise pt if document was completed and signed off by provider. Pt tel [phone]. ( pt would like copy of completed document to be mailed to her also to home address)

## 2018-07-19 ENCOUNTER — Telehealth: Payer: Self-pay | Admitting: *Deleted

## 2018-07-19 DIAGNOSIS — R5383 Other fatigue: Secondary | ICD-10-CM | POA: Diagnosis not present

## 2018-07-19 DIAGNOSIS — R82998 Other abnormal findings in urine: Secondary | ICD-10-CM | POA: Diagnosis not present

## 2018-07-19 LAB — BASIC METABOLIC PANEL
BUN: 9 mg/dL (ref 6–23)
CO2: 28 mEq/L (ref 19–32)
Calcium: 8.7 mg/dL (ref 8.4–10.5)
Chloride: 102 mEq/L (ref 96–112)
Creatinine, Ser: 0.7 mg/dL (ref 0.40–1.20)
GFR: 87.44 mL/min (ref >60.00)
Glucose, Bld: 90 mg/dL (ref 70–99)
Potassium: 4.3 mEq/L (ref 3.5–5.1)
Sodium: 138 mEq/L (ref 135–145)

## 2018-07-19 LAB — IBC + FERRITIN
Ferritin: 342.1 ng/mL — ABNORMAL HIGH (ref 10.0–291.0)
Iron: 51 ug/dL (ref 42–145)
Saturation Ratios: 10.5 % — ABNORMAL LOW (ref 20.0–50.0)
Transferrin: 348 mg/dL (ref 212.0–360.0)

## 2018-07-19 LAB — CBC WITH DIFFERENTIAL/PLATELET
Basophils Absolute: 0.1 10*3/uL (ref 0.0–0.1)
Basophils Relative: 1.1 % (ref 0.0–3.0)
Eosinophils Absolute: 0.1 10*3/uL (ref 0.0–0.7)
Eosinophils Relative: 1.5 % (ref 0.0–5.0)
HCT: 33 % — ABNORMAL LOW (ref 36.0–46.0)
Hemoglobin: 10.6 g/dL — ABNORMAL LOW (ref 12.0–15.0)
Lymphocytes Relative: 26.2 % (ref 12.0–46.0)
Lymphs Abs: 1.7 10*3/uL (ref 0.7–4.0)
MCHC: 32.2 g/dL (ref 30.0–36.0)
MCV: 89.8 fl (ref 78.0–100.0)
Monocytes Absolute: 0.6 10*3/uL (ref 0.1–1.0)
Monocytes Relative: 8.9 % (ref 3.0–12.0)
Neutro Abs: 4.1 10*3/uL (ref 1.4–7.7)
Neutrophils Relative %: 62.3 % (ref 43.0–77.0)
Platelets: 372 10*3/uL (ref 150.0–400.0)
RBC: 3.67 Mil/uL — ABNORMAL LOW (ref 3.87–5.11)
RDW: 19.6 % — ABNORMAL HIGH (ref 11.5–15.5)
WBC: 6.7 10*3/uL (ref 4.0–10.5)

## 2018-07-19 NOTE — Addendum Note (Signed)
Addended by: Kelle Darting A on: 07/19/2018 08:21 AM   Modules accepted: Orders

## 2018-07-19 NOTE — Telephone Encounter (Signed)
CMA: pt had a trace of leukocytes in her urine yesterday. did you want that cultured?  Per Dr Carollee Herter:  yes please  Order entered and completed.

## 2018-07-21 LAB — URINE CULTURE
MICRO NUMBER:: 612001
SPECIMEN QUALITY:: ADEQUATE

## 2018-07-22 ENCOUNTER — Encounter: Payer: Self-pay | Admitting: Family Medicine

## 2018-07-22 ENCOUNTER — Other Ambulatory Visit: Payer: Self-pay | Admitting: Family Medicine

## 2018-07-22 DIAGNOSIS — N390 Urinary tract infection, site not specified: Secondary | ICD-10-CM

## 2018-07-22 DIAGNOSIS — K921 Melena: Secondary | ICD-10-CM

## 2018-07-22 MED ORDER — NITROFURANTOIN MONOHYD MACRO 100 MG PO CAPS: 100.0000 mg | ORAL_CAPSULE | Freq: Two times a day (BID) | ORAL | 0 refills | Status: DC

## 2018-07-22 NOTE — Telephone Encounter (Signed)
See labs It is under 12 but much better ---  We can recheck cbcd this week to see if it has come up some more

## 2018-07-24 ENCOUNTER — Other Ambulatory Visit (INDEPENDENT_AMBULATORY_CARE_PROVIDER_SITE_OTHER): Payer: BC Managed Care – PPO

## 2018-07-24 ENCOUNTER — Telehealth: Payer: Self-pay | Admitting: General Surgery

## 2018-07-24 ENCOUNTER — Other Ambulatory Visit: Payer: Self-pay

## 2018-07-24 ENCOUNTER — Telehealth: Payer: Self-pay | Admitting: Family Medicine

## 2018-07-24 DIAGNOSIS — K921 Melena: Secondary | ICD-10-CM

## 2018-07-24 DIAGNOSIS — R030 Elevated blood-pressure reading, without diagnosis of hypertension: Secondary | ICD-10-CM | POA: Diagnosis not present

## 2018-07-24 DIAGNOSIS — M47812 Spondylosis without myelopathy or radiculopathy, cervical region: Secondary | ICD-10-CM | POA: Diagnosis not present

## 2018-07-24 NOTE — Telephone Encounter (Signed)
Left a voicemail for the patient to contact the office for a Covid 19screening for in office visit on 07/25/2018

## 2018-07-24 NOTE — Telephone Encounter (Signed)
Pt called to have labs drawn to recheck hemoglobin and worried results won't be back in time. She is asking if she can get a work note to return to work next Tues/Wed since she may not have results for Monday to return.

## 2018-07-24 NOTE — Telephone Encounter (Signed)
Please advise 

## 2018-07-24 NOTE — Telephone Encounter (Signed)
It is ok to give her a note and get labs whenever she wants

## 2018-07-25 ENCOUNTER — Ambulatory Visit (INDEPENDENT_AMBULATORY_CARE_PROVIDER_SITE_OTHER): Payer: BC Managed Care – PPO | Admitting: Family Medicine

## 2018-07-25 ENCOUNTER — Ambulatory Visit (INDEPENDENT_AMBULATORY_CARE_PROVIDER_SITE_OTHER): Payer: BC Managed Care – PPO | Admitting: Nurse Practitioner

## 2018-07-25 ENCOUNTER — Encounter: Payer: Self-pay | Admitting: Nurse Practitioner

## 2018-07-25 ENCOUNTER — Encounter: Payer: Self-pay | Admitting: Family Medicine

## 2018-07-25 ENCOUNTER — Telehealth: Payer: Self-pay

## 2018-07-25 VITALS — BP 124/72 | HR 92 | Temp 97.5°F | Ht 62.0 in | Wt 122.5 lb

## 2018-07-25 DIAGNOSIS — K921 Melena: Secondary | ICD-10-CM

## 2018-07-25 DIAGNOSIS — R1011 Right upper quadrant pain: Secondary | ICD-10-CM

## 2018-07-25 DIAGNOSIS — K279 Peptic ulcer, site unspecified, unspecified as acute or chronic, without hemorrhage or perforation: Secondary | ICD-10-CM | POA: Diagnosis not present

## 2018-07-25 LAB — CBC WITH DIFFERENTIAL/PLATELET
Basophils Absolute: 0 10*3/uL (ref 0.0–0.1)
Basophils Relative: 0.9 % (ref 0.0–3.0)
Eosinophils Absolute: 0.1 10*3/uL (ref 0.0–0.7)
Eosinophils Relative: 1.6 % (ref 0.0–5.0)
HCT: 34.9 % — ABNORMAL LOW (ref 36.0–46.0)
Hemoglobin: 11.2 g/dL — ABNORMAL LOW (ref 12.0–15.0)
Lymphocytes Relative: 24.8 % (ref 12.0–46.0)
Lymphs Abs: 1.4 10*3/uL (ref 0.7–4.0)
MCHC: 32.2 g/dL (ref 30.0–36.0)
MCV: 90.9 fl (ref 78.0–100.0)
Monocytes Absolute: 0.4 10*3/uL (ref 0.1–1.0)
Monocytes Relative: 6.9 % (ref 3.0–12.0)
Neutro Abs: 3.7 10*3/uL (ref 1.4–7.7)
Neutrophils Relative %: 65.8 % (ref 43.0–77.0)
Platelets: 279 10*3/uL (ref 150.0–400.0)
RBC: 3.84 Mil/uL — ABNORMAL LOW (ref 3.87–5.11)
RDW: 21.5 % — ABNORMAL HIGH (ref 11.5–15.5)
WBC: 5.7 10*3/uL (ref 4.0–10.5)

## 2018-07-25 NOTE — Progress Notes (Signed)
Chief Complaint:   Hospital follow-up  IMPRESSION and PLAN:    37.  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 -No further bleeding.  Patient needs repeat EGD to ensure ulcer healing.  We will schedule this to be done in the next 6 to 8 weeks. The risks and benefits of EGD were discussed and the patient agrees to proceed. -Continue twice daily PPI until EGD when further recommendations will be made  2.  Intermittent RUQ pain radiating through to back associated with eating, especially greasy / spicy food.  Patient concerned about gallbladder pathology.  CTAP with contrast last month showed normal-appearing gallbladder.  -We will obtain RUQ ultrasound though wonder if intermittent pain not secondary to anastomotic ulcers.  -Patient lives several pounds at work every day.  The pain not positional or related to movement I still wonder about musculoskeletal origin.  -will call her with ultrasound results  Colon cancer screening, up-to-date.  Next colonoscopy due March 2024   HPI:     Patient is a 53 year old female with history of Roux-en-Y in 2007.  was hospitalized late June with upper GI bleed / symptomatic anemia.  No problems post-operatively until February of this year when she had perforation of a marginal ulcer requiring laparotomy and patch repair.  Following that in mid June patient admitted with UGIB / anemia.  Nadir hemoglobin 7.6, down from 10.4 in February.  Inpatient EGD revealed 2 clean-based marginal ulcers.  Gastrojejunal anastomosis was friable, had contact bleeding requiring 2 clips for hemostasis. She received IV iron ( no blood transfusion). Hgb up to 11.2 yesterday. Patient is here for follow up.   Patient has not had any further bleeding.  She is taking Prilosec twice daily, opening the capsule and sprinkling on  applesauce to facilitate absorption.  She complains of intermittent RUQ pain over the last several months. Pain is always associated with eating, especially fatty foods.  Pain is transient, very sharp.  She lifts a lot of weight every day at work but says the pain is unrelated to activity. She is worried about possible gallbladder problems.  Normal-appearing gallbladder on CTAP with contrast last month  EGD 07/11/18 -Two clean based marginal ulcers. - Z-line variable, at the gastroesophageal junction. Biopsied. - Small hiatal hernia. - Roux-en-Y gastrojejunostomy with gastrojejunal anastomosis characterized by friable mucosa and contact bleeding requiring 2 clips for hemostasis.  Review of systems:     No chest pain, no SOB, no fevers, no urinary sx   Past Medical History:  Diagnosis Date  . Allergy   . Anemia   . Anxiety   . Asthma   . Depression   . GERD (gastroesophageal reflux disease)   . Heart murmur   . UTI (urinary tract infection)     Patient's surgical history, family medical history, social history, medications and allergies were all reviewed in Epic   Serum creatinine: 0.7 mg/dL 07/18/18 1505 Estimated creatinine clearance: 64.3 mL/min  Current Outpatient Medications  Medication Sig Dispense Refill  . Acetaminophen (TYLENOL 8 HOUR ARTHRITIS PAIN PO) Take 2 tablets by mouth as needed.    . Acetaminophen (TYLENOL PO) Take 1 tablet by mouth as needed (Rapid release tablet).    . ALPRAZolam (XANAX) 1 MG tablet Take 0.5 mg by mouth as needed (before procedures or anxiety).     . Cholecalciferol (VITAMIN D3) 2000 units  capsule Take 1 capsule (2,000 Units total) by mouth daily. (Patient taking differently: Take 2,000 Units by mouth daily. Takes 2 gummies a day) 30 capsule 1  . Cyanocobalamin 1000 MCG/ML KIT Inject 1,000 mcg as directed every 30 (thirty) days. 1 kit 0  . cyclobenzaprine (FLEXERIL) 10 MG tablet Take 1 tablet (10 mg total) by mouth 3 (three) times daily as needed.  (Patient taking differently: Take 10 mg by mouth as needed for muscle spasms. ) 30 tablet 0  . dextroamphetamine (DEXTROSTAT) 10 MG tablet Take 20 mg by mouth 3 (three) times daily.     . fluticasone (FLONASE) 50 MCG/ACT nasal spray Place 2 sprays into both nostrils daily. (Patient taking differently: Place 2 sprays into both nostrils daily as needed for allergies or rhinitis. ) 16 g 5  . Homeopathic Products (ALLERGY MEDICINE PO) Take 1 tablet by mouth as needed (Takes the Clortabs. Equate Brand).    . loperamide (ANTI-DIARRHEAL) 2 MG capsule Take 2 mg by mouth daily as needed.     Marland Kitchen omeprazole (PRILOSEC) 40 MG capsule Take 1 capsule (40 mg total) by mouth 2 (two) times a day. Open capsule and sprinkle on applesauce. 60 capsule 11  . Probiotic Product (ALIGN) CHEW Chew 2 tablets by mouth daily. Align pre and probiotic with vitamain C-take one tablet each Align pre and probiotic with Vitamin b12 for immune and energy-take one tablet each    . rOPINIRole (REQUIP) 1 MG tablet TAKE 1 TABLET (1 MG TOTAL) BY MOUTH AT BEDTIME. (Patient taking differently: Take 1 mg by mouth at bedtime. ) 90 tablet 3  . SYRINGE-NEEDLE, DISP, 3 ML (B-D 3CC LUER-LOK SYR 25GX5/8") 25G X 5/8" 3 ML MISC USE AS DIRECTED WITH B-12 INJECTION 16 each 3  . Vilazodone HCl (VIIBRYD) 20 MG TABS Take 1 tablet by mouth daily. At lunch    . Vilazodone HCl (VIIBRYD) 40 MG TABS Take 40 mg by mouth daily. In the morning    . nitrofurantoin, macrocrystal-monohydrate, (MACROBID) 100 MG capsule Take 1 capsule (100 mg total) by mouth 2 (two) times daily. (Patient not taking: Reported on 07/25/2018) 14 capsule 0  . trimethoprim (TRIMPEX) 100 MG tablet Take 1 tablet (100 mg total) by mouth daily. (Patient not taking: Reported on 07/25/2018) 90 tablet 1   No current facility-administered medications for this visit.     Physical Exam:     BP 124/72   Pulse 92   Temp (!) 97.5 F (36.4 C)   Ht _0  (1.575 m)   Wt 122 lb 8 oz (55.6 kg)   BMI  22.41 kg/m   GENERAL:  Pleasant female in NAD PSYCH: : Cooperative, normal affect EENT:  conjunctiva pink, mucous membranes moist, neck supple without masses CARDIAC:  RRR,  no peripheral edema PULM: Normal respiratory effort, lungs CTA bilaterally, no wheezing ABDOMEN:  Nondistended, soft, nontender. No obvious masses, no hepatomegaly,  normal bowel sounds SKIN:  turgor, no lesions seen Musculoskeletal:  Normal muscle tone, normal strength NEURO: Alert and oriented x 3, no focal neurologic deficits   Tye Savoy , NP 07/25/2018, 1:39 PM

## 2018-07-25 NOTE — Telephone Encounter (Signed)
Covid-19 screening questions   Do you now or have you had a fever in the last 14 days? No  Do you have any respiratory symptoms of shortness of breath or cough now or in the last 14 days? NO  Do you have any family members or close contacts with diagnosed or suspected Covid-19 in the past 14 days? No  Have you been tested for Covid-19 and found to be positive? Found to be negative 2 times. Tested at [facility] in June and in April through the [facility]

## 2018-07-25 NOTE — Assessment & Plan Note (Addendum)
Lab Results  Component Value Date   HGB 11.2 (L) 07/24/2018   con't iron  F/uGI today

## 2018-07-25 NOTE — Progress Notes (Signed)
Virtual Visit via Video Note  I connected with [name] on 07/25/18 at 11:15 AM EDT by a video enabled telemedicine application and verified that I am speaking with the correct person using two identifiers.  Location: Patient: home  Provider: office    I discussed the limitations of evaluation and management by telemedicine and the availability of in person appointments. The patient expressed understanding and agreed to proceed.  History of Present Illness: Pt is home and needs new note for work ---- note she said return wed 7/7 and wed is 7/8  She is having some R sided abd pain with eating only but has app with GI and surgeon today  Observations/Objective: No vitals obtained Pt is in NAD  Assessment and Plan: 1. RUQ pain Pt has app with surgery and GI today   2. Melena Lab Results  Component Value Date   HGB 11.2 (L) 07/24/2018  con't iron F/u GI Work note fixed     Follow Up Instructions:    I discussed the assessment and treatment plan with the patient. The patient was provided an opportunity to ask questions and all were answered. The patient agreed with the plan and demonstrated an understanding of the instructions.   The patient was advised to call back or seek an in-person evaluation if the symptoms worsen or if the condition fails to improve as anticipated.  I provided 15 minutes of non-face-to-face time during this encounter.   [name], DO

## 2018-07-25 NOTE — Telephone Encounter (Signed)
[name]  I have sent this patient a letter to her my chart, can you print it off just incase she wants to come in to get the letter

## 2018-07-25 NOTE — Progress Notes (Signed)
Agree with assessment and plan as outlined.  

## 2018-07-25 NOTE — Patient Instructions (Signed)
If you are age 53 or older, your body mass index should be between 23-30. Your Body mass index is 22.41 kg/m. If this is out of the aforementioned range listed, please consider follow up with your Primary Care Provider.  If you are age 33 or younger, your body mass index should be between 19-25. Your Body mass index is 22.41 kg/m. If this is out of the aformentioned range listed, please consider follow up with your Primary Care Provider.   You have been scheduled for an abdominal ultrasound at [facility] (1st floor of hospital) on 07/30/18 at 8:30 am. Please arrive 15 minutes prior to your appointment for registration. Make certain not to have anything to eat or drink after midnight the day prior to your appointment. Should you need to reschedule your appointment, please contact radiology at [phone]. This test typically takes about 30 minutes to perform.  Continue Omeprazole twice daily.  We will contact you with a date for EGD in September with Dr. Havery Moros.  Thank you for choosing me and [facility].   Tye Savoy, NP

## 2018-07-26 ENCOUNTER — Encounter: Payer: Self-pay | Admitting: Family Medicine

## 2018-07-29 NOTE — Telephone Encounter (Signed)
Copies made of both FMLA paperwork. Placed in scan and copies being sent to patient.

## 2018-07-30 ENCOUNTER — Ambulatory Visit (HOSPITAL_COMMUNITY)
Admission: RE | Admit: 2018-07-30 | Discharge: 2018-07-30 | Disposition: A | Discharge status: Home / Self Care | Payer: BC Managed Care – PPO | Source: Ambulatory Visit | Attending: Nurse Practitioner | Admitting: Nurse Practitioner

## 2018-07-30 ENCOUNTER — Other Ambulatory Visit: Payer: Self-pay

## 2018-07-30 DIAGNOSIS — R1011 Right upper quadrant pain: Secondary | ICD-10-CM | POA: Diagnosis not present

## 2018-07-30 DIAGNOSIS — K279 Peptic ulcer, site unspecified, unspecified as acute or chronic, without hemorrhage or perforation: Secondary | ICD-10-CM | POA: Insufficient documentation

## 2018-07-30 NOTE — Telephone Encounter (Addendum)
Patient checking on the status of FMLA paperwork mailed, informed patient do to the Holiday delay paperwork may have been sent out 07/29/2018. Patient requesting Dr. Note return back to work [place] case # [id number] FN (please reflect on Dr.Note case #) and upload to My Chart, there is a section that requires patient signature.

## 2018-07-31 DIAGNOSIS — Z9884 Bariatric surgery status: Secondary | ICD-10-CM | POA: Diagnosis not present

## 2018-08-02 ENCOUNTER — Telehealth: Payer: Self-pay

## 2018-08-02 DIAGNOSIS — Z20828 Contact with and (suspected) exposure to other viral communicable diseases: Secondary | ICD-10-CM | POA: Diagnosis not present

## 2018-08-02 NOTE — Telephone Encounter (Signed)
Copied from [place] [id number]. Topic: General - Other >> Aug 02, 2018 12:14 PM Rainey Pines A wrote: Patient stated that there are blank sections on her FMLA paperwork that she needs to be taken care of and would like a call back

## 2018-08-06 ENCOUNTER — Encounter: Payer: Self-pay | Admitting: Family Medicine

## 2018-08-06 NOTE — Telephone Encounter (Signed)
Patient states [employer] form page 3 the maximum frequency should indicate "4x per 1 month" and frequency needs to be "4" please date and signed by PCP and fax to [facility], please note when fax.  FMLA regarding taking care of spouse thru [place] 2 questions were left blank on page 3 question 5 and 7 please indicate N/A or No and sign and date and fax to [facility], please note when fax.    Question 8 on page 3 as per spouse notes "Spouse needs to take patient to appointments due to patient not comprehending what's discuss at appointments, please don't state patient is not able to drive"  *patient would like to know if [employer] forms have been received" forms due on the 08/11/2018 , please advise

## 2018-08-06 NOTE — Telephone Encounter (Signed)
error:[id number] ° °

## 2018-08-07 NOTE — Telephone Encounter (Signed)
Yes please - thank you!

## 2018-08-07 NOTE — Telephone Encounter (Signed)
Please read below. Okay to add to [facility] forms?

## 2018-08-07 NOTE — Telephone Encounter (Signed)
Forms fixed. Faxing again. No [employer] forms. Pt notifed via Mychart.

## 2018-08-09 ENCOUNTER — Other Ambulatory Visit: Payer: Self-pay | Admitting: Family Medicine

## 2018-08-09 DIAGNOSIS — E538 Deficiency of other specified B group vitamins: Secondary | ICD-10-CM

## 2018-08-21 DIAGNOSIS — M47812 Spondylosis without myelopathy or radiculopathy, cervical region: Secondary | ICD-10-CM | POA: Diagnosis not present

## 2018-08-27 ENCOUNTER — Encounter: Payer: Self-pay | Admitting: Family Medicine

## 2018-08-27 ENCOUNTER — Ambulatory Visit (INDEPENDENT_AMBULATORY_CARE_PROVIDER_SITE_OTHER): Payer: BC Managed Care – PPO | Admitting: Family Medicine

## 2018-08-27 ENCOUNTER — Other Ambulatory Visit: Payer: Self-pay

## 2018-08-27 VITALS — BP 116/64 | HR 60 | Temp 97.9°F | Resp 18 | Ht 62.0 in | Wt 123.2 lb

## 2018-08-27 DIAGNOSIS — G8929 Other chronic pain: Secondary | ICD-10-CM

## 2018-08-27 DIAGNOSIS — Z0001 Encounter for general adult medical examination with abnormal findings: Secondary | ICD-10-CM

## 2018-08-27 DIAGNOSIS — R600 Localized edema: Secondary | ICD-10-CM | POA: Diagnosis not present

## 2018-08-27 DIAGNOSIS — M542 Cervicalgia: Secondary | ICD-10-CM

## 2018-08-27 DIAGNOSIS — Z23 Encounter for immunization: Secondary | ICD-10-CM

## 2018-08-27 DIAGNOSIS — Z0000 Encounter for general adult medical examination without abnormal findings: Secondary | ICD-10-CM

## 2018-08-27 DIAGNOSIS — G2581 Restless legs syndrome: Secondary | ICD-10-CM

## 2018-08-27 LAB — POC URINALSYSI DIPSTICK (AUTOMATED)
Bilirubin, UA: NEGATIVE
Blood, UA: NEGATIVE
Glucose, UA: NEGATIVE
Ketones, UA: NEGATIVE
Leukocytes, UA: NEGATIVE
Nitrite, UA: NEGATIVE
Protein, UA: NEGATIVE
Spec Grav, UA: 1.015 (ref 1.010–1.025)
Urobilinogen, UA: 0.2 E.U./dL
pH, UA: 6 (ref 5.0–8.0)

## 2018-08-27 MED ORDER — ROPINIROLE HCL 1 MG PO TABS: ORAL_TABLET | ORAL | 3 refills | Status: DC

## 2018-08-27 MED ORDER — HYDROCHLOROTHIAZIDE 25 MG PO TABS: 25.0000 mg | ORAL_TABLET | Freq: Every day | ORAL | 3 refills | Status: DC

## 2018-08-27 NOTE — Patient Instructions (Signed)

## 2018-08-27 NOTE — Progress Notes (Signed)
Subjective:     [name] is a 53 y.o. female and is here for a comprehensive physical exam. The patient reports problems - urine is dark .  Social History   Socioeconomic History  . Marital status: Married    Spouse name: Not on file  . Number of children: Not on file  . Years of education: Not on file  . Highest education level: Not on file  Occupational History    Employer: [employer]    Comment: [employer]  Social Needs  . Financial resource strain: Not on file  . Food insecurity    Worry: Not on file    Inability: Not on file  . Transportation needs    Medical: Not on file    Non-medical: Not on file  Tobacco Use  . Smoking status: Former Smoker    Quit date: 01/24/1987    Years since quitting: 31.6  . Smokeless tobacco: Never Used  Substance and Sexual Activity  . Alcohol use: Yes    Alcohol/week: 35.0 standard drinks    Types: 35 Cans of beer per week    Comment: Pt states she drinks 5 beers mosts days  . Drug use: No  . Sexual activity: Not Currently  Lifestyle  . Physical activity    Days per week: Not on file    Minutes per session: Not on file  . Stress: Not on file  Relationships  . Social Herbalist on phone: Not on file    Gets together: Not on file    Attends religious service: Not on file    Active member of club or organization: Not on file    Attends meetings of clubs or organizations: Not on file    Relationship status: Not on file  . Intimate partner violence    Fear of current or ex partner: Not on file    Emotionally abused: Not on file    Physically abused: Not on file    Forced sexual activity: Not on file  Other Topics Concern  . Not on file  Social History Narrative   Regular exercise--no (because of knees)   Health Maintenance  Topic Date Due  . TETANUS/TDAP  02/26/2018  . INFLUENZA VACCINE  08/24/2018  . MAMMOGRAM  04/09/2019  . COLONOSCOPY  04/16/2022  . HIV Screening  Completed    The following portions of  the patient's history were reviewed and updated as appropriate:  She  has a past medical history of Allergy, Anemia, Anxiety, Asthma, Depression, GERD (gastroesophageal reflux disease), Heart murmur, and UTI (urinary tract infection). She does not have any pertinent problems on file. She  has a past surgical history that includes Abdominal hysterectomy; Gastric bypass (2007); Tubal ligation; laparotomy (N/A, 03/08/2018); Esophagogastroduodenoscopy (egd) with propofol (N/A, 07/11/2018); biopsy (07/11/2018); and Hemostasis clip placement (07/11/2018). Her family history includes Cancer in her father, maternal grandfather, and paternal grandfather; Depression in her brother and sister; Diabetes in her maternal grandfather, maternal grandmother, paternal grandfather, and paternal grandmother; Hypertension in her father; Liver disease in her maternal grandfather; Stroke in her mother. She  reports that she quit smoking about 31 years ago. She has never used smokeless tobacco. She reports current alcohol use of about 35.0 standard drinks of alcohol per week. She reports that she does not use drugs. She has a current medication list which includes the following prescription(s): acetaminophen, acetaminophen, alprazolam, vitamin d3, cyanocobalamin, cyclobenzaprine, dextroamphetamine, fluticasone, homeopathic products, loperamide, nitrofurantoin (macrocrystal-monohydrate), omeprazole, align, ropinirole, b-d 3cc  luer-lok syr 25gx5/8", trimethoprim, viibryd, vilazodone hcl, hydrochlorothiazide, azelastine-fluticasone, loratadine, montelukast, rabeprazole, and trazodone. Current Outpatient Medications on File Prior to Visit  Medication Sig Dispense Refill  . Acetaminophen (TYLENOL 8 HOUR ARTHRITIS PAIN PO) Take 2 tablets by mouth as needed.    . Acetaminophen (TYLENOL PO) Take 1 tablet by mouth as needed (Rapid release tablet).    . ALPRAZolam (XANAX) 1 MG tablet Take 0.5 mg by mouth as needed (before procedures or  anxiety).     . Cholecalciferol (VITAMIN D3) 2000 units capsule Take 1 capsule (2,000 Units total) by mouth daily. (Patient taking differently: Take 2,000 Units by mouth daily. Takes 2 gummies a day) 30 capsule 1  . cyanocobalamin (,VITAMIN B-12,) 1000 MCG/ML injection INJECT 1,000 MCG AS DIRECTED EVERY 30 DAYS. 1 mL 0  . cyclobenzaprine (FLEXERIL) 10 MG tablet Take 1 tablet (10 mg total) by mouth 3 (three) times daily as needed. (Patient taking differently: Take 10 mg by mouth as needed for muscle spasms. ) 30 tablet 0  . dextroamphetamine (DEXTROSTAT) 10 MG tablet Take 20 mg by mouth 3 (three) times daily.     . fluticasone (FLONASE) 50 MCG/ACT nasal spray Place 2 sprays into both nostrils daily. (Patient taking differently: Place 2 sprays into both nostrils daily as needed for allergies or rhinitis. ) 16 g 5  . Homeopathic Products (ALLERGY MEDICINE PO) Take 1 tablet by mouth as needed (Takes the Clortabs. Equate Brand).    . loperamide (ANTI-DIARRHEAL) 2 MG capsule Take 2 mg by mouth daily as needed.     . nitrofurantoin, macrocrystal-monohydrate, (MACROBID) 100 MG capsule Take 1 capsule (100 mg total) by mouth 2 (two) times daily. 14 capsule 0  . omeprazole (PRILOSEC) 40 MG capsule Take 1 capsule (40 mg total) by mouth 2 (two) times a day. Open capsule and sprinkle on applesauce. 60 capsule 11  . Probiotic Product (ALIGN) CHEW Chew 2 tablets by mouth daily. Align pre and probiotic with vitamain C-take one tablet each Align pre and probiotic with Vitamin b12 for immune and energy-take one tablet each    . SYRINGE-NEEDLE, DISP, 3 ML (B-D 3CC LUER-LOK SYR 25GX5/8") 25G X 5/8" 3 ML MISC USE AS DIRECTED WITH B-12 INJECTION 16 each 3  . trimethoprim (TRIMPEX) 100 MG tablet Take 1 tablet (100 mg total) by mouth daily. 90 tablet 1  . Vilazodone HCl (VIIBRYD) 20 MG TABS Take 1 tablet by mouth daily. At lunch    . Vilazodone HCl (VIIBRYD) 40 MG TABS Take 40 mg by mouth daily. In the morning    .  [DISCONTINUED] Azelastine-Fluticasone (DYMISTA) 137-50 MCG/ACT SUSP Place 1 spray into the nose 2 (two) times daily.    . [DISCONTINUED] loratadine (CLARITIN) 10 MG tablet Take 10 mg by mouth daily.      . [DISCONTINUED] montelukast (SINGULAIR) 10 MG tablet Take 10 mg by mouth at bedtime.      . [DISCONTINUED] RABEprazole (ACIPHEX) 20 MG tablet Take 20 mg by mouth daily.      . [DISCONTINUED] traZODone (DESYREL) 100 MG tablet Take 100 mg by mouth at bedtime.       No current facility-administered medications on file prior to visit.    She is allergic to penicillins and codeine..  Review of Systems Review of Systems  Constitutional: Negative for activity change, appetite change and fatigue.  HENT: Negative for hearing loss, congestion, tinnitus and ear discharge.  dentist q73m Eyes: Negative for visual disturbance (see optho q1y -- vision corrected to 20/20  with glasses).  Respiratory: Negative for cough, chest tightness and shortness of breath.   Cardiovascular: Negative for chest pain, palpitations and leg swelling.  Gastrointestinal: Negative for abdominal pain, diarrhea, constipation and abdominal distention.  Genitourinary: Negative for urgency, frequency, decreased urine volume and difficulty urinating.  Musculoskeletal: Negative for back pain, arthralgias and gait problem.  Skin: Negative for color change, pallor and rash.  Neurological: Negative for dizziness, light-headedness, numbness and headaches.  Hematological: Negative for adenopathy. Does not bruise/bleed easily.  Psychiatric/Behavioral: Negative for suicidal ideas, confusion, sleep disturbance, self-injury, dysphoric mood, decreased concentration and agitation.       Objective:    BP 116/64 (BP Location: Left Arm, Patient Position: Sitting, Cuff Size: Normal)   Pulse 60   Temp 97.9 F (36.6 C) (Oral)   Resp 18   Ht 5\' 2"  (1.575 m)   Wt 123 lb 3.2 oz (55.9 kg)   SpO2 100%   BMI 22.53 kg/m  General appearance:  alert, cooperative, appears stated age and no distress Head: Normocephalic, without obvious abnormality, atraumatic Eyes: negative findings: lids and lashes normal, conjunctivae and sclerae normal and pupils equal, round, reactive to light and accomodation Ears: normal TM's and external ear canals both ears Nose: Nares normal. Septum midline. Mucosa normal. No drainage or sinus tenderness. Throat: lips, mucosa, and tongue normal; teeth and gums normal Neck: no adenopathy, no carotid bruit, no JVD, supple, symmetrical, trachea midline and thyroid not enlarged, symmetric, no tenderness/mass/nodules Back: symmetric, no curvature. ROM normal. No CVA tenderness. Lungs: clear to auscultation bilaterally Breasts: normal appearance, no masses or tenderness Heart: regular rate and rhythm, S1, S2 normal, no murmur, click, rub or gallop Abdomen: soft, non-tender; bowel sounds normal; no masses,  no organomegaly Pelvic: not indicated; status post hysterectomy, negative ROS Extremities: +1 pitting edema b/l low ext  Pulses: 2+ and symmetric Skin: Skin color, texture, turgor normal. No rashes or lesions Lymph nodes: Cervical, supraclavicular, and axillary nodes normal. Neurologic: Alert and oriented X 3, normal strength and tone. Normal symmetric reflexes. Normal coordination and gait    Assessment:    Healthy female exam.      Plan:    ghm utd Check labs  See After Visit Summary for Counseling Recommendations     1. Preventative health care See above - POCT Urinalysis Dipstick (Automated) - TSH - Lipid panel - CBC with Differential/Platelet - Comprehensive metabolic panel - Vitamin D17 - Vitamin D (25 hydroxy)  2. Need for tetanus booster   - Tdap vaccine greater than or equal to 7yo IM  3. Chronic neck pain Pt asking for second opinion  - Ambulatory referral to Neurosurgery  4. Lower extremity edema  Elevate legs  Compression socks  - hydrochlorothiazide (HYDRODIURIL) 25 MG  tablet; Take 1 tablet (25 mg total) by mouth daily.  Dispense: 90 tablet; Refill: 3  5. RLS (restless legs syndrome) Stable--- refill meds  - rOPINIRole (REQUIP) 1 MG tablet; TAKE 1 TABLET (1 MG TOTAL) BY MOUTH AT BEDTIME.  Dispense: 90 tablet; Refill: 3

## 2018-08-28 LAB — COMPREHENSIVE METABOLIC PANEL
ALT: 53 IU/L — ABNORMAL HIGH (ref 0–32)
AST: 45 IU/L — ABNORMAL HIGH (ref 0–40)
Albumin/Globulin Ratio: 1.9 (ref 1.2–2.2)
Albumin: 4 g/dL (ref 3.8–4.9)
Alkaline Phosphatase: 63 IU/L (ref 39–117)
BUN/Creatinine Ratio: 18 (ref 9–23)
BUN: 10 mg/dL (ref 6–24)
Bilirubin Total: 0.3 mg/dL (ref 0.0–1.2)
CO2: 25 mmol/L (ref 20–29)
Calcium: 8.9 mg/dL (ref 8.7–10.2)
Chloride: 103 mmol/L (ref 96–106)
Creatinine, Ser: 0.55 mg/dL — ABNORMAL LOW (ref 0.57–1.00)
GFR calc Af Amer: 124 mL/min/{1.73_m2} (ref >59)
GFR calc non Af Amer: 107 mL/min/{1.73_m2} (ref >59)
Globulin, Total: 2.1 g/dL (ref 1.5–4.5)
Glucose: 88 mg/dL (ref 65–99)
Potassium: 4.7 mmol/L (ref 3.5–5.2)
Sodium: 140 mmol/L (ref 134–144)
Total Protein: 6.1 g/dL (ref 6.0–8.5)

## 2018-08-28 LAB — CBC WITH DIFFERENTIAL/PLATELET
Basophils Absolute: 0 10*3/uL (ref 0.0–0.2)
Basos: 1 %
EOS (ABSOLUTE): 0.1 10*3/uL (ref 0.0–0.4)
Eos: 3 %
Hematocrit: 38.6 % (ref 34.0–46.6)
Hemoglobin: 12.6 g/dL (ref 11.1–15.9)
Immature Grans (Abs): 0 10*3/uL (ref 0.0–0.1)
Immature Granulocytes: 0 %
Lymphocytes Absolute: 1.6 10*3/uL (ref 0.7–3.1)
Lymphs: 37 %
MCH: 29 pg (ref 26.6–33.0)
MCHC: 32.6 g/dL (ref 31.5–35.7)
MCV: 89 fL (ref 79–97)
Monocytes Absolute: 0.3 10*3/uL (ref 0.1–0.9)
Monocytes: 8 %
Neutrophils Absolute: 2.1 10*3/uL (ref 1.4–7.0)
Neutrophils: 51 %
Platelets: 226 10*3/uL (ref 150–450)
RBC: 4.34 x10E6/uL (ref 3.77–5.28)
RDW: 16.1 % — ABNORMAL HIGH (ref 11.7–15.4)
WBC: 4.2 10*3/uL (ref 3.4–10.8)

## 2018-08-28 LAB — LIPID PANEL
Chol/HDL Ratio: 2.6 ratio (ref 0.0–4.4)
Cholesterol, Total: 158 mg/dL (ref 100–199)
HDL: 60 mg/dL (ref >39)
LDL Calculated: 79 mg/dL (ref 0–99)
Triglycerides: 94 mg/dL (ref 0–149)
VLDL Cholesterol Cal: 19 mg/dL (ref 5–40)

## 2018-08-28 LAB — VITAMIN D 25 HYDROXY (VIT D DEFICIENCY, FRACTURES): Vit D, 25-Hydroxy: 38.7 ng/mL (ref 30.0–100.0)

## 2018-08-28 LAB — VITAMIN B12: Vitamin B-12: 1187 pg/mL (ref 232–1245)

## 2018-08-28 LAB — TSH: TSH: 1.9 u[IU]/mL (ref 0.450–4.500)

## 2018-08-30 DIAGNOSIS — Z9884 Bariatric surgery status: Secondary | ICD-10-CM | POA: Diagnosis not present

## 2018-08-30 DIAGNOSIS — K289 Gastrojejunal ulcer, unspecified as acute or chronic, without hemorrhage or perforation: Secondary | ICD-10-CM | POA: Diagnosis not present

## 2018-09-11 ENCOUNTER — Other Ambulatory Visit: Payer: Self-pay | Admitting: Family Medicine

## 2018-09-11 DIAGNOSIS — E538 Deficiency of other specified B group vitamins: Secondary | ICD-10-CM

## 2018-09-13 DIAGNOSIS — F900 Attention-deficit hyperactivity disorder, predominantly inattentive type: Secondary | ICD-10-CM | POA: Diagnosis not present

## 2018-09-13 DIAGNOSIS — F4322 Adjustment disorder with anxiety: Secondary | ICD-10-CM | POA: Diagnosis not present

## 2018-09-16 ENCOUNTER — Other Ambulatory Visit: Payer: Self-pay

## 2018-09-16 ENCOUNTER — Other Ambulatory Visit (INDEPENDENT_AMBULATORY_CARE_PROVIDER_SITE_OTHER): Payer: BC Managed Care – PPO

## 2018-09-16 ENCOUNTER — Encounter: Payer: Self-pay | Admitting: Gastroenterology

## 2018-09-16 ENCOUNTER — Ambulatory Visit (INDEPENDENT_AMBULATORY_CARE_PROVIDER_SITE_OTHER): Payer: BC Managed Care – PPO | Admitting: Gastroenterology

## 2018-09-16 ENCOUNTER — Telehealth: Payer: Self-pay | Admitting: Gastroenterology

## 2018-09-16 VITALS — BP 124/70 | HR 88 | Temp 98.2°F | Ht 62.25 in | Wt 124.0 lb

## 2018-09-16 DIAGNOSIS — R7989 Other specified abnormal findings of blood chemistry: Secondary | ICD-10-CM

## 2018-09-16 DIAGNOSIS — Z8711 Personal history of peptic ulcer disease: Secondary | ICD-10-CM

## 2018-09-16 DIAGNOSIS — R945 Abnormal results of liver function studies: Secondary | ICD-10-CM

## 2018-09-16 DIAGNOSIS — Z8719 Personal history of other diseases of the digestive system: Secondary | ICD-10-CM | POA: Diagnosis not present

## 2018-09-16 DIAGNOSIS — R194 Change in bowel habit: Secondary | ICD-10-CM

## 2018-09-16 DIAGNOSIS — Z9884 Bariatric surgery status: Secondary | ICD-10-CM

## 2018-09-16 DIAGNOSIS — R1013 Epigastric pain: Secondary | ICD-10-CM

## 2018-09-16 DIAGNOSIS — R5383 Other fatigue: Secondary | ICD-10-CM

## 2018-09-16 LAB — HEPATIC FUNCTION PANEL
ALT: 35 U/L (ref 0–35)
AST: 31 U/L (ref 0–37)
Albumin: 4.5 g/dL (ref 3.5–5.2)
Alkaline Phosphatase: 63 U/L (ref 39–117)
Bilirubin, Direct: 0 mg/dL (ref 0.0–0.3)
Total Bilirubin: 0.3 mg/dL (ref 0.2–1.2)
Total Protein: 7 g/dL (ref 6.0–8.3)

## 2018-09-16 MED ORDER — SUCRALFATE 1 G PO TABS: 1.0000 g | ORAL_TABLET | Freq: Three times a day (TID) | ORAL | 3 refills | Status: DC | PRN

## 2018-09-16 NOTE — Progress Notes (Signed)
Cost of carafate suspension was too expensive. Switched to tablets- pt to make a slurry.

## 2018-09-16 NOTE — Progress Notes (Signed)
HPI :  53 y/o female here for a follow up visit.   She has a history of a Roux-en-Y in 2007. No problems post-operatively until February of this year when she had perforation of a marginal ulcer requiring laparotomy and patch repair.  Following that in mid June patient admitted with UGIB / anemia.  Nadir hemoglobin 7.6, down from 10.4 in February.  Inpatient EGD revealed 2 clean-based marginal ulcers.  Gastrojejunal anastomosis was friable, had contact bleeding requiring 2 clips for hemostasis. She received IV iron ( no blood transfusion).  She is otherwise had some ongoing postprandial upper abdominal pain.  She has had a right upper quadrant ultrasound which showed no gallstones and a CT scan in June which did not show any complications from her surgery or ulcers.  She has been taking omeprazole 40 mg twice a day, cracking open the capsules prior to ingestion.  She is also been taking liquid Carafate.  Her main complaint is significant fatigue.  She reports she sleeps a lot however chronically has no energy.  She denies any blood in her stools.  Her abdomen has been feeling a bit better but she has some incisional pain from her surgery.  She has decreased appetite but she is able to eat okay.  She does notice that her stools have a "oil slick", and float in the water.  Her surgeon Dr. Hassell Done gave her a trial of Creon which she states did not help at all.  Her prior CT scan showed a normal pancreas.  Labs she has a mild ALT elevation to 53 and AST to 45 a few weeks ago.  She has not had that before.  No history of liver disease noted.  She reports she is compliant with her medications   CT 07/10/18 - IMPRESSION: No acute findings in the abdomen or pelvis. Trace physiologic free fluid in the pelvis. Prior gastric bypass.  No visible complicating feature.  [place] RUQ 07/30/18 - IMPRESSION: Normal study.  EGD 07/11/18 -Two clean based marginal ulcers. - Z-line variable, at the gastroesophageal  junction. Biopsied. - Small hiatal hernia. - Roux-en-Y gastrojejunostomy with gastrojejunal anastomosis characterized by friable mucosa and contact bleeding requiring 2 clips for hemostasis.  Colonoscopy 03/18/2012 - normal exam, repeat in 10 years    Past Medical History:  Diagnosis Date  . Allergy   . Anemia   . Anxiety   . Asthma   . Depression   . GERD (gastroesophageal reflux disease)   . Heart murmur   . Hiatal hernia   . Peptic ulcer   . UTI (urinary tract infection)      Past Surgical History:  Procedure Laterality Date  . ABDOMINAL HYSTERECTOMY     fibroids  . BIOPSY  07/11/2018   Procedure: BIOPSY;  Surgeon: [name], MD;  Location: [place];  Service: Endoscopy;;  . ESOPHAGOGASTRODUODENOSCOPY (EGD) WITH PROPOFOL N/A 07/11/2018   Procedure: ESOPHAGOGASTRODUODENOSCOPY (EGD) WITH PROPOFOL;  Surgeon: [name], MD;  [facility]: [facility] ENDOSCOPY;  Service: Endoscopy;  Laterality: N/A;  . GASTRIC BYPASS  2007  . HEMOSTASIS CLIP PLACEMENT  07/11/2018   Procedure: HEMOSTASIS CLIP PLACEMENT;  Surgeon: [name], MD;  Location: [place];  Service: Endoscopy;;  . LAPAROTOMY N/A 03/08/2018   Procedure: EXPLORATORY LAPAROTOMY, REPAIR AND PATCH CLOSURE OF PERFORATED ULCER;  Surgeon: Georganna Skeans, MD;  Location: [place];  Service: General;  Laterality: N/A;  . TUBAL LIGATION     Family History  Problem Relation Age of  Onset  . Hypertension Father   . Cancer Father        Prostate  . Depression Sister   . Depression Brother   . Diabetes Maternal Grandmother   . Diabetes Maternal Grandfather   . Liver disease Maternal Grandfather   . Cancer Maternal Grandfather        Prostate  . Diabetes Paternal Grandmother   . Diabetes Paternal Grandfather   . Cancer Paternal Grandfather        Lung, Prostate  . Stroke Mother    Social History   Tobacco Use  . Smoking status: Former Smoker    Quit date: 01/24/1987    Years since quitting: 31.6  . Smokeless  tobacco: Never Used  Substance Use Topics  . Alcohol use: Yes    Alcohol/week: 35.0 standard drinks    Types: 35 Cans of beer per week    Comment: Pt states she drinks 5 beers mosts days  . Drug use: No   Current Outpatient Medications  Medication Sig Dispense Refill  . Acetaminophen (TYLENOL 8 HOUR ARTHRITIS PAIN PO) Take 2 tablets by mouth as needed.    . Acetaminophen (TYLENOL PO) Take 1 tablet by mouth as needed (Rapid release tablet).    . ALPRAZolam (XANAX) 1 MG tablet Take 0.5 mg by mouth as needed (before procedures or anxiety).     . Cholecalciferol (VITAMIN D3) 2000 units capsule Take 1 capsule (2,000 Units total) by mouth daily. (Patient taking differently: Take 2,000 Units by mouth daily. Takes 2 gummies a day) 30 capsule 1  . CREON 6000 units CPEP Take 1 capsule by mouth 3 (three) times daily. Max dose 4    . cyanocobalamin (,VITAMIN B-12,) 1000 MCG/ML injection INJECT 1,000 MCG AS DIRECTED EVERY 30 DAYS. 1 mL 2  . cyclobenzaprine (FLEXERIL) 10 MG tablet Take 1 tablet (10 mg total) by mouth 3 (three) times daily as needed. (Patient taking differently: Take 10 mg by mouth as needed for muscle spasms. ) 30 tablet 0  . dextroamphetamine (DEXTROSTAT) 10 MG tablet Take 20 mg by mouth 3 (three) times daily.     . diazepam (VALIUM) 5 MG tablet Take 1 tablet by mouth as needed.    . fluticasone (FLONASE) 50 MCG/ACT nasal spray Place 2 sprays into both nostrils daily. (Patient taking differently: Place 2 sprays into both nostrils daily as needed for allergies or rhinitis. ) 16 g 5  . Homeopathic Products (ALLERGY MEDICINE PO) Take 1 tablet by mouth as needed (Takes the Clortabs. Equate Brand).    . hydrochlorothiazide (HYDRODIURIL) 25 MG tablet Take 1 tablet (25 mg total) by mouth daily. 90 tablet 3  . loperamide (ANTI-DIARRHEAL) 2 MG capsule Take 2 mg by mouth daily as needed.     . nitrofurantoin, macrocrystal-monohydrate, (MACROBID) 100 MG capsule Take 1 capsule (100 mg total) by mouth  2 (two) times daily. 14 capsule 0  . omeprazole (PRILOSEC) 40 MG capsule Take 1 capsule (40 mg total) by mouth 2 (two) times a day. Open capsule and sprinkle on applesauce. 60 capsule 11  . Probiotic Product (ALIGN) CHEW Chew 2 tablets by mouth daily. Align pre and probiotic with vitamain C-take one tablet each Align pre and probiotic with Vitamin b12 for immune and energy-take one tablet each    . rOPINIRole (REQUIP) 1 MG tablet TAKE 1 TABLET (1 MG TOTAL) BY MOUTH AT BEDTIME. 90 tablet 3  . sucralfate (CARAFATE) 1 GM/10ML suspension Take 10 mLs by mouth 2 (two) times daily.    Marland Kitchen  SYRINGE-NEEDLE, DISP, 3 ML (B-D 3CC LUER-LOK SYR 25GX5/8") 25G X 5/8" 3 ML MISC USE AS DIRECTED WITH B-12 INJECTION 16 each 3  . trimethoprim (TRIMPEX) 100 MG tablet Take 1 tablet (100 mg total) by mouth daily. 90 tablet 1  . Vilazodone HCl (VIIBRYD) 20 MG TABS Take 1 tablet by mouth daily. At lunch    . Vilazodone HCl (VIIBRYD) 40 MG TABS Take 40 mg by mouth daily. In the morning     No current facility-administered medications for this visit.    Allergies  Allergen Reactions  . Penicillins Other (See Comments)    Did it involve swelling of the face/tongue/throat, SOB, or low BP? Unk Did it involve sudden or severe rash/hives, skin peeling, or any reaction on the inside of your mouth or nose? Unk Did you need to seek medical attention at a hospital or doctor's office? Unk When did it last happen? Was 53 years old; reaction not recalled If all above answers are "NO", may proceed with cephalosporin use.   . Codeine Itching    Crazy dreams and itching-pt said she can take it with food     Review of Systems: All systems reviewed and negative except where noted in HPI.   Lab Results  Component Value Date   WBC 4.2 08/27/2018   HGB 12.6 08/27/2018   HCT 38.6 08/27/2018   MCV 89 08/27/2018   PLT 226 08/27/2018    Lab Results  Component Value Date   CREATININE 0.55 (L) 08/27/2018   BUN 10 08/27/2018    NA 140 08/27/2018   K 4.7 08/27/2018   CL 103 08/27/2018   CO2 25 08/27/2018    Lab Results  Component Value Date   ALT 35 09/16/2018   AST 31 09/16/2018   ALKPHOS 63 09/16/2018   BILITOT 0.3 09/16/2018     Physical Exam: BP 124/70 (BP Location: Left Arm, Patient Position: Sitting, Cuff Size: Normal)   Pulse 88   Temp 98.2 F (36.8 C)   Ht 5' 2.25" (1.581 m) Comment: height measured without shoes  Wt 124 lb (56.2 kg)   BMI 22.50 kg/m  Constitutional: Pleasant,well-developed, female in no acute distress. HEENT: Normocephalic and atraumatic. Conjunctivae are normal. No scleral icterus. Neck supple.  Cardiovascular: Normal rate, regular rhythm.  Pulmonary/chest: Effort normal and breath sounds normal. No wheezing, rales or rhonchi. Abdominal: Soft, nondistended, midline incisional tenderness, otherwise c/d/i,  There are no masses palpable. No hepatomegaly. Extremities: no edema Lymphadenopathy: No cervical adenopathy noted. Neurological: Alert and oriented to person place and time. Skin: Skin is warm and dry. No rashes noted. Psychiatric: Normal mood and affect. Behavior is normal.   ASSESSMENT AND PLAN: 53 year old female here for reassessment of the following:  History of gastric ulcer / History of gastric bypass / Epigastric pain - patient had an upper GI bleed and was admitted a few months ago, had friable mucosa at the anastomosis leading to placement of hemostasis clips, along with 2 marginal ulcers.  Now on high-dose PPI and liquid Carafate.  Her hemoglobin has normalized which is excellent news.  She still has ongoing upper abdominal discomfort after she eats however.  I am recommending an upper endoscopy to further evaluate and ensure healing of the ulcers.  I discussed risks and benefits with her.  She is agreeable to proceed however she reports that Dr. Hassell Done of surgery had personally wanted to do it given her history of surgery with him. I will touch base with him to  see if that is the case, and then discuss where she should have this scheduled.  We will continue her high-dose PPI capsules, break open prior to ingestion, and Carafate in the interim.  Change in bowel habits - sounds like she has steatorrhea.  Trial of Creon has provided no benefit thus far.  I asked her to stop this and send stool for fecal elastase to clarify if she has pancreatic exocrine deficiency.  She agreed to do this.  Prior CT scan shows no abnormalities of the pancreas or evidence of chronic pancreatitis.  Elevated ALT - will repeat to see if this is persistent and further workup if needed. One mild elevation. Will otherwise screen for hep C and hep B.   Fatigue - she will follow up with her PCP to further evaluate this issue, unclear what is driving it. Her Hgb has normalized.   [place] Cellar, MD [facility], [facility]

## 2018-09-16 NOTE — Patient Instructions (Addendum)
If you are age 53 or older, your body mass index should be between 23-30. Your Body mass index is 22.5 kg/m. If this is out of the aforementioned range listed, please consider follow up with your Primary Care Provider.  If you are age 47 or younger, your body mass index should be between 19-25. Your Body mass index is 22.5 kg/m. If this is out of the aformentioned range listed, please consider follow up with your Primary Care Provider.   To help prevent the possible spread of infection to our patients, communities, and staff; we will be implementing the following measures:  As of now we are not allowing any visitors/family members to accompany you to any upcoming appointments with [facility]. If you have any concerns about this please contact our office to discuss prior to the appointment.   Please go to the lab in the basement of our building to have lab work done as you leave today. Hit "B" for basement when you get on the elevator.  When the doors open the lab is on your left.  We will call you with the results. Thank you.  Stop taking Creon until further notice.   Thank you for entrusting me with your care and for choosing [facility], Dr. Juana Di­az Cellar

## 2018-09-17 LAB — HEPATITIS B SURFACE ANTIGEN: Hepatitis B Surface Ag: NEGATIVE

## 2018-09-17 LAB — HEPATITIS C ANTIBODY: Hep C Virus Ab: <0.1 s/co ratio (ref 0.0–0.9)

## 2018-10-01 ENCOUNTER — Other Ambulatory Visit: Payer: BC Managed Care – PPO

## 2018-10-01 DIAGNOSIS — R1013 Epigastric pain: Secondary | ICD-10-CM | POA: Diagnosis not present

## 2018-10-01 DIAGNOSIS — R194 Change in bowel habit: Secondary | ICD-10-CM

## 2018-10-01 DIAGNOSIS — Z9884 Bariatric surgery status: Secondary | ICD-10-CM

## 2018-10-01 DIAGNOSIS — Z8719 Personal history of other diseases of the digestive system: Secondary | ICD-10-CM

## 2018-10-01 DIAGNOSIS — Z8711 Personal history of peptic ulcer disease: Secondary | ICD-10-CM

## 2018-10-01 DIAGNOSIS — R7989 Other specified abnormal findings of blood chemistry: Secondary | ICD-10-CM

## 2018-10-05 ENCOUNTER — Other Ambulatory Visit: Payer: Self-pay | Admitting: Family Medicine

## 2018-10-10 ENCOUNTER — Other Ambulatory Visit: Payer: Self-pay

## 2018-10-10 DIAGNOSIS — R1013 Epigastric pain: Secondary | ICD-10-CM

## 2018-10-10 DIAGNOSIS — Z8711 Personal history of peptic ulcer disease: Secondary | ICD-10-CM

## 2018-10-10 DIAGNOSIS — Z8719 Personal history of other diseases of the digestive system: Secondary | ICD-10-CM

## 2018-10-10 LAB — PANCREATIC ELASTASE, FECAL: Pancreatic Elastase, Fecal: 189 ug Elast./g — ABNORMAL LOW (ref >200)

## 2018-10-10 NOTE — Progress Notes (Signed)
Mailed instructions to pt for procedure on 9-24

## 2018-10-11 ENCOUNTER — Other Ambulatory Visit: Payer: Self-pay

## 2018-10-11 ENCOUNTER — Other Ambulatory Visit: Payer: Self-pay | Admitting: *Deleted

## 2018-10-11 ENCOUNTER — Ambulatory Visit: Payer: BC Managed Care – PPO

## 2018-10-11 ENCOUNTER — Telehealth: Payer: Self-pay | Admitting: Gastroenterology

## 2018-10-11 ENCOUNTER — Encounter: Payer: Self-pay | Admitting: Family Medicine

## 2018-10-11 MED ORDER — DICLOFENAC SODIUM 2 % TD SOLN: TRANSDERMAL | 1 refills | Status: AC

## 2018-10-11 MED ORDER — CREON 24000-76000 UNITS PO CPEP: 30000.0000 [IU] | ORAL_CAPSULE | Freq: Three times a day (TID) | ORAL | 0 refills | Status: DC

## 2018-10-11 NOTE — Telephone Encounter (Signed)
Pt stated that prescription for Creon instructed to take 1.25 c, but she cannot split a capsule.

## 2018-10-11 NOTE — Telephone Encounter (Signed)
Esther sent a note to [facility] asking him to clarify dose, he has not answered yet.

## 2018-10-11 NOTE — Telephone Encounter (Signed)
Please clarify doing for Creon. Creon is capsules and cannot be cut in to havles or quarters.

## 2018-10-14 ENCOUNTER — Telehealth: Payer: Self-pay

## 2018-10-14 ENCOUNTER — Other Ambulatory Visit: Payer: Self-pay

## 2018-10-14 MED ORDER — CREON 24000-76000 UNITS PO CPEP: 24000.0000 [IU] | ORAL_CAPSULE | Freq: Three times a day (TID) | ORAL | 3 refills | Status: DC

## 2018-10-14 NOTE — Telephone Encounter (Signed)
Left message for patient to please call back (ref. Change in Creon dosing)

## 2018-10-14 NOTE — Telephone Encounter (Signed)
Patient called back and I gave her instructions on taking the new dose of Creon.

## 2018-10-14 NOTE — Progress Notes (Signed)
crea

## 2018-10-15 ENCOUNTER — Encounter: Payer: Self-pay | Admitting: Gastroenterology

## 2018-10-16 ENCOUNTER — Telehealth: Payer: Self-pay

## 2018-10-16 NOTE — Telephone Encounter (Signed)
Covid-19 screening questions   Do you now or have you had a fever in the last 14 days?  Do you have any respiratory symptoms of shortness of breath or cough now or in the last 14 days?  Do you have any family members or close contacts with diagnosed or suspected Covid-19 in the past 14 days?  Have you been tested for Covid-19 and found to be positive?       

## 2018-10-17 ENCOUNTER — Ambulatory Visit (AMBULATORY_SURGERY_CENTER): Payer: BC Managed Care – PPO | Admitting: Gastroenterology

## 2018-10-17 ENCOUNTER — Other Ambulatory Visit: Payer: Self-pay

## 2018-10-17 ENCOUNTER — Encounter: Payer: Self-pay | Admitting: Gastroenterology

## 2018-10-17 VITALS — BP 100/65 | HR 67 | Temp 98.2°F | Resp 11 | Ht 62.0 in | Wt 124.0 lb

## 2018-10-17 DIAGNOSIS — K208 Other esophagitis: Secondary | ICD-10-CM | POA: Diagnosis not present

## 2018-10-17 DIAGNOSIS — R1013 Epigastric pain: Secondary | ICD-10-CM

## 2018-10-17 DIAGNOSIS — K279 Peptic ulcer, site unspecified, unspecified as acute or chronic, without hemorrhage or perforation: Secondary | ICD-10-CM | POA: Diagnosis not present

## 2018-10-17 DIAGNOSIS — K289 Gastrojejunal ulcer, unspecified as acute or chronic, without hemorrhage or perforation: Secondary | ICD-10-CM

## 2018-10-17 DIAGNOSIS — K219 Gastro-esophageal reflux disease without esophagitis: Secondary | ICD-10-CM | POA: Diagnosis not present

## 2018-10-17 DIAGNOSIS — K2970 Gastritis, unspecified, without bleeding: Secondary | ICD-10-CM | POA: Diagnosis not present

## 2018-10-17 DIAGNOSIS — Z9884 Bariatric surgery status: Secondary | ICD-10-CM | POA: Diagnosis not present

## 2018-10-17 DIAGNOSIS — K2950 Unspecified chronic gastritis without bleeding: Secondary | ICD-10-CM | POA: Diagnosis not present

## 2018-10-17 DIAGNOSIS — K449 Diaphragmatic hernia without obstruction or gangrene: Secondary | ICD-10-CM

## 2018-10-17 MED ORDER — SODIUM CHLORIDE 0.9 % IV SOLN: 500.0000 mL | Freq: Once | INTRAVENOUS | Status: DC

## 2018-10-17 NOTE — Progress Notes (Signed)
Called to room to assist during endoscopic procedure.  Patient ID and intended procedure confirmed with present staff. Received instructions for my participation in the procedure from the performing physician.  

## 2018-10-17 NOTE — Op Note (Signed)
[facility] Patient Name: [name] Procedure Date: 10/17/2018 10:04 AM MRN: [id number] Endoscopist: Remo Lipps P. Havery Moros , MD Age: 53 Referring MD:  Date of Birth: October 28, 1965 Gender: Female Account #: [id number] Procedure:                Upper GI endoscopy Indications:              Follow-up of gastrojejunal ulcer with hemorrhage,                            history of gastric bypass, epigastric pain - on                            omeprazole capsules (cracked prior to ingestion) +                            Carafate Medicines:                Monitored Anesthesia Care Procedure:                Pre-Anesthesia Assessment:                           - Prior to the procedure, a History and Physical                            was performed, and patient medications and                            allergies were reviewed. The patient's tolerance of                            previous anesthesia was also reviewed. The risks                            and benefits of the procedure and the sedation                            options and risks were discussed with the patient.                            All questions were answered, and informed consent                            was obtained. Prior Anticoagulants: The patient has                            taken no previous anticoagulant or antiplatelet                            agents. ASA Grade Assessment: II - A patient with                            mild systemic disease. After reviewing the risks  and benefits, the patient was deemed in                            satisfactory condition to undergo the procedure.                           After obtaining informed consent, the endoscope was                            passed under direct vision. Throughout the                            procedure, the patient's blood pressure, pulse, and                            oxygen saturations were monitored  continuously. The                            Endoscope was introduced through the mouth, and                            advanced to the jejunum. The upper GI endoscopy was                            accomplished without difficulty. The patient                            tolerated the procedure well. Scope In: Scope Out: Findings:                 Esophagogastric landmarks were identified: the                            Z-line was found at 37 cm, the gastroesophageal                            junction was found at 38 cm and the upper extent of                            the gastric folds was found at 39 cm from the                            incisors.                           A 1 cm hiatal hernia was present.                           The Z-line was irregular with a roughly 8-53mm                            tongue of possible Barrett's esophagus. Biopsies                            were taken  with a cold forceps for histology.                           The exam of the esophagus was otherwise normal.                           Evidence of a Roux-en-Y gastrojejunostomy was                            found. The gastrojejunal anastomosis was                            characterized by healthy appearing mucosa. There                            was one adherent clip at the previously treated                            ulcer. The marginal ulcers have completely healed.                            No erosions / ulcerations noted anywhere. Clip not                            removed given this will induce bleeding and still                            may fall off on it's own with more time. A small                            aspect of a buried staple was noted without                            ulceration. I elected not to remove it as I don't                            think it is causing her symptoms.                           The gastric pouch was normal. Biopsies were taken                             with a cold forceps for Helicobacter pylori testing.                           The examined small bowel limbs were normal. Complications:            No immediate complications. Estimated blood loss:                            Minimal. Estimated Blood Loss:     Estimated blood loss was minimal. Impression:               - Esophagogastric landmarks identified.                           -  1 cm hiatal hernia.                           - Z-line irregular. Biopsied.                           - Roux-en-Y gastrojejunostomy with gastrojejunal                            anastomosis characterized by healthy appearing                            mucosa - healed ulcerations with residual clip that                            remains and very small aspect of a buried staple -                            both without ulceration. I did not remove either at                            this time, I'm not convinced this is causing her                            symptoms but can consider a repeat exam in another                            few months for reassessment if symptoms persist.                           - Normal gastric pouch. Biopsied to rule out H                            pylori.                           - Normal examined small bowel limbs. Recommendation:           - Patient has a contact number available for                            emergencies. The signs and symptoms of potential                            delayed complications were discussed with the                            patient. Return to normal activities tomorrow.                            Written discharge instructions were provided to the                            patient.                           -  Resume previous diet.                           - Continue present medications.                           - Await pathology results. Remo Lipps P. Havery Moros, MD 10/17/2018 10:32:22 AM This report has been signed electronically.

## 2018-10-17 NOTE — Progress Notes (Signed)
Pt's states no medical or surgical changes since previsit or office visit.  Covid- [place]

## 2018-10-17 NOTE — Progress Notes (Signed)
Report given to PACU, vss 

## 2018-10-17 NOTE — Patient Instructions (Signed)
Read all of the handouts given to you by your recovery room nurse.    YOU HAD AN ENDOSCOPIC PROCEDURE TODAY AT [facility]:   Refer to the procedure report that was given to you for any specific questions about what was found during the examination.  If the procedure report does not answer your questions, please call your gastroenterologist to clarify.  If you requested that your care partner not be given the details of your procedure findings, then the procedure report has been included in a sealed envelope for you to review at your convenience later.  YOU SHOULD EXPECT: Some feelings of bloating in the abdomen. Passage of more gas than usual.  Walking can help get rid of the air that was put into your GI tract during the procedure and reduce the bloating.   Please Note:  You might notice some irritation and congestion in your nose or some drainage.  This is from the oxygen used during your procedure.  There is no need for concern and it should clear up in a day or so.  SYMPTOMS TO REPORT IMMEDIATELY:   Following upper endoscopy (EGD)  Vomiting of blood or coffee ground material  New chest pain or pain under the shoulder blades  Painful or persistently difficult swallowing  New shortness of breath  Fever of 100F or higher  Black, tarry-looking stools  For urgent or emergent issues, a gastroenterologist can be reached at any hour by calling [phone].   DIET:  We do recommend a small meal at first, but then you may proceed to your regular diet.  Drink plenty of fluids but you should avoid alcoholic beverages for 24 hours. Please read all of the handouts regarding reflux.  It will tell you which foods could irritate your stomach and esophagus.  ACTIVITY:  You should plan to take it easy for the rest of today and you should NOT DRIVE or use heavy machinery until tomorrow (because of the sedation medicines used during the test).    FOLLOW UP: Our staff will call the  number listed on your records 48-72 hours following your procedure to check on you and address any questions or concerns that you may have regarding the information given to you following your procedure. If we do not reach you, we will leave a message.  We will attempt to reach you two times.  During this call, we will ask if you have developed any symptoms of COVID 19. If you develop any symptoms (ie: fever, flu-like symptoms, shortness of breath, cough etc.) before then, please call [phone].  If you test positive for Covid 19 in the 2 weeks post procedure, please call and report this information to [place].    If any biopsies were taken you will be contacted by phone or by letter within the next 1-3 weeks.  Please call us at [phone] if you have not heard about the biopsies in 3 weeks.    SIGNATURES/CONFIDENTIALITY: You and/or your care partner have signed paperwork which will be entered into your electronic medical record.  These signatures attest to the fact that that the information above on your After Visit Summary has been reviewed and is understood.  Full responsibility of the confidentiality of this discharge information lies with you and/or your care-partner.

## 2018-10-21 ENCOUNTER — Telehealth: Payer: Self-pay

## 2018-10-21 NOTE — Telephone Encounter (Signed)
Left message on 2nd follow up call. 

## 2018-10-21 NOTE — Telephone Encounter (Signed)
Patient called back and said that she is still waking up in the mornings horse and her throat is sore. She said her stomach is waking her up during the night as well. Requested a call back to discuss this.

## 2018-10-21 NOTE — Telephone Encounter (Signed)
Attempted to reach pt. With follow-up call following endoscopic procedure 10/17/2018.  LM on pt. Voice mail.  Will try to reach pt. Again later today.

## 2018-10-24 DIAGNOSIS — M2578 Osteophyte, vertebrae: Secondary | ICD-10-CM | POA: Diagnosis not present

## 2018-10-24 DIAGNOSIS — M4802 Spinal stenosis, cervical region: Secondary | ICD-10-CM | POA: Diagnosis not present

## 2018-10-24 DIAGNOSIS — N390 Urinary tract infection, site not specified: Secondary | ICD-10-CM | POA: Insufficient documentation

## 2018-10-24 DIAGNOSIS — M5080 Other cervical disc disorders, unspecified cervical region: Secondary | ICD-10-CM | POA: Diagnosis not present

## 2018-10-24 DIAGNOSIS — M47812 Spondylosis without myelopathy or radiculopathy, cervical region: Secondary | ICD-10-CM | POA: Diagnosis not present

## 2018-10-24 DIAGNOSIS — N951 Menopausal and female climacteric states: Secondary | ICD-10-CM | POA: Insufficient documentation

## 2018-10-24 DIAGNOSIS — M5031 Other cervical disc degeneration,  high cervical region: Secondary | ICD-10-CM | POA: Diagnosis not present

## 2018-10-24 DIAGNOSIS — R928 Other abnormal and inconclusive findings on diagnostic imaging of breast: Secondary | ICD-10-CM | POA: Insufficient documentation

## 2018-10-24 DIAGNOSIS — M4312 Spondylolisthesis, cervical region: Secondary | ICD-10-CM | POA: Diagnosis not present

## 2018-10-29 ENCOUNTER — Telehealth: Payer: Self-pay | Admitting: Gastroenterology

## 2018-10-29 NOTE — Telephone Encounter (Signed)
Pt inquired about results of colonoscopy.  

## 2018-10-29 NOTE — Telephone Encounter (Signed)
Thanks Customer service manager. Agree she should try 2 capsules of the creon at a time and see if that works better. Thanks

## 2018-10-29 NOTE — Telephone Encounter (Signed)
Called patient and gave EGD results, she couldn't open it up on her My Chart.  She said she is going to need to go to the 2 tabs on her Creon because she is not seeing any improvement yet. Also says her stools are mussy.

## 2018-12-06 ENCOUNTER — Other Ambulatory Visit: Payer: Self-pay | Admitting: Family Medicine

## 2018-12-06 DIAGNOSIS — E538 Deficiency of other specified B group vitamins: Secondary | ICD-10-CM

## 2018-12-13 ENCOUNTER — Telehealth: Payer: Self-pay | Admitting: Gastroenterology

## 2018-12-13 NOTE — Telephone Encounter (Signed)
Thanks Customer service manager. Sounds like legs are pretty swollen but this may be unlikely related to her GI issues, hard to say without seeing her. I see she is taking HCTZ, is she taking that already? She should have CMET to ensure renal function / albumin okay if you can coordinate that, but she should also touch base with her PCP about further workup for this. Thanks

## 2018-12-13 NOTE — Telephone Encounter (Signed)
Patient is calling because she is stating that her legs are swelling really bad- feet are red and sore and achy feeling. The medication creon- she is taking 2 of them 3 or 4 times a day. She wanted to come in and see Dr. Havery Moros but since he is booked out she wanted to see what could be causing it and what she could do.

## 2018-12-16 ENCOUNTER — Other Ambulatory Visit: Payer: Self-pay

## 2018-12-16 DIAGNOSIS — R600 Localized edema: Secondary | ICD-10-CM

## 2018-12-16 NOTE — Telephone Encounter (Signed)
Thanks Esther 

## 2018-12-16 NOTE — Telephone Encounter (Signed)
Called patient and she states she has not been taking the HCTZ for about 3 weeks. That her PCP had her try it to see if it would increase her urinary output, and it didn't so she stopped it. She agreed to come in tomorrow for CMET. And I told her Dr. Havery Moros would also like her to follow-up with her PCP about her edema and symptoms in her legs.

## 2018-12-17 ENCOUNTER — Other Ambulatory Visit (INDEPENDENT_AMBULATORY_CARE_PROVIDER_SITE_OTHER): Payer: BC Managed Care – PPO

## 2018-12-17 DIAGNOSIS — Z780 Asymptomatic menopausal state: Secondary | ICD-10-CM | POA: Diagnosis not present

## 2018-12-17 DIAGNOSIS — Z130 Encounter for screening for diseases of the blood and blood-forming organs and certain disorders involving the immune mechanism: Secondary | ICD-10-CM | POA: Diagnosis not present

## 2018-12-17 DIAGNOSIS — R600 Localized edema: Secondary | ICD-10-CM | POA: Diagnosis not present

## 2018-12-17 DIAGNOSIS — Z6822 Body mass index (BMI) 22.0-22.9, adult: Secondary | ICD-10-CM | POA: Diagnosis not present

## 2018-12-17 DIAGNOSIS — Z01419 Encounter for gynecological examination (general) (routine) without abnormal findings: Secondary | ICD-10-CM | POA: Diagnosis not present

## 2018-12-17 DIAGNOSIS — R3121 Asymptomatic microscopic hematuria: Secondary | ICD-10-CM | POA: Diagnosis not present

## 2018-12-17 LAB — COMPREHENSIVE METABOLIC PANEL
ALT: 39 U/L — ABNORMAL HIGH (ref 0–35)
AST: 34 U/L (ref 0–37)
Albumin: 4.1 g/dL (ref 3.5–5.2)
Alkaline Phosphatase: 66 U/L (ref 39–117)
BUN: 13 mg/dL (ref 6–23)
CO2: 31 mEq/L (ref 19–32)
Calcium: 9.6 mg/dL (ref 8.4–10.5)
Chloride: 103 mEq/L (ref 96–112)
Creatinine, Ser: 0.7 mg/dL (ref 0.40–1.20)
GFR: 87.3 mL/min (ref >60.00)
Glucose, Bld: 96 mg/dL (ref 70–99)
Potassium: 5.1 mEq/L (ref 3.5–5.1)
Sodium: 139 mEq/L (ref 135–145)
Total Bilirubin: 0.4 mg/dL (ref 0.2–1.2)
Total Protein: 6.8 g/dL (ref 6.0–8.3)

## 2019-01-27 ENCOUNTER — Other Ambulatory Visit: Payer: Self-pay | Admitting: Gastroenterology

## 2019-03-03 ENCOUNTER — Other Ambulatory Visit: Payer: Self-pay

## 2019-03-03 ENCOUNTER — Ambulatory Visit (INDEPENDENT_AMBULATORY_CARE_PROVIDER_SITE_OTHER): Payer: BC Managed Care – PPO | Admitting: Family Medicine

## 2019-03-03 ENCOUNTER — Other Ambulatory Visit: Payer: Self-pay | Admitting: Family Medicine

## 2019-03-03 ENCOUNTER — Encounter: Payer: Self-pay | Admitting: Family Medicine

## 2019-03-03 VITALS — BP 110/70 | HR 77 | Temp 97.5°F | Ht 62.0 in

## 2019-03-03 DIAGNOSIS — Z20828 Contact with and (suspected) exposure to other viral communicable diseases: Secondary | ICD-10-CM | POA: Diagnosis not present

## 2019-03-03 DIAGNOSIS — K591 Functional diarrhea: Secondary | ICD-10-CM | POA: Diagnosis not present

## 2019-03-03 DIAGNOSIS — E538 Deficiency of other specified B group vitamins: Secondary | ICD-10-CM

## 2019-03-03 DIAGNOSIS — J0140 Acute pansinusitis, unspecified: Secondary | ICD-10-CM

## 2019-03-03 DIAGNOSIS — J40 Bronchitis, not specified as acute or chronic: Secondary | ICD-10-CM | POA: Diagnosis not present

## 2019-03-03 MED ORDER — PREDNISONE 10 MG PO TABS: ORAL_TABLET | ORAL | 0 refills | Status: DC

## 2019-03-03 MED ORDER — AZITHROMYCIN 250 MG PO TABS: ORAL_TABLET | ORAL | 0 refills | Status: DC

## 2019-03-03 MED ORDER — PROMETHAZINE-DM 6.25-15 MG/5ML PO SYRP: 5.0000 mL | ORAL_SOLUTION | Freq: Four times a day (QID) | ORAL | 0 refills | Status: DC | PRN

## 2019-03-03 NOTE — Progress Notes (Signed)
Virtual Visit via Video Note  I connected with [name] on 03/03/19 at 11:00 AM EST by a video enabled telemedicine application and verified that I am speaking with the correct person using two identifiers.  Location: Patient: home  Provider: office    I discussed the limitations of evaluation and management by telemedicine and the availability of in person appointments. The patient expressed understanding and agreed to proceed.  History of Present Illness: Pt is home c/o headache x 1 week--- by last Tuesday she had sinus congestion and sore throat    ? Fever, + chills  + cough--  Productive  Mucus is green, yellow    Observations/Objective: Vitals:   03/03/19 1057  BP: 110/70  Pulse: 77  Temp: (!) 97.5 F (36.4 C)   Pt is in NAD but does not feel well   Assessment and Plan: 1. Bronchitis pred taper, cough med and abx covid test  - promethazine-dextromethorphan (PROMETHAZINE-DM) 6.25-15 MG/5ML syrup; Take 5 mLs by mouth 4 (four) times daily as needed.  Dispense: 118 mL; Refill: 0 - azithromycin (ZITHROMAX Z-PAK) 250 MG tablet; As directed  Dispense: 6 each; Refill: 0 - predniSONE (DELTASONE) 10 MG tablet; TAKE 3 TABLETS PO QD FOR 3 DAYS THEN TAKE 2 TABLETS PO QD FOR 3 DAYS THEN TAKE 1 TABLET PO QD FOR 3 DAYS THEN TAKE 1/2 TAB PO QD FOR 3 DAYS  Dispense: 20 tablet; Refill: 0  2. Acute non-recurrent pansinusitis See above - azithromycin (ZITHROMAX Z-PAK) 250 MG tablet; As directed  Dispense: 6 each; Refill: 0 - predniSONE (DELTASONE) 10 MG tablet; TAKE 3 TABLETS PO QD FOR 3 DAYS THEN TAKE 2 TABLETS PO QD FOR 3 DAYS THEN TAKE 1 TABLET PO QD FOR 3 DAYS THEN TAKE 1/2 TAB PO QD FOR 3 DAYS  Dispense: 20 tablet; Refill: 0   Follow Up Instructions:    I discussed the assessment and treatment plan with the patient. The patient was provided an opportunity to ask questions and all were answered. The patient agreed with the plan and demonstrated an understanding of the  instructions.   The patient was advised to call back or seek an in-person evaluation if the symptoms worsen or if the condition fails to improve as anticipated.  I provided 25 minutes of non-face-to-face time during this encounter.   [name], DO

## 2019-03-10 ENCOUNTER — Encounter: Payer: Self-pay | Admitting: Family Medicine

## 2019-03-10 DIAGNOSIS — J40 Bronchitis, not specified as acute or chronic: Secondary | ICD-10-CM

## 2019-03-10 NOTE — Telephone Encounter (Signed)
Ok to give extended note

## 2019-03-11 MED ORDER — PROMETHAZINE-DM 6.25-15 MG/5ML PO SYRP: 5.0000 mL | ORAL_SOLUTION | Freq: Four times a day (QID) | ORAL | 0 refills | Status: DC | PRN

## 2019-03-11 NOTE — Addendum Note (Signed)
Addended by: Sanda Linger on: 03/11/2019 02:35 PM   Modules accepted: Orders

## 2019-03-11 NOTE — Telephone Encounter (Addendum)
Caller Name: Sumie Nesta Phone: [phone]  1. Pt is out of cough medication. She still has a cough, green mucus, scratchy voice. She is requesting to have more promethazine-dextromethorphan (PROMETHAZINE-DM) 6.25-15 MG/5ML syrup.  Pharmacy: [id number] - [place], [place] - [address] Phone:  [phone]  Fax:  [phone]      2. Pt is needing work note to send to employer as she was supposed to return to work 03/10/2019. Please change to the return to work date to 03/17/2019. Ok to send thru [place].

## 2019-04-04 ENCOUNTER — Other Ambulatory Visit: Payer: Self-pay

## 2019-04-07 ENCOUNTER — Ambulatory Visit (INDEPENDENT_AMBULATORY_CARE_PROVIDER_SITE_OTHER): Payer: BC Managed Care – PPO | Admitting: Family Medicine

## 2019-04-07 ENCOUNTER — Encounter: Payer: Self-pay | Admitting: Family Medicine

## 2019-04-07 ENCOUNTER — Other Ambulatory Visit: Payer: Self-pay

## 2019-04-07 VITALS — BP 110/70 | HR 66 | Temp 97.0°F | Resp 18 | Ht 62.0 in | Wt 131.4 lb

## 2019-04-07 DIAGNOSIS — M79604 Pain in right leg: Secondary | ICD-10-CM | POA: Diagnosis not present

## 2019-04-07 DIAGNOSIS — M79605 Pain in left leg: Secondary | ICD-10-CM

## 2019-04-07 DIAGNOSIS — M79601 Pain in right arm: Secondary | ICD-10-CM | POA: Diagnosis not present

## 2019-04-07 DIAGNOSIS — R200 Anesthesia of skin: Secondary | ICD-10-CM | POA: Diagnosis not present

## 2019-04-07 DIAGNOSIS — R600 Localized edema: Secondary | ICD-10-CM

## 2019-04-07 LAB — B12 AND FOLATE PANEL
Folate: >23.7 ng/mL (ref >5.9)
Vitamin B-12: 1229 pg/mL — ABNORMAL HIGH (ref 211–911)

## 2019-04-07 LAB — COMPREHENSIVE METABOLIC PANEL
ALT: 14 U/L (ref 0–35)
AST: 22 U/L (ref 0–37)
Albumin: 3.8 g/dL (ref 3.5–5.2)
Alkaline Phosphatase: 48 U/L (ref 39–117)
BUN: 14 mg/dL (ref 6–23)
CO2: 31 mEq/L (ref 19–32)
Calcium: 8.8 mg/dL (ref 8.4–10.5)
Chloride: 101 mEq/L (ref 96–112)
Creatinine, Ser: 0.63 mg/dL (ref 0.40–1.20)
GFR: 98.48 mL/min (ref >60.00)
Glucose, Bld: 88 mg/dL (ref 70–99)
Potassium: 4.3 mEq/L (ref 3.5–5.1)
Sodium: 138 mEq/L (ref 135–145)
Total Bilirubin: 0.7 mg/dL (ref 0.2–1.2)
Total Protein: 6 g/dL (ref 6.0–8.3)

## 2019-04-07 LAB — TSH: TSH: 1.91 u[IU]/mL (ref 0.35–4.50)

## 2019-04-07 LAB — VITAMIN D 25 HYDROXY (VIT D DEFICIENCY, FRACTURES): VITD: 38.98 ng/mL (ref 30.00–100.00)

## 2019-04-07 MED ORDER — FUROSEMIDE 40 MG PO TABS: 40.0000 mg | ORAL_TABLET | Freq: Every day | ORAL | 3 refills | Status: DC

## 2019-04-07 NOTE — Assessment & Plan Note (Signed)
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

## 2019-04-07 NOTE — Patient Instructions (Signed)

## 2019-04-07 NOTE — Assessment & Plan Note (Signed)
Check [place] R/o dvt  If swelling con't consider vascular

## 2019-04-07 NOTE — Progress Notes (Signed)
Patient ID: [name], female    DOB: 05-13-1965  Age: 54 y.o. MRN: [id number]    Subjective:  Subjective  HPI  [name] presents for b/l hand numbness.  She saw 2 neuro surgeons who said the only thing they could do was extensive spinal surgery.   Hands have been going numb-- she states she did discuss this with the NS.  Pt also c/o painful spot on R arm and swelling as well as swelling in low ext to thigh.  She had pictures to show me because today they are not swollen .   Legs also ache/ hurt when swollen   The hctz did nothing.   Review of Systems  Constitutional: Negative for appetite change, diaphoresis, fatigue and unexpected weight change.  Eyes: Negative for pain, redness and visual disturbance.  Respiratory: Negative for cough, chest tightness, shortness of breath and wheezing.   Cardiovascular: Positive for leg swelling. Negative for chest pain and palpitations.  Endocrine: Negative for cold intolerance, heat intolerance, polydipsia, polyphagia and polyuria.  Genitourinary: Negative for difficulty urinating, dysuria and frequency.  Skin: Negative for color change, pallor, rash and wound.  Neurological: Negative for dizziness, light-headedness, numbness and headaches.    History Past Medical History:  Diagnosis Date  . Allergy   . Anemia   . Anxiety   . Asthma   . Depression   . GERD (gastroesophageal reflux disease)   . Heart murmur   . Hiatal hernia   . Peptic ulcer   . UTI (urinary tract infection)     She has a past surgical history that includes Abdominal hysterectomy; Gastric bypass (2007); Tubal ligation; laparotomy (N/A, 03/08/2018); Esophagogastroduodenoscopy (egd) with propofol (N/A, 07/11/2018); biopsy (07/11/2018); and Hemostasis clip placement (07/11/2018).   Her family history includes Cancer in her father, maternal grandfather, and paternal grandfather; Depression in her brother and sister; Diabetes in her maternal grandfather,  maternal grandmother, paternal grandfather, and paternal grandmother; Hypertension in her father; Liver disease in her maternal grandfather; Stroke in her mother.She reports that she quit smoking about 32 years ago. She has never used smokeless tobacco. She reports current alcohol use of about 35.0 standard drinks of alcohol per week. She reports that she does not use drugs.  Current Outpatient Medications on File Prior to Visit  Medication Sig Dispense Refill  . Acetaminophen (TYLENOL 8 HOUR ARTHRITIS PAIN PO) Take 2 tablets by mouth as needed.    . Acetaminophen (TYLENOL PO) Take 1 tablet by mouth as needed (Rapid release tablet).    . ALPRAZolam (XANAX) 1 MG tablet Take 0.5 mg by mouth as needed (before procedures or anxiety).     . Cholecalciferol (VITAMIN D3) 2000 units capsule Take 1 capsule (2,000 Units total) by mouth daily. (Patient taking differently: Take 2,000 Units by mouth daily. Takes 2 gummies a day) 30 capsule 1  . cyanocobalamin (,VITAMIN B-12,) 1000 MCG/ML injection INJECT 1,000 MCG AS DIRECTED EVERY 30 DAYS. 3 mL 1  . cyclobenzaprine (FLEXERIL) 10 MG tablet Take 1 tablet (10 mg total) by mouth 3 (three) times daily as needed. (Patient taking differently: Take 10 mg by mouth as needed for muscle spasms. ) 30 tablet 0  . dextroamphetamine (DEXTROSTAT) 10 MG tablet Take 20 mg by mouth 3 (three) times daily.     . diazepam (VALIUM) 5 MG tablet Take 1 tablet by mouth as needed.    . Diclofenac Sodium 2 % SOLN Apply 1 pump twice daily as needed.  112 g 1  . fluticasone (FLONASE) 50 MCG/ACT nasal spray Place 2 sprays into both nostrils daily. (Patient taking differently: Place 2 sprays into both nostrils daily as needed for allergies or rhinitis. ) 16 g 5  . Homeopathic Products (ALLERGY MEDICINE PO) Take 1 tablet by mouth as needed (Takes the Clortabs. Equate Brand).    . loperamide (ANTI-DIARRHEAL) 2 MG capsule Take 2 mg by mouth daily as needed.     Marland Kitchen omeprazole (PRILOSEC) 40 MG  capsule Take 1 capsule (40 mg total) by mouth 2 (two) times a day. Open capsule and sprinkle on applesauce. 60 capsule 11  . Pancrelipase, Lip-Prot-Amyl, (CREON) 24000-76000 units CPEP Take 2 capsules (48,000 Units total) by mouth 3 (three) times daily before meals. Take 2 capsules WITH meals and 1 capsule WITH a snack 240 capsule 3  . Probiotic Product (ALIGN) CHEW Chew 2 tablets by mouth daily. Align pre and probiotic with vitamain C-take one tablet each Align pre and probiotic with Vitamin b12 for immune and energy-take one tablet each    . rOPINIRole (REQUIP) 1 MG tablet TAKE 1 TABLET (1 MG TOTAL) BY MOUTH AT BEDTIME. 90 tablet 3  . sucralfate (CARAFATE) 1 g tablet Take 1 tablet (1 g total) by mouth every 8 (eight) hours as needed. Slowly dissolve tablet in 1 tablespoon of distilled water before taking 60 tablet 3  . SYRINGE-NEEDLE, DISP, 3 ML (B-D 3CC LUER-LOK SYR 25GX5/8") 25G X 5/8" 3 ML MISC USE AS DIRECTED WITH B-12 INJECTION 16 each 3  . trimethoprim (TRIMPEX) 100 MG tablet Take 1 tablet (100 mg total) by mouth daily. 90 tablet 1  . Vilazodone HCl (VIIBRYD) 20 MG TABS Take 1 tablet by mouth daily. At lunch    . Vilazodone HCl (VIIBRYD) 40 MG TABS Take 40 mg by mouth daily. In the morning    . [DISCONTINUED] Azelastine-Fluticasone (DYMISTA) 137-50 MCG/ACT SUSP Place 1 spray into the nose 2 (two) times daily.    . [DISCONTINUED] loratadine (CLARITIN) 10 MG tablet Take 10 mg by mouth daily.      . [DISCONTINUED] montelukast (SINGULAIR) 10 MG tablet Take 10 mg by mouth at bedtime.      . [DISCONTINUED] RABEprazole (ACIPHEX) 20 MG tablet Take 20 mg by mouth daily.      . [DISCONTINUED] traZODone (DESYREL) 100 MG tablet Take 100 mg by mouth at bedtime.       Current Facility-Administered Medications on File Prior to Visit  Medication Dose Route Frequency Provider Last Rate Last Admin  . 0.9 %  sodium chloride infusion  500 mL Intravenous Once Armbruster, Carlota Raspberry, MD         Objective:    Objective  Physical Exam Vitals and nursing note reviewed.  Constitutional:      Appearance: She is well-developed.  HENT:     Head: Normocephalic and atraumatic.  Eyes:     Conjunctiva/sclera: Conjunctivae normal.  Neck:     Thyroid: No thyromegaly.     Vascular: No carotid bruit or JVD.  Cardiovascular:     Rate and Rhythm: Normal rate and regular rhythm.     Heart sounds: Normal heart sounds. No murmur.  Pulmonary:     Effort: Pulmonary effort is normal. No respiratory distress.     Breath sounds: Normal breath sounds. No wheezing or rales.  Chest:     Chest wall: No tenderness.  Musculoskeletal:     Cervical back: Normal range of motion and neck supple.  Neurological:  Mental Status: She is alert and oriented to person, place, and time.     Sensory: Sensory deficit present.     Motor: No weakness.     Comments: Numbness in 2-4 fingers both hands with flexion of wrist  R >L No weakness in grasp  Normal strength in arms     BP 110/70 (BP Location: Left Arm, Patient Position: Sitting, Cuff Size: Normal)   Pulse 66   Temp (!) 97 F (36.1 C) (Temporal)   Resp 18   Ht 5\' 2"  (1.575 m)   Wt 131 lb 6.4 oz (59.6 kg)   SpO2 100%   BMI 24.03 kg/m  Wt Readings from Last 3 Encounters:  04/07/19 131 lb 6.4 oz (59.6 kg)  10/17/18 124 lb (56.2 kg)  09/16/18 124 lb (56.2 kg)     Lab Results  Component Value Date   WBC 4.2 08/27/2018   HGB 12.6 08/27/2018   HCT 38.6 08/27/2018   PLT 226 08/27/2018   GLUCOSE 96 12/17/2018   CHOL 158 08/27/2018   TRIG 94 08/27/2018   HDL 60 08/27/2018   LDLCALC 79 08/27/2018   ALT 39 (H) 12/17/2018   AST 34 12/17/2018   NA 139 12/17/2018   K 5.1 12/17/2018   CL 103 12/17/2018   CREATININE 0.70 12/17/2018   BUN 13 12/17/2018   CO2 31 12/17/2018   TSH 1.900 08/27/2018    US Abdomen Limited RUQ  Result Date: 07/30/2018 CLINICAL DATA:  Right upper quadrant pain.  Peptic ulcer disease. EXAM: ULTRASOUND ABDOMEN LIMITED RIGHT  UPPER QUADRANT COMPARISON:  None. FINDINGS: Gallbladder: No gallstones or wall thickening visualized. No sonographic Murphy sign noted by sonographer. Common bile duct: Diameter: 3.5 mm Liver: No focal lesion identified. Within normal limits in parenchymal echogenicity. Portal vein is patent on color Doppler imaging with normal direction of blood flow towards the liver. IMPRESSION: Normal study. Electronically Signed   By: Dorise Bullion III M.D   On: 07/30/2018 08:51     Assessment & Plan:  Plan  I have discontinued Duanna Venturino's nitrofurantoin (macrocrystal-monohydrate), hydrochlorothiazide, azithromycin, predniSONE, and promethazine-dextromethorphan. I am also having her start on furosemide. Additionally, I am having her maintain her dextroamphetamine, loperamide, ALPRAZolam, cyclobenzaprine, Vitamin D3, trimethoprim, Vilazodone HCl, fluticasone, Viibryd, omeprazole, Align, B-D 3CC LUER-LOK SYR 25GX5/8", Acetaminophen (TYLENOL 8 HOUR ARTHRITIS PAIN PO), Acetaminophen (TYLENOL PO), Homeopathic Products (ALLERGY MEDICINE PO), rOPINIRole, diazepam, sucralfate, Diclofenac Sodium, Creon, and cyanocobalamin. We will continue to administer sodium chloride.  Meds ordered this encounter  Medications  . furosemide (LASIX) 40 MG tablet    Sig: Take 1 tablet (40 mg total) by mouth daily.    Dispense:  30 tablet    Refill:  3    Problem List Items Addressed This Visit      Unprioritized   Bilateral lower extremity edema - Primary    Lasix daily Elevate low ext Compression stockings Check labs Check [place] low ext  Consider vascular referral      Relevant Orders   TSH   Vitamin D (25 hydroxy)   VAS [place] LOWER EXTREMITY VENOUS (DVT)   Numbness in both hands   Relevant Orders   Comprehensive metabolic panel   123456 and Folate Panel   Pain in both lower extremities    Check [place] R/o dvt  If swelling con't consider vascular       Relevant Medications   furosemide (LASIX) 40 MG tablet   Other  Relevant Orders   TSH   Vitamin  D (25 hydroxy)   VAS [place] LOWER EXTREMITY VENOUS (DVT)   Pain in right arm    Suspect related to severe deg changes in c spine Check [place] due to pain / swelling       Relevant Orders   VAS [place] UPPER EXTREMITY VENOUS DUPLEX   TSH   Vitamin D (25 hydroxy)      Follow-up: Return in about 6 months (around 10/08/2019), or if symptoms worsen or fail to improve, for annual exam, fasting.  [name], DO

## 2019-04-07 NOTE — Assessment & Plan Note (Signed)
Lasix daily Elevate low ext Compression stockings Check labs Check [place] low ext  Consider vascular referral

## 2019-04-08 ENCOUNTER — Telehealth: Payer: Self-pay | Admitting: Gastroenterology

## 2019-04-08 NOTE — Telephone Encounter (Signed)
[facility] is calling- stating that [name] has prescribed omeprazole to patient but that patient is also getting omeprazole from another provider as well. [facility] wanted to check with Amy to make sure she was aware about the other provider prescribing the same medication.

## 2019-04-14 ENCOUNTER — Other Ambulatory Visit: Payer: Self-pay

## 2019-04-14 ENCOUNTER — Ambulatory Visit (HOSPITAL_BASED_OUTPATIENT_CLINIC_OR_DEPARTMENT_OTHER)
Admission: RE | Admit: 2019-04-14 | Discharge: 2019-04-14 | Disposition: A | Discharge status: Home / Self Care | Payer: BC Managed Care – PPO | Source: Ambulatory Visit | Attending: Family Medicine | Admitting: Family Medicine

## 2019-04-14 DIAGNOSIS — R600 Localized edema: Secondary | ICD-10-CM

## 2019-04-14 DIAGNOSIS — M79605 Pain in left leg: Secondary | ICD-10-CM | POA: Insufficient documentation

## 2019-04-14 DIAGNOSIS — M79604 Pain in right leg: Secondary | ICD-10-CM | POA: Insufficient documentation

## 2019-04-14 DIAGNOSIS — M79601 Pain in right arm: Secondary | ICD-10-CM

## 2019-04-14 NOTE — Progress Notes (Signed)
VAS [place] UPPER EXTREMITY VENOUS DUPLEX RIGHT   04/14/19 Cardell Peach RDCS, RVT

## 2019-05-12 ENCOUNTER — Other Ambulatory Visit: Payer: Self-pay | Admitting: Gynecology

## 2019-05-12 DIAGNOSIS — Z1231 Encounter for screening mammogram for malignant neoplasm of breast: Secondary | ICD-10-CM

## 2019-05-16 ENCOUNTER — Ambulatory Visit
Admission: RE | Admit: 2019-05-16 | Discharge: 2019-05-16 | Disposition: A | Discharge status: Home / Self Care | Payer: BC Managed Care – PPO | Source: Ambulatory Visit | Attending: Gynecology | Admitting: Gynecology

## 2019-05-16 ENCOUNTER — Other Ambulatory Visit: Payer: Self-pay

## 2019-05-16 DIAGNOSIS — Z1231 Encounter for screening mammogram for malignant neoplasm of breast: Secondary | ICD-10-CM | POA: Diagnosis not present

## 2019-05-19 ENCOUNTER — Encounter: Payer: Self-pay | Admitting: Family Medicine

## 2019-05-19 ENCOUNTER — Other Ambulatory Visit: Payer: Self-pay

## 2019-05-19 ENCOUNTER — Ambulatory Visit (INDEPENDENT_AMBULATORY_CARE_PROVIDER_SITE_OTHER): Payer: BC Managed Care – PPO | Admitting: Family Medicine

## 2019-05-19 VITALS — BP 130/78 | HR 81 | Ht 62.0 in | Wt 130.0 lb

## 2019-05-19 DIAGNOSIS — M7521 Bicipital tendinitis, right shoulder: Secondary | ICD-10-CM

## 2019-05-19 NOTE — Patient Instructions (Addendum)
Thank you for coming in today. I think the problem in the elbow is distal biceps tendonitis.  It is also possible that the the bruising in the elbow from the IV irritated the median nerve as it crosses the front part of the elbow. We will try home exercises.  If not enough let me know and I will refer to hand or occupational therapy.  Possibly [place].   Let your elbow go from bent to straight slowly with a 1-2 pound hand weight.  Use a hammer and with the elbow straight turn it slowly from palm up to palm down.

## 2019-05-19 NOTE — Progress Notes (Signed)
   I, Wendy Poet, LAT, ATC, am serving as scribe for Dr. [name].  [name] is a 54 y.o. female who presents to [place] at [facility] today for R elbow and forearm pain.  She was last seen by Dr. Tamala Julian for neck pain on 04/11/18.  Since her last visit, pt reports R elbow and forearm pain since last year when a vein in her R arm was "blown" while she was hospitalized for some gastric issues.  She locates her pain to her R anterior elbow, distal upper arm and R forearm .  She rates her pain as moderate and describes her pain as sharp.  Radiating pain: Yes from R distal upper arm to R forearm Elbow swelling: No UE numbness/tingling: in her R fingers Aggravating factors: Lifting; forearm twisting and rotation Treatments tried: Tylenol; Tylenol Arthritis   Pertinent review of systems: No fevers or chills  Relevant historical information: History of gastric bypass with pancreatic insufficiency.   Exam:  BP 130/78 (BP Location: Left Arm, Patient Position: Sitting, Cuff Size: Normal)   Pulse 81   Ht 5\' 2"  (1.575 m)   Wt 130 lb (59 kg)   SpO2 99%   BMI 23.78 kg/m  General: Well Developed, well nourished, and in no acute distress.   MSK: Right elbow normal-appearing with no significant bruising or abnormality. Range of motion full. Tender palpation at anterior elbow at distal biceps insertion. Pain reproduced with resisted supination and elbow flexion. No pain with resisted pronation and elbow extension. No pain with resisted wrist motion. Pulses capillary fill and sensation are intact distally.    Lab and Radiology Results  Reports of vascular ultrasound reviewed. Patient has negative duplex ultrasound upper and lower extremities for DVT.  Ultrasound completed on April 14, 2019    Assessment and Plan: 54 y.o. female with right anterior elbow pain.  Fundamentally patient has symptoms consistent with distal biceps tendinitis.  She also has some  neurological symptoms more consistent with median nerve irritation.  It is possible that she did have an IV infiltrate or bruising at the antecubital fossa while hospitalized last year the did irritate both of these structures.  Regardless we will proceed with simple simple conservative management.  Plan for eccentric exercises focused on biceps tendon.  If not improving on her own she will notify me and I will add formal hand physical therapy or occupational therapy.  Recheck in about 6 weeks.  Return sooner if needed.  Next steps are potential for further advanced imaging and/or nerve conduction study.    Discussed warning signs or symptoms. Please see discharge instructions. Patient expresses understanding.   The above documentation has been reviewed and is accurate and complete [name]

## 2019-05-28 ENCOUNTER — Other Ambulatory Visit: Payer: Self-pay | Admitting: Family Medicine

## 2019-05-28 DIAGNOSIS — K219 Gastro-esophageal reflux disease without esophagitis: Secondary | ICD-10-CM

## 2019-06-09 DIAGNOSIS — F331 Major depressive disorder, recurrent, moderate: Secondary | ICD-10-CM | POA: Diagnosis not present

## 2019-06-13 ENCOUNTER — Encounter: Payer: Self-pay | Admitting: Family Medicine

## 2019-06-13 ENCOUNTER — Ambulatory Visit (INDEPENDENT_AMBULATORY_CARE_PROVIDER_SITE_OTHER): Payer: BC Managed Care – PPO | Admitting: Family Medicine

## 2019-06-13 ENCOUNTER — Other Ambulatory Visit: Payer: Self-pay

## 2019-06-13 VITALS — BP 118/84 | HR 83 | Ht 62.0 in | Wt 129.0 lb

## 2019-06-13 DIAGNOSIS — S39012A Strain of muscle, fascia and tendon of lower back, initial encounter: Secondary | ICD-10-CM | POA: Diagnosis not present

## 2019-06-13 DIAGNOSIS — S29012A Strain of muscle and tendon of back wall of thorax, initial encounter: Secondary | ICD-10-CM

## 2019-06-13 DIAGNOSIS — M62838 Other muscle spasm: Secondary | ICD-10-CM | POA: Diagnosis not present

## 2019-06-13 DIAGNOSIS — S29011A Strain of muscle and tendon of front wall of thorax, initial encounter: Secondary | ICD-10-CM | POA: Diagnosis not present

## 2019-06-13 MED ORDER — NITROGLYCERIN 0.2 MG/HR TD PT24
MEDICATED_PATCH | TRANSDERMAL | 1 refills | Status: DC
Start: 2019-06-13 — End: 2020-01-27

## 2019-06-13 MED ORDER — HYDROCODONE-ACETAMINOPHEN 5-325 MG PO TABS: 1.0000 | ORAL_TABLET | Freq: Four times a day (QID) | ORAL | 0 refills | Status: DC | PRN

## 2019-06-13 MED ORDER — CYCLOBENZAPRINE HCL 10 MG PO TABS: 5.0000 mg | ORAL_TABLET | Freq: Three times a day (TID) | ORAL | 1 refills | Status: DC | PRN

## 2019-06-13 NOTE — Progress Notes (Signed)
Rito Ehrlich, am serving as a Education administrator for Dr. [name].  [name] is a 54 y.o. female who presents to [place] at [facility] today for back and shoulder pain.  She was last seen by [name] on 05/19/19 for R elbow and forearm pain and was shown a HEP by [name] focusing on biceps eccentrics and forearm pronation/supination.  Since her last visit, pt reports Lower back pain that is worse in the morning does not have wings on her last vertebra causing that pain usually just on the left hand side, but now its all the way across. L shoulder hurts and has loss of ROM. States neighbors dog came up on her porch and had to hold her dog back by her collar but when she reached for her dog she had to twist out of her chair quick has used ice and heat for two days and thought the pain went away but it is now still and the pain starts in her chest to her L shoulder. This all happened a week ago.    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   MSK: C-spine normal-appearing nontender normal cervical motion. Left chest normal-appearing no bruising or swelling.  Mildly tender palpation at distal pectoralis tendon musculature. Left shoulder normal-appearing no swelling.  Tender palpation anterior shoulder near pectoralis insertion. Normal shoulder motion.   Pain with resisted internal rotation and pectoralis activation.  Left thoracic spine normal-appearing tender palpation mildly at left trapezius and rhomboid. Normal scapular motion.  L-spine normal-appearing nontender to midline.  Mildly tender palpation paraspinal  musculature. Normal lumbar motion.     Assessment and Plan: 54 y.o. female with left chest and anterior shoulder pain consistent with pectoralis strain.  Absence of palpable defect or hematoma or bruising indicates doubtful significant tear.  Discussed treatment options and plan.  She is in moderate pain but has to work tomorrow.  Plan for home exercise program as taught by ATC in clinic.  Additionally will treat with Flexeril.  Limited hydrocodone.  We will also start nitroglycerin patch protocol.  If not improving she will notify me and I will order formal physical therapy.  Recheck back as needed.  Additionally patient has evidence of rhomboid strain which is consistent with the forces imparted on her arm by the dog.  Again treatment as above with home exercises and medications.  Addition patient has a low back strain.  Again Flexeril limited hydrocodone who exercise program and plan for PT if not better..   97110; 15 additional minutes spent for Therapeutic exercises as stated in above notes.  This included exercises focusing on stretching, strengthening, with significant focus on eccentric aspects.   Long term goals include an improvement in range of motion, strength, endurance as well as avoiding reinjury. Patient's frequency would include in 1-2 times a day, 3-5 times a week for a duration of 6-12 weeks.  Proper technique shown and discussed handout in great detail with ATC.  All questions were discussed and answered.    PDMP reviewed during this encounter. No orders of  the defined types were placed in this encounter.  Meds ordered this encounter  Medications  . cyclobenzaprine (FLEXERIL) 10 MG tablet    Sig: Take 0.5-1 tablets (5-10 mg total) by mouth 3 (three) times daily as needed.    Dispense:  30 tablet    Refill:  1  . HYDROcodone-acetaminophen (NORCO/VICODIN) 5-325 MG tablet    Sig: Take 1 tablet by mouth every 6 (six) hours as needed.    Dispense:  15 tablet    Refill:  0    . nitroGLYCERIN (NITRODUR - DOSED IN MG/24 HR) 0.2 mg/hr patch    Sig: Apply 1/4 patch daily to tendon for tendonitis.    Dispense:  30 patch    Refill:  1     Discussed warning signs or symptoms. Please see discharge instructions. Patient expresses understanding.   The above documentation has been reviewed and is accurate and complete [name], M.D.

## 2019-06-13 NOTE — Patient Instructions (Addendum)
I think you have a pectoralis muscle strain and a rhomboid strain.  You also have a low back strain.  PT will be very helpful.  Continue tylenol.  Use flexeril for spasm and pain.   Please perform the exercise program that we have prepared for you and gone over in detail on a daily basis.  In addition to the handout you were provided you can access your program through: [email or web address]   Your unique program code is:  [email or web address]   Try the nitro patches on the left pec muscle.  Use 1/4 patch at a time.   Nitroglycerin Protocol   Apply 1/4 nitroglycerin patch to affected area daily.  Change position of patch within the affected area every 24 hours.  You may experience a headache during the first 1-2 weeks of using the patch, these should subside.  If you experience headaches after beginning nitroglycerin patch treatment, you may take your preferred over the counter pain reliever.  Another side effect of the nitroglycerin patch is skin irritation or rash related to patch adhesive.  Please notify our office if you develop more severe headaches or rash, and stop the patch.  Tendon healing with nitroglycerin patch may require 12 to 24 weeks depending on the extent of injury.  Men should not use if taking Viagra, Cialis, or Levitra.   Do not use if you have migraines or rosacea.    Pectoralis Major Tear Rehab Ask your health care provider which exercises are safe for you. Do exercises exactly as told by your health care provider and adjust them as directed. It is normal to feel mild stretching, pulling, tightness, or discomfort as you do these exercises. Stop right away if you feel sudden pain or your pain gets worse. Do not begin these exercises until told by your health care provider. Stretching and range-of-motion exercises These exercises warm up your muscles and joints and improve the movement and flexibility of your shoulder. These exercises can also help to relieve pain,  numbness, and tingling. Pendulum This is a shoulder exercise in which you let the injured arm dangle toward the floor and then swing it like a clock pendulum. 1. Stand near a wall or a surface that you can hold onto for balance. 2. Bend at the waist and let your left / right arm hang straight down. Use your other arm to keep your balance. 3. Relax your arm and shoulder muscles, and move your hips and your trunk so your left / right arm swings freely. Your arm should swing because of the motion of your body, not because you are using your arm or shoulder muscles. 4. Keep moving your hips and trunk so your arm swings in the following directions, as told by your health care provider: ? Side to side. ? Forward and backward. ? In clockwise and counterclockwise circles. 5. Slowly return to the starting position. Repeat __________ times. Complete this exercise __________ times a day. Standing shoulder abduction, passive In this exercise, the injured shoulder relaxes (passive) while you use the healthy arm to push it away from your body (abduction). 1. Stand and hold a broomstick, a cane, or a similar object. Place your hands a little more than shoulder width apart on the object. Your left / right hand should be palm-up, and your other hand should be palm-down. 2. While keeping your elbow straight and your shoulder muscles relaxed, push the stick across your body toward your left / right side. Raise your left / right arm  to the side of your body and then over your head until you feel a stretch in your shoulder. ? Stop when you reach the angle that is recommended by your health care provider. ? Avoid shrugging your shoulder while you raise your arm. Keep your shoulder blade tucked down toward the middle of your spine. 3. Hold for __________ seconds. 4. Slowly return to the starting position. Repeat __________ times. Complete this exercise __________ times a day. Supine wand shoulder flexion, passive In  this exercise, the injured shoulder relaxes (passive) while you use the healthy arm to move it (flexion). 1. Lie on your back (supine position). You may bend your knees for comfort. 2. Hold a broomstick, a cane, or a similar object so that your hands are about shoulder width apart on the object. Your palms should face toward your feet. 3. Raise your left / right arm in front of your face, then behind your head (toward the floor). Use your other hand to help you do this. Stop when you feel a gentle stretch in your shoulder, or when you reach the angle that is recommended by your health care provider. 4. Hold for __________ seconds. 5. Use the stick and your other arm to help you return your left / right arm to the starting position. Repeat __________ times. Complete this exercise __________ times a day. Wand shoulder external rotation, passive In this exercise, the injured shoulder relaxes (passive) while you use the healthy arm to push it away to your side (external rotation). 1. Stand and hold a broomstick, a cane, or a similar object so your hands are about shoulder width apart on the object. 2. Start with your arms hanging down, then bend both elbows to a 90-degree angle (right angle). 3. Keep your left / right elbow at your side. Use your other hand to push the stick so your left / right forearm moves away from your body, out to your side. ? Keep your left / right elbow bent to 90 degrees and keep it against your side. ? Stop when you feel a gentle stretch in your shoulder, or when you reach the angle recommended by your health care provider. 4. Hold for __________ seconds. 5. Use the stick to help you return your left / right arm to the starting position. Repeat __________ times. Complete this exercise __________ times a day. Strengthening exercises These exercises build strength and endurance in your shoulder. Endurance is the ability to use your muscles for a long time, even after your  muscles get tired. Scapular protraction, standing  1. Stand so you are facing a wall. Place your feet about one arm-length away from the wall. 2. Place your hands on the wall and straighten your elbows. 3. Keep your hands on the wall as you push your upper back away from the wall. You should feel your shoulder blades (scapulae) sliding forward (protraction) around your rib cage. Keep your elbows and your head still. ? If you are not sure that you are doing this exercise correctly, ask your health care provider for more instructions. 4. Hold for __________ seconds. 5. Slowly return to the starting position. Let your muscles relax completely before you repeat this exercise. Repeat __________ times. Complete this exercise __________ times a day. Scapular retraction, seated This exercise is also called shoulder blade squeezes. 1. Sit with good posture in a stable chair without armrests. Do not let your back touch the back of the chair. 2. Your arms should be at your  sides with your elbows bent. You may rest your forearms on a pillow if that is more comfortable. 3. Squeeze your shoulder blades (scapulae) together. Bring them down and back (retraction). ? Keep your shoulders level. ? Do not lift your shoulders up toward your ears. 4. Hold for __________ seconds. 5. Return to the starting position. Repeat __________ times. Complete this exercise __________ times a day. This information is not intended to replace advice given to you by your health care provider. Make sure you discuss any questions you have with your health care provider. Document Revised: 05/02/2018 Document Reviewed: 01/07/2018 Elsevier Patient Education  [place].

## 2019-07-02 ENCOUNTER — Other Ambulatory Visit: Payer: Self-pay | Admitting: Family Medicine

## 2019-07-02 DIAGNOSIS — M79605 Pain in left leg: Secondary | ICD-10-CM

## 2019-07-02 DIAGNOSIS — M79604 Pain in right leg: Secondary | ICD-10-CM

## 2019-08-22 ENCOUNTER — Other Ambulatory Visit: Payer: Self-pay | Admitting: Family Medicine

## 2019-08-22 DIAGNOSIS — E538 Deficiency of other specified B group vitamins: Secondary | ICD-10-CM

## 2019-08-24 ENCOUNTER — Other Ambulatory Visit: Payer: Self-pay | Admitting: Family Medicine

## 2019-08-24 DIAGNOSIS — R600 Localized edema: Secondary | ICD-10-CM

## 2019-08-24 DIAGNOSIS — G2581 Restless legs syndrome: Secondary | ICD-10-CM

## 2019-08-25 ENCOUNTER — Other Ambulatory Visit: Payer: Self-pay

## 2019-08-25 DIAGNOSIS — G2581 Restless legs syndrome: Secondary | ICD-10-CM

## 2019-08-25 MED ORDER — ROPINIROLE HCL 1 MG PO TABS: ORAL_TABLET | ORAL | 0 refills | Status: DC

## 2019-08-29 ENCOUNTER — Ambulatory Visit (INDEPENDENT_AMBULATORY_CARE_PROVIDER_SITE_OTHER): Payer: BC Managed Care – PPO | Admitting: Family Medicine

## 2019-08-29 ENCOUNTER — Encounter: Payer: Self-pay | Admitting: Family Medicine

## 2019-08-29 ENCOUNTER — Other Ambulatory Visit: Payer: Self-pay

## 2019-08-29 VITALS — BP 122/78 | HR 73 | Temp 97.8°F | Resp 18 | Ht 62.0 in | Wt 130.4 lb

## 2019-08-29 DIAGNOSIS — Z0000 Encounter for general adult medical examination without abnormal findings: Secondary | ICD-10-CM | POA: Diagnosis not present

## 2019-08-29 DIAGNOSIS — M79604 Pain in right leg: Secondary | ICD-10-CM

## 2019-08-29 DIAGNOSIS — J0110 Acute frontal sinusitis, unspecified: Secondary | ICD-10-CM | POA: Diagnosis not present

## 2019-08-29 DIAGNOSIS — M62838 Other muscle spasm: Secondary | ICD-10-CM

## 2019-08-29 DIAGNOSIS — E538 Deficiency of other specified B group vitamins: Secondary | ICD-10-CM

## 2019-08-29 DIAGNOSIS — E559 Vitamin D deficiency, unspecified: Secondary | ICD-10-CM | POA: Diagnosis not present

## 2019-08-29 DIAGNOSIS — M79605 Pain in left leg: Secondary | ICD-10-CM

## 2019-08-29 LAB — CBC WITH DIFFERENTIAL/PLATELET
Basophils Absolute: 0.1 10*3/uL (ref 0.0–0.1)
Basophils Relative: 1.3 % (ref 0.0–3.0)
Eosinophils Absolute: 0.4 10*3/uL (ref 0.0–0.7)
Eosinophils Relative: 9 % — ABNORMAL HIGH (ref 0.0–5.0)
HCT: 40 % (ref 36.0–46.0)
Hemoglobin: 13.1 g/dL (ref 12.0–15.0)
Lymphocytes Relative: 31.9 % (ref 12.0–46.0)
Lymphs Abs: 1.5 10*3/uL (ref 0.7–4.0)
MCHC: 32.9 g/dL (ref 30.0–36.0)
MCV: 96 fl (ref 78.0–100.0)
Monocytes Absolute: 0.4 10*3/uL (ref 0.1–1.0)
Monocytes Relative: 8.5 % (ref 3.0–12.0)
Neutro Abs: 2.3 10*3/uL (ref 1.4–7.7)
Neutrophils Relative %: 49.3 % (ref 43.0–77.0)
Platelets: 206 10*3/uL (ref 150.0–400.0)
RBC: 4.16 Mil/uL (ref 3.87–5.11)
RDW: 14.2 % (ref 11.5–15.5)
WBC: 4.7 10*3/uL (ref 4.0–10.5)

## 2019-08-29 LAB — COMPREHENSIVE METABOLIC PANEL
ALT: 50 U/L — ABNORMAL HIGH (ref 0–35)
AST: 74 U/L — ABNORMAL HIGH (ref 0–37)
Albumin: 4.4 g/dL (ref 3.5–5.2)
Alkaline Phosphatase: 63 U/L (ref 39–117)
BUN: 13 mg/dL (ref 6–23)
CO2: 31 mEq/L (ref 19–32)
Calcium: 9.5 mg/dL (ref 8.4–10.5)
Chloride: 103 mEq/L (ref 96–112)
Creatinine, Ser: 0.77 mg/dL (ref 0.40–1.20)
GFR: 78.01 mL/min (ref >60.00)
Glucose, Bld: 75 mg/dL (ref 70–99)
Potassium: 4.5 mEq/L (ref 3.5–5.1)
Sodium: 139 mEq/L (ref 135–145)
Total Bilirubin: 0.7 mg/dL (ref 0.2–1.2)
Total Protein: 7 g/dL (ref 6.0–8.3)

## 2019-08-29 LAB — LIPID PANEL
Cholesterol: 159 mg/dL (ref 0–200)
HDL: 93.2 mg/dL (ref >39.00)
LDL Cholesterol: 51 mg/dL (ref 0–99)
NonHDL: 65.55
Total CHOL/HDL Ratio: 2
Triglycerides: 71 mg/dL (ref 0.0–149.0)
VLDL: 14.2 mg/dL (ref 0.0–40.0)

## 2019-08-29 LAB — VITAMIN D 25 HYDROXY (VIT D DEFICIENCY, FRACTURES): VITD: 43.17 ng/mL (ref 30.00–100.00)

## 2019-08-29 LAB — VITAMIN B12: Vitamin B-12: 481 pg/mL (ref 211–911)

## 2019-08-29 MED ORDER — CYANOCOBALAMIN 1000 MCG/ML IJ SOLN: INTRAMUSCULAR | 1 refills | Status: DC

## 2019-08-29 MED ORDER — FLUTICASONE PROPIONATE 50 MCG/ACT NA SUSP: 2.0000 | Freq: Every day | NASAL | 5 refills | Status: DC

## 2019-08-29 MED ORDER — "BD LUER-LOK SYRINGE 25G X 5/8"" 3 ML MISC": 3 refills | Status: DC

## 2019-08-29 MED ORDER — FUROSEMIDE 40 MG PO TABS: 40.0000 mg | ORAL_TABLET | Freq: Every day | ORAL | 1 refills | Status: DC

## 2019-08-29 MED ORDER — VITAMIN D3 50 MCG (2000 UT) PO CAPS: 2000.0000 [IU] | ORAL_CAPSULE | Freq: Every day | ORAL | 1 refills | Status: DC

## 2019-08-29 MED ORDER — CYCLOBENZAPRINE HCL 10 MG PO TABS: 5.0000 mg | ORAL_TABLET | Freq: Three times a day (TID) | ORAL | 1 refills | Status: DC | PRN

## 2019-08-29 NOTE — Progress Notes (Signed)
Subjective:     [name] is a 54 y.o. female and is here for a comprehensive physical exam. The patient reports no problems.   She needs refills and labs   Social History   Socioeconomic History   Marital status: Married    Spouse name: Not on file   Number of children: Not on file   Years of education: Not on file   Highest education level: Not on file  Occupational History    Employer: [employer]    Comment: [employer]  Tobacco Use   Smoking status: Former Smoker    Quit date: 01/24/1987    Years since quitting: 32.6   Smokeless tobacco: Never Used  Vaping Use   Vaping Use: Former   Substances: Flavoring   Devices: has vaped with flavor but no nicotine. Been 8 years  Substance and Sexual Activity   Alcohol use: Yes    Alcohol/week: 35.0 standard drinks    Types: 35 Cans of beer per week    Comment: Pt states she drinks 5 beers mosts days   Drug use: No   Sexual activity: Not Currently  Other Topics Concern   Not on file  Social History Narrative   Regular exercise--no (because of knees)   Social Determinants of Health   Financial Resource Strain:    Difficulty of Paying Living Expenses:   Food Insecurity:    Worried About Charity fundraiser in the Last Year:    Arboriculturist in the Last Year:   Transportation Needs:    Film/video editor (Medical):    Lack of Transportation (Non-Medical):   Physical Activity:    Days of Exercise per Week:    Minutes of Exercise per Session:   Stress:    Feeling of Stress :   Social Connections:    Frequency of Communication with Friends and Family:    Frequency of Social Gatherings with Friends and Family:    Attends Religious Services:    Active Member of Clubs or Organizations:    Attends Music therapist:    Marital Status:   Intimate Partner Violence:    Fear of Current or Ex-Partner:    Emotionally Abused:    Physically Abused:    Sexually Abused:    Health  Maintenance  Topic Date Due   COVID-19 Vaccine (1) Never done   INFLUENZA VACCINE  08/24/2019   MAMMOGRAM  05/15/2020   COLONOSCOPY  04/16/2022   TETANUS/TDAP  08/26/2028   Hepatitis C Screening  Completed   HIV Screening  Completed    The following portions of the patient's history were reviewed and updated as appropriate:  She  has a past medical history of Allergy, Anemia, Anxiety, Asthma, Depression, GERD (gastroesophageal reflux disease), Heart murmur, Hiatal hernia, Peptic ulcer, and UTI (urinary tract infection). She does not have any pertinent problems on file. She  has a past surgical history that includes Abdominal hysterectomy; Gastric bypass (2007); Tubal ligation; laparotomy (N/A, 03/08/2018); Esophagogastroduodenoscopy (egd) with propofol (N/A, 07/11/2018); biopsy (07/11/2018); and Hemostasis clip placement (07/11/2018). Her family history includes Cancer in her father, maternal grandfather, and paternal grandfather; Depression in her brother and sister; Diabetes in her maternal grandfather, maternal grandmother, paternal grandfather, and paternal grandmother; Hypertension in her father; Liver disease in her maternal grandfather; Stroke in her mother. She  reports that she quit smoking about 32 years ago. She has never used smokeless tobacco. She reports current alcohol use of about 35.0 standard drinks of  alcohol per week. She reports that she does not use drugs. She has a current medication list which includes the following prescription(s): acetaminophen, acetaminophen, alprazolam, vitamin d3, cyanocobalamin, cyclobenzaprine, dextroamphetamine, diazepam, diclofenac sodium, fluticasone, furosemide, homeopathic products, hydrocodone-acetaminophen, loperamide, nitroglycerin, omeprazole, creon, align, ropinirole, sucralfate, b-d 3cc luer-lok syr 25gx5/8", trimethoprim, viibryd, vilazodone hcl, [DISCONTINUED] azelastine-fluticasone, [DISCONTINUED] loratadine, [DISCONTINUED]  montelukast, [DISCONTINUED] rabeprazole, and [DISCONTINUED] trazodone, and the following Facility-Administered Medications: sodium chloride. Current Outpatient Medications on File Prior to Visit  Medication Sig Dispense Refill   Acetaminophen (TYLENOL 8 HOUR ARTHRITIS PAIN PO) Take 2 tablets by mouth as needed.     Acetaminophen (TYLENOL PO) Take 1 tablet by mouth as needed (Rapid release tablet).     ALPRAZolam (XANAX) 1 MG tablet Take 0.5 mg by mouth as needed (before procedures or anxiety).      dextroamphetamine (DEXTROSTAT) 10 MG tablet Take 20 mg by mouth 3 (three) times daily.      diazepam (VALIUM) 5 MG tablet Take 1 tablet by mouth as needed.     Diclofenac Sodium 2 % SOLN Apply 1 pump twice daily as needed. 112 g 1   Homeopathic Products (ALLERGY MEDICINE PO) Take 1 tablet by mouth as needed (Takes the Clortabs. Equate Brand).     HYDROcodone-acetaminophen (NORCO/VICODIN) 5-325 MG tablet Take 1 tablet by mouth every 6 (six) hours as needed. 15 tablet 0   loperamide (ANTI-DIARRHEAL) 2 MG capsule Take 2 mg by mouth daily as needed.      nitroGLYCERIN (NITRODUR - DOSED IN MG/24 HR) 0.2 mg/hr patch Apply 1/4 patch daily to tendon for tendonitis. 30 patch 1   omeprazole (PRILOSEC) 40 MG capsule Take 1 capsule (40 mg total) by mouth 2 (two) times a day. Open capsule and sprinkle on applesauce. 60 capsule 11   Pancrelipase, Lip-Prot-Amyl, (CREON) 24000-76000 units CPEP Take 2 capsules (48,000 Units total) by mouth 3 (three) times daily before meals. Take 2 capsules WITH meals and 1 capsule WITH a snack 240 capsule 3   Probiotic Product (ALIGN) CHEW Chew 2 tablets by mouth daily. Align pre and probiotic with vitamain C-take one tablet each Align pre and probiotic with Vitamin b12 for immune and energy-take one tablet each     rOPINIRole (REQUIP) 1 MG tablet TAKE 1 TABLET (1 MG TOTAL) BY MOUTH AT BEDTIME. 90 tablet 0   sucralfate (CARAFATE) 1 g tablet Take 1 tablet (1 g total) by  mouth every 8 (eight) hours as needed. Slowly dissolve tablet in 1 tablespoon of distilled water before taking 60 tablet 3   trimethoprim (TRIMPEX) 100 MG tablet Take 1 tablet (100 mg total) by mouth daily. 90 tablet 1   Vilazodone HCl (VIIBRYD) 20 MG TABS Take 1 tablet by mouth daily. At lunch     Vilazodone HCl (VIIBRYD) 40 MG TABS Take 40 mg by mouth daily. In the morning     [DISCONTINUED] Azelastine-Fluticasone (DYMISTA) 137-50 MCG/ACT SUSP Place 1 spray into the nose 2 (two) times daily.     [DISCONTINUED] loratadine (CLARITIN) 10 MG tablet Take 10 mg by mouth daily.       [DISCONTINUED] montelukast (SINGULAIR) 10 MG tablet Take 10 mg by mouth at bedtime.       [DISCONTINUED] RABEprazole (ACIPHEX) 20 MG tablet Take 20 mg by mouth daily.       [DISCONTINUED] traZODone (DESYREL) 100 MG tablet Take 100 mg by mouth at bedtime.       Current Facility-Administered Medications on File Prior to Visit  Medication Dose Route  Frequency Provider Last Rate Last Admin   0.9 %  sodium chloride infusion  500 mL Intravenous Once Armbruster, Carlota Raspberry, MD       She is allergic to penicillins and codeine..  Review of Systems Review of Systems  Constitutional: Negative for activity change, appetite change and fatigue.  HENT: Negative for hearing loss, congestion, tinnitus and ear discharge.  dentist q38m Eyes: Negative for visual disturbance (see optho q1y -- vision corrected to 20/20 with glasses).  Respiratory: Negative for cough, chest tightness and shortness of breath.   Cardiovascular: Negative for chest pain, palpitations and leg swelling.  Gastrointestinal: Negative for abdominal pain, diarrhea, constipation and abdominal distention.  Genitourinary: Negative for urgency, frequency, decreased urine volume and difficulty urinating.  Musculoskeletal: Negative for back pain, arthralgias and gait problem.  Skin: Negative for color change, pallor and rash.  Neurological: Negative for dizziness,  light-headedness, numbness and headaches.  Hematological: Negative for adenopathy. Does not bruise/bleed easily.  Psychiatric/Behavioral: Negative for suicidal ideas, confusion, sleep disturbance, self-injury, dysphoric mood, decreased concentration and agitation.       Objective:    BP 122/78 (BP Location: Right Arm, Patient Position: Sitting, Cuff Size: Normal)    Pulse 73    Temp 97.8 F (36.6 C) (Oral)    Resp 18    Ht 5\' 2"  (1.575 m)    Wt 130 lb 6.4 oz (59.1 kg)    SpO2 100%    BMI 23.85 kg/m  General appearance: alert, cooperative, appears stated age and no distress Head: Normocephalic, without obvious abnormality, atraumatic Eyes: negative findings: lids and lashes normal, conjunctivae and sclerae normal and pupils equal, round, reactive to light and accomodation Ears: normal TM's and external ear canals both ears Neck: no adenopathy, no carotid bruit, no JVD, supple, symmetrical, trachea midline and thyroid not enlarged, symmetric, no tenderness/mass/nodules Back: symmetric, no curvature. ROM normal. No CVA tenderness. Lungs: clear to auscultation bilaterally Breasts: normal appearance, no masses or tenderness Heart: regular rate and rhythm, S1, S2 normal, no murmur, click, rub or gallop Abdomen: soft, non-tender; bowel sounds normal; no masses,  no organomegaly Pelvic: deferred --gyn Extremities: extremities normal, atraumatic, no cyanosis or edema Pulses: 2+ and symmetric Skin: Skin color, texture, turgor normal. No rashes or lesions Lymph nodes: Cervical, supraclavicular, and axillary nodes normal. Neurologic: Alert and oriented X 3, normal strength and tone. Normal symmetric reflexes. Normal coordination and gait    Assessment:    Healthy female exam.      Plan:    ghm utd Check labs  Pt had covid vaccine  See After Visit Summary for Counseling Recommendations    1. B12 deficiency Refill  - cyanocobalamin (,VITAMIN B-12,) 1000 MCG/ML injection; INJECT 1,000 MCG  AS DIRECTED EVERY 30 DAYS.  Dispense: 3 mL; Refill: 1 - SYRINGE-NEEDLE, DISP, 3 ML (B-D 3CC LUER-LOK SYR 25GX5/8") 25G X 5/8" 3 ML MISC; USE AS DIRECTED WITH B-12 INJECTION  Dispense: 16 each; Refill: 3 - Vitamin B12  2. Acute frontal sinusitis, recurrence not specified Not active  Uses flonase for seasonal allergies  - fluticasone (FLONASE) 50 MCG/ACT nasal spray; Place 2 sprays into both nostrils daily.  Dispense: 16 g; Refill: 5  3. Pain in both lower extremities stable - furosemide (LASIX) 40 MG tablet; Take 1 tablet (40 mg total) by mouth daily.  Dispense: 90 tablet; Refill: 1  4. Muscle spasm stable - cyclobenzaprine (FLEXERIL) 10 MG tablet; Take 0.5-1 tablets (5-10 mg total) by mouth 3 (three) times daily as needed.  Dispense: 30 tablet; Refill: 1  5. Vitamin D deficiency Check labs  - Cholecalciferol (VITAMIN D3) 50 MCG (2000 UT) capsule; Take 1 capsule (2,000 Units total) by mouth daily.  Dispense: 30 capsule; Refill: 1 - Vitamin D (25 hydroxy)  6. Preventative health care See above  - Vitamin B12 - Lipid panel - CBC with Differential/Platelet - Comprehensive metabolic panel - Vitamin D (25 hydroxy)

## 2019-08-29 NOTE — Patient Instructions (Signed)

## 2019-09-01 ENCOUNTER — Encounter: Payer: BC Managed Care – PPO | Admitting: Family Medicine

## 2019-09-03 ENCOUNTER — Other Ambulatory Visit: Payer: Self-pay | Admitting: Family Medicine

## 2019-09-03 DIAGNOSIS — R748 Abnormal levels of other serum enzymes: Secondary | ICD-10-CM

## 2019-09-26 ENCOUNTER — Other Ambulatory Visit (INDEPENDENT_AMBULATORY_CARE_PROVIDER_SITE_OTHER): Payer: BC Managed Care – PPO

## 2019-09-26 ENCOUNTER — Other Ambulatory Visit: Payer: Self-pay

## 2019-09-26 DIAGNOSIS — R748 Abnormal levels of other serum enzymes: Secondary | ICD-10-CM

## 2019-09-27 LAB — HEPATIC FUNCTION PANEL
AG Ratio: 1.8 (calc) (ref 1.0–2.5)
ALT: 23 U/L (ref 6–29)
AST: 32 U/L (ref 10–35)
Albumin: 4.2 g/dL (ref 3.6–5.1)
Alkaline phosphatase (APISO): 55 U/L (ref 37–153)
Bilirubin, Direct: 0.1 mg/dL (ref 0.0–0.2)
Globulin: 2.3 g/dL (calc) (ref 1.9–3.7)
Indirect Bilirubin: 0.5 mg/dL (calc) (ref 0.2–1.2)
Total Bilirubin: 0.6 mg/dL (ref 0.2–1.2)
Total Protein: 6.5 g/dL (ref 6.1–8.1)

## 2019-09-27 LAB — GAMMA GT: GGT: 39 U/L (ref 3–70)

## 2019-11-07 ENCOUNTER — Telehealth: Payer: Self-pay | Admitting: Family Medicine

## 2019-11-07 NOTE — Telephone Encounter (Signed)
Patient is requesting a referral to a counselor.She does not care for the counselor she see now.    Please Advise

## 2019-11-10 NOTE — Telephone Encounter (Signed)
Pt called. Behavioral health list mailed to patient.

## 2019-11-10 NOTE — Telephone Encounter (Signed)
Yes-- ok to send list to her

## 2019-11-10 NOTE — Telephone Encounter (Signed)
Okay to mail list we have? Please advise

## 2020-01-27 ENCOUNTER — Ambulatory Visit (INDEPENDENT_AMBULATORY_CARE_PROVIDER_SITE_OTHER): Payer: BC Managed Care – PPO | Admitting: Nurse Practitioner

## 2020-01-27 ENCOUNTER — Encounter: Payer: Self-pay | Admitting: Nurse Practitioner

## 2020-01-27 ENCOUNTER — Other Ambulatory Visit (INDEPENDENT_AMBULATORY_CARE_PROVIDER_SITE_OTHER): Payer: BC Managed Care – PPO

## 2020-01-27 VITALS — BP 140/64 | HR 74 | Ht 62.25 in | Wt 125.0 lb

## 2020-01-27 DIAGNOSIS — F439 Reaction to severe stress, unspecified: Secondary | ICD-10-CM | POA: Diagnosis not present

## 2020-01-27 DIAGNOSIS — R634 Abnormal weight loss: Secondary | ICD-10-CM

## 2020-01-27 DIAGNOSIS — R61 Generalized hyperhidrosis: Secondary | ICD-10-CM

## 2020-01-27 LAB — CBC WITH DIFFERENTIAL/PLATELET
Basophils Absolute: 0 10*3/uL (ref 0.0–0.1)
Basophils Relative: 0.6 % (ref 0.0–3.0)
Eosinophils Absolute: 0.3 10*3/uL (ref 0.0–0.7)
Eosinophils Relative: 4.7 % (ref 0.0–5.0)
HCT: 37.5 % (ref 36.0–46.0)
Hemoglobin: 12.7 g/dL (ref 12.0–15.0)
Lymphocytes Relative: 23.9 % (ref 12.0–46.0)
Lymphs Abs: 1.7 10*3/uL (ref 0.7–4.0)
MCHC: 33.9 g/dL (ref 30.0–36.0)
MCV: 94.3 fl (ref 78.0–100.0)
Monocytes Absolute: 0.4 10*3/uL (ref 0.1–1.0)
Monocytes Relative: 6.5 % (ref 3.0–12.0)
Neutro Abs: 4.4 10*3/uL (ref 1.4–7.7)
Neutrophils Relative %: 64.3 % (ref 43.0–77.0)
Platelets: 218 10*3/uL (ref 150.0–400.0)
RBC: 3.97 Mil/uL (ref 3.87–5.11)
RDW: 13.4 % (ref 11.5–15.5)
WBC: 6.9 10*3/uL (ref 4.0–10.5)

## 2020-01-27 LAB — COMPREHENSIVE METABOLIC PANEL
ALT: 21 U/L (ref 0–35)
AST: 32 U/L (ref 0–37)
Albumin: 4.7 g/dL (ref 3.5–5.2)
Alkaline Phosphatase: 64 U/L (ref 39–117)
BUN: 17 mg/dL (ref 6–23)
CO2: 29 mEq/L (ref 19–32)
Calcium: 9.4 mg/dL (ref 8.4–10.5)
Chloride: 103 mEq/L (ref 96–112)
Creatinine, Ser: 0.75 mg/dL (ref 0.40–1.20)
GFR: 90.02 mL/min (ref >60.00)
Glucose, Bld: 98 mg/dL (ref 70–99)
Potassium: 4.3 mEq/L (ref 3.5–5.1)
Sodium: 138 mEq/L (ref 135–145)
Total Bilirubin: 0.3 mg/dL (ref 0.2–1.2)
Total Protein: 7.3 g/dL (ref 6.0–8.3)

## 2020-01-27 LAB — C-REACTIVE PROTEIN: CRP: <1 mg/dL (ref 0.5–20.0)

## 2020-01-27 NOTE — Progress Notes (Signed)
01/27/2020 [name] [id number] [birth year]   Chief Complaint:  Sweats after eating   History of Present Illness: [name] is a 55 year old female with a past medical history of asthma, depression, PUD and GERD. She tested + for Covid 19 11/2019 at an urgent care in [place]. She underwent Roux-en-Y gastric bypass surgery in 2007, perforation of a marginal ulcer along her gastrojejunal anastomosis requiring an exploratory laparotomy and patch repair 02/2018. She was admitted to the hospital 06/2018 with an UGI bleed/melena with anemia with a Hg level of 8.9 ->7.6 down from 10.4. EGD revealed 2 clean-basedmarginalulcers.Gastrojejunal anastomosis was friable, had contact bleeding requiring 2 clips for hemostasis. She received IV iron, no blood transfusions were required. A repeat EGD was done 10/17/2018 which showed healed ulcerations with residual clip and a very small aspect of a buried staple which were not removed. Biopsies showed evidence of reflux and  inactive chronic gastritis, no H. Pylori. A repeat EGD in a few months was considered if her abdominal pain persisted. She has not been seen in our office since her last EGD 10/17/2018.  She presents to our office today with complains of having warm sweats which occurs after 15 to 30 minutes after eating at least once daily x 6 months. She describes feeling as if she is on fire. She was recently seen by her gynecologist and she was started on a hormone patch one month ago. Her sweats have not improved or worsened since starting the patch.  No other new medications.  No NSAID use. She uses only Tylenol for lower back pain. Occasional nausea. No vomiting. No heartburn.  Persistent diminished sense of taste since she tested + for Covid 19 Nov. 2021. She remains on Omeprazole 40 mg twice daily.  She has sharp LUQ pain once about one week ago which lasted for a few seconds without recurrence. Otherwise, no upper or lower abdominal pain. She is  passing a normal formed brown BM most days. She has a loose stool if she consumes a protein drink.  Prior steatorrhea without improvement on Creon so she stopped taking it. No rectal bleeding or melena.  She underwent a colonoscopy by Dr. Sharlett Iles in 2012 which was normal.  She reports losing 10lbs over the past 4 months.  She eats a small meal every 3 to 4 hours. She received a Covid booster and flu shot today. She drinks 3-5 Michelob ultra beers every day or every other day.  Her stress level is extremely elevated since she lost her full-time job about a year ago due to the Covid pandemic.  She is working at [employer] and she is on her feet all day long.  She is also taking care of her elderly mother who has progressive dementia.  No family history of esophageal, gastric or colorectal cancer.   EGD 10/17/2018: Esophagogastric landmarks identified. - 1 cm hiatal hernia. - Z-line irregular. Biopsied. - Roux-en-Y gastrojejunostomy with gastrojejunal anastomosis characterized by healthy appearing mucosa - healed ulcerations with residual clip that remains and very small aspect of a buried staple - both without ulceration. I did not remove either at this time, I'm not convinced this is causing her symptoms but can consider a repeat exam in another few months Biopsy report: 1. Surgical [P], gastric - CHRONIC INACTIVE GASTRITIS, MILD. - THERE IS NO EVIDENCE OF HELICOBACTER PYLORI, DYSPLASIA, OR MALIGNANCY. - SEE COMMENT. 2. Surgical [P], distal esophagus - GASTROESOPHAGEAL JUNCTION MUCOSA WITH MILD INFLAMMATION CONSISTENT WITH GASTROESOPHAGEAL  REFLUX. - THERE IS NO EVIDENCE OF GOBLET CELL METAPLASIA, DYSPLASIA, OR MALIGNANCY. Microscopic  EGD 07/11/2018: Evidence of a Roux-en-Y gastrojejunostomy was found. Pouch measured 5 cm from EGJ to anastomosis. The gastrojejunal anastomosis was characterized by friable mucosa. Two clean based ulcers on jejunal side of anastomsis, one measuring 72mm and one  measuring 6 mm. A suture was noted adjacent to the smaller ulcer. This anastomosis was traversed. Afferent and efferent limbs examined about 15 cm each and appeared normal. After withdrawal from the efferent limb there was an area on the gastric side of the anastomosis with persistent bleeding felt due to contact by the scope. 2 clips were applied with hemostasis acheived. A 3rd clip did not engage the tissue and it was removed orally with a forceps. - Two clean based marginal ulcers. - Z-line variable, at the gastroesophageal junction.  - Small hiatal hernia. - Roux-en-Y gastrojejunostomy with gastrojejunal anastomosis characterized by friable mucosa and contact bleeding requiring 2 clips for hemostasis. Biopsy report: ESOPHAGEAL SQUAMOUS AND CARDIAC MUCOSA WITH NO SPECIFIC HISTOPATHOLOGIC CHANGES - NEGATIVE FOR INTESTINAL METAPLASIA OR DYSPLASIA  Colonoscopy by Dr. Sharlett Iles 04/16/2010: -Normal colonoscopy -Recall colonoscopy 10 years   Current Outpatient Medications on File Prior to Visit  Medication Sig Dispense Refill   Acetaminophen (TYLENOL 8 HOUR ARTHRITIS PAIN PO) Take 2 tablets by mouth as needed.     Acetaminophen (TYLENOL PO) Take 1 tablet by mouth as needed (Rapid release tablet).     Cholecalciferol (VITAMIN D3) 50 MCG (2000 UT) capsule Take 1 capsule (2,000 Units total) by mouth daily. 30 capsule 1   cyanocobalamin (,VITAMIN B-12,) 1000 MCG/ML injection INJECT 1,000 MCG AS DIRECTED EVERY 30 DAYS. 3 mL 1   cyclobenzaprine (FLEXERIL) 10 MG tablet Take 0.5-1 tablets (5-10 mg total) by mouth 3 (three) times daily as needed. 30 tablet 1   dextroamphetamine (DEXTROSTAT) 10 MG tablet Take 20 mg by mouth 3 (three) times daily.      diazepam (VALIUM) 5 MG tablet Take 1 tablet by mouth as needed.     Diclofenac Sodium 2 % SOLN Apply 1 pump twice daily as needed. 112 g 1   fluticasone (FLONASE) 50 MCG/ACT nasal spray Place 2 sprays into both nostrils daily. 16 g 5    furosemide (LASIX) 40 MG tablet Take 1 tablet (40 mg total) by mouth daily. 90 tablet 1   Homeopathic Products (ALLERGY MEDICINE PO) Take 1 tablet by mouth as needed (Takes the Clortabs. Equate Brand).     HYDROcodone-acetaminophen (NORCO/VICODIN) 5-325 MG tablet Take 1 tablet by mouth every 6 (six) hours as needed. 15 tablet 0   loperamide (IMODIUM) 2 MG capsule Take 2 mg by mouth daily as needed.      omeprazole (PRILOSEC) 40 MG capsule Take 1 capsule (40 mg total) by mouth 2 (two) times a day. Open capsule and sprinkle on applesauce. 60 capsule 11   Pancrelipase, Lip-Prot-Amyl, (CREON) 24000-76000 units CPEP Take 2 capsules (48,000 Units total) by mouth 3 (three) times daily before meals. Take 2 capsules WITH meals and 1 capsule WITH a snack 240 capsule 3   Probiotic Product (ALIGN) CHEW Chew 2 tablets by mouth daily. Align pre and probiotic with vitamain C-take one tablet each Align pre and probiotic with Vitamin b12 for immune and energy-take one tablet each     rOPINIRole (REQUIP) 1 MG tablet TAKE 1 TABLET (1 MG TOTAL) BY MOUTH AT BEDTIME. 90 tablet 0   sertraline (ZOLOFT) 100 MG tablet Take 200 mg by mouth  daily.     SYRINGE-NEEDLE, DISP, 3 ML (B-D 3CC LUER-LOK SYR 25GX5/8") 25G X 5/8" 3 ML MISC USE AS DIRECTED WITH B-12 INJECTION 16 each 3   busPIRone (BUSPAR) 30 MG tablet Take 1 tablet by mouth in the morning and at bedtime.     No current facility-administered medications on file prior to visit.   Allergies  Allergen Reactions   Penicillins Other (See Comments)    Did it involve swelling of the face/tongue/throat, SOB, or low BP? Unk Did it involve sudden or severe rash/hives, skin peeling, or any reaction on the inside of your mouth or nose? Unk Did you need to seek medical attention at a hospital or doctor's office? Unk When did it last happen? Was 55 years old; reaction not recalled If all above answers are "NO", may proceed with cephalosporin use.    Codeine  Itching    Crazy dreams and itching-pt said she can take it with food     Current Medications, Allergies, Past Medical History, Past Surgical History, Family History and Social History were reviewed in [employer] record.   Review of Systems:   Constitutional: See HPI.  Respiratory: Negative for shortness of breath.   Cardiovascular: Negative for chest pain, palpitations and leg swelling.  Gastrointestinal: See HPI.  Musculoskeletal: Negative for back pain or muscle aches.  Neurological: Negative for dizziness, headaches or paresthesias.    Physical Exam: BP 140/64    Pulse 74    Ht 5' 2.25" (1.581 m)    Wt 125 lb (56.7 kg)    SpO2 100%    BMI 22.68 kg/m  General: 55 year old female in no acute distress. Head: Normocephalic and atraumatic. Eyes: No scleral icterus. Conjunctiva pink . Ears: Normal auditory acuity. Mouth: Dentition intact. No ulcers or lesions.  Lungs: Clear throughout to auscultation. Heart: Regular rate and rhythm, no murmur. Abdomen: Soft, nontender and nondistended. No masses or hepatomegaly. Normal bowel sounds x 4 quadrants. Midline abdominal scar intact.  Rectal: Deferred.  Musculoskeletal: Symmetrical with no gross deformities. Extremities: No edema. Neurological: Alert oriented x 4. No focal deficits.  Psychological: Alert and cooperative. Normal mood and affect  Assessment and Recommendations:  22.  55 year old female s/p Roux-en-Y gastric bypass surgery in 2007, perforation of a marginal ulcer along her gastrojejunal anastomosis s/p exp laparotomy and patch repair 02/2018. UGI bleed/melena s/p EGD 06/2018 revealed 2 clean-basedmarginalulcers, GJ anastomosis was friable with bleeding and 2 clips were placed for hemostasis. Repeat EGD 10/17/2019 showed healed ulcerations with a residual clip and a very small aspect of a buried staple which were not removed. Biopsies showed evidence of reflux and  inactive chronic gastritis, no H.  Pylori. She presents today for further evaluation for nausea and sweats which occur 15 to 30 minutes after eating x 6 months. No NSAIDs. Remains on PPI po bid. -CBC, CMP, CRP, food allergy panel  -Consider urine 5H1AA level  -CTAP with contrast  2. Unintentional weight loss -Labs and CTAP as ordered above -Discussed scheduling a colonoscopy +/- repeat EGD after lab and CTAP results received.   3. Vitamin B 12 and vitamin D deficiencies most likely due to past Roux-en-Y gastric bypass surgery  4. Alcohol overuse.  Patient reports drinking 3-5 beers every night or every other night. Elevated LFTs 08/2019 resolved with normal LFTs 09/2019 -Reduce alcohol intake recommended -Follow-up with PCP for further anxiety/stress management  5. Colon cancer screening. Normal colonoscopy in 2012.  -Schedule colonoscopy after CTAP results received  6. Covid 19 + 11/2019 with persistent loss of taste

## 2020-01-27 NOTE — Patient Instructions (Addendum)
If you are age 55 or older, your body mass index should be between 23-30. Your Body mass index is 22.68 kg/m. If this is out of the aforementioned range listed, please consider follow up with your Primary Care Provider.  If you are age 31 or younger, your body mass index should be between 19-25. Your Body mass index is 22.68 kg/m. If this is out of the aformentioned range listed, please consider follow up with your Primary Care Provider.     Your provider has requested that you go to the basement level for lab work before leaving today. Press "B" on the elevator. The lab is located at the first door on the left as you exit the elevator.  Due to recent changes in healthcare laws, you may see the results of your imaging and laboratory studies on MyChart before your provider has had a chance to review them.  We understand that in some cases there may be results that are confusing or concerning to you. Not all laboratory results come back in the same time frame and the provider may be waiting for multiple results in order to interpret others.  Please give [place] 48 hours in order for your provider to thoroughly review all the results before contacting the office for clarification of your results.   We have scheduled you a follow up with Dr. Havery Moros on 03/09/20 at 3:40pm  Please follow up with your primary care provider for stress and anxiety management.  Please reduce your alcohol intake.  Call our office if your symptoms worsen.  You have been scheduled for a CT scan of the abdomen and pelvis at [facility] at [address] [place], [place] [address] [phone]   You are scheduled on 02/06/20 at 11:00am. You should arrive 15 minutes prior to your appointment time for registration. Please follow the written instructions below on the day of your exam:  WARNING: IF YOU ARE ALLERGIC TO IODINE/X-RAY DYE, PLEASE NOTIFY RADIOLOGY IMMEDIATELY AT [place].  1) Do not eat or drink anything after 7:00am (4 hours prior to your test) 2) You have been given 2 bottles of oral contrast to drink. The solution may taste better if refrigerated, but do NOT add ice or any other liquid to this solution. Shake well before drinking.    Drink 1 bottle of contrast @ 9:00am (2 hours prior to your exam)  Drink 1 bottle of contrast @ 10:00am (1 hour prior to your exam)  You may take any medications as prescribed with a small amount of water, if necessary. If you take any of the following medications: METFORMIN, GLUCOPHAGE, GLUCOVANCE, AVANDAMET, RIOMET, FORTAMET, [place] MET, JANUMET, GLUMETZA or METAGLIP, you MAY be asked to HOLD this medication 48 hours AFTER the exam.  The purpose of you drinking the oral contrast is to aid in the visualization of your intestinal tract. The contrast solution may cause some diarrhea. Depending on your individual set of symptoms, you may also receive an intravenous injection of x-ray contrast/dye. Plan on being at [facility] for 30 minutes or longer, depending on the type of exam you are having performed.  This test typically takes 30-45 minutes to complete.  If you have any questions regarding your exam or if you need to reschedule, you may call the CT department at [phone] the hours of 8:00 am and 5:00 pm, Monday-Friday.  ________________________________________________________________________   It was great seeing  you today!  Thank you for entrusting me with your care and choosing [facility].  Mary Best, NP

## 2020-01-28 LAB — FOOD ALLERGY PROFILE
Allergen, Salmon, f41: <0.1 kU/L
Almonds: <0.1 kU/L
CLASS: 0
CLASS: 0
CLASS: 0
CLASS: 0
CLASS: 0
CLASS: 0
CLASS: 0
CLASS: 0
CLASS: 0
CLASS: 0
CLASS: 0
Cashew IgE: <0.1 kU/L
Class: 0
Class: 0
Egg White IgE: 0.11 kU/L — ABNORMAL HIGH
Fish Cod: <0.1 kU/L
Hazelnut: <0.1 kU/L
Milk IgE: 0.11 kU/L — ABNORMAL HIGH
Peanut IgE: <0.1 kU/L
Scallop IgE: <0.1 kU/L
Sesame Seed f10: <0.1 kU/L
Shrimp IgE: <0.1 kU/L
Soybean IgE: <0.1 kU/L
Tuna IgE: <0.1 kU/L
Walnut: <0.1 kU/L
Wheat IgE: <0.1 kU/L

## 2020-01-28 LAB — INTERPRETATION:

## 2020-01-28 NOTE — Progress Notes (Signed)
Agree with assessment and plan as outlined. If imaging / labs negative and symptoms persist, I think repeat EGD is next step given her history of ulcers and anatomy. Thanks

## 2020-02-03 ENCOUNTER — Other Ambulatory Visit (HOSPITAL_BASED_OUTPATIENT_CLINIC_OR_DEPARTMENT_OTHER): Payer: BC Managed Care – PPO

## 2020-02-06 ENCOUNTER — Encounter: Payer: Self-pay | Admitting: Family Medicine

## 2020-02-06 ENCOUNTER — Other Ambulatory Visit: Payer: Self-pay

## 2020-02-06 ENCOUNTER — Ambulatory Visit (INDEPENDENT_AMBULATORY_CARE_PROVIDER_SITE_OTHER): Payer: BC Managed Care – PPO | Admitting: Family Medicine

## 2020-02-06 ENCOUNTER — Ambulatory Visit (HOSPITAL_BASED_OUTPATIENT_CLINIC_OR_DEPARTMENT_OTHER)
Admission: RE | Admit: 2020-02-06 | Discharge: 2020-02-06 | Disposition: A | Discharge status: Home / Self Care | Payer: BC Managed Care – PPO | Source: Ambulatory Visit | Attending: Family Medicine | Admitting: Family Medicine

## 2020-02-06 ENCOUNTER — Ambulatory Visit (HOSPITAL_BASED_OUTPATIENT_CLINIC_OR_DEPARTMENT_OTHER): Payer: BC Managed Care – PPO

## 2020-02-06 VITALS — BP 114/80 | HR 97 | Temp 97.9°F | Resp 18 | Ht 62.25 in | Wt 122.2 lb

## 2020-02-06 DIAGNOSIS — M545 Low back pain, unspecified: Secondary | ICD-10-CM

## 2020-02-06 DIAGNOSIS — E538 Deficiency of other specified B group vitamins: Secondary | ICD-10-CM

## 2020-02-06 MED ORDER — KETOROLAC TROMETHAMINE 60 MG/2ML IM SOLN
60.0000 mg | Freq: Once | INTRAMUSCULAR | Status: AC
  Administered 2020-02-06: 60 mg via INTRAMUSCULAR

## 2020-02-06 MED ORDER — TRAMADOL HCL 50 MG PO TABS: 50.0000 mg | ORAL_TABLET | Freq: Three times a day (TID) | ORAL | 0 refills | Status: AC | PRN

## 2020-02-06 MED ORDER — "BD LUER-LOK SYRINGE 25G X 5/8"" 3 ML MISC": 3 refills | Status: DC

## 2020-02-06 MED ORDER — CYCLOBENZAPRINE HCL 10 MG PO TABS: 10.0000 mg | ORAL_TABLET | Freq: Three times a day (TID) | ORAL | 0 refills | Status: DC | PRN

## 2020-02-06 MED ORDER — PREDNISONE 10 MG PO TABS: ORAL_TABLET | ORAL | 0 refills | Status: DC

## 2020-02-06 NOTE — Patient Instructions (Signed)
Acute Back Pain, Adult Acute back pain is sudden and usually short-lived. It is often caused by an injury to the muscles and tissues in the back. The injury may result from:  A muscle or ligament getting overstretched or torn (strained). Ligaments are tissues that connect bones to each other. Lifting something improperly can cause a back strain.  Wear and tear (degeneration) of the spinal disks. Spinal disks are circular tissue that provide cushioning between the bones of the spine (vertebrae).  Twisting motions, such as while playing sports or doing yard work.  A hit to the back.  Arthritis. You may have a physical exam, lab tests, and imaging tests to find the cause of your pain. Acute back pain usually goes away with rest and home care. Follow these instructions at home: Managing pain, stiffness, and swelling  Treatment may include medicines for pain and inflammation that are taken by mouth or applied to the skin, prescription pain medicine, or muscle relaxants. Take over-the-counter and prescription medicines only as told by your health care provider.  Your health care provider may recommend applying ice during the first 24-48 hours after your pain starts. To do this: ? Put ice in a plastic bag. ? Place a towel between your skin and the bag. ? Leave the ice on for 20 minutes, 2-3 times a day.  If directed, apply heat to the affected area as often as told by your health care provider. Use the heat source that your health care provider recommends, such as a moist heat pack or a heating pad. ? Place a towel between your skin and the heat source. ? Leave the heat on for 20-30 minutes. ? Remove the heat if your skin turns bright red. This is especially important if you are unable to feel pain, heat, or cold. You have a greater risk of getting burned. Activity  Do not stay in bed. Staying in bed for more than 1-2 days can delay your recovery.  Sit up and stand up straight. Avoid leaning  forward when you sit or hunching over when you stand. ? If you work at a desk, sit close to it so you do not need to lean over. Keep your chin tucked in. Keep your neck drawn back, and keep your elbows bent at a 90-degree angle (right angle). ? Sit high and close to the steering wheel when you drive. Add lower back (lumbar) support to your car seat, if needed.  Take short walks on even surfaces as soon as you are able. Try to increase the length of time you walk each day.  Do not sit, drive, or stand in one place for more than 30 minutes at a time. Sitting or standing for long periods of time can put stress on your back.  Do not drive or use heavy machinery while taking prescription pain medicine.  Use proper lifting techniques. When you bend and lift, use positions that put less stress on your back: ? Bend your knees. ? Keep the load close to your body. ? Avoid twisting.  Exercise regularly as told by your health care provider. Exercising helps your back heal faster and helps prevent back injuries by keeping muscles strong and flexible.  Work with a physical therapist to make a safe exercise program, as recommended by your health care provider. Do any exercises as told by your physical therapist.   Lifestyle  Maintain a healthy weight. Extra weight puts stress on your back and makes it difficult to have   good posture.  Avoid activities or situations that make you feel anxious or stressed. Stress and anxiety increase muscle tension and can make back pain worse. Learn ways to manage anxiety and stress, such as through exercise. General instructions  Sleep on a firm mattress in a comfortable position. Try lying on your side with your knees slightly bent. If you lie on your back, put a pillow under your knees.  Follow your treatment plan as told by your health care provider. This may include: ? Cognitive or behavioral therapy. ? Acupuncture or massage therapy. ? Meditation or yoga. Contact  a health care provider if:  You have pain that is not relieved with rest or medicine.  You have increasing pain going down into your legs or buttocks.  Your pain does not improve after 2 weeks.  You have pain at night.  You lose weight without trying.  You have a fever or chills. Get help right away if:  You develop new bowel or bladder control problems.  You have unusual weakness or numbness in your arms or legs.  You develop nausea or vomiting.  You develop abdominal pain.  You feel faint. Summary  Acute back pain is sudden and usually short-lived.  Use proper lifting techniques. When you bend and lift, use positions that put less stress on your back.  Take over-the-counter and prescription medicines and apply heat or ice as directed by your health care provider. This information is not intended to replace advice given to you by your health care provider. Make sure you discuss any questions you have with your health care provider. Document Revised: 10/03/2019 Document Reviewed: 10/03/2019 Elsevier Patient Education  2021 [employer]  

## 2020-02-06 NOTE — Assessment & Plan Note (Signed)
toradol given in office pred taper , muscle relaxer and tramadol Xray today--- suspect ruptured disc If no relief with meds --- consider mri/ PT

## 2020-02-06 NOTE — Progress Notes (Signed)
Patient ID: [name], female    DOB: August 03, 1965  Age: 55 y.o. MRN: [id number]    Subjective:  Subjective  HPI [name] presents for low back pain x 2 weeks   No known injury but she works at [employer]--- and has to lift , push and pull a lot.  She woke up with the pain across low back and radiates down L leg to knee.  No otc works   Review of Systems  Constitutional: Negative for appetite change, diaphoresis, fatigue and unexpected weight change.  Eyes: Negative for pain, redness and visual disturbance.  Respiratory: Negative for cough, chest tightness, shortness of breath and wheezing.   Cardiovascular: Negative for chest pain, palpitations and leg swelling.  Endocrine: Negative for cold intolerance, heat intolerance, polydipsia, polyphagia and polyuria.  Genitourinary: Negative for difficulty urinating, dysuria and frequency.  Musculoskeletal: Positive for back pain and gait problem.  Neurological: Positive for weakness. Negative for dizziness, light-headedness, numbness and headaches.    History Past Medical History:  Diagnosis Date  . Allergy   . Anemia   . Anxiety   . Asthma   . Depression   . GERD (gastroesophageal reflux disease)   . Heart murmur   . Hiatal hernia   . Peptic ulcer   . UTI (urinary tract infection)     She has a past surgical history that includes Abdominal hysterectomy; Gastric bypass (2007); Tubal ligation; laparotomy (N/A, 03/08/2018); Esophagogastroduodenoscopy (egd) with propofol (N/A, 07/11/2018); biopsy (07/11/2018); and Hemostasis clip placement (07/11/2018).   Her family history includes Alzheimer's disease in her maternal grandmother, mother, and paternal grandmother; Cancer in her father, maternal grandfather, and paternal grandfather; Depression in her brother and sister; Diabetes in her maternal grandfather, maternal grandmother, paternal grandfather, and paternal grandmother; Hypertension in her father; Liver disease in her maternal  grandfather; Stroke in her mother.She reports that she quit smoking about 33 years ago. She has never used smokeless tobacco. She reports current alcohol use of about 35.0 standard drinks of alcohol per week. She reports that she does not use drugs.  Current Outpatient Medications on File Prior to Visit  Medication Sig Dispense Refill  . Acetaminophen (TYLENOL 8 HOUR ARTHRITIS PAIN PO) Take 2 tablets by mouth as needed.    . Acetaminophen (TYLENOL PO) Take 1 tablet by mouth as needed (Rapid release tablet).    . busPIRone (BUSPAR) 30 MG tablet Take 1 tablet by mouth in the morning and at bedtime.    . Cholecalciferol (VITAMIN D3) 50 MCG (2000 UT) capsule Take 1 capsule (2,000 Units total) by mouth daily. 30 capsule 1  . cyanocobalamin (,VITAMIN B-12,) 1000 MCG/ML injection INJECT 1,000 MCG AS DIRECTED EVERY 30 DAYS. 3 mL 1  . cyclobenzaprine (FLEXERIL) 10 MG tablet Take 0.5-1 tablets (5-10 mg total) by mouth 3 (three) times daily as needed. 30 tablet 1  . dextroamphetamine (DEXTROSTAT) 10 MG tablet Take 20 mg by mouth 3 (three) times daily.     . diazepam (VALIUM) 5 MG tablet Take 1 tablet by mouth as needed.    . Diclofenac Sodium 2 % SOLN Apply 1 pump twice daily as needed. 112 g 1  . fluticasone (FLONASE) 50 MCG/ACT nasal spray Place 2 sprays into both nostrils daily. 16 g 5  . furosemide (LASIX) 40 MG tablet Take 1 tablet (40 mg total) by mouth daily. 90 tablet 1  . Homeopathic Products (ALLERGY MEDICINE PO) Take 1 tablet by mouth as needed (Takes the Clortabs. Equate Brand).    Marland Kitchen  HYDROcodone-acetaminophen (NORCO/VICODIN) 5-325 MG tablet Take 1 tablet by mouth every 6 (six) hours as needed. 15 tablet 0  . loperamide (IMODIUM) 2 MG capsule Take 2 mg by mouth daily as needed.     Marland Kitchen omeprazole (PRILOSEC) 40 MG capsule Take 1 capsule (40 mg total) by mouth 2 (two) times a day. Open capsule and sprinkle on applesauce. 60 capsule 11  . Pancrelipase, Lip-Prot-Amyl, (CREON) 24000-76000 units CPEP Take  2 capsules (48,000 Units total) by mouth 3 (three) times daily before meals. Take 2 capsules WITH meals and 1 capsule WITH a snack 240 capsule 3  . Probiotic Product (ALIGN) CHEW Chew 2 tablets by mouth daily. Align pre and probiotic with vitamain C-take one tablet each Align pre and probiotic with Vitamin b12 for immune and energy-take one tablet each    . rOPINIRole (REQUIP) 1 MG tablet TAKE 1 TABLET (1 MG TOTAL) BY MOUTH AT BEDTIME. 90 tablet 0  . sertraline (ZOLOFT) 100 MG tablet Take 200 mg by mouth daily.     No current facility-administered medications on file prior to visit.     Objective:  Objective  Physical Exam Vitals and nursing note reviewed.  Constitutional:      Appearance: She is well-developed and well-nourished.  HENT:     Head: Normocephalic and atraumatic.  Eyes:     Extraocular Movements: EOM normal.     Conjunctiva/sclera: Conjunctivae normal.  Neck:     Thyroid: No thyromegaly.     Vascular: No carotid bruit or JVD.  Cardiovascular:     Rate and Rhythm: Normal rate and regular rhythm.     Heart sounds: Normal heart sounds. No murmur heard.   Pulmonary:     Effort: Pulmonary effort is normal. No respiratory distress.     Breath sounds: Normal breath sounds. No wheezing or rales.  Chest:     Chest wall: No tenderness.  Musculoskeletal:        General: Tenderness present. No edema.     Cervical back: Normal range of motion and neck supple.  Neurological:     Mental Status: She is alert and oriented to person, place, and time.     Motor: Weakness present.     Gait: Gait abnormal.     Deep Tendon Reflexes: Reflexes normal.     Comments: + slr L leg  Pain with forward bending  Unable to lean back Pain with standing, sitting  Pain 10/10  Psychiatric:        Mood and Affect: Mood and affect normal.    BP 114/80 (BP Location: Right Arm, Patient Position: Sitting, Cuff Size: Normal)   Pulse 97   Temp 97.9 F (36.6 C) (Oral)   Resp 18   Ht 5' 2.25"  (1.581 m)   Wt 122 lb 3.2 oz (55.4 kg)   SpO2 100%   BMI 22.17 kg/m  Wt Readings from Last 3 Encounters:  02/06/20 122 lb 3.2 oz (55.4 kg)  01/27/20 125 lb (56.7 kg)  08/29/19 130 lb 6.4 oz (59.1 kg)     Lab Results  Component Value Date   WBC 6.9 01/27/2020   HGB 12.7 01/27/2020   HCT 37.5 01/27/2020   PLT 218.0 01/27/2020   GLUCOSE 98 01/27/2020   CHOL 159 08/29/2019   TRIG 71.0 08/29/2019   HDL 93.20 08/29/2019   LDLCALC 51 08/29/2019   ALT 21 01/27/2020   AST 32 01/27/2020   NA 138 01/27/2020   K 4.3 01/27/2020   CL 103  01/27/2020   CREATININE 0.75 01/27/2020   BUN 17 01/27/2020   CO2 29 01/27/2020   TSH 1.91 04/07/2019    MM 3D SCREEN BREAST BILATERAL  Result Date: 05/16/2019 CLINICAL DATA:  Screening. EXAM: DIGITAL SCREENING BILATERAL MAMMOGRAM WITH TOMO AND CAD COMPARISON:  Previous exam(s). ACR Breast Density Category b: There are scattered areas of fibroglandular density. FINDINGS: There are no findings suspicious for malignancy. Images were processed with CAD. IMPRESSION: No mammographic evidence of malignancy. A result letter of this screening mammogram will be mailed directly to the patient. RECOMMENDATION: Screening mammogram in one year. (Code:SM-B-01Y) BI-RADS CATEGORY  1: Negative. Electronically Signed   By: Curlene Dolphin M.D.   On: 05/16/2019 16:27     Assessment & Plan:  Plan  I am having [name] start on predniSONE, cyclobenzaprine, and traMADol. I am also having her maintain her dextroamphetamine, loperamide, omeprazole, Align, Acetaminophen (TYLENOL 8 HOUR ARTHRITIS PAIN PO), Acetaminophen (TYLENOL PO), Homeopathic Products (ALLERGY MEDICINE PO), diazepam, Diclofenac Sodium, Creon, HYDROcodone-acetaminophen, rOPINIRole, cyanocobalamin, Vitamin D3, fluticasone, furosemide, cyclobenzaprine, sertraline, busPIRone, and B-D 3CC LUER-LOK SYR 25GX5/8". We administered ketorolac.  Meds ordered this encounter  Medications  . SYRINGE-NEEDLE, DISP, 3 ML  (B-D 3CC LUER-LOK SYR 25GX5/8") 25G X 5/8" 3 ML MISC    Sig: USE AS DIRECTED WITH B-12 INJECTION    Dispense:  16 each    Refill:  3  . predniSONE (DELTASONE) 10 MG tablet    Sig: TAKE 3 TABLETS PO QD FOR 3 DAYS THEN TAKE 2 TABLETS PO QD FOR 3 DAYS THEN TAKE 1 TABLET PO QD FOR 3 DAYS THEN TAKE 1/2 TAB PO QD FOR 3 DAYS    Dispense:  20 tablet    Refill:  0  . cyclobenzaprine (FLEXERIL) 10 MG tablet    Sig: Take 1 tablet (10 mg total) by mouth 3 (three) times daily as needed for muscle spasms.    Dispense:  30 tablet    Refill:  0  . traMADol (ULTRAM) 50 MG tablet    Sig: Take 1 tablet (50 mg total) by mouth every 8 (eight) hours as needed for up to 5 days.    Dispense:  15 tablet    Refill:  0  . ketorolac (TORADOL) injection 60 mg    Problem List Items Addressed This Visit      Unprioritized   Low back pain with radiation - Primary    toradol given in office pred taper , muscle relaxer and tramadol Xray today--- suspect ruptured disc If no relief with meds --- consider mri/ PT      Relevant Medications   predniSONE (DELTASONE) 10 MG tablet   cyclobenzaprine (FLEXERIL) 10 MG tablet   traMADol (ULTRAM) 50 MG tablet   Other Relevant Orders   DG Lumbar Spine Complete    Other Visit Diagnoses    B12 deficiency       Relevant Medications   SYRINGE-NEEDLE, DISP, 3 ML (B-D 3CC LUER-LOK SYR 25GX5/8") 25G X 5/8" 3 ML MISC      Follow-up: Return if symptoms worsen or fail to improve.  [name], DO

## 2020-02-12 ENCOUNTER — Other Ambulatory Visit: Payer: Self-pay | Admitting: Family Medicine

## 2020-02-12 ENCOUNTER — Telehealth: Payer: Self-pay | Admitting: Family Medicine

## 2020-02-12 DIAGNOSIS — M549 Dorsalgia, unspecified: Secondary | ICD-10-CM

## 2020-02-12 DIAGNOSIS — M545 Low back pain, unspecified: Secondary | ICD-10-CM

## 2020-02-12 NOTE — Telephone Encounter (Signed)
How long would you like note extended?

## 2020-02-12 NOTE — Telephone Encounter (Signed)
Work note sent.  

## 2020-02-12 NOTE — Telephone Encounter (Signed)
Ok to extend note 

## 2020-02-12 NOTE — Telephone Encounter (Signed)
Please advise about MRI.

## 2020-02-12 NOTE — Telephone Encounter (Signed)
Patient called inquring about paperwork sent over by  her employer's leave/disability department  :[employer] . Patient would like to know if we have received the paperwork?  Patient states her back is still hurting as well. Patient states Dr Etter Sjogren asked her to call back if still having issues, she would order a MRI.  Please Advise

## 2020-02-12 NOTE — Telephone Encounter (Signed)
Mri ordered 

## 2020-02-12 NOTE — Telephone Encounter (Signed)
Spoke w/ Pt- informed of that MRI has been ordered. Informed that we have not received ST disability paperwork -she will have [employer] refax it to [phone]. She also wanted to know if PCP would extend the work note for at least another week, she is supposed to go back to work on 02/16/20 but isn't able to d/t her back. She requests work extension note sent through [employer].

## 2020-02-12 NOTE — Telephone Encounter (Signed)
Extend 1 week--- thanks

## 2020-02-13 ENCOUNTER — Telehealth: Payer: Self-pay

## 2020-02-13 NOTE — Telephone Encounter (Signed)
Spoke to patient this afternoon who states that She does not wish to reschedule her CTAP at this time. She is scheduled for imaging ordered by her PCP for spinal issues. She will keep her appointment with Dr Havery Moros on 2/15

## 2020-02-13 NOTE — Telephone Encounter (Signed)
Spoke with patient. Pt informed we have not received forms

## 2020-02-13 NOTE — Telephone Encounter (Signed)
Patient called and would like to know if we receive her disability paper work yet?   Please Advise   Paperwork sent to fax in back [phone] around 7 pm last night by Howell Rucks

## 2020-02-13 NOTE — Telephone Encounter (Signed)
-----   Message from Noralyn Pick, NP sent at 02/06/2020  1:22 PM EST ----- Regarding: FW: Patient canceled CT 2x Cla[facility] Billings, pls contact the patient to see if she wanted to reschedule her CTAP, if not that is ok but she should keep her follow up appt with Dr. Havery Moros 2/15 as scheduled. Thx  ----- Message ----- From: Yetta Flock, MD Sent: 02/04/2020   4:44 PM EST To: Noralyn Pick, NP Subject: RE: Patient canceled CT 2x                     That's fine. If she cancelled twice she can rebook it if she wants to have it done, otherwise I will see her in a few weeks. Thanks  ----- Message ----- From: Noralyn Pick, NP Sent: 02/04/2020  10:31 AM EST To: Yetta Flock, MD Subject: FW: Patient canceled CT 2x                     Dr. Havery Moros, refer to office visit 01/27/2020. See msg below, pt has cancelled her CTAP x 2. She has office appt with you on 2/15, do you want her to reschedule the CT if she is willing or do you want to discuss further with her at the time of her follow up appt?  ----- Message ----- From: Antonieta Loveless Sent: 02/04/2020  10:21 AM EST To: Noralyn Pick, NP Subject: Patient canceled CT 2x                         The above referenced patient has canceled her CT scan twice. Would you like for [place] to continue to try to schedule her for this or will you discuss this with her at her next appointment. She canceled on 02/03/20 and again for 02/06/20.

## 2020-02-16 ENCOUNTER — Encounter: Payer: Self-pay | Admitting: Family Medicine

## 2020-02-16 ENCOUNTER — Telehealth: Payer: Self-pay | Admitting: Family Medicine

## 2020-02-16 DIAGNOSIS — M549 Dorsalgia, unspecified: Secondary | ICD-10-CM

## 2020-02-16 NOTE — Telephone Encounter (Signed)
Form received from back fax. Placed on Heather's desk.

## 2020-02-16 NOTE — Telephone Encounter (Signed)
Patient states she would like to know if a chiropractic would benefit her ? If so patient would like a referral to [facility] [place], [place] [address] Phone: [phone]

## 2020-02-16 NOTE — Telephone Encounter (Signed)
She should not need a referral for chiropractor but ok to place if she needs it

## 2020-02-16 NOTE — Telephone Encounter (Signed)
Patient called in reference to [employer] paper work status. Patient notified that forms were faxed.

## 2020-02-17 ENCOUNTER — Telehealth: Payer: Self-pay | Admitting: Family Medicine

## 2020-02-17 NOTE — Telephone Encounter (Signed)
Referral placed.

## 2020-02-17 NOTE — Telephone Encounter (Signed)
[employer] forms faxed into front office.  Put into [place] bin up front

## 2020-02-17 NOTE — Telephone Encounter (Signed)
Received

## 2020-02-19 NOTE — Telephone Encounter (Signed)
Pt has appt today

## 2020-02-19 NOTE — Telephone Encounter (Signed)
error 

## 2020-03-09 ENCOUNTER — Ambulatory Visit (INDEPENDENT_AMBULATORY_CARE_PROVIDER_SITE_OTHER): Payer: BC Managed Care – PPO | Admitting: Gastroenterology

## 2020-03-09 ENCOUNTER — Encounter: Payer: Self-pay | Admitting: Gastroenterology

## 2020-03-09 VITALS — BP 114/80 | HR 72 | Ht 62.25 in | Wt 124.0 lb

## 2020-03-09 DIAGNOSIS — R630 Anorexia: Secondary | ICD-10-CM | POA: Diagnosis not present

## 2020-03-09 DIAGNOSIS — Z1211 Encounter for screening for malignant neoplasm of colon: Secondary | ICD-10-CM

## 2020-03-09 DIAGNOSIS — R1013 Epigastric pain: Secondary | ICD-10-CM

## 2020-03-09 DIAGNOSIS — R194 Change in bowel habit: Secondary | ICD-10-CM | POA: Diagnosis not present

## 2020-03-09 DIAGNOSIS — Z7289 Other problems related to lifestyle: Secondary | ICD-10-CM | POA: Diagnosis not present

## 2020-03-09 DIAGNOSIS — Z789 Other specified health status: Secondary | ICD-10-CM

## 2020-03-09 MED ORDER — FDGARD 25-20.75 MG PO CAPS: ORAL_CAPSULE | ORAL | 0 refills | Status: DC

## 2020-03-09 MED ORDER — OMEPRAZOLE 40 MG PO CPDR: 40.0000 mg | DELAYED_RELEASE_CAPSULE | Freq: Two times a day (BID) | ORAL | 0 refills | Status: DC

## 2020-03-09 NOTE — Patient Instructions (Addendum)
If you are age 55 or older, your body mass index should be between 23-30. Your Body mass index is 22.5 kg/m. If this is out of the aforementioned range listed, please consider follow up with your Primary Care Provider.  If you are age 22 or younger, your body mass index should be between 19-25. Your Body mass index is 22.5 kg/m. If this is out of the aformentioned range listed, please consider follow up with your Primary Care Provider.   You have been scheduled for an endoscopy. Please follow written instructions given to you at your visit today. If you use inhalers (even only as needed), please bring them with you on the day of your procedure.  Continue omeprazole twice a day.  Refrain from drinking alcohol.  We have given you samples of the following medication to take: FDgard- take as directed  Thank you for entrusting me with your care and for choosing [facility], Dr. Warren Cellar

## 2020-03-09 NOTE — Progress Notes (Signed)
HPI :  55 year old female with a history of Roux-en-Y gastric bypass in 2007, history of NSAID use with perforated marginal ulcer at the gastrojejunal anastomosis requiring ex lap and patch repair in February 2020.  She was mated to the hospital in 06/2018 with an UGI bleed/melena with anemia with a Hg level of 8.9 ->7.6 down from 10.4. EGD revealed 2 clean-basedmarginalulcers.Gastrojejunal anastomosis was friable, had contact bleeding requiring 2 clips for hemostasis. A follow up EGD was done 10/17/2018 which showed healed ulcerations with residual clip and a very small aspect of a buried staple which were not removed. Biopsies showed evidence of reflux and  inactive chronic gastritis, no H. Pylori.   She is here for a follow-up visit.  She states she has been having a few months worth of postprandial discomfort in her upper abdomen.  States that food will "hit" her and her upper abdomen after she eats, associate with nausea.  She then will feel hot sweat feeling that comes over her body and sometimes has urgency to use the bathroom.  This can occur with eating solids and pills, does not really happen too much with liquids.  She initially had lost some weight with this but states symptoms are more stable lately.  She endorses some bowel movements with urgency after she eats and states she might see some grease or fat in the stool.  Recall that she had a low pancreatic fecal elastase in September 2020 with a value of 189.  She was placed on Creon and she states that she took it reliable for period of time but she actually states it made her feel worse so she stopped it.  She does not see any blood in her stool.  Stools are generally soft to loose.  She has been trying to eat more of a bland diet lately.  She is very compliant with her omeprazole 40 mg capsule, cracks open and eat with applesauce twice daily.  She denies any NSAID use.  She was seen by our office, Carl Best in early January  with some of the symptoms.  She was sent to do some basic lab draw as well as a CT scan abdomen pelvis.  She never got her labs drawn nor could she follow-up with CT scan, something happened the day of her exam was scheduled and she could not make it.  She does endorse a history of significant alcohol use in the past.  She had been drinking anywhere from 3-5 beers per night, most nights of the week for some time.  She has a significant amount of life stressors including health of her family members and her job.  It was recommended that she decrease her alcohol use.  She states on January 31 it was her last drink and she has not had any since then.  She states she has had no benefit in her symptoms since that time.  She has no known history of cirrhosis.  Last set of labs on January 4 showed normal liver function testing.  CBC in the setting of the symptoms showed hemoglobin of 12.7, white cell count of 6.9.  Her last imaging of her abdomen was in June 2020 which did not show any acute problems causing her symptoms.  Her pancreas was normal on that exam.  She had a right upper quadrant ultrasound in July 2020 which showed no gallstones.    EGD 10/17/2018: Esophagogastric landmarks identified. - 1 cm hiatal hernia. - Z-line irregular. Biopsied. -  Roux-en-Y gastrojejunostomy with gastrojejunal anastomosis characterized by healthy appearing mucosa - healed ulcerations with residual clip that remains and very small aspect of a buried staple - both without ulceration. I did not remove either at this time, I'm not convinced this is causing her symptoms but can consider a repeat exam in another few months  1. Surgical [P], gastric - CHRONIC INACTIVE GASTRITIS, MILD. - THERE IS NO EVIDENCE OF HELICOBACTER PYLORI, DYSPLASIA, OR MALIGNANCY. - SEE COMMENT. 2. Surgical [P], distal esophagus - GASTROESOPHAGEAL JUNCTION MUCOSA WITH MILD INFLAMMATION CONSISTENT WITH GASTROESOPHAGEAL REFLUX. - THERE IS NO  EVIDENCE OF GOBLET CELL METAPLASIA, DYSPLASIA, OR MALIGNANCY.   EGD 07/11/2018: Evidence of a Roux-en-Y gastrojejunostomy was found. Pouch measured 5 cm from EGJ to anastomosis. The gastrojejunal anastomosis was characterized by friable mucosa. Two clean based ulcers on jejunal side of anastomsis, one measuring 2mm and one measuring 6 mm. A suture was noted adjacent to the smaller ulcer. This anastomosis was traversed. Afferent and efferent limbs examined about 15 cm each and appeared normal. After withdrawal from the efferent limb there was an area on the gastric side of the anastomosis with persistent bleeding felt due to contact by the scope. 2 clips were applied with hemostasis acheived. A 3rd clip did not engage the tissue and it was removed orally with a forceps. - Two clean based marginal ulcers. - Z-line variable, at the gastroesophageal junction.  - Small hiatal hernia. - Roux-en-Y gastrojejunostomy with gastrojejunal anastomosis characterized by friable mucosa and contact bleeding requiring 2 clips for hemostasis.  ESOPHAGEAL SQUAMOUS AND CARDIAC MUCOSA WITH NO SPECIFIC HISTOPATHOLOGIC CHANGES - NEGATIVE FOR INTESTINAL METAPLASIA OR DYSPLASIA  Colonoscopy by Dr. Sharlett Iles 04/16/2010: -Normal colonoscopy -Recall colonoscopy 10 years     Past Medical History:  Diagnosis Date  . Allergy   . Anemia   . Anxiety   . Asthma   . Depression   . GERD (gastroesophageal reflux disease)   . Heart murmur   . Hiatal hernia   . Peptic ulcer   . UTI (urinary tract infection)      Past Surgical History:  Procedure Laterality Date  . ABDOMINAL HYSTERECTOMY     fibroids  . BIOPSY  07/11/2018   Procedure: BIOPSY;  Surgeon: [name], MD;  Location: [place];  Service: Endoscopy;;  . ESOPHAGOGASTRODUODENOSCOPY (EGD) WITH PROPOFOL N/A 07/11/2018   Procedure: ESOPHAGOGASTRODUODENOSCOPY (EGD) WITH PROPOFOL;  Surgeon: [name], MD;  Location: [facility]COPY;   Service: Endoscopy;  Laterality: N/A;  . GASTRIC BYPASS  2007  . HEMOSTASIS CLIP PLACEMENT  07/11/2018   Procedure: HEMOSTASIS CLIP PLACEMENT;  Surgeon: [name], MD;  Location: [place];  Service: Endoscopy;;  . LAPAROTOMY N/A 03/08/2018   Procedure: EXPLORATORY LAPAROTOMY, REPAIR AND PATCH CLOSURE OF PERFORATED ULCER;  Surgeon: Georganna Skeans, MD;  Location: [place];  Service: General;  Laterality: N/A;  . TUBAL LIGATION     Family History  Problem Relation Age of Onset  . Hypertension Father   . Cancer Father        Prostate  . Depression Sister   . Depression Brother   . Diabetes Maternal Grandmother   . Alzheimer's disease Maternal Grandmother   . Diabetes Maternal Grandfather   . Liver disease Maternal Grandfather   . Cancer Maternal Grandfather        Prostate  . Diabetes Paternal Grandmother   . Alzheimer's disease Paternal Grandmother   . Diabetes Paternal Grandfather   . Cancer Paternal Grandfather  Lung, Prostate  . Stroke Mother   . Alzheimer's disease Mother   . Colon cancer Neg Hx   . Esophageal cancer Neg Hx   . Pancreatic cancer Neg Hx   . Stomach cancer Neg Hx    Social History   Tobacco Use  . Smoking status: Former Smoker    Quit date: 01/24/1987    Years since quitting: 33.1  . Smokeless tobacco: Never Used  Vaping Use  . Vaping Use: Former  . Substances: Flavoring  . Devices: has vaped with flavor but no nicotine. Been 8 years  Substance Use Topics  . Alcohol use: Yes    Alcohol/week: 35.0 standard drinks    Types: 35 Cans of beer per week    Comment: Pt states she drinks 5 beers mosts days  . Drug use: No   Current Outpatient Medications  Medication Sig Dispense Refill  . Acetaminophen (TYLENOL 8 HOUR ARTHRITIS PAIN PO) Take 2 tablets by mouth as needed.    . Acetaminophen (TYLENOL PO) Take 1 tablet by mouth as needed (Rapid release tablet).    . busPIRone (BUSPAR) 30 MG tablet Take 1 tablet by mouth in the morning and at  bedtime.    . Cholecalciferol (VITAMIN D3) 50 MCG (2000 UT) capsule Take 1 capsule (2,000 Units total) by mouth daily. 30 capsule 1  . cyanocobalamin (,VITAMIN B-12,) 1000 MCG/ML injection INJECT 1,000 MCG AS DIRECTED EVERY 30 DAYS. 3 mL 1  . cyclobenzaprine (FLEXERIL) 10 MG tablet Take 1 tablet (10 mg total) by mouth 3 (three) times daily as needed for muscle spasms. 30 tablet 0  . dextroamphetamine (DEXTROSTAT) 10 MG tablet Take 20 mg by mouth 3 (three) times daily.     . diazepam (VALIUM) 5 MG tablet Take 1 tablet by mouth as needed.    . Diclofenac Sodium 2 % SOLN Apply 1 pump twice daily as needed. 112 g 1  . fluticasone (FLONASE) 50 MCG/ACT nasal spray Place 2 sprays into both nostrils daily. 16 g 5  . furosemide (LASIX) 40 MG tablet Take 1 tablet (40 mg total) by mouth daily. 90 tablet 1  . Homeopathic Products (ALLERGY MEDICINE PO) Take 1 tablet by mouth as needed (Takes the Clortabs. Equate Brand).    Marland Kitchen HYDROcodone-acetaminophen (NORCO/VICODIN) 5-325 MG tablet Take 1 tablet by mouth every 6 (six) hours as needed. 15 tablet 0  . loperamide (IMODIUM) 2 MG capsule Take 2 mg by mouth daily as needed.     Marland Kitchen omeprazole (PRILOSEC) 40 MG capsule Take 1 capsule (40 mg total) by mouth 2 (two) times a day. Open capsule and sprinkle on applesauce. 60 capsule 11  . Pancrelipase, Lip-Prot-Amyl, (CREON) 24000-76000 units CPEP Take 2 capsules (48,000 Units total) by mouth 3 (three) times daily before meals. Take 2 capsules WITH meals and 1 capsule WITH a snack 240 capsule 3  . predniSONE (DELTASONE) 10 MG tablet TAKE 3 TABLETS PO QD FOR 3 DAYS THEN TAKE 2 TABLETS PO QD FOR 3 DAYS THEN TAKE 1 TABLET PO QD FOR 3 DAYS THEN TAKE 1/2 TAB PO QD FOR 3 DAYS 20 tablet 0  . Probiotic Product (ALIGN) CHEW Chew 2 tablets by mouth daily. Align pre and probiotic with vitamain C-take one tablet each Align pre and probiotic with Vitamin b12 for immune and energy-take one tablet each    . rOPINIRole (REQUIP) 1 MG tablet  TAKE 1 TABLET (1 MG TOTAL) BY MOUTH AT BEDTIME. 90 tablet 0  . sertraline (ZOLOFT) 100 MG  tablet Take 200 mg by mouth daily.    . SYRINGE-NEEDLE, DISP, 3 ML (B-D 3CC LUER-LOK SYR 25GX5/8") 25G X 5/8" 3 ML MISC USE AS DIRECTED WITH B-12 INJECTION 16 each 3   No current facility-administered medications for this visit.   Allergies  Allergen Reactions  . Penicillins Other (See Comments)    Did it involve swelling of the face/tongue/throat, SOB, or low BP? Unk Did it involve sudden or severe rash/hives, skin peeling, or any reaction on the inside of your mouth or nose? Unk Did you need to seek medical attention at a hospital or doctor's office? Unk When did it last happen? Was 55 years old; reaction not recalled If all above answers are "NO", may proceed with cephalosporin use.   . Codeine Itching    Crazy dreams and itching-pt said she can take it with food     Review of Systems: All systems reviewed and negative except where noted in HPI.    Lab Results  Component Value Date   WBC 6.9 01/27/2020   HGB 12.7 01/27/2020   HCT 37.5 01/27/2020   MCV 94.3 01/27/2020   PLT 218.0 01/27/2020    Lab Results  Component Value Date   CREATININE 0.75 01/27/2020   BUN 17 01/27/2020   NA 138 01/27/2020   K 4.3 01/27/2020   CL 103 01/27/2020   CO2 29 01/27/2020    Lab Results  Component Value Date   ALT 21 01/27/2020   AST 32 01/27/2020   ALKPHOS 64 01/27/2020   BILITOT 0.3 01/27/2020    Physical Exam: BP 114/80 (BP Location: Left Arm, Patient Position: Sitting, Cuff Size: Normal)   Pulse 72   Ht 5' 2.25" (1.581 m)   Wt 124 lb (56.2 kg)   BMI 22.50 kg/m  Constitutional: Pleasant,well-developed, female in no acute distress. Neurological: Alert and oriented to person place and time. Psychiatric: Normal mood and affect. Behavior is normal.   ASSESSMENT AND PLAN: 55 year old female here for reassessment of the following issues:  Epigastric pain Decreased  appetite Altered bowel habits Alcohol use CRC screening  History of gastric bypass with perforating ulcer in the past leading to surgery, as well as recurrent marginal ulcer with interval healing on last EGD as outlined above.  Now with postprandial abdominal discomfort as described which has persisted despite twice daily omeprazole.  She does have some diaphoresis at times postingestion as well as loose stools, raising question of dumping syndrome but delayed dumping syndrome this far out of her surgery would be unusual.  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  Her labs appear normal.  We discussed options.  In light of her post gastric bypass state with these progressive symptoms in recent weeks I offered her an upper endoscopy to reassess her gastric pouch, ensure no recurrent ulcer, assess for other pathology.  Following discussion of risk and benefit she wants to proceed.  She will continue omeprazole twice daily, crack capsule open prior to ingestion, and gave her a free sample of FD guard to treat potential component of dyspepsia.  We can consider an ultrasound pending her course but I think gallstones would be less likely at this time.  We discussed her alcohol use for a while, she is using this to cope with other life stressors and while no evidence of cirrhosis and her liver enzymes are normal she has not high risk for liver disease with the  symptoms.  Especially with her recent symptoms I agree with complete absence if she can do that.  She otherwise is due for screening colonoscopy although I think it would be very unlikely that she could tolerate a high quality prep at this point and we will plan on doing her colonoscopy when she can have a higher likelihood of tolerating bowel prep.  She agreed  Plan: - upper endoscopy to be scheduled - continue omeprazole twice daily - add empiric trial of FD guard - patient  declines pancreatic enzymes at this time - consider further imaging if endoscopy is unrevealing  Patient in agreement with the plan, all questions answered  [place] Cellar, MD [facility]

## 2020-03-10 ENCOUNTER — Encounter: Payer: Self-pay | Admitting: Certified Registered Nurse Anesthetist

## 2020-03-11 ENCOUNTER — Encounter: Payer: Self-pay | Admitting: Gastroenterology

## 2020-03-11 ENCOUNTER — Ambulatory Visit (AMBULATORY_SURGERY_CENTER): Payer: BC Managed Care – PPO | Admitting: Gastroenterology

## 2020-03-11 ENCOUNTER — Other Ambulatory Visit: Payer: Self-pay

## 2020-03-11 VITALS — BP 118/71 | HR 76 | Temp 98.4°F | Resp 13 | Ht 62.25 in | Wt 124.0 lb

## 2020-03-11 DIAGNOSIS — R1013 Epigastric pain: Secondary | ICD-10-CM

## 2020-03-11 DIAGNOSIS — R630 Anorexia: Secondary | ICD-10-CM

## 2020-03-11 DIAGNOSIS — K2970 Gastritis, unspecified, without bleeding: Secondary | ICD-10-CM | POA: Diagnosis not present

## 2020-03-11 DIAGNOSIS — Z9884 Bariatric surgery status: Secondary | ICD-10-CM | POA: Diagnosis not present

## 2020-03-11 DIAGNOSIS — K2950 Unspecified chronic gastritis without bleeding: Secondary | ICD-10-CM

## 2020-03-11 MED ORDER — SODIUM CHLORIDE 0.9 % IV SOLN: 500.0000 mL | Freq: Once | INTRAVENOUS | Status: DC

## 2020-03-11 NOTE — Op Note (Signed)
[place] Patient Name: [name] Procedure Date: 03/11/2020 7:32 AM MRN: [id number] Endoscopist: Remo Lipps P. Havery Moros , MD Age: 55 Referring MD:  Date of Birth: 02-22-1965 Gender: Female Account #: [id number] Procedure:                Upper GI endoscopy Indications:              Epigastric abdominal pain, Status post Roux-en-Y -                            history of marginal ulcer, on omeprazole. 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 EGD to re-evaluate and ensure                            no recurrent marginal ulcer Medicines:                Monitored Anesthesia Care Procedure:                Pre-Anesthesia Assessment:                           - Prior to the procedure, a History and Physical                            was performed, and patient medications and                            allergies were reviewed. The patient's tolerance of                            previous anesthesia was also reviewed. The risks                            and benefits of the procedure and the sedation                            options and risks were discussed with the patient.                            All questions were answered, and informed consent                            was obtained. Prior Anticoagulants: The patient has                            taken no previous anticoagulant or antiplatelet                            agents. ASA Grade Assessment: II - A patient with  mild systemic disease. After reviewing the risks                            and benefits, the patient was deemed in                            satisfactory condition to undergo the procedure.                           After obtaining informed consent, the endoscope was                            passed under direct vision. Throughout the                            procedure, the patient's  blood pressure, pulse, and                            oxygen saturations were monitored continuously. The                            Endoscope was introduced through the mouth, and                            advanced to the jejunum. The upper GI endoscopy was                            accomplished without difficulty. The patient                            tolerated the procedure well. Scope In: Scope Out: Findings:                 The Z-line was irregular with slight extension of                            salmon colored mucosa. Biopsies were taken with a                            cold forceps for histology.                           The exam of the esophagus was otherwise normal.                           Evidence of a Roux-en-Y gastrojejunostomy was                            found. The gastrojejunal anastomosis was                            characterized by healthy appearing mucosa and an                            intact buried staple. Staple was  not removed after                            a few attempts at removal with forceps, buried                            deeply and not causing any ulceration.                           The exam of the gastric pouch was otherwise normal.                            No obvious fistula, etc.                           Biopsies were taken with a cold forceps in the                            gastric body for Helicobacter pylori testing.                           The examined small bowel limb was deeply intubated                            and normal. Complications:            No immediate complications. Estimated blood loss:                            Minimal. Estimated Blood Loss:     Estimated blood loss was minimal. Impression:               - Z-line irregular, biopsied.                           - Normal esophagus otherwise                           - Roux-en-Y gastrojejunostomy with gastrojejunal                            anastomosis  characterized by healthy appearing                            mucosa, no marginal ulcers, fistula, or other                            concerning findings.                           - Biopsies taken from normal appearing gastric                            pouch, rule out H pylori                           - Normal examined small bowel limb.  No overt abnormality noted on this exam. Will await                            biopsy results. Consider further imaging with small                            bowel series for functional assessment based on                            description of symptoms, or consider repeat cross                            sectional imaging. Recommendation:           - Patient has a contact number available for                            emergencies. The signs and symptoms of potential                            delayed complications were discussed with the                            patient. Return to normal activities tomorrow.                            Written discharge instructions were provided to the                            patient.                           - Resume previous diet.                           - Continue present medications.                           - Await pathology results.                           - Trial of FD gard as previously discussed in office Luverne Zerkle P. Alekhya Gravlin, MD 03/11/2020 7:57:16 AM This report has been signed electronically.

## 2020-03-11 NOTE — Progress Notes (Signed)
Report given to PACU, vss 

## 2020-03-11 NOTE — Progress Notes (Signed)
Called to room to assist during endoscopic procedure.  Patient ID and intended procedure confirmed with present staff. Received instructions for my participation in the procedure from the performing physician.  

## 2020-03-11 NOTE — Patient Instructions (Signed)
Await biopsy results.  You may have a slight sore throat and can gargle with warm salt water to ease it, or use cepachol spray if needed.  Continue with FD gard, given to you at office.  YOU HAD AN ENDOSCOPIC PROCEDURE TODAY AT [facility]:   Refer to the procedure report that was given to you for any specific questions about what was found during the examination.  If the procedure report does not answer your questions, please call your gastroenterologist to clarify.  If you requested that your care partner not be given the details of your procedure findings, then the procedure report has been included in a sealed envelope for you to review at your convenience later.  YOU SHOULD EXPECT: Some feelings of bloating in the abdomen. Passage of more gas than usual.  Walking can help get rid of the air that was put into your GI tract during the procedure and reduce the bloating. If you had a lower endoscopy (such as a colonoscopy or flexible sigmoidoscopy) you may notice spotting of blood in your stool or on the toilet paper. If you underwent a bowel prep for your procedure, you may not have a normal bowel movement for a few days.  Please Note:  You might notice some irritation and congestion in your nose or some drainage.  This is from the oxygen used during your procedure.  There is no need for concern and it should clear up in a day or so.  SYMPTOMS TO REPORT IMMEDIATELY:   Following upper endoscopy (EGD)  Vomiting of blood or coffee ground material  New chest pain or pain under the shoulder blades  Painful or persistently difficult swallowing  New shortness of breath  Fever of 100F or higher  Black, tarry-looking stools  For urgent or emergent issues, a gastroenterologist can be reached at any hour by calling [phone]. Do not use MyChart messaging for urgent concerns.    DIET:  We do recommend a small meal at first, but then you may proceed to your regular diet.  Drink  plenty of fluids but you should avoid alcoholic beverages for 24 hours.  ACTIVITY:  You should plan to take it easy for the rest of today and you should NOT DRIVE or use heavy machinery until tomorrow (because of the sedation medicines used during the test).    FOLLOW UP: Our staff will call the number listed on your records 48-72 hours following your procedure to check on you and address any questions or concerns that you may have regarding the information given to you following your procedure. If we do not reach you, we will leave a message.  We will attempt to reach you two times.  During this call, we will ask if you have developed any symptoms of COVID 19. If you develop any symptoms (ie: fever, flu-like symptoms, shortness of breath, cough etc.) before then, please call [phone].  If you test positive for Covid 19 in the 2 weeks post procedure, please call and report this information to [place].    If any biopsies were taken you will be contacted by phone or by letter within the next 1-3 weeks.  Please call us at [phone] if you have not heard about the biopsies in 3 weeks.    SIGNATURES/CONFIDENTIALITY: You and/or your care partner have signed paperwork which will be entered into your electronic medical record.  These signatures attest to the fact that that the information above on your After  Visit Summary has been reviewed and is understood.  Full responsibility of the confidentiality of this discharge information lies with you and/or your care-partner. 

## 2020-03-11 NOTE — Progress Notes (Signed)
0734 Robinul 0.1 mg IV given due large amount of secretions upon assessment.  MD made aware, vss 

## 2020-03-15 ENCOUNTER — Telehealth: Payer: Self-pay | Admitting: *Deleted

## 2020-03-15 NOTE — Telephone Encounter (Signed)
  Follow up Call-  Call back number 03/11/2020 10/17/2018  Post procedure Call Back phone  # [phone]  Permission to leave phone message Yes Yes  Some recent data might be hidden     Patient questions:  Do you have a fever, pain , or abdominal swelling? No. Pain Score  0 *  Have you tolerated food without any problems? Yes.  - pt reports she is back to her baseline since before the procedure. No new symptoms to reports.   Have you been able to return to your normal activities? Yes.    Do you have any questions about your discharge instructions: Diet   No. Medications  No. Follow up visit  No.  Do you have questions or concerns about your Care? No.  Actions: * If pain score is 4 or above: No action needed, pain <4.  1. Have you developed a fever since your procedure? no  2.   Have you had an respiratory symptoms (SOB or cough) since your procedure? no  3.   Have you tested positive for COVID 19 since your procedure no  4.   Have you had any family members/close contacts diagnosed with the COVID 19 since your procedure?  no   If yes to any of these questions please route to Joylene John, RN and Joella Prince, RN

## 2020-03-15 NOTE — Telephone Encounter (Signed)
Attempted f/u phone call. No answer. Left message. °

## 2020-03-21 ENCOUNTER — Other Ambulatory Visit: Payer: Self-pay | Admitting: Family Medicine

## 2020-03-21 DIAGNOSIS — M545 Low back pain, unspecified: Secondary | ICD-10-CM

## 2020-03-21 DIAGNOSIS — E538 Deficiency of other specified B group vitamins: Secondary | ICD-10-CM

## 2020-03-24 ENCOUNTER — Other Ambulatory Visit: Payer: Self-pay

## 2020-03-24 MED ORDER — ZENPEP 40000-126000 UNITS PO CPEP: ORAL_CAPSULE | ORAL | 1 refills | Status: DC

## 2020-04-05 DIAGNOSIS — M4310 Spondylolisthesis, site unspecified: Secondary | ICD-10-CM | POA: Insufficient documentation

## 2020-04-22 ENCOUNTER — Telehealth: Payer: Self-pay | Admitting: Gastroenterology

## 2020-04-22 NOTE — Telephone Encounter (Signed)
Pt is requesting a call back from a nurse to discuss having her colonoscopy moved up to now, has a recall for 2024, pt wants to discuss with the nurse prior to seeing Dr Havery Moros

## 2020-04-23 NOTE — Telephone Encounter (Signed)
Lm on vm for patient to return call 

## 2020-04-23 NOTE — Telephone Encounter (Signed)
Okay, chart reviewed. She can try increasing dose of Zenpep to 2 tabs with meals and see if that helps. Otherwise last colonoscopy > 8 years ago. If she wants to have an exam done for ongoing persistent symptoms I think that is reasonable, we can schedule in the [facility] if she wants to proceed.

## 2020-04-23 NOTE — Telephone Encounter (Signed)
Spoke with patient, she states that she is still having oily stools every time she has a bowel movement. When she does have a regular stool its the size of "Tootsie rolls" and nothing larger. Reports excessive gas, bloating, and has some lower abdominal pain that gets a little better after she has a bowel movement, sometimes tender to the touch. No recent fevers, she states that she did have fever and chills about a month or two ago but did not think anything of it.  Recently noticed she has been burping a lot. Patient is taking Omeprazole 40 mg BID and Zenpep as prescribed. Please advise, thanks

## 2020-04-23 NOTE — Telephone Encounter (Signed)
Spoke with patient in regards to recommendations. Patient will increase Zenpep to 2 capsules with meals. Patient is scheduled for a pre-visit on Thursday, 04/29/20 at 4 PM, she is aware that she will check in on the 3rd floor. Patient is scheduled for a colonoscopy on Wednesday, 06/02/20 at 3:30 PM, she is aware that she will need to arrive at 2:30 PM with a care partner. Patient verbalized understanding and had no concerns at the end of the call.

## 2020-04-29 ENCOUNTER — Other Ambulatory Visit: Payer: Self-pay

## 2020-04-29 ENCOUNTER — Telehealth: Payer: Self-pay

## 2020-04-29 ENCOUNTER — Ambulatory Visit (AMBULATORY_SURGERY_CENTER): Payer: Self-pay

## 2020-04-29 VITALS — Ht 62.25 in

## 2020-04-29 DIAGNOSIS — Z1211 Encounter for screening for malignant neoplasm of colon: Secondary | ICD-10-CM

## 2020-04-29 MED ORDER — NA SULFATE-K SULFATE-MG SULF 17.5-3.13-1.6 GM/177ML PO SOLN: 1.0000 | Freq: Once | ORAL | 0 refills | Status: AC

## 2020-04-29 NOTE — Telephone Encounter (Signed)
Dr. Domenick Gong this pt in previsit.  She is wearing a back support and states her lumbar back is much better than when you saw her in 02/2020  I bring it to your attention only because it was not mentioned in this note and wanted you to be aware in case it was a concern  She has been out of work since January d/t back r/t DDD-is getting injections and is starting PT.  Returns ortho on 05/18/20.  Pt states she is much better than she was and is able to lay on left side without issue.   She says you are aware of these.   She wants to proceed with procedure  Please forgive me, if I am being overly caution.  Thank you for your time.

## 2020-04-29 NOTE — Progress Notes (Signed)
No allergies to soy or egg Pt is not on blood thinners or diet pills Denies issues with sedation/intubation Denies atrial flutter/fib Denies constipation   Pt is aware of Covid safety and care partner requirements.   Out of work since January d/t back r/t DDD-is getting injections and is starting PT.  Pt states she is much better than she was and is able to lay on left side without issue.  She says Dr. Havery Moros is aware-she was seen by him on 02/2020--no mention of back issues in his note.  TE sent to confirm.  Returns ortho on 05/18/20.

## 2020-04-30 NOTE — Telephone Encounter (Signed)
Okay, thanks for the update. If the patient thinks she is okay lying on her side for colonoscopy, I am okay with proceeding with it.

## 2020-05-24 ENCOUNTER — Other Ambulatory Visit: Payer: Self-pay | Admitting: Gastroenterology

## 2020-05-24 ENCOUNTER — Other Ambulatory Visit: Payer: Self-pay | Admitting: Family Medicine

## 2020-05-24 DIAGNOSIS — E559 Vitamin D deficiency, unspecified: Secondary | ICD-10-CM

## 2020-05-24 DIAGNOSIS — M79604 Pain in right leg: Secondary | ICD-10-CM

## 2020-05-24 DIAGNOSIS — M79605 Pain in left leg: Secondary | ICD-10-CM

## 2020-05-25 ENCOUNTER — Other Ambulatory Visit: Payer: Self-pay | Admitting: Family Medicine

## 2020-05-25 DIAGNOSIS — M545 Low back pain, unspecified: Secondary | ICD-10-CM

## 2020-05-31 ENCOUNTER — Encounter: Payer: Self-pay | Admitting: Gastroenterology

## 2020-06-02 ENCOUNTER — Other Ambulatory Visit: Payer: Self-pay

## 2020-06-02 ENCOUNTER — Ambulatory Visit (AMBULATORY_SURGERY_CENTER): Payer: BC Managed Care – PPO | Admitting: Gastroenterology

## 2020-06-02 ENCOUNTER — Encounter: Payer: Self-pay | Admitting: Gastroenterology

## 2020-06-02 VITALS — BP 107/80 | HR 73 | Temp 98.9°F | Resp 14 | Ht 62.0 in | Wt 126.0 lb

## 2020-06-02 DIAGNOSIS — K529 Noninfective gastroenteritis and colitis, unspecified: Secondary | ICD-10-CM

## 2020-06-02 DIAGNOSIS — R1030 Lower abdominal pain, unspecified: Secondary | ICD-10-CM

## 2020-06-02 DIAGNOSIS — R109 Unspecified abdominal pain: Secondary | ICD-10-CM

## 2020-06-02 DIAGNOSIS — K5730 Diverticulosis of large intestine without perforation or abscess without bleeding: Secondary | ICD-10-CM

## 2020-06-02 DIAGNOSIS — K648 Other hemorrhoids: Secondary | ICD-10-CM

## 2020-06-02 MED ORDER — SODIUM CHLORIDE 0.9 % IV SOLN: 500.0000 mL | INTRAVENOUS | Status: DC

## 2020-06-02 NOTE — Progress Notes (Signed)
To pacu, VSS. Report to [email or web address].tb 

## 2020-06-02 NOTE — Progress Notes (Signed)
Pt's states no medical or surgical changes since previsit or office visit.  CW vitals and AB IV. 

## 2020-06-02 NOTE — Progress Notes (Signed)
Called to room to assist during endoscopic procedure.  Patient ID and intended procedure confirmed with present staff. Received instructions for my participation in the procedure from the performing physician.  

## 2020-06-02 NOTE — Patient Instructions (Signed)
Please read handouts provided. Continue present medications. Await pathology results.   YOU HAD AN ENDOSCOPIC PROCEDURE TODAY AT THE [facility]:   Refer to the procedure report that was given to you for any specific questions about what was found during the examination.  If the procedure report does not answer your questions, please call your gastroenterologist to clarify.  If you requested that your care partner not be given the details of your procedure findings, then the procedure report has been included in a sealed envelope for you to review at your convenience later.  YOU SHOULD EXPECT: Some feelings of bloating in the abdomen. Passage of more gas than usual.  Walking can help get rid of the air that was put into your GI tract during the procedure and reduce the bloating. If you had a lower endoscopy (such as a colonoscopy or flexible sigmoidoscopy) you may notice spotting of blood in your stool or on the toilet paper. If you underwent a bowel prep for your procedure, you may not have a normal bowel movement for a few days.  Please Note:  You might notice some irritation and congestion in your nose or some drainage.  This is from the oxygen used during your procedure.  There is no need for concern and it should clear up in a day or so.  SYMPTOMS TO REPORT IMMEDIATELY:  Following lower endoscopy (colonoscopy or flexible sigmoidoscopy):  Excessive amounts of blood in the stool  Significant tenderness or worsening of abdominal pains  Swelling of the abdomen that is new, acute  Fever of 100F or higher   For urgent or emergent issues, a gastroenterologist can be reached at any hour by calling [phone]. Do not use MyChart messaging for urgent concerns.    DIET:  We do recommend a small meal at first, but then you may proceed to your regular diet.  Drink plenty of fluids but you should avoid alcoholic beverages for 24 hours.  ACTIVITY:  You should plan to take it easy  for the rest of today and you should NOT DRIVE or use heavy machinery until tomorrow (because of the sedation medicines used during the test).    FOLLOW UP: Our staff will call the number listed on your records 48-72 hours following your procedure to check on you and address any questions or concerns that you may have regarding the information given to you following your procedure. If we do not reach you, we will leave a message.  We will attempt to reach you two times.  During this call, we will ask if you have developed any symptoms of COVID 19. If you develop any symptoms (ie: fever, flu-like symptoms, shortness of breath, cough etc.) before then, please call [phone].  If you test positive for Covid 19 in the 2 weeks post procedure, please call and report this information to us.    If any biopsies were taken you will be contacted by phone or by letter within the next 1-3 weeks.  Please call us at [phone] if you have not heard about the biopsies in 3 weeks.    SIGNATURES/CONFIDENTIALITY: You and/or your care partner have signed paperwork which will be entered into your electronic medical record.  These signatures attest to the fact that that the information above on your After Visit Summary has been reviewed and is understood.  Full responsibility of the confidentiality of this discharge information lies with you and/or your care-partner.  

## 2020-06-02 NOTE — Op Note (Signed)
[place] Patient Name: [name] Procedure Date: 06/02/2020 3:22 PM MRN: [id number] Endoscopist: Remo Lipps P. Havery Moros , MD Age: 55 Referring MD:  Date of Birth: [birth year] Gender: Female Account #: [id number] Procedure:                Colonoscopy Indications:              Lower abdominal pain, Chronic diarrhea - mildly low                            pancreatic fecal elastase, pancreatic enzyme                            supplementation has provided mild benefit at most,                            on IB / FD gard with some benefit in discomfort, CT                            scan 2020 without pathology Medicines:                Monitored Anesthesia Care Procedure:                Pre-Anesthesia Assessment:                           - Prior to the procedure, a History and Physical                            was performed, and patient medications and                            allergies were reviewed. The patient's tolerance of                            previous anesthesia was also reviewed. The risks                            and benefits of the procedure and the sedation                            options and risks were discussed with the patient.                            All questions were answered, and informed consent                            was obtained. Prior Anticoagulants: The patient has                            taken no previous anticoagulant or antiplatelet                            agents. ASA Grade Assessment: II - A patient with  mild systemic disease. After reviewing the risks                            and benefits, the patient was deemed in                            satisfactory condition to undergo the procedure.                           After obtaining informed consent, the colonoscope                            was passed under direct vision. Throughout the                            procedure, the patient's blood  pressure, pulse, and                            oxygen saturations were monitored continuously. The                            Olympus PCF-H190DL ([id number]) Colonoscope was                            introduced through the anus and advanced to the the                            terminal ileum, with identification of the                            appendiceal orifice and IC valve. The colonoscopy                            was performed without difficulty. The patient                            tolerated the procedure well. The quality of the                            bowel preparation was good. The terminal ileum,                            ileocecal valve, appendiceal orifice, and rectum                            were photographed. Scope In: 3:29:24 PM Scope Out: 3:49:16 PM Scope Withdrawal Time: 0 hours 15 minutes 50 seconds  Total Procedure Duration: 0 hours 19 minutes 52 seconds  Findings:                 The perianal and digital rectal examinations were                            normal.  The terminal ileum appeared normal.                           A few small-mouthed diverticula were found in the                            sigmoid colon.                           Internal hemorrhoids were found during                            retroflexion. The hemorrhoids were small.                           The exam was otherwise without abnormality.                           Biopsies for histology were taken with a cold                            forceps from the right colon, left colon and                            transverse colon for evaluation of microscopic                            colitis. Complications:            No immediate complications. Estimated blood loss:                            Minimal. Estimated Blood Loss:     Estimated blood loss was minimal. Impression:               - The examined portion of the ileum was normal.                            - Diverticulosis in the sigmoid colon.                           - Internal hemorrhoids.                           - The examination was otherwise normal.                           - Biopsies were taken with a cold forceps from the                            right colon, left colon and transverse colon for                            evaluation of microscopic colitis. Recommendation:           - Patient has a contact number available for  emergencies. The signs and symptoms of potential                            delayed complications were discussed with the                            patient. Return to normal activities tomorrow.                            Written discharge instructions were provided to the                            patient.                           - Resume previous diet.                           - Continue present medications.                           - Await pathology results with further                            recommendations                           - Repeat colonoscopy in 10 years for screening                            purposes Remo Lipps P. Bijan Ridgley, MD 06/02/2020 3:54:08 PM This report has been signed electronically.

## 2020-06-04 ENCOUNTER — Telehealth: Payer: Self-pay | Admitting: *Deleted

## 2020-06-04 ENCOUNTER — Telehealth: Payer: Self-pay

## 2020-06-04 ENCOUNTER — Telehealth: Payer: Self-pay | Admitting: Gastroenterology

## 2020-06-04 NOTE — Telephone Encounter (Signed)
Inbound call from patient. Had procedure 5/11 and is feeling fine but wanted to talk about her ongoing problem with urgent loose diarrhea on and off. Asking if she can be seen or advice on the problem. I offered next available which is 6/24. But she insist it needs to be before 6/1. Best contact number [phone]

## 2020-06-04 NOTE — Telephone Encounter (Signed)
She can try taking an extra dose of Zenpep to see if that helps her, but yes I am awaiting the biopsy results and will contact her with my recommendations once that has returned. Thanks

## 2020-06-04 NOTE — Telephone Encounter (Signed)
  Follow up Call-  Call back number 06/02/2020 03/11/2020 10/17/2018  Post procedure Call Back phone  # [phone]  Permission to leave phone message Yes Yes Yes  Some recent data might be hidden     Patient questions:  Do you have a fever, pain , or abdominal swelling? No. Pain Score  0 *  Have you tolerated food without any problems? Yes.    Have you been able to return to your normal activities? Yes.    Do you have any questions about your discharge instructions: Diet   No. Medications  No. Follow up visit  No.  Do you have questions or concerns about your Care? No.  Actions: * If pain score is 4 or above: No action needed, pain <4.  1. Have you developed a fever since your procedure? No  2.   Have you had an respiratory symptoms (SOB or cough) since your procedure? No 3.   Have you tested positive for COVID 19 since your procedure No 4.   Have you had any family members/close contacts diagnosed with the COVID 19 since your procedure?  No  If yes to any of these questions please route to Joylene John, RN and Joella Prince, RN

## 2020-06-04 NOTE — Telephone Encounter (Signed)
  Follow up Call-  Call back number 06/02/2020 03/11/2020 10/17/2018  Post procedure Call Back phone  # [phone]  Permission to leave phone message Yes Yes Yes  Some recent data might be hidden     Patient questions:  Message left to call if necessary.

## 2020-06-04 NOTE — Telephone Encounter (Signed)
Spoke with patient, she is calling in with further concerns in regards to her stools. She stated that she relayed this information to the nurse that called her earlier to follow up also. She states that she has been on Zenpep 2 capsules with each meal and snack for a while but still has some loose stools. Patient states that her stools are "cloudy, oily, and slick." She describes her stools as formed but loose and "pinky" sized. Patient states that she sometimes has RLQ pain and urgency to have a BM. She is not taking any fiber supplements but does take 2 probiotic gummies daily. Advised patient that Dr. Havery Moros did take biopsies and may wait for that to come back before he advises but will get this information to him for his review. Please advise, thanks.

## 2020-06-07 MED ORDER — ZENPEP 40000-126000 UNITS PO CPEP: ORAL_CAPSULE | ORAL | 0 refills | Status: DC

## 2020-06-07 NOTE — Telephone Encounter (Signed)
Spoke with patient in regards to recommendations.   Dr. Havery Moros, please clarify if you would like patient to take 3 capsules with each meal and 3 capsules with each snack? Patient is requesting a 90 day supply. Please advise, thanks.

## 2020-06-07 NOTE — Telephone Encounter (Signed)
Updated prescription sent to pharmacy on file.

## 2020-06-07 NOTE — Addendum Note (Signed)
Addended by: Yevette Edwards on: 06/07/2020 01:04 PM   Modules accepted: Orders

## 2020-06-07 NOTE — Telephone Encounter (Signed)
Lm on vm for patient to return call 

## 2020-06-07 NOTE — Telephone Encounter (Signed)
3 capsules with each meal and 2 with snacks. thanks

## 2020-06-16 ENCOUNTER — Other Ambulatory Visit: Payer: Self-pay | Admitting: Family Medicine

## 2020-06-16 DIAGNOSIS — E559 Vitamin D deficiency, unspecified: Secondary | ICD-10-CM

## 2020-06-23 ENCOUNTER — Telehealth: Payer: Self-pay

## 2020-06-23 NOTE — Telephone Encounter (Signed)
Spoke with patient, she states that her symptoms are about the same. She states that the scheduled Imodium in the AM does help some but she may have constipation the following day. Pt states that it is a "hit or miss." Patient is still taking Zenpep, 3 capsules with meals and 2 capsules with each snack. Patient states that she has also changed her diet and is not sure if that is helping at all. Please advise, thanks.

## 2020-06-23 NOTE — Telephone Encounter (Signed)
Spoke with patient in regards to recommendations as outlined below. Advised patient that I will send her a low FODMAP diet to her my chart for her review. Patient verbalized understanding and had no concerns at the end of the call.

## 2020-06-23 NOTE — Telephone Encounter (Signed)
-----   Message from Yevette Edwards, RN sent at 06/16/2020 10:26 AM EDT ----- Regarding: Update Symptom update, see 06/02/20 pathology result note.

## 2020-06-23 NOTE — Telephone Encounter (Signed)
Okay thanks for the follow-up.  Sounds like she does respond to Imodium and if that causes constipation, it works.  She should continue with her Zenpep, perhaps try half Imodium in the a.m. and see if that works without causing constipation.  She can also try a low FODMAP diet if she has not tried that yet.  Thanks

## 2020-07-12 ENCOUNTER — Other Ambulatory Visit: Payer: Self-pay | Admitting: Family Medicine

## 2020-07-12 DIAGNOSIS — M79605 Pain in left leg: Secondary | ICD-10-CM

## 2020-07-12 DIAGNOSIS — E559 Vitamin D deficiency, unspecified: Secondary | ICD-10-CM

## 2020-07-12 DIAGNOSIS — M79604 Pain in right leg: Secondary | ICD-10-CM

## 2020-08-16 ENCOUNTER — Telehealth: Payer: Self-pay | Admitting: Family Medicine

## 2020-08-16 NOTE — Telephone Encounter (Signed)
Pt would like a call back states she has questions regarding her medications. Please call

## 2020-08-16 NOTE — Telephone Encounter (Signed)
Pt called and asked if you would manage she and her husband ([id number]) Dextroamphetamine 10 mg.   She takes is 20 mg three times a day, he takes it 20 mg- 50 mg daily. Currently Dr Robina Ade Wylene Simmer) is managing this med.

## 2020-08-18 NOTE — Telephone Encounter (Signed)
Pt made aware of below conditions. She will call back if she decides to switch

## 2020-08-31 ENCOUNTER — Encounter: Payer: BC Managed Care – PPO | Admitting: Family Medicine

## 2020-09-02 ENCOUNTER — Other Ambulatory Visit: Payer: Self-pay

## 2020-09-03 ENCOUNTER — Encounter: Payer: BC Managed Care – PPO | Admitting: Family Medicine

## 2020-09-03 NOTE — Progress Notes (Incomplete)
Subjective:   By signing my name below, I, [name], attest that this documentation has been prepared under the direction and in the presence of [name], DO. 09/03/2020    Patient ID: [name], female    DOB: 1965/12/24, 55 y.o.   MRN: [id number]  No chief complaint on file.   HPI Patient is in today for a comprehensive physical exam.  She denies fever, hearing loss, ear pain,congestion, sinus pain, sore throat, eye pain, chest pain, palpitations, cough, shortness of breath, wheezing, nausea. vomiting, diarrhea, constipation, blood in stool, dysuria,frequency, hematuria and headaches.  Past Medical History:  Diagnosis Date   Allergy    Anemia    Anxiety    Asthma    Depression    GERD (gastroesophageal reflux disease)    Heart murmur    from childhood   Hiatal hernia    Hypertension    Peptic ulcer    UTI (urinary tract infection)     Past Surgical History:  Procedure Laterality Date   ABDOMINAL HYSTERECTOMY     fibroids   BIOPSY  07/11/2018   Procedure: BIOPSY;  Surgeon: [name], MD;  Location: [facility] ENDOSCOPY;  Service: Endoscopy;;   COLONOSCOPY  2014   ESOPHAGOGASTRODUODENOSCOPY (EGD) WITH PROPOFOL N/A 07/11/2018   Procedure: ESOPHAGOGASTRODUODENOSCOPY (EGD) WITH PROPOFOL;  Surgeon: [name], MD;  Location: [facility] ENDOSCOPY;  Service: Endoscopy;  Laterality: N/A;   GASTRIC BYPASS  2007   HEMOSTASIS CLIP PLACEMENT  07/11/2018   Procedure: HEMOSTASIS CLIP PLACEMENT;  Surgeon: [name], MD;  Location: [place];  Service: Endoscopy;;   LAPAROTOMY N/A 03/08/2018   Procedure: EXPLORATORY LAPAROTOMY, REPAIR AND PATCH CLOSURE OF PERFORATED ULCER;  Surgeon: Georganna Skeans, MD;  Location: [place];  Service: General;  Laterality: N/A;   TUBAL LIGATION      Family History  Problem Relation Age of Onset   Hypertension Father    Cancer Father        Prostate   Depression Sister    Depression Brother    Diabetes Maternal Grandmother     Alzheimer's disease Maternal Grandmother    Diabetes Maternal Grandfather    Liver disease Maternal Grandfather    Cancer Maternal Grandfather        Prostate   Diabetes Paternal Grandmother    Alzheimer's disease Paternal Grandmother    Diabetes Paternal Grandfather    Cancer Paternal Grandfather        Lung, Prostate   Stroke Mother    Alzheimer's disease Mother    Colon cancer Neg Hx    Esophageal cancer Neg Hx    Pancreatic cancer Neg Hx    Stomach cancer Neg Hx    Colon polyps Neg Hx    Rectal cancer Neg Hx     Social History   Socioeconomic History   Marital status: Married    Spouse name: Not on file   Number of children: Not on file   Years of education: Not on file   Highest education level: Not on file  Occupational History    Employer: [employer]    Comment: [employer]  Tobacco Use   Smoking status: Former    Types: Cigarettes    Quit date: 01/24/1987    Years since quitting: 33.6   Smokeless tobacco: Never  Vaping Use   Vaping Use: Former   Substances: Flavoring   Devices: has vaped with flavor but no nicotine. Been 8 years  Substance and Sexual Activity  Alcohol use: Not Currently    Alcohol/week: 35.0 standard drinks    Types: 35 Cans of beer per week   Drug use: No   Sexual activity: Not Currently  Other Topics Concern   Not on file  Social History Narrative   Regular exercise--no (because of knees)   Social Determinants of Health   Financial Resource Strain: Not on file  Food Insecurity: Not on file  Transportation Needs: Not on file  Physical Activity: Not on file  Stress: Not on file  Social Connections: Not on file  Intimate Partner Violence: Not on file    Outpatient Medications Prior to Visit  Medication Sig Dispense Refill   Acetaminophen (TYLENOL 8 HOUR ARTHRITIS PAIN PO) Take 2 tablets by mouth as needed.     Acetaminophen (TYLENOL PO) Take 1 tablet by mouth as needed (Rapid release tablet).     busPIRone (BUSPAR) 30 MG  tablet Take 1 tablet by mouth in the morning and at bedtime.     Caraway Oil-Levomenthol (FDGARD) 25-20.75 MG CAPS Take as directed 16 capsule 0   Cholecalciferol (VITAMIN D3) 50 MCG (2000 UT) capsule Take 1 capsule by mouth once daily 90 capsule 0   cyanocobalamin (,VITAMIN B-12,) 1000 MCG/ML injection Inject 1 mL (1,000 mcg total) into the muscle every 30 (thirty) days. 10 mL 0   cyclobenzaprine (FLEXERIL) 10 MG tablet Take 1 tablet by mouth three times daily as needed for muscle spasm 30 tablet 2   dextroamphetamine (DEXTROSTAT) 10 MG tablet Take 20 mg by mouth 3 (three) times daily.      diazepam (VALIUM) 5 MG tablet Take 1 tablet by mouth as needed.     Diclofenac Sodium 2 % SOLN Apply 1 pump twice daily as needed. (Patient not taking: No sig reported) 112 g 1   estradiol (VIVELLE-DOT) 0.05 MG/24HR patch 1 patch 2 (two) times a week.     fluticasone (FLONASE) 50 MCG/ACT nasal spray Place 2 sprays into both nostrils daily. 16 g 5   furosemide (LASIX) 40 MG tablet Take 1 tablet by mouth once daily 90 tablet 0   Homeopathic Products (ALLERGY MEDICINE PO) Take 1 tablet by mouth as needed (Takes the Clortabs. Equate Brand).     HYDROcodone-acetaminophen (NORCO/VICODIN) 5-325 MG tablet Take 1 tablet by mouth every 6 (six) hours as needed. (Patient not taking: No sig reported) 15 tablet 0   loperamide (IMODIUM) 2 MG capsule Take 2 mg by mouth daily as needed.      omeprazole (PRILOSEC) 40 MG capsule TAKE 1 CAPSULE BY MOUTH TWICE DAILY. OPEN CAPSULE AND SPRINKLE ON APPLESAUCE. 180 capsule 1   Pancrelipase, Lip-Prot-Amyl, (ZENPEP) 40000-126000 units CPEP Take 3 capsules (120,000 units) by mouth WITH each meal, and 2 capsules (80,000 units) WITH each snack. 1170 capsule 0   predniSONE (DELTASONE) 10 MG tablet TAKE 3 TABLETS PO QD FOR 3 DAYS THEN TAKE 2 TABLETS PO QD FOR 3 DAYS THEN TAKE 1 TABLET PO QD FOR 3 DAYS THEN TAKE 1/2 TAB PO QD FOR 3 DAYS (Patient not taking: No sig reported) 20 tablet 0    Probiotic Product (ALIGN) CHEW Chew 2 tablets by mouth daily. Align pre and probiotic with vitamain C-take one tablet each Align pre and probiotic with Vitamin b12 for immune and energy-take one tablet each     rOPINIRole (REQUIP) 1 MG tablet TAKE 1 TABLET (1 MG TOTAL) BY MOUTH AT BEDTIME. 90 tablet 0   sertraline (ZOLOFT) 100 MG tablet Take 200 mg by  mouth daily.     SYRINGE-NEEDLE, DISP, 3 ML (B-D 3CC LUER-LOK SYR 25GX5/8") 25G X 5/8" 3 ML MISC USE AS DIRECTED WITH B-12 INJECTION 16 each 3   VITAMIN A PO Take by mouth.     No facility-administered medications prior to visit.    Allergies  Allergen Reactions   Penicillins Other (See Comments)    Did it involve swelling of the face/tongue/throat, SOB, or low BP? Unk Did it involve sudden or severe rash/hives, skin peeling, or any reaction on the inside of your mouth or nose? Unk Did you need to seek medical attention at a hospital or doctor's office? Unk When did it last happen? Was 55 years old; reaction not recalled     If all above answers are "NO", may proceed with cephalosporin use.    Codeine Itching    Crazy dreams and itching-pt said she can take it with food    Review of Systems  Constitutional:  Negative for fever.  HENT:  Negative for congestion, ear pain, hearing loss, sinus pain and sore throat.   Eyes:  Negative for pain.  Respiratory:  Negative for cough, shortness of breath and wheezing.   Cardiovascular:  Negative for chest pain and palpitations.  Gastrointestinal:  Negative for blood in stool, constipation, diarrhea, nausea and vomiting.  Genitourinary:  Negative for dysuria, frequency and hematuria.  Neurological:  Negative for headaches.      Objective:    Physical Exam Constitutional:      General: She is not in acute distress.    Appearance: Normal appearance. She is not ill-appearing.  HENT:     Head: Normocephalic and atraumatic.     Right Ear: External ear normal.     Left Ear: External ear normal.   Eyes:     Extraocular Movements: Extraocular movements intact.     Pupils: Pupils are equal, round, and reactive to light.  Cardiovascular:     Rate and Rhythm: Normal rate and regular rhythm.     Pulses: Normal pulses.     Heart sounds: Normal heart sounds. No murmur heard.   No gallop.  Pulmonary:     Effort: Pulmonary effort is normal. No respiratory distress.     Breath sounds: Normal breath sounds. No wheezing, rhonchi or rales.  Abdominal:     General: Bowel sounds are normal. There is no distension.     Palpations: Abdomen is soft. There is no mass.     Tenderness: There is no abdominal tenderness. There is no guarding or rebound.     Hernia: No hernia is present.  Musculoskeletal:     Cervical back: Normal range of motion and neck supple.  Lymphadenopathy:     Cervical: No cervical adenopathy.  Skin:    General: Skin is warm and dry.  Neurological:     Mental Status: She is alert and oriented to person, place, and time.  Psychiatric:        Behavior: Behavior normal.    There were no vitals taken for this visit. Wt Readings from Last 3 Encounters:  06/02/20 126 lb (57.2 kg)  03/11/20 124 lb (56.2 kg)  03/09/20 124 lb (56.2 kg)    Diabetic Foot Exam - Simple   No data filed    Lab Results  Component Value Date   WBC 6.9 01/27/2020   HGB 12.7 01/27/2020   HCT 37.5 01/27/2020   PLT 218.0 01/27/2020   GLUCOSE 98 01/27/2020   CHOL 159 08/29/2019   TRIG  71.0 08/29/2019   HDL 93.20 08/29/2019   LDLCALC 51 08/29/2019   ALT 21 01/27/2020   AST 32 01/27/2020   NA 138 01/27/2020   K 4.3 01/27/2020   CL 103 01/27/2020   CREATININE 0.75 01/27/2020   BUN 17 01/27/2020   CO2 29 01/27/2020   TSH 1.91 04/07/2019    Lab Results  Component Value Date   TSH 1.91 04/07/2019   Lab Results  Component Value Date   WBC 6.9 01/27/2020   HGB 12.7 01/27/2020   HCT 37.5 01/27/2020   MCV 94.3 01/27/2020   PLT 218.0 01/27/2020   Lab Results  Component Value Date    NA 138 01/27/2020   K 4.3 01/27/2020   CO2 29 01/27/2020   GLUCOSE 98 01/27/2020   BUN 17 01/27/2020   CREATININE 0.75 01/27/2020   BILITOT 0.3 01/27/2020   ALKPHOS 64 01/27/2020   AST 32 01/27/2020   ALT 21 01/27/2020   PROT 7.3 01/27/2020   ALBUMIN 4.7 01/27/2020   CALCIUM 9.4 01/27/2020   ANIONGAP 6 07/12/2018   GFR 90.02 01/27/2020   Lab Results  Component Value Date   CHOL 159 08/29/2019   Lab Results  Component Value Date   HDL 93.20 08/29/2019   Lab Results  Component Value Date   LDLCALC 51 08/29/2019   Lab Results  Component Value Date   TRIG 71.0 08/29/2019   Lab Results  Component Value Date   CHOLHDL 2 08/29/2019   No results found for: HGBA1C      Mammogram: Last completed on 05/16/2019. Results were normal. Repeat in 1 year. Colonoscopy: Last completed on 06/02/2020. Results showed diverticulosis in the sigmoid colon and internal hemorrhoids. Biopsies were taken for evaluation of microscopic colitis. Otherwise exam was normal. Repeat in 10 years.  Assessment & Plan:   Problem List Items Addressed This Visit   None   No orders of the defined types were placed in this encounter.   I,[name],acting as a Education administrator for [employer], DO.,have documented all relevant documentation on the behalf of [name], DO,as directed by  [name], DO while in the presence of [name], [place], DO., personally preformed the services described in this documentation.  All medical record entries made by the scribe were at my direction and in my presence.  I have reviewed the chart and discharge instructions (if applicable) and agree that the record reflects my personal performance and is accurate and complete. 09/03/2020

## 2020-09-22 ENCOUNTER — Other Ambulatory Visit: Payer: Self-pay

## 2020-09-23 ENCOUNTER — Ambulatory Visit (INDEPENDENT_AMBULATORY_CARE_PROVIDER_SITE_OTHER): Payer: BC Managed Care – PPO | Admitting: Family Medicine

## 2020-09-23 ENCOUNTER — Encounter: Payer: Self-pay | Admitting: Family Medicine

## 2020-09-23 VITALS — BP 98/70 | HR 69 | Temp 97.9°F | Resp 18 | Ht 62.0 in | Wt 134.4 lb

## 2020-09-23 DIAGNOSIS — R41840 Attention and concentration deficit: Secondary | ICD-10-CM | POA: Diagnosis not present

## 2020-09-23 DIAGNOSIS — R42 Dizziness and giddiness: Secondary | ICD-10-CM | POA: Diagnosis not present

## 2020-09-23 DIAGNOSIS — Z0001 Encounter for general adult medical examination with abnormal findings: Secondary | ICD-10-CM | POA: Diagnosis not present

## 2020-09-23 DIAGNOSIS — Z0000 Encounter for general adult medical examination without abnormal findings: Secondary | ICD-10-CM

## 2020-09-23 DIAGNOSIS — F419 Anxiety disorder, unspecified: Secondary | ICD-10-CM

## 2020-09-23 DIAGNOSIS — Z23 Encounter for immunization: Secondary | ICD-10-CM

## 2020-09-23 LAB — CBC WITH DIFFERENTIAL/PLATELET
Basophils Absolute: 0 10*3/uL (ref 0.0–0.1)
Basophils Relative: 1.2 % (ref 0.0–3.0)
Eosinophils Absolute: 0.3 10*3/uL (ref 0.0–0.7)
Eosinophils Relative: 6.9 % — ABNORMAL HIGH (ref 0.0–5.0)
HCT: 39.4 % (ref 36.0–46.0)
Hemoglobin: 13.2 g/dL (ref 12.0–15.0)
Lymphocytes Relative: 36.6 % (ref 12.0–46.0)
Lymphs Abs: 1.4 10*3/uL (ref 0.7–4.0)
MCHC: 33.5 g/dL (ref 30.0–36.0)
MCV: 90.8 fl (ref 78.0–100.0)
Monocytes Absolute: 0.3 10*3/uL (ref 0.1–1.0)
Monocytes Relative: 8.4 % (ref 3.0–12.0)
Neutro Abs: 1.8 10*3/uL (ref 1.4–7.7)
Neutrophils Relative %: 46.9 % (ref 43.0–77.0)
Platelets: 210 10*3/uL (ref 150.0–400.0)
RBC: 4.34 Mil/uL (ref 3.87–5.11)
RDW: 13.9 % (ref 11.5–15.5)
WBC: 3.9 10*3/uL — ABNORMAL LOW (ref 4.0–10.5)

## 2020-09-23 LAB — COMPREHENSIVE METABOLIC PANEL
ALT: 18 U/L (ref 0–35)
AST: 25 U/L (ref 0–37)
Albumin: 4.1 g/dL (ref 3.5–5.2)
Alkaline Phosphatase: 74 U/L (ref 39–117)
BUN: 13 mg/dL (ref 6–23)
CO2: 34 mEq/L — ABNORMAL HIGH (ref 19–32)
Calcium: 9.3 mg/dL (ref 8.4–10.5)
Chloride: 103 mEq/L (ref 96–112)
Creatinine, Ser: 0.84 mg/dL (ref 0.40–1.20)
GFR: 78.21 mL/min (ref >60.00)
Glucose, Bld: 88 mg/dL (ref 70–99)
Potassium: 4.4 mEq/L (ref 3.5–5.1)
Sodium: 141 mEq/L (ref 135–145)
Total Bilirubin: 0.4 mg/dL (ref 0.2–1.2)
Total Protein: 6.6 g/dL (ref 6.0–8.3)

## 2020-09-23 LAB — LIPID PANEL
Cholesterol: 191 mg/dL (ref 0–200)
HDL: 66.8 mg/dL (ref >39.00)
LDL Cholesterol: 112 mg/dL — ABNORMAL HIGH (ref 0–99)
NonHDL: 124.64
Total CHOL/HDL Ratio: 3
Triglycerides: 61 mg/dL (ref 0.0–149.0)
VLDL: 12.2 mg/dL (ref 0.0–40.0)

## 2020-09-23 LAB — TSH: TSH: 4.71 u[IU]/mL (ref 0.35–5.50)

## 2020-09-23 MED ORDER — BUSPIRONE HCL 30 MG PO TABS: 30.0000 mg | ORAL_TABLET | Freq: Two times a day (BID) | ORAL | 1 refills | Status: DC

## 2020-09-23 MED ORDER — DEXTROAMPHETAMINE SULFATE 10 MG PO TABS: 20.0000 mg | ORAL_TABLET | Freq: Three times a day (TID) | ORAL | 0 refills | Status: DC

## 2020-09-23 NOTE — Progress Notes (Signed)
Subjective:   By signing my name below, I, [name], attest that this documentation has been prepared under the direction and in the presence of Dr. Roma Schanz, DO. 09/23/2020    Patient ID: [name], female    DOB: 21-Nov-1965, 55 y.o.   MRN: [id number]  Chief Complaint  Patient presents with   Annual Exam    Pt states fasting     HPI Patient is in today for a comprehensive physical exam.  She is requesting a refill for 30 mg Buspar 2x daily PO and 10 mg dextroamphetamine 3x daily PO.  She continues having back pain. She is seeing her orthopedist surgeon to manage her symptoms. She also notes getting dizzy while standing from sitting and looking up. Her symptoms improved when she looking straight ahead.  She denies having any fever, ear pain, congestion, sinus pain, sore throat, eye pain, chest pain, palpations, cough, SOB, wheezing, n/v/d, constipation, blood in stool, dysuria, frequency, hematuria, or headaches at this time. She has 3 pfizer Covid-19 vaccines at this time and is willing to get the 2nd booster vaccine at a later date. She is interested in getting a flu vaccine during this visit.    Past Medical History:  Diagnosis Date   Allergy    Anemia    Anxiety    Asthma    Depression    GERD (gastroesophageal reflux disease)    Heart murmur    from childhood   Hiatal hernia    Hypertension    Peptic ulcer    UTI (urinary tract infection)     Past Surgical History:  Procedure Laterality Date   ABDOMINAL HYSTERECTOMY     fibroids   BIOPSY  07/11/2018   Procedure: BIOPSY;  Surgeon: [name], MD;  Location: [facility] ENDOSCOPY;  Service: Endoscopy;;   COLONOSCOPY  2014   ESOPHAGOGASTRODUODENOSCOPY (EGD) WITH PROPOFOL N/A 07/11/2018   Procedure: ESOPHAGOGASTRODUODENOSCOPY (EGD) WITH PROPOFOL;  Surgeon: [name], MD;  Location: [facility] ENDOSCOPY;  Service: Endoscopy;  Laterality: N/A;   GASTRIC BYPASS  2007   HEMOSTASIS CLIP PLACEMENT  07/11/2018    Procedure: HEMOSTASIS CLIP PLACEMENT;  Surgeon: [name], MD;  Location: [place];  Service: Endoscopy;;   LAPAROTOMY N/A 03/08/2018   Procedure: EXPLORATORY LAPAROTOMY, REPAIR AND PATCH CLOSURE OF PERFORATED ULCER;  Surgeon: Georganna Skeans, MD;  Location: [place];  Service: General;  Laterality: N/A;   TUBAL LIGATION      Family History  Problem Relation Age of Onset   Hypertension Father    Cancer Father        Prostate   Depression Sister    Depression Brother    Diabetes Maternal Grandmother    Alzheimer's disease Maternal Grandmother    Diabetes Maternal Grandfather    Liver disease Maternal Grandfather    Cancer Maternal Grandfather        Prostate   Diabetes Paternal Grandmother    Alzheimer's disease Paternal Grandmother    Diabetes Paternal Grandfather    Cancer Paternal Grandfather        Lung, Prostate   Stroke Mother    Alzheimer's disease Mother    Colon cancer Neg Hx    Esophageal cancer Neg Hx    Pancreatic cancer Neg Hx    Stomach cancer Neg Hx    Colon polyps Neg Hx    Rectal cancer Neg Hx     Social History   Socioeconomic History   Marital status: Married    Spouse  name: Not on file   Number of children: Not on file   Years of education: Not on file   Highest education level: Not on file  Occupational History    Employer: [employer]    Comment: [employer]  Tobacco Use   Smoking status: Former    Types: Cigarettes    Quit date: 01/24/1987    Years since quitting: 33.6   Smokeless tobacco: Never  Vaping Use   Vaping Use: Former   Substances: Flavoring   Devices: has vaped with flavor but no nicotine. Been 8 years  Substance and Sexual Activity   Alcohol use: Not Currently    Alcohol/week: 35.0 standard drinks    Types: 35 Cans of beer per week   Drug use: No   Sexual activity: Not Currently  Other Topics Concern   Not on file  Social History Narrative   Regular exercise--no (because of knees)   Social Determinants of  Health   Financial Resource Strain: Not on file  Food Insecurity: Not on file  Transportation Needs: Not on file  Physical Activity: Not on file  Stress: Not on file  Social Connections: Not on file  Intimate Partner Violence: Not on file    Outpatient Medications Prior to Visit  Medication Sig Dispense Refill   Acetaminophen (TYLENOL 8 HOUR ARTHRITIS PAIN PO) Take 2 tablets by mouth as needed.     Acetaminophen (TYLENOL PO) Take 1 tablet by mouth as needed (Rapid release tablet).     Cholecalciferol (VITAMIN D3) 50 MCG (2000 UT) capsule Take 1 capsule by mouth once daily 90 capsule 0   cyanocobalamin (,VITAMIN B-12,) 1000 MCG/ML injection Inject 1 mL (1,000 mcg total) into the muscle every 30 (thirty) days. 10 mL 0   cyclobenzaprine (FLEXERIL) 10 MG tablet Take 1 tablet by mouth three times daily as needed for muscle spasm 30 tablet 2   diazepam (VALIUM) 5 MG tablet Take 1 tablet by mouth as needed.     Diclofenac Sodium 2 % SOLN Apply 1 pump twice daily as needed. 112 g 1   estradiol (VIVELLE-DOT) 0.05 MG/24HR patch 1 patch 2 (two) times a week.     fluticasone (FLONASE) 50 MCG/ACT nasal spray Place 2 sprays into both nostrils daily. 16 g 5   furosemide (LASIX) 40 MG tablet Take 1 tablet by mouth once daily 90 tablet 0   Homeopathic Products (ALLERGY MEDICINE PO) Take 1 tablet by mouth as needed (Takes the Clortabs. Equate Brand).     HYDROcodone-acetaminophen (NORCO/VICODIN) 5-325 MG tablet Take 1 tablet by mouth every 6 (six) hours as needed. 15 tablet 0   loperamide (IMODIUM) 2 MG capsule Take 2 mg by mouth daily as needed.      omeprazole (PRILOSEC) 40 MG capsule TAKE 1 CAPSULE BY MOUTH TWICE DAILY. OPEN CAPSULE AND SPRINKLE ON APPLESAUCE. 180 capsule 1   Pancrelipase, Lip-Prot-Amyl, (ZENPEP) 40000-126000 units CPEP Take 3 capsules (120,000 units) by mouth WITH each meal, and 2 capsules (80,000 units) WITH each snack. 1170 capsule 0   Probiotic Product (ALIGN) CHEW Chew 2 tablets by  mouth daily. Align pre and probiotic with vitamain C-take one tablet each Align pre and probiotic with Vitamin b12 for immune and energy-take one tablet each     rOPINIRole (REQUIP) 1 MG tablet TAKE 1 TABLET (1 MG TOTAL) BY MOUTH AT BEDTIME. 90 tablet 0   sertraline (ZOLOFT) 100 MG tablet Take 200 mg by mouth daily.     SYRINGE-NEEDLE, DISP, 3 ML (B-D  3CC LUER-LOK SYR 25GX5/8") 25G X 5/8" 3 ML MISC USE AS DIRECTED WITH B-12 INJECTION 16 each 3   VITAMIN A PO Take by mouth.     busPIRone (BUSPAR) 30 MG tablet Take 1 tablet by mouth in the morning and at bedtime.     dextroamphetamine (DEXTROSTAT) 10 MG tablet Take 20 mg by mouth 3 (three) times daily.      Caraway Oil-Levomenthol (FDGARD) 25-20.75 MG CAPS Take as directed 16 capsule 0   predniSONE (DELTASONE) 10 MG tablet TAKE 3 TABLETS PO QD FOR 3 DAYS THEN TAKE 2 TABLETS PO QD FOR 3 DAYS THEN TAKE 1 TABLET PO QD FOR 3 DAYS THEN TAKE 1/2 TAB PO QD FOR 3 DAYS (Patient not taking: No sig reported) 20 tablet 0   No facility-administered medications prior to visit.    Allergies  Allergen Reactions   Penicillins Other (See Comments)    Did it involve swelling of the face/tongue/throat, SOB, or low BP? Unk Did it involve sudden or severe rash/hives, skin peeling, or any reaction on the inside of your mouth or nose? Unk Did you need to seek medical attention at a hospital or doctor's office? Unk When did it last happen? Was 55 years old; reaction not recalled     If all above answers are "NO", may proceed with cephalosporin use.    Codeine Itching    Crazy dreams and itching-pt said she can take it with food    Review of Systems  Constitutional:  Negative for fever.  HENT:  Negative for congestion, ear pain, sinus pain and sore throat.   Eyes:  Negative for pain.  Respiratory:  Negative for cough, shortness of breath and wheezing.   Cardiovascular:  Negative for chest pain and palpitations.  Gastrointestinal:  Negative for blood in stool,  constipation, diarrhea, nausea and vomiting.  Genitourinary:  Negative for dysuria, frequency and hematuria.  Neurological:  Positive for dizziness (after standing and looking up). Negative for headaches.  Psychiatric/Behavioral:  Negative for depression. The patient is not nervous/anxious.       Objective:    Physical Exam Vitals and nursing note reviewed.  Constitutional:      General: She is not in acute distress.    Appearance: Normal appearance. She is not ill-appearing.  HENT:     Head: Normocephalic and atraumatic.     Right Ear: Tympanic membrane, ear canal and external ear normal.     Left Ear: Tympanic membrane, ear canal and external ear normal.  Eyes:     Extraocular Movements: Extraocular movements intact.     Pupils: Pupils are equal, round, and reactive to light.  Cardiovascular:     Rate and Rhythm: Normal rate and regular rhythm.     Heart sounds: Normal heart sounds. No murmur heard.   No gallop.  Pulmonary:     Effort: Pulmonary effort is normal. No respiratory distress.     Breath sounds: Normal breath sounds. No wheezing or rales.  Abdominal:     General: There is no distension.     Palpations: Abdomen is soft.     Tenderness: There is no abdominal tenderness. There is no guarding.  Skin:    General: Skin is warm and dry.  Neurological:     Mental Status: She is alert and oriented to person, place, and time.     Gait: Gait normal.  Psychiatric:        Behavior: Behavior normal.        Judgment:  Judgment normal.    BP 98/70 (BP Location: Right Arm, Patient Position: Sitting, Cuff Size: Normal)   Pulse 69   Temp 97.9 F (36.6 C) (Oral)   Resp 18   Ht '5\' 2"'$  (1.575 m)   Wt 134 lb 6.4 oz (61 kg)   SpO2 97%   BMI 24.58 kg/m  Wt Readings from Last 3 Encounters:  09/23/20 134 lb 6.4 oz (61 kg)  06/02/20 126 lb (57.2 kg)  03/11/20 124 lb (56.2 kg)    Diabetic Foot Exam - Simple   No data filed    Lab Results  Component Value Date   WBC 3.9  (L) 09/23/2020   HGB 13.2 09/23/2020   HCT 39.4 09/23/2020   PLT 210.0 09/23/2020   GLUCOSE 88 09/23/2020   CHOL 191 09/23/2020   TRIG 61.0 09/23/2020   HDL 66.80 09/23/2020   LDLCALC 112 (H) 09/23/2020   ALT 18 09/23/2020   AST 25 09/23/2020   NA 141 09/23/2020   K 4.4 09/23/2020   CL 103 09/23/2020   CREATININE 0.84 09/23/2020   BUN 13 09/23/2020   CO2 34 (H) 09/23/2020   TSH 4.71 09/23/2020    Lab Results  Component Value Date   TSH 4.71 09/23/2020   Lab Results  Component Value Date   WBC 3.9 (L) 09/23/2020   HGB 13.2 09/23/2020   HCT 39.4 09/23/2020   MCV 90.8 09/23/2020   PLT 210.0 09/23/2020   Lab Results  Component Value Date   NA 141 09/23/2020   K 4.4 09/23/2020   CO2 34 (H) 09/23/2020   GLUCOSE 88 09/23/2020   BUN 13 09/23/2020   CREATININE 0.84 09/23/2020   BILITOT 0.4 09/23/2020   ALKPHOS 74 09/23/2020   AST 25 09/23/2020   ALT 18 09/23/2020   PROT 6.6 09/23/2020   ALBUMIN 4.1 09/23/2020   CALCIUM 9.3 09/23/2020   ANIONGAP 6 07/12/2018   GFR 78.21 09/23/2020   Lab Results  Component Value Date   CHOL 191 09/23/2020   Lab Results  Component Value Date   HDL 66.80 09/23/2020   Lab Results  Component Value Date   LDLCALC 112 (H) 09/23/2020   Lab Results  Component Value Date   TRIG 61.0 09/23/2020   Lab Results  Component Value Date   CHOLHDL 3 09/23/2020   No results found for: HGBA1C  Mammogram- Last competed 05/16/2019. Results are normal. Repeat in 1 year. Due. She is planning to scheduling an appointment.  Colonoscopy- Last completed 06/02/2020. Results showed diverticulosis in the sigmoid colon, internal hemorrhoids, otherwise results are normal.      Assessment & Plan:   Problem List Items Addressed This Visit       Unprioritized   Anxiety    Refill buspar Symptoms stable      Relevant Medications   busPIRone (BUSPAR) 30 MG tablet   Attention deficit    Stable Refill meds Uds/ contract updated        Relevant Medications   busPIRone (BUSPAR) 30 MG tablet   dextroamphetamine (DEXTROSTAT) 10 MG tablet   Preventative health care - Primary    ghm utd Check labs  See avs      Relevant Orders   CBC with Differential/Platelet (Completed)   TSH (Completed)   Lipid panel (Completed)   Comprehensive metabolic panel (Completed)   Other Visit Diagnoses     Dizziness       Relevant Orders   MR Brain Wo Contrast   MR Angiogram  Head Wo Contrast   Need for influenza vaccination       Relevant Orders   Flu Vaccine QUAD 89moIM (Fluarix, Fluzone & Alfiuria Quad PF) (Completed)        Meds ordered this encounter  Medications   busPIRone (BUSPAR) 30 MG tablet    Sig: Take 1 tablet (30 mg total) by mouth in the morning and at bedtime.    Dispense:  180 tablet    Refill:  1   dextroamphetamine (DEXTROSTAT) 10 MG tablet    Sig: Take 2 tablets (20 mg total) by mouth 3 (three) times daily.    Dispense:  180 tablet    Refill:  0    I, Dr. LRoma Schanz DO, personally preformed the services described in this documentation.  All medical record entries made by the scribe were at my direction and in my presence.  I have reviewed the chart and discharge instructions (if applicable) and agree that the record reflects my personal performance and is accurate and complete. 09/23/2020   I,[name],acting as a scribe for YAnn Held DO.,have documented all relevant documentation on the behalf of YAnn Held DO,as directed by  YAnn Held DO while in the presence of YAnn Held DO.   YAnn Held DO

## 2020-09-23 NOTE — Assessment & Plan Note (Signed)
Refill buspar Symptoms stable

## 2020-09-23 NOTE — Patient Instructions (Signed)
Preventive Care 40-55 Years Old, Female Preventive care refers to lifestyle choices and visits with your health care provider that can promote health and wellness. This includes: A yearly physical exam. This is also called an annual wellness visit. Regular dental and eye exams. Immunizations. Screening for certain conditions. Healthy lifestyle choices, such as: Eating a healthy diet. Getting regular exercise. Not using drugs or products that contain nicotine and tobacco. Limiting alcohol use. What can I expect for my preventive care visit? Physical exam Your health care provider will check your: Height and weight. These may be used to calculate your BMI (body mass index). BMI is a measurement that tells if you are at a healthy weight. Heart rate and blood pressure. Body temperature. Skin for abnormal spots. Counseling Your health care provider may ask you questions about your: Past medical problems. Family's medical history. Alcohol, tobacco, and drug use. Emotional well-being. Home life and relationship well-being. Sexual activity. Diet, exercise, and sleep habits. Work and work environment. Access to firearms. Method of birth control. Menstrual cycle. Pregnancy history. What immunizations do I need? Vaccines are usually given at various ages, according to a schedule. Your health care provider will recommend vaccines for you based on your age, medical history, and lifestyle or other factors, such as travel or where you work. What tests do I need? Blood tests Lipid and cholesterol levels. These may be checked every 5 years, or more often if you are over 50 years old. Hepatitis C test. Hepatitis B test. Screening Lung cancer screening. You may have this screening every year starting at age 55 if you have a 30-pack-year history of smoking and currently smoke or have quit within the past 15 years. Colorectal cancer screening. All adults should have this screening starting at  age 50 and continuing until age 75. Your health care provider may recommend screening at age 45 if you are at increased risk. You will have tests every 1-10 years, depending on your results and the type of screening test. Diabetes screening. This is done by checking your blood sugar (glucose) after you have not eaten for a while (fasting). You may have this done every 1-3 years. Mammogram. This may be done every 1-2 years. Talk with your health care provider about when you should start having regular mammograms. This may depend on whether you have a family history of breast cancer. BRCA-related cancer screening. This may be done if you have a family history of breast, ovarian, tubal, or peritoneal cancers. Pelvic exam and Pap test. This may be done every 3 years starting at age 21. Starting at age 30, this may be done every 5 years if you have a Pap test in combination with an HPV test. Other tests STD (sexually transmitted disease) testing, if you are at risk. Bone density scan. This is done to screen for osteoporosis. You may have this scan if you are at high risk for osteoporosis. Talk with your health care provider about your test results, treatment options, and if necessary, the need for more tests. Follow these instructions at home: Eating and drinking  Eat a diet that includes fresh fruits and vegetables, whole grains, lean protein, and low-fat dairy products. Take vitamin and mineral supplements as recommended by your health care provider. Do not drink alcohol if: Your health care provider tells you not to drink. You are pregnant, may be pregnant, or are planning to become pregnant. If you drink alcohol: Limit how much you have to 0-1 drink a day. Be   aware of how much alcohol is in your drink. In the [place] one drink equals one 12 oz bottle of beer (355 mL), one 5 oz glass of wine (148 mL), or one 1 oz glass of hard liquor (44 mL). Lifestyle Take daily care of your teeth and  gums. Brush your teeth every morning and night with fluoride toothpaste. Floss one time each day. Stay active. Exercise for at least 30 minutes 5 or more days each week. Do not use any products that contain nicotine or tobacco, such as cigarettes, e-cigarettes, and chewing tobacco. If you need help quitting, ask your health care provider. Do not use drugs. If you are sexually active, practice safe sex. Use a condom or other form of protection to prevent STIs (sexually transmitted infections). If you do not wish to become pregnant, use a form of birth control. If you plan to become pregnant, see your health care provider for a prepregnancy visit. If told by your health care provider, take low-dose aspirin daily starting at age 63. Find healthy ways to cope with stress, such as: Meditation, yoga, or listening to music. Journaling. Talking to a trusted person. Spending time with friends and family. Safety Always wear your seat belt while driving or riding in a vehicle. Do not drive: If you have been drinking alcohol. Do not ride with someone who has been drinking. When you are tired or distracted. While texting. Wear a helmet and other protective equipment during sports activities. If you have firearms in your house, make sure you follow all gun safety procedures. What's next? Visit your health care provider once a year for an annual wellness visit. Ask your health care provider how often you should have your eyes and teeth checked. Stay up to date on all vaccines. This information is not intended to replace advice given to you by your health care provider. Make sure you discuss any questions you have with your health care provider. Document Revised: 03/19/2020 Document Reviewed: 09/20/2017 Elsevier Patient Education  2022 [employer].

## 2020-09-23 NOTE — Assessment & Plan Note (Signed)
ghm utd Check labs  See avs  

## 2020-09-23 NOTE — Assessment & Plan Note (Signed)
Stable Refill meds Uds/ contract updated

## 2020-10-07 ENCOUNTER — Other Ambulatory Visit: Payer: Self-pay | Admitting: Family Medicine

## 2020-10-07 DIAGNOSIS — R41840 Attention and concentration deficit: Secondary | ICD-10-CM

## 2020-10-07 NOTE — Telephone Encounter (Signed)
Requesting: Dextrostat Contract: 2014 UDS: 2014 Last OV: 09/23/20 Next OV: N/A Last Refill: 09/23/2020, #[id number] RF Database:   Please advise

## 2020-10-07 NOTE — Telephone Encounter (Signed)
Medication: dextroamphetamine (DEXTROSTAT) 10 MG tablet   Has the patient contacted their pharmacy? Yes.   (If no, request that the patient contact the pharmacy for the refill.) (If yes, when and what did the pharmacy advise?) Pharmacy never received   Preferred Pharmacy (with phone number or street name):  [facility] ([address]), [place]  [id number], [place])  [id number]  Phone:  [phone]    Agent: Please be advised that RX refills may take up to 3 business days. We ask that you follow-up with your pharmacy.

## 2020-10-08 MED ORDER — DEXTROAMPHETAMINE SULFATE 10 MG PO TABS: 20.0000 mg | ORAL_TABLET | Freq: Three times a day (TID) | ORAL | 0 refills | Status: DC

## 2020-10-13 ENCOUNTER — Ambulatory Visit
Admission: RE | Admit: 2020-10-13 | Discharge: 2020-10-13 | Disposition: A | Discharge status: Home / Self Care | Payer: BC Managed Care – PPO | Source: Ambulatory Visit | Attending: Family Medicine | Admitting: Family Medicine

## 2020-10-13 ENCOUNTER — Other Ambulatory Visit: Payer: Self-pay

## 2020-10-13 DIAGNOSIS — R42 Dizziness and giddiness: Secondary | ICD-10-CM

## 2020-10-22 ENCOUNTER — Telehealth: Payer: Self-pay | Admitting: Family Medicine

## 2020-10-22 NOTE — Telephone Encounter (Signed)
Patient is concerned about her nose bleeds, she stated she has had 2 within one month and doesn't know why. Please advice.

## 2020-10-22 NOTE — Telephone Encounter (Signed)
Pt seen on 09/01 for CPE....office visit?

## 2020-10-26 NOTE — Telephone Encounter (Signed)
Pt called. LVM to return call 

## 2020-11-01 ENCOUNTER — Other Ambulatory Visit: Payer: Self-pay | Admitting: Gynecology

## 2020-11-01 DIAGNOSIS — Z1231 Encounter for screening mammogram for malignant neoplasm of breast: Secondary | ICD-10-CM

## 2020-11-04 ENCOUNTER — Ambulatory Visit (INDEPENDENT_AMBULATORY_CARE_PROVIDER_SITE_OTHER): Payer: BC Managed Care – PPO | Admitting: Family Medicine

## 2020-11-04 ENCOUNTER — Encounter: Payer: Self-pay | Admitting: Family Medicine

## 2020-11-04 ENCOUNTER — Other Ambulatory Visit: Payer: Self-pay

## 2020-11-04 ENCOUNTER — Other Ambulatory Visit: Payer: Self-pay | Admitting: Family Medicine

## 2020-11-04 VITALS — BP 100/70 | HR 66 | Temp 97.8°F | Resp 18 | Ht 62.0 in | Wt 137.0 lb

## 2020-11-04 DIAGNOSIS — R079 Chest pain, unspecified: Secondary | ICD-10-CM | POA: Diagnosis not present

## 2020-11-04 DIAGNOSIS — R040 Epistaxis: Secondary | ICD-10-CM

## 2020-11-04 DIAGNOSIS — R42 Dizziness and giddiness: Secondary | ICD-10-CM | POA: Diagnosis not present

## 2020-11-04 DIAGNOSIS — R41840 Attention and concentration deficit: Secondary | ICD-10-CM

## 2020-11-04 MED ORDER — DEXTROAMPHETAMINE SULFATE 10 MG PO TABS: 20.0000 mg | ORAL_TABLET | Freq: Three times a day (TID) | ORAL | 0 refills | Status: DC

## 2020-11-04 NOTE — Patient Instructions (Signed)
Nosebleed, Adult A nosebleed is when blood comes out of the nose. Nosebleeds are common. Usually, they are not a sign of a serious condition. Nosebleeds can happen if a blood vessel in your nose starts to bleed or if the lining of your nose (mucous membrane) cracks. They are commonly caused by: Allergies. Colds. Picking your nose. Blowing your nose too hard. An injury from sticking an object into your nose or getting hit in the nose. Dry or cold air. Less common causes of nosebleeds include: Toxic fumes. Something abnormal in the nose or in the air-filled spaces in the bones of the face (sinuses). Growths in the nose, such as polyps. Blood thinners or conditions that cause blood to clot slowly. Certain illnesses or procedures that irritate or dry out the nasal passages. Follow these instructions at home: When you have a nosebleed:  Sit down and tilt your head slightly forward. Use a clean towel or tissue to pinch your nostrils under the bony part of your nose. After 5 minutes, let go of your nose and see if bleeding starts again. Do not release pressure before that time. If there is still bleeding, repeat the pinching and holding for 5 minutes or until the bleeding stops. Do not place tissues or gauze in the nose to stop the bleeding. Avoid lying down and avoid tilting your head backward. That may make blood collect in the throat and cause gagging or coughing. Use a nasal spray decongestant to help with a nosebleed as told by your health care provider. After a nosebleed: Avoid blowing your nose or sniffing for a number of hours. Avoid straining, lifting, or bending at the waist for several days. You may go back to other normal activities as you are able. If you are taking aspirin or blood thinners and you have nosebleeds, talk to your health care provider. These medicines make bleeding more likely. Ask your health care provider if you should stop taking the medicines or if you should  adjust the dose. Do not stop taking medicines that your health care provider has recommended unless he or she tells you to stop taking them. If your nosebleed was caused by dry mucous membranes, use over-the-counter saline nasal spray or gel and a humidifier as told by your health care provider. This will keep the mucous membranes moist and allow them to heal. If you need to use one of these products: Choose one that is water-soluble. Use only as much as you need and use it only as often as needed. Do not lie down right after you use it. If you get nosebleeds often, talk with your health care provider about medical treatments. Options may include: Nasal cautery. This treatment stops and prevents nosebleeds by using a chemical swab or electrical device to lightly burn tiny blood vessels inside the nose. Nasal packing. A gauze or other material is placed in the nose to keep constant pressure on the bleeding area. Contact a health care provider if you: Have a fever. Get nosebleeds often or more often than usual. Bruise very easily. Have a nosebleed from having something stuck in your nose. Have bleeding in your mouth. Vomit or cough up brown material. Have a nosebleed after you start a new medicine. Get help right away if: You have a nosebleed after a fall or a head injury. Your nosebleed does not go away after 20 minutes. You feel dizzy or weak. You have unusual bleeding from other parts of your body. You have unusual bruising on   other parts of your body. You become sweaty. You vomit blood. Summary A nosebleed is when blood comes out of the nose. Common causes include allergies, an injury to the nose, or cold or dry air. Initial treatment includes applying pressure for 5 minutes. Moisturizing the nose with saline nasal spray or gel after a nosebleed may help prevent future bleeding. Get help right away if your nosebleed does not go away after 20 minutes. This information is not intended  to replace advice given to you by your health care provider. Make sure you discuss any questions you have with your health care provider. Document Revised: 11/07/2018 Document Reviewed: 11/07/2018 Elsevier Patient Education  2022 Elsevier Inc.  

## 2020-11-04 NOTE — Assessment & Plan Note (Signed)
ekg --  nsr , no acute changes If chest pain reoccurs --- go to ER

## 2020-11-04 NOTE — Assessment & Plan Note (Signed)
If it occurs again -- pinch nose and use ice pack Can use afrin  If unable to stop bleeding in 10 min call office or go to ER

## 2020-11-04 NOTE — Progress Notes (Addendum)
Subjective:   By signing my name below, I, [name], attest that this documentation has been prepared under the direction and in the presence of Dr. Roma Schanz, DO. 11/04/2020    Patient ID: [name], female    DOB: 1965-05-14, 55 y.o.   MRN: [id number]  Chief Complaint  Patient presents with   Epistaxis    Pt states having 2 nose bleeds last month. Pt states no other sxs at the time and states coming out of the right side both times.     Epistaxis   Patient is in today for a office visit.  She complains of nosebleeds for the past month. She notes having 2 this past month. Both episodes happened during the day. She has not had episodes prior and does not know what is triggering it. She reports it is runny and takes around 4 minutes before the bleeding stops. She continues using Flonase to manage her allergies.  Her blood pressure is doing well during this visit.  She also reports having an episode of chest pain after an episode of dizziness. She notes walking up a set of stairs and opening a drawer prior to the episode.  She is due for vision care and is planning on scheduling an appointment after this visit.      Past Medical History:  Diagnosis Date   Allergy    Anemia    Anxiety    Asthma    Depression    GERD (gastroesophageal reflux disease)    Heart murmur    from childhood   Hiatal hernia    Hypertension    Peptic ulcer    UTI (urinary tract infection)     Past Surgical History:  Procedure Laterality Date   ABDOMINAL HYSTERECTOMY     fibroids   BIOPSY  07/11/2018   Procedure: BIOPSY;  Surgeon: [name], MD;  Location: [facility] ENDOSCOPY;  Service: Endoscopy;;   COLONOSCOPY  2014   ESOPHAGOGASTRODUODENOSCOPY (EGD) WITH PROPOFOL N/A 07/11/2018   Procedure: ESOPHAGOGASTRODUODENOSCOPY (EGD) WITH PROPOFOL;  Surgeon: [name], MD;  Location[facility] ENDOSCOPY;  Service: Endoscopy;  Laterality: N/A;   GASTRIC BYPASS  2007   HEMOSTASIS CLIP  PLACEMENT  07/11/2018   Procedure: HEMOSTASIS CLIP PLACEMENT;  Surgeon: [name], MD;  Location: [facility];  Service: Endoscopy;;   LAPAROTOMY N/A 03/08/2018   Procedure: EXPLORATORY LAPAROTOMY, REPAIR AND PATCH CLOSURE OF PERFORATED ULCER;  Surgeon: Georganna Skeans, MD;  Location: [facility];  Service: General;  Laterality: N/A;   TUBAL LIGATION      Family History  Problem Relation Age of Onset   Hypertension Father    Cancer Father        Prostate   Depression Sister    Depression Brother    Diabetes Maternal Grandmother    Alzheimer's disease Maternal Grandmother    Diabetes Maternal Grandfather    Liver disease Maternal Grandfather    Cancer Maternal Grandfather        Prostate   Diabetes Paternal Grandmother    Alzheimer's disease Paternal Grandmother    Diabetes Paternal Grandfather    Cancer Paternal Grandfather        Lung, Prostate   Stroke Mother    Alzheimer's disease Mother    Colon cancer Neg Hx    Esophageal cancer Neg Hx    Pancreatic cancer Neg Hx    Stomach cancer Neg Hx    Colon polyps Neg Hx    Rectal cancer Neg Hx  Social History   Socioeconomic History   Marital status: Married    Spouse name: Not on file   Number of children: Not on file   Years of education: Not on file   Highest education level: Not on file  Occupational History    Employer: [employer]    Comment: [employer]  Tobacco Use   Smoking status: Former    Types: Cigarettes    Quit date: 01/24/1987    Years since quitting: 33.8   Smokeless tobacco: Never  Vaping Use   Vaping Use: Former   Substances: Flavoring   Devices: has vaped with flavor but no nicotine. Been 8 years  Substance and Sexual Activity   Alcohol use: Not Currently    Alcohol/week: 35.0 standard drinks    Types: 35 Cans of beer per week   Drug use: No   Sexual activity: Not Currently  Other Topics Concern   Not on file  Social History Narrative   Regular exercise--no (because of knees)    Social Determinants of Health   Financial Resource Strain: Not on file  Food Insecurity: Not on file  Transportation Needs: Not on file  Physical Activity: Not on file  Stress: Not on file  Social Connections: Not on file  Intimate Partner Violence: Not on file    Outpatient Medications Prior to Visit  Medication Sig Dispense Refill   Acetaminophen (TYLENOL 8 HOUR ARTHRITIS PAIN PO) Take 2 tablets by mouth as needed.     Acetaminophen (TYLENOL PO) Take 1 tablet by mouth as needed (Rapid release tablet).     busPIRone (BUSPAR) 30 MG tablet Take 1 tablet (30 mg total) by mouth in the morning and at bedtime. 180 tablet 1   Cholecalciferol (VITAMIN D3) 50 MCG (2000 UT) capsule Take 1 capsule by mouth once daily 90 capsule 0   cyanocobalamin (,VITAMIN B-12,) 1000 MCG/ML injection Inject 1 mL (1,000 mcg total) into the muscle every 30 (thirty) days. 10 mL 0   cyclobenzaprine (FLEXERIL) 10 MG tablet Take 1 tablet by mouth three times daily as needed for muscle spasm 30 tablet 2   diazepam (VALIUM) 5 MG tablet Take 1 tablet by mouth as needed.     Diclofenac Sodium 2 % SOLN Apply 1 pump twice daily as needed. 112 g 1   estradiol (VIVELLE-DOT) 0.05 MG/24HR patch 1 patch 2 (two) times a week.     fluticasone (FLONASE) 50 MCG/ACT nasal spray Place 2 sprays into both nostrils daily. 16 g 5   furosemide (LASIX) 40 MG tablet Take 1 tablet by mouth once daily 90 tablet 0   Homeopathic Products (ALLERGY MEDICINE PO) Take 1 tablet by mouth as needed (Takes the Clortabs. Equate Brand).     HYDROcodone-acetaminophen (NORCO/VICODIN) 5-325 MG tablet Take 1 tablet by mouth every 6 (six) hours as needed. 15 tablet 0   loperamide (IMODIUM) 2 MG capsule Take 2 mg by mouth daily as needed.      omeprazole (PRILOSEC) 40 MG capsule TAKE 1 CAPSULE BY MOUTH TWICE DAILY. OPEN CAPSULE AND SPRINKLE ON APPLESAUCE. 180 capsule 1   Pancrelipase, Lip-Prot-Amyl, (ZENPEP) 40000-126000 units CPEP Take 3 capsules (120,000  units) by mouth WITH each meal, and 2 capsules (80,000 units) WITH each snack. 1170 capsule 0   Probiotic Product (ALIGN) CHEW Chew 2 tablets by mouth daily. Align pre and probiotic with vitamain C-take one tablet each Align pre and probiotic with Vitamin b12 for immune and energy-take one tablet each     rOPINIRole (  REQUIP) 1 MG tablet TAKE 1 TABLET (1 MG TOTAL) BY MOUTH AT BEDTIME. 90 tablet 0   sertraline (ZOLOFT) 100 MG tablet Take 200 mg by mouth daily.     SYRINGE-NEEDLE, DISP, 3 ML (B-D 3CC LUER-LOK SYR 25GX5/8") 25G X 5/8" 3 ML MISC USE AS DIRECTED WITH B-12 INJECTION 16 each 3   VITAMIN A PO Take by mouth.     dextroamphetamine (DEXTROSTAT) 10 MG tablet Take 2 tablets (20 mg total) by mouth 3 (three) times daily. 180 tablet 0   No facility-administered medications prior to visit.    Allergies  Allergen Reactions   Penicillins Other (See Comments)    Did it involve swelling of the face/tongue/throat, SOB, or low BP? Unk Did it involve sudden or severe rash/hives, skin peeling, or any reaction on the inside of your mouth or nose? Unk Did you need to seek medical attention at a hospital or doctor's office? Unk When did it last happen? Was 55 years old; reaction not recalled     If all above answers are "NO", may proceed with cephalosporin use.    Codeine Itching    Crazy dreams and itching-pt said she can take it with food    Review of Systems  Constitutional:  Negative for fever and malaise/fatigue.  HENT:  Positive for nosebleeds. Negative for congestion.   Eyes:  Negative for blurred vision.  Respiratory:  Negative for shortness of breath.   Cardiovascular:  Positive for chest pain. Negative for palpitations and leg swelling.  Gastrointestinal:  Negative for abdominal pain, blood in stool and nausea.  Genitourinary:  Negative for dysuria and frequency.  Musculoskeletal:  Negative for falls.  Skin:  Negative for rash.  Neurological:  Negative for dizziness, loss of  consciousness and headaches.  Endo/Heme/Allergies:  Negative for environmental allergies.  Psychiatric/Behavioral:  Negative for depression. The patient is not nervous/anxious.       Objective:    Physical Exam Vitals and nursing note reviewed.  Constitutional:      General: She is not in acute distress.    Appearance: Normal appearance. She is not ill-appearing.  HENT:     Head: Normocephalic and atraumatic.     Right Ear: External ear normal.     Left Ear: External ear normal.  Eyes:     Extraocular Movements: Extraocular movements intact.     Pupils: Pupils are equal, round, and reactive to light.  Cardiovascular:     Rate and Rhythm: Normal rate and regular rhythm.     Heart sounds: Normal heart sounds. No murmur heard.   No gallop.  Pulmonary:     Effort: Pulmonary effort is normal. No respiratory distress.     Breath sounds: Normal breath sounds. No wheezing or rales.  Skin:    General: Skin is warm and dry.  Neurological:     Mental Status: She is alert and oriented to person, place, and time.  Psychiatric:        Behavior: Behavior normal.        Judgment: Judgment normal.    BP 100/70 (BP Location: Right Arm, Patient Position: Sitting, Cuff Size: Normal)   Pulse 66   Temp 97.8 F (36.6 C) (Oral)   Resp 18   Ht 5\' 2"  (1.575 m)   Wt 137 lb (62.1 kg)   SpO2 99%   BMI 25.06 kg/m  Wt Readings from Last 3 Encounters:  11/04/20 137 lb (62.1 kg)  09/23/20 134 lb 6.4 oz (61 kg)  06/02/20  126 lb (57.2 kg)    Diabetic Foot Exam - Simple   No data filed    Lab Results  Component Value Date   WBC 3.9 (L) 09/23/2020   HGB 13.2 09/23/2020   HCT 39.4 09/23/2020   PLT 210.0 09/23/2020   GLUCOSE 88 09/23/2020   CHOL 191 09/23/2020   TRIG 61.0 09/23/2020   HDL 66.80 09/23/2020   LDLCALC 112 (H) 09/23/2020   ALT 18 09/23/2020   AST 25 09/23/2020   NA 141 09/23/2020   K 4.4 09/23/2020   CL 103 09/23/2020   CREATININE 0.84 09/23/2020   BUN 13 09/23/2020    CO2 34 (H) 09/23/2020   TSH 4.71 09/23/2020    Lab Results  Component Value Date   TSH 4.71 09/23/2020   Lab Results  Component Value Date   WBC 3.9 (L) 09/23/2020   HGB 13.2 09/23/2020   HCT 39.4 09/23/2020   MCV 90.8 09/23/2020   PLT 210.0 09/23/2020   Lab Results  Component Value Date   NA 141 09/23/2020   K 4.4 09/23/2020   CO2 34 (H) 09/23/2020   GLUCOSE 88 09/23/2020   BUN 13 09/23/2020   CREATININE 0.84 09/23/2020   BILITOT 0.4 09/23/2020   ALKPHOS 74 09/23/2020   AST 25 09/23/2020   ALT 18 09/23/2020   PROT 6.6 09/23/2020   ALBUMIN 4.1 09/23/2020   CALCIUM 9.3 09/23/2020   ANIONGAP 6 07/12/2018   GFR 78.21 09/23/2020   Lab Results  Component Value Date   CHOL 191 09/23/2020   Lab Results  Component Value Date   HDL 66.80 09/23/2020   Lab Results  Component Value Date   LDLCALC 112 (H) 09/23/2020   Lab Results  Component Value Date   TRIG 61.0 09/23/2020   Lab Results  Component Value Date   CHOLHDL 3 09/23/2020   No results found for: HGBA1C     Assessment & Plan:   Problem List Items Addressed This Visit       Unprioritized   Chest pain - Primary    ekg --  nsr , no acute changes If chest pain reoccurs --- go to ER        Relevant Orders   EKG 12-Lead (Completed)   CBC with Differential/Platelet   Comprehensive metabolic panel   TSH   Vitamin B12   VITAMIN D 25 Hydroxy (Vit-D Deficiency, Fractures)   Dizziness   Relevant Orders   CBC with Differential/Platelet   Comprehensive metabolic panel   TSH   Vitamin B12   VITAMIN D 25 Hydroxy (Vit-D Deficiency, Fractures)   Epistaxis    If it occurs again -- pinch nose and use ice pack Can use afrin  If unable to stop bleeding in 10 min call office or go to ER         No orders of the defined types were placed in this encounter.   I, Dr. Roma Schanz, DO, personally preformed the services described in this documentation.  All medical record entries made by the  scribe were at my direction and in my presence.  I have reviewed the chart and discharge instructions (if applicable) and agree that the record reflects my personal performance and is accurate and complete. 11/04/2020   I,[name],acting as a scribe for [name], DO.,have documented all relevant documentation on the behalf of [name], DO,as directed by  [name], DO while in the presence of Rosalita Chessman  Chase, DO.   [name], DO

## 2020-11-05 LAB — COMPREHENSIVE METABOLIC PANEL
ALT: 14 U/L (ref 0–35)
AST: 22 U/L (ref 0–37)
Albumin: 4.2 g/dL (ref 3.5–5.2)
Alkaline Phosphatase: 64 U/L (ref 39–117)
BUN: 13 mg/dL (ref 6–23)
CO2: 29 mEq/L (ref 19–32)
Calcium: 9.3 mg/dL (ref 8.4–10.5)
Chloride: 104 mEq/L (ref 96–112)
Creatinine, Ser: 0.72 mg/dL (ref 0.40–1.20)
GFR: 94.02 mL/min (ref >60.00)
Glucose, Bld: 97 mg/dL (ref 70–99)
Potassium: 4.3 mEq/L (ref 3.5–5.1)
Sodium: 140 mEq/L (ref 135–145)
Total Bilirubin: 0.3 mg/dL (ref 0.2–1.2)
Total Protein: 6.4 g/dL (ref 6.0–8.3)

## 2020-11-05 LAB — CBC WITH DIFFERENTIAL/PLATELET
Basophils Absolute: 0.1 10*3/uL (ref 0.0–0.1)
Basophils Relative: 1.2 % (ref 0.0–3.0)
Eosinophils Absolute: 0.4 10*3/uL (ref 0.0–0.7)
Eosinophils Relative: 7 % — ABNORMAL HIGH (ref 0.0–5.0)
HCT: 37.9 % (ref 36.0–46.0)
Hemoglobin: 12.6 g/dL (ref 12.0–15.0)
Lymphocytes Relative: 32.8 % (ref 12.0–46.0)
Lymphs Abs: 1.6 10*3/uL (ref 0.7–4.0)
MCHC: 33.1 g/dL (ref 30.0–36.0)
MCV: 91.6 fl (ref 78.0–100.0)
Monocytes Absolute: 0.4 10*3/uL (ref 0.1–1.0)
Monocytes Relative: 7.5 % (ref 3.0–12.0)
Neutro Abs: 2.6 10*3/uL (ref 1.4–7.7)
Neutrophils Relative %: 51.5 % (ref 43.0–77.0)
Platelets: 194 10*3/uL (ref 150.0–400.0)
RBC: 4.14 Mil/uL (ref 3.87–5.11)
RDW: 14.1 % (ref 11.5–15.5)
WBC: 5 10*3/uL (ref 4.0–10.5)

## 2020-11-05 LAB — TSH: TSH: 2.29 u[IU]/mL (ref 0.35–5.50)

## 2020-11-05 LAB — VITAMIN D 25 HYDROXY (VIT D DEFICIENCY, FRACTURES): VITD: 38.79 ng/mL (ref 30.00–100.00)

## 2020-11-05 LAB — VITAMIN B12: Vitamin B-12: 417 pg/mL (ref 211–911)

## 2020-11-17 NOTE — Telephone Encounter (Signed)
PT seen on 10/13

## 2020-11-18 ENCOUNTER — Ambulatory Visit: Payer: BC Managed Care – PPO

## 2020-12-09 ENCOUNTER — Other Ambulatory Visit: Payer: Self-pay | Admitting: Family Medicine

## 2020-12-09 DIAGNOSIS — M545 Low back pain, unspecified: Secondary | ICD-10-CM

## 2020-12-13 ENCOUNTER — Telehealth: Payer: Self-pay | Admitting: Gastroenterology

## 2020-12-13 NOTE — Telephone Encounter (Signed)
Patient no longer has insurance and Zenprep is $1700.  She said they really weren't helping and she's taking Imodium, which is not helping, and was taking 13 Zenprep daily, and it wasn't  really working either.  Now that she is self-pay and no longer has insurance, is there something else she can be given that may help her?  She states she is unable to go anywhere because sometimes she can't make it to the bathroom.  Also, she is applying for disability because of her IBS and back issues so she wanted to make you aware you would probably be getting records request.

## 2020-12-14 MED ORDER — COLESTIPOL HCL 1 G PO TABS: 1.0000 g | ORAL_TABLET | Freq: Two times a day (BID) | ORAL | 2 refills | Status: DC

## 2020-12-14 NOTE — Telephone Encounter (Signed)
Sorry to hear this. If Zenpep has not helped ([name] previously had very mild benefit), then [name] can stop it, especially due to cost. We do have other options to treat her symptoms however without insurance not sure how affordable some of these options may be for her. Certainly immodium, would be the cheapest scheduled q AM and additional doses as needed. We could try her on colestid 1gm BID, [name] would have to review this with her pharmacist in regards to if this would bind or interact with some of her other medications - but not sure how much this would be for her without insurance, we can order it and [name] can ask her pharmacist. [name] can certainly follow up with me in the office to discuss options as well

## 2020-12-14 NOTE — Telephone Encounter (Signed)
Called and spoke with patient in regards to Dr. Doyne Keel recommendations. Pt would like for me to send in RX for Colestid to [facility] on file in [place]. Pt will call the pharmacy to check on the price prior to picking up prescription. Pt will also see if she can find some coupons online to helps with cost. Pt verbalized understanding and had no concerns at the end of the call.

## 2020-12-14 NOTE — Telephone Encounter (Signed)
Dr. Armbruster, please see note below and advise. Thanks 

## 2020-12-21 ENCOUNTER — Telehealth: Payer: Self-pay | Admitting: Family Medicine

## 2020-12-21 NOTE — Telephone Encounter (Signed)
Pt is switching pharmacies and would like to go to the following below:  [facility], [place], [place] [address]  Tele: [phone]  Pt would like for someone to contact her to discuss generic medication due to no insurance. Please advise.

## 2020-12-22 ENCOUNTER — Ambulatory Visit: Payer: BC Managed Care – PPO

## 2020-12-23 ENCOUNTER — Other Ambulatory Visit: Payer: Self-pay | Admitting: Family Medicine

## 2020-12-23 DIAGNOSIS — F988 Other specified behavioral and emotional disorders with onset usually occurring in childhood and adolescence: Secondary | ICD-10-CM

## 2020-12-23 MED ORDER — AMPHETAMINE-DEXTROAMPHETAMINE 20 MG PO TABS: 20.0000 mg | ORAL_TABLET | Freq: Every day | ORAL | 0 refills | Status: DC

## 2020-12-23 NOTE — Telephone Encounter (Signed)
Patient stated that they do not have any insurance to cover medications.  The have spoken with their pharmacist at [facility] to try and help to save some money.    For the dextroamphetamine 10mg  tablet, the brand Adderall would be cheaper and also maybe a higher dose cause he takes 3 tabs daily?  Is there an alternate drug to take other than buspirone?

## 2020-12-24 NOTE — Telephone Encounter (Signed)
Patient notified

## 2021-01-26 ENCOUNTER — Telehealth: Payer: Self-pay | Admitting: Family Medicine

## 2021-01-26 DIAGNOSIS — M545 Low back pain, unspecified: Secondary | ICD-10-CM

## 2021-01-26 DIAGNOSIS — M79605 Pain in left leg: Secondary | ICD-10-CM

## 2021-01-26 DIAGNOSIS — E538 Deficiency of other specified B group vitamins: Secondary | ICD-10-CM

## 2021-01-26 DIAGNOSIS — E559 Vitamin D deficiency, unspecified: Secondary | ICD-10-CM

## 2021-01-26 DIAGNOSIS — R41840 Attention and concentration deficit: Secondary | ICD-10-CM

## 2021-01-26 DIAGNOSIS — F419 Anxiety disorder, unspecified: Secondary | ICD-10-CM

## 2021-01-26 DIAGNOSIS — M79604 Pain in right leg: Secondary | ICD-10-CM

## 2021-01-26 MED ORDER — FUROSEMIDE 40 MG PO TABS: 40.0000 mg | ORAL_TABLET | Freq: Every day | ORAL | 0 refills | Status: DC

## 2021-01-26 MED ORDER — "BD LUER-LOK SYRINGE 25G X 5/8"" 3 ML MISC": 0 refills | Status: DC

## 2021-01-26 MED ORDER — CYCLOBENZAPRINE HCL 10 MG PO TABS: 10.0000 mg | ORAL_TABLET | Freq: Three times a day (TID) | ORAL | 0 refills | Status: DC | PRN

## 2021-01-26 MED ORDER — BUSPIRONE HCL 30 MG PO TABS: 30.0000 mg | ORAL_TABLET | Freq: Two times a day (BID) | ORAL | 0 refills | Status: DC

## 2021-01-26 MED ORDER — SERTRALINE HCL 100 MG PO TABS: 200.0000 mg | ORAL_TABLET | Freq: Every day | ORAL | 0 refills | Status: DC

## 2021-01-26 MED ORDER — OMEPRAZOLE 40 MG PO CPDR: DELAYED_RELEASE_CAPSULE | ORAL | 0 refills | Status: DC

## 2021-01-26 MED ORDER — VITAMIN D3 50 MCG (2000 UT) PO CAPS: 2000.0000 [IU] | ORAL_CAPSULE | Freq: Every day | ORAL | 0 refills | Status: DC

## 2021-01-26 MED ORDER — CYANOCOBALAMIN 1000 MCG/ML IJ SOLN: 1000.0000 ug | INTRAMUSCULAR | 0 refills | Status: DC

## 2021-01-26 NOTE — Telephone Encounter (Signed)
Requesting: diazepam, dextroamphetamine ( did not get the adderrall last month cause it was too much) Contract: none UDS: none Last Visit: 11/22/20 Next Visit: 01/31/21 Last Refill: 11/04/20  Please Advise

## 2021-01-26 NOTE — Telephone Encounter (Signed)
Left message on machine to call back with which medications she needs and also if previous med has refills [facility] can call [facility] to transfer.

## 2021-01-26 NOTE — Telephone Encounter (Signed)
Pt needs all current medications transferred over to pharmacy below:  [facility], [address] [place] [address]  Phone:  [phone]  Fax:  [phone]

## 2021-01-26 NOTE — Telephone Encounter (Signed)
Pt called back, she stated [facility] cannot transfer any narcotics. She also does not know what remaining meds still need to be transferred, but she did call [facility] to start the process.

## 2021-01-26 NOTE — Telephone Encounter (Signed)
Spoke with patient to get what she needs.  She need diazepam, zoloft, dextroamphetamine, omeprazole, vit d, b12 with syringes, buspar, cyclobenzaprine, and furosemide.  All rxs sent in accept controlled meds

## 2021-01-27 ENCOUNTER — Other Ambulatory Visit: Payer: Self-pay | Admitting: Family Medicine

## 2021-01-27 DIAGNOSIS — R41840 Attention and concentration deficit: Secondary | ICD-10-CM

## 2021-01-27 MED ORDER — DIAZEPAM 5 MG PO TABS: 5.0000 mg | ORAL_TABLET | ORAL | 0 refills | Status: DC | PRN

## 2021-01-27 MED ORDER — DEXTROAMPHETAMINE SULFATE 10 MG PO TABS: 20.0000 mg | ORAL_TABLET | Freq: Three times a day (TID) | ORAL | 0 refills | Status: DC

## 2021-01-31 ENCOUNTER — Ambulatory Visit: Payer: 59 | Admitting: Family Medicine

## 2021-01-31 VITALS — BP 110/80 | HR 81 | Temp 98.4°F | Resp 18 | Ht 62.0 in | Wt 138.4 lb

## 2021-01-31 DIAGNOSIS — R41840 Attention and concentration deficit: Secondary | ICD-10-CM

## 2021-01-31 DIAGNOSIS — F419 Anxiety disorder, unspecified: Secondary | ICD-10-CM

## 2021-01-31 DIAGNOSIS — F4310 Post-traumatic stress disorder, unspecified: Secondary | ICD-10-CM

## 2021-01-31 DIAGNOSIS — R300 Dysuria: Secondary | ICD-10-CM | POA: Diagnosis not present

## 2021-01-31 DIAGNOSIS — M545 Low back pain, unspecified: Secondary | ICD-10-CM | POA: Diagnosis not present

## 2021-01-31 DIAGNOSIS — F1021 Alcohol dependence, in remission: Secondary | ICD-10-CM

## 2021-01-31 DIAGNOSIS — R8290 Unspecified abnormal findings in urine: Secondary | ICD-10-CM

## 2021-01-31 DIAGNOSIS — I10 Essential (primary) hypertension: Secondary | ICD-10-CM

## 2021-01-31 LAB — POC URINALSYSI DIPSTICK (AUTOMATED)
Blood, UA: NEGATIVE
Glucose, UA: NEGATIVE
Ketones, UA: NEGATIVE
Nitrite, UA: NEGATIVE
Protein, UA: NEGATIVE
Spec Grav, UA: >=1.03 — AB (ref 1.010–1.025)
Urobilinogen, UA: 0.2 E.U./dL
pH, UA: 5 (ref 5.0–8.0)

## 2021-01-31 MED ORDER — NITROFURANTOIN MONOHYD MACRO 100 MG PO CAPS: 100.0000 mg | ORAL_CAPSULE | Freq: Two times a day (BID) | ORAL | 0 refills | Status: DC

## 2021-01-31 NOTE — Progress Notes (Signed)
Subjective:   By signing my name below, I, [name], attest that this documentation has been prepared under the direction and in the presence of Mary Held, DO. 01/31/2021   Patient ID: [name], female    DOB: Nov 20, 1965, 56 y.o.   MRN: [id number]  Chief Complaint  Patient presents with   Back Pain    X2 month, Pt states having pain to urinate and bowel movement. Pt states noticing some burning this past week. No freq.     HPI Patient is in today for an office visit.  She reports symptoms of back pain for the past two months. Adds that she noticed dysuria in the past few days. Also mentions that when she has bowel movements, there is pain in her abdominal area and back. Endorses diarrhea. She drinks a lot of fluids but mentions that she urinates about once a day. She was given Colestid by the GI but it has not helped. Denies constipation. Urine- cloudy and foamy Feces- cloudy Urinalysis showed presence of bacteria.   She would like to start seeing a counselor or a psychiatrist because she has been having nightmares and things that happened in her childhood she would like to discuss.  She quit drinking alcohol in February 2022.  She will also need a disability letter that will address her PTSD, IBS and back pain. She has been unable to get any sleep and has social anxiety so she cannot go out in public. She is unable to work for long periods of time because her episodes of IBS are very sudden and she often has to buy new clothes.   Past Medical History:  Diagnosis Date   Allergy    Anemia    Anxiety    Asthma    Depression    GERD (gastroesophageal reflux disease)    Heart murmur    from childhood   Hiatal hernia    Hypertension    Peptic ulcer    UTI (urinary tract infection)     Past Surgical History:  Procedure Laterality Date   ABDOMINAL HYSTERECTOMY     fibroids   BIOPSY  07/11/2018   Procedure: BIOPSY;  Surgeon: [name], MD;  Location:  [facility] ENDOSCOPY;  Service: Endoscopy;;   COLONOSCOPY  2014   ESOPHAGOGASTRODUODENOSCOPY (EGD) WITH PROPOFOL N/A 07/11/2018   Procedure: ESOPHAGOGASTRODUODENOSCOPY (EGD) WITH PROPOFOL;  Surgeon: [name], MD;  Location: [facility] ENDOSCOPY;  Service: Endoscopy;  Laterality: N/A;   GASTRIC BYPASS  2007   HEMOSTASIS CLIP PLACEMENT  07/11/2018   Procedure: HEMOSTASIS CLIP PLACEMENT;  Surgeon: [name], MD;  Location: [place];  Service: Endoscopy;;   LAPAROTOMY N/A 03/08/2018   Procedure: EXPLORATORY LAPAROTOMY, REPAIR AND PATCH CLOSURE OF PERFORATED ULCER;  Surgeon: Georganna Skeans, MD;  Location: [place];  Service: General;  Laterality: N/A;   TUBAL LIGATION      Family History  Problem Relation Age of Onset   Hypertension Father    Cancer Father        Prostate   Depression Sister    Depression Brother    Diabetes Maternal Grandmother    Alzheimer's disease Maternal Grandmother    Diabetes Maternal Grandfather    Liver disease Maternal Grandfather    Cancer Maternal Grandfather        Prostate   Diabetes Paternal Grandmother    Alzheimer's disease Paternal Grandmother    Diabetes Paternal Grandfather    Cancer Paternal Grandfather  Lung, Prostate   Stroke Mother    Alzheimer's disease Mother    Colon cancer Neg Hx    Esophageal cancer Neg Hx    Pancreatic cancer Neg Hx    Stomach cancer Neg Hx    Colon polyps Neg Hx    Rectal cancer Neg Hx     Social History   Socioeconomic History   Marital status: Married    Spouse name: Not on file   Number of children: Not on file   Years of education: Not on file   Highest education level: Not on file  Occupational History    Employer: [employer]    Comment: [employer]  Tobacco Use   Smoking status: Former    Types: Cigarettes    Quit date: 01/24/1987    Years since quitting: 34.0   Smokeless tobacco: Never  Vaping Use   Vaping Use: Former   Substances: Flavoring   Devices: has vaped with flavor but no  nicotine. Been 8 years  Substance and Sexual Activity   Alcohol use: Not Currently    Alcohol/week: 35.0 standard drinks    Types: 35 Cans of beer per week   Drug use: No   Sexual activity: Not Currently  Other Topics Concern   Not on file  Social History Narrative   Regular exercise--no (because of knees)   Social Determinants of Health   Financial Resource Strain: Not on file  Food Insecurity: Not on file  Transportation Needs: Not on file  Physical Activity: Not on file  Stress: Not on file  Social Connections: Not on file  Intimate Partner Violence: Not on file    Outpatient Medications Prior to Visit  Medication Sig Dispense Refill   Acetaminophen (TYLENOL 8 HOUR ARTHRITIS PAIN PO) Take 2 tablets by mouth as needed.     amphetamine-dextroamphetamine (ADDERALL) 20 MG tablet Take 1 tablet (20 mg total) by mouth daily. 30 tablet 0   busPIRone (BUSPAR) 30 MG tablet Take 1 tablet (30 mg total) by mouth in the morning and at bedtime. 180 tablet 0   Cholecalciferol (VITAMIN D3) 50 MCG (2000 UT) capsule Take 1 capsule (2,000 Units total) by mouth daily. 90 capsule 0   colestipol (COLESTID) 1 g tablet Take 1 tablet (1 g total) by mouth 2 (two) times daily. 60 tablet 2   cyanocobalamin (,VITAMIN B-12,) 1000 MCG/ML injection Inject 1 mL (1,000 mcg total) into the muscle every 30 (thirty) days. 10 mL 0   cyclobenzaprine (FLEXERIL) 10 MG tablet Take 1 tablet (10 mg total) by mouth 3 (three) times daily as needed. for muscle spams 30 tablet 0   dextroamphetamine (DEXTROSTAT) 10 MG tablet Take 2 tablets (20 mg total) by mouth 3 (three) times daily. 540 tablet 0   diazepam (VALIUM) 5 MG tablet Take 1 tablet (5 mg total) by mouth as needed. 30 tablet 0   Diclofenac Sodium 2 % SOLN Apply 1 pump twice daily as needed. 112 g 1   estradiol (VIVELLE-DOT) 0.05 MG/24HR patch 1 patch 2 (two) times a week.     fluticasone (FLONASE) 50 MCG/ACT nasal spray Place 2 sprays into both nostrils daily. 16 g 5    furosemide (LASIX) 40 MG tablet Take 1 tablet (40 mg total) by mouth daily. 90 tablet 0   Homeopathic Products (ALLERGY MEDICINE PO) Take 1 tablet by mouth as needed (Takes the Clortabs. Equate Brand).     HYDROcodone-acetaminophen (NORCO/VICODIN) 5-325 MG tablet Take 1 tablet by mouth every 6 (six) hours  as needed. 15 tablet 0   loperamide (IMODIUM) 2 MG capsule Take 2 mg by mouth daily as needed.      omeprazole (PRILOSEC) 40 MG capsule TAKE 1 CAPSULE BY MOUTH TWICE DAILY. OPEN CAPSULE AND SPRINKLE ON APPLESAUCE. 180 capsule 0   Probiotic Product (ALIGN) CHEW Chew 2 tablets by mouth daily. Align pre and probiotic with vitamain C-take one tablet each Align pre and probiotic with Vitamin b12 for immune and energy-take one tablet each     rOPINIRole (REQUIP) 1 MG tablet TAKE 1 TABLET (1 MG TOTAL) BY MOUTH AT BEDTIME. 90 tablet 0   sertraline (ZOLOFT) 100 MG tablet Take 2 tablets (200 mg total) by mouth daily. 180 tablet 0   SYRINGE-NEEDLE, DISP, 3 ML (B-D 3CC LUER-LOK SYR 25GX5/8") 25G X 5/8" 3 ML MISC USE AS DIRECTED WITH B-12 INJECTION 16 each 0   VITAMIN A PO Take by mouth.     Acetaminophen (TYLENOL PO) Take 1 tablet by mouth as needed (Rapid release tablet).     Pancrelipase, Lip-Prot-Amyl, (ZENPEP) 40000-126000 units CPEP Take 3 capsules (120,000 units) by mouth WITH each meal, and 2 capsules (80,000 units) WITH each snack. 1170 capsule 0   No facility-administered medications prior to visit.    Allergies  Allergen Reactions   Penicillins Other (See Comments)    Did it involve swelling of the face/tongue/throat, SOB, or low BP? Unk Did it involve sudden or severe rash/hives, skin peeling, or any reaction on the inside of your mouth or nose? Unk Did you need to seek medical attention at a hospital or doctor's office? Unk When did it last happen? Was 56 years old; reaction not recalled     If all above answers are "NO", may proceed with cephalosporin use.    Codeine Itching    Crazy  dreams and itching-pt said she can take it with food    Review of Systems  Constitutional:  Negative for fever.  HENT:  Negative for congestion, ear pain, hearing loss, sinus pain and sore throat.   Eyes:  Negative for blurred vision and pain.  Respiratory:  Negative for cough, sputum production, shortness of breath and wheezing.   Cardiovascular:  Negative for chest pain and palpitations.  Gastrointestinal:  Positive for abdominal pain and diarrhea. Negative for blood in stool, constipation, nausea and vomiting.  Genitourinary:  Positive for dysuria. Negative for frequency, hematuria and urgency.  Musculoskeletal:  Positive for back pain. Negative for falls and myalgias.  Neurological:  Negative for dizziness, sensory change, loss of consciousness, weakness and headaches.  Endo/Heme/Allergies:  Negative for environmental allergies. Does not bruise/bleed easily.  Psychiatric/Behavioral:  Negative for depression and suicidal ideas. The patient is nervous/anxious. The patient does not have insomnia.       Objective:    Physical Exam Constitutional:      General: She is not in acute distress.    Appearance: Normal appearance. She is not ill-appearing.  HENT:     Head: Normocephalic and atraumatic.     Right Ear: External ear normal.     Left Ear: External ear normal.  Eyes:     Extraocular Movements: Extraocular movements intact.     Pupils: Pupils are equal, round, and reactive to light.  Cardiovascular:     Rate and Rhythm: Normal rate and regular rhythm.     Pulses: Normal pulses.     Heart sounds: Normal heart sounds. No murmur heard.   No gallop.  Pulmonary:     Effort:  Pulmonary effort is normal. No respiratory distress.     Breath sounds: Normal breath sounds. No wheezing, rhonchi or rales.  Abdominal:     General: Bowel sounds are normal. There is no distension.     Palpations: Abdomen is soft. There is no mass.     Tenderness: There is abdominal tenderness in the  suprapubic area. There is no guarding or rebound.     Hernia: No hernia is present.  Musculoskeletal:     Cervical back: Normal range of motion and neck supple.  Lymphadenopathy:     Cervical: No cervical adenopathy.  Skin:    General: Skin is warm and dry.  Neurological:     Mental Status: She is alert and oriented to person, place, and time.  Psychiatric:        Behavior: Behavior normal.    BP 110/80 (BP Location: Right Arm, Patient Position: Sitting, Cuff Size: Normal)    Pulse 81    Temp 98.4 F (36.9 C) (Oral)    Resp 18    Ht 5\' 2"  (1.575 m)    Wt 138 lb 6.4 oz (62.8 kg)    SpO2 99%    BMI 25.31 kg/m  Wt Readings from Last 3 Encounters:  01/31/21 138 lb 6.4 oz (62.8 kg)  11/04/20 137 lb (62.1 kg)  09/23/20 134 lb 6.4 oz (61 kg)    Diabetic Foot Exam - Simple   No data filed    Lab Results  Component Value Date   WBC 5.0 01/31/2021   HGB 12.7 01/31/2021   HCT 38.6 01/31/2021   PLT 209.0 01/31/2021   GLUCOSE 89 01/31/2021   CHOL 191 09/23/2020   TRIG 61.0 09/23/2020   HDL 66.80 09/23/2020   LDLCALC 112 (H) 09/23/2020   ALT 16 01/31/2021   AST 21 01/31/2021   NA 141 01/31/2021   K 5.3 (H) 01/31/2021   CL 106 01/31/2021   CREATININE 0.99 01/31/2021   BUN 17 01/31/2021   CO2 29 01/31/2021   TSH 2.29 11/04/2020    Lab Results  Component Value Date   TSH 2.29 11/04/2020   Lab Results  Component Value Date   WBC 5.0 01/31/2021   HGB 12.7 01/31/2021   HCT 38.6 01/31/2021   MCV 92.8 01/31/2021   PLT 209.0 01/31/2021   Lab Results  Component Value Date   NA 141 01/31/2021   K 5.3 (H) 01/31/2021   CO2 29 01/31/2021   GLUCOSE 89 01/31/2021   BUN 17 01/31/2021   CREATININE 0.99 01/31/2021   BILITOT 0.3 01/31/2021   ALKPHOS 66 01/31/2021   AST 21 01/31/2021   ALT 16 01/31/2021   PROT 6.9 01/31/2021   ALBUMIN 4.3 01/31/2021   CALCIUM 9.2 01/31/2021   ANIONGAP 6 07/12/2018   GFR 64.05 01/31/2021   Lab Results  Component Value Date   CHOL 191  09/23/2020   Lab Results  Component Value Date   HDL 66.80 09/23/2020   Lab Results  Component Value Date   LDLCALC 112 (H) 09/23/2020   Lab Results  Component Value Date   TRIG 61.0 09/23/2020   Lab Results  Component Value Date   CHOLHDL 3 09/23/2020   No results found for: HGBA1C     Assessment & Plan:   Problem List Items Addressed This Visit       Unprioritized   Alcohol dependence in remission ([place])    In remission       Anxiety    Severe ---  con't meds       Attention deficit    Stable  con't meds      Essential hypertension    Well controlled, no changes to meds. Encouraged heart healthy diet such as the DASH diet and exercise as tolerated.       Low back pain with radiation    Stable ---  Frequent flares Uds/ contract utd con't' pain meds      Other Visit Diagnoses     Bilateral low back pain, unspecified chronicity, unspecified whether sciatica present    -  Primary   Relevant Orders   POCT Urinalysis Dipstick (Automated) (Completed)   CBC with Differential/Platelet (Completed)   Comprehensive metabolic panel (Completed)   Abnormal urine       Relevant Orders   CBC with Differential/Platelet (Completed)   Comprehensive metabolic panel (Completed)   Abnormal urine finding       Relevant Orders   Urine Culture (Completed)   Dysuria       Relevant Medications   nitrofurantoin, macrocrystal-monohydrate, (MACROBID) 100 MG capsule   PTSD (post-traumatic stress disorder)       Relevant Orders   Ambulatory referral to Psychiatry   Difficult or painful urination       Relevant Orders   Ambulatory referral to Urology   CBC with Differential/Platelet (Completed)   Comprehensive metabolic panel (Completed)       Meds ordered this encounter  Medications   nitrofurantoin, macrocrystal-monohydrate, (MACROBID) 100 MG capsule    Sig: Take 1 capsule (100 mg total) by mouth 2 (two) times daily.    Dispense:  14 capsule    Refill:  0     I,[name],acting as a scribe for [employer], DO.,have documented all relevant documentation on the behalf of Mary Held, DO,as directed by  Mary Held, DO while in the presence of Mary Held, DO.   I, Mary Held, DO. , personally preformed the services described in this documentation.  All medical record entries made by the scribe were at my direction and in my presence.  I have reviewed the chart and discharge instructions (if applicable) and agree that the record reflects my personal performance and is accurate and complete. 01/31/2021

## 2021-01-31 NOTE — Patient Instructions (Signed)
Post-Traumatic Stress Disorder, Adult Post-traumatic stress disorder (PTSD) is a mental health disorder that can occur after a traumatic event, such as a threat to life, serious injury, or sexual violence. Sometimes, PTSD can occur in people who hear about trauma that occurs to a close family member or friend. PTSD can happen to anyone at any age. What are the causes? The condition may be caused by experiencing a traumatic event. What increases the risk? This condition is more likely to occur in: Engineer, manufacturing. People who are in circumstances where their lives are threatened. People who have been the victim of, or witness to, a traumatic event, such as: Domestic violence. Physical or sexual abuse. Rape. A terrorist act or gun violence. Natural disasters. Accidents involving serious injury. What are the signs or symptoms? PTSD symptoms may start soon after a frightening event or months or years later. Symptoms last at least one month and tend to disrupt relationships, work, and daily activities. Symptoms of PTSD can be grouped into several categories. Intrusive symptoms This is when you re-experience the physical and emotional sensations of the traumatic event through one or more of the following ways: Having upsetting dreams. Feeling fear, horror, intense sadness, or anger in response to a reminder of the trauma. Having unwanted upsetting memories while awake. Having physical reactions triggered by reminders of the trauma, such as increased heart rate, shortness of breath, sweating, and shaking. Having flashbacks, or feeling like you are going through the event again. Avoidance symptoms This is when you avoid anything that reminds you of the trauma. Symptoms may also include: Losing interest or not participating in daily activities. Feeling disconnected from or avoiding other people. Isolating yourself. Increased arousal symptoms You may have physical or emotional  reactions triggered by your environment. Symptoms may include: Being easily startled. Behaving in a careless or self-destructive way. Becoming easily irritated. Feeling worried and nervous. Having trouble concentrating. Yelling at or hitting other people or objects. Having trouble sleeping. Negative mood and thoughts Believing that you or others are bad. Feeling fear, horror, anger, sadness, guilt, or shame regularly. Not being able to remember certain parts of the traumatic event. Blaming yourself or others for the trauma. Being unable to experience positive emotions, such as happiness or love. How is this diagnosed? PTSD is diagnosed through an assessment by a mental health professional. [name] will be asked questions about your symptoms. How is this treated? Treatment for this condition may include any of the following or a combination: Taking medicines to reduce PTSD symptoms. Having counseling with a mental health professional or therapist who is experienced in treating PTSD. Doing eye movement desensitization and reprocessing therapy (EMDR). This type of therapy occurs with a specialized therapist. If you have other mental health concerns, these conditions will also be treated. Follow these instructions at home: Lifestyle Find a support group in your community. Groups are often available for [employer] veterans, trauma victims, and family members or caregivers. Try to get 7-9 hours of sleep each night. To help with sleep: Keep your bedroom cool and dark. Do not eat a heavy meal within 1 hour of bedtime. Do not drink alcohol or caffeinated drinks before bed. Avoid screen time, such as television, computers, tablets, or mobile phones, before bed. Do not use illegal drugs. Contact a local organization to find out if you are eligible for a service dog. Activity Exercise regularly. Try to do at least 30 minutes of physical activity most days of the week. Practice self-calming  through: Breathing exercises. Meditation. Yoga. Listening to quiet music. Do not isolate yourself. Make connections with other people. Consider volunteering. Volunteering can help you feel more connected. Eating and drinking Do not drink alcohol if: Your health care provider tells you not to drink. You are pregnant, may be pregnant, or are planning to become pregnant. If you drink alcohol: Limit how much you use to: 0-1 drink a day for women. 0-2 drinks a day for men. Be aware of how much alcohol is in your drink. In the [place] one drink equals one 12 oz bottle of beer (355 mL), one 5 oz glass of wine (148 mL), or one 1 oz glass of hard liquor (44 mL). General instructions Take steps to help yourself feel safer at home, such as by installing a security system. Work with a health care provider or therapist to help manage your symptoms. Take over-the-counter and prescription medicines as told by your health care provider. Let others know that you have PTSD and the things that may trigger symptoms. This can protect you and help them understand you better. If your PTSD is affecting your marriage or family, seek help from a family therapist. Make sure to let all of your health care providers know you have PTSD. This is especially important if you are having surgery or need to be admitted to the hospital. Keep all follow-up visits as told by your health care provider. This is important. Contact a health care provider if: Your symptoms do not get better. You are feeling overwhelmed by your symptoms. Get help right away if: You have thoughts of hurting yourself or others. If you ever feel like you may hurt yourself or others, or have thoughts about taking your own life, get help right away. You can go to your nearest emergency department or call: Your local emergency services (911 in the [place] A suicide crisis helpline, such as the [place] at 367 555 5350 or 988  in the [place]. This is open 24 hours a day. Summary Post-traumatic stress disorder (PTSD) is a mental health disorder that can occur after a traumatic event. Treatment for PTSD may include medicines, counseling, eye movement desensitization and reprocessing therapy (EMDR), or a combination of therapies. Find a support group in your community. Get help right away if you have thoughts of hurting yourself or others. This information is not intended to replace advice given to you by your health care provider. Make sure you discuss any questions you have with your health care provider. Document Revised: 09/02/2020 Document Reviewed: 03/21/2018 Elsevier Patient Education  [employer].

## 2021-02-01 ENCOUNTER — Telehealth: Payer: Self-pay | Admitting: Family Medicine

## 2021-02-01 LAB — COMPREHENSIVE METABOLIC PANEL
ALT: 16 U/L (ref 0–35)
AST: 21 U/L (ref 0–37)
Albumin: 4.3 g/dL (ref 3.5–5.2)
Alkaline Phosphatase: 66 U/L (ref 39–117)
BUN: 17 mg/dL (ref 6–23)
CO2: 29 mEq/L (ref 19–32)
Calcium: 9.2 mg/dL (ref 8.4–10.5)
Chloride: 106 mEq/L (ref 96–112)
Creatinine, Ser: 0.99 mg/dL (ref 0.40–1.20)
GFR: 64.05 mL/min (ref >60.00)
Glucose, Bld: 89 mg/dL (ref 70–99)
Potassium: 5.3 mEq/L — ABNORMAL HIGH (ref 3.5–5.1)
Sodium: 141 mEq/L (ref 135–145)
Total Bilirubin: 0.3 mg/dL (ref 0.2–1.2)
Total Protein: 6.9 g/dL (ref 6.0–8.3)

## 2021-02-01 LAB — CBC WITH DIFFERENTIAL/PLATELET
Basophils Absolute: 0 10*3/uL (ref 0.0–0.1)
Basophils Relative: 0.8 % (ref 0.0–3.0)
Eosinophils Absolute: 0.3 10*3/uL (ref 0.0–0.7)
Eosinophils Relative: 5.6 % — ABNORMAL HIGH (ref 0.0–5.0)
HCT: 38.6 % (ref 36.0–46.0)
Hemoglobin: 12.7 g/dL (ref 12.0–15.0)
Lymphocytes Relative: 36.6 % (ref 12.0–46.0)
Lymphs Abs: 1.8 10*3/uL (ref 0.7–4.0)
MCHC: 33 g/dL (ref 30.0–36.0)
MCV: 92.8 fl (ref 78.0–100.0)
Monocytes Absolute: 0.4 10*3/uL (ref 0.1–1.0)
Monocytes Relative: 8 % (ref 3.0–12.0)
Neutro Abs: 2.4 10*3/uL (ref 1.4–7.7)
Neutrophils Relative %: 49 % (ref 43.0–77.0)
Platelets: 209 10*3/uL (ref 150.0–400.0)
RBC: 4.16 Mil/uL (ref 3.87–5.11)
RDW: 13 % (ref 11.5–15.5)
WBC: 5 10*3/uL (ref 4.0–10.5)

## 2021-02-01 NOTE — Telephone Encounter (Signed)
Spoke with patient. Pt was notified that her GYN placed the order for her mammogram and she would need to contact them to change the order.

## 2021-02-01 NOTE — Telephone Encounter (Signed)
Patient states they no do not take her new insurance at the breast center and she would like to schedule a mammogram since she is over due. Please advice.

## 2021-02-02 ENCOUNTER — Telehealth: Payer: Self-pay | Admitting: Family Medicine

## 2021-02-02 LAB — URINE CULTURE
MICRO NUMBER:: 12844904
SPECIMEN QUALITY:: ADEQUATE

## 2021-02-02 NOTE — Telephone Encounter (Signed)
Patient states the behavioral Health place she was referred to, does not have any available openings. The one they changed her to, did not take her insurance. [facility] at [address] does take her insurance, so she would like the referral to be sent there instead. Please advice.

## 2021-02-03 ENCOUNTER — Ambulatory Visit: Payer: 59 | Attending: Internal Medicine

## 2021-02-03 ENCOUNTER — Encounter (HOSPITAL_BASED_OUTPATIENT_CLINIC_OR_DEPARTMENT_OTHER): Payer: Self-pay

## 2021-02-03 ENCOUNTER — Ambulatory Visit (HOSPITAL_BASED_OUTPATIENT_CLINIC_OR_DEPARTMENT_OTHER)
Admission: RE | Admit: 2021-02-03 | Discharge: 2021-02-03 | Disposition: A | Discharge status: Home / Self Care | Payer: 59 | Source: Ambulatory Visit | Attending: Gynecology | Admitting: Gynecology

## 2021-02-03 ENCOUNTER — Other Ambulatory Visit: Payer: Self-pay

## 2021-02-03 DIAGNOSIS — Z23 Encounter for immunization: Secondary | ICD-10-CM

## 2021-02-03 DIAGNOSIS — Z1231 Encounter for screening mammogram for malignant neoplasm of breast: Secondary | ICD-10-CM | POA: Diagnosis present

## 2021-02-03 NOTE — Progress Notes (Signed)
° °  Covid-19 Vaccination Clinic  Name:  [name]    MRN: [id number] DOB: 23-Dec-1965  02/03/2021  Ms. Schremp was observed post Covid-19 immunization for 15 minutes without incident. She was provided with Vaccine Information Sheet and instruction to access the V-Safe system.   Ms. Augspurger was instructed to call 911 with any severe reactions post vaccine: Difficulty breathing  Swelling of face and throat  A fast heartbeat  A bad rash all over body  Dizziness and weakness   Immunizations Administered     Name Date Dose VIS Date Route   Pfizer Covid-19 Vaccine Bivalent Booster 02/03/2021  2:33 PM 0.3 mL 09/22/2020 Intramuscular   Manufacturer: [place]   Lot: [id number]   [place]: [phone]

## 2021-02-04 ENCOUNTER — Other Ambulatory Visit (HOSPITAL_BASED_OUTPATIENT_CLINIC_OR_DEPARTMENT_OTHER): Payer: Self-pay

## 2021-02-04 MED ORDER — PFIZER COVID-19 VAC BIVALENT 30 MCG/0.3ML IM SUSP
INTRAMUSCULAR | 0 refills | Status: DC
  Filled 2021-02-04: qty 0.3, 1d supply, fill #0

## 2021-02-06 ENCOUNTER — Encounter: Payer: Self-pay | Admitting: Family Medicine

## 2021-02-06 NOTE — Assessment & Plan Note (Signed)
In remission.

## 2021-02-06 NOTE — Assessment & Plan Note (Signed)
Severe --- con't meds

## 2021-02-06 NOTE — Assessment & Plan Note (Signed)
Stable con't meds 

## 2021-02-06 NOTE — Assessment & Plan Note (Signed)
Stable ---  Frequent flares Uds/ contract utd con't' pain meds

## 2021-02-06 NOTE — Assessment & Plan Note (Signed)
Well controlled, no changes to meds. Encouraged heart healthy diet such as the DASH diet and exercise as tolerated.  °

## 2021-02-07 ENCOUNTER — Encounter: Payer: Self-pay | Admitting: Family Medicine

## 2021-02-08 ENCOUNTER — Telehealth: Payer: Self-pay | Admitting: Gastroenterology

## 2021-02-08 NOTE — Telephone Encounter (Signed)
Chart message from pt:  "Pain in between rib cage inch or 2 on old incision"

## 2021-02-08 NOTE — Telephone Encounter (Signed)
Returned call to patient. She states that she has been having pain in her "back and front" for about 2 weeks. She recently went to her PCP, upon assessment pt stated that she had some tender areas on her abdomen and where her incision is. She states that the pain is not noticeable until you press on the area. She states that it has a dull ache right now since she has been pressing on it. PCP recently started her on Omeprazole 40 mg BID. I advised pt to continue that until her appt. She states that she has had back pain for a while, she also has a UTI. She also had some concerns about IBS. Pt has been scheduled for a follow up appt with Dr. Havery Moros on Tuesday, 02/15/21 at 4 pm. Pt verbalized understanding and had no concerns at the end of the call.

## 2021-02-10 NOTE — Telephone Encounter (Signed)
Do we need to place new referral?

## 2021-02-10 NOTE — Telephone Encounter (Signed)
Patient states she needs her referral sent to [facility] not [facility] because of insurance. Patient gave fax number for them: [phone]. Please send to [place].

## 2021-02-15 ENCOUNTER — Ambulatory Visit: Payer: 59 | Admitting: Gastroenterology

## 2021-02-15 ENCOUNTER — Encounter: Payer: Self-pay | Admitting: Gastroenterology

## 2021-02-15 VITALS — BP 150/82 | HR 97 | Ht 62.0 in | Wt 137.0 lb

## 2021-02-15 DIAGNOSIS — K529 Noninfective gastroenteritis and colitis, unspecified: Secondary | ICD-10-CM | POA: Diagnosis not present

## 2021-02-15 DIAGNOSIS — Z9884 Bariatric surgery status: Secondary | ICD-10-CM

## 2021-02-15 DIAGNOSIS — R1011 Right upper quadrant pain: Secondary | ICD-10-CM

## 2021-02-15 NOTE — Patient Instructions (Addendum)
If you are age 56 or older, your body mass index should be between 23-30. Your Body mass index is 25.06 kg/m. If this is out of the aforementioned range listed, please consider follow up with your Primary Care Provider.  If you are age 58 or younger, your body mass index should be between 19-25. Your Body mass index is 25.06 kg/m. If this is out of the aformentioned range listed, please consider follow up with your Primary Care Provider.   ________________________________________________________  The [facility] providers would like to encourage you to use [facility] to communicate with providers for non-urgent requests or questions.  Due to long hold times on the telephone, sending your provider a message by [facility] may be a faster and more efficient way to get a response.  Please allow 48 business hours for a response.  Please remember that this is for non-urgent requests.  _______________________________________________________  [name] have been scheduled for a HIDA scan at [facility] (1st floor) on Monday, 02-28-21. Please arrive 30 minutes prior to your scheduled appointment at 2:75 am. Make certain not to have anything to eat or drink at least 6 hours prior to your test. Should this appointment date or time not work well for you, please call radiology scheduling at [phone].  Hold pain opioid medications 6 hours prior to procedure. _____________________________________________________________________  hepatobiliary (HIDA) scan is an imaging procedure used to diagnose problems in the liver, gallbladder and bile ducts. In the HIDA scan, a radioactive chemical or tracer is injected into a vein in your arm. The tracer is handled by the liver like bile. Bile is a fluid produced and excreted by your liver that helps your digestive system break down fats in the foods you eat. Bile is stored in your gallbladder and the gallbladder releases the bile when you eat a meal. A special nuclear  medicine scanner (gamma camera) tracks the flow of the tracer from your liver into your gallbladder and small intestine.  During your HIDA scan  You'll be asked to change into a hospital gown before your HIDA scan begins. Your health care team will position you on a table, usually on your back. The radioactive tracer is then injected into a vein in your arm.The tracer travels through your bloodstream to your liver, where it's taken up by the bile-producing cells. The radioactive tracer travels with the bile from your liver into your gallbladder and through your bile ducts to your small intestine.You may feel some pressure while the radioactive tracer is injected into your vein. As you lie on the table, a special gamma camera is positioned over your abdomen taking pictures of the tracer as it moves through your body. The gamma camera takes pictures continually for about an hour. You'll need to keep still during the HIDA scan. This can become uncomfortable, but you may find that you can lessen the discomfort by taking deep breaths and thinking about other things. Tell your health care team if you're uncomfortable. The radiologist will watch on a computer the progress of the radioactive tracer through your body. The HIDA scan may be stopped when the radioactive tracer is seen in the gallbladder and enters your small intestine. This typically takes about an hour. In some cases extra imaging will be performed if original images aren't satisfactory, if morphine is given to help visualize the gallbladder or if the medication CCK is given to look at the contraction of the gallbladder. This test typically takes 2 hours to complete. ________________________________________________________________________  Take Colestid 1 g: Take twice a day  Continue Imodium  Hold Zenpep.  Continue omeprazole.  We can fax or mail a copy of today's visit to your lawyer. Please provide the contact information.  Thank you for  entrusting me with your care and for choosing [facility], Dr. Newtown Cellar

## 2021-02-15 NOTE — Progress Notes (Signed)
HPI :  56 y/o female with a history of Roux-en-Y gastric bypass in 2007, history of NSAID use with perforated marginal ulcer at the gastrojejunal anastomosis requiring ex lap and patch repair in February 2020.  She has been on omeprazole capsules, crack open prior to ingestion, twice daily, since that time and has had interval healing of ulcers, last EGD February 2022.  She has not been seen since May 2022.  The other issue she has had trouble with has been chronic diarrhea.  She had a colonoscopy with me in May which showed no polyps and she had no evidence of microscopic colitis.  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  Trials of Zenpep at lower and escalating doses did not provide much of any benefit.  Confounding this is also been her alcohol use, she was drinking at least 6 beers a day if not more for some time and eventually stopped on her own, her last drink was February 1 of last year.  Unfortunately stopping alcohol did not help her bowels.  That being said lately she has been doing better than she has in the past.  She has been taking Imodium, previously upwards of 6 tabs per day, now down to about 3 tabs per day.  She has been having significant back pain and has been taking Vicodin for this, which is also work to slow her stools down a little bit.  That being said she still has looser stools with some urgency but not as bad as it used to be.  I had prescribed her some Colestid empirically but she never took it as her insurance lapsed.  She is currently unemployed and filing for disability due to her degenerative disc disease and lower back and neck arthritis, she has not build to function for work.  When her loose stools were worse she also had problems with incontinence and problems keeping her job.  She denies any blood in her stool.  Has had some dark brown stools lately but form is better than it had been in the past.  He denies any weight loss.  Occasional  nausea but no vomiting.  She continues to have postprandial upper abdominal pain.  Lately she has been feeling this in the right upper quadrant after eating.  She last had imaging with an ultrasound in 2020 which showed no gallstones in her gallbladder and she had a normal-appearing pancreas without any evidence of chronic pancreatitis.  She inquires about further evaluation of this.  Her last EGD was February 2022 which showed no recurrent marginal ulcers.  She continues to be compliant with PPI (cracks capsule open prior to ingestion) and avoids any NSAIDs.  She is frustrated by her persistent symptoms.  She is applying for disability and asking that we send our clinic note to her legal team.  Prior workup: EGD 10/17/2018: Esophagogastric landmarks identified. - 1 cm hiatal hernia. - Z-line irregular. Biopsied. - Roux-en-Y gastrojejunostomy with gastrojejunal anastomosis characterized by healthy appearing mucosa - healed ulcerations with residual clip that remains and very small aspect of a buried staple - both without ulceration. I did not remove either at this time, I'm not convinced this is causing her symptoms but can consider a repeat exam in another few months   1. Surgical [P], gastric - CHRONIC INACTIVE GASTRITIS, MILD. - THERE IS NO EVIDENCE OF HELICOBACTER PYLORI, DYSPLASIA, OR MALIGNANCY. - SEE COMMENT. 2. Surgical [P], distal esophagus - GASTROESOPHAGEAL JUNCTION MUCOSA WITH  MILD INFLAMMATION CONSISTENT WITH GASTROESOPHAGEAL REFLUX. - THERE IS NO EVIDENCE OF GOBLET CELL METAPLASIA, DYSPLASIA, OR MALIGNANCY.     EGD 07/11/2018: Evidence of a Roux-en-Y gastrojejunostomy was found. Pouch measured 5 cm from EGJ to anastomosis. The gastrojejunal anastomosis was characterized by friable mucosa. Two clean based ulcers on jejunal side of anastomsis, one measuring 58mm and one measuring 6 mm. A suture was noted adjacent to the smaller ulcer. This anastomosis was traversed. Afferent and  efferent limbs examined about 15 cm each and appeared normal. After withdrawal from the efferent limb there was an area on the gastric side of the anastomosis with persistent bleeding felt due to contact by the scope. 2 clips were applied with hemostasis acheived. A 3rd clip did not engage the tissue and it was removed orally with a forceps. - Two clean based marginal ulcers. - Z-line variable, at the gastroesophageal junction.  - Small hiatal hernia. - Roux-en-Y gastrojejunostomy with gastrojejunal anastomosis characterized by friable mucosa and contact bleeding requiring 2 clips for hemostasis.   ESOPHAGEAL SQUAMOUS AND CARDIAC MUCOSA WITH NO SPECIFIC HISTOPATHOLOGIC CHANGES - NEGATIVE FOR INTESTINAL METAPLASIA OR DYSPLASIA    EGD 03/11/20: - The exam of the esophagus was otherwise normal. - Evidence of a Roux-en-Y gastrojejunostomy was found. The gastrojejunal anastomosis was characterized by healthy appearing mucosa and an intact buried staple. Staple was not removed after a few attempts at removal with forceps, buried deeply and not causing any ulceration. - The exam of the gastric pouch was otherwise normal. No obvious fistula, etc. - Biopsies were taken with a cold forceps in the gastric body for Helicobacter pylori testing. - The examined small bowel limb was deeply intubated and normal.  1. Surgical [P], gastric pouch - GASTRIC OXYNTIC MUCOSA WITH CHRONIC GASTRITIS - WARTHIN STARRY STAIN IS NEGATIVE FOR HELICOBACTER PYLORI 2. Surgical [P], esophagus, GE junction - CARDIAC MUCOSA WITH NO SPECIFIC HISTOPATHOLOGIC CHANGES - NEGATIVE FOR INTESTINAL METAPLASIA OR DYSPLASIA  Colonoscopy 06/02/20: The perianal and digital rectal examinations were normal. - The terminal ileum appeared normal. - A few small-mouthed diverticula were found in the sigmoid colon. - Internal hemorrhoids were found during retroflexion. The hemorrhoids were small. - The exam was otherwise without  abnormality. - Biopsies for histology were taken with a cold forceps from the right colon, left colon and transverse colon for evaluation of microscopic colitis.  Diagnosis Surgical [P], colon nos, random sites - COLONIC MUCOSA WITH NO SPECIFIC HISTOPATHOLOGIC CHANGES - NEGATIVE FOR ACUTE INFLAMMATION, INCREASED INTRAEPITHELIAL LYMPHOCYTES OR THICKENED SUBEPITHELIAL COLLAGEN TABLE   RUQ [place] 07/30/2018 - normal, no gallstones  CT abdomen / pelvis with contrast 07/10/18 - no acute findings, post gastric bypass anatomy   CBC and CMET normal 01/31/21   Past Medical History:  Diagnosis Date   Allergy    Anemia    Anxiety    Asthma    Depression    GERD (gastroesophageal reflux disease)    Heart murmur    from childhood   Hiatal hernia    Hypertension    Peptic ulcer    PTSD (post-traumatic stress disorder)    UTI (urinary tract infection)      Past Surgical History:  Procedure Laterality Date   ABDOMINAL HYSTERECTOMY     fibroids   BIOPSY  07/11/2018   Procedure: BIOPSY;  Surgeon: [name], MD;  Location: [facility];  Service: Endoscopy;;   COLONOSCOPY  2014   ESOPHAGOGASTRODUODENOSCOPY (EGD) WITH PROPOFOL N/A 07/11/2018   Procedure: ESOPHAGOGASTRODUODENOSCOPY (EGD) WITH PROPOFOL;  Surgeon: [name], MD;  [facility] ENDOSCOPY;  Service: Endoscopy;  Laterality: N/A;   GASTRIC BYPASS  2007   HEMOSTASIS CLIP PLACEMENT  07/11/2018   Procedure: HEMOSTASIS CLIP PLACEMENT;  Surgeon: [name], MD;  Location: [facility];  Service: Endoscopy;;   LAPAROTOMY N/A 03/08/2018   Procedure: EXPLORATORY LAPAROTOMY, REPAIR AND PATCH CLOSURE OF PERFORATED ULCER;  Surgeon: Georganna Skeans, MD;  Location: [facility];  Service: General;  Laterality: N/A;   TUBAL LIGATION     Family History  Problem Relation Age of Onset   Hypertension Father    Cancer Father        Prostate   Depression Sister    Depression Brother    Diabetes Maternal Grandmother    Alzheimer's  disease Maternal Grandmother    Diabetes Maternal Grandfather    Liver disease Maternal Grandfather    Cancer Maternal Grandfather        Prostate   Diabetes Paternal Grandmother    Alzheimer's disease Paternal Grandmother    Diabetes Paternal Grandfather    Cancer Paternal Grandfather        Lung, Prostate   Stroke Mother    Alzheimer's disease Mother    Colon cancer Neg Hx    Esophageal cancer Neg Hx    Pancreatic cancer Neg Hx    Stomach cancer Neg Hx    Colon polyps Neg Hx    Rectal cancer Neg Hx    Social History   Tobacco Use   Smoking status: Former    Types: Cigarettes    Quit date: 01/24/1987    Years since quitting: 34.0   Smokeless tobacco: Never  Vaping Use   Vaping Use: Former   Substances: Flavoring   Devices: has vaped with flavor but no nicotine. Been 8 years  Substance Use Topics   Alcohol use: Not Currently    Alcohol/week: 35.0 standard drinks    Types: 35 Cans of beer per week   Drug use: No   Current Outpatient Medications  Medication Sig Dispense Refill   Acetaminophen (TYLENOL 8 HOUR ARTHRITIS PAIN PO) Take 2 tablets by mouth as needed.     busPIRone (BUSPAR) 30 MG tablet Take 1 tablet (30 mg total) by mouth in the morning and at bedtime. 180 tablet 0   Cholecalciferol (VITAMIN D3) 50 MCG (2000 UT) capsule Take 1 capsule (2,000 Units total) by mouth daily. 90 capsule 0   colestipol (COLESTID) 1 g tablet Take 1 tablet (1 g total) by mouth 2 (two) times daily. 60 tablet 2   COVID-19 mRNA bivalent vaccine, Pfizer, (PFIZER COVID-19 VAC BIVALENT) injection Inject into the muscle. 0.3 mL 0   cyanocobalamin (,VITAMIN B-12,) 1000 MCG/ML injection Inject 1 mL (1,000 mcg total) into the muscle every 30 (thirty) days. 10 mL 0   cyclobenzaprine (FLEXERIL) 10 MG tablet Take 1 tablet (10 mg total) by mouth 3 (three) times daily as needed. for muscle spams 30 tablet 0   dextroamphetamine (DEXTROSTAT) 10 MG tablet Take 2 tablets (20 mg total) by mouth 3 (three)  times daily. 540 tablet 0   diazepam (VALIUM) 5 MG tablet Take 1 tablet (5 mg total) by mouth as needed. 30 tablet 0   Diclofenac Sodium 2 % SOLN Apply 1 pump twice daily as needed. 112 g 1   estradiol (VIVELLE-DOT) 0.05 MG/24HR patch 1 patch 2 (two) times a week.     fluticasone (FLONASE) 50 MCG/ACT nasal spray Place 2 sprays into both nostrils daily. 16 g  5   furosemide (LASIX) 40 MG tablet Take 1 tablet (40 mg total) by mouth daily. 90 tablet 0   Homeopathic Products (ALLERGY MEDICINE PO) Take 1 tablet by mouth as needed (Takes the Clortabs. Equate Brand).     HYDROcodone-acetaminophen (NORCO/VICODIN) 5-325 MG tablet Take 1 tablet by mouth every 6 (six) hours as needed. 15 tablet 0   loperamide (IMODIUM) 2 MG capsule Take 2 mg by mouth daily as needed.      nitrofurantoin, macrocrystal-monohydrate, (MACROBID) 100 MG capsule Take 1 capsule (100 mg total) by mouth 2 (two) times daily. 14 capsule 0   omeprazole (PRILOSEC) 40 MG capsule TAKE 1 CAPSULE BY MOUTH TWICE DAILY. OPEN CAPSULE AND SPRINKLE ON APPLESAUCE. 180 capsule 0   Probiotic Product (ALIGN) CHEW Chew 2 tablets by mouth daily. Align pre and probiotic with vitamain C-take one tablet each Align pre and probiotic with Vitamin b12 for immune and energy-take one tablet each     rOPINIRole (REQUIP) 1 MG tablet TAKE 1 TABLET (1 MG TOTAL) BY MOUTH AT BEDTIME. 90 tablet 0   sertraline (ZOLOFT) 100 MG tablet Take 2 tablets (200 mg total) by mouth daily. 180 tablet 0   SYRINGE-NEEDLE, DISP, 3 ML (B-D 3CC LUER-LOK SYR 25GX5/8") 25G X 5/8" 3 ML MISC USE AS DIRECTED WITH B-12 INJECTION 16 each 0   VITAMIN A PO Take by mouth.     No current facility-administered medications for this visit.   Allergies  Allergen Reactions   Penicillins Other (See Comments)    Did it involve swelling of the face/tongue/throat, SOB, or low BP? Unk Did it involve sudden or severe rash/hives, skin peeling, or any reaction on the inside of your mouth or nose? Unk Did  you need to seek medical attention at a hospital or doctor's office? Unk When did it last happen? Was 56 years old; reaction not recalled     If all above answers are "NO", may proceed with cephalosporin use.    Codeine Itching    Crazy dreams and itching-pt said she can take it with food     Review of Systems: All systems reviewed and negative except where noted in HPI.   Lab Results  Component Value Date   WBC 5.0 01/31/2021   HGB 12.7 01/31/2021   HCT 38.6 01/31/2021   MCV 92.8 01/31/2021   PLT 209.0 01/31/2021    Lab Results  Component Value Date   CREATININE 0.99 01/31/2021   BUN 17 01/31/2021   NA 141 01/31/2021   K 5.3 (H) 01/31/2021   CL 106 01/31/2021   CO2 29 01/31/2021    Lab Results  Component Value Date   ALT 16 01/31/2021   AST 21 01/31/2021   ALKPHOS 66 01/31/2021   BILITOT 0.3 01/31/2021     Physical Exam: BP (!) 150/82    Pulse 97    Ht 5\' 2"  (1.575 m)    Wt 137 lb (62.1 kg)    SpO2 98%    BMI 25.06 kg/m  Constitutional: Pleasant,well-developed, female in no acute distress. Abdominal: Soft, nondistended, nontender.  There are no masses palpable.  Extremities: no edema Neurological: Alert and oriented to person place and time. Skin: Skin is warm and dry. No rashes noted. Psychiatric: Normal mood and affect. Behavior is normal.   ASSESSMENT AND PLAN: 56 year old female here for reassessment of the following:  Right upper quadrant pain Chronic diarrhea History of Roux-en-Y gastric bypass  As above, patient has had history of marginal ulcers,  GI bleeding, abdominal pain, chronic diarrhea since her history of Roux-en-Y gastric bypass.  Her marginal ulcers have intervally healed on higher dose PPI which has been continued.  Main issues are intermittent abdominal pain and chronic loose stools.  Regarding her upper abdominal pain she continues to have postprandial discomfort, more localized to the right upper quadrant at this point in time.  She has  had normal labs recently.  Prior ultrasound ruled out gallstones a few years ago but biliary colic remains possible especially in the setting of gastric bypass.  Recommend HIDA scan to reassess her gallbladder.  In regards to her loose stools, previously had a mildly decreased fecal pancreatic elastase level but trial of pancreatic enzymes did not provide any benefit.  We tried higher dosing of pancreatic enzymes but this was not affordable and she stopped it.  Currently on Imodium, and has been taking Vicodin for her back pain which I feel has probably slow down her bowel as well, so in general a bit improved on this regimen however still having loose stools which can vary with p.o. intake.  I offered to empirically try her on Colestid 1 g twice daily, she has not started it yet but will be picking it up soon.  We will see how she does.  Her colonoscopy was reassuring, negative for microscopic colitis.  She is tested negative for celiac previously.  If her symptoms persist despite this regimen we may repeat her fecal pancreatic elastase to reassess for pancreatic exocrine insufficiency.  She agreed with the plan, a copy of this note will be forwarded to our legal team in regards to her application for disability.  She will follow-up with her primary care in regards to chronic narcotic management/chronic pain management of her back pain which limits her ability to work.  Plan: - HIDA scan - rule out GB dyskinesia - start Colestid 1gm BID - in pharmacy, needs to pick up - continue immodium PRN for loose stools - may repeat fecal pancreatic elastase pending course if symptoms persist - continue omeprazole, no NSAIDs - copy of this note to be sent to her legal team   Jolly Mango, MD [facility]

## 2021-02-18 ENCOUNTER — Telehealth: Payer: Self-pay | Admitting: Family Medicine

## 2021-02-18 NOTE — Telephone Encounter (Signed)
Patient states her disability letter needs to be faxed to [phone] Attn: Raquel Sarna. Then she would like a copy went to [email or web address] and mailed to her address.  Please advise.

## 2021-02-21 NOTE — Telephone Encounter (Signed)
Letter faxed, emailed, and placed in mail

## 2021-02-28 ENCOUNTER — Ambulatory Visit (HOSPITAL_COMMUNITY)
Admission: RE | Admit: 2021-02-28 | Discharge: 2021-02-28 | Disposition: A | Discharge status: Home / Self Care | Payer: 59 | Source: Ambulatory Visit | Attending: Gastroenterology | Admitting: Gastroenterology

## 2021-02-28 ENCOUNTER — Other Ambulatory Visit: Payer: Self-pay

## 2021-02-28 DIAGNOSIS — R1011 Right upper quadrant pain: Secondary | ICD-10-CM

## 2021-02-28 DIAGNOSIS — K529 Noninfective gastroenteritis and colitis, unspecified: Secondary | ICD-10-CM | POA: Diagnosis present

## 2021-02-28 DIAGNOSIS — Z9884 Bariatric surgery status: Secondary | ICD-10-CM | POA: Diagnosis present

## 2021-02-28 MED ORDER — TECHNETIUM TC 99M MEBROFENIN IV KIT
5.2000 | PACK | Freq: Once | INTRAVENOUS | Status: AC | PRN
  Administered 2021-02-28: 5.2 via INTRAVENOUS

## 2021-03-10 ENCOUNTER — Other Ambulatory Visit: Payer: Self-pay

## 2021-03-10 ENCOUNTER — Encounter: Payer: Self-pay | Admitting: Urology

## 2021-03-10 ENCOUNTER — Ambulatory Visit: Payer: 59 | Admitting: Urology

## 2021-03-10 VITALS — BP 125/70 | HR 79 | Ht 62.0 in | Wt 135.0 lb

## 2021-03-10 DIAGNOSIS — M545 Low back pain, unspecified: Secondary | ICD-10-CM

## 2021-03-10 DIAGNOSIS — R300 Dysuria: Secondary | ICD-10-CM

## 2021-03-10 DIAGNOSIS — G8929 Other chronic pain: Secondary | ICD-10-CM

## 2021-03-10 DIAGNOSIS — R339 Retention of urine, unspecified: Secondary | ICD-10-CM

## 2021-03-10 LAB — BLADDER SCAN AMB NON-IMAGING: Scan Result: >441

## 2021-03-10 LAB — URINALYSIS, COMPLETE
Bilirubin, UA: NEGATIVE
Glucose, UA: NEGATIVE
Ketones, UA: NEGATIVE
Nitrite, UA: NEGATIVE
Protein,UA: NEGATIVE
RBC, UA: NEGATIVE
Specific Gravity, UA: 1.015 (ref 1.005–1.030)
Urobilinogen, Ur: 0.2 mg/dL (ref 0.2–1.0)
pH, UA: 6 (ref 5.0–7.5)

## 2021-03-10 LAB — MICROSCOPIC EXAMINATION: RBC, Urine: NONE SEEN /hpf (ref 0–2)

## 2021-03-10 MED ORDER — ESTRADIOL 0.1 MG/GM VA CREA: TOPICAL_CREAM | VAGINAL | 0 refills | Status: DC

## 2021-03-10 NOTE — Patient Instructions (Signed)
Take over the counter D-Mannose and Cranberry tablets daily 

## 2021-03-10 NOTE — Progress Notes (Signed)
03/10/2021 11:05 AM   [name] 03-May-1965 [id number]  Referring provider: Carollee Herter, Alferd Apa, DO [address] [place],  [place] [id number]  Chief Complaint  Patient presents with   Dysuria    HPI: [name] is a 56 y.o. female referred for recurrent UTI and voiding problems.  For the past few months has noted intermittent dysuria, urinary frequency/urgency with voiding small amounts, bilateral low back pain and lower abdominal discomfort Estimates she has been treated for UTIs x2 over the last 2 months Urine culture 01/31/2021 grew 50-100 K E. coli and was treated with nitrofurantoin Denies gross hematuria or febrile UTI   PMH: Past Medical History:  Diagnosis Date   Allergy    Anemia    Anxiety    Asthma    Depression    GERD (gastroesophageal reflux disease)    Heart murmur    from childhood   Hiatal hernia    Hypertension    Peptic ulcer    PTSD (post-traumatic stress disorder)    UTI (urinary tract infection)     Surgical History: Past Surgical History:  Procedure Laterality Date   ABDOMINAL HYSTERECTOMY     fibroids   BIOPSY  07/11/2018   Procedure: BIOPSY;  Surgeon: [name], MD;  Location: [facility];  Service: Endoscopy;;   COLONOSCOPY  2014   ESOPHAGOGASTRODUODENOSCOPY (EGD) WITH PROPOFOL N/A 07/11/2018   Procedure: ESOPHAGOGASTRODUODENOSCOPY (EGD) WITH PROPOFOL;  Surgeon: [name], MD;  Location: [facility];  Service: Endoscopy;  Laterality: N/A;   GASTRIC BYPASS  2007   HEMOSTASIS CLIP PLACEMENT  07/11/2018   Procedure: HEMOSTASIS CLIP PLACEMENT;  Surgeon: [name], MD;  Location: [facility];  Service: Endoscopy;;   LAPAROTOMY N/A 03/08/2018   Procedure: EXPLORATORY LAPAROTOMY, REPAIR AND PATCH CLOSURE OF PERFORATED ULCER;  Surgeon: Georganna Skeans, MD;  Location: [facility];  Service: General;  Laterality: N/A;   TUBAL LIGATION      Home Medications:  Allergies as of 03/10/2021       Reactions    Penicillins Other (See Comments)   Did it involve swelling of the face/tongue/throat, SOB, or low BP? Unk Did it involve sudden or severe rash/hives, skin peeling, or any reaction on the inside of your mouth or nose? Unk Did you need to seek medical attention at a hospital or doctor's office? Unk When did it last happen? Was 56 years old; reaction not recalled     If all above answers are "NO", may proceed with cephalosporin use.   Codeine Itching   Crazy dreams and itching-pt said she can take it with food        Medication List        Accurate as of March 10, 2021 11:05 AM. If you have any questions, ask your nurse or doctor.          STOP taking these medications    nitrofurantoin (macrocrystal-monohydrate) 100 MG capsule Commonly known as: Macrobid Stopped by: Abbie Sons, MD       TAKE these medications    Align Chew Chew 2 tablets by mouth daily. Align pre and probiotic with vitamain C-take one tablet each Align pre and probiotic with Vitamin b12 for immune and energy-take one tablet each   ALLERGY MEDICINE PO Take 1 tablet by mouth as needed (Takes the Clortabs. Equate Brand).   B-D 3CC LUER-LOK SYR 25GX5/8" 25G X 5/8" 3 ML Misc Generic drug: SYRINGE-NEEDLE (DISP) 3 ML USE AS DIRECTED  WITH B-12 INJECTION   busPIRone 30 MG tablet Commonly known as: BUSPAR Take 1 tablet (30 mg total) by mouth in the morning and at bedtime.   colestipol 1 g tablet Commonly known as: COLESTID Take 1 tablet (1 g total) by mouth 2 (two) times daily.   cyanocobalamin 1000 MCG/ML injection Commonly known as: (VITAMIN B-12) Inject 1 mL (1,000 mcg total) into the muscle every 30 (thirty) days.   cyclobenzaprine 10 MG tablet Commonly known as: FLEXERIL Take 1 tablet (10 mg total) by mouth 3 (three) times daily as needed. for muscle spams   dextroamphetamine 10 MG tablet Commonly known as: DEXTROSTAT Take 2 tablets (20 mg total) by mouth 3 (three) times daily.    diazepam 5 MG tablet Commonly known as: VALIUM Take 1 tablet (5 mg total) by mouth as needed.   Diclofenac Sodium 2 % Soln Apply 1 pump twice daily as needed.   estradiol 0.05 MG/24HR patch Commonly known as: VIVELLE-DOT 1 patch 2 (two) times a week.   fluticasone 50 MCG/ACT nasal spray Commonly known as: FLONASE Place 2 sprays into both nostrils daily.   furosemide 40 MG tablet Commonly known as: LASIX Take 1 tablet (40 mg total) by mouth daily.   HYDROcodone-acetaminophen 5-325 MG tablet Commonly known as: NORCO/VICODIN Take 1 tablet by mouth every 6 (six) hours as needed.   loperamide 2 MG capsule Commonly known as: IMODIUM Take 2 mg by mouth daily as needed.   omeprazole 40 MG capsule Commonly known as: PRILOSEC TAKE 1 CAPSULE BY MOUTH TWICE DAILY. OPEN CAPSULE AND SPRINKLE ON APPLESAUCE.   Pfizer COVID-19 Vac Bivalent injection Generic drug: COVID-19 mRNA bivalent vaccine Therapist, music) Inject into the muscle.   rOPINIRole 1 MG tablet Commonly known as: REQUIP TAKE 1 TABLET (1 MG TOTAL) BY MOUTH AT BEDTIME.   sertraline 100 MG tablet Commonly known as: ZOLOFT Take 2 tablets (200 mg total) by mouth daily.   TYLENOL 8 HOUR ARTHRITIS PAIN PO Take 2 tablets by mouth as needed.   VITAMIN A PO Take by mouth.   Vitamin D3 50 MCG (2000 UT) capsule Take 1 capsule (2,000 Units total) by mouth daily.        Allergies:  Allergies  Allergen Reactions   Penicillins Other (See Comments)    Did it involve swelling of the face/tongue/throat, SOB, or low BP? Unk Did it involve sudden or severe rash/hives, skin peeling, or any reaction on the inside of your mouth or nose? Unk Did you need to seek medical attention at a hospital or doctor's office? Unk When did it last happen? Was 56 years old; reaction not recalled     If all above answers are "NO", may proceed with cephalosporin use.    Codeine Itching    Crazy dreams and itching-pt said she can take it with food     Family History: Family History  Problem Relation Age of Onset   Hypertension Father    Cancer Father        Prostate   Depression Sister    Depression Brother    Diabetes Maternal Grandmother    Alzheimer's disease Maternal Grandmother    Diabetes Maternal Grandfather    Liver disease Maternal Grandfather    Cancer Maternal Grandfather        Prostate   Diabetes Paternal Grandmother    Alzheimer's disease Paternal Grandmother    Diabetes Paternal Grandfather    Cancer Paternal Grandfather        Lung, Prostate  Stroke Mother    Alzheimer's disease Mother    Colon cancer Neg Hx    Esophageal cancer Neg Hx    Pancreatic cancer Neg Hx    Stomach cancer Neg Hx    Colon polyps Neg Hx    Rectal cancer Neg Hx     Social History:  reports that she quit smoking about 34 years ago. Her smoking use included cigarettes. She has never used smokeless tobacco. She reports that she does not currently use alcohol after a past usage of about 35.0 standard drinks per week. She reports that she does not use drugs.   Physical Exam: BP 125/70    Pulse 79    Ht 5\' 2"  (1.575 m)    Wt 135 lb (61.2 kg)    BMI 24.69 kg/m   Constitutional:  Alert and oriented, No acute distress. HEENT: [place] AT, moist mucus membranes.  Trachea midline, no masses. Cardiovascular: No clubbing, cyanosis, or edema. Respiratory: Normal respiratory effort, no increased work of breathing. Psychiatric: Normal mood and affect.  Laboratory Data:  Urinalysis Nitrite -; 6-10 WBC  Assessment & Plan:    1. Dysuria with recent UTI Mild pyuria; urine culture repeated She is perimenopausal and states she has been given a prescription for an estrogen patch however is unable to afford at this time.  Discussed low-dose vaginal estrogen and Rx Estrace sent to pharmacy We is also discussed supplements that may be helpful in preventing urinary tract infections including cranberry tablets and D-mannose  2.  Low back pain Most  likely musculoskeletal in etiology; renal ultrasound ordered and will call with results  3.  Pelvic pain Bladder scan PVR ~ 400 mL which may be playing a factor in her recurrent infections Renal ultrasound as above Follow-up after renal ultrasound for symptom recheck, repeat PVR and pelvic exam    Abbie Sons, MD  [facility] [address] [place], [place] [address] [phone]

## 2021-03-16 ENCOUNTER — Encounter: Payer: Self-pay | Admitting: *Deleted

## 2021-03-16 ENCOUNTER — Other Ambulatory Visit: Payer: Self-pay | Admitting: *Deleted

## 2021-03-16 LAB — CULTURE, URINE COMPREHENSIVE

## 2021-03-16 MED ORDER — SULFAMETHOXAZOLE-TRIMETHOPRIM 800-160 MG PO TABS: 1.0000 | ORAL_TABLET | Freq: Two times a day (BID) | ORAL | 0 refills | Status: AC

## 2021-03-16 NOTE — Telephone Encounter (Signed)
Left message and sent my chart message  

## 2021-03-17 ENCOUNTER — Other Ambulatory Visit (HOSPITAL_BASED_OUTPATIENT_CLINIC_OR_DEPARTMENT_OTHER): Payer: Self-pay

## 2021-03-17 ENCOUNTER — Telehealth: Payer: Self-pay | Admitting: Urology

## 2021-03-17 ENCOUNTER — Other Ambulatory Visit: Payer: Self-pay | Admitting: Family Medicine

## 2021-03-17 DIAGNOSIS — R41840 Attention and concentration deficit: Secondary | ICD-10-CM

## 2021-03-17 DIAGNOSIS — M79604 Pain in right leg: Secondary | ICD-10-CM

## 2021-03-17 DIAGNOSIS — F419 Anxiety disorder, unspecified: Secondary | ICD-10-CM

## 2021-03-17 DIAGNOSIS — M79605 Pain in left leg: Secondary | ICD-10-CM

## 2021-03-17 DIAGNOSIS — M545 Low back pain, unspecified: Secondary | ICD-10-CM

## 2021-03-17 DIAGNOSIS — E538 Deficiency of other specified B group vitamins: Secondary | ICD-10-CM

## 2021-03-17 MED ORDER — DEXTROAMPHETAMINE SULFATE 10 MG PO TABS: 20.0000 mg | ORAL_TABLET | Freq: Three times a day (TID) | ORAL | 0 refills | Status: DC

## 2021-03-17 MED ORDER — SERTRALINE HCL 100 MG PO TABS
200.0000 mg | ORAL_TABLET | Freq: Every day | ORAL | 0 refills | Status: DC
  Filled 2021-03-17: qty 180, 90d supply, fill #0

## 2021-03-17 MED ORDER — CYANOCOBALAMIN 1000 MCG/ML IJ SOLN
1000.0000 ug | INTRAMUSCULAR | 0 refills | Status: DC
  Filled 2021-03-17: qty 1, 30d supply, fill #0
  Filled 2021-07-14: qty 1, 30d supply, fill #1
  Filled 2021-10-13: qty 1, 30d supply, fill #2
  Filled 2021-11-17: qty 1, 30d supply, fill #3
  Filled 2021-12-15: qty 1, 30d supply, fill #4
  Filled 2022-01-20: qty 1, 30d supply, fill #5
  Filled 2022-02-20: qty 1, 30d supply, fill #6

## 2021-03-17 MED ORDER — FUROSEMIDE 40 MG PO TABS
40.0000 mg | ORAL_TABLET | Freq: Every day | ORAL | 0 refills | Status: DC
  Filled 2021-03-17: qty 90, 90d supply, fill #0

## 2021-03-17 MED ORDER — OMEPRAZOLE 40 MG PO CPDR
DELAYED_RELEASE_CAPSULE | ORAL | 0 refills | Status: DC
  Filled 2021-03-17: qty 180, 90d supply, fill #0

## 2021-03-17 MED ORDER — CYCLOBENZAPRINE HCL 10 MG PO TABS
10.0000 mg | ORAL_TABLET | Freq: Three times a day (TID) | ORAL | 0 refills | Status: DC | PRN
  Filled 2021-03-17: qty 30, 10d supply, fill #0

## 2021-03-17 MED ORDER — "BD LUER-LOK SYRINGE 25G X 5/8"" 3 ML MISC"
0 refills | Status: DC
  Filled 2021-03-17: qty 16, 84d supply, fill #0

## 2021-03-17 MED ORDER — BUSPIRONE HCL 30 MG PO TABS
30.0000 mg | ORAL_TABLET | Freq: Two times a day (BID) | ORAL | 0 refills | Status: DC
  Filled 2021-03-17: qty 180, 90d supply, fill #0

## 2021-03-17 NOTE — Telephone Encounter (Signed)
Pt would like all medications that Dr. Etter Sjogren prescribe sent downstairs.   Please advise.

## 2021-03-17 NOTE — Telephone Encounter (Signed)
Requesting: Dextroamphetamine Contract: UDS: Last OV: 01/31/2021 Next OV: 08/01/2021 Last Refill: 01/27/2021, #[id number] RF Database:   Please advise

## 2021-03-17 NOTE — Telephone Encounter (Signed)
Patient received the message about a positive urine culture with low e-coli.  She picked up the antibiotic, but not sure if she should take it.  Patient states that when she was providing the urine specimen in the clinic, she was having a flare of her IBS.  She believes that she may have gotten some stool in her specimen cup.  She washed out the cup and then filled cup with urine specimen.    She states that she is not having any symptoms of UTI so not sure if she should take the antibiotics.  Should we have her come in for a repeat UA/Culture?  Please advise.

## 2021-03-17 NOTE — Telephone Encounter (Signed)
Advised patient to take the antibiotic that Dr. Bernardo Heater sent in . Patient states she will take the medication . We dont need a urine sample right now.

## 2021-03-18 ENCOUNTER — Telehealth: Payer: Self-pay | Admitting: Family Medicine

## 2021-03-18 ENCOUNTER — Other Ambulatory Visit: Payer: Self-pay | Admitting: Family Medicine

## 2021-03-18 ENCOUNTER — Other Ambulatory Visit (HOSPITAL_BASED_OUTPATIENT_CLINIC_OR_DEPARTMENT_OTHER): Payer: Self-pay

## 2021-03-18 ENCOUNTER — Telehealth: Payer: Self-pay

## 2021-03-18 DIAGNOSIS — N760 Acute vaginitis: Secondary | ICD-10-CM

## 2021-03-18 DIAGNOSIS — R41840 Attention and concentration deficit: Secondary | ICD-10-CM

## 2021-03-18 DIAGNOSIS — G2581 Restless legs syndrome: Secondary | ICD-10-CM

## 2021-03-18 DIAGNOSIS — E559 Vitamin D deficiency, unspecified: Secondary | ICD-10-CM

## 2021-03-18 MED ORDER — DEXTROAMPHETAMINE SULFATE 10 MG PO TABS
20.0000 mg | ORAL_TABLET | Freq: Three times a day (TID) | ORAL | 0 refills | Status: DC
  Filled 2021-03-18 (×2): qty 540, 90d supply, fill #0

## 2021-03-18 MED ORDER — VITAMIN D3 50 MCG (2000 UT) PO CAPS
2000.0000 [IU] | ORAL_CAPSULE | Freq: Every day | ORAL | 0 refills | Status: AC
  Filled 2021-03-18: qty 100, 100d supply, fill #0

## 2021-03-18 MED ORDER — ESTRADIOL 0.1 MG/24HR TD PTTW
1.0000 | MEDICATED_PATCH | TRANSDERMAL | 12 refills | Status: DC
Start: 2021-03-21 — End: 2021-05-25
  Filled 2021-03-18: qty 8, 28d supply, fill #0

## 2021-03-18 MED ORDER — FLUCONAZOLE 150 MG PO TABS
ORAL_TABLET | ORAL | 0 refills | Status: DC
  Filled 2021-03-18: qty 2, 4d supply, fill #0
  Filled 2021-10-26: qty 2, 3d supply, fill #0

## 2021-03-18 MED ORDER — ROPINIROLE HCL 1 MG PO TABS
ORAL_TABLET | ORAL | 0 refills | Status: DC
  Filled 2021-03-18: qty 90, 90d supply, fill #0

## 2021-03-18 MED ORDER — FLUCONAZOLE 150 MG PO TABS: ORAL_TABLET | ORAL | 0 refills | Status: DC

## 2021-03-18 MED ORDER — ESTRADIOL 0.05 MG/24HR TD PTTW
1.0000 | MEDICATED_PATCH | TRANSDERMAL | 11 refills | Status: DC
  Filled 2021-03-18: qty 8, 28d supply, fill #0

## 2021-03-18 NOTE — Telephone Encounter (Signed)
Pt called inquiring updates on rx. Please advise.

## 2021-03-18 NOTE — Telephone Encounter (Signed)
Please advise regarding Estradiol.

## 2021-03-18 NOTE — Telephone Encounter (Signed)
Noted  

## 2021-03-18 NOTE — Telephone Encounter (Signed)
PA initiated via Covermymeds; KEY:  [id number]. Awaiting determination.

## 2021-03-18 NOTE — Telephone Encounter (Signed)
Pt states insurance change and needs [facility] to prescribe Estradiol that was originally prescribed by the ED.   Pt states they upped her dose to 1 MG for the patches.   Pt would like yeast infection medication prescribed as well.   Please advise.

## 2021-03-18 NOTE — Telephone Encounter (Signed)
PA approved.   PA Case: [id number], Status: Approved, Coverage Starts on: 03/18/2021 12:00 AM, Coverage Ends on: 03/18/2022 12:00 AM. Questions? Contact [phone].

## 2021-03-18 NOTE — Telephone Encounter (Signed)
Pt advised rx sent to [facility].

## 2021-03-18 NOTE — Telephone Encounter (Signed)
Not sure if this refill is appropriate. Please advise, thanks. °

## 2021-03-21 ENCOUNTER — Other Ambulatory Visit: Payer: Self-pay | Admitting: Family Medicine

## 2021-03-21 ENCOUNTER — Other Ambulatory Visit (HOSPITAL_BASED_OUTPATIENT_CLINIC_OR_DEPARTMENT_OTHER): Payer: Self-pay

## 2021-03-21 DIAGNOSIS — F988 Other specified behavioral and emotional disorders with onset usually occurring in childhood and adolescence: Secondary | ICD-10-CM

## 2021-03-21 MED ORDER — AMPHETAMINE-DEXTROAMPHETAMINE 20 MG PO TABS
ORAL_TABLET | ORAL | 0 refills | Status: DC
  Filled 2021-03-21: qty 90, 30d supply, fill #0

## 2021-03-22 ENCOUNTER — Other Ambulatory Visit (HOSPITAL_BASED_OUTPATIENT_CLINIC_OR_DEPARTMENT_OTHER): Payer: Self-pay

## 2021-03-22 ENCOUNTER — Other Ambulatory Visit: Payer: Self-pay | Admitting: Gastroenterology

## 2021-03-22 MED ORDER — COLESTIPOL HCL 1 G PO TABS
1.0000 g | ORAL_TABLET | Freq: Two times a day (BID) | ORAL | 2 refills | Status: DC
  Filled 2021-03-22: qty 60, 30d supply, fill #0
  Filled 2021-05-18: qty 60, 30d supply, fill #1
  Filled 2021-07-14 – 2021-10-13 (×3): qty 60, 30d supply, fill #2

## 2021-03-24 ENCOUNTER — Ambulatory Visit
Admission: RE | Admit: 2021-03-24 | Discharge: 2021-03-24 | Disposition: A | Discharge status: Home / Self Care | Payer: 59 | Source: Ambulatory Visit | Attending: Urology | Admitting: Urology

## 2021-03-24 ENCOUNTER — Other Ambulatory Visit: Payer: Self-pay

## 2021-03-24 DIAGNOSIS — M545 Low back pain, unspecified: Secondary | ICD-10-CM | POA: Diagnosis present

## 2021-03-24 DIAGNOSIS — R300 Dysuria: Secondary | ICD-10-CM | POA: Diagnosis present

## 2021-03-24 DIAGNOSIS — G8929 Other chronic pain: Secondary | ICD-10-CM | POA: Diagnosis present

## 2021-03-24 DIAGNOSIS — R339 Retention of urine, unspecified: Secondary | ICD-10-CM | POA: Insufficient documentation

## 2021-03-28 ENCOUNTER — Encounter: Payer: Self-pay | Admitting: *Deleted

## 2021-03-28 ENCOUNTER — Other Ambulatory Visit (HOSPITAL_BASED_OUTPATIENT_CLINIC_OR_DEPARTMENT_OTHER): Payer: Self-pay

## 2021-03-30 ENCOUNTER — Other Ambulatory Visit (HOSPITAL_BASED_OUTPATIENT_CLINIC_OR_DEPARTMENT_OTHER): Payer: Self-pay

## 2021-03-31 ENCOUNTER — Other Ambulatory Visit (HOSPITAL_BASED_OUTPATIENT_CLINIC_OR_DEPARTMENT_OTHER): Payer: Self-pay

## 2021-04-04 ENCOUNTER — Other Ambulatory Visit (HOSPITAL_BASED_OUTPATIENT_CLINIC_OR_DEPARTMENT_OTHER): Payer: Self-pay

## 2021-04-04 ENCOUNTER — Ambulatory Visit (INDEPENDENT_AMBULATORY_CARE_PROVIDER_SITE_OTHER): Payer: Self-pay | Admitting: Psychiatry

## 2021-04-04 ENCOUNTER — Other Ambulatory Visit: Payer: Self-pay

## 2021-04-04 ENCOUNTER — Encounter: Payer: Self-pay | Admitting: Psychiatry

## 2021-04-04 VITALS — BP 126/83 | HR 76 | Temp 98.1°F | Wt 137.4 lb

## 2021-04-04 DIAGNOSIS — F4310 Post-traumatic stress disorder, unspecified: Secondary | ICD-10-CM | POA: Insufficient documentation

## 2021-04-04 DIAGNOSIS — F4010 Social phobia, unspecified: Secondary | ICD-10-CM | POA: Insufficient documentation

## 2021-04-04 DIAGNOSIS — F1021 Alcohol dependence, in remission: Secondary | ICD-10-CM

## 2021-04-04 DIAGNOSIS — R41840 Attention and concentration deficit: Secondary | ICD-10-CM

## 2021-04-04 DIAGNOSIS — F331 Major depressive disorder, recurrent, moderate: Secondary | ICD-10-CM | POA: Insufficient documentation

## 2021-04-04 MED ORDER — HYDROXYZINE PAMOATE 25 MG PO CAPS
25.0000 mg | ORAL_CAPSULE | Freq: Two times a day (BID) | ORAL | 1 refills | Status: DC | PRN
  Filled 2021-04-04: qty 60, 30d supply, fill #0
  Filled 2021-06-29: qty 60, 30d supply, fill #1

## 2021-04-04 MED ORDER — ESCITALOPRAM OXALATE 10 MG PO TABS
5.0000 mg | ORAL_TABLET | Freq: Every day | ORAL | 1 refills | Status: DC
  Filled 2021-04-04: qty 30, 30d supply, fill #0
  Filled 2021-05-18: qty 30, 30d supply, fill #1

## 2021-04-04 MED ORDER — SERTRALINE HCL 25 MG PO TABS
25.0000 mg | ORAL_TABLET | ORAL | 0 refills | Status: DC
  Filled 2021-04-04: qty 15, 15d supply, fill #0
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
## 2021-04-04 NOTE — Patient Instructions (Addendum)
Hydroxyzine Capsules or Tablets What is this medication? HYDROXYZINE (hye Chalfant i zeen) treats the symptoms of allergies and allergic reactions. It may also be used to treat anxiety or cause drowsiness before a procedure. It works by blocking histamine, a substance released by the body during an allergic reaction. It belongs to a group of medications called antihistamines. This medicine may be used for other purposes; ask your health care provider or pharmacist if you have questions. COMMON BRAND NAME(S): ANX, Atarax, Rezine, Vistaril What should I tell my care team before I take this medication? They need to know if you have any of these conditions: Glaucoma Heart disease History of irregular heartbeat Kidney disease Liver disease Lung or breathing disease, like asthma Stomach or intestine problems Thyroid disease Trouble passing urine An unusual or allergic reaction to hydroxyzine, cetirizine, other medications, foods, dyes or preservatives Pregnant or trying to get pregnant Breast-feeding How should I use this medication? Take this medication by mouth with a full glass of water. Follow the directions on the prescription label. You may take this medication with food or on an empty stomach. Take your medication at regular intervals. Do not take your medication more often than directed. Talk to your care team regarding the use of this medication in children. Special care may be needed. While this medication may be prescribed for children as young as 1 years of age for selected conditions, precautions do apply. Patients over 15 years old may have a stronger reaction and need a smaller dose. Overdosage: If you think you have taken too much of this medicine contact a poison control center or emergency room at once. NOTE: This medicine is only for you. Do not share this medicine with others. What if I miss a dose? If you miss a dose, take it as soon as you can. If it is almost time for your next  dose, take only that dose. Do not take double or extra doses. What may interact with this medication? Do not take this medication with any of the following: Cisapride Dronedarone Pimozide Thioridazine This medication may also interact with the following: Alcohol Antihistamines for allergy, cough, and cold Atropine Barbiturate medications for sleep or seizures, like phenobarbital Certain antibiotics like erythromycin or clarithromycin Certain medications for anxiety or sleep Certain medications for bladder problems like oxybutynin, tolterodine Certain medications for depression or psychotic disturbances Certain medications for irregular heart beat Certain medications for Parkinson's disease like benztropine, trihexyphenidyl Certain medications for seizures like phenobarbital, primidone Certain medications for stomach problems like dicyclomine, hyoscyamine Certain medications for travel sickness like scopolamine Ipratropium Narcotic medications for pain Other medications that prolong the QT interval (which can cause an abnormal heart rhythm) like dofetilide This list may not describe all possible interactions. Give your health care provider a list of all the medicines, herbs, non-prescription drugs, or dietary supplements you use. Also tell them if you smoke, drink alcohol, or use illegal drugs. Some items may interact with your medicine. What should I watch for while using this medication? Tell your care team if your symptoms do not improve. You may get drowsy or dizzy. Do not drive, use machinery, or do anything that needs mental alertness until you know how this medication affects you. Do not stand or sit up quickly, especially if you are an older patient. This reduces the risk of dizzy or fainting spells. Alcohol may interfere with the effect of this medication. Avoid alcoholic drinks. Your mouth may get dry. Chewing sugarless gum or sucking hard  candy, and drinking plenty of water may  help. Contact your care team if the problem does not go away or is severe. This medication may cause dry eyes and blurred vision. If you wear contact lenses you may feel some discomfort. Lubricating drops may help. See your eye care specialist if the problem does not go away or is severe. If you are receiving skin tests for allergies, tell your care team you are using this medication. What side effects may I notice from receiving this medication? Side effects that you should report to your care team as soon as possible: Allergic reactions--skin rash, itching, hives, swelling of the face, lips, tongue, or throat Heart rhythm changes--fast or irregular heartbeat, dizziness, feeling faint or lightheaded, chest pain, trouble breathing Side effects that usually do not require medical attention (report to your care team if they continue or are bothersome): Confusion Drowsiness Dry mouth Hallucinations Headache This list may not describe all possible side effects. Call your doctor for medical advice about side effects. You may report side effects to FDA at 1-800-FDA-1088. Where should I keep my medication? Keep out of the reach of children and pets. Store at room temperature between 15 and 30 degrees C (59 and 86 degrees F). Keep container tightly closed. Throw away any unused medication after the expiration date. NOTE: This sheet is a summary. It may not cover all possible information. If you have questions about this medicine, talk to your doctor, pharmacist, or health care provider.  2022 [employer]/Gold Standard (2020-03-24 00:00:00) Escitalopram Tablets What is this medication? ESCITALOPRAM (es sye TAL oh pram) treats depression and anxiety. It increases the amount of serotonin in the brain, a hormone that helps regulate mood. It belongs to a group of medications called SSRIs. This medicine may be used for other purposes; ask your health care provider or pharmacist if you have questions. COMMON  BRAND NAME(S): Lexapro What should I tell my care team before I take this medication? They need to know if you have any of these conditions: Bipolar disorder or a family history of bipolar disorder Diabetes Glaucoma Heart disease Kidney or liver disease Receiving electroconvulsive therapy Seizures Suicidal thoughts, plans, or attempt by you or a family member An unusual or allergic reaction to escitalopram, the related medication citalopram, other medications, foods, dyes, or preservatives Pregnant or trying to become pregnant Breast-feeding How should I use this medication? Take this medication by mouth with a glass of water. Follow the directions on the prescription label. You can take it with or without food. If it upsets your stomach, take it with food. Take your medication at regular intervals. Do not take it more often than directed. Do not stop taking this medication suddenly except upon the advice of your care team. Stopping this medication too quickly may cause serious side effects or your condition may worsen. A special MedGuide will be given to you by the pharmacist with each prescription and refill. Be sure to read this information carefully each time. Talk to your care team regarding the use of this medication in children. Special care may be needed. Overdosage: If you think you have taken too much of this medicine contact a poison control center or emergency room at once. NOTE: This medicine is only for you. Do not share this medicine with others. What if I miss a dose? If you miss a dose, take it as soon as you can. If it is almost time for your next dose, take only that dose. Do not  take double or extra doses. What may interact with this medication? Do not take this medication with any of the following: Certain medications for fungal infections like fluconazole, itraconazole, ketoconazole, posaconazole, voriconazole Cisapride Citalopram Dronedarone Linezolid MAOIs like  Carbex, Eldepryl, Marplan, Nardil, and Parnate Methylene blue (injected into a vein) Pimozide Thioridazine This medication may also interact with the following: Alcohol Amphetamines Aspirin and aspirin-like medications Carbamazepine Certain medications for depression, anxiety, or psychotic disturbances Certain medications for migraine headache like almotriptan, eletriptan, frovatriptan, naratriptan, rizatriptan, sumatriptan, zolmitriptan Certain medications for sleep Certain medications that treat or prevent blood clots like warfarin, enoxaparin, dalteparin Cimetidine Diuretics Dofetilide Fentanyl Furazolidone Isoniazid Lithium Metoprolol NSAIDs, medications for pain and inflammation, like ibuprofen or naproxen Other medications that prolong the QT interval (cause an abnormal heart rhythm) Procarbazine Rasagiline Supplements like St. John's wort, kava kava, valerian Tramadol Tryptophan Ziprasidone This list may not describe all possible interactions. Give your health care provider a list of all the medicines, herbs, non-prescription drugs, or dietary supplements you use. Also tell them if you smoke, drink alcohol, or use illegal drugs. Some items may interact with your medicine. What should I watch for while using this medication? Tell your care team if your symptoms do not get better or if they get worse. Visit your care team for regular checks on your progress. Because it may take several weeks to see the full effects of this medication, it is important to continue your treatment as prescribed by your care team. Watch for new or worsening thoughts of suicide or depression. This includes sudden changes in mood, behaviors, or thoughts. These changes can happen at any time but are more common in the beginning of treatment or after a change in dose. Call your care team right away if you experience these thoughts or worsening depression. Manic episodes may happen in patients with  bipolar disorder who take this medication. Watch for changes in feelings or behaviors such as feeling anxious, nervous, agitated, panicky, irritable, hostile, aggressive, impulsive, severely restless, overly excited and hyperactive, or trouble sleeping. These symptoms can happen at any time but are more common in the beginning of treatment or after a change in dose. Call your care team right away if you notice any of these symptoms. You may get drowsy or dizzy. Do not drive, use machinery, or do anything that needs mental alertness until you know how this medication affects you. Do not stand or sit up quickly, especially if you are an older patient. This reduces the risk of dizzy or fainting spells. Alcohol may interfere with the effect of this medication. Avoid alcoholic drinks. Your mouth may get dry. Chewing sugarless gum or sucking hard candy, and drinking plenty of water may help. Contact your care team if the problem does not go away or is severe. What side effects may I notice from receiving this medication? Side effects that you should report to your care team as soon as possible: Allergic reactions--skin rash, itching, hives, swelling of the face, lips, tongue, or throat Bleeding--bloody or black, tar-like stools, red or dark brown urine, vomiting blood or brown material that looks like coffee grounds, small, red or purple spots on skin, unusual bleeding or bruising Heart rhythm changes--fast or irregular heartbeat, dizziness, feeling faint or lightheaded, chest pain, trouble breathing Low sodium level--muscle weakness, fatigue, dizziness, headache, confusion Serotonin syndrome--irritability, confusion, fast or irregular heartbeat, muscle stiffness, twitching muscles, sweating, high fever, seizure, chills, vomiting, diarrhea Sudden eye pain or change in vision such as blurry  vision, seeing halos around lights, vision loss Thoughts of suicide or self-harm, worsening mood, feelings of  depression Side effects that usually do not require medical attention (report to your care team if they continue or are bothersome): Change in sex drive or performance Diarrhea Excessive sweating Nausea Tremors or shaking Upset stomach This list may not describe all possible side effects. Call your doctor for medical advice about side effects. You may report side effects to FDA at 1-800-FDA-1088. Where should I keep my medication? Keep out of reach of children and pets. Store at room temperature between 15 and 30 degrees C (59 and 86 degrees F). Throw away any unused medication after the expiration date. NOTE: This sheet is a summary. It may not cover all possible information. If you have questions about this medicine, talk to your doctor, pharmacist, or health care provider.  2022 [employer]/Gold Standard (2019-12-29 00:00:00)

## 2021-04-08 ENCOUNTER — Other Ambulatory Visit (HOSPITAL_BASED_OUTPATIENT_CLINIC_OR_DEPARTMENT_OTHER): Payer: Self-pay

## 2021-04-11 ENCOUNTER — Other Ambulatory Visit: Payer: Self-pay

## 2021-04-11 ENCOUNTER — Ambulatory Visit (INDEPENDENT_AMBULATORY_CARE_PROVIDER_SITE_OTHER): Payer: 59 | Admitting: Licensed Clinical Social Worker

## 2021-04-11 DIAGNOSIS — F4010 Social phobia, unspecified: Secondary | ICD-10-CM | POA: Diagnosis not present

## 2021-04-11 DIAGNOSIS — F4310 Post-traumatic stress disorder, unspecified: Secondary | ICD-10-CM
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
## 2021-04-11 NOTE — Plan of Care (Signed)
Developed with pt input ?

## 2021-04-12 ENCOUNTER — Encounter: Payer: 59 | Attending: Psychology | Admitting: Psychology

## 2021-04-12 ENCOUNTER — Encounter: Payer: Self-pay | Admitting: Psychology

## 2021-04-12 ENCOUNTER — Other Ambulatory Visit: Payer: Self-pay

## 2021-04-12 ENCOUNTER — Other Ambulatory Visit (HOSPITAL_BASED_OUTPATIENT_CLINIC_OR_DEPARTMENT_OTHER): Payer: Self-pay

## 2021-04-12 DIAGNOSIS — F4310 Post-traumatic stress disorder, unspecified: Secondary | ICD-10-CM | POA: Insufficient documentation

## 2021-04-12 DIAGNOSIS — F331 Major depressive disorder, recurrent, moderate: Secondary | ICD-10-CM | POA: Diagnosis present

## 2021-04-12 DIAGNOSIS — R41840 Attention and concentration deficit: Secondary | ICD-10-CM | POA: Insufficient documentation

## 2021-04-12 DIAGNOSIS — F4010 Social phobia, unspecified: Secondary | ICD-10-CM | POA: Insufficient documentation

## 2021-04-12 DIAGNOSIS — M4310 Spondylolisthesis, site unspecified: Secondary | ICD-10-CM | POA: Insufficient documentation
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
## 2021-04-20 ENCOUNTER — Ambulatory Visit: Payer: 59 | Admitting: Psychology

## 2021-04-21 DIAGNOSIS — R41840 Attention and concentration deficit: Secondary | ICD-10-CM

## 2021-04-21 NOTE — Progress Notes (Signed)
? ?  Behavioral Observations ? ?The patient arrived early to her 3:00 testing appointment. She appeared well-groomed and appropriately dressed. The patient had some difficulty understanding testing instructions, particularly for the discriminate reaction time test. ? ?Neuropsychology Note ? ?[name] completed 60 minutes of neuropsychological testing with technician, Dina Rich, BA, under the supervision of Ilean Skill, PsyD., Clinical Neuropsychologist. The patient did not appear overtly distressed by the testing session, per behavioral observation or via self-report to the technician. Rest breaks were offered.  ? ?Clinical Decision Making: In considering the patient's current level of functioning, level of presumed impairment, nature of symptoms, emotional and behavioral responses during clinical interview, level of literacy, and observed level of motivation/effort, a battery of tests was selected by Dr. Sima Matas during initial consultation on 04/12/2021. This was communicated to the technician. Communication between the neuropsychologist and technician was ongoing throughout the testing session and changes were made as deemed necessary based on patient performance on testing, technician observations and additional pertinent factors such as those listed above. ? ?Tests Administered: ?Comprehensive Attention Battery (CAB) ?Continuous Performance Test (CPT) ? ?Results: ?Will be included in final report  ? ?Feedback to Patient: ?[name] will return on 05/25/2021 for an interactive feedback session with Dr. Sima Matas at which time her test performances, clinical impressions and treatment recommendations will be reviewed in detail. The patient understands she can contact our office should she require our assistance before this time. ? ?60 minutes spent face-to-face with patient administering standardized tests, 30 minutes spent scoring (technician). [CPT [id number], [id number]] ? ?Full report to follow.  ?

## 2021-05-02 ENCOUNTER — Telehealth: Payer: Self-pay

## 2021-05-02 ENCOUNTER — Other Ambulatory Visit: Payer: Self-pay

## 2021-05-02 NOTE — Telephone Encounter (Signed)
Pt called in stated that she would like for you to send in the vicodin 5-325 mg and start prescribing it for her. I told her she needed a mychart or office visit. She said you told her you would prescribe it for her. ?

## 2021-05-03 ENCOUNTER — Other Ambulatory Visit (HOSPITAL_BASED_OUTPATIENT_CLINIC_OR_DEPARTMENT_OTHER): Payer: Self-pay

## 2021-05-03 ENCOUNTER — Other Ambulatory Visit: Payer: Self-pay | Admitting: Family Medicine

## 2021-05-03 MED ORDER — HYDROCODONE-ACETAMINOPHEN 5-325 MG PO TABS
1.0000 | ORAL_TABLET | Freq: Four times a day (QID) | ORAL | 0 refills | Status: DC | PRN
  Filled 2021-05-03 – 2021-05-23 (×2): qty 15, 4d supply, fill #0

## 2021-05-03 NOTE — Telephone Encounter (Signed)
Pt aware.

## 2021-05-05 ENCOUNTER — Encounter: Payer: 59 | Attending: Psychology | Admitting: Psychology

## 2021-05-05 DIAGNOSIS — F4010 Social phobia, unspecified: Secondary | ICD-10-CM | POA: Insufficient documentation

## 2021-05-05 DIAGNOSIS — F331 Major depressive disorder, recurrent, moderate: Secondary | ICD-10-CM | POA: Insufficient documentation

## 2021-05-05 DIAGNOSIS — F4310 Post-traumatic stress disorder, unspecified: Secondary | ICD-10-CM | POA: Diagnosis present

## 2021-05-05 DIAGNOSIS — Z72820 Sleep deprivation: Secondary | ICD-10-CM | POA: Insufficient documentation
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
## 2021-05-10 ENCOUNTER — Other Ambulatory Visit (HOSPITAL_BASED_OUTPATIENT_CLINIC_OR_DEPARTMENT_OTHER): Payer: Self-pay

## 2021-05-10 ENCOUNTER — Encounter: Payer: 59 | Admitting: Psychology

## 2021-05-11 ENCOUNTER — Ambulatory Visit: Payer: Self-pay | Admitting: Psychiatry

## 2021-05-18 ENCOUNTER — Other Ambulatory Visit (HOSPITAL_BASED_OUTPATIENT_CLINIC_OR_DEPARTMENT_OTHER): Payer: Self-pay

## 2021-05-18 ENCOUNTER — Other Ambulatory Visit: Payer: Self-pay | Admitting: Family Medicine

## 2021-05-18 MED ORDER — DIAZEPAM 5 MG PO TABS
5.0000 mg | ORAL_TABLET | ORAL | 0 refills | Status: DC | PRN
  Filled 2021-05-18: qty 30, 30d supply, fill #0

## 2021-05-18 NOTE — Telephone Encounter (Signed)
Requesting: diazepam ?Contract: none ?UDS: none ?Last Visit: 01/31/21 ?Next Visit: 08/01/21 ?Last Refill: 01/27/21 ? ?Please Advise ? ?

## 2021-05-19 ENCOUNTER — Other Ambulatory Visit (HOSPITAL_BASED_OUTPATIENT_CLINIC_OR_DEPARTMENT_OTHER): Payer: Self-pay

## 2021-05-19 ENCOUNTER — Other Ambulatory Visit: Payer: Self-pay | Admitting: Family Medicine

## 2021-05-19 DIAGNOSIS — F419 Anxiety disorder, unspecified: Secondary | ICD-10-CM

## 2021-05-19 MED ORDER — DIAZEPAM 5 MG PO TABS
5.0000 mg | ORAL_TABLET | Freq: Two times a day (BID) | ORAL | 0 refills | Status: DC | PRN
  Filled 2021-05-19: qty 30, 15d supply, fill #0

## 2021-05-23 ENCOUNTER — Other Ambulatory Visit (HOSPITAL_BASED_OUTPATIENT_CLINIC_OR_DEPARTMENT_OTHER): Payer: Self-pay

## 2021-05-24 ENCOUNTER — Ambulatory Visit (INDEPENDENT_AMBULATORY_CARE_PROVIDER_SITE_OTHER): Payer: 59 | Admitting: Licensed Clinical Social Worker

## 2021-05-24 DIAGNOSIS — F331 Major depressive disorder, recurrent, moderate: Secondary | ICD-10-CM | POA: Diagnosis not present

## 2021-05-24 DIAGNOSIS — F4310 Post-traumatic stress disorder, unspecified: Secondary | ICD-10-CM

## 2021-05-24 NOTE — Plan of Care (Signed)
?  Problem: Reduce the negative impact trauma related symptoms have on social, occupational, and family functioning per pt self report 3 out of 5 sessions documented. PTSD-Trauma Disorder CCP Problem  1  ?Goal: LTG: Reduce frequency, intensity, and duration of PTSD symptoms so daily functioning is improved: Input needed on appropriate metric.  per pt self report ?Outcome: Not Progressing ?Goal: STG: Increase participation in previously avoided activities 3 out of 5 sessions documented ?Outcome: Not Progressing ?  ?
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
## 2021-05-25 ENCOUNTER — Other Ambulatory Visit (HOSPITAL_BASED_OUTPATIENT_CLINIC_OR_DEPARTMENT_OTHER): Payer: Self-pay

## 2021-05-25 ENCOUNTER — Encounter: Payer: 59 | Attending: Psychology | Admitting: Psychology

## 2021-05-25 ENCOUNTER — Encounter: Payer: Self-pay | Admitting: Psychiatry

## 2021-05-25 ENCOUNTER — Ambulatory Visit (INDEPENDENT_AMBULATORY_CARE_PROVIDER_SITE_OTHER): Payer: 59 | Admitting: Psychiatry

## 2021-05-25 VITALS — BP 113/79 | HR 89 | Temp 97.8°F | Wt 134.8 lb

## 2021-05-25 DIAGNOSIS — R41840 Attention and concentration deficit: Secondary | ICD-10-CM

## 2021-05-25 DIAGNOSIS — F4310 Post-traumatic stress disorder, unspecified: Secondary | ICD-10-CM

## 2021-05-25 DIAGNOSIS — F331 Major depressive disorder, recurrent, moderate: Secondary | ICD-10-CM

## 2021-05-25 DIAGNOSIS — F4010 Social phobia, unspecified: Secondary | ICD-10-CM

## 2021-05-25 DIAGNOSIS — G2581 Restless legs syndrome: Secondary | ICD-10-CM

## 2021-05-25 DIAGNOSIS — Z72820 Sleep deprivation: Secondary | ICD-10-CM

## 2021-05-25 DIAGNOSIS — F1021 Alcohol dependence, in remission: Secondary | ICD-10-CM

## 2021-05-25 MED ORDER — ESCITALOPRAM OXALATE 10 MG PO TABS
10.0000 mg | ORAL_TABLET | Freq: Every day | ORAL | 0 refills | Status: DC
  Filled 2021-05-25 (×2): qty 90, 90d supply, fill #0

## 2021-05-25 MED ORDER — TRAZODONE HCL 50 MG PO TABS
50.0000 mg | ORAL_TABLET | Freq: Every day | ORAL | 1 refills | Status: DC
  Filled 2021-05-25: qty 30, 30d supply, fill #0

## 2021-05-25 MED ORDER — BUSPIRONE HCL 30 MG PO TABS
30.0000 mg | ORAL_TABLET | Freq: Two times a day (BID) | ORAL | 0 refills | Status: DC
  Filled 2021-05-25: qty 180, 90d supply, fill #0

## 2021-05-25 NOTE — Progress Notes (Signed)
[facility] MD OP Progress Note ? ?05/25/2021 2:55 PM ?[name]  ?MRN:  [id number] ? ?Chief Complaint:  ?Chief Complaint  ?Patient presents with  ? Follow-up: 56 year old Caucasian female with history of depression, PTSD, anxiety, currently with worsening sleep problems, presented for medication management.  ? ?HPI: [name] is a 56 year old Caucasian female with history of PTSD, MDD, social anxiety, RLS, alcohol use disorder in remission, attention and concentration deficit, chronic back pain, married, lives in [place], applying for disability was evaluated in office today. ? ?Patient today reports she is currently struggling with mood symptoms like irritability, low frustration tolerance, crying spells and racing thoughts.  Patient reports she recently watched a movie and that triggered her trauma related memories.  Patient reports currently having a lot of racing thoughts.  She is currently on the Lexapro 10 mg which she started 2 weeks ago.  Has not noticed much benefit from it yet.  Denies side effects. ? ?Patient struggles with sleep.  She reports she has difficulty staying asleep.  She is in a lot of pain.  She reports her primary care provider is going to start her on Vicodin soon.  She has not started taking it yet but would like to be on it.  Patient also reports possible restless leg symptoms, currently not taking the Requip since the past 1-1/2 weeks or so.  Agreeable to go back on it.  Patient also reports possible nightmares at night since the past few weeks which could have been triggered by her trauma related memories. ? ?Patient is currently under the care of her therapist and agrees to stay in therapy. ? ?Patient had neuropsychological testing completed by Dr. Sima Matas.  Reviewed and discussed it with patient.  She does not meet criteria for ADHD and her attention and focus problems likely due to her trauma related symptoms as well as sleep deprivation.  This was discussed with patient.   Discussed with patient I do not recommend psychostimulants at this time for her current symptoms. ? ?Patient denies any suicidality, homicidality or perceptual disturbances. ? ?Patient denies any other concerns today. ? ?Visit Diagnosis:  ?  ICD-10-CM   ?1. PTSD (post-traumatic stress disorder)  F43.10 escitalopram (LEXAPRO) 10 MG tablet  ?  busPIRone (BUSPAR) 30 MG tablet  ?  traZODone (DESYREL) 50 MG tablet  ?  ?2. MDD (major depressive disorder), recurrent episode, moderate (HCC)  F33.1 escitalopram (LEXAPRO) 10 MG tablet  ?  busPIRone (BUSPAR) 30 MG tablet  ?  traZODone (DESYREL) 50 MG tablet  ?  ?3. Social anxiety disorder  F40.10 escitalopram (LEXAPRO) 10 MG tablet  ?  busPIRone (BUSPAR) 30 MG tablet  ?  ?4. RLS (restless legs syndrome)  G25.81   ?  ?5. Alcohol use disorder, severe, in sustained remission ([place])  F10.21   ?  ?6. Attention and concentration deficit  R41.840   ?  ? ? ?Past Psychiatric History: Reviewed past psychiatric history from progress note on 04/04/2021.  Past trials of medications-multiple.  Reviewed records from Dr. Toy Care.2007 -2022-multiple visits.  Patient with diagnosis of major depression as well as ADD per medical records..  Past trials of medications like Wellbutrin, Zoloft, Xanax, Cymbalta Pristiq, BuSpar, Deplin, Valium, Viibryd, Trintellix. ? ?Patient however had neuropsychological testing completed for diagnostic clarification-05/05/2021-Per Dr. Kittie Plater does not have diagnosis of ADHD.  She was diagnosed with PTSD, social anxiety disorder, MDD.  Attention and concentration deficits likely due to her current depression, anxiety and trauma related symptoms as well as sleep  deprived vision. ? ?Past Medical History:  ?Past Medical History:  ?Diagnosis Date  ? Allergy   ? Anemia   ? Anxiety   ? Asthma   ? Depression   ? GERD (gastroesophageal reflux disease)   ? Heart murmur   ? from childhood  ? Hiatal hernia   ? Hypertension   ? Peptic ulcer   ? PTSD (post-traumatic stress  disorder)   ? UTI (urinary tract infection)   ?  ?Past Surgical History:  ?Procedure Laterality Date  ? ABDOMINAL HYSTERECTOMY    ? fibroids  ? BIOPSY  07/11/2018  ? Procedure: BIOPSY;  Surgeon: [name], MD;  Location: [facility] ENDOSCOPY;  Service: Endoscopy;;  ? COLONOSCOPY  2014  ? ESOPHAGOGASTRODUODENOSCOPY (EGD) WITH PROPOFOL N/A 07/11/2018  ? Procedure: ESOPHAGOGASTRODUODENOSCOPY (EGD) WITH PROPOFOL;  Surgeon: [name], MD;  Location[facility] ENDOSCOPY;  Service: Endoscopy;  Laterality: N/A;  ? GASTRIC BYPASS  2007  ? HEMOSTASIS CLIP PLACEMENT  07/11/2018  ? Procedure: HEMOSTASIS CLIP PLACEMENT;  Surgeon: [name], MD;  Location: [facility];  Service: Endoscopy;;  ? LAPAROTOMY N/A 03/08/2018  ? Procedure: EXPLORATORY LAPAROTOMY, REPAIR AND PATCH CLOSURE OF PERFORATED ULCER;  Surgeon: Georganna Skeans, MD;  Location: [facility];  Service: General;  Laterality: N/A;  ? TUBAL LIGATION    ? ? ?Family Psychiatric History: I have reviewed family psychiatric history from progress note on 04/04/2021. ? ?Family History:  ?Family History  ?Problem Relation Age of Onset  ? Stroke Mother   ? Alzheimer's disease Mother   ? Hypertension Father   ? Cancer Father   ?     Prostate  ? Alcohol abuse Sister   ? Depression Sister   ? Alcohol abuse Brother   ? Depression Brother   ? Diabetes Maternal Grandfather   ? Liver disease Maternal Grandfather   ? Cancer Maternal Grandfather   ?     Prostate  ? Diabetes Maternal Grandmother   ? Alzheimer's disease Maternal Grandmother   ? Diabetes Paternal Grandfather   ? Cancer Paternal Grandfather   ?     Lung, Prostate  ? Diabetes Paternal Grandmother   ? Alzheimer's disease Paternal Grandmother   ? Colon cancer Neg Hx   ? Esophageal cancer Neg Hx   ? Pancreatic cancer Neg Hx   ? Stomach cancer Neg Hx   ? Colon polyps Neg Hx   ? Rectal cancer Neg Hx   ? ? ?Social History: Reviewed social history from progress note on 04/04/2021. ?Social History  ? ?Socioeconomic History  ? Marital  status: Married  ?  Spouse name: mark  ? Number of children: 0  ? Years of education: Not on file  ? Highest education level: High school graduate  ?Occupational History  ?  Employer: [employer]  ?  Comment: [employer]  ?Tobacco Use  ? Smoking status: Former  ?  Types: Cigarettes  ?  Quit date: 01/24/1987  ?  Years since quitting: 34.3  ? Smokeless tobacco: Never  ?Vaping Use  ? Vaping Use: Former  ? Substances: Flavoring  ? Devices: has vaped with flavor but no nicotine. Been 8 years  ?Substance and Sexual Activity  ? Alcohol use: Not Currently  ?  Alcohol/week: 35.0 standard drinks  ?  Types: 35 Cans of beer per week  ? Drug use: No  ? Sexual activity: Not Currently  ?Other Topics Concern  ? Not on file  ?Social History Narrative  ? Regular exercise--no (because of knees)  ? ?  Social Determinants of Health  ? ?Financial Resource Strain: Not on file  ?Food Insecurity: Not on file  ?Transportation Needs: Not on file  ?Physical Activity: Not on file  ?Stress: Not on file  ?Social Connections: Not on file  ? ? ?Allergies:  ?Allergies  ?Allergen Reactions  ? Penicillins Other (See Comments)  ?  Did it involve swelling of the face/tongue/throat, SOB, or low BP? Unk ?Did it involve sudden or severe rash/hives, skin peeling, or any reaction on the inside of your mouth or nose? Unk ?Did you need to seek medical attention at a hospital or doctor's office? Unk ?When did it last happen? Was 56 years old; reaction not recalled     ?If all above answers are "NO", may proceed with cephalosporin use. ?  ? Codeine Itching  ?  Crazy dreams and itching-pt said she can take it with food  ? ? ?Metabolic Disorder Labs: ?No results found for: HGBA1C, MPG ?No results found for: PROLACTIN ?Lab Results  ?Component Value Date  ? CHOL 191 09/23/2020  ? TRIG 61.0 09/23/2020  ? HDL 66.80 09/23/2020  ? CHOLHDL 3 09/23/2020  ? VLDL 12.2 09/23/2020  ? LDLCALC 112 (H) 09/23/2020  ? [place] 51 08/29/2019  ? ?Lab Results  ?Component Value Date  ? TSH  2.29 11/04/2020  ? TSH 4.71 09/23/2020  ? ? ?Therapeutic Level Labs: ?No results found for: LITHIUM ?No results found for: VALPROATE ?No components found for:  CBMZ ? ?Current Medications: ?Current Outpati

## 2021-05-25 NOTE — Patient Instructions (Signed)
Trazodone Tablets What is this medication? TRAZODONE (TRAZ oh done) treats depression. It increases the amount of serotonin in the brain, a hormone that helps regulate mood. This medicine may be used for other purposes; ask your health care provider or pharmacist if you have questions. COMMON BRAND NAME(S): Desyrel What should I tell my care team before I take this medication? They need to know if you have any of these conditions: Attempted suicide or thinking about it Bipolar disorder Bleeding problems Glaucoma Heart disease, or previous heart attack Irregular heart beat Kidney or liver disease Low levels of sodium in the blood An unusual or allergic reaction to trazodone, other medications, foods, dyes or preservatives Pregnant or trying to get pregnant Breast-feeding How should I use this medication? Take this medication by mouth with a glass of water. Follow the directions on the prescription label. Take this medication shortly after a meal or a light snack. Take your medication at regular intervals. Do not take your medication more often than directed. Do not stop taking this medication suddenly except upon the advice of your care team. Stopping this medication too quickly may cause serious side effects or your condition may worsen. A special MedGuide will be given to you by the pharmacist with each prescription and refill. Be sure to read this information carefully each time. Talk to your care team regarding the use of this medication in children. Special care may be needed. Overdosage: If you think you have taken too much of this medicine contact a poison control center or emergency room at once. NOTE: This medicine is only for you. Do not share this medicine with others. What if I miss a dose? If you miss a dose, take it as soon as you can. If it is almost time for your next dose, take only that dose. Do not take double or extra doses. What may interact with this medication? Do not  take this medication with any of the following: Certain medications for fungal infections like fluconazole, itraconazole, ketoconazole, posaconazole, voriconazole Cisapride Dronedarone Linezolid MAOIs like Carbex, Eldepryl, Marplan, Nardil, and Parnate Mesoridazine Methylene blue (injected into a vein) Pimozide Saquinavir Thioridazine This medication may also interact with the following: Alcohol Antiviral medications for HIV or AIDS Aspirin and aspirin-like medications Barbiturates like phenobarbital Certain medications for blood pressure, heart disease, irregular heart beat Certain medications for depression, anxiety, or psychotic disturbances Certain medications for migraine headache like almotriptan, eletriptan, frovatriptan, naratriptan, rizatriptan, sumatriptan, zolmitriptan Certain medications for seizures like carbamazepine and phenytoin Certain medications for sleep Certain medications that treat or prevent blood clots like dalteparin, enoxaparin, warfarin Digoxin Fentanyl Lithium NSAIDS, medications for pain and inflammation, like ibuprofen or naproxen Other medications that prolong the QT interval (cause an abnormal heart rhythm) like dofetilide Rasagiline Supplements like St. John's wort, kava kava, valerian Tramadol Tryptophan This list may not describe all possible interactions. Give your health care provider a list of all the medicines, herbs, non-prescription drugs, or dietary supplements you use. Also tell them if you smoke, drink alcohol, or use illegal drugs. Some items may interact with your medicine. What should I watch for while using this medication? Tell your care team if your symptoms do not get better or if they get worse. Visit your care team for regular checks on your progress. Because it may take several weeks to see the full effects of this medication, it is important to continue your treatment as prescribed by your care team. Watch for new or worsening  thoughts of   suicide or depression. This includes sudden changes in mood, behaviors, or thoughts. These changes can happen at any time but are more common in the beginning of treatment or after a change in dose. Call your care team right away if you experience these thoughts or worsening depression. Manic episodes may happen in patients with bipolar disorder who take this medication. Watch for changes in feelings or behaviors such as feeling anxious, nervous, agitated, panicky, irritable, hostile, aggressive, impulsive, severely restless, overly excited and hyperactive, or trouble sleeping. These changes can happen at any time but are more common in the beginning of treatment or after a change in dose. Call your care team right away if you notice any of these symptoms. You may get drowsy or dizzy. Do not drive, use machinery, or do anything that needs mental alertness until you know how this medication affects you. Do not stand or sit up quickly, especially if you are an older patient. This reduces the risk of dizzy or fainting spells. Alcohol may interfere with the effect of this medication. Avoid alcoholic drinks. This medication may cause dry eyes and blurred vision. If you wear contact lenses you may feel some discomfort. Lubricating drops may help. See your eye doctor if the problem does not go away or is severe. Your mouth may get dry. Chewing sugarless gum, sucking hard candy and drinking plenty of water may help. Contact your care team if the problem does not go away or is severe. What side effects may I notice from receiving this medication? Side effects that you should report to your care team as soon as possible: Allergic reactions--skin rash, itching, hives, swelling of the face, lips, tongue, or throat Bleeding--bloody or black, tar-like stools, red or dark brown urine, vomiting blood or brown material that looks like coffee grounds, small, red or purple spots on skin, unusual bleeding or  bruising Heart rhythm changes--fast or irregular heartbeat, dizziness, feeling faint or lightheaded, chest pain, trouble breathing Low blood pressure--dizziness, feeling faint or lightheaded, blurry vision Low sodium level--muscle weakness, fatigue, dizziness, headache, confusion Prolonged or painful erection Serotonin syndrome--irritability, confusion, fast or irregular heartbeat, muscle stiffness, twitching muscles, sweating, high fever, seizures, chills, vomiting, diarrhea Sudden eye pain or change in vision such as blurry vision, seeing halos around lights, vision loss Thoughts of suicide or self-harm, worsening mood, feelings of depression Side effects that usually do not require medical attention (report to your care team if they continue or are bothersome): Change in sex drive or performance Constipation Dizziness Drowsiness Dry mouth This list may not describe all possible side effects. Call your doctor for medical advice about side effects. You may report side effects to FDA at 1-800-FDA-1088. Where should I keep my medication? Keep out of the reach of children and pets. Store at room temperature between 15 and 30 degrees C (59 to 86 degrees F). Protect from light. Keep container tightly closed. Throw away any unused medication after the expiration date. NOTE: This sheet is a summary. It may not cover all possible information. If you have questions about this medicine, talk to your doctor, pharmacist, or health care provider.  2023 Elsevier/Gold Standard (2019-12-31 00:00:00)  

## 2021-06-06 ENCOUNTER — Encounter: Payer: Self-pay | Admitting: Psychology

## 2021-06-06 NOTE — Progress Notes (Signed)
? ? ?06/07/2021 ?11:38 AM  ? ?[name] ?Jun 04, 1965 ?[id number] ? ?Referring provider: Carollee Herter, Alferd Apa, DO ?[address] ?[address] ?[place],  [place] [address] ? ?Urological history: ?1. rUTI's ?-contributing factors of vaginal atrophy, IBS and incomplete bladder emptying ?-RUS, 03/2021 small left renal cyst  ?-documented positive urine cultures over the last year ? MDRO E.coli 03/10/2021 ? E.coli 01/31/2021 ?-managed with topical estrogen cream, pro-biotics, D-mannose and cranberry tablets ?   ?Chief Complaint  ?Patient presents with  ? Over Active Bladder  ? ? ?HPI: ?[name] is a 56 y.o. female who presents today for a 3 month follow up. ? ?She is experiencing 1-7 daytime urinations, 1-2 nighttime urinations and no urgency.  She is experiencing stress incontinence leaking 1-2 times weekly.  She is not wearing any absorbent products for her leakage.  She does engage in toilet mapping. ? ?Patient denies any modifying or aggravating factors.  Patient denies any gross hematuria, dysuria or suprapubic/flank pain.  Patient denies any fevers, chills, nausea or vomiting.    ? ?PMH: ?Past Medical History:  ?Diagnosis Date  ? Allergy   ? Anemia   ? Anxiety   ? Asthma   ? Depression   ? GERD (gastroesophageal reflux disease)   ? Heart murmur   ? from childhood  ? Hiatal hernia   ? Hypertension   ? Peptic ulcer   ? PTSD (post-traumatic stress disorder)   ? UTI (urinary tract infection)   ? ? ?Surgical History: ?Past Surgical History:  ?Procedure Laterality Date  ? ABDOMINAL HYSTERECTOMY    ? fibroids  ? BIOPSY  07/11/2018  ? Procedure: BIOPSY;  Surgeon: [name], MD;  Location: [facility] ENDOSCOPY;  Service: Endoscopy;;  ? COLONOSCOPY  2014  ? ESOPHAGOGASTRODUODENOSCOPY (EGD) WITH PROPOFOL N/A 07/11/2018  ? Procedure: ESOPHAGOGASTRODUODENOSCOPY (EGD) WITH PROPOFOL;  Surgeon: [name], MD;  Location: [facility] ENDOSCOPY;  Service: Endoscopy;  Laterality: N/A;  ? GASTRIC BYPASS  2007  ? HEMOSTASIS CLIP  PLACEMENT  07/11/2018  ? Procedure: HEMOSTASIS CLIP PLACEMENT;  Surgeon: [name], MD;  Location: [facility];  Service: Endoscopy;;  ? LAPAROTOMY N/A 03/08/2018  ? Procedure: EXPLORATORY LAPAROTOMY, REPAIR AND PATCH CLOSURE OF PERFORATED ULCER;  Surgeon: Georganna Skeans, MD;  Location: [facility];  Service: General;  Laterality: N/A;  ? TUBAL LIGATION    ? ? ?Home Medications:  ?Allergies as of 06/07/2021   ? ?   Reactions  ? Penicillins Other (See Comments)  ? Did it involve swelling of the face/tongue/throat, SOB, or low BP? Unk ?Did it involve sudden or severe rash/hives, skin peeling, or any reaction on the inside of your mouth or nose? Unk ?Did you need to seek medical attention at a hospital or doctor's office? Unk ?When did it last happen? Was 56 years old; reaction not recalled     ?If all above answers are "NO", may proceed with cephalosporin use.  ? Codeine Itching  ? Crazy dreams and itching-pt said she can take it with food  ? ?  ? ?  ?Medication List  ?  ? ?  ? Accurate as of Jun 07, 2021 11:38 AM. If you have any questions, ask your nurse or doctor.  ?  ?  ? ?  ? ?Align Chew ?Chew 2 tablets by mouth daily. Align pre and probiotic with vitamain C-take one tablet each ?Align pre and probiotic with Vitamin b12 for immune and energy-take one tablet each ?  ?ALLERGY MEDICINE PO ?  Take 1 tablet by mouth as needed (Takes the Clortabs. Equate Brand). ?  ?amphetamine-dextroamphetamine 20 MG tablet ?Commonly known as: Adderall ?Take 1 tablet by mouth 3 times daily ?  ?B-D 3CC LUER-LOK SYR 25GX5/8" 25G X 5/8" 3 ML Misc ?Generic drug: SYRINGE-NEEDLE (DISP) 3 ML ?USE AS DIRECTED WITH B-12 INJECTION ?  ?busPIRone 30 MG tablet ?Commonly known as: BUSPAR ?Take 1 tablet (30 mg total) by mouth in the morning and at bedtime. ?  ?colestipol 1 g tablet ?Commonly known as: COLESTID ?Take 1 tablet (1 g total) by mouth 2 (two) times daily. ?  ?cyanocobalamin 1000 MCG/ML injection ?Commonly known as: (VITAMIN B-12) ?Inject 1  mL (1,000 mcg total) into the muscle every 30 (thirty) days. ?  ?cyclobenzaprine 10 MG tablet ?Commonly known as: FLEXERIL ?Take 1 tablet (10 mg total) by mouth 3 (three) times daily as needed. for muscle spams ?  ?diazepam 5 MG tablet ?Commonly known as: VALIUM ?Take 1 tablet (5 mg total) by mouth every 12 (twelve) hours as needed for anxiety. ?  ?Diclofenac Sodium 2 % Soln ?Apply 1 pump twice daily as needed. ?  ?escitalopram 10 MG tablet ?Commonly known as: Lexapro ?Take 1 tablet (10 mg total) by mouth daily. ?  ?estradiol 0.1 MG/24HR patch ?Commonly known as: VIVELLE-DOT ?  ?estradiol 0.1 MG/GM vaginal cream ?Commonly known as: ESTRACE ?Apply a pea-sized amount of cream to the fingertip and wipe in the front part of the vagina twice weekly ?  ?fluconazole 150 MG tablet ?Commonly known as: DIFLUCAN ?Take 1 tablet by mouth for 1 day. May repeat in 3 days as needed ?  ?fluticasone 50 MCG/ACT nasal spray ?Commonly known as: FLONASE ?Place 2 sprays into both nostrils daily. ?  ?furosemide 40 MG tablet ?Commonly known as: LASIX ?Take 1 tablet (40 mg total) by mouth daily. ?  ?HYDROcodone-acetaminophen 5-325 MG tablet ?Commonly known as: NORCO/VICODIN ?Take 1 tablet by mouth every 6 (six) hours as needed. ?  ?hydrOXYzine 25 MG capsule ?Commonly known as: Vistaril ?Take 1 capsule (25 mg total) by mouth 2 (two) times daily as needed for anxiety. And sleep, racing thoughts ?  ?loperamide 2 MG capsule ?Commonly known as: IMODIUM ?Take 2 mg by mouth daily as needed. ?  ?omeprazole 40 MG capsule ?Commonly known as: PRILOSEC ?TAKE 1 CAPSULE BY MOUTH TWICE DAILY. OPEN CAPSULE AND SPRINKLE ON APPLESAUCE. ?  ?rOPINIRole 1 MG tablet ?Commonly known as: REQUIP ?TAKE 1 TABLET (1 MG TOTAL) BY MOUTH AT BEDTIME. ?  ?SM Vitamin D3 50 MCG (2000 UT) Caps ?Generic drug: Cholecalciferol ?Take 1 capsule (2,000 Units total) by mouth daily. ?  ?traZODone 50 MG tablet ?Commonly known as: DESYREL ?Take 1 tablet (50 mg total) by mouth at  bedtime. ?  ?TYLENOL 8 HOUR ARTHRITIS PAIN PO ?Take 2 tablets by mouth as needed. ?  ?VITAMIN A PO ?Take by mouth. ?  ? ?  ? ? ?Allergies:  ?Allergies  ?Allergen Reactions  ? Penicillins Other (See Comments)  ?  Did it involve swelling of the face/tongue/throat, SOB, or low BP? Unk ?Did it involve sudden or severe rash/hives, skin peeling, or any reaction on the inside of your mouth or nose? Unk ?Did you need to seek medical attention at a hospital or doctor's office? Unk ?When did it last happen? Was 56 years old; reaction not recalled     ?If all above answers are "NO", may proceed with cephalosporin use. ?  ? Codeine Itching  ?  Crazy dreams and  itching-pt said she can take it with food  ? ? ?Family History: ?Family History  ?Problem Relation Age of Onset  ? Stroke Mother   ? Alzheimer's disease Mother   ? Hypertension Father   ? Cancer Father   ?     Prostate  ? Alcohol abuse Sister   ? Depression Sister   ? Alcohol abuse Brother   ? Depression Brother   ? Diabetes Maternal Grandfather   ? Liver disease Maternal Grandfather   ? Cancer Maternal Grandfather   ?     Prostate  ? Diabetes Maternal Grandmother   ? Alzheimer's disease Maternal Grandmother   ? Diabetes Paternal Grandfather   ? Cancer Paternal Grandfather   ?     Lung, Prostate  ? Diabetes Paternal Grandmother   ? Alzheimer's disease Paternal Grandmother   ? Colon cancer Neg Hx   ? Esophageal cancer Neg Hx   ? Pancreatic cancer Neg Hx   ? Stomach cancer Neg Hx   ? Colon polyps Neg Hx   ? Rectal cancer Neg Hx   ? ? ?Social History:  reports that she quit smoking about 34 years ago. Her smoking use included cigarettes. She has never used smokeless tobacco. She reports that she does not currently use alcohol after a past usage of about 35.0 standard drinks per week. She reports that she does not use drugs. ? ?ROS: ?Pertinent ROS in HPI ? ?Physical Exam: ?BP (!) 144/80   Pulse 62   Ht '5\' 2"'$  (1.575 m)   Wt 130 lb (59 kg)   BMI 23.78 kg/m?    ?Constitutional:  Well nourished. Alert and oriented, No acute distress. ?HEENT: [place] AT, moist mucus membranes.  Trachea midline, no masses. ?Cardiovascular: No clubbing, cyanosis, or edema. ?Respiratory: Normal respiratory effort, no i

## 2021-06-06 NOTE — Progress Notes (Signed)
Today I provided feedback regarding the results of the recent neuropsychological evaluation were attempting to help differentiate various diagnostic considerations.  The patient completed a neuropsychological evaluation that did not find symptoms consistent with adult residual attention deficit disorder and the symptoms appear much more consistent with PTSD, sleep deprivation and depression anxiety.  Below you will find a copy of the impression and diagnosis from that evaluation.  He can be found in its entirety in the patient's EMR dated 05/05/2021. ? ? ?Impression/Diagnosis:                     Overall, the results of the current neuropsychological evaluation taking into account the patient's subjective reports and history, review of available medical records as well as objective measure of multiple factors and aspects of attention and concentration are not particularly consistent with a diagnosis of adult residual attention deficit disorder.  The patient did very well on sustained attentional measures, focus execute abilities without distraction, visual encoding measures, and her general speed of information processing.  The patient did not show difficulties with visual scanning per se or her ability to inhibit impulsive types of responses.  Executive functioning appeared to be within normal limits.  There were specific attentional deficits noted.  1 had to do with difficulties with shifting attention in the most demanding trials as well as significant deficits with regard to distractibility and freedom from distraction.  The types of attentional deficits noted on the objective assessment are most often seen where sleep deprivation is a primary component.  The patient also showed some relative weaknesses with regard to auditory encoding which is often seen with depression and anxiety types of symptoms.  Overall, the pattern is not consistent with deficits in prefrontal brain regions bilaterally which are the most  salient feature related to specific diagnosis of attention deficit disorder.  I suspect that her reported benefit from Adderall has to do with counteracting some of the effects on a day-to-day basis regarding significant chronic sleep deprivation.  The patient clearly has ongoing psychiatric issues related to posttraumatic stress disorder which is chronic and severe, significant anxiety and depression which are often are significant components to PTSD as well as a potential issue with auditory processing related to potential mild central auditory processing deficits.  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 ? ?As far as treatment recommendations the patient clearly should continue to work on issues related to her PTSD, depression and anxiety symptoms.  They are clearly having a significant impact on her day-to-day life functioning and even though her husband tries to not trigger acute exacerbation of her PTSD symptoms he is on psychiatric/neurological disorder likely complicate this greatly.  Of all the aspects that would likely help her the most working on improved sleep efficiency and sleep pattern is likely the most significant impact we could have on her overall life functioning.  This chronic sleep deprivation likely has an interaction with her chronic PTSD.  While there is nothing in her neuropsychological data that would suggest she would have particular improvements in attention and concentration with Adderall it may help indirectly on a day-to-day  factor  related to her chronic sleep deprivation.  However, there is a risk with psychostimulants that it could exacerbate and have a negative impact on her PTSD and anxiety type symptoms.  Even with Adderall being used currently she continues to have ongoing difficulties with attention and concentration.  There is not an obvious or absolute warning for her regarding use of Adderall such as tics or Tourette's type symptoms, significant obsessive compulsive types of symptoms or history of seizure disorder noted.  However, clear awareness that Adderall could exacerbate some of her other symptoms needs to be added to any decision making about continuing psychostimulant medications.  Again, I think that one of the biggest focus of psychiatric care should be on attempts to improve overall sleep patterns and focusing on therapeutic efforts around chronic PTSD and efforts as far as psychotropic measures for her anxiety and depression. ? ?I will sit down with the patient go over the results of the psychological/neuropsychological evaluation and make sure this evaluation is available in her electronic medical records for her treating psychiatrist. ?  ?Diagnosis:                              ?  ?PTSD (post-traumatic stress disorder) ?  ?Sleep deprivation ?  ?MDD (major depressive disorder), recurrent episode, moderate ([place]) ?  ?Social anxiety disorder ?  ?  ?_____________________ ?Ilean Skill, Psy.D. ?Clinical Neuropsychologist ?

## 2021-06-07 ENCOUNTER — Other Ambulatory Visit (HOSPITAL_BASED_OUTPATIENT_CLINIC_OR_DEPARTMENT_OTHER): Payer: Self-pay

## 2021-06-07 ENCOUNTER — Telehealth: Payer: Self-pay | Admitting: Psychiatry

## 2021-06-07 ENCOUNTER — Ambulatory Visit: Payer: 59 | Admitting: Urology

## 2021-06-07 ENCOUNTER — Encounter: Payer: Self-pay | Admitting: Urology

## 2021-06-07 VITALS — BP 144/80 | HR 62 | Ht 62.0 in | Wt 130.0 lb

## 2021-06-07 DIAGNOSIS — N952 Postmenopausal atrophic vaginitis: Secondary | ICD-10-CM

## 2021-06-07 DIAGNOSIS — Z87440 Personal history of urinary (tract) infections: Secondary | ICD-10-CM | POA: Diagnosis not present

## 2021-06-07 DIAGNOSIS — N390 Urinary tract infection, site not specified: Secondary | ICD-10-CM

## 2021-06-07 LAB — BLADDER SCAN AMB NON-IMAGING

## 2021-06-07 MED ORDER — ESTRADIOL 0.1 MG/GM VA CREA
TOPICAL_CREAM | VAGINAL | 0 refills | Status: DC
  Filled 2021-06-07: qty 42.5, 30d supply, fill #0
  Filled 2021-06-29: qty 42.5, 90d supply, fill #0
  Filled 2021-10-26: qty 42.5, 30d supply, fill #0
  Filled 2021-10-26: qty 42.5, 90d supply, fill #0
  Filled 2021-10-26: qty 42.5, 30d supply, fill #0

## 2021-06-07 NOTE — Patient Instructions (Signed)
Kegel Exercises  Kegel exercises can help strengthen your pelvic floor muscles. The pelvic floor is a group of muscles that support your rectum, small intestine, and bladder. In females, pelvic floor muscles also help support the uterus. These muscles help you control the flow of urine and stool (feces). Kegel exercises are painless and simple. They do not require any equipment. Your provider may suggest Kegel exercises to: Improve bladder and bowel control. Improve sexual response. Improve weak pelvic floor muscles after surgery to remove the uterus (hysterectomy) or after pregnancy, in females. Improve weak pelvic floor muscles after prostate gland removal or surgery, in males. Kegel exercises involve squeezing your pelvic floor muscles. These are the same muscles you squeeze when you try to stop the flow of urine or keep from passing gas. The exercises can be done while sitting, standing, or lying down, but it is best to vary your position. Ask your health care provider which exercises are safe for you. Do exercises exactly as told by your health care provider and adjust them as directed. Do not begin these exercises until told by your health care provider. Exercises How to do Kegel exercises: Squeeze your pelvic floor muscles tight. You should feel a tight lift in your rectal area. If you are a female, you should also feel a tightness in your vaginal area. Keep your stomach, buttocks, and legs relaxed. Hold the muscles tight for up to 10 seconds. Breathe normally. Relax your muscles for up to 10 seconds. Repeat as told by your health care provider. Repeat this exercise daily as told by your health care provider. Continue to do this exercise for at least 4-6 weeks, or for as long as told by your health care provider. You may be referred to a physical therapist who can help you learn more about how to do Kegel exercises. Depending on your condition, your health care provider may  recommend: Varying how long you squeeze your muscles. Doing several sets of exercises every day. Doing exercises for several weeks. Making Kegel exercises a part of your regular exercise routine. This information is not intended to replace advice given to you by your health care provider. Make sure you discuss any questions you have with your health care provider. Document Revised: 05/20/2020 Document Reviewed: 05/20/2020 Elsevier Patient Education  2023 [employer].  

## 2021-06-07 NOTE — Telephone Encounter (Signed)
Patient walked in to clinic requesting to speak with Dr. Shea Evans. States she tried to send a message through My Chart but was unsuccessful X 2. States she wants to discuss possible side effects from recent medication changes. Symptoms include swelling in hands/fingers/legs, sleep disturbance, leg cramps, and big toe becomes temporarily fixed in upward or downward position that does not resolve with physical manipulation. Informed patient that Dr. Shea Evans is out of the office today and offered her opportunity to speak with CMA and covering provider. She declined and asked that Dr. Shea Evans be sent a note regarding these issues and requested a call back to discuss.  ?

## 2021-06-08 NOTE — Telephone Encounter (Signed)
Returned call to patient, she reports big toe cramps since being on the trazodone.  She also reports sleep problems some nights even when she takes trazodone. ? ?Advised patient to stop the trazodone.  If for side effects or physical symptoms does not get better, advised her to go to the nearest emergency department or get evaluated by primary care provider to rule out other causes or for further treatment. ? ?Patient does have upcoming appointment with provider next week, discussed with her we could discuss further medication changes as needed at that visit. ?

## 2021-06-13 ENCOUNTER — Ambulatory Visit (INDEPENDENT_AMBULATORY_CARE_PROVIDER_SITE_OTHER): Payer: 59 | Admitting: Psychiatry

## 2021-06-13 ENCOUNTER — Encounter: Payer: Self-pay | Admitting: Psychiatry

## 2021-06-13 ENCOUNTER — Other Ambulatory Visit: Payer: Self-pay

## 2021-06-13 VITALS — BP 114/74 | HR 64 | Temp 97.8°F | Wt 131.6 lb

## 2021-06-13 DIAGNOSIS — F4310 Post-traumatic stress disorder, unspecified: Secondary | ICD-10-CM | POA: Diagnosis not present

## 2021-06-13 DIAGNOSIS — F4010 Social phobia, unspecified: Secondary | ICD-10-CM

## 2021-06-13 DIAGNOSIS — F1021 Alcohol dependence, in remission: Secondary | ICD-10-CM

## 2021-06-13 DIAGNOSIS — F331 Major depressive disorder, recurrent, moderate: Secondary | ICD-10-CM

## 2021-06-13 DIAGNOSIS — G2581 Restless legs syndrome: Secondary | ICD-10-CM | POA: Diagnosis not present

## 2021-06-13 MED ORDER — ZOLPIDEM TARTRATE 5 MG PO TABS
5.0000 mg | ORAL_TABLET | Freq: Every evening | ORAL | 0 refills | Status: DC | PRN
  Filled 2021-06-13: qty 30, 30d supply, fill #0

## 2021-06-13 MED ORDER — ESCITALOPRAM OXALATE 10 MG PO TABS
15.0000 mg | ORAL_TABLET | Freq: Every day | ORAL | 0 refills | Status: DC
  Filled 2021-06-13 – 2021-06-29 (×2): qty 135, 90d supply, fill #0
  Filled 2021-07-14: qty 45, 30d supply, fill #0
  Filled 2021-08-01: qty 30, 30d supply, fill #0
  Filled 2021-08-19: qty 30, 30d supply, fill #1
  Filled 2021-08-19: qty 30, 20d supply, fill #1

## 2021-06-13 MED ORDER — BUSPIRONE HCL 15 MG PO TABS: 15.0000 mg | ORAL_TABLET | Freq: Two times a day (BID) | ORAL | 0 refills | Status: DC

## 2021-06-13 NOTE — Patient Instructions (Signed)
Zolpidem Tablets What is this medication? ZOLPIDEM (zole PI dem) treats insomnia. It helps you get to sleep faster and stay asleep throughout the night. It is often used for a short period of time. This medicine may be used for other purposes; ask your health care provider or pharmacist if you have questions. COMMON BRAND NAME(S): Ambien What should I tell my care team before I take this medication? They need to know if you have any of these conditions: Depression History of drug abuse or addiction If you often drink alcohol Liver disease Lung or breathing disease Myasthenia gravis Sleep apnea Sleep-walking, driving, eating or other activity while not fully awake after taking a sleep medication Suicidal thoughts, plans, or attempt; a previous suicide attempt by you or a family member An unusual or allergic reaction to zolpidem, other medications, foods, dyes, or preservatives Pregnant or trying to get pregnant Breast-feeding How should I use this medication? Take this medication by mouth with a glass of water. Follow the directions on the prescription label. It is better to take this medication on an empty stomach and only when you are ready for bed. Do not take your medication more often than directed. If you have been taking this medication for several weeks and suddenly stop taking it, you may get unpleasant withdrawal symptoms. Your care team may want to gradually reduce the dose. Do not stop taking this medication on your own. Always follow your care team's advice. A special MedGuide will be given to you by the pharmacist with each prescription and refill. Be sure to read this information carefully each time. Talk to your care team regarding the use of this medication in children. Special care may be needed. Overdosage: If you think you have taken too much of this medicine contact a poison control center or emergency room at once. NOTE: This medicine is only for you. Do not share this  medicine with others. What if I miss a dose? This does not apply. This medication should only be taken immediately before going to sleep. Do not take double or extra doses. What may interact with this medication? Alcohol Antihistamines for allergy, cough and cold Certain medications for anxiety or sleep Certain medications for depression, like amitriptyline, fluoxetine, sertraline Certain medications for fungal infections like ketoconazole and itraconazole Certain medications for seizures like phenobarbital, primidone Ciprofloxacin Dietary supplements for sleep, like valerian or kava kava General anesthetics like halothane, isoflurane, methoxyflurane, propofol Local anesthetics like lidocaine, pramoxine, tetracaine Medications that relax muscles for surgery Narcotic medications for pain Phenothiazines like chlorpromazine, mesoridazine, prochlorperazine, thioridazine Rifampin This list may not describe all possible interactions. Give your health care provider a list of all the medicines, herbs, non-prescription drugs, or dietary supplements you use. Also tell them if you smoke, drink alcohol, or use illegal drugs. Some items may interact with your medicine. What should I watch for while using this medication? Visit your care team for regular checks on your progress. Keep a regular sleep schedule by going to bed at about the same time each night. Avoid caffeine-containing drinks in the evening hours. When sleep medications are used every night for more than a few weeks, they may stop working. Talk to your care team if you still have trouble sleeping. After taking this medication, you may get up out of bed and do an activity that you do not know you are doing. The next morning, you may have no memory of this. Activities include driving a car ("sleep-driving"), making and eating food,   talking on the phone, sexual activity, and sleep-walking. Serious injuries have occurred. Stop the medication and  call your care team right away if you find out you have done any of these activities. Do not take this medication if you have used alcohol that evening. Do not take it if you have taken another medication for sleep. The risk of doing these sleep-related activities is higher. Wait for at least 8 hours after you take a dose before driving or doing other activities that require full mental alertness. Do not take this medication unless you are able to stay in bed for a full night (7 to 8 hours) before you must be active again. You may have a decrease in mental alertness the day after use, even if you feel that you are fully awake. Tell your care team if you will need to perform activities requiring full alertness, such as driving, the next day. Do not stand or sit up quickly after taking this medication, especially if you are an older patient. This reduces the risk of dizzy or fainting spells. If you or your family notice any changes in your behavior, such as new or worsening depression, thoughts of harming yourself, anxiety, other unusual or disturbing thoughts, or memory loss, call your care team right away. After you stop taking this medication, you may have trouble falling asleep. This is called rebound insomnia. This problem usually goes away on its own after 1 or 2 nights. What side effects may I notice from receiving this medication? Side effects that you should report to your care team as soon as possible: Allergic reactions--skin rash, itching, hives, swelling of the face, lips, tongue, or throat Change in vision such as blurry vision, seeing halos around lights, vision loss CNS depression--slow or shallow breathing, shortness of breath, feeling faint, dizziness, confusion, difficulty staying awake Mood and behavior changes--anxiety, nervousness, confusion, hallucinations, irritability, hostility, thoughts of suicide or self-harm, worsening mood, feelings of depression Unusual sleep behaviors or  activities you do not remember such as driving, eating, or sexual activity Side effects that usually do not require medical attention (report to your care team if they continue or are bothersome): Diarrhea Dizziness Drowsiness the day after use Headache This list may not describe all possible side effects. Call your doctor for medical advice about side effects. You may report side effects to FDA at 1-800-FDA-1088. Where should I keep my medication? Keep out of the reach of children and pets. This medication can be abused. Keep your medication in a safe place to protect it from theft. Do not share this medication with anyone. Selling or giving away this medication is dangerous and against the law. Store at room temperature between 20 and 25 degrees C (68 and 77 degrees F). This medication may cause accidental overdose and death if taken by other adults, children, or pets. Mix any unused medication with a substance like cat litter or coffee grounds. Then throw the medication away in a sealed container like a sealed bag or a coffee can with a lid. Do not use the medication after the expiration date. NOTE: This sheet is a summary. It may not cover all possible information. If you have questions about this medicine, talk to your doctor, pharmacist, or health care provider.  2023 Elsevier/Gold Standard (2020-02-06 00:00:00)

## 2021-06-13 NOTE — Progress Notes (Signed)
[facility] OP Progress Note  06/13/2021 5:05 PM [name]  MRN:  [id number]  Chief Complaint:  Chief Complaint  Patient presents with   Follow-up: 56 year old Caucasian female with history of depression, PTSD, anxiety, with continued sleep problems, presented for medication management.   HPI: [name] is a 56 year old Caucasian female with history of PTSD, MDD, social anxiety disorder, RLS, alcohol use disorder in remission, attention and concentration deficits, chronic back pain, married, lives in [place], applying for disability was evaluated in office today.  Patient today reports she continues to struggle with sleep.  Patient reports she has difficulty staying asleep.  She tries to listen to soft music and that helps her to fall asleep.  Her sleep is interrupted throughout the night.  Trazodone was stopped since it gave her muscle cramps.  She does not know if that caused it or something else.  Patient does have upcoming appointment scheduled with primary care provider for evaluation.  Patient reports she continues to struggle with sadness, anxiety, especially since it was Mother's Day recently.  Patient reports she continues to miss her mother greatly.  Patient became tearful when she discussed this.  She however has been trying to distract herself by picking up on her hobby of photography.  Patient also reports she has been trying to spend time with her family as well as friends.  Patient denies any suicidality, homicidality or perceptual disturbances.  Currently compliant on medications, denies side effects.  Patient denies any other concerns today.  Visit Diagnosis:    ICD-10-CM   1. PTSD (post-traumatic stress disorder)  F43.10 zolpidem (AMBIEN) 5 MG tablet    escitalopram (LEXAPRO) 10 MG tablet    busPIRone (BUSPAR) 15 MG tablet    2. MDD (major depressive disorder), recurrent episode, moderate (HCC)  F33.1 escitalopram (LEXAPRO) 10 MG tablet    busPIRone (BUSPAR) 15  MG tablet    3. Social anxiety disorder  F40.10 escitalopram (LEXAPRO) 10 MG tablet    4. RLS (restless legs syndrome)  G25.81     5. Alcohol use disorder, severe, in sustained remission ([place])  F10.21       Past Psychiatric History: Reviewed past psychiatric history from progress note on 04/04/2021.  Past trials of medications-Wellbutrin, Zoloft, Xanax, Cymbalta, Pristiq, BuSpar, Deplin, Valium, Viibryd, Trintellix.  Patient with neuropsychological testing completed-05/05/2021-Per Dr. Kittie Plater does not meet criteria for ADHD.  Past Medical History:  Past Medical History:  Diagnosis Date   Allergy    Anemia    Anxiety    Asthma    Depression    GERD (gastroesophageal reflux disease)    Heart murmur    from childhood   Hiatal hernia    Hypertension    Peptic ulcer    PTSD (post-traumatic stress disorder)    UTI (urinary tract infection)     Past Surgical History:  Procedure Laterality Date   ABDOMINAL HYSTERECTOMY     fibroids   BIOPSY  07/11/2018   Procedure: BIOPSY;  Surgeon: [name], MD;  Location: [facility];  Service: Endoscopy;;   COLONOSCOPY  2014   ESOPHAGOGASTRODUODENOSCOPY (EGD) WITH PROPOFOL N/A 07/11/2018   Procedure: ESOPHAGOGASTRODUODENOSCOPY (EGD) WITH PROPOFOL;  Surgeon: [name], MD;  Location: [facility];  Service: Endoscopy;  Laterality: N/A;   GASTRIC BYPASS  2007   HEMOSTASIS CLIP PLACEMENT  07/11/2018   Procedure: HEMOSTASIS CLIP PLACEMENT;  Surgeon: [name], MD;  Location: [facility];  Service: Endoscopy;;   LAPAROTOMY N/A 03/08/2018  Procedure: EXPLORATORY LAPAROTOMY, REPAIR AND PATCH CLOSURE OF PERFORATED ULCER;  Surgeon: Georganna Skeans, MD;  Location: [facility];  Service: General;  Laterality: N/A;   TUBAL LIGATION      Family Psychiatric History: Reviewed family psychiatric history from progress note on 04/04/2021.  Family History:  Family History  Problem Relation Age of Onset   Stroke Mother     Alzheimer's disease Mother    Hypertension Father    Cancer Father        Prostate   Alcohol abuse Sister    Depression Sister    Alcohol abuse Brother    Depression Brother    Diabetes Maternal Grandfather    Liver disease Maternal Grandfather    Cancer Maternal Grandfather        Prostate   Diabetes Maternal Grandmother    Alzheimer's disease Maternal Grandmother    Diabetes Paternal Grandfather    Cancer Paternal Grandfather        Lung, Prostate   Diabetes Paternal Grandmother    Alzheimer's disease Paternal Grandmother    Colon cancer Neg Hx    Esophageal cancer Neg Hx    Pancreatic cancer Neg Hx    Stomach cancer Neg Hx    Colon polyps Neg Hx    Rectal cancer Neg Hx     Social History: Reviewed social history from progress note on 04/04/2021. Social History   Socioeconomic History   Marital status: Married    Spouse name: mark   Number of children: 0   Years of education: Not on file   Highest education level: High school graduate  Occupational History    Employer: [employer]    Comment: [employer]  Tobacco Use   Smoking status: Former    Types: Cigarettes    Quit date: 01/24/1987    Years since quitting: 34.4   Smokeless tobacco: Never  Vaping Use   Vaping Use: Former   Substances: Flavoring   Devices: has vaped with flavor but no nicotine. Been 8 years  Substance and Sexual Activity   Alcohol use: Not Currently    Alcohol/week: 35.0 standard drinks    Types: 35 Cans of beer per week   Drug use: No   Sexual activity: Not Currently  Other Topics Concern   Not on file  Social History Narrative   Regular exercise--no (because of knees)   Social Determinants of Health   Financial Resource Strain: Not on file  Food Insecurity: Not on file  Transportation Needs: Not on file  Physical Activity: Not on file  Stress: Not on file  Social Connections: Not on file    Allergies:  Allergies  Allergen Reactions   Penicillins Other (See Comments)     Did it involve swelling of the face/tongue/throat, SOB, or low BP? Unk Did it involve sudden or severe rash/hives, skin peeling, or any reaction on the inside of your mouth or nose? Unk Did you need to seek medical attention at a hospital or doctor's office? Unk When did it last happen? Was 56 years old; reaction not recalled     If all above answers are "NO", may proceed with cephalosporin use.    Codeine Itching    Crazy dreams and itching-pt said she can take it with food    Metabolic Disorder Labs: No results found for: HGBA1C, MPG No results found for: PROLACTIN Lab Results  Component Value Date   CHOL 191 09/23/2020   TRIG 61.0 09/23/2020   HDL 66.80 09/23/2020   CHOLHDL  3 09/23/2020   VLDL 12.2 09/23/2020   LDLCALC 112 (H) 09/23/2020   LDLCALC 51 08/29/2019   Lab Results  Component Value Date   TSH 2.29 11/04/2020   TSH 4.71 09/23/2020    Therapeutic Level Labs: No results found for: LITHIUM No results found for: VALPROATE No components found for:  CBMZ  Current Medications: Current Outpatient Medications  Medication Sig Dispense Refill   Acetaminophen (TYLENOL 8 HOUR ARTHRITIS PAIN PO) Take 2 tablets by mouth as needed.     busPIRone (BUSPAR) 15 MG tablet Take 1 tablet (15 mg total) by mouth 2 (two) times daily. Take daily at 9 AM and at 4 PM 180 tablet 0   Cholecalciferol (VITAMIN D3) 50 MCG (2000 UT) capsule Take 1 capsule (2,000 Units total) by mouth daily. 100 capsule 0   colestipol (COLESTID) 1 g tablet Take 1 tablet (1 g total) by mouth 2 (two) times daily. 60 tablet 2   cyanocobalamin (,VITAMIN B-12,) 1000 MCG/ML injection Inject 1 mL (1,000 mcg total) into the muscle every 30 (thirty) days. 10 mL 0   Diclofenac Sodium 2 % SOLN Apply 1 pump twice daily as needed. 112 g 1   estradiol (ESTRACE) 0.1 MG/GM vaginal cream Apply a pea-sized amount of cream to the fingertip and wipe in the front part of the vagina twice weekly 42.5 g 0   estradiol (VIVELLE-DOT) 0.1  MG/24HR patch      fluconazole (DIFLUCAN) 150 MG tablet Take 1 tablet by mouth for 1 day. May repeat in 3 days as needed 2 tablet 0   fluticasone (FLONASE) 50 MCG/ACT nasal spray Place 2 sprays into both nostrils daily. 16 g 5   furosemide (LASIX) 40 MG tablet Take 1 tablet (40 mg total) by mouth daily. 90 tablet 0   Homeopathic Products (ALLERGY MEDICINE PO) Take 1 tablet by mouth as needed (Takes the Clortabs. Equate Brand).     HYDROcodone-acetaminophen (NORCO/VICODIN) 5-325 MG tablet Take 1 tablet by mouth every 6 (six) hours as needed. 15 tablet 0   hydrOXYzine (VISTARIL) 25 MG capsule Take 1 capsule (25 mg total) by mouth 2 (two) times daily as needed for anxiety. And sleep, racing thoughts 60 capsule 1   loperamide (IMODIUM) 2 MG capsule Take 2 mg by mouth daily as needed.      omeprazole (PRILOSEC) 40 MG capsule TAKE 1 CAPSULE BY MOUTH TWICE DAILY. OPEN CAPSULE AND SPRINKLE ON APPLESAUCE. 180 capsule 0   Probiotic Product (ALIGN) CHEW Chew 2 tablets by mouth daily. Align pre and probiotic with vitamain C-take one tablet each Align pre and probiotic with Vitamin b12 for immune and energy-take one tablet each     rOPINIRole (REQUIP) 1 MG tablet TAKE 1 TABLET (1 MG TOTAL) BY MOUTH AT BEDTIME. 90 tablet 0   SYRINGE-NEEDLE, DISP, 3 ML (B-D 3CC LUER-LOK SYR 25GX5/8") 25G X 5/8" 3 ML MISC USE AS DIRECTED WITH B-12 INJECTION 16 each 0   VITAMIN A PO Take by mouth.     zolpidem (AMBIEN) 5 MG tablet Take 1 tablet (5 mg total) by mouth at bedtime as needed for sleep. 30 tablet 0   amphetamine-dextroamphetamine (ADDERALL) 20 MG tablet Take 1 tablet by mouth 3 times daily (Patient not taking: Reported on 06/13/2021) 90 tablet 0   cyclobenzaprine (FLEXERIL) 10 MG tablet Take 1 tablet (10 mg total) by mouth 3 (three) times daily as needed. for muscle spams (Patient not taking: Reported on 06/13/2021) 30 tablet 0   diazepam (VALIUM) 5  MG tablet Take 1 tablet (5 mg total) by mouth every 12 (twelve) hours as  needed for anxiety. (Patient not taking: Reported on 06/13/2021) 30 tablet 0   escitalopram (LEXAPRO) 10 MG tablet Take 1.5 tablets (15 mg total) by mouth daily. 135 tablet 0   No current facility-administered medications for this visit.     Musculoskeletal: Strength & Muscle Tone: within normal limits Gait & Station: normal Patient leans: N/A  Psychiatric Specialty Exam: Review of Systems  Musculoskeletal:  Positive for arthralgias.  Psychiatric/Behavioral:  Positive for dysphoric mood and sleep disturbance. The patient is nervous/anxious.   All other systems reviewed and are negative.  Blood pressure 114/74, pulse 64, temperature 97.8 F (36.6 C), temperature source Temporal, weight 131 lb 9.6 oz (59.7 kg).Body mass index is 24.07 kg/m.  General Appearance: Casual  Eye Contact:  Fair  Speech:  Clear and Coherent  Volume:  Normal  Mood:  Anxious and Depressed  Affect:  Tearful  Thought Process:  Goal Directed and Descriptions of Associations: Intact  Orientation:  Full (Time, Place, and Person)  Thought Content: Logical   Suicidal Thoughts:  No  Homicidal Thoughts:  No  Memory:  Immediate;   Fair Recent;   Fair Remote;   Fair  Judgement:  Fair  Insight:  Fair  Psychomotor Activity:  Normal  Concentration:  Concentration: Fair and Attention Span: Fair  Recall:  [employer] of Knowledge: Fair  Language: Fair  Akathisia:  No  Handed:  Right  AIMS (if indicated): done  Assets:  Communication Skills Desire for Improvement Housing Social Support  ADL's:  Intact  Cognition: WNL  Sleep:  Poor   Screenings: [facility] Office Visit from 06/13/2021 in [facility] Visit from 05/25/2021 in [facility] Total Score 0 0      GAD-7    [place] from 04/11/2021 in [facility] Visit from 04/04/2021 in [facility]  Total  GAD-7 Score 15 16      PHQ2-9    [place] Visit from 06/13/2021 in [facility] from 05/25/2021 in [facility] from 05/24/2021 in [facility] from 04/11/2021 in [facility] Visit from 04/04/2021 in [facility]  PHQ-2 Total Score '6 6 6 6 6  '$ PHQ-9 Total Score '20 20 22 22 23      '$ [place] Visit from 06/13/2021 in [facility] Office Visit from 05/25/2021 in [facility] from 05/24/2021 in [facility] Error: Q3, 4, or 5 should not be populated when Q2 is No No Risk        Assessment and Plan: [name] is a 56 year old Caucasian female with history of PTSD, MDD, social anxiety, alcoholism in remission, chronic pain was evaluated in office today.  Patient continues to struggle with sleep and mood, will benefit from the following plan.  Plan PTSD-unstable Increase Lexapro to 15 mg p.o. daily Reduce BuSpar to 15 mg p.o. twice daily, advised her to take the second dosage earlier at around 4 PM or 5 PM, unknown if this is causing sleep problems. Discontinue trazodone for side effects. Start Ambien 5 mg p.o. nightly as needed. Reviewed [place] PMP aware Provided medication education, discussed sleep complex behaviors, habit forming potential.  MDD-unstable Increase Lexapro to 15  mg p.o. daily Continue CBT with Ms. McMullen as prescribed Hydroxyzine 25 mg p.o. twice daily as needed for anxiety  MDD-unstable Increase Lexapro to 15 mg p.o. daily Continue CBT Hydroxyzine as prescribed as needed  Social anxiety disorder-unstable Increase Lexapro to 15 mg p.o. daily Continue CBT  Restless leg syndromes-improving Requip 1 mg p.o.  nightly.  Alcohol use disorder in remission Sober since February 2022.  Follow-up in clinic in 3 to 4 weeks or sooner if needed.   This note was generated in part or whole with voice recognition software. Voice recognition is usually quite accurate but there are transcription errors that can and very often do occur. I apologize for any typographical errors that were not detected and corrected.   Ursula Alert, MD 06/13/2021, 5:05 PM

## 2021-06-16 ENCOUNTER — Other Ambulatory Visit (HOSPITAL_BASED_OUTPATIENT_CLINIC_OR_DEPARTMENT_OTHER): Payer: Self-pay

## 2021-06-22 ENCOUNTER — Ambulatory Visit (INDEPENDENT_AMBULATORY_CARE_PROVIDER_SITE_OTHER): Payer: 59 | Admitting: Licensed Clinical Social Worker

## 2021-06-22 DIAGNOSIS — F4310 Post-traumatic stress disorder, unspecified: Secondary | ICD-10-CM | POA: Diagnosis not present
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
## 2021-06-22 NOTE — Plan of Care (Signed)
  Problem: Reduce the negative impact trauma related symptoms have on social, occupational, and family functioning per pt self report 3 out of 5 sessions documented. PTSD-Trauma Disorder CCP Problem  1  Goal: LTG: Reduce frequency, intensity, and duration of PTSD symptoms so daily functioning is improved: Input needed on appropriate metric.  per pt self report Outcome: Progressing Goal: STG: Increase participation in previously avoided activities 3 out of 5 sessions documented Outcome: Progressing Intervention: REVIEW PLEASE SKILLS (TREAT PHYSICAL ILLNESS, BALANCE EATING, AVOID MOOD-ALTERING SUBSTANCES, BALANCE SLEEP AND GET EXERCISE) WITH [name] Note: reviewed Intervention: Assess emotional status and coping mechanisms Note: Assessed--improving Intervention: Encourage compliance with prescribed medication regimen Note: Pt compliant Intervention: Assist with relaxation techniques, as appropriate (deep breathing exercises, meditation, guided imagery) Note: reviewed

## 2021-06-29 ENCOUNTER — Other Ambulatory Visit: Payer: Self-pay | Admitting: Family Medicine

## 2021-06-29 ENCOUNTER — Other Ambulatory Visit: Payer: Self-pay

## 2021-06-29 MED ORDER — ESTRADIOL 0.1 MG/24HR TD PTTW
1.0000 | MEDICATED_PATCH | TRANSDERMAL | 5 refills | Status: DC
  Filled 2021-06-29: qty 8, 28d supply, fill #0
  Filled 2021-10-13: qty 8, 28d supply, fill #1
  Filled 2021-11-17: qty 24, 84d supply, fill #2
  Filled 2022-02-20: qty 8, 28d supply, fill #3

## 2021-06-30 ENCOUNTER — Other Ambulatory Visit: Payer: Self-pay

## 2021-07-02 ENCOUNTER — Other Ambulatory Visit: Payer: Self-pay | Admitting: Psychiatry

## 2021-07-02 ENCOUNTER — Other Ambulatory Visit: Payer: Self-pay | Admitting: Family Medicine

## 2021-07-02 DIAGNOSIS — F4310 Post-traumatic stress disorder, unspecified: Secondary | ICD-10-CM

## 2021-07-02 DIAGNOSIS — G2581 Restless legs syndrome: Secondary | ICD-10-CM

## 2021-07-03 ENCOUNTER — Other Ambulatory Visit: Payer: Self-pay

## 2021-07-04 ENCOUNTER — Other Ambulatory Visit: Payer: Self-pay

## 2021-07-04 MED ORDER — ROPINIROLE HCL 1 MG PO TABS
ORAL_TABLET | ORAL | 0 refills | Status: DC
  Filled 2021-07-14: qty 90, 90d supply, fill #0

## 2021-07-04 MED ORDER — ZOLPIDEM TARTRATE 5 MG PO TABS
5.0000 mg | ORAL_TABLET | Freq: Every evening | ORAL | 0 refills | Status: DC | PRN
  Filled 2021-07-14: qty 30, 30d supply, fill #0

## 2021-07-12 ENCOUNTER — Ambulatory Visit: Payer: 59 | Admitting: Licensed Clinical Social Worker

## 2021-07-14 ENCOUNTER — Other Ambulatory Visit: Payer: Self-pay

## 2021-07-19 ENCOUNTER — Ambulatory Visit (INDEPENDENT_AMBULATORY_CARE_PROVIDER_SITE_OTHER): Payer: 59 | Admitting: Family Medicine

## 2021-07-19 ENCOUNTER — Ambulatory Visit: Payer: 59 | Admitting: Family Medicine

## 2021-07-19 VITALS — BP 118/80 | HR 67 | Temp 98.1°F | Resp 18 | Ht 62.0 in | Wt 136.4 lb

## 2021-07-19 DIAGNOSIS — F988 Other specified behavioral and emotional disorders with onset usually occurring in childhood and adolescence: Secondary | ICD-10-CM

## 2021-07-19 DIAGNOSIS — Z79899 Other long term (current) drug therapy: Secondary | ICD-10-CM | POA: Diagnosis not present

## 2021-07-19 DIAGNOSIS — R079 Chest pain, unspecified: Secondary | ICD-10-CM

## 2021-07-19 DIAGNOSIS — F419 Anxiety disorder, unspecified: Secondary | ICD-10-CM

## 2021-07-19 DIAGNOSIS — R41840 Attention and concentration deficit: Secondary | ICD-10-CM

## 2021-07-19 LAB — COMPREHENSIVE METABOLIC PANEL
ALT: 38 U/L — ABNORMAL HIGH (ref 0–35)
AST: 32 U/L (ref 0–37)
Albumin: 3.8 g/dL (ref 3.5–5.2)
Alkaline Phosphatase: 78 U/L (ref 39–117)
BUN: 13 mg/dL (ref 6–23)
CO2: 30 mEq/L (ref 19–32)
Calcium: 9 mg/dL (ref 8.4–10.5)
Chloride: 109 mEq/L (ref 96–112)
Creatinine, Ser: 0.75 mg/dL (ref 0.40–1.20)
GFR: 89.09 mL/min (ref >60.00)
Glucose, Bld: 83 mg/dL (ref 70–99)
Potassium: 4.7 mEq/L (ref 3.5–5.1)
Sodium: 142 mEq/L (ref 135–145)
Total Bilirubin: 0.5 mg/dL (ref 0.2–1.2)
Total Protein: 5.9 g/dL — ABNORMAL LOW (ref 6.0–8.3)

## 2021-07-19 LAB — LIPID PANEL
Cholesterol: 139 mg/dL (ref 0–200)
HDL: 59.8 mg/dL (ref >39.00)
LDL Cholesterol: 70 mg/dL (ref 0–99)
NonHDL: 78.97
Total CHOL/HDL Ratio: 2
Triglycerides: 45 mg/dL (ref 0.0–149.0)
VLDL: 9 mg/dL (ref 0.0–40.0)

## 2021-07-19 LAB — CBC WITH DIFFERENTIAL/PLATELET
Basophils Absolute: 0.1 10*3/uL (ref 0.0–0.1)
Basophils Relative: 1.8 % (ref 0.0–3.0)
Eosinophils Absolute: 0.3 10*3/uL (ref 0.0–0.7)
Eosinophils Relative: 7.3 % — ABNORMAL HIGH (ref 0.0–5.0)
HCT: 36.1 % (ref 36.0–46.0)
Hemoglobin: 12 g/dL (ref 12.0–15.0)
Lymphocytes Relative: 40.1 % (ref 12.0–46.0)
Lymphs Abs: 1.4 10*3/uL (ref 0.7–4.0)
MCHC: 33.3 g/dL (ref 30.0–36.0)
MCV: 90.7 fl (ref 78.0–100.0)
Monocytes Absolute: 0.3 10*3/uL (ref 0.1–1.0)
Monocytes Relative: 7.3 % (ref 3.0–12.0)
Neutro Abs: 1.5 10*3/uL (ref 1.4–7.7)
Neutrophils Relative %: 43.5 % (ref 43.0–77.0)
Platelets: 177 10*3/uL (ref 150.0–400.0)
RBC: 3.98 Mil/uL (ref 3.87–5.11)
RDW: 13.8 % (ref 11.5–15.5)
WBC: 3.5 10*3/uL — ABNORMAL LOW (ref 4.0–10.5)

## 2021-07-19 LAB — VITAMIN B12: Vitamin B-12: 363 pg/mL (ref 211–911)

## 2021-07-19 LAB — TSH: TSH: 3.83 u[IU]/mL (ref 0.35–5.50)

## 2021-07-19 NOTE — Progress Notes (Signed)
Subjective:   By signing my name below, I, [name], attest that this documentation has been prepared under the direction and in the presence of Mary Held, DO  07/19/2021    Patient ID: [name], female    DOB: Feb 06, 1965, 56 y.o.   MRN: [id number]  Chief Complaint  Patient presents with   ADD   Anxiety   Follow-up    Anxiety Symptoms include chest pain, dizziness (occasional) and shortness of breath (occasionally with chest pains).     Patient is in today for a follow up visit.   She reports having episodes of occasional dizziness if turns her head up or down. She continues having neck pain. She continues having back pain as well. She has a history of arthritis. She has seen an orthopedist to manage her symptoms but does not have a follow up with them. She has received injections in her back to manage her pain. She was also sent to a surgeon but she declined having a procedure. She was also discussing a nerve stimulator but she could not afford it.  She also complains of sporadic sharp pains in her chest that lasts a couple of seconds for the past couple of weeks. She reports they can cause her to lose her breath shortly. She reports having similar pains prior to her last visit here.    Past Medical History:  Diagnosis Date   Allergy    Anemia    Anxiety    Asthma    Depression    GERD (gastroesophageal reflux disease)    Heart murmur    from childhood   Hiatal hernia    Hypertension    Peptic ulcer    PTSD (post-traumatic stress disorder)    UTI (urinary tract infection)     Past Surgical History:  Procedure Laterality Date   ABDOMINAL HYSTERECTOMY     fibroids   BIOPSY  07/11/2018   Procedure: BIOPSY;  Surgeon: [name], MD;  Location: [facility];  Service: Endoscopy;;   COLONOSCOPY  2014   ESOPHAGOGASTRODUODENOSCOPY (EGD) WITH PROPOFOL N/A 07/11/2018   Procedure: ESOPHAGOGASTRODUODENOSCOPY (EGD) WITH PROPOFOL;  Surgeon: [name], MD;  Location: [facility];  Service: Endoscopy;  Laterality: N/A;   GASTRIC BYPASS  2007   HEMOSTASIS CLIP PLACEMENT  07/11/2018   Procedure: HEMOSTASIS CLIP PLACEMENT;  Surgeon: [name], MD;  Location: [facility];  Service: Endoscopy;;   LAPAROTOMY N/A 03/08/2018   Procedure: EXPLORATORY LAPAROTOMY, REPAIR AND PATCH CLOSURE OF PERFORATED ULCER;  Surgeon: Georganna Skeans, MD;  Location: [facility];  Service: General;  Laterality: N/A;   TUBAL LIGATION      Family History  Problem Relation Age of Onset   Stroke Mother    Alzheimer's disease Mother    Hypertension Father    Cancer Father        Prostate   Alcohol abuse Sister    Depression Sister    Alcohol abuse Brother    Depression Brother    Diabetes Maternal Grandfather    Liver disease Maternal Grandfather    Cancer Maternal Grandfather        Prostate   Diabetes Maternal Grandmother    Alzheimer's disease Maternal Grandmother    Diabetes Paternal Grandfather    Cancer Paternal Grandfather        Lung, Prostate   Diabetes Paternal Grandmother    Alzheimer's disease Paternal Grandmother    Colon cancer Neg Hx    Esophageal cancer Neg  Hx    Pancreatic cancer Neg Hx    Stomach cancer Neg Hx    Colon polyps Neg Hx    Rectal cancer Neg Hx     Social History   Socioeconomic History   Marital status: Married    Spouse name: mark   Number of children: 0   Years of education: Not on file   Highest education level: High school graduate  Occupational History    Employer: [employer]    Comment: [employer]  Tobacco Use   Smoking status: Former    Types: Cigarettes    Quit date: 01/24/1987    Years since quitting: 34.5   Smokeless tobacco: Never  Vaping Use   Vaping Use: Former   Substances: Flavoring   Devices: has vaped with flavor but no nicotine. Been 8 years  Substance and Sexual Activity   Alcohol use: Not Currently    Alcohol/week: 35.0 standard drinks of alcohol    Types: 35 Cans of beer  per week   Drug use: No   Sexual activity: Not Currently  Other Topics Concern   Not on file  Social History Narrative   Regular exercise--no (because of knees)   Social Determinants of Health   Financial Resource Strain: Not on file  Food Insecurity: Not on file  Transportation Needs: Not on file  Physical Activity: Not on file  Stress: Not on file  Social Connections: Not on file  Intimate Partner Violence: Not on file    Outpatient Medications Prior to Visit  Medication Sig Dispense Refill   Acetaminophen (TYLENOL 8 HOUR ARTHRITIS PAIN PO) Take 2 tablets by mouth as needed.     busPIRone (BUSPAR) 15 MG tablet Take 1 tablet (15 mg total) by mouth 2 (two) times daily. Take daily at 9 AM and at 4 PM 180 tablet 0   Cholecalciferol (VITAMIN D3) 50 MCG (2000 UT) capsule Take 1 capsule (2,000 Units total) by mouth daily. 100 capsule 0   colestipol (COLESTID) 1 g tablet Take 1 tablet (1 g total) by mouth 2 (two) times daily. 60 tablet 2   cyanocobalamin (,VITAMIN B-12,) 1000 MCG/ML injection Inject 1 mL (1,000 mcg total) into the muscle every 30 (thirty) days. 10 mL 0   cyclobenzaprine (FLEXERIL) 10 MG tablet Take 1 tablet (10 mg total) by mouth 3 (three) times daily as needed. for muscle spams 30 tablet 0   diazepam (VALIUM) 5 MG tablet Take 1 tablet (5 mg total) by mouth every 12 (twelve) hours as needed for anxiety. 30 tablet 0   Diclofenac Sodium 2 % SOLN Apply 1 pump twice daily as needed. 112 g 1   escitalopram (LEXAPRO) 10 MG tablet Take 1.5 tablets (15 mg total) by mouth daily. 135 tablet 0   estradiol (ESTRACE) 0.1 MG/GM vaginal cream Apply a pea-sized amount of cream to the fingertip and wipe in the front part of the vagina twice weekly 42.5 g 0   estradiol (VIVELLE-DOT) 0.1 MG/24HR patch Place 1 patch (0.1 mg total) onto the skin 2 (two) times a week. 8 patch 5   fluconazole (DIFLUCAN) 150 MG tablet Take 1 tablet by mouth for 1 day. May repeat in 3 days as needed 2 tablet 0    fluticasone (FLONASE) 50 MCG/ACT nasal spray Place 2 sprays into both nostrils daily. 16 g 5   furosemide (LASIX) 40 MG tablet Take 1 tablet (40 mg total) by mouth daily. 90 tablet 0   Homeopathic Products (ALLERGY MEDICINE PO) Take 1  tablet by mouth as needed (Takes the Clortabs. Equate Brand).     HYDROcodone-acetaminophen (NORCO/VICODIN) 5-325 MG tablet Take 1 tablet by mouth every 6 (six) hours as needed. 15 tablet 0   hydrOXYzine (VISTARIL) 25 MG capsule Take 1 capsule (25 mg total) by mouth 2 (two) times daily as needed for anxiety. And sleep, racing thoughts 60 capsule 1   loperamide (IMODIUM) 2 MG capsule Take 2 mg by mouth daily as needed.      omeprazole (PRILOSEC) 40 MG capsule TAKE 1 CAPSULE BY MOUTH TWICE DAILY. OPEN CAPSULE AND SPRINKLE ON APPLESAUCE. 180 capsule 0   Probiotic Product (ALIGN) CHEW Chew 2 tablets by mouth daily. Align pre and probiotic with vitamain C-take one tablet each Align pre and probiotic with Vitamin b12 for immune and energy-take one tablet each     rOPINIRole (REQUIP) 1 MG tablet TAKE 1 TABLET (1 MG TOTAL) BY MOUTH AT BEDTIME. 90 tablet 0   SYRINGE-NEEDLE, DISP, 3 ML (B-D 3CC LUER-LOK SYR 25GX5/8") 25G X 5/8" 3 ML MISC USE AS DIRECTED WITH B-12 INJECTION 16 each 0   VITAMIN A PO Take by mouth.     zolpidem (AMBIEN) 5 MG tablet Take 1 tablet (5 mg total) by mouth at bedtime as needed for sleep. 30 tablet 0   amphetamine-dextroamphetamine (ADDERALL) 20 MG tablet Take 1 tablet by mouth 3 times daily (Patient not taking: Reported on 06/13/2021) 90 tablet 0   No facility-administered medications prior to visit.    Allergies  Allergen Reactions   Penicillins Other (See Comments)    Did it involve swelling of the face/tongue/throat, SOB, or low BP? Unk Did it involve sudden or severe rash/hives, skin peeling, or any reaction on the inside of your mouth or nose? Unk Did you need to seek medical attention at a hospital or doctor's office? Unk When did it last  happen? Was 56 years old; reaction not recalled     If all above answers are "NO", may proceed with cephalosporin use.    Codeine Itching    Crazy dreams and itching-pt said she can take it with food    Review of Systems  Respiratory:  Positive for shortness of breath (occasionally with chest pains).   Cardiovascular:  Positive for chest pain.  Musculoskeletal:  Positive for back pain and neck pain.  Neurological:  Positive for dizziness (occasional).       Objective:    Physical Exam Constitutional:      General: She is not in acute distress.    Appearance: Normal appearance. She is not ill-appearing.  HENT:     Head: Normocephalic and atraumatic.     Right Ear: External ear normal.     Left Ear: External ear normal.  Eyes:     Extraocular Movements: Extraocular movements intact.     Pupils: Pupils are equal, round, and reactive to light.  Cardiovascular:     Rate and Rhythm: Normal rate and regular rhythm.     Heart sounds: Normal heart sounds. No murmur heard.    No gallop.  Pulmonary:     Effort: Pulmonary effort is normal. No respiratory distress.     Breath sounds: Normal breath sounds. No wheezing or rales.  Skin:    General: Skin is warm and dry.  Neurological:     Mental Status: She is alert and oriented to person, place, and time.  Psychiatric:        Judgment: Judgment normal.     BP 118/80 (BP  Location: Left Arm, Patient Position: Sitting, Cuff Size: Normal)   Pulse 67   Temp 98.1 F (36.7 C) (Oral)   Resp 18   Ht '5\' 2"'$  (1.575 m)   Wt 136 lb 6.4 oz (61.9 kg)   SpO2 98%   BMI 24.95 kg/m  Wt Readings from Last 3 Encounters:  07/19/21 136 lb 6.4 oz (61.9 kg)  06/07/21 130 lb (59 kg)  03/10/21 135 lb (61.2 kg)    Diabetic Foot Exam - Simple   No data filed    Lab Results  Component Value Date   WBC 5.0 01/31/2021   HGB 12.7 01/31/2021   HCT 38.6 01/31/2021   PLT 209.0 01/31/2021   GLUCOSE 89 01/31/2021   CHOL 191 09/23/2020   TRIG 61.0  09/23/2020   HDL 66.80 09/23/2020   LDLCALC 112 (H) 09/23/2020   ALT 16 01/31/2021   AST 21 01/31/2021   NA 141 01/31/2021   K 5.3 (H) 01/31/2021   CL 106 01/31/2021   CREATININE 0.99 01/31/2021   BUN 17 01/31/2021   CO2 29 01/31/2021   TSH 2.29 11/04/2020    Lab Results  Component Value Date   TSH 2.29 11/04/2020   Lab Results  Component Value Date   WBC 5.0 01/31/2021   HGB 12.7 01/31/2021   HCT 38.6 01/31/2021   MCV 92.8 01/31/2021   PLT 209.0 01/31/2021   Lab Results  Component Value Date   NA 141 01/31/2021   K 5.3 (H) 01/31/2021   CO2 29 01/31/2021   GLUCOSE 89 01/31/2021   BUN 17 01/31/2021   CREATININE 0.99 01/31/2021   BILITOT 0.3 01/31/2021   ALKPHOS 66 01/31/2021   AST 21 01/31/2021   ALT 16 01/31/2021   PROT 6.9 01/31/2021   ALBUMIN 4.3 01/31/2021   CALCIUM 9.2 01/31/2021   ANIONGAP 6 07/12/2018   GFR 64.05 01/31/2021   Lab Results  Component Value Date   CHOL 191 09/23/2020   Lab Results  Component Value Date   HDL 66.80 09/23/2020   Lab Results  Component Value Date   LDLCALC 112 (H) 09/23/2020   Lab Results  Component Value Date   TRIG 61.0 09/23/2020   Lab Results  Component Value Date   CHOLHDL 3 09/23/2020   No results found for: "HGBA1C"     Assessment & Plan:   Problem List Items Addressed This Visit       Other   Anxiety   Other Visit Diagnoses     High risk medication use    -  Primary   Relevant Orders   Drug Monitoring Panel [phone] , Urine   Attention deficit disorder (ADD) without hyperactivity            No orders of the defined types were placed in this encounter.   I, Mary Reeves [place], personally preformed the services described in this documentation.  All medical record entries made by the scribe were at my direction and in my presence.  I have reviewed the chart and discharge instructions (if applicable) and agree that the record reflects my personal performance and is accurate and complete.  07/19/2021   I,[name],acting as a Education administrator for [employer], DO.,have documented all relevant documentation on the behalf of Mary Held, DO,as directed by  Mary Held, DO while in the presence of Mary Held, DO.   Mary [employer]

## 2021-07-19 NOTE — Progress Notes (Signed)
Established Patient Office Visit  Subjective   Patient ID: [name], female    DOB: 1965/03/27  Age: 56 y.o. MRN: [id number]  Chief Complaint  Patient presents with   ADD   Anxiety   Follow-up    HPI  Patient Active Problem List   Diagnosis Date Noted   PTSD (post-traumatic stress disorder) 04/04/2021   MDD (major depressive disorder), recurrent episode, moderate ([place]) 04/04/2021   Social anxiety disorder 04/04/2021   Dizziness 11/04/2020   Epistaxis 11/04/2020   Chest pain 11/04/2020   Anxiety 09/23/2020   Attention and concentration deficit 09/23/2020   Degenerative spondylolisthesis 04/05/2020   Low back pain with radiation 02/06/2020   Bilateral lower extremity edema 04/07/2019   Pain in both lower extremities 04/07/2019   Numbness in both hands 04/07/2019   Pain in right arm 04/07/2019   Recurrent urinary tract infection 10/24/2018   Menopausal syndrome 10/24/2018   Abnormal mammogram 10/24/2018   Abdominal pain, epigastric    Marginal ulcers    Melena 07/10/2018   Alcohol use disorder, severe, in sustained remission ([place]) 07/10/2018   Encounter for screening for HIV 07/10/2018   Asthma 07/10/2018   Exposure to COVID-19 virus 05/28/2018   GERD (gastroesophageal reflux disease) 05/28/2018   Free intraperitoneal air 03/08/2018   Perforated ulcer ([place]) 03/08/2018   Cervical radiculopathy at C7 02/22/2018   Essential hypertension 08/02/2017   Hematoma of hip, left, initial encounter 06/28/2017   Neck pain 06/28/2017   Nonallopathic lesion of cervical region 06/28/2017   Nonallopathic lesion of thoracic region 06/28/2017   Nonallopathic lesion of lumbar region 06/28/2017   Biomechanical lesion, unspecified 06/28/2017   Muscle spasm 04/20/2017   URI (upper respiratory infection) 10/02/2016   Preventative health care 05/01/2016   Piriformis syndrome, right 11/15/2015   Degenerative arthritis of knee, bilateral 06/02/2015   Patellofemoral arthritis of left  knee 05/04/2015   Cough 09/02/2013   Acute pharyngitis 09/02/2013   Sinusitis 06/25/2013   Dog bite of face 04/16/2013   Gastroenteritis 04/16/2013   Allergic rhinitis 04/16/2013   Mass of soft tissue 08/02/2012   RLS (restless legs syndrome) 01/21/2009   LYMPHOPENIA 03/23/2008   Depression 02/27/2008   HEMANGIOMA, SKIN 02/04/2008   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 02/19/2007   Past Medical History:  Diagnosis Date   Allergy    Anemia    Anxiety    Asthma    Depression    GERD (gastroesophageal reflux disease)    Heart murmur    from childhood   Hiatal hernia    Hypertension    Peptic ulcer    PTSD (post-traumatic stress disorder)    UTI (urinary tract infection)    Past Surgical History:  Procedure Laterality Date   ABDOMINAL HYSTERECTOMY     fibroids   BIOPSY  07/11/2018   Procedure: BIOPSY;  Surgeon: [name], MD;  Location[facility] ENDOSCOPY;  Service: Endoscopy;;   COLONOSCOPY  2014   ESOPHAGOGASTRODUODENOSCOPY (EGD) WITH PROPOFOL N/A 07/11/2018   Procedure: ESOPHAGOGASTRODUODENOSCOPY (EGD) WITH PROPOFOL;  Surgeon: [name], MD;  Location: [facility] ENDOSCOPY;  Service: Endoscopy;  Laterality: N/A;   GASTRIC BYPASS  2007   HEMOSTASIS CLIP PLACEMENT  07/11/2018   Procedure: HEMOSTASIS CLIP PLACEMENT;  Surgeon: [name], MD;  Location: [place];  Service: Endoscopy;;   LAPAROTOMY N/A 03/08/2018   Procedure: EXPLORATORY LAPAROTOMY, REPAIR AND PATCH CLOSURE OF PERFORATED ULCER;  Surgeon: Georganna Skeans, MD;  Location: [place];  Service: General;  Laterality: N/A;   TUBAL LIGATION     Social History   Tobacco Use   Smoking status: Former    Types: Cigarettes    Quit date: 01/24/1987    Years since quitting: 34.5   Smokeless tobacco: Never  Vaping Use   Vaping Use: Former   Substances: Flavoring   Devices: has vaped with flavor but no nicotine. Been 8 years  Substance Use Topics   Alcohol use: Not Currently    Alcohol/week: 35.0 standard drinks  of alcohol    Types: 35 Cans of beer per week   Drug use: No   Social History   Socioeconomic History   Marital status: Married    Spouse name: mark   Number of children: 0   Years of education: Not on file   Highest education level: High school graduate  Occupational History    Employer: [employer]    Comment: [employer]  Tobacco Use   Smoking status: Former    Types: Cigarettes    Quit date: 01/24/1987    Years since quitting: 34.5   Smokeless tobacco: Never  Vaping Use   Vaping Use: Former   Substances: Flavoring   Devices: has vaped with flavor but no nicotine. Been 8 years  Substance and Sexual Activity   Alcohol use: Not Currently    Alcohol/week: 35.0 standard drinks of alcohol    Types: 35 Cans of beer per week   Drug use: No   Sexual activity: Not Currently  Other Topics Concern   Not on file  Social History Narrative   Regular exercise--no (because of knees)   Social Determinants of Health   Financial Resource Strain: Not on file  Food Insecurity: Not on file  Transportation Needs: Not on file  Physical Activity: Not on file  Stress: Not on file  Social Connections: Not on file  Intimate Partner Violence: Not on file   Family Status  Relation Name Status   Mother  Deceased   Father  Alive   Sister  Alive   Brother  Alive   MGF  (Not Specified)   MGM  (Not Specified)   PGF  (Not Specified)   PGM  (Not Specified)   Neg Hx  (Not Specified)   Family History  Problem Relation Age of Onset   Stroke Mother    Alzheimer's disease Mother    Hypertension Father    Cancer Father        Prostate   Alcohol abuse Sister    Depression Sister    Alcohol abuse Brother    Depression Brother    Diabetes Maternal Grandfather    Liver disease Maternal Grandfather    Cancer Maternal Grandfather        Prostate   Diabetes Maternal Grandmother    Alzheimer's disease Maternal Grandmother    Diabetes Paternal Grandfather    Cancer Paternal Grandfather         Lung, Prostate   Diabetes Paternal Grandmother    Alzheimer's disease Paternal Grandmother    Colon cancer Neg Hx    Esophageal cancer Neg Hx    Pancreatic cancer Neg Hx    Stomach cancer Neg Hx    Colon polyps Neg Hx    Rectal cancer Neg Hx    Allergies  Allergen Reactions   Penicillins Other (See Comments)    Did it involve swelling of the face/tongue/throat, SOB, or low BP? Unk Did it involve sudden or severe rash/hives, skin peeling, or any reaction on  the inside of your mouth or nose? Unk Did you need to seek medical attention at a hospital or doctor's office? Unk When did it last happen? Was 56 years old; reaction not recalled     If all above answers are "NO", may proceed with cephalosporin use.    Codeine Itching    Crazy dreams and itching-pt said she can take it with food      Review of Systems  Constitutional:  Negative for fever and malaise/fatigue.  HENT:  Negative for congestion.   Eyes:  Negative for blurred vision.  Respiratory:  Negative for shortness of breath.   Cardiovascular:  Negative for chest pain, palpitations and leg swelling.  Gastrointestinal:  Negative for abdominal pain, blood in stool and nausea.  Genitourinary:  Negative for dysuria and frequency.  Musculoskeletal:  Negative for falls.  Skin:  Negative for rash.  Neurological:  Negative for dizziness, loss of consciousness and headaches.  Endo/Heme/Allergies:  Negative for environmental allergies.  Psychiatric/Behavioral:  Negative for depression. The patient is not nervous/anxious.       Objective:     BP 118/80 (BP Location: Left Arm, Patient Position: Sitting, Cuff Size: Normal)   Pulse 67   Temp 98.1 F (36.7 C) (Oral)   Resp 18   Ht '5\' 2"'$  (1.575 m)   Wt 136 lb 6.4 oz (61.9 kg)   SpO2 98%   BMI 24.95 kg/m  BP Readings from Last 3 Encounters:  07/19/21 118/80  06/07/21 (!) 144/80  03/10/21 125/70   Wt Readings from Last 3 Encounters:  07/19/21 136 lb 6.4 oz (61.9 kg)   06/07/21 130 lb (59 kg)  03/10/21 135 lb (61.2 kg)   SpO2 Readings from Last 3 Encounters:  07/19/21 98%  02/15/21 98%  01/31/21 99%      Physical Exam Vitals and nursing note reviewed.  Constitutional:      Appearance: She is well-developed.  HENT:     Head: Normocephalic and atraumatic.  Eyes:     Conjunctiva/sclera: Conjunctivae normal.  Neck:     Thyroid: No thyromegaly.     Vascular: No carotid bruit or JVD.  Cardiovascular:     Rate and Rhythm: Normal rate and regular rhythm.     Heart sounds: Normal heart sounds. No murmur heard. Pulmonary:     Effort: Pulmonary effort is normal. No respiratory distress.     Breath sounds: Normal breath sounds. No wheezing or rales.  Chest:     Chest wall: No tenderness.  Musculoskeletal:     Cervical back: Normal range of motion and neck supple.  Neurological:     Mental Status: She is alert and oriented to person, place, and time.      Results for orders placed or performed in visit on 07/19/21  Drug Monitoring Panel [id number] , Urine  Result Value Ref Range   [facility] Summary     Amphetamines NEGATIVE <500 ng/mL   Barbiturates NEGATIVE <300 ng/mL   Benzodiazepines NEGATIVE <100 ng/mL   Cocaine Metabolite NEGATIVE <150 ng/mL   Opiates POSITIVE (A) <100 ng/mL   Codeine NEGATIVE <50 ng/mL   Hydrocodone NEGATIVE <50 ng/mL   Hydromorphone NEGATIVE <50 ng/mL   Morphine NEGATIVE <50 ng/mL   Norhydrocodone 136 (H) <50 ng/mL   medMATCH Norhydrocodone INCONSISTENT (A)    Opiates Comments     Oxycodone NEGATIVE <100 ng/mL   Desmethyltramadol NEGATIVE <100 ng/mL   Tramadol NEGATIVE <100 ng/mL   Tramadol Comments    CBC with Differential/Platelet  Result Value Ref Range   WBC 3.5 (L) 4.0 - 10.5 K/uL   RBC 3.98 3.87 - 5.11 Mil/uL   Hemoglobin 12.0 12.0 - 15.0 g/dL   HCT 36.1 36.0 - 46.0 %   MCV 90.7 78.0 - 100.0 fl   MCHC 33.3 30.0 - 36.0 g/dL   RDW 13.8 11.5 - 15.5 %   Platelets 177.0 150.0 - 400.0 K/uL   Neutrophils  Relative % 43.5 43.0 - 77.0 %   Lymphocytes Relative 40.1 12.0 - 46.0 %   Monocytes Relative 7.3 3.0 - 12.0 %   Eosinophils Relative 7.3 (H) 0.0 - 5.0 %   Basophils Relative 1.8 0.0 - 3.0 %   Neutro Abs 1.5 1.4 - 7.7 K/uL   Lymphs Abs 1.4 0.7 - 4.0 K/uL   Monocytes Absolute 0.3 0.1 - 1.0 K/uL   Eosinophils Absolute 0.3 0.0 - 0.7 K/uL   Basophils Absolute 0.1 0.0 - 0.1 K/uL  Comprehensive metabolic panel  Result Value Ref Range   Sodium 142 135 - 145 mEq/L   Potassium 4.7 3.5 - 5.1 mEq/L   Chloride 109 96 - 112 mEq/L   CO2 30 19 - 32 mEq/L   Glucose, Bld 83 70 - 99 mg/dL   BUN 13 6 - 23 mg/dL   Creatinine, Ser 0.75 0.40 - 1.20 mg/dL   Total Bilirubin 0.5 0.2 - 1.2 mg/dL   Alkaline Phosphatase 78 39 - 117 U/L   AST 32 0 - 37 U/L   ALT 38 (H) 0 - 35 U/L   Total Protein 5.9 (L) 6.0 - 8.3 g/dL   Albumin 3.8 3.5 - 5.2 g/dL   GFR 89.09 >60.00 mL/min   Calcium 9.0 8.4 - 10.5 mg/dL  Lipid panel  Result Value Ref Range   Cholesterol 139 0 - 200 mg/dL   Triglycerides 45.0 0.0 - 149.0 mg/dL   HDL 59.80 >39.00 mg/dL   VLDL 9.0 0.0 - 40.0 mg/dL   LDL Cholesterol 70 0 - 99 mg/dL   Total CHOL/HDL Ratio 2    NonHDL 78.97   TSH  Result Value Ref Range   TSH 3.83 0.35 - 5.50 uIU/mL  Vitamin B12  Result Value Ref Range   Vitamin B-12 363 211 - 911 pg/mL  DM TEMPLATE  Result Value Ref Range   Notes and Comments      Last CBC Lab Results  Component Value Date   WBC 3.5 (L) 07/19/2021   HGB 12.0 07/19/2021   HCT 36.1 07/19/2021   MCV 90.7 07/19/2021   MCH 29.0 08/27/2018   RDW 13.8 07/19/2021   PLT 177.0 30/07/6224   Last metabolic panel Lab Results  Component Value Date   GLUCOSE 83 07/19/2021   NA 142 07/19/2021   K 4.7 07/19/2021   CL 109 07/19/2021   CO2 30 07/19/2021   BUN 13 07/19/2021   CREATININE 0.75 07/19/2021   GFRNONAA 107 08/27/2018   CALCIUM 9.0 07/19/2021   PHOS 3.8 01/29/2009   PROT 5.9 (L) 07/19/2021   ALBUMIN 3.8 07/19/2021   LABGLOB 2.1 08/27/2018    AGRATIO 1.9 08/27/2018   BILITOT 0.5 07/19/2021   ALKPHOS 78 07/19/2021   AST 32 07/19/2021   ALT 38 (H) 07/19/2021   ANIONGAP 6 07/12/2018   Last lipids Lab Results  Component Value Date   CHOL 139 07/19/2021   HDL 59.80 07/19/2021   LDLCALC 70 07/19/2021   TRIG 45.0 07/19/2021   CHOLHDL 2 07/19/2021   Last hemoglobin A1c No results  found for: "HGBA1C" Last thyroid functions Lab Results  Component Value Date   TSH 3.83 07/19/2021   Last vitamin D Lab Results  Component Value Date   VD25OH 38.79 11/04/2020   Last vitamin B12 and Folate Lab Results  Component Value Date   VITAMINB12 363 07/19/2021   FOLATE >23.7 04/07/2019      The 10-year ASCVD risk score (Arnett DK, et al., 2019) is: 1.6%    Assessment & Plan:   Problem List Items Addressed This Visit       Unprioritized   Chest pain - Primary   Relevant Orders   Ambulatory referral to Cardiology   CBC with Differential/Platelet (Completed)   Comprehensive metabolic panel (Completed)   Lipid panel (Completed)   TSH (Completed)   Vitamin B12 (Completed)   Attention and concentration deficit    Stable       Anxiety    Stable con't meds      Relevant Orders   CBC with Differential/Platelet (Completed)   Comprehensive metabolic panel (Completed)   Lipid panel (Completed)   TSH (Completed)   Vitamin B12 (Completed)   Other Visit Diagnoses     High risk medication use       Relevant Orders   Drug Monitoring Panel [phone] , Urine (Completed)   Attention deficit disorder (ADD) without hyperactivity           No follow-ups on file.    [name], DO

## 2021-07-21 LAB — DM TEMPLATE

## 2021-07-21 LAB — DRUG MONITORING PANEL 376104, URINE
Amphetamines: NEGATIVE ng/mL (ref <500)
Barbiturates: NEGATIVE ng/mL (ref <300)
Benzodiazepines: NEGATIVE ng/mL (ref <100)
Cocaine Metabolite: NEGATIVE ng/mL (ref <150)
Codeine: NEGATIVE ng/mL (ref <50)
Desmethyltramadol: NEGATIVE ng/mL (ref <100)
Hydrocodone: NEGATIVE ng/mL (ref <50)
Hydromorphone: NEGATIVE ng/mL (ref <50)
Morphine: NEGATIVE ng/mL (ref <50)
Norhydrocodone: 136 ng/mL — ABNORMAL HIGH (ref <50)
Opiates: POSITIVE ng/mL — AB (ref <100)
Oxycodone: NEGATIVE ng/mL (ref <100)
Tramadol: NEGATIVE ng/mL (ref <100)

## 2021-07-22 ENCOUNTER — Encounter: Payer: Self-pay | Admitting: Family Medicine

## 2021-07-22 NOTE — Assessment & Plan Note (Signed)
Stable

## 2021-07-22 NOTE — Assessment & Plan Note (Signed)
Stable con't meds 

## 2021-07-29 ENCOUNTER — Ambulatory Visit (INDEPENDENT_AMBULATORY_CARE_PROVIDER_SITE_OTHER): Payer: 59 | Admitting: Licensed Clinical Social Worker

## 2021-07-29 DIAGNOSIS — Z634 Disappearance and death of family member: Secondary | ICD-10-CM

## 2021-07-29 DIAGNOSIS — F4310 Post-traumatic stress disorder, unspecified: Secondary | ICD-10-CM
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
## 2021-07-29 NOTE — Plan of Care (Signed)
  Problem: Reduce the negative impact trauma related symptoms have on social, occupational, and family functioning per pt self report 3 out of 5 sessions documented. PTSD-Trauma Disorder CCP Problem  1  Goal: LTG: Reduce frequency, intensity, and duration of PTSD symptoms so daily functioning is improved: Input needed on appropriate metric.  per pt self report Outcome: Progressing Goal: STG: Increase participation in previously avoided activities 3 out of 5 sessions documented Outcome: Progressing Intervention: REVIEW PLEASE SKILLS (TREAT PHYSICAL ILLNESS, BALANCE EATING, AVOID MOOD-ALTERING SUBSTANCES, BALANCE SLEEP AND GET EXERCISE) WITH [name] Note: Reviewed--pt doing well with this Intervention: Assess emotional status and coping mechanisms Note: Assessed-pt stable currently. Continuing to work on emotion regulation Intervention: Encourage compliance with prescribed medication regimen Note: Pt reports current compliance

## 2021-08-01 ENCOUNTER — Ambulatory Visit: Payer: 59 | Admitting: Family Medicine

## 2021-08-01 ENCOUNTER — Other Ambulatory Visit: Payer: Self-pay | Admitting: Psychiatry

## 2021-08-01 ENCOUNTER — Other Ambulatory Visit: Payer: Self-pay | Admitting: Family Medicine

## 2021-08-01 ENCOUNTER — Other Ambulatory Visit: Payer: Self-pay

## 2021-08-01 DIAGNOSIS — F4310 Post-traumatic stress disorder, unspecified: Secondary | ICD-10-CM

## 2021-08-01 DIAGNOSIS — M79604 Pain in right leg: Secondary | ICD-10-CM

## 2021-08-01 DIAGNOSIS — F331 Major depressive disorder, recurrent, moderate: Secondary | ICD-10-CM

## 2021-08-01 DIAGNOSIS — F4010 Social phobia, unspecified: Secondary | ICD-10-CM

## 2021-08-01 MED ORDER — HYDROXYZINE PAMOATE 25 MG PO CAPS
25.0000 mg | ORAL_CAPSULE | Freq: Two times a day (BID) | ORAL | 1 refills | Status: DC | PRN
  Filled 2021-08-01: qty 60, 30d supply, fill #0

## 2021-08-01 MED ORDER — HYDROCODONE-ACETAMINOPHEN 5-325 MG PO TABS
1.0000 | ORAL_TABLET | Freq: Four times a day (QID) | ORAL | 0 refills | Status: DC | PRN
  Filled 2021-08-01: qty 15, 4d supply, fill #0

## 2021-08-01 MED ORDER — FUROSEMIDE 40 MG PO TABS
40.0000 mg | ORAL_TABLET | Freq: Every day | ORAL | 1 refills | Status: DC
  Filled 2021-08-01: qty 90, 90d supply, fill #0
  Filled 2021-10-26: qty 90, 90d supply, fill #1

## 2021-08-01 NOTE — Telephone Encounter (Signed)
Requesting: hydrocodone 5/'325mg'$  Contract: 10/07/20 UDS: 07/19/21 Last Visit: 07/19/21 Next Visit: none Last Refill: 05/03/21  Please Advise

## 2021-08-03 ENCOUNTER — Other Ambulatory Visit: Payer: Self-pay

## 2021-08-03 ENCOUNTER — Encounter: Payer: Self-pay | Admitting: Psychiatry

## 2021-08-03 ENCOUNTER — Ambulatory Visit (INDEPENDENT_AMBULATORY_CARE_PROVIDER_SITE_OTHER): Payer: 59 | Admitting: Psychiatry

## 2021-08-03 ENCOUNTER — Other Ambulatory Visit (HOSPITAL_BASED_OUTPATIENT_CLINIC_OR_DEPARTMENT_OTHER): Payer: Self-pay

## 2021-08-03 VITALS — BP 129/88 | HR 84 | Temp 97.6°F | Wt 136.2 lb

## 2021-08-03 DIAGNOSIS — F1021 Alcohol dependence, in remission: Secondary | ICD-10-CM

## 2021-08-03 DIAGNOSIS — F4310 Post-traumatic stress disorder, unspecified: Secondary | ICD-10-CM

## 2021-08-03 DIAGNOSIS — F4010 Social phobia, unspecified: Secondary | ICD-10-CM

## 2021-08-03 DIAGNOSIS — G2581 Restless legs syndrome: Secondary | ICD-10-CM

## 2021-08-03 DIAGNOSIS — F331 Major depressive disorder, recurrent, moderate: Secondary | ICD-10-CM | POA: Diagnosis not present

## 2021-08-03 MED ORDER — ZOLPIDEM TARTRATE 5 MG PO TABS
5.0000 mg | ORAL_TABLET | Freq: Every evening | ORAL | 1 refills | Status: DC | PRN
  Filled 2021-08-03 – 2021-08-08 (×2): qty 30, 30d supply, fill #0
  Filled 2021-12-18: qty 30, 30d supply, fill #1

## 2021-08-03 MED ORDER — LAMOTRIGINE 25 MG PO TABS
25.0000 mg | ORAL_TABLET | Freq: Every day | ORAL | 1 refills | Status: DC
  Filled 2021-08-03 (×2): qty 30, 30d supply, fill #0
  Filled 2021-09-05: qty 30, 30d supply, fill #1

## 2021-08-03 MED ORDER — BUSPIRONE HCL 15 MG PO TABS
15.0000 mg | ORAL_TABLET | Freq: Two times a day (BID) | ORAL | 0 refills | Status: DC
  Filled 2021-08-03 – 2021-09-16 (×2): qty 180, 90d supply, fill #0
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
## 2021-08-03 NOTE — Patient Instructions (Signed)
Lamotrigine Tablets What is this medication? LAMOTRIGINE (la MOE tri jeen) prevents and controls seizures in people with epilepsy. It may also be used to treat bipolar disorder. It works by calming overactive nerves in your body. This medicine may be used for other purposes; ask your health care provider or pharmacist if you have questions. COMMON BRAND NAME(S): Lamictal, Subvenite What should I tell my care team before I take this medication? They need to know if you have any of these conditions: Heart disease History of irregular heartbeat Immune system problems Kidney disease Liver disease Low levels of folic acid in the blood Lupus Mental illness Suicidal thoughts, plans, or attempt; a previous suicide attempt by you or a family member An unusual or allergic reaction to lamotrigine or other seizure medications, other medications, foods, dyes, or preservatives Pregnant or trying to get pregnant Breast-feeding How should I use this medication? Take this medication by mouth with a glass of water. Follow the directions on the prescription label. Do not chew these tablets. If this medication upsets your stomach, take it with food or milk. Take your doses at regular intervals. Do not take your medication more often than directed. A special MedGuide will be given to you by the pharmacist with each new prescription and refill. Be sure to read this information carefully each time. Talk to your care team about the use of this medication in children. While this medication may be prescribed for children as young as 2 years for selected conditions, precautions do apply. Overdosage: If you think you have taken too much of this medicine contact a poison control center or emergency room at once. NOTE: This medicine is only for you. Do not share this medicine with others. What if I miss a dose? If you miss a dose, take it as soon as you can. If it is almost time for your next dose, take only that dose.  Do not take double or extra doses. What may interact with this medication? Atazanavir Birth control pills Certain medications for irregular heartbeat Certain medications for seizures like carbamazepine, phenobarbital, phenytoin, primidone, valproic acid Lopinavir Rifampin Ritonavir This list may not describe all possible interactions. Give your health care provider a list of all the medicines, herbs, non-prescription drugs, or dietary supplements you use. Also tell them if you smoke, drink alcohol, or use illegal drugs. Some items may interact with your medicine. What should I watch for while using this medication? Visit your care team for regular checks on your progress. If you take this medication for seizures, wear a Medic Alert bracelet or necklace. Carry an identification card with information about your condition, medications, and care team. It is important to take this medication exactly as directed. When first starting treatment, your dose will need to be adjusted slowly. It may take weeks or months before your dose is stable. You should contact your care team if your seizures get worse or if you have any new types of seizures. Do not stop taking this medication unless instructed by your care team. Stopping your medication suddenly can increase your seizures or their severity. This medication may cause serious skin reactions. They can happen weeks to months after starting the medication. Contact your care team right away if you notice fevers or flu-like symptoms with a rash. The rash may be red or purple and then turn into blisters or peeling of the skin. Or, you might notice a red rash with swelling of the face, lips or lymph nodes in your   neck or under your arms. You may get drowsy, dizzy, or have blurred vision. Do not drive, use machinery, or do anything that needs mental alertness until you know how this medication affects you. To reduce dizzy or fainting spells, do not sit or stand up  quickly, especially if you are an older patient. Alcohol can increase drowsiness and dizziness. Avoid alcoholic drinks. If you are taking this medication for bipolar disorder, it is important to report any changes in your mood to your care team. If your condition gets worse, you get mentally depressed, feel very hyperactive or manic, have difficulty sleeping, or have thoughts of hurting yourself or committing suicide, you need to get help from your care team right away. If you are a caregiver for someone taking this medication for bipolar disorder, you should also report these behavioral changes right away. The use of this medication may increase the chance of suicidal thoughts or actions. Pay special attention to how you are responding while on this medication. Your mouth may get dry. Chewing sugarless gum or sucking hard candy, and drinking plenty of water may help. Contact your care team if the problem does not go away or is severe. Women who become pregnant while using this medication may enroll in the [place] Pregnancy Registry by calling ([phone]. This registry collects information about the safety of antiepileptic medication use during pregnancy. This medication may cause a decrease in folic acid. You should make sure that you get enough folic acid while you are taking this medication. Discuss the foods you eat and the vitamins you take with your care team. What side effects may I notice from receiving this medication? Side effects that you should report to your care team as soon as possible: Allergic reactions--skin rash, itching, hives, swelling of the face, lips, tongue, or throat Change in vision Fever, neck pain or stiffness, sensitivity to light, headache, nausea, vomiting, confusion Heart rhythm changes--fast or irregular heartbeat, dizziness, feeling faint or lightheaded, chest pain, trouble breathing Infection--fever, chills, cough, or sore throat Liver  injury--right upper belly pain, loss of appetite, nausea, light-colored stool, dark yellow or brown urine, yellowing skin or eyes, unusual weakness or fatigue Low red blood cell count--unusual weakness or fatigue, dizziness, headache, trouble breathing Rash, fever, and swollen lymph nodes Redness, blistering, peeling or loosening of the skin, including inside the mouth Thoughts of suicide or self-harm, worsening mood, or feelings of depression Unusual bruising or bleeding Side effects that usually do not require medical attention (report to your care team if they continue or are bothersome): Diarrhea Dizziness Drowsiness Headache Nausea Stomach pain Tremors or shaking This list may not describe all possible side effects. Call your doctor for medical advice about side effects. You may report side effects to FDA at 1-800-FDA-1088. Where should I keep my medication? Keep out of the reach of children and pets. Store at [employer] C (77 degrees F). Protect from light. Get rid of any unused medication after the expiration date. To get rid of medications that are no longer needed or have expired: Take the medication to a medication take-back program. Check with your pharmacy or law enforcement to find a location. If you cannot return the medication, check the label or package insert to see if the medication should be thrown out in the garbage or flushed down the toilet. If you are not sure, ask your care team. If it is safe to put it in the trash, empty the medication out of the  container. Mix the medication with cat litter, dirt, coffee grounds, or other unwanted substance. Seal the mixture in a bag or container. Put it in the trash. NOTE: This sheet is a summary. It may not cover all possible information. If you have questions about this medicine, talk to your doctor, pharmacist, or health care provider.  2023 Elsevier/Gold Standard (2020-12-10 00:00:00)

## 2021-08-08 ENCOUNTER — Other Ambulatory Visit (HOSPITAL_BASED_OUTPATIENT_CLINIC_OR_DEPARTMENT_OTHER): Payer: Self-pay

## 2021-08-09 ENCOUNTER — Other Ambulatory Visit: Payer: Self-pay

## 2021-08-19 ENCOUNTER — Other Ambulatory Visit: Payer: Self-pay

## 2021-08-22 ENCOUNTER — Other Ambulatory Visit: Payer: Self-pay

## 2021-08-24 ENCOUNTER — Telehealth: Payer: Self-pay | Admitting: Psychiatry

## 2021-08-24 ENCOUNTER — Other Ambulatory Visit: Payer: Self-pay

## 2021-08-24 DIAGNOSIS — F4010 Social phobia, unspecified: Secondary | ICD-10-CM

## 2021-08-24 DIAGNOSIS — F4310 Post-traumatic stress disorder, unspecified: Secondary | ICD-10-CM

## 2021-08-24 DIAGNOSIS — F331 Major depressive disorder, recurrent, moderate: Secondary | ICD-10-CM

## 2021-08-24 MED ORDER — ESCITALOPRAM OXALATE 5 MG PO TABS
5.0000 mg | ORAL_TABLET | Freq: Every day | ORAL | 2 refills | Status: DC
  Filled 2021-08-24: qty 30, 30d supply, fill #0

## 2021-08-24 MED ORDER — ESCITALOPRAM OXALATE 10 MG PO TABS
10.0000 mg | ORAL_TABLET | Freq: Every day | ORAL | 2 refills | Status: DC
  Filled 2021-08-24: qty 30, 30d supply, fill #0

## 2021-08-24 NOTE — Telephone Encounter (Signed)
Received dosage change request from pharmacy since patient's insurance will only cover 10 mg 30 tablets for 30 days. Changed to Lexapro 10 mg to take along with Lexapro 5 mg daily-total of 15 mg daily-sent to [facility] pharmacy.

## 2021-08-30 ENCOUNTER — Other Ambulatory Visit: Payer: Self-pay

## 2021-09-05 ENCOUNTER — Ambulatory Visit (INDEPENDENT_AMBULATORY_CARE_PROVIDER_SITE_OTHER): Payer: 59 | Admitting: Licensed Clinical Social Worker

## 2021-09-05 ENCOUNTER — Telehealth: Payer: Self-pay | Admitting: Internal Medicine

## 2021-09-05 ENCOUNTER — Other Ambulatory Visit: Payer: Self-pay

## 2021-09-05 DIAGNOSIS — F4310 Post-traumatic stress disorder, unspecified: Secondary | ICD-10-CM | POA: Diagnosis not present

## 2021-09-05 DIAGNOSIS — F331 Major depressive disorder, recurrent, moderate: Secondary | ICD-10-CM
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
## 2021-09-05 NOTE — Telephone Encounter (Signed)
LVM to reschedule.

## 2021-09-06 NOTE — Plan of Care (Signed)
  Problem: Reduce the negative impact trauma related symptoms have on social, occupational, and family functioning per pt self report 3 out of 5 sessions documented. PTSD-Trauma Disorder CCP Problem  1  Goal: LTG: Reduce frequency, intensity, and duration of PTSD symptoms so daily functioning is improved: Input needed on appropriate metric.  per pt self report Outcome: Progressing Goal: STG: Increase participation in previously avoided activities 3 out of 5 sessions documented Outcome: Progressing

## 2021-09-09 ENCOUNTER — Ambulatory Visit: Payer: 59 | Admitting: Internal Medicine

## 2021-09-15 ENCOUNTER — Ambulatory Visit (INDEPENDENT_AMBULATORY_CARE_PROVIDER_SITE_OTHER): Payer: 59 | Admitting: Psychiatry

## 2021-09-15 ENCOUNTER — Other Ambulatory Visit: Payer: Self-pay

## 2021-09-15 ENCOUNTER — Encounter: Payer: Self-pay | Admitting: Psychiatry

## 2021-09-15 VITALS — BP 108/69 | HR 65 | Temp 98.5°F | Wt 144.8 lb

## 2021-09-15 DIAGNOSIS — F331 Major depressive disorder, recurrent, moderate: Secondary | ICD-10-CM

## 2021-09-15 DIAGNOSIS — F4010 Social phobia, unspecified: Secondary | ICD-10-CM

## 2021-09-15 DIAGNOSIS — F4310 Post-traumatic stress disorder, unspecified: Secondary | ICD-10-CM

## 2021-09-15 DIAGNOSIS — G2581 Restless legs syndrome: Secondary | ICD-10-CM | POA: Diagnosis not present

## 2021-09-15 DIAGNOSIS — F1021 Alcohol dependence, in remission: Secondary | ICD-10-CM

## 2021-09-15 MED ORDER — ESCITALOPRAM OXALATE 20 MG PO TABS
20.0000 mg | ORAL_TABLET | Freq: Every day | ORAL | 0 refills | Status: DC
  Filled 2021-09-15: qty 90, 90d supply, fill #0

## 2021-09-15 MED ORDER — HYDROXYZINE PAMOATE 25 MG PO CAPS
25.0000 mg | ORAL_CAPSULE | Freq: Two times a day (BID) | ORAL | 1 refills | Status: DC | PRN
  Filled 2021-09-15: qty 60, 30d supply, fill #0

## 2021-09-15 MED ORDER — LAMOTRIGINE 25 MG PO TABS
25.0000 mg | ORAL_TABLET | Freq: Every day | ORAL | 0 refills | Status: DC
  Filled 2021-09-15 – 2021-10-11 (×2): qty 90, 90d supply, fill #0

## 2021-09-15 NOTE — Progress Notes (Signed)
[facility] OP Progress Note  09/15/2021 6:00 PM [name]  MRN:  [id number]  Chief Complaint:  Chief Complaint  Patient presents with   Follow-up: 56 year old Caucasian female with history of depression, anxiety, chronic pain, presented for medication management.   HPI: [name] is a 56 year old Caucasian female with history of PTSD, MDD, social anxiety disorder, RLS, alcohol use disorder in remission, attention and concentration deficit, chronic back pain, married, lives in [place] was evaluated in office today.  Patient today reports she has been trying to keep herself busy, working on art projects, loves to work with reclaimed woods , recently made a clock, which patient showed a picture of on her phone during the session.  Patient also has been spending time taking care of her father who needs her help.  That does keep her busy.  Reports she goes to bed at around 10 PM and wakes up at around 4 AM.  She is currently getting around 6 hours of sleep.  Does take the Ambien half tablet which helps.  Denies side effects.  Patient reports she is currently compliant on the Lamictal.  Does have a rash on the right side of her upper chest.  However reports likely due to ant bite.  She however agrees to monitor and reach out to primary care provider.  Patient denies any suicidality at this time.  Denies any homicidality or perceptual disturbances.  Patient reports she is happy about the fact that she was assigned someone for her disability application.  She also had a disability evaluation by a provider.  Patient continues to be motivated to stay in therapy.  Denies any other concerns today.      Visit Diagnosis:    ICD-10-CM   1. PTSD (post-traumatic stress disorder)  F43.10 escitalopram (LEXAPRO) 20 MG tablet    hydrOXYzine (VISTARIL) 25 MG capsule    lamoTRIgine (LAMICTAL) 25 MG tablet    2. MDD (major depressive disorder), recurrent episode, moderate (HCC)  F33.1  escitalopram (LEXAPRO) 20 MG tablet    hydrOXYzine (VISTARIL) 25 MG capsule    lamoTRIgine (LAMICTAL) 25 MG tablet    3. Social anxiety disorder  F40.10 hydrOXYzine (VISTARIL) 25 MG capsule    4. RLS (restless legs syndrome)  G25.81     5. Alcohol use disorder, severe, in sustained remission ([place])  F10.21       Past Psychiatric History: Reviewed past psychiatric history from progress note on 04/04/2021.  Past trials of medications-Wellbutrin, Zoloft, Xanax, Cymbalta, Pristiq, BuSpar, Deplin, Valium, Viibryd, Trintellix.  Patient with neuropsychological testing completed-05/05/2021-Dr. Rodenbough-patient does not meet criteria for ADHD.  Past Medical History:  Past Medical History:  Diagnosis Date   Allergy    Anemia    Anxiety    Asthma    Depression    GERD (gastroesophageal reflux disease)    Heart murmur    from childhood   Hiatal hernia    Hypertension    Peptic ulcer    PTSD (post-traumatic stress disorder)    UTI (urinary tract infection)     Past Surgical History:  Procedure Laterality Date   ABDOMINAL HYSTERECTOMY     fibroids   BIOPSY  07/11/2018   Procedure: BIOPSY;  Surgeon: [name], MD;  Location: [facility];  Service: Endoscopy;;   COLONOSCOPY  2014   ESOPHAGOGASTRODUODENOSCOPY (EGD) WITH PROPOFOL N/A 07/11/2018   Procedure: ESOPHAGOGASTRODUODENOSCOPY (EGD) WITH PROPOFOL;  Surgeon: [name], MD;  Location: [facility];  Service: Endoscopy;  Laterality: N/A;  GASTRIC BYPASS  2007   HEMOSTASIS CLIP PLACEMENT  07/11/2018   Procedure: HEMOSTASIS CLIP PLACEMENT;  Surgeon: [name], MD;  Location: [facility];  Service: Endoscopy;;   LAPAROTOMY N/A 03/08/2018   Procedure: EXPLORATORY LAPAROTOMY, REPAIR AND PATCH CLOSURE OF PERFORATED ULCER;  Surgeon: Georganna Skeans, MD;  Location: [facility];  Service: General;  Laterality: N/A;   TUBAL LIGATION      Family Psychiatric History: Reviewed family psychiatric history from progress note on  04/04/2021.  Family History:  Family History  Problem Relation Age of Onset   Stroke Mother    Alzheimer's disease Mother    Hypertension Father    Cancer Father        Prostate   Alcohol abuse Sister    Depression Sister    Alcohol abuse Brother    Depression Brother    Diabetes Maternal Grandfather    Liver disease Maternal Grandfather    Cancer Maternal Grandfather        Prostate   Diabetes Maternal Grandmother    Alzheimer's disease Maternal Grandmother    Diabetes Paternal Grandfather    Cancer Paternal Grandfather        Lung, Prostate   Diabetes Paternal Grandmother    Alzheimer's disease Paternal Grandmother    Colon cancer Neg Hx    Esophageal cancer Neg Hx    Pancreatic cancer Neg Hx    Stomach cancer Neg Hx    Colon polyps Neg Hx    Rectal cancer Neg Hx     Social History: Reviewed social history from progress note on 04/04/2021. Social History   Socioeconomic History   Marital status: Married    Spouse name: mark   Number of children: 0   Years of education: Not on file   Highest education level: High school graduate  Occupational History    Employer: [employer]    Comment: [employer]  Tobacco Use   Smoking status: Former    Types: Cigarettes    Quit date: 01/24/1987    Years since quitting: 34.6   Smokeless tobacco: Never  Vaping Use   Vaping Use: Former   Substances: Flavoring   Devices: has vaped with flavor but no nicotine. Been 8 years  Substance and Sexual Activity   Alcohol use: Not Currently    Alcohol/week: 35.0 standard drinks of alcohol    Types: 35 Cans of beer per week   Drug use: No   Sexual activity: Not Currently  Other Topics Concern   Not on file  Social History Narrative   Regular exercise--no (because of knees)   Social Determinants of Health   Financial Resource Strain: Not on file  Food Insecurity: Not on file  Transportation Needs: Not on file  Physical Activity: Not on file  Stress: Not on file  Social  Connections: Not on file    Allergies:  Allergies  Allergen Reactions   Penicillins Other (See Comments)    Did it involve swelling of the face/tongue/throat, SOB, or low BP? Unk Did it involve sudden or severe rash/hives, skin peeling, or any reaction on the inside of your mouth or nose? Unk Did you need to seek medical attention at a hospital or doctor's office? Unk When did it last happen? Was 56 years old; reaction not recalled     If all above answers are "NO", may proceed with cephalosporin use.    Codeine Itching    Crazy dreams and itching-pt said she can take it with food  Metabolic Disorder Labs: No results found for: "HGBA1C", "MPG" No results found for: "PROLACTIN" Lab Results  Component Value Date   CHOL 139 07/19/2021   TRIG 45.0 07/19/2021   HDL 59.80 07/19/2021   CHOLHDL 2 07/19/2021   VLDL 9.0 07/19/2021   LDLCALC 70 07/19/2021   LDLCALC 112 (H) 09/23/2020   Lab Results  Component Value Date   TSH 3.83 07/19/2021   TSH 2.29 11/04/2020    Therapeutic Level Labs: No results found for: "LITHIUM" No results found for: "VALPROATE" No results found for: "CBMZ"  Current Medications: Current Outpatient Medications  Medication Sig Dispense Refill   Acetaminophen (TYLENOL 8 HOUR ARTHRITIS PAIN PO) Take 2 tablets by mouth as needed.     busPIRone (BUSPAR) 15 MG tablet Take 1 tablet (15 mg total) by mouth 2 (two) times daily. Take daily at 9 AM and at 4 PM 180 tablet 0   Cholecalciferol (VITAMIN D3) 50 MCG (2000 UT) capsule Take 1 capsule (2,000 Units total) by mouth daily. 100 capsule 0   colestipol (COLESTID) 1 g tablet Take 1 tablet (1 g total) by mouth 2 (two) times daily. 60 tablet 2   cyanocobalamin (,VITAMIN B-12,) 1000 MCG/ML injection Inject 1 mL (1,000 mcg total) into the muscle every 30 (thirty) days. 10 mL 0   cyclobenzaprine (FLEXERIL) 10 MG tablet Take 1 tablet (10 mg total) by mouth 3 (three) times daily as needed. for muscle spams 30 tablet 0    Diclofenac Sodium 2 % SOLN Apply 1 pump twice daily as needed. 112 g 1   escitalopram (LEXAPRO) 20 MG tablet Take 1 tablet (20 mg total) by mouth daily. Stop Lexapro 10 mg and 5 mg - dose change 90 tablet 0   estradiol (ESTRACE) 0.1 MG/GM vaginal cream Apply a pea-sized amount of cream to the fingertip and wipe in the front part of the vagina twice weekly 42.5 g 0   estradiol (VIVELLE-DOT) 0.1 MG/24HR patch Place 1 patch (0.1 mg total) onto the skin 2 (two) times a week. 8 patch 5   fluconazole (DIFLUCAN) 150 MG tablet Take 1 tablet by mouth for 1 day. May repeat in 3 days as needed 2 tablet 0   fluticasone (FLONASE) 50 MCG/ACT nasal spray Place 2 sprays into both nostrils daily. 16 g 5   furosemide (LASIX) 40 MG tablet Take 1 tablet (40 mg total) by mouth daily. 90 tablet 1   Homeopathic Products (ALLERGY MEDICINE PO) Take 1 tablet by mouth as needed (Takes the Clortabs. Equate Brand).     HYDROcodone-acetaminophen (NORCO/VICODIN) 5-325 MG tablet Take 1 tablet by mouth every 6 (six) hours as needed. 15 tablet 0   loperamide (IMODIUM) 2 MG capsule Take 2 mg by mouth daily as needed.      omeprazole (PRILOSEC) 40 MG capsule TAKE 1 CAPSULE BY MOUTH TWICE DAILY. OPEN CAPSULE AND SPRINKLE ON APPLESAUCE. 180 capsule 0   Probiotic Product (ALIGN) CHEW Chew 2 tablets by mouth daily. Align pre and probiotic with vitamain C-take one tablet each Align pre and probiotic with Vitamin b12 for immune and energy-take one tablet each     rOPINIRole (REQUIP) 1 MG tablet TAKE 1 TABLET (1 MG TOTAL) BY MOUTH AT BEDTIME. 90 tablet 0   SYRINGE-NEEDLE, DISP, 3 ML (B-D 3CC LUER-LOK SYR 25GX5/8") 25G X 5/8" 3 ML MISC USE AS DIRECTED WITH B-12 INJECTION 16 each 0   VITAMIN A PO Take by mouth.     zolpidem (AMBIEN) 5 MG tablet Take 1  tablet (5 mg total) by mouth at bedtime as needed for sleep. 30 tablet 1   hydrOXYzine (VISTARIL) 25 MG capsule Take 1 capsule (25 mg total) by mouth 2 (two) times daily as needed for anxiety.  And sleep, racing thoughts 60 capsule 1   lamoTRIgine (LAMICTAL) 25 MG tablet Take 1 tablet (25 mg total) by mouth daily. 90 tablet 0   No current facility-administered medications for this visit.     Musculoskeletal: Strength & Muscle Tone: within normal limits Gait & Station: normal Patient leans: N/A  Psychiatric Specialty Exam: Review of Systems  Musculoskeletal:  Positive for back pain.  Skin:  Positive for rash (right upper side chest - likely due to insect bite).  Psychiatric/Behavioral:  Positive for decreased concentration and dysphoric mood. The patient is nervous/anxious.   All other systems reviewed and are negative.   Blood pressure 108/69, pulse 65, temperature 98.5 F (36.9 C), temperature source Temporal, weight 144 lb 12.8 oz (65.7 kg).Body mass index is 26.48 kg/m.  General Appearance: Casual  Eye Contact:  Fair  Speech:  Clear and Coherent  Volume:  Normal  Mood:  Anxious and Depressed  Affect:  Tearful  Thought Process:  Goal Directed and Descriptions of Associations: Intact  Orientation:  Full (Time, Place, and Person)  Thought Content: Logical   Suicidal Thoughts:  No  Homicidal Thoughts:  No  Memory:  Immediate;   Fair Recent;   Fair Remote;   Fair  Judgement:  Fair  Insight:  Fair  Psychomotor Activity:  Normal  Concentration:  Concentration: Fair and Attention Span: Fair  Recall:  [employer] of Knowledge: Fair  Language: Fair  Akathisia:  No  Handed:  Right  AIMS (if indicated): not done  Assets:  Communication Skills Desire for Improvement Housing Talents/Skills Transportation  ADL's:  Intact  Cognition: WNL  Sleep:  Fair   Screenings: [place] Visit from 06/13/2021 in [facility] Office Visit from 05/25/2021 in [facility] Total Score 0 0      GAD-7    [place] Visit from 09/15/2021 in [facility]  from 04/11/2021 in [facility] Visit from 04/04/2021 in [facility]  Total GAD-7 Score '19 15 16      '$ PHQ2-9    [place] Visit from 09/15/2021 in [facility] Office Visit from 08/03/2021 in [place] [facility] Office Visit from 06/13/2021 in [facility] Visit from 05/25/2021 in [facility] from 05/24/2021 in [facility]  PHQ-2 Total Score '1 6 6 6 6  '$ PHQ-9 Total Score '12 22 20 20 22      '$ [facility] Office Visit from 09/15/2021 in [facility] Office Visit from 08/03/2021 in [facility] Counselor from 07/29/2021 in [facility] Low Risk Low Risk No Risk        Assessment and Plan: [name] is a 56 year old Caucasian female with history of PTSD, MDD, social anxiety, alcoholism in remission, chronic pain was evaluated in office today.  Patient is currently struggling with depression, anxiety although with improvement.  Plan as noted below.  Plan PTSD-improving Increase Lexapro to 20 mg p.o. daily BuSpar 15 mg p.o. twice daily-reduced dosage Ambien 2.5-5 mg p.o. nightly as needed Reviewed [place] PMP aware  MDD-some improvement Lexapro increased to 20  mg p.o. daily Lamictal 25 mg p.o. daily.  Will not make any medication readjustment today since patient with rash although she reports it is likely due to ant bite.  Will reevaluate. Patient advised to go to primary care provider for management if the rash does not get better. BuSpar 15 mg p.o. twice daily Hydroxyzine 25 mg p.o. twice daily as needed for severe anxiety.  Social anxiety disorder-unstable Continue CBT with Ms. Christina Hussami Lexapro 20 mg p.o. daily   Restless leg syndrome-improving Requip 1 mg  p.o. nightly  Alcohol use disorder in remission Sober since February 2022.  Discussed lifestyle modification, diet management, exercise.  Patient will continue to need sufficient pain management.  Follow-up in clinic in 6 to 7 weeks or sooner if needed.   This note was generated in part or whole with voice recognition software. Voice recognition is usually quite accurate but there are transcription errors that can and very often do occur. I apologize for any typographical errors that were not detected and corrected.      Ursula Alert, MD 09/15/2021, 6:00 PM

## 2021-09-18 ENCOUNTER — Other Ambulatory Visit: Payer: Self-pay

## 2021-09-19 ENCOUNTER — Other Ambulatory Visit: Payer: Self-pay

## 2021-10-07 ENCOUNTER — Telehealth: Payer: Self-pay | Admitting: Psychiatry

## 2021-10-07 NOTE — Telephone Encounter (Signed)
Patient called stating she had to stop the Lamictal due to skin rash/breakout. Stated you would may prescribe something in it's place. Is there a new medication you can prescribe? Please advise.

## 2021-10-07 NOTE — Telephone Encounter (Signed)
For medication changes she will need a sooner appointment. Please call and schedule sooner or if none available , please let her know we can keep her on wait list and call .

## 2021-10-11 ENCOUNTER — Other Ambulatory Visit: Payer: Self-pay | Admitting: Family Medicine

## 2021-10-11 ENCOUNTER — Other Ambulatory Visit: Payer: Self-pay | Admitting: Psychiatry

## 2021-10-11 ENCOUNTER — Other Ambulatory Visit: Payer: Self-pay

## 2021-10-11 DIAGNOSIS — F331 Major depressive disorder, recurrent, moderate: Secondary | ICD-10-CM

## 2021-10-11 DIAGNOSIS — F4310 Post-traumatic stress disorder, unspecified: Secondary | ICD-10-CM

## 2021-10-11 DIAGNOSIS — G2581 Restless legs syndrome: Secondary | ICD-10-CM

## 2021-10-12 ENCOUNTER — Other Ambulatory Visit: Payer: Self-pay

## 2021-10-12 MED FILL — Ropinirole Hydrochloride Tab 1 MG: ORAL | 90 days supply | Qty: 90 | Fill #0 | Status: AC

## 2021-10-13 ENCOUNTER — Ambulatory Visit (INDEPENDENT_AMBULATORY_CARE_PROVIDER_SITE_OTHER): Payer: Commercial Managed Care - HMO | Admitting: Licensed Clinical Social Worker

## 2021-10-13 ENCOUNTER — Other Ambulatory Visit: Payer: Self-pay

## 2021-10-13 ENCOUNTER — Other Ambulatory Visit: Payer: Self-pay | Admitting: Family Medicine

## 2021-10-13 DIAGNOSIS — F4310 Post-traumatic stress disorder, unspecified: Secondary | ICD-10-CM | POA: Diagnosis not present

## 2021-10-13 MED ORDER — OMEPRAZOLE 40 MG PO CPDR
DELAYED_RELEASE_CAPSULE | ORAL | 0 refills | Status: DC
  Filled 2021-10-13: qty 60, 30d supply, fill #0
  Filled 2021-12-03: qty 60, 30d supply, fill #1
  Filled 2022-02-20: qty 60, 30d supply, fill #2
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
## 2021-10-14 ENCOUNTER — Other Ambulatory Visit: Payer: Self-pay

## 2021-10-17 NOTE — Plan of Care (Signed)
  Problem: Reduce the negative impact trauma related symptoms have on social, occupational, and family functioning per pt self report 3 out of 5 sessions documented. PTSD-Trauma Disorder CCP Problem  1  Goal: LTG: Reduce frequency, intensity, and duration of PTSD symptoms so daily functioning is improved: Input needed on appropriate metric.  per pt self report Outcome: Progressing Goal: STG: Increase participation in previously avoided activities 3 out of 5 sessions documented Outcome: Progressing Intervention: REVIEW PLEASE SKILLS (TREAT PHYSICAL ILLNESS, BALANCE EATING, AVOID MOOD-ALTERING SUBSTANCES, BALANCE SLEEP AND GET EXERCISE) WITH [name] Note: Reviewed; allowed pt to identify skills that are helping most Intervention: Assist with relaxation techniques, as appropriate (deep breathing exercises, meditation, guided imagery) Note: Reviewed

## 2021-10-18 ENCOUNTER — Other Ambulatory Visit: Payer: Self-pay

## 2021-10-19 ENCOUNTER — Telehealth: Payer: Self-pay | Admitting: Psychiatry

## 2021-10-19 ENCOUNTER — Encounter: Payer: Self-pay | Admitting: Psychiatry

## 2021-10-19 ENCOUNTER — Ambulatory Visit
Admission: RE | Admit: 2021-10-19 | Discharge: 2021-10-19 | Disposition: A | Discharge status: Home / Self Care | Payer: Commercial Managed Care - HMO | Source: Ambulatory Visit | Attending: Psychiatry | Admitting: Psychiatry

## 2021-10-19 ENCOUNTER — Other Ambulatory Visit: Payer: Self-pay

## 2021-10-19 ENCOUNTER — Ambulatory Visit (INDEPENDENT_AMBULATORY_CARE_PROVIDER_SITE_OTHER): Payer: Commercial Managed Care - HMO | Admitting: Psychiatry

## 2021-10-19 VITALS — BP 131/78 | HR 76 | Temp 97.8°F | Wt 147.8 lb

## 2021-10-19 DIAGNOSIS — Z9189 Other specified personal risk factors, not elsewhere classified: Secondary | ICD-10-CM | POA: Insufficient documentation

## 2021-10-19 DIAGNOSIS — F4310 Post-traumatic stress disorder, unspecified: Secondary | ICD-10-CM | POA: Insufficient documentation

## 2021-10-19 DIAGNOSIS — F331 Major depressive disorder, recurrent, moderate: Secondary | ICD-10-CM

## 2021-10-19 DIAGNOSIS — G2581 Restless legs syndrome: Secondary | ICD-10-CM

## 2021-10-19 DIAGNOSIS — F4010 Social phobia, unspecified: Secondary | ICD-10-CM | POA: Diagnosis not present

## 2021-10-19 DIAGNOSIS — F1021 Alcohol dependence, in remission: Secondary | ICD-10-CM

## 2021-10-19 MED ORDER — ARIPIPRAZOLE 2 MG PO TABS
2.0000 mg | ORAL_TABLET | Freq: Every morning | ORAL | 1 refills | Status: DC
  Filled 2021-10-19: qty 30, 30d supply, fill #0
  Filled 2021-11-17: qty 30, 30d supply, fill #1

## 2021-10-19 NOTE — Telephone Encounter (Signed)
I have sent Abilify to pharmacy.  EKG reviewed-[id number].  Patient advised to monitor for any side effects including heart palpitations, patient to stop taking the medication if she has any side effects.

## 2021-10-19 NOTE — Patient Instructions (Signed)
Please call for EKG - [phone]  Aripiprazole Tablets What is this medication? ARIPIPRAZOLE (ay ri PIP ray zole) treats schizophrenia, bipolar I disorder, autism spectrum disorder, and Tourette disorder. It may also be used with antidepressant medications to treat depression. It works by balancing the levels of dopamine and serotonin in the brain, hormones that help regulate mood, behaviors, and thoughts. It belongs to a group of medications called antipsychotics. Antipsychotics can be used to treat several kinds of mental health conditions. This medicine may be used for other purposes; ask your health care provider or pharmacist if you have questions. COMMON BRAND NAME(S): Abilify What should I tell my care team before I take this medication? They need to know if you have any of these conditions: Dementia Diabetes Difficulty swallowing Have trouble controlling your muscles Have urges you are unable to control (for example, gambling, spending money, or eating) Heart disease History of irregular heartbeat History of stroke Low blood counts, like low white cell, platelet, or red cell counts Low blood pressure Parkinson disease Seizures Suicidal thoughts, plans or attempt; a previous suicide attempt by you or a family member An unusual or allergic reaction to aripiprazole, other medications, foods, dyes, or preservatives Pregnant or trying to get pregnant Breast-feeding How should I use this medication? Take this medication by mouth with a glass of water. Follow the directions on the prescription label. You can take this medication with or without food. Take your doses at regular intervals. Do not take your medication more often than directed. Do not stop taking except on the advice of your care team. A special MedGuide will be given to you by the pharmacist with each prescription and refill. Be sure to read this information carefully each time. Talk to your care team regarding the use  of this medication in children. While this medication may be prescribed for children as young as 15 years of age for selected conditions, precautions do apply. Overdosage: If you think you have taken too much of this medicine contact a poison control center or emergency room at once. NOTE: This medicine is only for you. Do not share this medicine with others. What if I miss a dose? If you miss a dose, take it as soon as you can. If it is almost time for your next dose, take only that dose. Do not take double or extra doses. What may interact with this medication? Do not take this medication with any of the following: Brexpiprazole Cisapride Dextromethorphan; quinidine Dronedarone Metoclopramide Pimozide Quinidine Thioridazine This medication may also interact with the following: Antihistamines for allergy, cough, and cold Carbamazepine Certain medications for anxiety or sleep Certain medications for depression like amitriptyline, fluoxetine, paroxetine, sertraline Certain medications for fungal infections like fluconazole, itraconazole, ketoconazole, posaconazole, voriconazole Clarithromycin General anesthetics like halothane, isoflurane, methoxyflurane, propofol Levodopa or other medications for Parkinson's disease Medications for blood pressure Medications for seizures Medications that relax muscles for surgery Narcotic medications for pain Other medications that prolong the QT interval (cause an abnormal heart rhythm) Phenothiazines like chlorpromazine, prochlorperazine Rifampin This list may not describe all possible interactions. Give your health care provider a list of all the medicines, herbs, non-prescription drugs, or dietary supplements you use. Also tell them if you smoke, drink alcohol, or use illegal drugs. Some items may interact with your medicine. What should I watch for while using this medication? Visit your care team for regular checks on your progress. Tell your  care team if symptoms do not start to get  better or if they get worse. Do not stop taking except on your care team's advice. You may develop a severe reaction. Your care team will tell you how much medication to take. Patients and their families should watch out for new or worsening depression or thoughts of suicide. Also watch out for sudden changes in feelings such as feeling anxious, agitated, panicky, irritable, hostile, aggressive, impulsive, severely restless, overly excited and hyperactive, or not being able to sleep. If this happens, especially at the beginning of antidepressant treatment or after a change in dose, call your care team. You may get dizzy or drowsy. Do not drive, use machinery, or do anything that needs mental alertness until you know how this medication affects you. Do not stand or sit up quickly, especially if you are an older patient. This reduces the risk of dizzy or fainting spells. Alcohol may interfere with the effect of this medication. Avoid alcoholic drinks. This medication can cause problems with controlling your body temperature. It can lower the response of your body to cold temperatures. If possible, stay indoors during cold weather. If you must go outdoors, wear warm clothes. It can also lower the response of your body to heat. Do not overheat. Do not over-exercise. Stay out of the sun when possible. If you must be in the sun, wear cool clothing. Drink plenty of water. If you have trouble controlling your body temperature, call your care team right away. This medication may cause dry eyes and blurred vision. If you wear contact lenses, you may feel some discomfort. Lubricating drops may help. See your eye care specialist if the problem does not go away or is severe. This medication may increase blood sugar. Ask your care team if changes in diet or medications are needed if you have diabetes. There have been reports of increased sexual urges or other strong urges such as  gambling while taking this medication. If you experience any of these while taking this medication, you should report this to your care team as soon as possible. What side effects may I notice from receiving this medication? Side effects that you should report to your care team as soon as possible: Allergic reactions--skin rash, itching, hives, swelling of the face, lips, tongue, or throat High blood sugar (hyperglycemia)--increased thirst or amount of urine, unusual weakness or fatigue, blurry vision High fever, stiff muscles, increased sweating, fast or irregular heartbeat, and confusion, which may be signs of neuroleptic malignant syndrome Low blood pressure--dizziness, feeling faint or lightheaded, blurry vision Pain or trouble swallowing Prolonged or painful erection Seizures Stroke--sudden numbness or weakness of the face, arm, or leg, trouble speaking, confusion, trouble walking, loss of balance or coordination, dizziness, severe headache, change in vision Uncontrolled and repetitive body movements, muscle stiffness or spasms, tremors or shaking, loss of balance or coordination, restlessness, shuffling walk, which may be signs of extrapyramidal symptoms (EPS) Thoughts of suicide or self-harm, worsening mood, feelings of depression Urges to engage in impulsive behaviors such as gambling, binge eating, sexual activity, or shopping in ways that are unusual for you Side effects that usually do not require medical attention (report these to your care team if they continue or are bothersome): Constipation Drowsiness Weight gain This list may not describe all possible side effects. Call your doctor for medical advice about side effects. You may report side effects to FDA at 1-800-FDA-1088. Where should I keep my medication? Keep out of the reach of children and pets. Store at room temperature between 15 and  30 degrees C (59 and 86 degrees F). Throw away any unused medication after the expiration  date. NOTE: This sheet is a summary. It may not cover all possible information. If you have questions about this medicine, talk to your doctor, pharmacist, or health care provider.  2023 [employer]/Gold Standard (2020-02-12 00:00:00)

## 2021-10-19 NOTE — Progress Notes (Signed)
[facility] MD OP Progress Note  10/19/2021 2:45 PM [name]  MRN:  [id number]  Chief Complaint:  Chief Complaint  Patient presents with   Follow-up   Medication Problem   Medication Refill   HPI: [name] is a 56 year old Caucasian female with history of PTSD, MDD, social anxiety disorder, RLS, alcohol use disorder in remission, attention and concentration deficit, chronic back pain, married, lives in [place] was evaluated in office today.  Patient reports she developed a rash on the Lamictal almost 2 weeks ago.  Patient reports she also had exposure to poison oak which also likely could have triggered a skin reaction since she has a history of it.  Patient reports she hence had to stop taking the Lamictal and since stopping her rash has resolved.  Patient reports since being off of the Lamictal she has noticed she has been more irritable.  Continues to struggle with sadness, anhedonia, low motivation, low energy, restlessness, worrying about things.  Her mood has been going up and down.    Denies any suicidality, homicidality or perceptual disturbances.  Reports sleep is overall okay on the Ambien, takes only 1/2 tablet a few times a week.  Patient however reports she does have sleep restlessness at least 3-4 times a week, pain also causes sleep problems and she is unable to find a comfortable position.  Patient currently motivated to stay in psychotherapy.  Had recent visit with her therapist.  Continues to struggle with cognitive distortions, maladaptive thought pattern.  Patient denies any other concerns today.    Visit Diagnosis:    ICD-10-CM   1. PTSD (post-traumatic stress disorder)  F43.10 EKG 12-Lead    2. MDD (major depressive disorder), recurrent episode, moderate (HCC)  F33.1 EKG 12-Lead    3. Social anxiety disorder  F40.10     4. RLS (restless legs syndrome)  G25.81     5. Alcohol use disorder, severe, in sustained remission ([place])  F10.21     6. At risk  for prolonged QT interval syndrome  Z91.89 EKG 12-Lead      Past Psychiatric History: Past psychiatric history from progress note on 04/04/2021.  Past trials of medications-Wellbutrin, Zoloft, Xanax, Cymbalta, Pristiq, BuSpar, Deplin, Valium, Viibryd, Trintellix, lamictal.  Patient with neuropsychological testing completed-05/05/2021-Dr. Rodenbough-does not meet criteria for ADHD.  Past Medical History:  Past Medical History:  Diagnosis Date   Allergy    Anemia    Anxiety    Asthma    Depression    GERD (gastroesophageal reflux disease)    Heart murmur    from childhood   Hiatal hernia    Hypertension    Peptic ulcer    PTSD (post-traumatic stress disorder)    UTI (urinary tract infection)     Past Surgical History:  Procedure Laterality Date   ABDOMINAL HYSTERECTOMY     fibroids   BIOPSY  07/11/2018   Procedure: BIOPSY;  Surgeon: [name], MD;  Location: [facility];  Service: Endoscopy;;   COLONOSCOPY  2014   ESOPHAGOGASTRODUODENOSCOPY (EGD) WITH PROPOFOL N/A 07/11/2018   Procedure: ESOPHAGOGASTRODUODENOSCOPY (EGD) WITH PROPOFOL;  Surgeon: [name], MD;  Location: [place];  Service: Endoscopy;  Laterality: N/A;   GASTRIC BYPASS  2007   HEMOSTASIS CLIP PLACEMENT  07/11/2018   Procedure: HEMOSTASIS CLIP PLACEMENT;  Surgeon: [name], MD;  Location: [facility];  Service: Endoscopy;;   LAPAROTOMY N/A 03/08/2018   Procedure: EXPLORATORY LAPAROTOMY, REPAIR AND PATCH CLOSURE OF PERFORATED ULCER;  Surgeon: Grandville Silos,  Lavone Neri, MD;  Location: [facility];  Service: General;  Laterality: N/A;   TUBAL LIGATION      Family Psychiatric History: Reviewed family psychiatric history from progress note on 04/04/2021.  Family History:  Family History  Problem Relation Age of Onset   Stroke Mother    Alzheimer's disease Mother    Hypertension Father    Cancer Father        Prostate   Alcohol abuse Sister    Depression Sister    Alcohol abuse Brother    Depression  Brother    Diabetes Maternal Grandfather    Liver disease Maternal Grandfather    Cancer Maternal Grandfather        Prostate   Diabetes Maternal Grandmother    Alzheimer's disease Maternal Grandmother    Diabetes Paternal Grandfather    Cancer Paternal Grandfather        Lung, Prostate   Diabetes Paternal Grandmother    Alzheimer's disease Paternal Grandmother    Colon cancer Neg Hx    Esophageal cancer Neg Hx    Pancreatic cancer Neg Hx    Stomach cancer Neg Hx    Colon polyps Neg Hx    Rectal cancer Neg Hx     Social History: Reviewed social history from progress note on 04/04/2021. Social History   Socioeconomic History   Marital status: Married    Spouse name: mark   Number of children: 0   Years of education: Not on file   Highest education level: High school graduate  Occupational History    Employer: [employer]    Comment: [employer]  Tobacco Use   Smoking status: Former    Types: Cigarettes    Quit date: 01/24/1987    Years since quitting: 34.7   Smokeless tobacco: Never  Vaping Use   Vaping Use: Former   Substances: Flavoring   Devices: has vaped with flavor but no nicotine. Been 8 years  Substance and Sexual Activity   Alcohol use: Not Currently    Alcohol/week: 35.0 standard drinks of alcohol    Types: 35 Cans of beer per week   Drug use: No   Sexual activity: Not Currently  Other Topics Concern   Not on file  Social History Narrative   Regular exercise--no (because of knees)   Social Determinants of Health   Financial Resource Strain: Not on file  Food Insecurity: Not on file  Transportation Needs: Not on file  Physical Activity: Not on file  Stress: Not on file  Social Connections: Not on file    Allergies:  Allergies  Allergen Reactions   Lamictal [Lamotrigine] Rash   Penicillins Other (See Comments)    Did it involve swelling of the face/tongue/throat, SOB, or low BP? Unk Did it involve sudden or severe rash/hives, skin peeling, or  any reaction on the inside of your mouth or nose? Unk Did you need to seek medical attention at a hospital or doctor's office? Unk When did it last happen? Was 56 years old; reaction not recalled     If all above answers are "NO", may proceed with cephalosporin use.    Codeine Itching    Crazy dreams and itching-pt said she can take it with food    Metabolic Disorder Labs: No results found for: "HGBA1C", "MPG" No results found for: "PROLACTIN" Lab Results  Component Value Date   CHOL 139 07/19/2021   TRIG 45.0 07/19/2021   HDL 59.80 07/19/2021   CHOLHDL 2 07/19/2021   VLDL 9.0  07/19/2021   LDLCALC 70 07/19/2021   LDLCALC 112 (H) 09/23/2020   Lab Results  Component Value Date   TSH 3.83 07/19/2021   TSH 2.29 11/04/2020    Therapeutic Level Labs: No results found for: "LITHIUM" No results found for: "VALPROATE" No results found for: "CBMZ"  Current Medications: Current Outpatient Medications  Medication Sig Dispense Refill   Acetaminophen (TYLENOL 8 HOUR ARTHRITIS PAIN PO) Take 2 tablets by mouth as needed.     busPIRone (BUSPAR) 15 MG tablet Take 1 tablet (15 mg total) by mouth 2 (two) times daily. Take daily at 9 AM and at 4 PM 180 tablet 0   Cholecalciferol (VITAMIN D3) 50 MCG (2000 UT) capsule Take 1 capsule (2,000 Units total) by mouth daily. 100 capsule 0   colestipol (COLESTID) 1 g tablet Take 1 tablet (1 g total) by mouth 2 (two) times daily. 60 tablet 2   cyanocobalamin (VITAMIN B12) 1000 MCG/ML injection Inject 1 mL (1,000 mcg total) into the muscle every 30 (thirty) days. 10 mL 0   cyclobenzaprine (FLEXERIL) 10 MG tablet Take 1 tablet (10 mg total) by mouth 3 (three) times daily as needed. for muscle spams 30 tablet 0   Diclofenac Sodium 2 % SOLN Apply 1 pump twice daily as needed. 112 g 1   escitalopram (LEXAPRO) 20 MG tablet Take 1 tablet (20 mg total) by mouth daily. Stop Lexapro 10 mg and 5 mg - dose change 90 tablet 0   estradiol (ESTRACE) 0.1 MG/GM vaginal  cream Apply a pea-sized amount of cream to the fingertip and wipe in the front part of the vagina twice weekly 42.5 g 0   estradiol (VIVELLE-DOT) 0.1 MG/24HR patch Place 1 patch (0.1 mg total) onto the skin 2 (two) times a week. 8 patch 5   fluconazole (DIFLUCAN) 150 MG tablet Take 1 tablet by mouth for 1 day. May repeat in 3 days as needed 2 tablet 0   fluticasone (FLONASE) 50 MCG/ACT nasal spray Place 2 sprays into both nostrils daily. 16 g 5   furosemide (LASIX) 40 MG tablet Take 1 tablet (40 mg total) by mouth daily. 90 tablet 1   Homeopathic Products (ALLERGY MEDICINE PO) Take 1 tablet by mouth as needed (Takes the Clortabs. Equate Brand).     HYDROcodone-acetaminophen (NORCO/VICODIN) 5-325 MG tablet Take 1 tablet by mouth every 6 (six) hours as needed. 15 tablet 0   hydrOXYzine (VISTARIL) 25 MG capsule Take 1 capsule (25 mg total) by mouth 2 (two) times daily as needed for anxiety. And sleep, racing thoughts 60 capsule 1   loperamide (IMODIUM) 2 MG capsule Take 2 mg by mouth daily as needed.      omeprazole (PRILOSEC) 40 MG capsule TAKE 1 CAPSULE BY MOUTH TWICE DAILY. OPEN CAPSULE AND SPRINKLE ON APPLESAUCE. 180 capsule 0   Probiotic Product (ALIGN) CHEW Chew 2 tablets by mouth daily. Align pre and probiotic with vitamain C-take one tablet each Align pre and probiotic with Vitamin b12 for immune and energy-take one tablet each     rOPINIRole (REQUIP) 1 MG tablet TAKE 1 TABLET (1 MG TOTAL) BY MOUTH AT BEDTIME. 90 tablet 1   SYRINGE-NEEDLE, DISP, 3 ML (B-D 3CC LUER-LOK SYR 25GX5/8") 25G X 5/8" 3 ML MISC USE AS DIRECTED WITH B-12 INJECTION 16 each 0   VITAMIN A PO Take by mouth.     zolpidem (AMBIEN) 5 MG tablet Take 1 tablet (5 mg total) by mouth at bedtime as needed for sleep. 30 tablet  1   No current facility-administered medications for this visit.     Musculoskeletal: Strength & Muscle Tone: within normal limits Gait & Station: normal Patient leans: N/A  Psychiatric Specialty  Exam: Review of Systems  Musculoskeletal:        Back pain  Psychiatric/Behavioral:  Positive for decreased concentration, dysphoric mood and sleep disturbance. The patient is nervous/anxious.   All other systems reviewed and are negative.   Blood pressure 131/78, pulse 76, temperature 97.8 F (36.6 C), temperature source Temporal, weight 147 lb 12.8 oz (67 kg).Body mass index is 27.03 kg/m.  General Appearance: Casual  Eye Contact:  Good  Speech:  Normal Rate  Volume:  Normal  Mood:  Anxious and Depressed  Affect:  Tearful  Thought Process:  Goal Directed and Descriptions of Associations: Intact  Orientation:  Full (Time, Place, and Person)  Thought Content: Rumination   Suicidal Thoughts:  No  Homicidal Thoughts:  No  Memory:  Immediate;   Fair Recent;   Fair Remote;   Fair  Judgement:  Fair  Insight:  Fair  Psychomotor Activity:  Normal  Concentration:  Concentration: Fair and Attention Span: Fair  Recall:  [employer] of Knowledge: Fair  Language: Fair  Akathisia:  No  Handed:  Right  AIMS (if indicated): not done  Assets:  Communication Skills Desire for [employer] Talents/Skills Transportation  ADL's:  Intact  Cognition: WNL  Sleep:   restless likely due to pain   Screenings: [facility] Visit from 06/13/2021 in [facility] Visit from 05/25/2021 in [facility] Total Score 0 0      GAD-7    [facility] Visit from 10/19/2021 in [facility] Visit from 09/15/2021 in [facility] from 04/11/2021 in [facility] Visit from 04/04/2021 in [facility]  Total GAD-7 Score '19 19 15 16      '$ PHQ2-9    [facility] Visit from 10/19/2021 in [facility] Visit from 09/15/2021 in  [facility] Visit from 08/03/2021 in [facility] Visit from 06/13/2021 in [facility] Visit from 05/25/2021 in [facility]  PHQ-2 Total Score '6 1 6 6 6  '$ PHQ-9 Total Score '23 12 22 20 20      '$ [facility] Visit from 10/19/2021 in [facility] Counselor from 10/13/2021 in [facility] Office Visit from 09/15/2021 in [facility] Low Risk No Risk Low Risk        Assessment and Plan: [name] is a 56 year old Caucasian female with history of PTSD, MDD, social anxiety, alcohol use disorder in remission, chronic pain was evaluated in office today.  Patient currently struggling with depression, anxiety, recent adverse reaction to Lamictal, mood symptoms exacerbated by chronic pain will, benefit from following plan.  Plan PTSD-unstable Lexapro 20 mg p.o. daily BuSpar 15 mg p.o. twice daily-reduced dosage Ambien 2.5-5 mg p.o. nightly as needed Reviewed [place] PMP  aware Patient advised to have more frequent psychotherapy sessions.  MDD-unstable Will consider starting Abilify 2 mg p.o. daily.  However will get an EKG first. Continue Lexapro 20 mg p.o. daily Discontinue lamotrigine due to side effects of rash-patient already stopped taking it. Hydroxyzine 25 mg p.o. twice daily as needed for severe anxiety.  Social anxiety disorder-unstable Continue CBT with Ms. Christina Hussami. Lexapro 20 mg p.o. daily  Restless leg syndrome-improving Requip 1 mg p.o. nightly  Alcohol use disorder in remission Sober since February 2022.  At risk for prolonged QT syndrome-we will order EKG.  Provided #[id number].  Will send Abilify to pharmacy as needed once reviewing EKG.  Follow-up in clinic in 4 weeks or sooner if needed.  This note was generated in  part or whole with voice recognition software. Voice recognition is usually quite accurate but there are transcription errors that can and very often do occur. I apologize for any typographical errors that were not detected and corrected.     Ursula Alert, MD 10/19/2021, 2:45 PM

## 2021-10-21 ENCOUNTER — Ambulatory Visit: Payer: Commercial Managed Care - HMO | Admitting: Family Medicine

## 2021-10-24 NOTE — Progress Notes (Signed)
I, [name], LAT, ATC acting as a scribe for [name], MD.  Subjective:    CC: bilat knee and bilat hip pain  HPI: Pt is a 56 y/o female c/o cont'd knee and hip pain, R>L. Pt was previously seen by [name] in 2021 for L lateral chest and anterior shoulder pain. Today, pt c/o bilat hip and knee pain has been going on for awhile, but has been masked by her chronic LBP. Pt notes that both legs have been swelling, R>L, and has an upcoming visit w/ cardiology scheduled. Pt locates pain to the anterior and posterior aspect of the R knee. Pt has numbness into there R Great toe. 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   Knee swelling: yes Mechanical symptoms: Aggravates: activity, walking, transitioning to stand Treatments tried: Tylenol  Pertinent review of Systems: No fevers or chills.  Positive for bilateral lower extremity edema.  Relevant historical information: Hypertension.  History of gastric bypass.   Objective:    Vitals:   10/25/21 0810  BP: 112/78  Pulse: 64  SpO2: 98%   General: Well Developed, well nourished, and in no acute distress.   MSK: Right hip normal-appearing normal motion.  Palpable and audible clunk with hip flexion. Pain with resisted hip flexion is present. Hip abduction and external rotation strength mildly diminished. Leg lengths are equal.  Right knee mild effusion normal-appearing otherwise normal motion with crepitation.  Stable ligamentous exam intact strength.  Bilateral lower extremity edema is present.  Calf diameters equal appearing.  Lab and Radiology Results   Procedure: Real-time Ultrasound Guided Injection of right knee superior lateral patellar space Device: Philips Affiniti 50G Images permanently stored and available for review in PACS Verbal informed consent obtained.  Discussed risks and benefits of procedure. Warned about infection, bleeding, hyperglycemia damage to structures among  others. Patient expresses understanding and agreement Time-out conducted.   Noted no overlying erythema, induration, or other signs of local infection.   Skin prepped in a sterile fashion.   Local anesthesia: Topical Ethyl chloride.   With sterile technique and under real time ultrasound guidance: 40 mg of Kenalog and 2 mg Marcaine injected into the knee joint. Fluid seen entering the joint capsule.   Completed without difficulty   Pain immediately resolved suggesting accurate placement of the medication.   Advised to call if fevers/chills, erythema, induration, drainage, or persistent bleeding.   Images permanently stored and available for review in the ultrasound unit.  Impression: Technically successful ultrasound guided injection.    X-ray images right hip and right knee obtained today personally and independently interpreted  Right hip: Mild spur superior acetabulum.  No severe hip DJD.  No acute fractures are present.  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  No acute fractures are present.  Await formal radiology review   Impression and Recommendations:    Assessment and Plan: 56 y.o. female with right anterior hip pain.  Etiology is unclear.  She has mechanical clunk present which could be a labrum tear or could be internal snapping hip syndrome.  Check back in 1 month.  If not improving would consider MRI arthrogram.  Exercise plan for now.  Right knee pain thought to be exacerbation of DJD.  Plan for steroid injection today.  Check back in 1 month.Marland Kitchen  PDMP not reviewed this encounter. Orders Placed This Encounter  Procedures   [place] LIMITED JOINT SPACE STRUCTURES LOW RIGHT(NO  LINKED CHARGES)    Order Specific Question:   Reason for Exam (SYMPTOM  OR DIAGNOSIS REQUIRED)    Answer:   right knee pain    Order Specific Question:   Preferred imaging location?    Answer:   [facility]   DG Knee AP/LAT W/[place] Right     Standing Status:   Future    Number of Occurrences:   1    Standing Expiration Date:   11/25/2021    Order Specific Question:   Reason for Exam (SYMPTOM  OR DIAGNOSIS REQUIRED)    Answer:   right knee pain    Order Specific Question:   Preferred imaging location?    Answer:   [name]    Order Specific Question:   Is patient pregnant?    Answer:   No   DG HIP UNILAT W OR W/O PELVIS 2-3 VIEWS RIGHT    Standing Status:   Future    Number of Occurrences:   1    Standing Expiration Date:   11/25/2021    Order Specific Question:   Reason for Exam (SYMPTOM  OR DIAGNOSIS REQUIRED)    Answer:   right hip pain    Order Specific Question:   Preferred imaging location?    Answer:   [name]    Order Specific Question:   Is patient pregnant?    Answer:   No   No orders of the defined types were placed in this encounter.   Discussed warning signs or symptoms. Please see discharge instructions. Patient expresses understanding.   The above documentation has been reviewed and is accurate and complete [name], M.D.

## 2021-10-25 ENCOUNTER — Encounter: Payer: Self-pay | Admitting: Family Medicine

## 2021-10-25 ENCOUNTER — Ambulatory Visit: Payer: Self-pay

## 2021-10-25 ENCOUNTER — Ambulatory Visit: Payer: Commercial Managed Care - HMO | Admitting: Family Medicine

## 2021-10-25 ENCOUNTER — Ambulatory Visit (INDEPENDENT_AMBULATORY_CARE_PROVIDER_SITE_OTHER): Payer: Commercial Managed Care - HMO | Admitting: Family Medicine

## 2021-10-25 ENCOUNTER — Ambulatory Visit (INDEPENDENT_AMBULATORY_CARE_PROVIDER_SITE_OTHER): Payer: Commercial Managed Care - HMO

## 2021-10-25 VITALS — BP 132/88 | HR 71 | Temp 99.0°F | Ht 62.0 in | Wt 148.4 lb

## 2021-10-25 VITALS — BP 112/78 | HR 64 | Ht 62.0 in | Wt 146.0 lb

## 2021-10-25 DIAGNOSIS — R413 Other amnesia: Secondary | ICD-10-CM

## 2021-10-25 DIAGNOSIS — G8929 Other chronic pain: Secondary | ICD-10-CM

## 2021-10-25 DIAGNOSIS — M25561 Pain in right knee: Secondary | ICD-10-CM

## 2021-10-25 DIAGNOSIS — M25551 Pain in right hip: Secondary | ICD-10-CM

## 2021-10-25 DIAGNOSIS — J0110 Acute frontal sinusitis, unspecified: Secondary | ICD-10-CM

## 2021-10-25 DIAGNOSIS — Z23 Encounter for immunization: Secondary | ICD-10-CM

## 2021-10-25 MED ORDER — FLUTICASONE PROPIONATE 50 MCG/ACT NA SUSP
2.0000 | Freq: Every day | NASAL | 5 refills | Status: DC
  Filled 2021-10-25: qty 16, 30d supply, fill #0
  Filled 2021-12-03: qty 16, 30d supply, fill #1
  Filled 2022-03-27: qty 48, 90d supply, fill #1
  Filled 2022-07-16: qty 32, 60d supply, fill #2

## 2021-10-25 NOTE — Progress Notes (Signed)
Subjective:   By signing my name below, I, [name], attest that this documentation has been prepared under the direction and in the presence of Mary Held, DO  10/25/2021      Patient ID: [name], female    DOB: [birth year], 56 y.o.   MRN: [id number]  Chief Complaint  Patient presents with   Medical Management of Chronic Issues    F/u  Memory concerns     HPI Patient is in today for an office visit  She is requesting for a refill on her Flonase. She is interested in taking the influenza vaccine this visit. She is also interested in receiving the newest Covid 19 vaccine at a different location. She is experiencing environmental allergy symptoms.  Patient is complaining of memory issues that may be due to stressors in her life. She reports of gaining weight and suggests it may be from the 20 mg Lexapro medication she is currently taking.  Wt Readings from Last 3 Encounters:  10/25/21 148 lb 6.4 oz (67.3 kg)  10/25/21 146 lb (66.2 kg)  07/19/21 136 lb 6.4 oz (61.9 kg)     Past Medical History:  Diagnosis Date   Allergy    Anemia    Anxiety    Asthma    Depression    GERD (gastroesophageal reflux disease)    Heart murmur    from childhood   Hiatal hernia    Hypertension    Peptic ulcer    PTSD (post-traumatic stress disorder)    UTI (urinary tract infection)     Past Surgical History:  Procedure Laterality Date   ABDOMINAL HYSTERECTOMY     fibroids   BIOPSY  07/11/2018   Procedure: BIOPSY;  Surgeon: [name], MD;  Location: [facility] ENDOSCOPY;  Service: Endoscopy;;   COLONOSCOPY  2014   ESOPHAGOGASTRODUODENOSCOPY (EGD) WITH PROPOFOL N/A 07/11/2018   Procedure: ESOPHAGOGASTRODUODENOSCOPY (EGD) WITH PROPOFOL;  Surgeon: [name], MD;  Location: [facility] ENDOSCOPY;  Service: Endoscopy;  Laterality: N/A;   GASTRIC BYPASS  2007   HEMOSTASIS CLIP PLACEMENT  07/11/2018   Procedure: HEMOSTASIS CLIP PLACEMENT;  Surgeon: [name], MD;   Location: [place];  Service: Endoscopy;;   LAPAROTOMY N/A 03/08/2018   Procedure: EXPLORATORY LAPAROTOMY, REPAIR AND PATCH CLOSURE OF PERFORATED ULCER;  Surgeon: Georganna Skeans, MD;  Location: [place];  Service: General;  Laterality: N/A;   TUBAL LIGATION      Family History  Problem Relation Age of Onset   Stroke Mother    Alzheimer's disease Mother    Hypertension Father    Cancer Father        Prostate   Alcohol abuse Sister    Depression Sister    Alcohol abuse Brother    Depression Brother    Diabetes Maternal Grandfather    Liver disease Maternal Grandfather    Cancer Maternal Grandfather        Prostate   Diabetes Maternal Grandmother    Alzheimer's disease Maternal Grandmother    Diabetes Paternal Grandfather    Cancer Paternal Grandfather        Lung, Prostate   Diabetes Paternal Grandmother    Alzheimer's disease Paternal Grandmother    Colon cancer Neg Hx    Esophageal cancer Neg Hx    Pancreatic cancer Neg Hx    Stomach cancer Neg Hx    Colon polyps Neg Hx    Rectal cancer Neg Hx     Social History   Socioeconomic  History   Marital status: Married    Spouse name: mark   Number of children: 0   Years of education: Not on file   Highest education level: High school graduate  Occupational History    Employer: [employer]    Comment: [employer]  Tobacco Use   Smoking status: Former    Types: Cigarettes    Quit date: 01/24/1987    Years since quitting: 34.7   Smokeless tobacco: Never  Vaping Use   Vaping Use: Former   Substances: Flavoring   Devices: has vaped with flavor but no nicotine. Been 8 years  Substance and Sexual Activity   Alcohol use: Not Currently    Alcohol/week: 35.0 standard drinks of alcohol    Types: 35 Cans of beer per week   Drug use: No   Sexual activity: Not Currently  Other Topics Concern   Not on file  Social History Narrative   Regular exercise--no (because of knees)   Social Determinants of Health   Financial  Resource Strain: Not on file  Food Insecurity: Not on file  Transportation Needs: Not on file  Physical Activity: Not on file  Stress: Not on file  Social Connections: Not on file  Intimate Partner Violence: Not on file    Outpatient Medications Prior to Visit  Medication Sig Dispense Refill   Acetaminophen (TYLENOL 8 HOUR ARTHRITIS PAIN PO) Take 2 tablets by mouth as needed.     ARIPiprazole (ABILIFY) 2 MG tablet Take 1 tablet (2 mg total) by mouth in the morning. 30 tablet 1   busPIRone (BUSPAR) 15 MG tablet Take 1 tablet (15 mg total) by mouth 2 (two) times daily. Take daily at 9 AM and at 4 PM 180 tablet 0   Cholecalciferol (VITAMIN D3) 50 MCG (2000 UT) capsule Take 1 capsule (2,000 Units total) by mouth daily. 100 capsule 0   colestipol (COLESTID) 1 g tablet Take 1 tablet (1 g total) by mouth 2 (two) times daily. 60 tablet 2   cyanocobalamin (VITAMIN B12) 1000 MCG/ML injection Inject 1 mL (1,000 mcg total) into the muscle every 30 (thirty) days. 10 mL 0   cyclobenzaprine (FLEXERIL) 10 MG tablet Take 1 tablet (10 mg total) by mouth 3 (three) times daily as needed. for muscle spams 30 tablet 0   Diclofenac Sodium 2 % SOLN Apply 1 pump twice daily as needed. 112 g 1   escitalopram (LEXAPRO) 20 MG tablet Take 1 tablet (20 mg total) by mouth daily. Stop Lexapro 10 mg and 5 mg - dose change 90 tablet 0   estradiol (ESTRACE) 0.1 MG/GM vaginal cream Apply a pea-sized amount of cream to the fingertip and wipe in the front part of the vagina twice weekly 42.5 g 0   estradiol (VIVELLE-DOT) 0.1 MG/24HR patch Place 1 patch (0.1 mg total) onto the skin 2 (two) times a week. 8 patch 5   furosemide (LASIX) 40 MG tablet Take 1 tablet (40 mg total) by mouth daily. 90 tablet 1   Homeopathic Products (ALLERGY MEDICINE PO) Take 1 tablet by mouth as needed (Takes the Clortabs. Equate Brand).     HYDROcodone-acetaminophen (NORCO/VICODIN) 5-325 MG tablet Take 1 tablet by mouth every 6 (six) hours as needed. 15  tablet 0   hydrOXYzine (VISTARIL) 25 MG capsule Take 1 capsule (25 mg total) by mouth 2 (two) times daily as needed for anxiety. And sleep, racing thoughts 60 capsule 1   loperamide (IMODIUM) 2 MG capsule Take 2 mg by mouth daily  as needed.      omeprazole (PRILOSEC) 40 MG capsule TAKE 1 CAPSULE BY MOUTH TWICE DAILY. OPEN CAPSULE AND SPRINKLE ON APPLESAUCE. 180 capsule 0   Probiotic Product (ALIGN) CHEW Chew 2 tablets by mouth daily. Align pre and probiotic with vitamain C-take one tablet each Align pre and probiotic with Vitamin b12 for immune and energy-take one tablet each     rOPINIRole (REQUIP) 1 MG tablet TAKE 1 TABLET (1 MG TOTAL) BY MOUTH AT BEDTIME. 90 tablet 1   SYRINGE-NEEDLE, DISP, 3 ML (B-D 3CC LUER-LOK SYR 25GX5/8") 25G X 5/8" 3 ML MISC USE AS DIRECTED WITH B-12 INJECTION 16 each 0   VITAMIN A PO Take by mouth.     zolpidem (AMBIEN) 5 MG tablet Take 1 tablet (5 mg total) by mouth at bedtime as needed for sleep. 30 tablet 1   fluticasone (FLONASE) 50 MCG/ACT nasal spray Place 2 sprays into both nostrils daily. 16 g 5   fluconazole (DIFLUCAN) 150 MG tablet Take 1 tablet by mouth for 1 day. May repeat in 3 days as needed (Patient not taking: Reported on 10/25/2021) 2 tablet 0   No facility-administered medications prior to visit.    Allergies  Allergen Reactions   Lamictal [Lamotrigine] Rash   Penicillins Other (See Comments)    Did it involve swelling of the face/tongue/throat, SOB, or low BP? Unk Did it involve sudden or severe rash/hives, skin peeling, or any reaction on the inside of your mouth or nose? Unk Did you need to seek medical attention at a hospital or doctor's office? Unk When did it last happen? Was 57 years old; reaction not recalled     If all above answers are "NO", may proceed with cephalosporin use.    Codeine Itching    Crazy dreams and itching-pt said she can take it with food    Review of Systems  Constitutional:  Negative for fever and malaise/fatigue.   HENT:  Negative for congestion.   Eyes:  Negative for blurred vision.  Respiratory:  Negative for shortness of breath.   Cardiovascular:  Negative for chest pain, palpitations and leg swelling.  Gastrointestinal:  Negative for abdominal pain, blood in stool and nausea.  Genitourinary:  Negative for dysuria and frequency.  Musculoskeletal:  Negative for falls.  Skin:  Negative for rash.  Neurological:  Negative for dizziness, loss of consciousness and headaches.  Endo/Heme/Allergies:  Positive for environmental allergies.  Psychiatric/Behavioral:  Positive for memory loss. Negative for depression. The patient is not nervous/anxious.        Objective:    Physical Exam Vitals and nursing note reviewed.  Constitutional:      General: She is not in acute distress.    Appearance: Normal appearance. She is well-developed.  HENT:     Head: Normocephalic and atraumatic.     Right Ear: External ear normal.     Left Ear: External ear normal.  Eyes:     Extraocular Movements: Extraocular movements intact.     Conjunctiva/sclera: Conjunctivae normal.     Pupils: Pupils are equal, round, and reactive to light.  Neck:     Thyroid: No thyromegaly.     Vascular: No carotid bruit or JVD.  Cardiovascular:     Rate and Rhythm: Normal rate and regular rhythm.     Heart sounds: Normal heart sounds. No murmur heard.    No gallop.  Pulmonary:     Effort: Pulmonary effort is normal. No respiratory distress.     Breath  sounds: Normal breath sounds. No wheezing or rales.  Chest:     Chest wall: No tenderness.  Musculoskeletal:     Cervical back: Normal range of motion and neck supple.  Skin:    General: Skin is warm and dry.  Neurological:     Mental Status: She is alert and oriented to person, place, and time.  Psychiatric:        Judgment: Judgment normal.     BP 132/88   Pulse 71   Temp 99 F (37.2 C) (Oral)   Ht '5\' 2"'$  (1.575 m)   Wt 148 lb 6.4 oz (67.3 kg)   SpO2 99%   BMI 27.14  kg/m  Wt Readings from Last 3 Encounters:  10/25/21 148 lb 6.4 oz (67.3 kg)  10/25/21 146 lb (66.2 kg)  07/19/21 136 lb 6.4 oz (61.9 kg)    Diabetic Foot Exam - Simple   No data filed    Lab Results  Component Value Date   WBC 3.5 (L) 07/19/2021   HGB 12.0 07/19/2021   HCT 36.1 07/19/2021   PLT 177.0 07/19/2021   GLUCOSE 83 07/19/2021   CHOL 139 07/19/2021   TRIG 45.0 07/19/2021   HDL 59.80 07/19/2021   LDLCALC 70 07/19/2021   ALT 38 (H) 07/19/2021   AST 32 07/19/2021   NA 142 07/19/2021   K 4.7 07/19/2021   CL 109 07/19/2021   CREATININE 0.75 07/19/2021   BUN 13 07/19/2021   CO2 30 07/19/2021   TSH 3.83 07/19/2021    Lab Results  Component Value Date   TSH 3.83 07/19/2021   Lab Results  Component Value Date   WBC 3.5 (L) 07/19/2021   HGB 12.0 07/19/2021   HCT 36.1 07/19/2021   MCV 90.7 07/19/2021   PLT 177.0 07/19/2021   Lab Results  Component Value Date   NA 142 07/19/2021   K 4.7 07/19/2021   CO2 30 07/19/2021   GLUCOSE 83 07/19/2021   BUN 13 07/19/2021   CREATININE 0.75 07/19/2021   BILITOT 0.5 07/19/2021   ALKPHOS 78 07/19/2021   AST 32 07/19/2021   ALT 38 (H) 07/19/2021   PROT 5.9 (L) 07/19/2021   ALBUMIN 3.8 07/19/2021   CALCIUM 9.0 07/19/2021   ANIONGAP 6 07/12/2018   GFR 89.09 07/19/2021   Lab Results  Component Value Date   CHOL 139 07/19/2021   Lab Results  Component Value Date   HDL 59.80 07/19/2021   Lab Results  Component Value Date   LDLCALC 70 07/19/2021   Lab Results  Component Value Date   TRIG 45.0 07/19/2021   Lab Results  Component Value Date   CHOLHDL 2 07/19/2021   No results found for: "HGBA1C"     Assessment & Plan:  1. Memory loss Check labs  Refer to neuropsych \ ? Secondary to inc stress  - Ambulatory referral to Neuropsychology - CBC with Differential/Platelet; Future - Comprehensive metabolic panel; Future - TSH; Future - Vitamin B12; Future - VITAMIN D 25 Hydroxy (Vit-D Deficiency,  Fractures); Future  2. Acute frontal sinusitis, recurrence not specified No symptoms now  - fluticasone (FLONASE) 50 MCG/ACT nasal spray; Place 2 sprays into both nostrils daily.  Dispense: 16 g; Refill: 5  3. Need for influenza vaccination   - Flu Vaccine QUAD 49moIM (Fluarix, Fluzone & Alfiuria Quad PF)    Meds ordered this encounter  Medications   fluticasone (FLONASE) 50 MCG/ACT nasal spray    Sig: Place 2 sprays into both nostrils  daily.    Dispense:  16 g    Refill:  5    I, Mary Held, DO, personally preformed the services described in this documentation.  All medical record entries made by the scribe were at my direction and in my presence.  I have reviewed the chart and discharge instructions (if applicable) and agree that the record reflects my personal performance and is accurate and complete. 10/25/2021   I,[name],acting as a scribe for Mary Held, DO.,have documented all relevant documentation on the behalf of Mary Held, DO,as directed by  Mary Held, DO while in the presence of Mary Held, DO.   Mary Held, DO

## 2021-10-25 NOTE — Patient Instructions (Signed)

## 2021-10-25 NOTE — Patient Instructions (Addendum)
Thank you for coming in today.   Please get an Xray today before you leave   You received an injection today. Seek immediate medical attention if the joint becomes red, extremely painful, or is oozing fluid.   Please use Voltaren gel (Generic Diclofenac Gel) up to 4x daily for pain as needed.  This is available over-the-counter as both the name brand Voltaren gel and the generic diclofenac gel.   Check back in 1 month

## 2021-10-26 ENCOUNTER — Other Ambulatory Visit: Payer: Self-pay | Admitting: Family Medicine

## 2021-10-26 ENCOUNTER — Other Ambulatory Visit: Payer: Self-pay | Admitting: Urology

## 2021-10-26 ENCOUNTER — Telehealth: Payer: Self-pay

## 2021-10-26 ENCOUNTER — Other Ambulatory Visit: Payer: Self-pay

## 2021-10-26 DIAGNOSIS — R413 Other amnesia: Secondary | ICD-10-CM

## 2021-10-26 NOTE — Telephone Encounter (Signed)
Incoming fax in regards to refill on Estrace cream not being on pt's formulary. Attempted to reach pt to discuss using GoodRx coupon, [facility] asking pt to return call.

## 2021-10-26 NOTE — Addendum Note (Signed)
Addended by: Roma Schanz R on: 10/26/2021 12:15 PM   Modules accepted: Orders

## 2021-10-27 NOTE — Progress Notes (Signed)
Right hip x-ray looks pretty normal to radiology.

## 2021-10-27 NOTE — Progress Notes (Signed)
Right knee x-ray shows a little bit of swelling in the knee joint but otherwise looks okay to radiology.

## 2021-10-28 ENCOUNTER — Ambulatory Visit: Payer: 59 | Admitting: Psychiatry

## 2021-11-01 NOTE — Progress Notes (Unsigned)
Cardiology Office Note  Date:  11/02/2021   ID:  Mary, [name] Dec 30, 1965, MRN [id number]  PCP:  Mary Held, DO   Chief Complaint  Patient presents with   Chest Pain    Patient states that she has been having some chest pain. Patient's legs swell and has some tingling. She also states her first and second toe will lock together and her fingers and great toe turn white at times. Meds reviewed with patient.     HPI:  [name] is a 56 year old woman with past medical history of PTSD/memory loss/ADD/anxiety Former smoker (high school) ETOH, quit 2 years ago DJD Hx of falls, mechanical Who presents by referral from [facility] for chest pain  First episode of chest pain she reports was in Aug 2022, She described it as a quick spasm in the left chest, went away quickly, no interevntion needed for symptoms, no recurrence  Denies chest pain on exertion More recently reports having sporadic sharp pains in her chest that lasts a couple of seconds for the past couple of weeks presenting at rest  can cause her to lose her breath for a second Pain under left breast, radiating around her flank to her back She reports having lots of arthritides  Labs reviewed: Total chol 139, LDL 70  Cardiac risk factors as below Non-smoker, no diabetes, well-controlled cholesterol  EKG personally reviewed by myself on todays visit NSR rate 59 bpm no ST or T wave changes  PMH:   has a past medical history of Allergy, Anemia, Anxiety, Asthma, Depression, GERD (gastroesophageal reflux disease), Heart murmur, Hiatal hernia, Hypertension, Peptic ulcer, PTSD (post-traumatic stress disorder), and UTI (urinary tract infection).  PSH:    Past Surgical History:  Procedure Laterality Date   ABDOMINAL HYSTERECTOMY     fibroids   BIOPSY  07/11/2018   Procedure: BIOPSY;  Surgeon: [name], MD;  Location: [facility] ENDOSCOPY;  Service: Endoscopy;;   COLONOSCOPY  2014    ESOPHAGOGASTRODUODENOSCOPY (EGD) WITH PROPOFOL N/A 07/11/2018   Procedure: ESOPHAGOGASTRODUODENOSCOPY (EGD) WITH PROPOFOL;  Surgeon: [name], MD;  [facility] ENDOSCOPY;  Service: Endoscopy;  Laterality: N/A;   GASTRIC BYPASS  2007   HEMOSTASIS CLIP PLACEMENT  07/11/2018   Procedure: HEMOSTASIS CLIP PLACEMENT;  Surgeon: [name], MD;  Location: [place];  Service: Endoscopy;;   LAPAROTOMY N/A 03/08/2018   Procedure: EXPLORATORY LAPAROTOMY, REPAIR AND PATCH CLOSURE OF PERFORATED ULCER;  Surgeon: Georganna Skeans, MD;  Location: [place];  Service: General;  Laterality: N/A;   TUBAL LIGATION      Current Outpatient Medications  Medication Sig Dispense Refill   Acetaminophen (TYLENOL 8 HOUR ARTHRITIS PAIN PO) Take 2 tablets by mouth as needed.     ARIPiprazole (ABILIFY) 2 MG tablet Take 1 tablet (2 mg total) by mouth in the morning. 30 tablet 1   busPIRone (BUSPAR) 15 MG tablet Take 1 tablet (15 mg total) by mouth 2 (two) times daily. Take daily at 9 AM and at 4 PM 180 tablet 0   Cholecalciferol (VITAMIN D3) 50 MCG (2000 UT) capsule Take 1 capsule (2,000 Units total) by mouth daily. 100 capsule 0   colestipol (COLESTID) 1 g tablet Take 1 tablet (1 g total) by mouth 2 (two) times daily. 60 tablet 2   cyanocobalamin (VITAMIN B12) 1000 MCG/ML injection Inject 1 mL (1,000 mcg total) into the muscle every 30 (thirty) days. 10 mL 0   cyclobenzaprine (FLEXERIL) 10 MG tablet Take 1  tablet (10 mg total) by mouth 3 (three) times daily as needed. for muscle spams 30 tablet 0   Diclofenac Sodium 2 % SOLN Apply 1 pump twice daily as needed. 112 g 1   escitalopram (LEXAPRO) 20 MG tablet Take 1 tablet (20 mg total) by mouth daily. Stop Lexapro 10 mg and 5 mg - dose change 90 tablet 0   estradiol (ESTRACE) 0.1 MG/GM vaginal cream Apply a pea-sized amount of cream to the fingertip and wipe in the front part of the vagina twice weekly 42.5 g 0   estradiol (VIVELLE-DOT) 0.1 MG/24HR patch Place 1 patch  (0.1 mg total) onto the skin 2 (two) times a week. 8 patch 5   fluticasone (FLONASE) 50 MCG/ACT nasal spray Place 2 sprays into both nostrils daily. 16 g 5   furosemide (LASIX) 40 MG tablet Take 1 tablet (40 mg total) by mouth daily. 90 tablet 1   Homeopathic Products (ALLERGY MEDICINE PO) Take 1 tablet by mouth as needed (Takes the Clortabs. Equate Brand).     HYDROcodone-acetaminophen (NORCO/VICODIN) 5-325 MG tablet Take 1 tablet by mouth every 6 (six) hours as needed. 15 tablet 0   hydrOXYzine (VISTARIL) 25 MG capsule Take 1 capsule (25 mg total) by mouth 2 (two) times daily as needed for anxiety. And sleep, racing thoughts 60 capsule 1   loperamide (IMODIUM) 2 MG capsule Take 2 mg by mouth daily as needed.      omeprazole (PRILOSEC) 40 MG capsule TAKE 1 CAPSULE BY MOUTH TWICE DAILY. OPEN CAPSULE AND SPRINKLE ON APPLESAUCE. 180 capsule 0   Probiotic Product (ALIGN) CHEW Chew 2 tablets by mouth daily. Align pre and probiotic with vitamain C-take one tablet each Align pre and probiotic with Vitamin b12 for immune and energy-take one tablet each     rOPINIRole (REQUIP) 1 MG tablet TAKE 1 TABLET (1 MG TOTAL) BY MOUTH AT BEDTIME. 90 tablet 1   SYRINGE-NEEDLE, DISP, 3 ML (B-D 3CC LUER-LOK SYR 25GX5/8") 25G X 5/8" 3 ML MISC USE AS DIRECTED WITH B-12 INJECTION 16 each 0   VITAMIN A PO Take by mouth.     zolpidem (AMBIEN) 5 MG tablet Take 1 tablet (5 mg total) by mouth at bedtime as needed for sleep. 30 tablet 1   No current facility-administered medications for this visit.     Allergies:   Lamictal [lamotrigine], Penicillins, and Codeine   Social History:  The patient  reports that she quit smoking about 34 years ago. Her smoking use included cigarettes. She has never used smokeless tobacco. She reports that she does not currently use alcohol after a past usage of about 35.0 standard drinks of alcohol per week. She reports that she does not use drugs.   Family History:   family history includes  Alcohol abuse in her brother and sister; Alzheimer's disease in her maternal grandmother, mother, and paternal grandmother; Cancer in her father, maternal grandfather, and paternal grandfather; Depression in her brother and sister; Diabetes in her maternal grandfather, maternal grandmother, paternal grandfather, and paternal grandmother; Hypertension in her father; Liver disease in her maternal grandfather; Stroke in her mother.    Review of Systems: Review of Systems  Constitutional: Negative.   HENT: Negative.    Respiratory: Negative.    Cardiovascular: Negative.   Gastrointestinal: Negative.   Musculoskeletal: Negative.   Neurological: Negative.   Psychiatric/Behavioral: Negative.    All other systems reviewed and are negative.    PHYSICAL EXAM: VS:  BP 130/80 (BP Location: Right Arm,  Patient Position: Sitting, Cuff Size: Normal)   Pulse (!) 59   Ht '5\' 2"'$  (1.575 m)   Wt 142 lb 6.4 oz (64.6 kg)   SpO2 96%   BMI 26.05 kg/m  , BMI Body mass index is 26.05 kg/m. GEN: Well nourished, well developed, in no acute distress HEENT: normal Neck: no JVD, carotid bruits, or masses Cardiac: RRR; no murmurs, rubs, or gallops,no edema  Respiratory:  clear to auscultation bilaterally, normal work of breathing GI: soft, nontender, nondistended, + BS MS: no deformity or atrophy Skin: warm and dry, no rash Neuro:  Strength and sensation are intact Psych: euthymic mood, full affect  Recent Labs: 07/19/2021: ALT 38; BUN 13; Creatinine, Ser 0.75; Hemoglobin 12.0; Platelets 177.0; Potassium 4.7; Sodium 142; TSH 3.83    Lipid Panel Lab Results  Component Value Date   CHOL 139 07/19/2021   HDL 59.80 07/19/2021   LDLCALC 70 07/19/2021   TRIG 45.0 07/19/2021      Wt Readings from Last 3 Encounters:  11/02/21 142 lb 6.4 oz (64.6 kg)  10/25/21 148 lb 6.4 oz (67.3 kg)  10/25/21 146 lb (66.2 kg)      ASSESSMENT AND PLAN:  Problem List Items Addressed This Visit       Cardiology  Problems   Essential hypertension - Primary     Other   Dizziness   Chest pain  Atypical sx, more musculoskeletal sounding on her description Presenting at rest, fleeting lasting several seconds, radiating around the left flank to her back that resolved without intervention not associated with exertion - few risk factors for underlying coronary disease Echocardiogram ordered to establish baseline cardiac function  Leg swelling Puffiness noted in her legs, Nonpitting leg swelling, likely dependant edema Appears Noncardiac Echo ordered as above Recommended compression hose, leg elevation  Toe cramping Also reports having occasional cramping in her fingers Good extremity pulses on exam, does not appear to have significant PAD  Essential hypertension Blood pressure is well controlled on today's visit. No changes made to the medications.    Total encounter time more than 50 minutes  Greater than 50% was spent in counseling and coordination of care with the patient    Signed, Esmond Plants, M.D., Ph.D. [place], [place]

## 2021-11-02 ENCOUNTER — Ambulatory Visit: Payer: Commercial Managed Care - HMO | Attending: Internal Medicine | Admitting: Cardiovascular Disease

## 2021-11-02 ENCOUNTER — Other Ambulatory Visit: Payer: Self-pay

## 2021-11-02 ENCOUNTER — Encounter: Payer: Self-pay | Admitting: Cardiovascular Disease

## 2021-11-02 VITALS — BP 130/80 | HR 59 | Ht 62.0 in | Wt 142.4 lb

## 2021-11-02 DIAGNOSIS — R42 Dizziness and giddiness: Secondary | ICD-10-CM | POA: Diagnosis not present

## 2021-11-02 DIAGNOSIS — R079 Chest pain, unspecified: Secondary | ICD-10-CM | POA: Diagnosis not present

## 2021-11-02 DIAGNOSIS — I10 Essential (primary) hypertension: Secondary | ICD-10-CM

## 2021-11-02 DIAGNOSIS — M7989 Other specified soft tissue disorders: Secondary | ICD-10-CM | POA: Diagnosis not present

## 2021-11-02 NOTE — Patient Instructions (Addendum)
Medication Instructions:  No changes  If you need a refill on your cardiac medications before your next appointment, please call your pharmacy.    Lab work: No new labs needed   Testing/Procedures:  1) Echocardiogram (for chest pains, leg swelling) - Your physician has requested that you have an echocardiogram. Echocardiography is a painless test that uses sound waves to create images of your heart. It provides your doctor with information about the size and shape of your heart and how well your heart's chambers and valves are working. This procedure takes approximately one hour. There are no restrictions for this procedure. There is a possibility that an IV may need to be started during your test to inject an image enhancing agent. This is done to obtain more optimal pictures of your heart. Therefore we ask that you do at least drink some water prior to coming in to hydrate your veins.     Follow-Up: At [facility], you and your health needs are our priority.  As part of our continuing mission to provide you with exceptional heart care, we have created designated Provider Care Teams.  These Care Teams include your primary Cardiologist (physician) and Advanced Practice Providers (APPs -  Physician Assistants and Nurse Practitioners) who all work together to provide you with the care you need, when you need it.  You will need a follow up appointment as needed  Providers on your designated Care Team:   Murray Hodgkins, NP Christell Faith, PA-C Cadence Kathlen Mody, [place]  COVID-19 Vaccine Information can be found at: [email or web address].uk For questions related to vaccine distribution or appointments, please email [email or web address] or call [phone].    Echocardiogram An echocardiogram is a test that uses sound waves (ultrasound) to produce images of the heart. Images from an echocardiogram can provide important information  about: Heart size and shape. The size and thickness and movement of your heart's walls. Heart muscle function and strength. Heart valve function or if you have stenosis. Stenosis is when the heart valves are too narrow. If blood is flowing backward through the heart valves (regurgitation). A tumor or infectious growth around the heart valves. Areas of heart muscle that are not working well because of poor blood flow or injury from a heart attack. Aneurysm detection. An aneurysm is a weak or damaged part of an artery wall. The wall bulges out from the normal force of blood pumping through the body. Tell a health care provider about: Any allergies you have. All medicines you are taking, including vitamins, herbs, eye drops, creams, and over-the-counter medicines. Any blood disorders you have. Any surgeries you have had. Any medical conditions you have. Whether you are pregnant or may be pregnant. What are the risks? Generally, this is a safe test. However, problems may occur, including an allergic reaction to dye (contrast) that may be used during the test. What happens before the test? No specific preparation is needed. You may eat and drink normally. What happens during the test?  You will take off your clothes from the waist up and put on a hospital gown. Electrodes or electrocardiogram (ECG)patches may be placed on your chest. The electrodes or patches are then connected to a device that monitors your heart rate and rhythm. You will lie down on a table for an ultrasound exam. A gel will be applied to your chest to help sound waves pass through your skin. A handheld device, called a transducer, will be pressed against your chest and moved over your heart. The transducer  produces sound waves that travel to your heart and bounce back (or "echo" back) to the transducer. These sound waves will be captured in real-time and changed into images of your heart that can be viewed on a video monitor.  The images will be recorded on a computer and reviewed by your health care provider. You may be asked to change positions or hold your breath for a short time. This makes it easier to get different views or better views of your heart. In some cases, you may receive contrast through an IV in one of your veins. This can improve the quality of the pictures from your heart. The procedure may vary among health care providers and hospitals. What can I expect after the test? You may return to your normal, everyday life, including diet, activities, and medicines, unless your health care provider tells you not to do that. Follow these instructions at home: It is up to you to get the results of your test. Ask your health care provider, or the department that is doing the test, when your results will be ready. Keep all follow-up visits. This is important. Summary An echocardiogram is a test that uses sound waves (ultrasound) to produce images of the heart. Images from an echocardiogram can provide important information about the size and shape of your heart, heart muscle function, heart valve function, and other possible heart problems. You do not need to do anything to prepare before this test. You may eat and drink normally. After the echocardiogram is completed, you may return to your normal, everyday life, unless your health care provider tells you not to do that. This information is not intended to replace advice given to you by your health care provider. Make sure you discuss any questions you have with your health care provider. Document Revised: 09/22/2020 Document Reviewed: 09/02/2019 Elsevier Patient Education  [place].

## 2021-11-14 ENCOUNTER — Other Ambulatory Visit (INDEPENDENT_AMBULATORY_CARE_PROVIDER_SITE_OTHER): Payer: Commercial Managed Care - HMO

## 2021-11-14 DIAGNOSIS — R413 Other amnesia: Secondary | ICD-10-CM | POA: Diagnosis not present

## 2021-11-14 LAB — COMPREHENSIVE METABOLIC PANEL
ALT: 21 U/L (ref 0–35)
AST: 26 U/L (ref 0–37)
Albumin: 4 g/dL (ref 3.5–5.2)
Alkaline Phosphatase: 65 U/L (ref 39–117)
BUN: 14 mg/dL (ref 6–23)
CO2: 29 mEq/L (ref 19–32)
Calcium: 9.2 mg/dL (ref 8.4–10.5)
Chloride: 105 mEq/L (ref 96–112)
Creatinine, Ser: 0.85 mg/dL (ref 0.40–1.20)
GFR: 76.49 mL/min (ref >60.00)
Glucose, Bld: 51 mg/dL — ABNORMAL LOW (ref 70–99)
Potassium: 3.8 mEq/L (ref 3.5–5.1)
Sodium: 141 mEq/L (ref 135–145)
Total Bilirubin: 0.4 mg/dL (ref 0.2–1.2)
Total Protein: 6.5 g/dL (ref 6.0–8.3)

## 2021-11-14 LAB — VITAMIN B12: Vitamin B-12: 868 pg/mL (ref 211–911)

## 2021-11-14 LAB — CBC WITH DIFFERENTIAL/PLATELET
Basophils Absolute: 0.1 10*3/uL (ref 0.0–0.1)
Basophils Relative: 1.1 % (ref 0.0–3.0)
Eosinophils Absolute: 0.1 10*3/uL (ref 0.0–0.7)
Eosinophils Relative: 2.8 % (ref 0.0–5.0)
HCT: 38.8 % (ref 36.0–46.0)
Hemoglobin: 12.9 g/dL (ref 12.0–15.0)
Lymphocytes Relative: 31.4 % (ref 12.0–46.0)
Lymphs Abs: 1.6 10*3/uL (ref 0.7–4.0)
MCHC: 33.1 g/dL (ref 30.0–36.0)
MCV: 87.1 fl (ref 78.0–100.0)
Monocytes Absolute: 0.5 10*3/uL (ref 0.1–1.0)
Monocytes Relative: 9.7 % (ref 3.0–12.0)
Neutro Abs: 2.7 10*3/uL (ref 1.4–7.7)
Neutrophils Relative %: 55 % (ref 43.0–77.0)
Platelets: 203 10*3/uL (ref 150.0–400.0)
RBC: 4.46 Mil/uL (ref 3.87–5.11)
RDW: 14 % (ref 11.5–15.5)
WBC: 5 10*3/uL (ref 4.0–10.5)

## 2021-11-14 LAB — VITAMIN D 25 HYDROXY (VIT D DEFICIENCY, FRACTURES): VITD: 52.8 ng/mL (ref 30.00–100.00)

## 2021-11-14 LAB — TSH: TSH: 3.37 u[IU]/mL (ref 0.35–5.50)

## 2021-11-17 ENCOUNTER — Ambulatory Visit (INDEPENDENT_AMBULATORY_CARE_PROVIDER_SITE_OTHER): Payer: Commercial Managed Care - HMO | Admitting: Psychiatry

## 2021-11-17 ENCOUNTER — Encounter: Payer: Self-pay | Admitting: Psychiatry

## 2021-11-17 ENCOUNTER — Other Ambulatory Visit: Payer: Self-pay

## 2021-11-17 VITALS — BP 118/76 | HR 64 | Temp 98.1°F | Ht 62.0 in | Wt 145.6 lb

## 2021-11-17 DIAGNOSIS — F4310 Post-traumatic stress disorder, unspecified: Secondary | ICD-10-CM | POA: Diagnosis not present

## 2021-11-17 DIAGNOSIS — G2581 Restless legs syndrome: Secondary | ICD-10-CM

## 2021-11-17 DIAGNOSIS — F4010 Social phobia, unspecified: Secondary | ICD-10-CM

## 2021-11-17 DIAGNOSIS — F1021 Alcohol dependence, in remission: Secondary | ICD-10-CM

## 2021-11-17 DIAGNOSIS — F331 Major depressive disorder, recurrent, moderate: Secondary | ICD-10-CM

## 2021-11-17 MED ORDER — BUSPIRONE HCL 15 MG PO TABS
15.0000 mg | ORAL_TABLET | Freq: Two times a day (BID) | ORAL | 0 refills | Status: DC
  Filled 2021-11-17 – 2021-12-15 (×2): qty 180, 90d supply, fill #0

## 2021-11-17 MED ORDER — ESCITALOPRAM OXALATE 20 MG PO TABS
20.0000 mg | ORAL_TABLET | Freq: Every day | ORAL | 0 refills | Status: DC
  Filled 2021-11-17 – 2021-12-15 (×2): qty 90, 90d supply, fill #0

## 2021-11-17 MED ORDER — HYDROXYZINE PAMOATE 25 MG PO CAPS
25.0000 mg | ORAL_CAPSULE | Freq: Two times a day (BID) | ORAL | 1 refills | Status: DC | PRN
  Filled 2021-11-17: qty 60, 30d supply, fill #0
  Filled 2022-01-24: qty 60, 30d supply, fill #1

## 2021-11-17 NOTE — Progress Notes (Signed)
[facility] MD OP Progress Note  11/17/2021 10:06 AM [name]  MRN:  [id number]  Chief Complaint:  Chief Complaint  Patient presents with   Follow-up   Anxiety   Depression   HPI: [name] is a 56 year old Caucasian female with history of PTSD, MDD, social anxiety disorder, RLS, alcohol use disorder in remission, attention and concentration deficit, chronic back pain, married, [place] was evaluated in office today.  Patient today reports the Abilify as beneficial.  She however continues to have multiple situational stressors.  Reports she has a lot of racing thoughts about her relationship struggles, has low self-esteem-has struggled with that all her life.  Patient reports she also struggles with her financial situation and hence does not have a lot of activity she can do due to the same.  That worries her.  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  Currently continues to work with her therapist Ms. Christina Hussami.  Patient reports sleep is overall okay.  Goes to bed at around 8 PM and wakes up at around for 4:30 AM.  Hence gets around 7 to 8 hours of sleep at night.  Does take the Ambien as needed.  Which helps.  Patient denies any side effects to any of her medications and reports she is compliant.  Does report dizziness which she has been struggling with since the past 1 year, unrelated to her current medications including Abilify.  Patient currently follows up with cardiology.  Denies any suicidality, homicidality or perceptual disturbances.  Patient denies any other concerns today.  Visit Diagnosis:    ICD-10-CM   1. PTSD (post-traumatic stress disorder)  F43.10 busPIRone (BUSPAR) 15 MG tablet    escitalopram (LEXAPRO) 20 MG tablet    hydrOXYzine (VISTARIL) 25 MG capsule    2. MDD (major depressive disorder), recurrent episode, moderate (HCC)  F33.1 busPIRone (BUSPAR) 15 MG tablet    escitalopram  (LEXAPRO) 20 MG tablet    hydrOXYzine (VISTARIL) 25 MG capsule    3. Social anxiety disorder  F40.10 hydrOXYzine (VISTARIL) 25 MG capsule    4. RLS (restless legs syndrome)  G25.81     5. Alcohol use disorder, severe, in sustained remission ([place])  F10.21       Past Psychiatric History: Reviewed past psychiatric history from progress note on 04/04/2021.  Past trials of medications-Wellbutrin, Zoloft, Xanax, Cymbalta, Pristiq, BuSpar, Deplin, Valium, Viibryd, Trintellix, Lamictal.  Patient with neuropsychological testing completed-05/05/2021-Dr. Rodenbough-does not meet criteria for ADHD.  Past Medical History:  Past Medical History:  Diagnosis Date   Allergy    Anemia    Anxiety    Asthma    Depression    GERD (gastroesophageal reflux disease)    Heart murmur    from childhood   Hiatal hernia    Hypertension    Peptic ulcer    PTSD (post-traumatic stress disorder)    UTI (urinary tract infection)     Past Surgical History:  Procedure Laterality Date   ABDOMINAL HYSTERECTOMY     fibroids   BIOPSY  07/11/2018   Procedure: BIOPSY;  Surgeon: [name], MD;  Location: [facility];  Service: Endoscopy;;   COLONOSCOPY  2014   ESOPHAGOGASTRODUODENOSCOPY (EGD) WITH PROPOFOL N/A 07/11/2018   Procedure: ESOPHAGOGASTRODUODENOSCOPY (EGD) WITH PROPOFOL;  Surgeon: [name], MD;  Location: [facility];  Service: Endoscopy;  Laterality: N/A;   GASTRIC BYPASS  2007   HEMOSTASIS  CLIP PLACEMENT  07/11/2018   Procedure: HEMOSTASIS CLIP PLACEMENT;  Surgeon: [name], MD;  Location: [facility];  Service: Endoscopy;;   LAPAROTOMY N/A 03/08/2018   Procedure: EXPLORATORY LAPAROTOMY, REPAIR AND PATCH CLOSURE OF PERFORATED ULCER;  Surgeon: Georganna Skeans, MD;  Location: [facility];  Service: General;  Laterality: N/A;   TUBAL LIGATION      Family Psychiatric History: Reviewed family psychiatric history from progress note on 04/04/2021.  Family History:  Family History  Problem  Relation Age of Onset   Stroke Mother    Alzheimer's disease Mother    Hypertension Father    Cancer Father        Prostate   Alcohol abuse Sister    Depression Sister    Alcohol abuse Brother    Depression Brother    Diabetes Maternal Grandfather    Liver disease Maternal Grandfather    Cancer Maternal Grandfather        Prostate   Diabetes Maternal Grandmother    Alzheimer's disease Maternal Grandmother    Diabetes Paternal Grandfather    Cancer Paternal Grandfather        Lung, Prostate   Diabetes Paternal Grandmother    Alzheimer's disease Paternal Grandmother    Colon cancer Neg Hx    Esophageal cancer Neg Hx    Pancreatic cancer Neg Hx    Stomach cancer Neg Hx    Colon polyps Neg Hx    Rectal cancer Neg Hx     Social History: Reviewed social history from progress note on 04/04/2021. Social History   Socioeconomic History   Marital status: Married    Spouse name: mark   Number of children: 0   Years of education: Not on file   Highest education level: High school graduate  Occupational History    Employer: [employer]    Comment: [employer]  Tobacco Use   Smoking status: Former    Types: Cigarettes    Quit date: 01/24/1987    Years since quitting: 34.8   Smokeless tobacco: Never  Vaping Use   Vaping Use: Former   Substances: Flavoring   Devices: has vaped with flavor but no nicotine. Been 8 years  Substance and Sexual Activity   Alcohol use: Not Currently    Alcohol/week: 35.0 standard drinks of alcohol    Types: 35 Cans of beer per week   Drug use: No   Sexual activity: Not Currently  Other Topics Concern   Not on file  Social History Narrative   Regular exercise--no (because of knees)   Social Determinants of Health   Financial Resource Strain: Not on file  Food Insecurity: Not on file  Transportation Needs: Not on file  Physical Activity: Not on file  Stress: Not on file  Social Connections: Not on file    Allergies:  Allergies   Allergen Reactions   Lamictal [Lamotrigine] Rash   Penicillins Other (See Comments)    Did it involve swelling of the face/tongue/throat, SOB, or low BP? Unk Did it involve sudden or severe rash/hives, skin peeling, or any reaction on the inside of your mouth or nose? Unk Did you need to seek medical attention at a hospital or doctor's office? Unk When did it last happen? Was 56 years old; reaction not recalled     If all above answers are "NO", may proceed with cephalosporin use.    Codeine Itching    Crazy dreams and itching-pt said she can take it with food    Metabolic  Disorder Labs: No results found for: "HGBA1C", "MPG" No results found for: "PROLACTIN" Lab Results  Component Value Date   CHOL 139 07/19/2021   TRIG 45.0 07/19/2021   HDL 59.80 07/19/2021   CHOLHDL 2 07/19/2021   VLDL 9.0 07/19/2021   LDLCALC 70 07/19/2021   LDLCALC 112 (H) 09/23/2020   Lab Results  Component Value Date   TSH 3.37 11/14/2021   TSH 3.83 07/19/2021    Therapeutic Level Labs: No results found for: "LITHIUM" No results found for: "VALPROATE" No results found for: "CBMZ"  Current Medications: Current Outpatient Medications  Medication Sig Dispense Refill   Acetaminophen (TYLENOL 8 HOUR ARTHRITIS PAIN PO) Take 2 tablets by mouth as needed.     ARIPiprazole (ABILIFY) 2 MG tablet Take 1 tablet (2 mg total) by mouth in the morning. 30 tablet 1   Cholecalciferol (VITAMIN D3) 50 MCG (2000 UT) capsule Take 1 capsule (2,000 Units total) by mouth daily. 100 capsule 0   colestipol (COLESTID) 1 g tablet Take 1 tablet (1 g total) by mouth 2 (two) times daily. 60 tablet 2   cyanocobalamin (VITAMIN B12) 1000 MCG/ML injection Inject 1 mL (1,000 mcg total) into the muscle every 30 (thirty) days. 10 mL 0   cyclobenzaprine (FLEXERIL) 10 MG tablet Take 1 tablet (10 mg total) by mouth 3 (three) times daily as needed. for muscle spams 30 tablet 0   Diclofenac Sodium 2 % SOLN Apply 1 pump twice daily as  needed. 112 g 1   estradiol (ESTRACE) 0.1 MG/GM vaginal cream Apply a pea-sized amount of cream to the fingertip and wipe in the front part of the vagina twice weekly 42.5 g 0   estradiol (VIVELLE-DOT) 0.1 MG/24HR patch Place 1 patch (0.1 mg total) onto the skin 2 (two) times a week. 8 patch 5   fluticasone (FLONASE) 50 MCG/ACT nasal spray Place 2 sprays into both nostrils daily. 16 g 5   furosemide (LASIX) 40 MG tablet Take 1 tablet (40 mg total) by mouth daily. 90 tablet 1   Homeopathic Products (ALLERGY MEDICINE PO) Take 1 tablet by mouth as needed (Takes the Clortabs. Equate Brand).     HYDROcodone-acetaminophen (NORCO/VICODIN) 5-325 MG tablet Take 1 tablet by mouth every 6 (six) hours as needed. 15 tablet 0   loperamide (IMODIUM) 2 MG capsule Take 2 mg by mouth daily as needed.      omeprazole (PRILOSEC) 40 MG capsule TAKE 1 CAPSULE BY MOUTH TWICE DAILY. OPEN CAPSULE AND SPRINKLE ON APPLESAUCE. 180 capsule 0   Probiotic Product (ALIGN) CHEW Chew 2 tablets by mouth daily. Align pre and probiotic with vitamain C-take one tablet each Align pre and probiotic with Vitamin b12 for immune and energy-take one tablet each     rOPINIRole (REQUIP) 1 MG tablet TAKE 1 TABLET (1 MG TOTAL) BY MOUTH AT BEDTIME. 90 tablet 1   SYRINGE-NEEDLE, DISP, 3 ML (B-D 3CC LUER-LOK SYR 25GX5/8") 25G X 5/8" 3 ML MISC USE AS DIRECTED WITH B-12 INJECTION 16 each 0   VITAMIN A PO Take by mouth.     zolpidem (AMBIEN) 5 MG tablet Take 1 tablet (5 mg total) by mouth at bedtime as needed for sleep. 30 tablet 1   busPIRone (BUSPAR) 15 MG tablet Take 1 tablet by mouth 2 (two) times daily. Take daily at 9 AM and at 4 PM 180 tablet 0   escitalopram (LEXAPRO) 20 MG tablet Take 1 tablet (20 mg total) by mouth daily. 90 tablet 0   hydrOXYzine (  VISTARIL) 25 MG capsule Take 1 capsule (25 mg total) by mouth 2 (two) times daily as needed for anxiety. And sleep, racing thoughts 60 capsule 1   No current facility-administered medications for  this visit.     Musculoskeletal: Strength & Muscle Tone: within normal limits Gait & Station: normal Patient leans: N/A  Psychiatric Specialty Exam: Review of Systems  Musculoskeletal:  Positive for back pain.  Psychiatric/Behavioral:  Positive for dysphoric mood. The patient is nervous/anxious.   All other systems reviewed and are negative.   Blood pressure 118/76, pulse 64, temperature 98.1 F (36.7 C), temperature source Oral, height '5\' 2"'$  (1.575 m), weight 145 lb 9.6 oz (66 kg).Body mass index is 26.63 kg/m.  General Appearance: Casual  Eye Contact:  Good  Speech:  Clear and Coherent  Volume:  Normal  Mood:  Anxious and Depressed  Affect:  Tearful  Thought Process:  Goal Directed and Descriptions of Associations: Intact  Orientation:  Full (Time, Place, and Person)  Thought Content: Logical   Suicidal Thoughts:  No  Homicidal Thoughts:  No  Memory:  Immediate;   Fair Recent;   Fair Remote;   Fair  Judgement:  Fair  Insight:  Fair  Psychomotor Activity:  Normal  Concentration:  Concentration: Fair and Attention Span: Fair  Recall:  [employer] of Knowledge: Fair  Language: Fair  Akathisia:  No  Handed:  Right  AIMS (if indicated): done  Assets:  Communication Skills Desire for Improvement Housing Talents/Skills Transportation  ADL's:  Intact  Cognition: WNL  Sleep:  Fair   Screenings: [place] Office Visit from 11/17/2021 in [facility] Office Visit from 06/13/2021 in [facility] Visit from 05/25/2021 in [facility] Total Score 0 0 0      [place] Visit from 11/17/2021 in [facility] Visit from 10/19/2021 in [facility] from 09/15/2021 in [facility] from 04/11/2021 in [facility] Visit from 04/04/2021 in [facility]  Total GAD-7 Score '20 19 19 15 16      '$ PHQ2-9    [place] Visit from 11/17/2021 in [place] [facility] [place] Office Visit from 10/25/2021 in [facility] [place] Visit from 10/19/2021 in [facility] Visit from 09/15/2021 in [facility] Visit from 08/03/2021 in [facility]  PHQ-2 Total Score 5 0 '6 1 6  '$ PHQ-9 Total Score 21 -- '23 12 22      '$ [place] Office Visit from 11/17/2021 in [facility] [place] Office Visit from 10/19/2021 in [facility] Counselor from 10/13/2021 in [facility] Low Risk Low Risk No Risk        Assessment and Plan: [name] is a 56 year old Caucasian female with history of PTSD, MDD, social anxiety, alcohol use disorder in remission, chronic pain was evaluated in office today.  Patient with improvement on the Abilify although continues to struggle with mood symptoms, interpersonal relationship problems, comorbid chronic pain, will benefit from the following plan.  Plan PTSD-improving Lexapro 20 mg p.o. daily BuSpar 15 mg p.o. twice daily-reduced dosage Ambien 2.5-5 mg p.o. nightly as needed Reviewed [place] PMP AWARxE  MDD-some improvement Abilify 2 mg p.o. daily-will continue the same dose for  now. Lexapro 20 mg p.o. daily Hydroxyzine 25 mg p.o. twice daily as needed for severe anxiety Continue CBT-patient has upcoming appointment with Ms. Christina Hussami. EKG reviewed with patient-dated 10/19/2021-[id number].  Social anxiety disorder-improving Continue CBT Lexapro 20 mg p.o. daily Hydroxyzine as noted above  Restless leg syndrome-improving Requip 1 mg p.o. nightly  Alcohol use disorder in remission Sober since  February 2022.  Follow-up in clinic in 6 to 8 weeks or sooner if needed.  This note was generated in part or whole with voice recognition software. Voice recognition is usually quite accurate but there are transcription errors that can and very often do occur. I apologize for any typographical errors that were not detected and corrected.    Ursula Alert, MD 11/18/2021, 8:34 AM

## 2021-11-18 ENCOUNTER — Ambulatory Visit (INDEPENDENT_AMBULATORY_CARE_PROVIDER_SITE_OTHER): Payer: Commercial Managed Care - HMO | Admitting: Licensed Clinical Social Worker

## 2021-11-18 ENCOUNTER — Other Ambulatory Visit: Payer: Self-pay

## 2021-11-18 DIAGNOSIS — F4310 Post-traumatic stress disorder, unspecified: Secondary | ICD-10-CM | POA: Diagnosis not present
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
## 2021-11-21 NOTE — Plan of Care (Signed)
  Problem: Reduce the negative impact trauma related symptoms have on social, occupational, and family functioning per pt self report 3 out of 5 sessions documented. PTSD-Trauma Disorder CCP Problem  1  Goal: LTG: Reduce frequency, intensity, and duration of PTSD symptoms so daily functioning is improved: Input needed on appropriate metric.  per pt self report Outcome: Progressing Goal: STG: Increase participation in previously avoided activities 3 out of 5 sessions documented Outcome: Progressing Intervention: REVIEW PLEASE SKILLS (TREAT PHYSICAL ILLNESS, BALANCE EATING, AVOID MOOD-ALTERING SUBSTANCES, BALANCE SLEEP AND GET EXERCISE) WITH [name] Note: Reviewed  Intervention: Assist with relaxation techniques, as appropriate (deep breathing exercises, meditation, guided imagery) Note: Reviewed

## 2021-11-22 ENCOUNTER — Other Ambulatory Visit: Payer: Self-pay

## 2021-11-29 ENCOUNTER — Ambulatory Visit: Payer: Commercial Managed Care - HMO | Admitting: Family Medicine

## 2021-12-03 ENCOUNTER — Other Ambulatory Visit: Payer: Self-pay | Admitting: Gastroenterology

## 2021-12-05 ENCOUNTER — Other Ambulatory Visit: Payer: Self-pay

## 2021-12-05 MED ORDER — COLESTIPOL HCL 1 G PO TABS
1.0000 g | ORAL_TABLET | Freq: Two times a day (BID) | ORAL | 2 refills | Status: DC
  Filled 2021-12-05: qty 60, 30d supply, fill #0
  Filled 2022-01-20: qty 60, 30d supply, fill #1
  Filled 2022-03-27: qty 60, 30d supply, fill #2

## 2021-12-06 ENCOUNTER — Other Ambulatory Visit: Payer: Self-pay

## 2021-12-07 NOTE — Progress Notes (Signed)
12/08/2021 6:25 PM   [name] [name] Sep 28, 1965 [id number]  Referring provider: Carollee Herter, Alferd Apa, DO [address] [place],  [place] [address]  Urological history: 1. rUTI's -contributing factors of vaginal atrophy, IBS and incomplete bladder emptying -RUS, 03/2021 small left renal cyst  -documented positive urine cultures over the last year  MDRO E.coli 03/10/2021  E.coli 01/31/2021 -managed with topical estrogen cream, pro-biotics, D-mannose and cranberry tablets    Chief Complaint  Patient presents with   Recurrent UTI   Vaginal Atrophy   Urinary Incontinence    HPI: [name] is a 56 y.o. female who presents today for a 6 month follow up.  At her visit 6 months ago, she was experiencing 1-7 daytime urinations, 1-2 nighttime urinations and no urgency.  She is experiencing stress incontinence leaking 1-2 times weekly.  She is not wearing any absorbent products for her leakage.  She does engage in toilet mapping.  Today, she is having 1-7 daytime voids, 1-2 episodes of nocturia and no urge to urinate.  She has incontinence with both stress and urge.  She leaks urine 1-2 times weekly and will wear panty liners on occasion.  She does engage in toilet mapping.  She has been having a disturbing of currents of passage of air from her vaginal introitus after she has been sitting and rising to a standing position and sometimes while walking.  She denies any vaginal bulge or vaginal discharge.  She denies any pneumaturia or fecal material coming from her vagina or bladder.   Patient denies any modifying or aggravating factors.  Patient denies any gross hematuria, dysuria or suprapubic/flank pain.  Patient denies any fevers, chills, nausea or vomiting.    PVR 82 mL  PMH: Past Medical History:  Diagnosis Date   Allergy    Anemia    Anxiety    Asthma    Depression    GERD (gastroesophageal reflux disease)    Heart murmur    from childhood   Hiatal  hernia    Hypertension    Peptic ulcer    PTSD (post-traumatic stress disorder)    UTI (urinary tract infection)     Surgical History: Past Surgical History:  Procedure Laterality Date   ABDOMINAL HYSTERECTOMY     fibroids   BIOPSY  07/11/2018   Procedure: BIOPSY;  Surgeon: [name], MD;  Location: [place] [facility];  Service: Endoscopy;;   COLONOSCOPY  2014   ESOPHAGOGASTRODUODENOSCOPY (EGD) WITH PROPOFOL N/A 07/11/2018   Procedure: ESOPHAGOGASTRODUODENOSCOPY (EGD) WITH PROPOFOL;  Surgeon: [name], MD;  Location: [facility];  Service: Endoscopy;  Laterality: N/A;   GASTRIC BYPASS  2007   HEMOSTASIS CLIP PLACEMENT  07/11/2018   Procedure: HEMOSTASIS CLIP PLACEMENT;  Surgeon: [name], MD;  Location: [facility];  Service: Endoscopy;;   LAPAROTOMY N/A 03/08/2018   Procedure: EXPLORATORY LAPAROTOMY, REPAIR AND PATCH CLOSURE OF PERFORATED ULCER;  Surgeon: Georganna Skeans, MD;  Location: [facility];  Service: General;  Laterality: N/A;   TUBAL LIGATION      Home Medications:  Allergies as of 12/08/2021       Reactions   Lamictal [lamotrigine] Rash   Penicillins Other (See Comments)   Did it involve swelling of the face/tongue/throat, SOB, or low BP? Unk Did it involve sudden or severe rash/hives, skin peeling, or any reaction on the inside of your mouth or nose? Unk Did you need to seek medical attention at a hospital or doctor's office? Unk  When did it last happen? Was 56 years old; reaction not recalled     If all above answers are "NO", may proceed with cephalosporin use.   Codeine Itching   Crazy dreams and itching-pt said she can take it with food        Medication List        Accurate as of December 08, 2021 11:59 PM. If you have any questions, ask your nurse or doctor.          Align Google 2 tablets by mouth daily. Align pre and probiotic with vitamain C-take one tablet each Align pre and probiotic with Vitamin b12 for immune and energy-take  one tablet each   ALLERGY MEDICINE PO Take 1 tablet by mouth as needed (Takes the Clortabs. Equate Brand).   ARIPiprazole 2 MG tablet Commonly known as: Abilify Take 1 tablet (2 mg total) by mouth in the morning.   B-D 3CC LUER-LOK SYR 25GX5/8" 25G X 5/8" 3 ML Misc Generic drug: SYRINGE-NEEDLE (DISP) 3 ML USE AS DIRECTED WITH B-12 INJECTION   busPIRone 15 MG tablet Commonly known as: BUSPAR Take 1 tablet by mouth 2 (two) times daily. Take daily at 9 AM and at 4 PM   colestipol 1 g tablet Commonly known as: COLESTID Take 1 tablet (1 g total) by mouth 2 (two) times daily. Please schedule an office visit for further refills. Thank you   cyanocobalamin 1000 MCG/ML injection Commonly known as: VITAMIN B12 Inject 1 mL (1,000 mcg total) into the muscle every 30 (thirty) days.   cyclobenzaprine 10 MG tablet Commonly known as: FLEXERIL Take 1 tablet (10 mg total) by mouth 3 (three) times daily as needed. for muscle spams   Diclofenac Sodium 2 % Soln Apply 1 pump twice daily as needed.   escitalopram 20 MG tablet Commonly known as: Lexapro Take 1 tablet (20 mg total) by mouth daily.   estradiol 0.1 MG/24HR patch Commonly known as: Vivelle-Dot Place 1 patch (0.1 mg total) onto the skin 2 (two) times a week.   estradiol 0.1 MG/GM vaginal cream Commonly known as: ESTRACE Apply a pea-sized amount of cream to the fingertip and wipe in the front part of the vagina twice weekly   fluticasone 50 MCG/ACT nasal spray Commonly known as: FLONASE Place 2 sprays into both nostrils daily.   furosemide 40 MG tablet Commonly known as: LASIX Take 1 tablet (40 mg total) by mouth daily.   HYDROcodone-acetaminophen 5-325 MG tablet Commonly known as: NORCO/VICODIN Take 1 tablet by mouth every 6 (six) hours as needed.   hydrOXYzine 25 MG capsule Commonly known as: Vistaril Take 1 capsule (25 mg total) by mouth 2 (two) times daily as needed for anxiety. And sleep, racing thoughts (Take 1  capsule (25 mg total) by mouth 2 (two) times daily as needed for anxiety. And sleep, racing thoughts)   loperamide 2 MG capsule Commonly known as: IMODIUM Take 2 mg by mouth daily as needed.   omeprazole 40 MG capsule Commonly known as: PRILOSEC TAKE 1 CAPSULE BY MOUTH TWICE DAILY. OPEN CAPSULE AND SPRINKLE ON APPLESAUCE.   rOPINIRole 1 MG tablet Commonly known as: REQUIP TAKE 1 TABLET (1 MG TOTAL) BY MOUTH AT BEDTIME.   SM Vitamin D3 50 MCG (2000 UT) Caps Generic drug: Cholecalciferol Take 1 capsule (2,000 Units total) by mouth daily.   TYLENOL 8 HOUR ARTHRITIS PAIN PO Take 2 tablets by mouth as needed.   VITAMIN A PO Take by mouth.   zolpidem  5 MG tablet Commonly known as: AMBIEN Take 1 tablet (5 mg total) by mouth at bedtime as needed for sleep.        Allergies:  Allergies  Allergen Reactions   Lamictal [Lamotrigine] Rash   Penicillins Other (See Comments)    Did it involve swelling of the face/tongue/throat, SOB, or low BP? Unk Did it involve sudden or severe rash/hives, skin peeling, or any reaction on the inside of your mouth or nose? Unk Did you need to seek medical attention at a hospital or doctor's office? Unk When did it last happen? Was 56 years old; reaction not recalled     If all above answers are "NO", may proceed with cephalosporin use.    Codeine Itching    Crazy dreams and itching-pt said she can take it with food    Family History: Family History  Problem Relation Age of Onset   Stroke Mother    Alzheimer's disease Mother    Hypertension Father    Cancer Father        Prostate   Alcohol abuse Sister    Depression Sister    Alcohol abuse Brother    Depression Brother    Diabetes Maternal Grandfather    Liver disease Maternal Grandfather    Cancer Maternal Grandfather        Prostate   Diabetes Maternal Grandmother    Alzheimer's disease Maternal Grandmother    Diabetes Paternal Grandfather    Cancer Paternal Grandfather         Lung, Prostate   Diabetes Paternal Grandmother    Alzheimer's disease Paternal Grandmother    Colon cancer Neg Hx    Esophageal cancer Neg Hx    Pancreatic cancer Neg Hx    Stomach cancer Neg Hx    Colon polyps Neg Hx    Rectal cancer Neg Hx     Social History:  reports that she quit smoking about 34 years ago. Her smoking use included cigarettes. She has never used smokeless tobacco. She reports that she does not currently use alcohol after a past usage of about 35.0 standard drinks of alcohol per week. She reports that she does not use drugs.  ROS: Pertinent ROS in HPI  Physical Exam: BP 127/83   Pulse 70   Ht '5\' 2"'$  (1.575 m)   Wt 144 lb (65.3 kg)   BMI 26.34 kg/m   Constitutional:  Well nourished. Alert and oriented, No acute distress. HEENT: [place] AT, moist mucus membranes.  Trachea midline, no masses. Cardiovascular: No clubbing, cyanosis, or edema. Respiratory: Normal respiratory effort, no increased work of breathing. GU: No CVA tenderness.  No bladder fullness or masses. Vulvovaginal atrophy w/ pallor and loss of rugae.   Vulvar thinning, fusion of labia, clitoral hood retraction, prominent urethral meatus.   No bladder fullness, tenderness or masses.  Fair pelvic support, no cystocele and no rectocele noted.  Anus and perineum are without rashes or lesions.     Neurologic: Grossly intact, no focal deficits, moving all 4 extremities. Psychiatric: Normal mood and affect.    Laboratory Data:    Latest Ref Rng & Units 11/14/2021    7:52 AM 07/19/2021   10:07 AM 01/31/2021    4:28 PM  CBC  WBC 4.0 - 10.5 K/uL 5.0  3.5  5.0   Hemoglobin 12.0 - 15.0 g/dL 12.9  12.0  12.7   Hematocrit 36.0 - 46.0 % 38.8  36.1  38.6   Platelets 150.0 - 400.0 K/uL 203.0  177.0  209.0        Latest Ref Rng & Units 11/14/2021    7:52 AM 07/19/2021   10:07 AM 01/31/2021    4:28 PM  CMP  Glucose 70 - 99 mg/dL 51  83  89   BUN 6 - 23 mg/dL '14  13  17   '$ Creatinine 0.40 - 1.20 mg/dL 0.85  0.75  0.99    Sodium 135 - 145 mEq/L 141  142  141   Potassium 3.5 - 5.1 mEq/L 3.8  4.7  5.3   Chloride 96 - 112 mEq/L 105  109  106   CO2 19 - 32 mEq/L '29  30  29   '$ Calcium 8.4 - 10.5 mg/dL 9.2  9.0  9.2   Total Protein 6.0 - 8.3 g/dL 6.5  5.9  6.9   Total Bilirubin 0.2 - 1.2 mg/dL 0.4  0.5  0.3   Alkaline Phos 39 - 117 U/L 65  78  66   AST 0 - 37 U/L 26  32  21   ALT 0 - 35 U/L 21  38  16   I have reviewed the labs.   Pertinent Imaging:  12/08/21 10:17  Scan Result 36m    Assessment & Plan:    1. rUTI's -Asymptomatic today's visit  2. Vaginal atrophy -Continue vaginal cream Monday, Wednesday, Friday nights  3. SUI/urge incontinence -Explained that the air coming from the vaginal area when she stands up or when she is walking is likely due to weak pelvic floor muscles and have encouraged her to seek evaluation at treatment with pelvic floor physical therapy -Referral to pelvic floor physical therapy  Return in about 6 months (around 06/08/2022) for OAB questionnaire, PVR and exam.  These notes generated with voice recognition software. I apologize for typographical errors.  [place] SR[place]an MountainBTilghmanton  [phone]

## 2021-12-08 ENCOUNTER — Other Ambulatory Visit: Payer: Self-pay

## 2021-12-08 ENCOUNTER — Ambulatory Visit: Payer: Commercial Managed Care - HMO | Admitting: Urology

## 2021-12-08 VITALS — BP 127/83 | HR 70 | Ht 62.0 in | Wt 144.0 lb

## 2021-12-08 DIAGNOSIS — Z87440 Personal history of urinary (tract) infections: Secondary | ICD-10-CM

## 2021-12-08 DIAGNOSIS — N393 Stress incontinence (female) (male): Secondary | ICD-10-CM | POA: Diagnosis not present

## 2021-12-08 DIAGNOSIS — N952 Postmenopausal atrophic vaginitis: Secondary | ICD-10-CM | POA: Diagnosis not present

## 2021-12-08 DIAGNOSIS — N390 Urinary tract infection, site not specified: Secondary | ICD-10-CM

## 2021-12-08 LAB — BLADDER SCAN AMB NON-IMAGING

## 2021-12-08 MED ORDER — ESTRADIOL 0.1 MG/GM VA CREA: TOPICAL_CREAM | VAGINAL | 0 refills | Status: AC

## 2021-12-09 ENCOUNTER — Encounter: Payer: Self-pay | Admitting: Urology

## 2021-12-13 ENCOUNTER — Ambulatory Visit: Payer: Commercial Managed Care - HMO | Admitting: Psychiatry

## 2021-12-13 NOTE — Progress Notes (Unsigned)
   I, Peterson Lombard, LAT, ATC acting as a scribe for [name], MD.  [name] is a 56 y.o. female who presents to [facility] at [facility] today for f/u R knee and R hip pain. Pt was last seen by [name] on 10/25/21 and was given a R knee steroid injection. Today, pt reports  Dx imaging: 10/25/21 R hip & R knee XR  Pertinent review of systems: ***  Relevant historical information: ***   Exam:  There were no vitals taken for this visit. General: Well Developed, well nourished, and in no acute distress.   MSK: ***    Lab and Radiology Results No results found for this or any previous visit (from the past 72 hour(s)). No results found.     Assessment and Plan: 56 y.o. female with ***   PDMP not reviewed this encounter. No orders of the defined types were placed in this encounter.  No orders of the defined types were placed in this encounter.    Discussed warning signs or symptoms. Please see discharge instructions. Patient expresses understanding.   ***

## 2021-12-14 ENCOUNTER — Ambulatory Visit: Payer: Commercial Managed Care - HMO | Admitting: Family Medicine

## 2021-12-14 VITALS — BP 122/80 | HR 63 | Ht 62.0 in | Wt 147.0 lb

## 2021-12-14 DIAGNOSIS — M25551 Pain in right hip: Secondary | ICD-10-CM | POA: Diagnosis not present

## 2021-12-14 DIAGNOSIS — M25561 Pain in right knee: Secondary | ICD-10-CM

## 2021-12-14 DIAGNOSIS — G8929 Other chronic pain: Secondary | ICD-10-CM | POA: Diagnosis not present

## 2021-12-14 NOTE — Patient Instructions (Signed)
Thank you for coming in today.   We could do more for the hip if needed.   Work on quad exercises.   Recheck as needed. I can repeat the injection.

## 2021-12-15 ENCOUNTER — Other Ambulatory Visit: Payer: Self-pay | Admitting: Psychiatry

## 2021-12-15 DIAGNOSIS — F331 Major depressive disorder, recurrent, moderate: Secondary | ICD-10-CM

## 2021-12-16 ENCOUNTER — Other Ambulatory Visit: Payer: Self-pay | Admitting: Psychiatry

## 2021-12-16 ENCOUNTER — Other Ambulatory Visit: Payer: Self-pay

## 2021-12-16 DIAGNOSIS — F331 Major depressive disorder, recurrent, moderate: Secondary | ICD-10-CM

## 2021-12-19 ENCOUNTER — Other Ambulatory Visit: Payer: Self-pay

## 2021-12-19 MED FILL — Aripiprazole Tab 2 MG: ORAL | 30 days supply | Qty: 30 | Fill #0 | Status: AC

## 2021-12-25 ENCOUNTER — Other Ambulatory Visit: Payer: Self-pay

## 2021-12-27 ENCOUNTER — Ambulatory Visit: Payer: Commercial Managed Care - HMO | Attending: Cardiovascular Disease

## 2021-12-27 DIAGNOSIS — R079 Chest pain, unspecified: Secondary | ICD-10-CM | POA: Diagnosis not present

## 2021-12-27 DIAGNOSIS — I361 Nonrheumatic tricuspid (valve) insufficiency: Secondary | ICD-10-CM

## 2021-12-27 DIAGNOSIS — M7989 Other specified soft tissue disorders: Secondary | ICD-10-CM | POA: Diagnosis not present

## 2021-12-27 LAB — ECHOCARDIOGRAM COMPLETE
AR max vel: 2.73 cm2
AV Area VTI: 2.67 cm2
AV Area mean vel: 2.39 cm2
AV Mean grad: 2 mmHg
AV Peak grad: 4.4 mmHg
Ao pk vel: 1.05 m/s
Area-P 1/2: 2.99 cm2
Calc EF: 53.8 %
S' Lateral: 3.1 cm
Single Plane A2C EF: 50.7 %
Single Plane A4C EF: 55.2 %

## 2021-12-29 ENCOUNTER — Ambulatory Visit (INDEPENDENT_AMBULATORY_CARE_PROVIDER_SITE_OTHER): Payer: Commercial Managed Care - HMO | Admitting: Licensed Clinical Social Worker

## 2021-12-29 DIAGNOSIS — F331 Major depressive disorder, recurrent, moderate: Secondary | ICD-10-CM

## 2021-12-29 DIAGNOSIS — F4310 Post-traumatic stress disorder, unspecified: Secondary | ICD-10-CM
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
## 2022-01-02 ENCOUNTER — Ambulatory Visit (HOSPITAL_COMMUNITY): Payer: Commercial Managed Care - HMO | Admitting: Licensed Clinical Social Worker

## 2022-01-19 IMAGING — MG MM DIGITAL SCREENING BILAT W/ TOMO AND CAD
8 series · 9 of 24 positions shown · non-contrast
Comparison: Previous exam(s).

CLINICAL DATA: Screening.

EXAM:
DIGITAL SCREENING BILATERAL MAMMOGRAM WITH TOMOSYNTHESIS AND CAD
TECHNIQUE: Bilateral screening digital craniocaudal and mediolateral oblique
mammograms were obtained. Bilateral screening digital breast
tomosynthesis was performed. The images were evaluated with
computer-aided detection.

[L CC synth-2D]
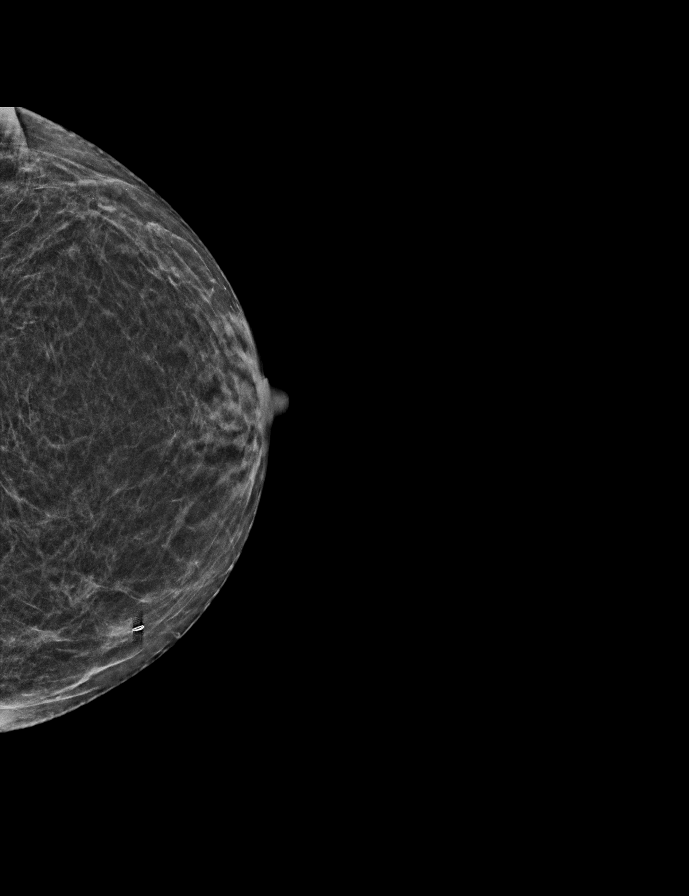

[R MLO synth-2D]
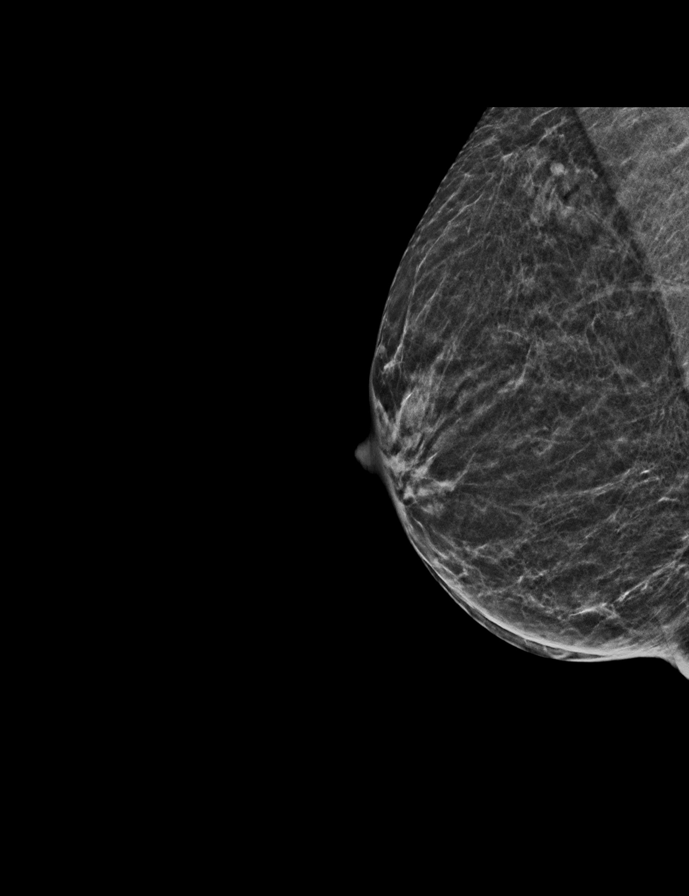

[L MLO synth-2D]
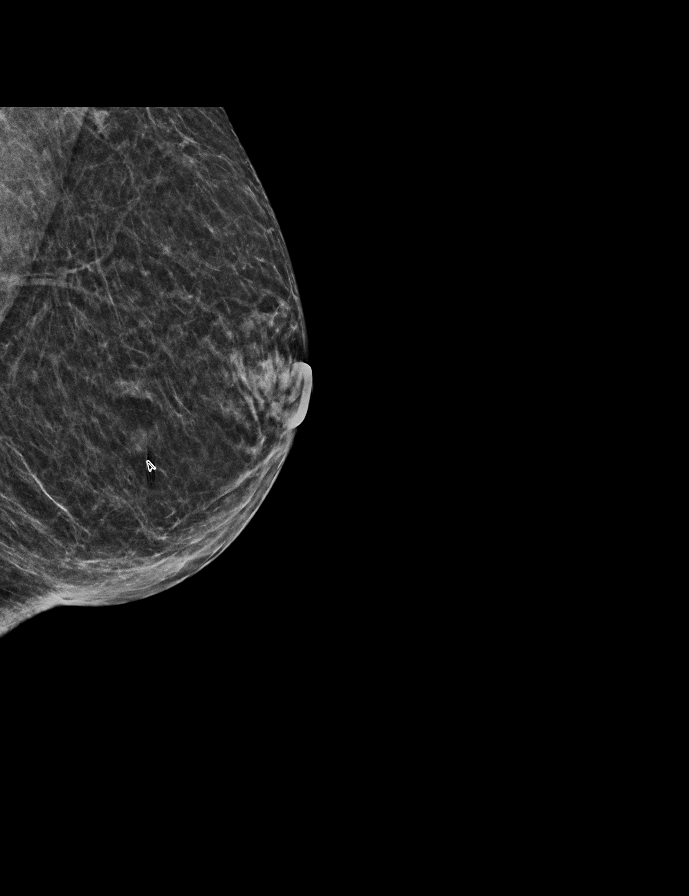

[R CC synth-2D]
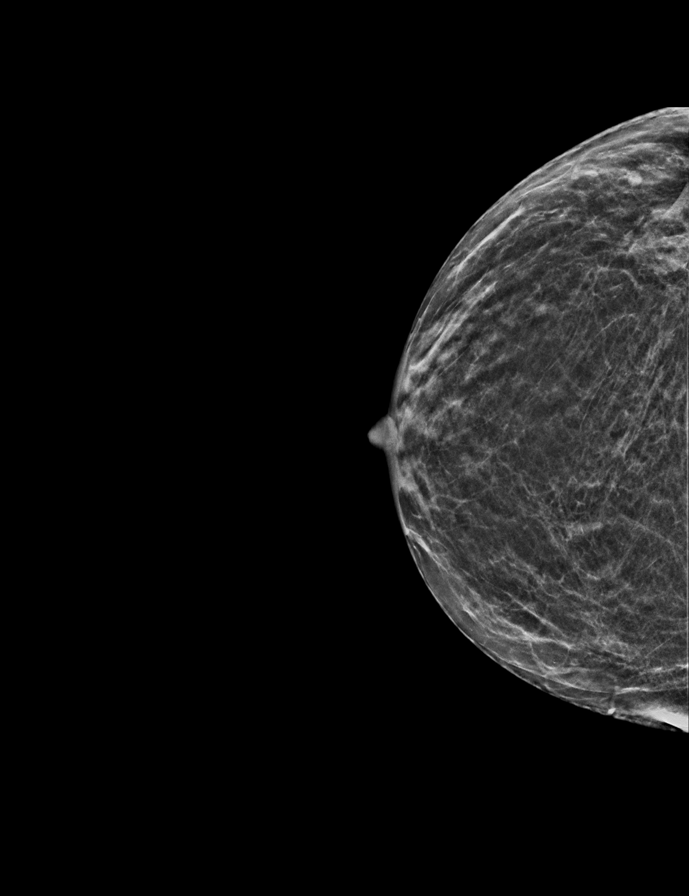

[R CC tomo · 2 of 39 frames shown]
[frame 13/39]
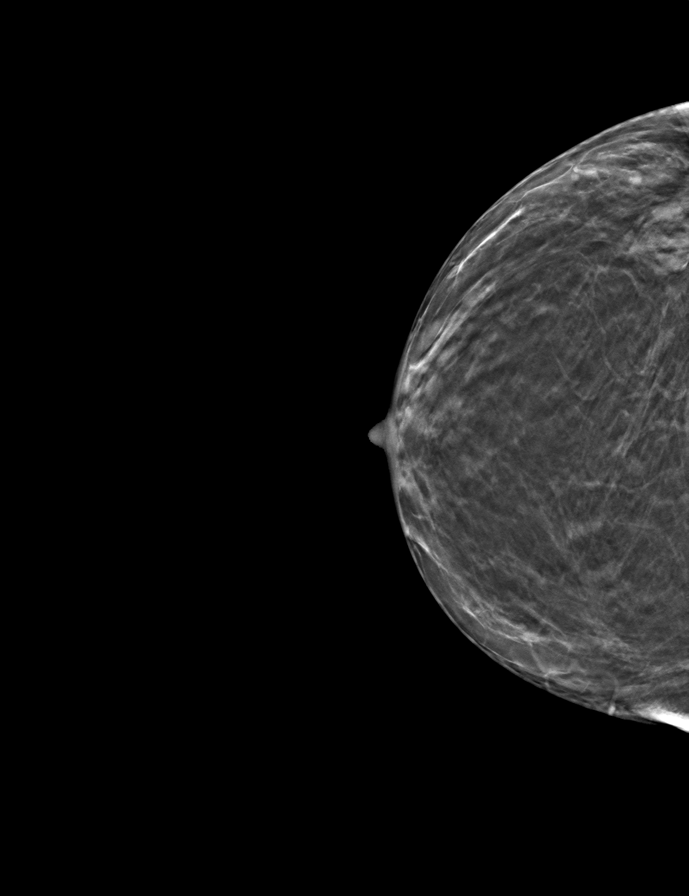
[frame 20/39]
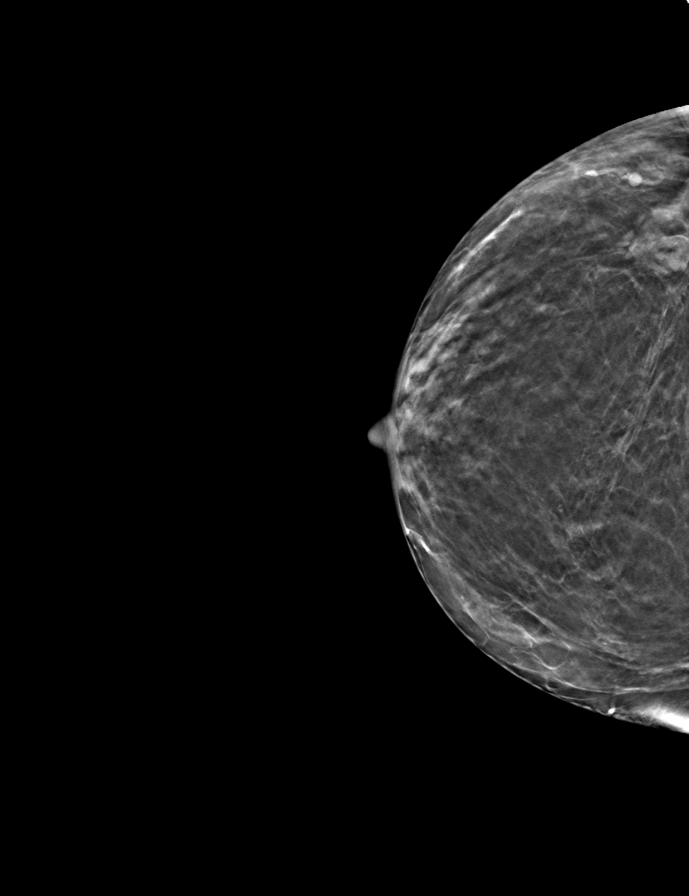

[L MLO tomo · tomo slice 20/39.0]
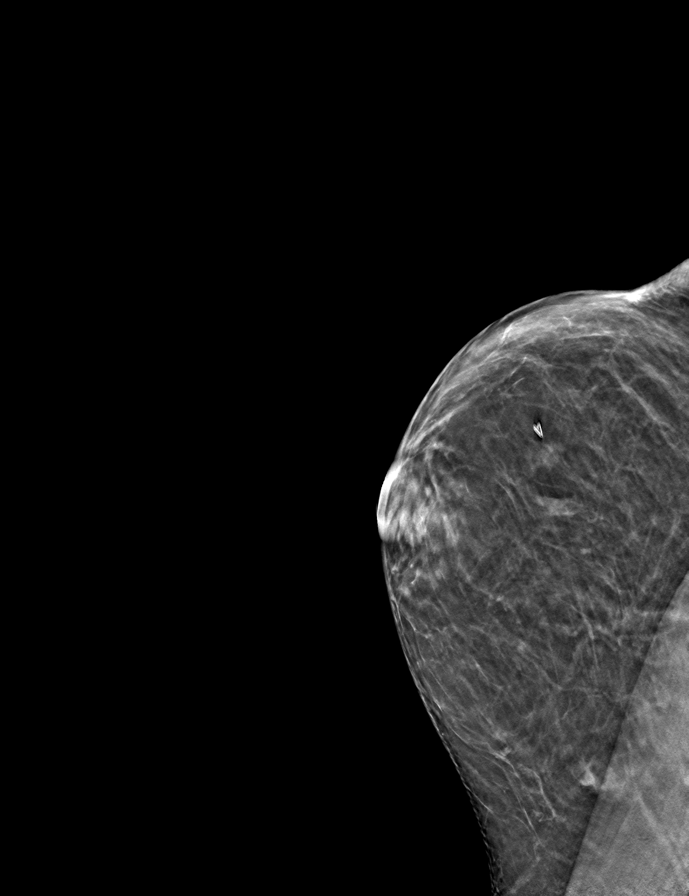

[L CC tomo · tomo slice 21/42.0]
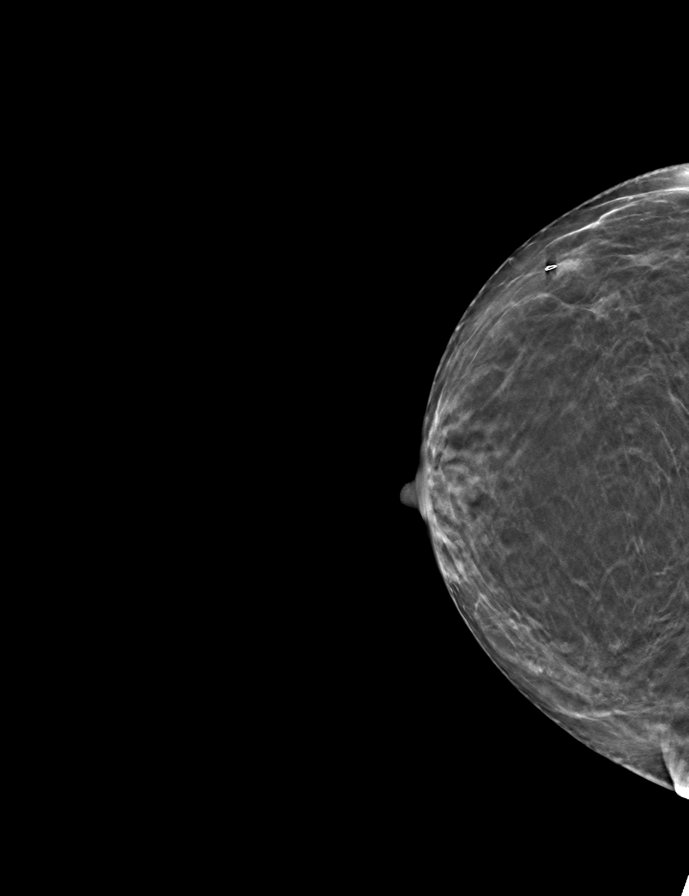

[R MLO tomo · tomo slice 19/37.0]
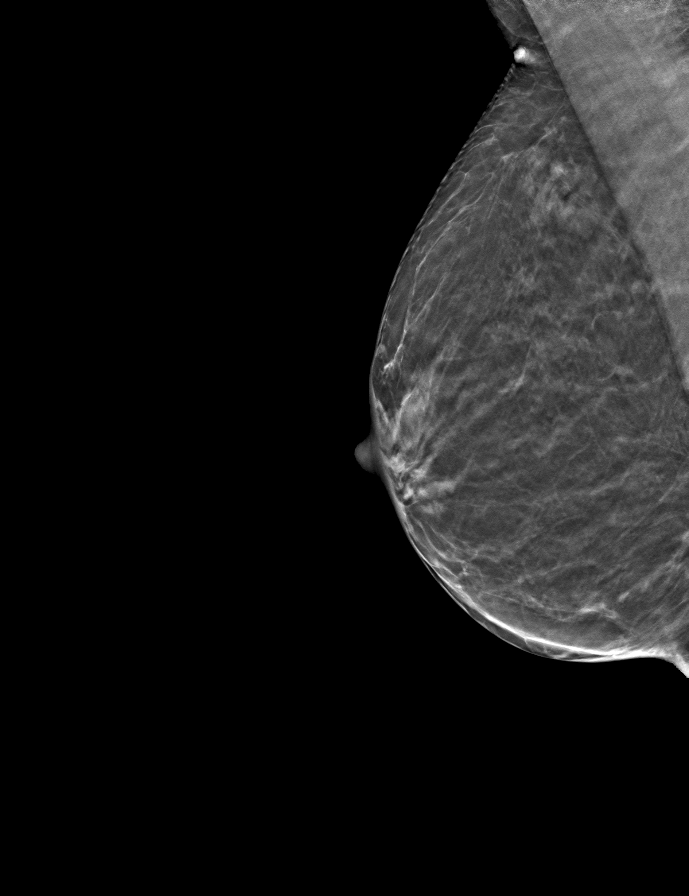

[9 of 24 positions shown; findings below may reference images not displayed]

ACR Breast Density Category b: There are scattered areas of
fibroglandular density.
FINDINGS: There are no findings suspicious for malignancy.
IMPRESSION: No mammographic evidence of malignancy. A result letter of this
screening mammogram will be mailed directly to the patient.

RECOMMENDATION:
Screening mammogram in one year. (Code:51-O-LD2)

BI-RADS CATEGORY  1: Negative.

## 2022-01-20 ENCOUNTER — Ambulatory Visit (INDEPENDENT_AMBULATORY_CARE_PROVIDER_SITE_OTHER): Payer: Commercial Managed Care - HMO | Admitting: Psychiatry

## 2022-01-20 ENCOUNTER — Other Ambulatory Visit: Payer: Self-pay

## 2022-01-20 ENCOUNTER — Encounter: Payer: Self-pay | Admitting: Psychiatry

## 2022-01-20 VITALS — BP 120/81 | HR 67 | Temp 97.9°F | Ht 62.0 in | Wt 154.4 lb

## 2022-01-20 DIAGNOSIS — F4310 Post-traumatic stress disorder, unspecified: Secondary | ICD-10-CM | POA: Diagnosis not present

## 2022-01-20 DIAGNOSIS — F331 Major depressive disorder, recurrent, moderate: Secondary | ICD-10-CM

## 2022-01-20 DIAGNOSIS — F1021 Alcohol dependence, in remission: Secondary | ICD-10-CM

## 2022-01-20 DIAGNOSIS — G2581 Restless legs syndrome: Secondary | ICD-10-CM

## 2022-01-20 DIAGNOSIS — F4010 Social phobia, unspecified: Secondary | ICD-10-CM | POA: Diagnosis not present

## 2022-01-20 MED ORDER — ZOLPIDEM TARTRATE 5 MG PO TABS
5.0000 mg | ORAL_TABLET | Freq: Every evening | ORAL | 1 refills | Status: DC | PRN
  Filled 2022-01-20: qty 30, 30d supply, fill #0
  Filled 2022-03-19: qty 30, 30d supply, fill #1

## 2022-01-20 MED ORDER — BREXPIPRAZOLE 0.25 MG PO TABS
0.2500 mg | ORAL_TABLET | Freq: Every day | ORAL | 1 refills | Status: DC
  Filled 2022-01-20 – 2022-01-24 (×5): qty 30, 30d supply, fill #0
  Filled 2022-02-20: qty 30, 30d supply, fill #1

## 2022-01-20 NOTE — Patient Instructions (Signed)
Brexpiprazole Tablets What is this medication? BREXPIPRAZOLE (brex PIP ray zole) treats schizophrenia. It may also be used with antidepressant medication to treat depression. It can also be used to treat agitation caused by Alzheimer disease. It works by balancing the levels of dopamine and serotonin in your brain, substances that help regulate mood, behaviors, and thoughts. It belongs to a group of medications called antipsychotics. Antipsychotic medications can be used to treat several kinds of mental health conditions. This medicine may be used for other purposes; ask your health care provider or pharmacist if you have questions. COMMON BRAND NAME(S): REXULTI What should I tell my care team before I take this medication? They need to know if you have any of these conditions: Dementia Diabetes Difficulty swallowing Have trouble controlling your muscles Have urges you are unable to control (for example, gambling, spending money, or eating) Heart disease High cholesterol History of breast cancer History of stroke Kidney disease Liver disease Low blood counts, like low white cell, platelet, or red cell counts Low blood pressure Parkinson's disease Seizures Suicidal thoughts, plans or attempt; a previous suicide attempt by you or a family member An unusual or allergic reaction to brexpiprazole, other medications, foods, dyes, or preservatives Pregnant or trying to get pregnant Breast-feeding How should I use this medication? Take this medication by mouth with water. Take it as directed on the prescription label at the same time every day. You can take it with or without food. If it upsets your stomach, take it with food. Keep taking this medication unless your care team tells you to stop. Stopping it too quickly can cause serious side effects. It can also make your condition worse. A special MedGuide will be given to you by the pharmacist with each prescription and refill. Be sure to read  this information carefully each time. Talk to your care team about the use of this medication in children. While it may be prescribed for children as young as 13 years for selected conditions, precautions do apply. Overdosage: If you think you have taken too much of this medicine contact a poison control center or emergency room at once. NOTE: This medicine is only for you. Do not share this medicine with others. What if I miss a dose? If you miss a dose, take it as soon as you can. If it is almost time for your next dose, take only that dose. Do not take double or extra doses. What may interact with this medication? Do not take this medication with any of the following: Aripiprazole Metoclopramide This medication may also interact with the following: Antihistamines for allergy, cough, and cold Certain medications for anxiety or sleep Certain medications for depression like amitriptyline, duloxetine, fluoxetine, paroxetine, sertraline Certain medications for fungal infections like fluconazole, itraconazole, ketoconazole Clarithromycin General anesthetics like halothane, isoflurane, methoxyflurane, propofol Levodopa or other medications for Parkinson's disease Medications for blood pressure Medications that relax muscles for surgery Medications for seizures Narcotic medications for pain Phenothiazines like chlorpromazine, prochlorperazine, thioridazine Quinidine Rifampin [facility] John's Wort This list may not describe all possible interactions. Give your health care provider a list of all the medicines, herbs, non-prescription drugs, or dietary supplements you use. Also tell them if you smoke, drink alcohol, or use illegal drugs. Some items may interact with your medicine. What should I watch for while using this medication? Visit your care team for regular checks on your progress. Tell your care team if symptoms do not start to get better or if they get worse.  Do not stop taking except on your  care team's advice. You may develop a severe reaction. Your care team will tell you how much medication to take. Patients and their families should watch out for new or worsening depression or thoughts of suicide. Also watch out for sudden changes in feelings such as feeling anxious, agitated, panicky, irritable, hostile, aggressive, impulsive, severely restless, overly excited and hyperactive, or not being able to sleep. If this happens, especially at the beginning of antidepressant treatment or after a change in dose, call your care team. You may get dizzy or drowsy. Do not drive, use machinery, or do anything that needs mental alertness until you know how this medication affects you. Do not stand or sit up quickly, especially if you are an older patient. This reduces the risk of dizzy or fainting spells. Alcohol may interfere with the effect of this medication. Avoid alcoholic drinks. There have been reports of increased sexual urges or other strong urges such as gambling while taking this medication. If you experience any of these while taking this medication, you should report this to your care team as soon as possible. This medication may cause dry eyes and blurred vision. If you wear contact lenses you may feel some discomfort. Lubricating drops may help. See your eye care team if the problem does not go away or is severe. This medication may increase blood sugar. Ask your care team if changes in diet or medications are needed if you have diabetes. This medication can cause problems with controlling your body temperature. It can lower the response of your body to cold temperatures. If possible, stay indoors during cold weather. If you must go outdoors, wear warm clothes. It can also lower the response of your body to heat. Do not overheat. Do not over-exercise. Stay out of the sun when possible. If you must be in the sun, wear cool clothing. Drink plenty of water. If you have trouble controlling your  body temperature, call your care team right away. What side effects may I notice from receiving this medication? Side effects that you should report to your care team as soon as possible: Allergic reactions--skin rash, itching, hives, swelling of the face, lips, tongue, or throat High blood sugar (hyperglycemia)--increased thirst or amount of urine, unusual weakness or fatigue, blurry vision High fever, stiff muscles, increased sweating, fast or irregular heartbeat, and confusion, which may be signs of neuroleptic malignant syndrome Infection--fever, chills, cough, sore throat Low blood pressure--dizziness, feeling faint or lightheaded, blurry vision Pain or trouble swallowing Seizures Stroke--sudden numbness or weakness of the face, arm, or leg, trouble speaking, confusion, trouble walking, loss of balance or coordination, dizziness, severe headache, change in vision Thoughts of suicide or self-harm, worsening mood, feelings of depression Uncontrolled and repetitive body movements, muscle stiffness or spasms, tremors or shaking, loss of balance or coordination, restlessness, shuffling walk, which may be signs of extrapyramidal symptoms (EPS) Urges to engage in impulsive behaviors such as gambling, binge eating, sexual activity, or shopping in ways that are unusual for you Side effects that usually do not require medical attention (report to your care team if they continue or are bothersome): Constipation Drowsiness Headache Weight gain This list may not describe all possible side effects. Call your doctor for medical advice about side effects. You may report side effects to FDA at 1-800-FDA-1088. Where should I keep my medication? Keep out of the reach of children and pets. Store at room temperature between 20 and 25 degrees C (68  and 77 degrees F). Get rid of any unused medication after the expiration date. To get rid of medications that are no longer needed or have expired: Take the  medication to a medication take-back program. Check with your pharmacy or law enforcement to find a location. If you cannot return the medication, check the label or package insert to see if the medication should be thrown out in the garbage or flushed down the toilet. If you are not sure, ask your care team. If it is safe to put it in the trash, take the medication out of the container. Mix the medication with cat litter, dirt, coffee grounds, or other unwanted substance. Seal the mixture in a bag or container. Put it in the trash. NOTE: This sheet is a summary. It may not cover all possible information. If you have questions about this medicine, talk to your doctor, pharmacist, or health care provider.  2023 Elsevier/Gold Standard (2013-08-15 00:00:00)

## 2022-01-20 NOTE — Progress Notes (Signed)
[place] [facility] MD OP Progress Note  01/20/2022 10:51 AM Dane Bloch  MRN:  [id number]  Chief Complaint:  Chief Complaint  Patient presents with   Follow-up   Anxiety   Depression   HPI: [name] is a 56 year old Caucasian female, married, unemployed, with history of PTSD, MDD, social anxiety disorder, RLS, alcohol use disorder in remission, chronic pain, lives in [place] was evaluated in office today.  Patient today reports although Abilify is beneficial for her mood she does not want to stay on medications that can cause weight gain.  She is worried Abilify and Lexapro contributing to weight gain.  She reports the more weight she gains more she gets depressed.  She would like to know if there are other options.  Patient continues to struggle with sadness, irritability, being overwhelmed easily.  Patient reports she was able to spend time with family during Christmas holiday.  She however reports whenever she went into a group situation she became anxious, broke out into hives and had to get out to get some fresh air and focus on her breath.  This happened a couple of times in the past few weeks.  She continues to follow-up with her therapist and is agreeable to having more frequent sessions.  Patient reports sleep is overall good.  Denies any concerns with the Ambien.  Patient denies any suicidality, homicidality or perceptual disturbances.  Patient denies any other concerns today.    Visit Diagnosis:    ICD-10-CM   1. PTSD (post-traumatic stress disorder)  F43.10 brexpiprazole (REXULTI) 0.25 MG TABS tablet    zolpidem (AMBIEN) 5 MG tablet    2. MDD (major depressive disorder), recurrent episode, moderate (HCC)  F33.1 brexpiprazole (REXULTI) 0.25 MG TABS tablet    3. Social anxiety disorder  F40.10     4. RLS (restless legs syndrome)  G25.81     5. Alcohol use disorder, severe, in sustained remission ([place])  F10.21       Past Psychiatric History: Reviewed past  psychiatric history from progress note on 04/04/2021.  Past trials of medications-Wellbutrin, Zoloft, Xanax, Cymbalta, Pristiq, BuSpar, Deplin, Valium, Viibryd, Trintellix, Lamictal.  Patient with neuropsychological testing completed-05/05/2021-Dr. Rodenbough-does not meet criteria for ADHD.  Past Medical History:  Past Medical History:  Diagnosis Date   Allergy    Anemia    Anxiety    Asthma    Depression    GERD (gastroesophageal reflux disease)    Heart murmur    from childhood   Hiatal hernia    Hypertension    Peptic ulcer    PTSD (post-traumatic stress disorder)    UTI (urinary tract infection)     Past Surgical History:  Procedure Laterality Date   ABDOMINAL HYSTERECTOMY     fibroids   BIOPSY  07/11/2018   Procedure: BIOPSY;  Surgeon: [name], MD;  Location: [facility];  Service: Endoscopy;;   COLONOSCOPY  2014   ESOPHAGOGASTRODUODENOSCOPY (EGD) WITH PROPOFOL N/A 07/11/2018   Procedure: ESOPHAGOGASTRODUODENOSCOPY (EGD) WITH PROPOFOL;  Surgeon: [name], MD;  Location: [facility];  Service: Endoscopy;  Laterality: N/A;   GASTRIC BYPASS  2007   HEMOSTASIS CLIP PLACEMENT  07/11/2018   Procedure: HEMOSTASIS CLIP PLACEMENT;  Surgeon: [name], MD;  Location: [facility];  Service: Endoscopy;;   LAPAROTOMY N/A 03/08/2018   Procedure: EXPLORATORY LAPAROTOMY, REPAIR AND PATCH CLOSURE OF PERFORATED ULCER;  Surgeon: Georganna Skeans, MD;  Location: [facility];  Service: General;  Laterality: N/A;   TUBAL LIGATION  Family Psychiatric History: Reviewed family psychiatric history from progress note on 04/04/2021.  Family History:  Family History  Problem Relation Age of Onset   Stroke Mother    Alzheimer's disease Mother    Hypertension Father    Cancer Father        Prostate   Alcohol abuse Sister    Depression Sister    Alcohol abuse Brother    Depression Brother    Diabetes Maternal Grandfather    Liver disease Maternal Grandfather    Cancer  Maternal Grandfather        Prostate   Diabetes Maternal Grandmother    Alzheimer's disease Maternal Grandmother    Diabetes Paternal Grandfather    Cancer Paternal Grandfather        Lung, Prostate   Diabetes Paternal Grandmother    Alzheimer's disease Paternal Grandmother    Colon cancer Neg Hx    Esophageal cancer Neg Hx    Pancreatic cancer Neg Hx    Stomach cancer Neg Hx    Colon polyps Neg Hx    Rectal cancer Neg Hx     Social History: Reviewed social history from progress note on 04/04/2021. Social History   Socioeconomic History   Marital status: Married    Spouse name: mark   Number of children: 0   Years of education: Not on file   Highest education level: High school graduate  Occupational History    Employer: [employer]    Comment: [employer]  Tobacco Use   Smoking status: Former    Types: Cigarettes    Quit date: 01/24/1987    Years since quitting: 35.0   Smokeless tobacco: Never  Vaping Use   Vaping Use: Former   Substances: Flavoring   Devices: has vaped with flavor but no nicotine. Been 8 years  Substance and Sexual Activity   Alcohol use: Not Currently    Alcohol/week: 35.0 standard drinks of alcohol    Types: 35 Cans of beer per week   Drug use: No   Sexual activity: Not Currently  Other Topics Concern   Not on file  Social History Narrative   Regular exercise--no (because of knees)   Social Determinants of Health   Financial Resource Strain: Not on file  Food Insecurity: Not on file  Transportation Needs: Not on file  Physical Activity: Not on file  Stress: Not on file  Social Connections: Not on file    Allergies:  Allergies  Allergen Reactions   Lamictal [Lamotrigine] Rash   Penicillins Other (See Comments)    Did it involve swelling of the face/tongue/throat, SOB, or low BP? Unk Did it involve sudden or severe rash/hives, skin peeling, or any reaction on the inside of your mouth or nose? Unk Did you need to seek medical  attention at a hospital or doctor's office? Unk When did it last happen? Was 56 years old; reaction not recalled     If all above answers are "NO", may proceed with cephalosporin use.    Codeine Itching    Crazy dreams and itching-pt said she can take it with food    Metabolic Disorder Labs: No results found for: "HGBA1C", "MPG" No results found for: "PROLACTIN" Lab Results  Component Value Date   CHOL 139 07/19/2021   TRIG 45.0 07/19/2021   HDL 59.80 07/19/2021   CHOLHDL 2 07/19/2021   VLDL 9.0 07/19/2021   LDLCALC 70 07/19/2021   LDLCALC 112 (H) 09/23/2020   Lab Results  Component Value Date  TSH 3.37 11/14/2021   TSH 3.83 07/19/2021    Therapeutic Level Labs: No results found for: "LITHIUM" No results found for: "VALPROATE" No results found for: "CBMZ"  Current Medications: Current Outpatient Medications  Medication Sig Dispense Refill   Acetaminophen (TYLENOL 8 HOUR ARTHRITIS PAIN PO) Take 2 tablets by mouth as needed.     brexpiprazole (REXULTI) 0.25 MG TABS tablet Take 1 tablet (0.25 mg total) by mouth daily. 30 tablet 1   busPIRone (BUSPAR) 15 MG tablet Take 1 tablet by mouth 2 (two) times daily. Take daily at 9 AM and at 4 PM 180 tablet 0   Cholecalciferol (VITAMIN D3) 50 MCG (2000 UT) capsule Take 1 capsule (2,000 Units total) by mouth daily. 100 capsule 0   colestipol (COLESTID) 1 g tablet Take 1 tablet (1 g total) by mouth 2 (two) times daily. Please schedule an office visit for further refills. Thank you 60 tablet 2   cyanocobalamin (VITAMIN B12) 1000 MCG/ML injection Inject 1 mL (1,000 mcg total) into the muscle every 30 (thirty) days. 10 mL 0   cyclobenzaprine (FLEXERIL) 10 MG tablet Take 1 tablet (10 mg total) by mouth 3 (three) times daily as needed. for muscle spams 30 tablet 0   Diclofenac Sodium 2 % SOLN Apply 1 pump twice daily as needed. 112 g 1   escitalopram (LEXAPRO) 20 MG tablet Take 1 tablet (20 mg total) by mouth daily. 90 tablet 0   estradiol  (ESTRACE) 0.1 MG/GM vaginal cream Apply a pea-sized amount of cream to the fingertip and wipe in the front part of the vagina twice weekly 42.5 g 0   estradiol (VIVELLE-DOT) 0.1 MG/24HR patch Place 1 patch (0.1 mg total) onto the skin 2 (two) times a week. 8 patch 5   fluticasone (FLONASE) 50 MCG/ACT nasal spray Place 2 sprays into both nostrils daily. 16 g 5   furosemide (LASIX) 40 MG tablet Take 1 tablet (40 mg total) by mouth daily. 90 tablet 1   Homeopathic Products (ALLERGY MEDICINE PO) Take 1 tablet by mouth as needed (Takes the Clortabs. Equate Brand).     HYDROcodone-acetaminophen (NORCO/VICODIN) 5-325 MG tablet Take 1 tablet by mouth every 6 (six) hours as needed. 15 tablet 0   hydrOXYzine (VISTARIL) 25 MG capsule Take 1 capsule (25 mg total) by mouth 2 (two) times daily as needed for anxiety. And sleep, racing thoughts 60 capsule 1   loperamide (IMODIUM) 2 MG capsule Take 2 mg by mouth daily as needed.      omeprazole (PRILOSEC) 40 MG capsule TAKE 1 CAPSULE BY MOUTH TWICE DAILY. OPEN CAPSULE AND SPRINKLE ON APPLESAUCE. 180 capsule 0   Probiotic Product (ALIGN) CHEW Chew 2 tablets by mouth daily. Align pre and probiotic with vitamain C-take one tablet each Align pre and probiotic with Vitamin b12 for immune and energy-take one tablet each     rOPINIRole (REQUIP) 1 MG tablet TAKE 1 TABLET (1 MG TOTAL) BY MOUTH AT BEDTIME. 90 tablet 1   SYRINGE-NEEDLE, DISP, 3 ML (B-D 3CC LUER-LOK SYR 25GX5/8") 25G X 5/8" 3 ML MISC USE AS DIRECTED WITH B-12 INJECTION 16 each 0   VITAMIN A PO Take by mouth.     zolpidem (AMBIEN) 5 MG tablet Take 1 tablet (5 mg total) by mouth at bedtime as needed for sleep. 30 tablet 1   No current facility-administered medications for this visit.     Musculoskeletal: Strength & Muscle Tone: within normal limits Gait & Station: normal Patient leans: N/A  Psychiatric Specialty Exam: Review of Systems  Musculoskeletal:  Positive for back pain.  Psychiatric/Behavioral:   Positive for dysphoric mood. The patient is nervous/anxious.   All other systems reviewed and are negative.   Blood pressure 120/81, pulse 67, temperature 97.9 F (36.6 C), temperature source Oral, height '5\' 2"'$  (1.575 m), weight 154 lb 6.4 oz (70 kg).Body mass index is 28.24 kg/m.  General Appearance: Casual  Eye Contact:  Fair  Speech:  Clear and Coherent  Volume:  Normal  Mood:  Anxious and Depressed  Affect:  Congruent  Thought Process:  Goal Directed and Descriptions of Associations: Intact  Orientation:  Full (Time, Place, and Person)  Thought Content: Logical   Suicidal Thoughts:  No  Homicidal Thoughts:  No  Memory:  Immediate;   Fair Recent;   Fair Remote;   Fair  Judgement:  Fair  Insight:  Fair  Psychomotor Activity:  Normal  Concentration:  Concentration: Fair and Attention Span: Fair  Recall:  [employer] of Knowledge: Fair  Language: Fair  Akathisia:  No  Handed:  Right  AIMS (if indicated): done  Assets:  Communication Skills Desire for Improvement Housing Social Support  ADL's:  Intact  Cognition: WNL  Sleep:  Fair   Screenings: [facility] Office Visit from 01/20/2022 in [facility] Office Visit from 11/17/2021 in [facility] Office Visit from 06/13/2021 in [facility] Visit from 05/25/2021 in [facility] Total Score 0 0 0 [place] Visit from 01/20/2022 in [facility] from 11/17/2021 in [facility] Visit from 10/19/2021 in [facility] from 09/15/2021 in [facility] from 04/11/2021 in [facility]  Total GAD-7 Score '19 20 19 19 15      '$ PHQ2-9    [facility] from 01/20/2022 in [facility] from 11/17/2021 in [facility] Visit from 10/25/2021 in [facility] at [facility] [place] Visit from 10/19/2021 in [facility] Office Visit from 09/15/2021 in [facility]  PHQ-2 Total Score 4 5 0 6 1  PHQ-9 Total Score 16 21 -- 23 12      [facility] Office Visit from 01/20/2022 in [facility] Office Visit from 11/17/2021 in [facility] Office Visit from 10/19/2021 in [facility] Low Risk Low Risk Low Risk        Assessment and Plan: Sumiye Hirth is a 56 year old Caucasian female with history of PTSD, MDD, social anxiety, alcohol use disorder in remission, chronic pain was evaluated in office today.  Patient with depression, anxiety symptoms concerns about weight gain side effects on medications like Abilify, discussed medication changes as noted below.  Plan PTSD-improving Continue CBT with Ms. Christina Hussami Lexapro 20 mg p.o. daily.  Patient concerned about weight gain side effects of Lexapro, will consider changing to another SSRI/SNRI in the future. BuSpar 15 mg p.o. twice daily-reduced dosage. Ambien 2.5-5 mg p.o. nightly as needed Reviewed [place] PMP AWARxE  MDD-unstable Patient with side effects to Abilify, weight gain-discussed to stop Abilify.  She could start taking Abilify 1 mg for 2 to 3 days and stop it. Start Rexulti 0.25 mg p.o. daily .  Could  start Rexulti once she stops the Abilify. Will not make any changes with Lexapro at this time. Hydroxyzine 25 mg p.o. twice daily as needed for severe anxiety Continue CBT with Ms. Christina Hussami-patient advised to have more frequent sessions.  Social anxiety disorder-improving Continue CBT Hydroxyzine as prescribed Lexapro 20 mg p.o. daily  Restless leg  syndrome-improving Requip 1 mg p.o. nightly  Alcohol use disorder in remission Sober since February 2022.  Discussed lifestyle modification, diet management, exercise for her weight gain.  Weight gain likely multifactorial including medications like Abilify.  Follow-up in clinic in 4 weeks or sooner if needed.   This note was generated in part or whole with voice recognition software. Voice recognition is usually quite accurate but there are transcription errors that can and very often do occur. I apologize for any typographical errors that were not detected and corrected.    Ursula Alert, MD 01/20/2022, 10:51 AM

## 2022-01-24 ENCOUNTER — Other Ambulatory Visit: Payer: Self-pay

## 2022-01-24 ENCOUNTER — Telehealth: Payer: Self-pay | Admitting: Psychiatry

## 2022-01-24 DIAGNOSIS — F4310 Post-traumatic stress disorder, unspecified: Secondary | ICD-10-CM

## 2022-01-24 DIAGNOSIS — F331 Major depressive disorder, recurrent, moderate: Secondary | ICD-10-CM

## 2022-01-24 MED ORDER — ARIPIPRAZOLE 2 MG PO TABS
2.0000 mg | ORAL_TABLET | Freq: Every day | ORAL | 0 refills | Status: DC
  Filled 2022-01-24: qty 30, 30d supply, fill #0

## 2022-01-24 NOTE — Telephone Encounter (Signed)
Pharmacy notified.

## 2022-01-24 NOTE — Telephone Encounter (Signed)
Patient called stating she was given a prescription card at last visit for [place]. Pharmacy could not fill stating the physician had to send in the request to the insurance company to get approval to fill.  She is also out of Abilify 2 mg and was taking a half since last conversation at last visit. Since she cannot get the Rexuliti yet, can the Abilify be refilled. Please advise.

## 2022-01-24 NOTE — Telephone Encounter (Signed)
Is she talking about PA request for Rexulti ? Janett Billow could you please check with pharmacist ? In the mean time I will send Abilify .

## 2022-01-24 NOTE — Telephone Encounter (Signed)
I have sent Abilify 30 days supply to pharmacy.  Pending Rexulti approval.

## 2022-01-24 NOTE — Telephone Encounter (Signed)
went online to [email or web address] and submitted the prior auth for the rexulti.- Prior Josem Kaufmann was approved case id # [id number] start date 01-23-22 end on 01-24-23

## 2022-01-24 NOTE — Telephone Encounter (Signed)
pt notified that rx was approved.

## 2022-01-25 ENCOUNTER — Other Ambulatory Visit: Payer: Self-pay

## 2022-01-30 ENCOUNTER — Encounter: Payer: Self-pay | Admitting: Physical Therapy

## 2022-01-30 ENCOUNTER — Ambulatory Visit: Payer: Commercial Managed Care - HMO | Attending: Urology | Admitting: Physical Therapy

## 2022-01-30 DIAGNOSIS — M542 Cervicalgia: Secondary | ICD-10-CM

## 2022-01-30 DIAGNOSIS — R2689 Other abnormalities of gait and mobility: Secondary | ICD-10-CM | POA: Diagnosis present

## 2022-01-30 DIAGNOSIS — M5459 Other low back pain: Secondary | ICD-10-CM | POA: Diagnosis present

## 2022-01-30 DIAGNOSIS — M533 Sacrococcygeal disorders, not elsewhere classified: Secondary | ICD-10-CM

## 2022-01-30 DIAGNOSIS — N393 Stress incontinence (female) (male): Secondary | ICD-10-CM | POA: Insufficient documentation

## 2022-01-30 NOTE — Therapy (Signed)
OUTPATIENT PHYSICAL THERAPY EVALUATION   Patient Name: Landen Breeland MRN: [id number] DOB:23-Nov-1965, 57 y.o., female Today's Date: 01/30/2022   PT End of Session - 01/30/22 1111     Visit Number 1    Number of Visits 10    Date for PT Re-Evaluation 04/10/22    PT Start Time 1110    PT Stop Time 1150    PT Time Calculation (min) 40 min             Past Medical History:  Diagnosis Date   Allergy    Anemia    Anxiety    Asthma    Depression    GERD (gastroesophageal reflux disease)    Heart murmur    from childhood   Hiatal hernia    Hypertension    Peptic ulcer    PTSD (post-traumatic stress disorder)    UTI (urinary tract infection)    Past Surgical History:  Procedure Laterality Date   ABDOMINAL HYSTERECTOMY     fibroids   BIOPSY  07/11/2018   Procedure: BIOPSY;  Surgeon: [name], MD;  Location: [place];  Service: Endoscopy;;   COLONOSCOPY  2014   ESOPHAGOGASTRODUODENOSCOPY (EGD) WITH PROPOFOL N/A 07/11/2018   Procedure: ESOPHAGOGASTRODUODENOSCOPY (EGD) WITH PROPOFOL;  Surgeon: [name], MD;  Location: [place];  Service: Endoscopy;  Laterality: N/A;   GASTRIC BYPASS  2007   HEMOSTASIS CLIP PLACEMENT  07/11/2018   Procedure: HEMOSTASIS CLIP PLACEMENT;  Surgeon: [name], MD;  Location: [place];  Service: Endoscopy;;   LAPAROTOMY N/A 03/08/2018   Procedure: EXPLORATORY LAPAROTOMY, REPAIR AND PATCH CLOSURE OF PERFORATED ULCER;  Surgeon: Georganna Skeans, MD;  Location[facility];  Service: General;  Laterality: N/A;   TUBAL LIGATION     Patient Active Problem List   Diagnosis Date Noted   At risk for prolonged QT interval syndrome 10/19/2021   PTSD (post-traumatic stress disorder) 04/04/2021   MDD (major depressive disorder), recurrent episode, moderate ([place]) 04/04/2021   Social anxiety disorder 04/04/2021   Dizziness 11/04/2020   Epistaxis 11/04/2020   Chest pain of uncertain etiology 65/03/5463   Anxiety 09/23/2020    Attention and concentration deficit 09/23/2020   Degenerative spondylolisthesis 04/05/2020   Low back pain with radiation 02/06/2020   Bilateral lower extremity edema 04/07/2019   Pain in both lower extremities 04/07/2019   Numbness in both hands 04/07/2019   Pain in right arm 04/07/2019   Recurrent urinary tract infection 10/24/2018   Menopausal syndrome 10/24/2018   Abnormal mammogram 10/24/2018   Abdominal pain, epigastric    Marginal ulcers    Melena 07/10/2018   Alcohol use disorder, severe, in sustained remission ([place]) 07/10/2018   Encounter for screening for HIV 07/10/2018   Asthma 07/10/2018   Exposure to COVID-19 virus 05/28/2018   GERD (gastroesophageal reflux disease) 05/28/2018   Free intraperitoneal air 03/08/2018   Perforated ulcer ([place]) 03/08/2018   Cervical radiculopathy at C7 02/22/2018   Essential hypertension 08/02/2017   Hematoma of hip, left, initial encounter 06/28/2017   Neck pain 06/28/2017   Nonallopathic lesion of cervical region 06/28/2017   Nonallopathic lesion of thoracic region 06/28/2017   Nonallopathic lesion of lumbar region 06/28/2017   Biomechanical lesion, unspecified 06/28/2017   Muscle spasm 04/20/2017   URI (upper respiratory infection) 10/02/2016   Preventative health care 05/01/2016   Degenerative arthritis of knee, bilateral 06/02/2015   Patellofemoral arthritis of left knee 05/04/2015   Cough 09/02/2013   Acute pharyngitis 09/02/2013   Sinusitis 06/25/2013  Dog bite of face 04/16/2013   Gastroenteritis 04/16/2013   Allergic rhinitis 04/16/2013   Mass of soft tissue 08/02/2012   RLS (restless legs syndrome) 01/21/2009   LYMPHOPENIA 03/23/2008   Depression 02/27/2008   HEMANGIOMA, SKIN 02/04/2008   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 02/19/2007    PCP: Lawson Radar, DO  REFERRING PROVIDER: Deniece Ree DIAG:  N39.3 (ICD-10-CM) - SUI (stress urinary incontinence, female)   Rationale for Evaluation and Treatment  Rehabilitation  THERAPY DIAG:  Other abnormalities of gait and mobility  Sacrococcygeal disorders, not elsewhere classified  Other low back pain  Coccyx pain  Neck pain  ONSET DATE:   02/05/20 woke up out of bed unable to walk and have balance after working one job with repetitive task   SUBJECTIVE:                                                                                                                                                                                           SUBJECTIVE STATEMENT: 1)  neck pain:  constant 4-5/10.  Every now and then 10/10 with looking up and then back down quickly. Pt used to work a Furniture conservator/restorer with looking down repeatedly. Pt also worked at [employer] which involved looking down and up. Tingling occurs down L UE last 3 fingers  occurs 20-30% of the time. It is rare.    2) radiating CLBP : constant dull pain. Sometime radiating down L LE across medial thigh and foot , sometimes radiates down B.  LIfting, bending , walking.    3) Urinary leakage occurs with sneezing and leakage before making it to the bathroom and with lifting   4) tailbone pain: Pt feels she sitting on tailbone. She feels like her tailbone is "pushing out of her throat" 4-5/10.  Numbness occurred and pain occurred after sitting for 10 min    PERTINENT HISTORY:  Gastric bypass, hysterectomy. Tubal ligation, PTSD , being filed on disability,   PAIN:  Are you having pain? See above PRECAUTIONS: None  WEIGHT BEARING RESTRICTIONS: No  FALLS:  Has patient fallen in last 6 months?Yes, fell since November 2023 . 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 Pt lost the Actor job and went full time at [employer].   PLOF: Independent  PATIENT GOALS:  "Give it my best that I can do"  Get back to walking 27K compared to currently 5K   OBJECTIVE:    [facility] PT Assessment - 01/30/22 1125       Observation/Other Assessments   Scoliosis R shoulder/ L iliac higher, L convex curve at thoracic      AROM   Overall AROM Comments cervical ext 50 deg, flexion 55deg, L rotation 30 deg     , R rotation  38 deg      Strength   Overall Strength Comments RLE 3-/5, L 3/5      Palpation   Spinal mobility L sideflexion / rotation caused the radiating pain down the LE ( post Tx: L sideflexion, no radiating pain)    Palpation comment tightness along L paraspinals, hypomobile at convex curve L thoracic segments      Ambulation/Gait   Gait Comments 0.9 m/s, decreased L LE with trunk lean on L, ( Post Tx: 1.06 m/s, trunk lean and decrease LLE still an issue             [facility] Adult PT Treatment/Exercise - 01/30/22 1125       Neuro Re-ed    Neuro Re-ed Details  cued for HEP to promote spinal and pelvic alignment      Manual Therapy   Manual therapy comments STM/MWM at problem areas noted in assessment to realign spine and pelvis               HOME EXERCISE PROGRAM: See pt instruction section    ASSESSMENT:  CLINICAL IMPRESSION:   Pt is a 57   yo  who presents with tailbone pain, CLBP, urge and stress incontinence, and neck pain  which impact QOL, ADL, fitness, and community activities.   Pt's musculoskeletal assessment revealed uneven pelvic girdle and shoulder height, asymmetries to gait pattern, limited spinal /pelvic mobility, dyscoordination and strength of pelvic floor mm, hip weakness, poor body mechanics which places strain on the abdominal/pelvic floor mm. These are deficits that indicate an ineffective intraabdominal pressure system associated  with increased risk for pt's Sx.    Pt was provided education on etiology of Sx with anatomy, physiology explanation with images along with the benefits of customized pelvic PT Tx based on pt's medical conditions and musculoskeletal deficits.  Explained the physiology of deep core mm coordination and roles of pelvic floor function in urination, defecation, sexual function, and postural control with deep core mm system.    Regional interdependent approaches will yield greater benefits in pt's POC due to the complexity of pt's medical Hx and the significant impact their Sx have had on their QOL. Pt would benefit from a biopsychosocial approach to yield optimal outcomes. Plan to build interdisciplinary team with pt's providers to optimize patient-centered care.                  Following Tx today which pt tolerated without complaints, pt demo'd no radiating pain with L trunk side flexion. Gait speed slightly improved but trunk lean still present to L. Plan                 Plan to assess leg length difference next session.  Pt benefits from skilled PT.    OBJECTIVE IMPAIRMENTS decreased activity tolerance, decreased coordination, decreased endurance, decreased mobility, difficulty walking, decreased ROM, decreased strength, decreased safety awareness, hypomobility,  increased muscle spasms, impaired flexibility, improper body mechanics, postural dysfunction, and pain. scar restrictions   ACTIVITY LIMITATIONS  self-care, home chores, work tasks    PARTICIPATION LIMITATIONS:  community   PERSONAL FACTORS   Hysterectomy, gastric bypass, past falls onto tailbone, uneven pelvic girdle alignment ( plan to assess leg length difference next session)   are also affecting patient's functional outcome.    REHAB POTENTIAL: Good   CLINICAL DECISION MAKING: Evolving/moderate complexity   EVALUATION COMPLEXITY: Moderate    PATIENT EDUCATION:    Education details: Showed pt anatomy images. Explained muscles  attachments/ connection, physiology of deep core system/ spinal- thoracic-pelvis-lower kinetic chain as they relate to pt's presentation, Sx, and past Hx. Explained what and how these areas of deficits need to be restored to balance and function    See Therapeutic activity / neuromuscular re-education section  Answered pt's questions.   Person educated: Patient Education method: Explanation, Demonstration, Tactile cues, Verbal cues, and Handouts Education comprehension: verbalized understanding, returned demonstration, verbal cues required, tactile cues required, and needs further education     PLAN: PT FREQUENCY: 1x/week   PT DURATION: 10 weeks   PLANNED INTERVENTIONS: Therapeutic exercises, Therapeutic activity, Neuromuscular re-education, Balance training, Gait training, Patient/Family education, Self Care, Joint mobilization, Spinal mobilization, Moist heat, Taping, and Manual therapy, dry needling.   PLAN FOR NEXT SESSION: See clinical impression for plan     GOALS: Goals reviewed with patient? Yes  SHORT TERM GOALS: Target date: 04/10/2022    Pt will demo IND with HEP                    Baseline: Not IND            Goal status: INITIAL   LONG TERM GOALS: Target date: 04/10/2022    1.Pt will demo proper deep core coordination without chest breathing and optimal excursion of diaphragm/pelvic floor in order to promote spinal stability and pelvic floor function  Baseline: dyscoordination Goal status: INITIAL  2.  Pt will demo > 5 pt change on FOTO  to improve QOL and function   PFDI Urinary baseline - 42   Lumber baseline  - 42 pts   Goal status: INITIAL  3.  Pt will demo proper body mechanics in against gravity tasks and ADLs  work tasks, fitness  to minimize straining pelvic floor / back                  Baseline: not IND, improper form that places strain on pelvic floor                Goal status: INITIAL    4. Pt will demo increased gait speed > 1.3 m/s  and increase steps > 15K  in order to ambulate safely in community and return to fitness routine  Baseline:  0.9 m/s decreased LLE with L trunk lean , 5 K  steps   Goal status: INITIAL    5. Pt will demo levelled pelvic girdle and shoulder height in order to progress to deep core strengthening HEP and restore mobility at spine, pelvis, gait, posture   Baseline:  Goal status: INITIAL   6. Pt will report being able to sit for > 30 min without tailbone pain and numbness in order to participate in social activities  Baseline: pain after 10 min with numbness Goal status: INITIAL  7. Pt will report being able to make it to the bathroom before leakage  after drinking 50-70 fl  oz  Baseline: leakage before getting to bathroom  Goal status: INITIAL   8. Pt will increase cervical ROM by 2-5 deg each direction to improve pain and turning to drive and look up and down to minimize falls  Baseline:  cervical ext 50 deg, flexion 55deg, L rotation 30 deg     , R rotation  38 deg  Goal Status: INITIAL      Jerl Mina, PT 01/30/2022, 12:48 PM

## 2022-01-30 NOTE — Patient Instructions (Signed)
Sitting posture, feet on floor 4 points of contact with sitting bones   __   Proper body mechanics with getting out of a chair to decrease strain  on back &pelvic floor   Avoid holding your breath when Getting out of the chair:  Scoot to front part of chair chair Heels behind knees, feet are hip width apart, nose over toes  Inhale like you are smelling roses Exhale to stand    __ 2 x day   Lengthen Back rib by L  shoulder ( winging)    Lie on R  side , pillow between knees and under head  Pull  L arm overhead over mattress, grab the edge of mattress,pull it upward, drawing elbow away from ears  Breathing 10 reps  Brushing arm with 3/4 turn onto pillow behind back  Lying on R  side ,Pillow/ Block between knees     dragging top forearm across ribs below breast rotating 3/4 turn,  rotating  _L_ only this week ,  relax onto the pillow behind the back  and then back to other palm , maintain top palm on body whole top and not lift shoulder 10 reps  ___

## 2022-02-06 ENCOUNTER — Ambulatory Visit: Payer: Commercial Managed Care - HMO | Admitting: Physical Therapy

## 2022-02-06 DIAGNOSIS — M542 Cervicalgia: Secondary | ICD-10-CM

## 2022-02-06 DIAGNOSIS — M533 Sacrococcygeal disorders, not elsewhere classified: Secondary | ICD-10-CM

## 2022-02-06 DIAGNOSIS — R2689 Other abnormalities of gait and mobility: Secondary | ICD-10-CM | POA: Diagnosis not present

## 2022-02-06 DIAGNOSIS — M5459 Other low back pain: Secondary | ICD-10-CM

## 2022-02-06 NOTE — Therapy (Signed)
OUTPATIENT PHYSICAL THERAPY TREATMENT    Patient Name: [name] MRN: [id number] DOB:1965/08/12, 57 y.o., female Today's Date: 02/06/2022   PT End of Session - 02/06/22 0951     Visit Number 2    Number of Visits 10    Date for PT Re-Evaluation 04/10/22    PT Start Time 0947    PT Stop Time 72    PT Time Calculation (min) 43 min    Activity Tolerance Patient tolerated treatment well    Behavior During Therapy [facility] for tasks assessed/performed             Past Medical History:  Diagnosis Date   Allergy    Anemia    Anxiety    Asthma    Depression    GERD (gastroesophageal reflux disease)    Heart murmur    from childhood   Hiatal hernia    Hypertension    Peptic ulcer    PTSD (post-traumatic stress disorder)    UTI (urinary tract infection)    Past Surgical History:  Procedure Laterality Date   ABDOMINAL HYSTERECTOMY     fibroids   BIOPSY  07/11/2018   Procedure: BIOPSY;  Surgeon: [name], MD;  Location: [place];  Service: Endoscopy;;   COLONOSCOPY  2014   ESOPHAGOGASTRODUODENOSCOPY (EGD) WITH PROPOFOL N/A 07/11/2018   Procedure: ESOPHAGOGASTRODUODENOSCOPY (EGD) WITH PROPOFOL;  Surgeon: [name], MD;  Location: [place];  Service: Endoscopy;  Laterality: N/A;   GASTRIC BYPASS  2007   HEMOSTASIS CLIP PLACEMENT  07/11/2018   Procedure: HEMOSTASIS CLIP PLACEMENT;  Surgeon: [name], MD;  Location: [place];  Service: Endoscopy;;   LAPAROTOMY N/A 03/08/2018   Procedure: EXPLORATORY LAPAROTOMY, REPAIR AND PATCH CLOSURE OF PERFORATED ULCER;  Surgeon: Georganna Skeans, MD;  Location: [place];  Service: General;  Laterality: N/A;   TUBAL LIGATION     Patient Active Problem List   Diagnosis Date Noted   At risk for prolonged QT interval syndrome 10/19/2021   PTSD (post-traumatic stress disorder) 04/04/2021   MDD (major depressive disorder), recurrent episode, moderate ([place]) 04/04/2021   Social anxiety disorder 04/04/2021    Dizziness 11/04/2020   Epistaxis 11/04/2020   Chest pain of uncertain etiology 43/15/4008   Anxiety 09/23/2020   Attention and concentration deficit 09/23/2020   Degenerative spondylolisthesis 04/05/2020   Low back pain with radiation 02/06/2020   Bilateral lower extremity edema 04/07/2019   Pain in both lower extremities 04/07/2019   Numbness in both hands 04/07/2019   Pain in right arm 04/07/2019   Recurrent urinary tract infection 10/24/2018   Menopausal syndrome 10/24/2018   Abnormal mammogram 10/24/2018   Abdominal pain, epigastric    Marginal ulcers    Melena 07/10/2018   Alcohol use disorder, severe, in sustained remission ([place]) 07/10/2018   Encounter for screening for HIV 07/10/2018   Asthma 07/10/2018   Exposure to COVID-19 virus 05/28/2018   GERD (gastroesophageal reflux disease) 05/28/2018   Free intraperitoneal air 03/08/2018   Perforated ulcer ([place]) 03/08/2018   Cervical radiculopathy at C7 02/22/2018   Essential hypertension 08/02/2017   Hematoma of hip, left, initial encounter 06/28/2017   Neck pain 06/28/2017   Nonallopathic lesion of cervical region 06/28/2017   Nonallopathic lesion of thoracic region 06/28/2017   Nonallopathic lesion of lumbar region 06/28/2017   Biomechanical lesion, unspecified 06/28/2017   Muscle spasm 04/20/2017   URI (upper respiratory infection) 10/02/2016   Preventative health care 05/01/2016   Degenerative arthritis of knee, bilateral 06/02/2015  Patellofemoral arthritis of left knee 05/04/2015   Cough 09/02/2013   Acute pharyngitis 09/02/2013   Sinusitis 06/25/2013   Dog bite of face 04/16/2013   Gastroenteritis 04/16/2013   Allergic rhinitis 04/16/2013   Mass of soft tissue 08/02/2012   RLS (restless legs syndrome) 01/21/2009   LYMPHOPENIA 03/23/2008   Depression 02/27/2008   HEMANGIOMA, SKIN 02/04/2008   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 02/19/2007    PCP: Lawson Radar, DO  REFERRING PROVIDER: Deniece Ree  DIAG:  N39.3 (ICD-10-CM) - SUI (stress urinary incontinence, female)   Rationale for Evaluation and Treatment Rehabilitation  THERAPY DIAG:  Other abnormalities of gait and mobility  Sacrococcygeal disorders, not elsewhere classified  Other low back pain  Coccyx pain  Neck pain  ONSET DATE:   02/05/20 woke up out of bed unable to walk and have balance after working one job with repetitive task   SUBJECTIVE:                                                                                                                                                                                           SUBJECTIVE STATEMENT TODAY: LBP relief she gained from last session lasted until early evening. 4-5/10    SUBJECTIVE STATEMENT EVAL  01/30/22: 1)  neck pain:  constant 4-5/10.  Every now and then 10/10 with looking up and then back down quickly. Pt used to work a Furniture conservator/restorer with looking down repeatedly. Pt also worked at [employer] which involved looking down and up. Tingling occurs down L UE last 3 fingers  occurs 20-30% of the time. It is rare.    2) radiating CLBP : constant dull pain. Sometime radiating down L LE across medial thigh and foot , sometimes radiates down B.  LIfting, bending , walking.    3) Urinary leakage occurs with sneezing and leakage before making it to the bathroom and with lifting   4) tailbone pain: Pt feels she sitting on tailbone. She feels like her tailbone is "pushing out of her throat" 4-5/10.  Numbness occurred and pain occurred after sitting for 10 min    PERTINENT HISTORY:  Gastric bypass, hysterectomy. Tubal ligation, PTSD , being filed on disability,   PAIN:  Are you having pain? See above PRECAUTIONS: None  WEIGHT BEARING RESTRICTIONS: No  FALLS:  Has patient fallen in last 6 months?Yes, fell since November 2023 . 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 Pt lost the Actor job and went full time at [employer].   PLOF: Independent  PATIENT GOALS:  "Give it my best that I can do"  Get back to walking 27K compared to currently 5K   OBJECTIVE:          HOME EXERCISE PROGRAM: See pt instruction section    ASSESSMENT:  CLINICAL IMPRESSION:  Pt showed good carry over with levelled pelvis girdle.                 Following Tx today which pt tolerated without complaints, pt demo'd  levelled shoulder height indicating improved spinal alignment.  Pt decreased pain with L sideflexion from 5-6/10 pain to 3-4/10 with radiating pain the buttock instead of the above the knee. Pt also showed decreased L posterior rotation of thorax after manual Tx. Plan to add deep core strengthening at next session.  .  Pt benefits from skilled PT.    OBJECTIVE IMPAIRMENTS decreased activity tolerance, decreased coordination, decreased endurance, decreased mobility, difficulty walking, decreased ROM, decreased strength, decreased safety awareness, hypomobility, increased muscle spasms, impaired flexibility, improper body mechanics, postural dysfunction, and pain. scar restrictions   ACTIVITY LIMITATIONS  self-care, home chores, work tasks    PARTICIPATION LIMITATIONS:  community   PERSONAL FACTORS   Hysterectomy, gastric bypass, past falls onto tailbone, uneven pelvic girdle alignment ( plan to assess leg length difference next session)   are also affecting patient's functional outcome.    REHAB POTENTIAL: Good   CLINICAL DECISION MAKING: Evolving/moderate complexity   EVALUATION COMPLEXITY: Moderate    PATIENT EDUCATION:    Education  details: Showed pt anatomy images. Explained muscles attachments/ connection, physiology of deep core system/ spinal- thoracic-pelvis-lower kinetic chain as they relate to pt's presentation, Sx, and past Hx. Explained what and how these areas of deficits need to be restored to balance and function    See Therapeutic activity / neuromuscular re-education section  Answered pt's questions.   Person educated: Patient Education method: Explanation, Demonstration, Tactile cues, Verbal cues, and Handouts Education comprehension: verbalized understanding, returned demonstration, verbal cues required, tactile cues required, and needs further education     PLAN: PT FREQUENCY: 1x/week   PT DURATION: 10 weeks   PLANNED INTERVENTIONS: Therapeutic exercises, Therapeutic activity, Neuromuscular re-education, Balance training, Gait training, Patient/Family education, Self Care, Joint mobilization, Spinal mobilization, Moist heat, Taping, and Manual therapy, dry needling.   PLAN FOR NEXT SESSION: See clinical impression for plan     GOALS: Goals reviewed with patient? Yes  SHORT TERM GOALS: Target date: 04/10/2022    Pt will demo IND with HEP                    Baseline: Not IND            Goal status: INITIAL   LONG TERM GOALS: Target date: 04/10/2022    1.Pt will demo proper deep core coordination without chest breathing and optimal excursion of diaphragm/pelvic floor in order to promote spinal stability and pelvic floor function  Baseline: dyscoordination Goal status: INITIAL  2.  Pt will demo > 5 pt change on FOTO  to improve QOL and function   PFDI Urinary baseline - 42   Lumber baseline  -  42 pts   Goal status: INITIAL  3.  Pt will demo proper body mechanics in against gravity tasks and ADLs  work tasks, fitness  to minimize straining pelvic floor / back                  Baseline: not IND, improper form that places strain on pelvic floor                Goal status:  INITIAL    4. Pt will demo increased gait speed > 1.3 m/s and increase steps > 15K  in order to ambulate safely in community and return to fitness routine  Baseline:  0.9 m/s decreased LLE with L trunk lean , 5 K  steps   Goal status: INITIAL    5. Pt will demo levelled pelvic girdle and shoulder height in order to progress to deep core strengthening HEP and restore mobility at spine, pelvis, gait, posture   Baseline:  Goal status: INITIAL   6. Pt will report being able to sit for > 30 min without tailbone pain and numbness in order to participate in social activities  Baseline: pain after 10 min with numbness Goal status: INITIAL  7. Pt will report being able to make it to the bathroom before leakage  after drinking 50-70 fl oz  Baseline: leakage before getting to bathroom  Goal status: INITIAL   8. Pt will increase cervical ROM by 2-5 deg each direction to improve pain and turning to drive and look up and down to minimize falls  Baseline:  cervical ext 50 deg, flexion 55deg, L rotation 30 deg     , R rotation  38 deg  Goal Status: INITIAL      Jerl Mina, PT 02/06/2022, 9:52 AM

## 2022-02-06 NOTE — Patient Instructions (Signed)
  Lengthen Back rib by L  shoulder ( winging)    Lie on R  side , pillow between knees and under head  Pull  L arm overhead over mattress, grab the edge of mattress,pull it upward, drawing elbow away from ears  Breathing 10 reps  Brushing arm with 3/4 turn onto pillow behind back  Lying on R  side ,Pillow/ Block between knees     dragging top forearm across ribs below breast rotating 3/4 turn,  rotating  _L_ only this week ,  relax onto the pillow behind the back  and then back to other palm , maintain top palm on body whole top and not lift shoulder  ___  Glenard Haring wings ( dragging bented elbow along bed)   20 reps

## 2022-02-10 ENCOUNTER — Ambulatory Visit (HOSPITAL_COMMUNITY): Payer: Commercial Managed Care - HMO | Admitting: Licensed Clinical Social Worker

## 2022-02-13 ENCOUNTER — Ambulatory Visit: Payer: Commercial Managed Care - HMO | Admitting: Physical Therapy

## 2022-02-20 ENCOUNTER — Other Ambulatory Visit: Payer: Self-pay | Admitting: Family Medicine

## 2022-02-20 ENCOUNTER — Ambulatory Visit: Payer: Commercial Managed Care - HMO | Admitting: Physical Therapy

## 2022-02-20 ENCOUNTER — Other Ambulatory Visit: Payer: Self-pay

## 2022-02-20 DIAGNOSIS — M79605 Pain in left leg: Secondary | ICD-10-CM

## 2022-02-20 DIAGNOSIS — M542 Cervicalgia: Secondary | ICD-10-CM

## 2022-02-20 DIAGNOSIS — M5459 Other low back pain: Secondary | ICD-10-CM

## 2022-02-20 DIAGNOSIS — M533 Sacrococcygeal disorders, not elsewhere classified: Secondary | ICD-10-CM

## 2022-02-20 DIAGNOSIS — R2689 Other abnormalities of gait and mobility: Secondary | ICD-10-CM | POA: Diagnosis not present

## 2022-02-20 MED ORDER — FUROSEMIDE 40 MG PO TABS
40.0000 mg | ORAL_TABLET | Freq: Every day | ORAL | 1 refills | Status: DC
  Filled 2022-02-20 (×2): qty 90, 90d supply, fill #0
  Filled 2022-05-22: qty 90, 90d supply, fill #1

## 2022-02-20 MED FILL — Ropinirole Hydrochloride Tab 1 MG: ORAL | 90 days supply | Qty: 90 | Fill #1 | Status: AC

## 2022-02-20 NOTE — Therapy (Signed)
OUTPATIENT PHYSICAL THERAPY TREATMENT    Patient Name: [name] MRN: [id number] DOB:05-14-1965, 57 y.o., female Today's Date: 02/20/2022   PT End of Session - 02/20/22 0300     Visit Number 3    Number of Visits 10    Date for PT Re-Evaluation 04/10/22    PT Start Time 1112    PT Stop Time 1150    PT Time Calculation (min) 38 min    Activity Tolerance Patient tolerated treatment well    Behavior During Therapy [facility] for tasks assessed/performed             Past Medical History:  Diagnosis Date   Allergy    Anemia    Anxiety    Asthma    Depression    GERD (gastroesophageal reflux disease)    Heart murmur    from childhood   Hiatal hernia    Hypertension    Peptic ulcer    PTSD (post-traumatic stress disorder)    UTI (urinary tract infection)    Past Surgical History:  Procedure Laterality Date   ABDOMINAL HYSTERECTOMY     fibroids   BIOPSY  07/11/2018   Procedure: BIOPSY;  Surgeon: [name], MD;  Location: [place];  Service: Endoscopy;;   COLONOSCOPY  2014   ESOPHAGOGASTRODUODENOSCOPY (EGD) WITH PROPOFOL N/A 07/11/2018   Procedure: ESOPHAGOGASTRODUODENOSCOPY (EGD) WITH PROPOFOL;  Surgeon: [name], MD;  Location: [place];  Service: Endoscopy;  Laterality: N/A;   GASTRIC BYPASS  2007   HEMOSTASIS CLIP PLACEMENT  07/11/2018   Procedure: HEMOSTASIS CLIP PLACEMENT;  Surgeon: [name], MD;  Location: [place];  Service: Endoscopy;;   LAPAROTOMY N/A 03/08/2018   Procedure: EXPLORATORY LAPAROTOMY, REPAIR AND PATCH CLOSURE OF PERFORATED ULCER;  Surgeon: Georganna Skeans, MD;  Location: [place];  Service: General;  Laterality: N/A;   TUBAL LIGATION     Patient Active Problem List   Diagnosis Date Noted   At risk for prolonged QT interval syndrome 10/19/2021   PTSD (post-traumatic stress disorder) 04/04/2021   MDD (major depressive disorder), recurrent episode, moderate ([place]) 04/04/2021   Social anxiety disorder 04/04/2021    Dizziness 11/04/2020   Epistaxis 11/04/2020   Chest pain of uncertain etiology 82/95/6213   Anxiety 09/23/2020   Attention and concentration deficit 09/23/2020   Degenerative spondylolisthesis 04/05/2020   Low back pain with radiation 02/06/2020   Bilateral lower extremity edema 04/07/2019   Pain in both lower extremities 04/07/2019   Numbness in both hands 04/07/2019   Pain in right arm 04/07/2019   Recurrent urinary tract infection 10/24/2018   Menopausal syndrome 10/24/2018   Abnormal mammogram 10/24/2018   Abdominal pain, epigastric    Marginal ulcers    Melena 07/10/2018   Alcohol use disorder, severe, in sustained remission ([place]) 07/10/2018   Encounter for screening for HIV 07/10/2018   Asthma 07/10/2018   Exposure to COVID-19 virus 05/28/2018   GERD (gastroesophageal reflux disease) 05/28/2018   Free intraperitoneal air 03/08/2018   Perforated ulcer ([place]) 03/08/2018   Cervical radiculopathy at C7 02/22/2018   Essential hypertension 08/02/2017   Hematoma of hip, left, initial encounter 06/28/2017   Neck pain 06/28/2017   Nonallopathic lesion of cervical region 06/28/2017   Nonallopathic lesion of thoracic region 06/28/2017   Nonallopathic lesion of lumbar region 06/28/2017   Biomechanical lesion, unspecified 06/28/2017   Muscle spasm 04/20/2017   URI (upper respiratory infection) 10/02/2016   Preventative health care 05/01/2016   Degenerative arthritis of knee, bilateral 06/02/2015  Patellofemoral arthritis of left knee 05/04/2015   Cough 09/02/2013   Acute pharyngitis 09/02/2013   Sinusitis 06/25/2013   Dog bite of face 04/16/2013   Gastroenteritis 04/16/2013   Allergic rhinitis 04/16/2013   Mass of soft tissue 08/02/2012   RLS (restless legs syndrome) 01/21/2009   LYMPHOPENIA 03/23/2008   Depression 02/27/2008   HEMANGIOMA, SKIN 02/04/2008   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 02/19/2007    PCP: Lawson Radar, DO  REFERRING PROVIDER: Deniece Ree  DIAG:  N39.3 (ICD-10-CM) - SUI (stress urinary incontinence, female)   Rationale for Evaluation and Treatment Rehabilitation  THERAPY DIAG:  Other abnormalities of gait and mobility  Sacrococcygeal disorders, not elsewhere classified  Other low back pain  Coccyx pain  Neck pain  ONSET DATE:   02/05/20 woke up out of bed unable to walk and have balance after working one job with repetitive task   SUBJECTIVE:                                                                                                                                                                                           SUBJECTIVE STATEMENT TODAY:  Pt had a fall last week and she landed on her hands and knees with scrapes and bruises which have healed well. Pt feels LBP on her L side > R and radiates to only the buttock    SUBJECTIVE STATEMENT EVAL  01/30/22: 1)  neck pain:  constant 4-5/10.  Every now and then 10/10 with looking up and then back down quickly. Pt used to work a Furniture conservator/restorer with looking down repeatedly. Pt also worked at [employer] which involved looking down and up. Tingling occurs down L UE last 3 fingers  occurs 20-30% of the time. It is rare.    2) radiating CLBP : constant dull pain. Sometime radiating down L LE across medial thigh and foot , sometimes radiates down B.  LIfting, bending , walking.    3) Urinary leakage occurs with sneezing and leakage before making it to the bathroom and with lifting   4) tailbone pain: Pt feels she sitting on tailbone. She feels like her tailbone is "pushing out of her throat" 4-5/10.  Numbness occurred and pain occurred after sitting for 10 min    PERTINENT HISTORY:  Gastric bypass, hysterectomy. Tubal ligation, PTSD , being filed on disability,   PAIN:  Are you having pain? See above PRECAUTIONS: None  WEIGHT BEARING RESTRICTIONS: No  FALLS:  Has patient fallen in last 6 months?Yes, fell since November 2023 . 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 Pt lost the Actor job and went full time at [employer].   PLOF: Independent  PATIENT GOALS:  "Give it my best that I can do"  Get back to walking 27K compared to currently 5K   OBJECTIVE:          HOME EXERCISE PROGRAM: See pt instruction section    ASSESSMENT:  CLINICAL IMPRESSION:  Pt showed good carry over with levelled pelvis girdle.                 Following Tx today which pt tolerated without complaints, pt demo'd  levelled shoulder height indicating improved spinal alignment.  Pt decreased pain with L sideflexion from 5-6/10 pain to 3-4/10 with radiating pain the buttock instead of the above the knee. Pt also showed decreased L posterior rotation of thorax after manual Tx. Plan to add deep core strengthening at next session.  .  Pt benefits from skilled PT.    OBJECTIVE IMPAIRMENTS decreased activity tolerance, decreased coordination, decreased endurance, decreased mobility, difficulty walking, decreased ROM, decreased strength, decreased safety awareness, hypomobility, increased muscle spasms, impaired flexibility, improper body mechanics, postural dysfunction, and pain. scar restrictions   ACTIVITY LIMITATIONS  self-care, home chores, work tasks    PARTICIPATION LIMITATIONS:  community   PERSONAL FACTORS   Hysterectomy, gastric bypass, past falls onto tailbone, uneven pelvic girdle alignment ( plan to assess leg length difference next session)   are also affecting patient's functional outcome.    REHAB POTENTIAL: Good   CLINICAL DECISION MAKING:  Evolving/moderate complexity   EVALUATION COMPLEXITY: Moderate    PATIENT EDUCATION:    Education details: Showed pt anatomy images. Explained muscles attachments/ connection, physiology of deep core system/ spinal- thoracic-pelvis-lower kinetic chain as they relate to pt's presentation, Sx, and past Hx. Explained what and how these areas of deficits need to be restored to balance and function    See Therapeutic activity / neuromuscular re-education section  Answered pt's questions.   Person educated: Patient Education method: Explanation, Demonstration, Tactile cues, Verbal cues, and Handouts Education comprehension: verbalized understanding, returned demonstration, verbal cues required, tactile cues required, and needs further education     PLAN: PT FREQUENCY: 1x/week   PT DURATION: 10 weeks   PLANNED INTERVENTIONS: Therapeutic exercises, Therapeutic activity, Neuromuscular re-education, Balance training, Gait training, Patient/Family education, Self Care, Joint mobilization, Spinal mobilization, Moist heat, Taping, and Manual therapy, dry needling.   PLAN FOR NEXT SESSION: See clinical impression for plan     GOALS: Goals reviewed with patient? Yes  SHORT TERM GOALS: Target date: 04/10/2022    Pt will demo IND with HEP                    Baseline: Not IND            Goal status: INITIAL   LONG TERM GOALS: Target date: 04/10/2022    1.Pt will demo proper deep core coordination without chest breathing and optimal excursion of diaphragm/pelvic floor in order to promote spinal stability and pelvic floor function  Baseline: dyscoordination Goal status: INITIAL  2.  Pt will demo > 5 pt change on FOTO  to improve QOL and function   PFDI Urinary baseline - 42   Lumber baseline  - 42 pts   Goal status: INITIAL  3.  Pt will demo proper body mechanics in against gravity tasks and ADLs  work tasks, fitness  to minimize straining pelvic floor / back                   Baseline: not IND, improper form that places strain on pelvic floor                Goal status: INITIAL    4. Pt will demo increased gait speed > 1.3 m/s and increase steps > 15K  in order to ambulate safely in community and return to fitness routine  Baseline:  0.9 m/s decreased LLE with L trunk lean , 5 K  steps   Goal status: INITIAL    5. Pt will demo levelled pelvic girdle and shoulder height in order to progress to deep core strengthening HEP and restore mobility at spine, pelvis, gait, posture   Baseline:  Goal status: INITIAL   6. Pt will report being able to sit for > 30 min without tailbone pain and numbness in order to participate in social activities  Baseline: pain after 10 min with numbness Goal status: INITIAL  7. Pt will report being able to make it to the bathroom before leakage  after drinking 50-70 fl oz  Baseline: leakage before getting to bathroom  Goal status: INITIAL   8. Pt will increase cervical ROM by 2-5 deg each direction to improve pain and turning to drive and look up and down to minimize falls  Baseline:  cervical ext 50 deg, flexion 55deg, L rotation 30 deg     , R rotation  38 deg  Goal Status: INITIAL      Jerl Mina, PT 02/20/2022, 11:17 AM

## 2022-02-21 ENCOUNTER — Other Ambulatory Visit: Payer: Self-pay

## 2022-02-27 ENCOUNTER — Ambulatory Visit: Payer: Commercial Managed Care - HMO | Admitting: Physical Therapy

## 2022-02-28 ENCOUNTER — Ambulatory Visit: Payer: Commercial Managed Care - HMO | Attending: Urology | Admitting: Physical Therapy

## 2022-02-28 DIAGNOSIS — R2689 Other abnormalities of gait and mobility: Secondary | ICD-10-CM

## 2022-02-28 DIAGNOSIS — M542 Cervicalgia: Secondary | ICD-10-CM | POA: Diagnosis present

## 2022-02-28 DIAGNOSIS — M533 Sacrococcygeal disorders, not elsewhere classified: Secondary | ICD-10-CM | POA: Diagnosis present

## 2022-02-28 DIAGNOSIS — M5459 Other low back pain: Secondary | ICD-10-CM | POA: Diagnosis present
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
## 2022-03-03 ENCOUNTER — Ambulatory Visit (INDEPENDENT_AMBULATORY_CARE_PROVIDER_SITE_OTHER): Payer: Commercial Managed Care - HMO | Admitting: Psychiatry

## 2022-03-03 ENCOUNTER — Encounter: Payer: Self-pay | Admitting: Psychiatry

## 2022-03-03 ENCOUNTER — Other Ambulatory Visit: Payer: Self-pay

## 2022-03-03 VITALS — BP 117/74 | HR 66 | Temp 98.7°F | Ht 62.0 in | Wt 162.2 lb

## 2022-03-03 DIAGNOSIS — F331 Major depressive disorder, recurrent, moderate: Secondary | ICD-10-CM | POA: Diagnosis not present

## 2022-03-03 DIAGNOSIS — G2581 Restless legs syndrome: Secondary | ICD-10-CM

## 2022-03-03 DIAGNOSIS — F4310 Post-traumatic stress disorder, unspecified: Secondary | ICD-10-CM | POA: Diagnosis not present

## 2022-03-03 DIAGNOSIS — F4010 Social phobia, unspecified: Secondary | ICD-10-CM | POA: Diagnosis not present

## 2022-03-03 DIAGNOSIS — F1021 Alcohol dependence, in remission: Secondary | ICD-10-CM

## 2022-03-03 MED ORDER — ESCITALOPRAM OXALATE 5 MG PO TABS
5.0000 mg | ORAL_TABLET | Freq: Every day | ORAL | 0 refills | Status: DC
  Filled 2022-03-03: qty 15, 15d supply, fill #0

## 2022-03-03 MED ORDER — SERTRALINE HCL 50 MG PO TABS
25.0000 mg | ORAL_TABLET | Freq: Every day | ORAL | 1 refills | Status: DC
  Filled 2022-03-03: qty 30, 30d supply, fill #0
  Filled 2022-03-27: qty 30, 30d supply, fill #1

## 2022-03-03 MED ORDER — BUSPIRONE HCL 15 MG PO TABS
15.0000 mg | ORAL_TABLET | Freq: Two times a day (BID) | ORAL | 0 refills | Status: DC
  Filled 2022-03-03: qty 180, 90d supply, fill #0

## 2022-03-03 NOTE — Progress Notes (Signed)
[facility] MD OP Progress Note  03/03/2022 12:54 PM [name]  MRN:  [id number]  Chief Complaint:  Chief Complaint  Patient presents with   Follow-up   Depression   Anxiety   Medication Refill   HPI: [name] is a 57 year old Caucasian female, married, unemployed, has a history of PTSD, MDD, social anxiety disorder, RLS, alcohol use disorder in remission, chronic pain, lives in [place] was evaluated in office today.  Patient today reports she tried physical therapy for her overactive bladder recently however she could not cope with some of the physical therapy exercises and had panic attacks there.  She however reports she is currently practicing those exercises at home by herself.  Her next appointment for PT is only towards the end of March.  Patient reports she has started taking the [place] which may have helped to some extent with her mood.  She however continues to struggle with lack of motivation, sadness, low energy,.  She reports she is also anxious about the fact that she is still on the Lexapro which is likely contributing to a lot of weight gain.  She reports she would like to go back on Zoloft which she used to take before and that did not cause her any side effects.  She reports the Zoloft at some point stopped working and she may have been taken off of it.  However she is interested in trial again.  Patient denies any suicidality at this time, denies any homicidality or perceptual disturbances.  Patient reports she continues to follow-up with her therapist, does have upcoming appointment.  Does have her disability evaluation coming up looks forward to that.  Denies any other concerns today.  Visit Diagnosis:    ICD-10-CM   1. PTSD (post-traumatic stress disorder)  F43.10 escitalopram (LEXAPRO) 5 MG tablet    sertraline (ZOLOFT) 50 MG tablet    busPIRone (BUSPAR) 15 MG tablet    2. MDD (major depressive disorder), recurrent episode, moderate (HCC)  F33.1  escitalopram (LEXAPRO) 5 MG tablet    sertraline (ZOLOFT) 50 MG tablet    busPIRone (BUSPAR) 15 MG tablet    3. Social anxiety disorder  F40.10 escitalopram (LEXAPRO) 5 MG tablet    4. RLS (restless legs syndrome)  G25.81     5. Alcohol use disorder, severe, in sustained remission ([place])  F10.21       Past Psychiatric History: Reviewed past psychiatric history from progress note on 04/04/2021.  Past trials of medications-Wellbutrin, Abilify, Zoloft, Xanax, Cymbalta, Pristiq, BuSpar, Deplin, Lexapro, Valium, Viibryd, Trintellix, Lamictal.  Patient with neuropsychological testing completed-05/05/2021-Dr. Rodenbough-does not meet criteria for ADHD.  Past Medical History:  Past Medical History:  Diagnosis Date   Allergy    Anemia    Anxiety    Asthma    Depression    GERD (gastroesophageal reflux disease)    Heart murmur    from childhood   Hiatal hernia    Hypertension    Peptic ulcer    PTSD (post-traumatic stress disorder)    UTI (urinary tract infection)     Past Surgical History:  Procedure Laterality Date   ABDOMINAL HYSTERECTOMY     fibroids   BIOPSY  07/11/2018   Procedure: BIOPSY;  Surgeon: [name], MD;  Location: [facility];  Service: Endoscopy;;   COLONOSCOPY  2014   ESOPHAGOGASTRODUODENOSCOPY (EGD) WITH PROPOFOL N/A 07/11/2018   Procedure: ESOPHAGOGASTRODUODENOSCOPY (EGD) WITH PROPOFOL;  Surgeon: [name], MD;  Location: [facility];  Service: Endoscopy;  Laterality: N/A;   GASTRIC BYPASS  2007   HEMOSTASIS CLIP PLACEMENT  07/11/2018   Procedure: HEMOSTASIS CLIP PLACEMENT;  Surgeon: [name], MD;  Location: [facility];  Service: Endoscopy;;   LAPAROTOMY N/A 03/08/2018   Procedure: EXPLORATORY LAPAROTOMY, REPAIR AND PATCH CLOSURE OF PERFORATED ULCER;  Surgeon: Georganna Skeans, MD;  Location: [facility];  Service: General;  Laterality: N/A;   TUBAL LIGATION      Family Psychiatric History: Reviewed family psychiatric history from progress note  on 04/04/2021.  Family History:  Family History  Problem Relation Age of Onset   Stroke Mother    Alzheimer's disease Mother    Hypertension Father    Cancer Father        Prostate   Alcohol abuse Sister    Depression Sister    Alcohol abuse Brother    Depression Brother    Diabetes Maternal Grandfather    Liver disease Maternal Grandfather    Cancer Maternal Grandfather        Prostate   Diabetes Maternal Grandmother    Alzheimer's disease Maternal Grandmother    Diabetes Paternal Grandfather    Cancer Paternal Grandfather        Lung, Prostate   Diabetes Paternal Grandmother    Alzheimer's disease Paternal Grandmother    Colon cancer Neg Hx    Esophageal cancer Neg Hx    Pancreatic cancer Neg Hx    Stomach cancer Neg Hx    Colon polyps Neg Hx    Rectal cancer Neg Hx     Social History: Reviewed social history from progress note on 04/04/2021. Social History   Socioeconomic History   Marital status: Married    Spouse name: mark   Number of children: 0   Years of education: Not on file   Highest education level: High school graduate  Occupational History    Employer: [employer]    Comment: [employer]  Tobacco Use   Smoking status: Former    Types: Cigarettes    Quit date: 01/24/1987    Years since quitting: 35.1   Smokeless tobacco: Never  Vaping Use   Vaping Use: Former   Substances: Flavoring   Devices: has vaped with flavor but no nicotine. Been 8 years  Substance and Sexual Activity   Alcohol use: Not Currently    Alcohol/week: 35.0 standard drinks of alcohol    Types: 35 Cans of beer per week   Drug use: No   Sexual activity: Not Currently  Other Topics Concern   Not on file  Social History Narrative   Regular exercise--no (because of knees)   Social Determinants of Health   Financial Resource Strain: Not on file  Food Insecurity: Not on file  Transportation Needs: Not on file  Physical Activity: Not on file  Stress: Not on file  Social  Connections: Not on file    Allergies:  Allergies  Allergen Reactions   Lamictal [Lamotrigine] Rash   Penicillins Other (See Comments)    Did it involve swelling of the face/tongue/throat, SOB, or low BP? Unk Did it involve sudden or severe rash/hives, skin peeling, or any reaction on the inside of your mouth or nose? Unk Did you need to seek medical attention at a hospital or doctor's office? Unk When did it last happen? Was 57 years old; reaction not recalled     If all above answers are "NO", may proceed with cephalosporin use.    Codeine Itching    Crazy dreams and itching-pt  said she can take it with food    Metabolic Disorder Labs: No results found for: "HGBA1C", "MPG" No results found for: "PROLACTIN" Lab Results  Component Value Date   CHOL 139 07/19/2021   TRIG 45.0 07/19/2021   HDL 59.80 07/19/2021   CHOLHDL 2 07/19/2021   VLDL 9.0 07/19/2021   LDLCALC 70 07/19/2021   LDLCALC 112 (H) 09/23/2020   Lab Results  Component Value Date   TSH 3.37 11/14/2021   TSH 3.83 07/19/2021    Therapeutic Level Labs: No results found for: "LITHIUM" No results found for: "VALPROATE" No results found for: "CBMZ"  Current Medications: Current Outpatient Medications  Medication Sig Dispense Refill   Acetaminophen (TYLENOL 8 HOUR ARTHRITIS PAIN PO) Take 2 tablets by mouth as needed.     brexpiprazole (REXULTI) 0.25 MG TABS tablet Take 1 tablet (0.25 mg total) by mouth daily. 30 tablet 1   busPIRone (BUSPAR) 15 MG tablet Take 1 tablet by mouth 2 (two) times daily. Take daily at 9 AM and at 4 PM 180 tablet 0   Cholecalciferol (VITAMIN D3) 50 MCG (2000 UT) capsule Take 1 capsule (2,000 Units total) by mouth daily. 100 capsule 0   colestipol (COLESTID) 1 g tablet Take 1 tablet (1 g total) by mouth 2 (two) times daily. Please schedule an office visit for further refills. Thank you 60 tablet 2   cyanocobalamin (VITAMIN B12) 1000 MCG/ML injection Inject 1 mL (1,000 mcg total) into the  muscle every 30 (thirty) days. 10 mL 0   cyclobenzaprine (FLEXERIL) 10 MG tablet Take 1 tablet (10 mg total) by mouth 3 (three) times daily as needed. for muscle spams 30 tablet 0   Diclofenac Sodium 2 % SOLN Apply 1 pump twice daily as needed. 112 g 1   escitalopram (LEXAPRO) 5 MG tablet Take 1 tablet (5 mg total) by mouth daily. Stop taking in 15 days 15 tablet 0   estradiol (ESTRACE) 0.1 MG/GM vaginal cream Apply a pea-sized amount of cream to the fingertip and wipe in the front part of the vagina twice weekly 42.5 g 0   estradiol (VIVELLE-DOT) 0.1 MG/24HR patch Place 1 patch (0.1 mg total) onto the skin 2 (two) times a week. 8 patch 5   fluticasone (FLONASE) 50 MCG/ACT nasal spray Place 2 sprays into both nostrils daily. 16 g 5   furosemide (LASIX) 40 MG tablet Take 1 tablet (40 mg total) by mouth daily. 90 tablet 1   Homeopathic Products (ALLERGY MEDICINE PO) Take 1 tablet by mouth as needed (Takes the Clortabs. Equate Brand).     HYDROcodone-acetaminophen (NORCO/VICODIN) 5-325 MG tablet Take 1 tablet by mouth every 6 (six) hours as needed. 15 tablet 0   hydrOXYzine (VISTARIL) 25 MG capsule Take 1 capsule (25 mg total) by mouth 2 (two) times daily as needed for anxiety. And sleep, racing thoughts 60 capsule 1   loperamide (IMODIUM) 2 MG capsule Take 2 mg by mouth daily as needed.      omeprazole (PRILOSEC) 40 MG capsule TAKE 1 CAPSULE BY MOUTH TWICE DAILY. OPEN CAPSULE AND SPRINKLE ON APPLESAUCE. 180 capsule 0   Probiotic Product (ALIGN) CHEW Chew 2 tablets by mouth daily. Align pre and probiotic with vitamain C-take one tablet each Align pre and probiotic with Vitamin b12 for immune and energy-take one tablet each     rOPINIRole (REQUIP) 1 MG tablet TAKE 1 TABLET (1 MG TOTAL) BY MOUTH AT BEDTIME. 90 tablet 1   sertraline (ZOLOFT) 50 MG tablet Take  0.5-1 tablets (25-50 mg total) by mouth daily with breakfast. Take 25 mg ( half tablet ) daily for 15 days and then increase to 50 mg ( 1 tablet)  after that. 30 tablet 1   SYRINGE-NEEDLE, DISP, 3 ML (B-D 3CC LUER-LOK SYR 25GX5/8") 25G X 5/8" 3 ML MISC USE AS DIRECTED WITH B-12 INJECTION 16 each 0   VITAMIN A PO Take by mouth.     zolpidem (AMBIEN) 5 MG tablet Take 1 tablet (5 mg total) by mouth at bedtime as needed for sleep. 30 tablet 1   No current facility-administered medications for this visit.     Musculoskeletal: Strength & Muscle Tone: within normal limits Gait & Station: normal Patient leans: N/A  Psychiatric Specialty Exam: Review of Systems  Psychiatric/Behavioral:  Positive for decreased concentration and dysphoric mood. The patient is nervous/anxious.   All other systems reviewed and are negative.   Blood pressure 117/74, pulse 66, temperature 98.7 F (37.1 C), temperature source Temporal, height 5' 2"$  (1.575 m), weight 162 lb 3.2 oz (73.6 kg), SpO2 98 %.Body mass index is 29.67 kg/m.  General Appearance: Casual  Eye Contact:  Fair  Speech:  Clear and Coherent  Volume:  Normal  Mood:  Anxious and Depressed  Affect:  Congruent  Thought Process:  Goal Directed and Descriptions of Associations: Intact  Orientation:  Full (Time, Place, and Person)  Thought Content: Logical   Suicidal Thoughts:  No  Homicidal Thoughts:  No  Memory:  Immediate;   Fair Recent;   Fair Remote;   Fair  Judgement:  Fair  Insight:  Fair  Psychomotor Activity:  Normal  Concentration:  Concentration: Fair and Attention Span: Fair  Recall:  [employer] of Knowledge: Fair  Language: Fair  Akathisia:  No  Handed:  Right  AIMS (if indicated): done  Assets:  Communication Skills Desire for Improvement Housing Social Support Transportation  ADL's:  Intact  Cognition: WNL  Sleep:  Fair   Screenings: [place] Visit from 03/03/2022 in [facility] Office Visit from 01/20/2022 in [facility] [place] [facility] Office Visit from 11/17/2021 in [facility] Office Visit from 06/13/2021 in [facility] Office Visit from 05/25/2021 in [facility] Total Score 0 0 0 0 0      [facility] Office Visit from 01/20/2022 in [facility] Office Visit from 11/17/2021 in [facility] Office Visit from 10/19/2021 in [facility] Office Visit from 09/15/2021 in [facility] Counselor from 04/11/2021 in [facility]  Total GAD-7 Score 19 20 19 19 15      $ PHQ2-9    [facility] Office Visit from 01/20/2022 in [facility] Office Visit from 11/17/2021 in [facility] Office Visit from 10/25/2021 in [facility] Visit from 10/19/2021 in [facility] Office Visit from 09/15/2021 in [facility]  PHQ-2 Total Score 4 5 0 6 1  PHQ-9 Total Score 16 21 -- 23 12      [facility] Office Visit from 03/03/2022 in [facility] Office Visit from 01/20/2022 in [facility] Visit from 11/17/2021 in [facility]  C-SSRS RISK CATEGORY Low Risk No Risk Low Risk        Assessment and Plan: [name] is a 57 year old Caucasian female with history of PTSD, MDD, social anxiety, alcohol use disorder in remission, chronic pain was evaluated in office today.  Patient is currently tolerating Rexulti well however continues to have mood symptoms, possible concerns about side effects to Lexapro, interested in medication  changes, discussed plan as noted below.  Plan PTSD-improving Continue CBT with Ms. Christina Hussami Taper off Lexapro.  Advised to take Lexapro 5 mg p.o. daily for 15 days and stop taking it BuSpar 15 mg p.o. twice daily-reduced dosage. Ambien 2.5-5 mg p.o. nightly as needed Reviewed [place] AWARxE  MDD-unstable Continue Rexulti 0.25 mg p.o. daily Start Zoloft 25 mg p.o. daily, increase to 50 mg p.o. daily after 15 days and when she stops the Lexapro 5 mg p.o. daily Hydroxyzine 25 mg p.o. twice daily as needed for severe anxiety Continue CBT with Ms. Christina Hussami  Social anxiety disorder-improving Continue CBT Start Zoloft as noted above. Hydroxyzine as prescribed  Restless leg syndrome-improving Requip 1 mg p.o. nightly  Alcohol use disorder in remission Sober since February 2022  Follow-up in clinic in 4 weeks or sooner if needed.  This note was generated in part or whole with voice recognition software. Voice recognition is usually quite accurate but there are transcription errors that can and very often do occur. I apologize for any typographical errors that were not detected and corrected.    Ursula Alert, MD 03/03/2022, 12:54 PM

## 2022-03-06 ENCOUNTER — Ambulatory Visit: Payer: Commercial Managed Care - HMO | Admitting: Physical Therapy

## 2022-03-06 ENCOUNTER — Ambulatory Visit (INDEPENDENT_AMBULATORY_CARE_PROVIDER_SITE_OTHER): Payer: Commercial Managed Care - HMO | Admitting: Licensed Clinical Social Worker

## 2022-03-06 DIAGNOSIS — F4310 Post-traumatic stress disorder, unspecified: Secondary | ICD-10-CM

## 2022-03-06 NOTE — Progress Notes (Signed)
Virtual Visit via Video Note  I connected with [name] on 03/06/22 at  9:00 AM EST by a video enabled telemedicine application and verified that I am speaking with the correct person using two identifiers.  Location: Patient: home Provider: remote office [place], [place])   I discussed the limitations of evaluation and management by telemedicine and the availability of in person appointments. The patient expressed understanding and agreed to proceed.   I discussed the assessment and treatment plan with the patient. The patient was provided an opportunity to ask questions and all were answered. The patient agreed with the plan and demonstrated an understanding of the instructions.   The patient was advised to call back or seek an in-person evaluation if the symptoms worsen or if the condition fails to improve as anticipated.  I provided 60 minutes of non-face-to-face time during this encounter.   Corynn Solberg R Fermon Ureta, LCSW   THERAPIST PROGRESS NOTE - Session Time: 9-10a Participation Level: Active  Behavioral Response: Neat and Well GroomedAlertDepressed  Type of Therapy: Individual Therapy  Treatment Goals addressed: Reduce the negative impact trauma related symptoms have on social, occupational, and family functioning per pt self report 3 out of 5 sessions documented.   : Reduction in intrusive event recollections, avoidance of event reminders, intense arousal, or disinterest in activities or relationships  as evidenced by pt self report 3 out of 5 sessions documente   ProgressTowards Goals: Progressing  Interventions: CBT and Other: trauma focused  Summary: [name] is a 57 y.o. female who presents with continuing symptoms related to PTSD and depression diagnoses.  Patient reports that she was recently taken off Lexapro and is now taking Zoloft.  Patient reports good effects on the Zoloft.  Patient reports that she is compliant with her medication and is getting fair  quality and quantity of sleep.  Allowed pt to explore and express thoughts and feelings associated with recent life situations and external stressors.  Patient states that financial concerns continue to be a major stressor for her.  Patient states that she is not able to get out and do things with other people, and this is triggering and making her feel sad.  Patient feels that her family members are making plans to go and do things and are not including patient, which triggered feelings of abandonment.  Patient states that the husband of one of her close friends has recently passed away, and patient has to attend the funeral.  Patient reports that thoughts about attending the funeral are triggering loss and previous grief related feelings, including fear.  Discussed relationship with father--patient feels that her father is dismissive towards her thoughts and feelings at times, which triggers patient's feelings that he has done this throughout her entire life.  Encouraged patient to focus on the present and to recognize that she is no longer that small, traumatized child.  Patient states that she often does favors for friends and family members, and feels that other people take advantage of her at times.  Patient does report that her husband has stepped up and is helping her dog sit for friend.  Patient states that she has a disability hearing on February 24 and their physician will be seeing her.  Patient reports that she has some anxiety about this meeting, but is overall looking forward to getting past it.  Discussed weight related concerns--patient states that she has recently gained 40 pounds.  Allow patient to explore eating behaviors and activity behaviors, and allowed patient to  identify behaviors that she could change or manipulate.  Patient reports that she has been doing pelvic therapy to help strengthen muscles in the pelvic area.  Patient reports that the physical therapist is very close,  and patient feels very uncomfortable with some of the physical touching, and has identified this as a PTSD trigger.  Encouraged patient to explore this with the therapist versus not going back to therapy.    Patient reports that she is engaging in positive social behaviors including spending time with friends, talking to friends on the phone, making signs for friends, and working on projects with her father.  Listened reflectively and provided patient with constructive input and support.  Allowed patient to ask questions as needed.  Continued recommendations are as follows: self care behaviors, positive social engagements, focusing on overall work/home/life balance, and focusing on positive physical and emotional wellness.   Suicidal/Homicidal: No.   Therapist Response: Pt is continuing to apply interventions learned in session into daily life situations. Pt is currently on track to meet goals utilizing interventions mentioned above. Personal growth and progress noted. Treatment to continue as indicated.   Pt continues to make good progress with self understanding, and self insight. Pt continues in process of reworking and reduction of traumatic pain and stress.   Plan: Return again in 4 weeks.  Diagnosis:  Encounter Diagnosis  Name Primary?   PTSD (post-traumatic stress disorder) Yes   Collaboration of Care: Other pt encouraged to continue care with psychiatrist of record, Dr. Ursula Alert  Patient/Guardian was advised Release of Information must be obtained prior to any record release in order to collaborate their care with an outside provider. Patient/Guardian was advised if they have not already done so to contact the registration department to sign all necessary forms in order for [place] to release information regarding their care.   Consent: Patient/Guardian gives verbal consent for treatment and assignment of benefits for services provided during this visit. Patient/Guardian expressed  understanding and agreed to proceed.   [place], LCSW 03/06/2022

## 2022-03-09 NOTE — Progress Notes (Signed)
03/10/2022 11:16 AM   [name] 1965/03/27 [id number]  Referring provider: Carollee Herter, Alferd Apa, DO [address] [place],  [place] [address]  Urological history: 1. rUTI's -contributing factors of vaginal atrophy, IBS and incomplete bladder emptying -RUS, 03/2021 small left renal cyst  -documented positive urine cultures over the last year  MDRO E.coli 03/10/2021  E.coli 01/31/2021 -managed with topical estrogen cream, pro-biotics, D-mannose and cranberry tablets    Chief Complaint  Patient presents with   Other    HPI: [name] is a 57 y.o. female who presents today for a 6 month follow up.  At her visit 6 months ago, she was experiencing 1-7 daytime urinations, 1-2 nighttime urinations and no urgency.  She is experiencing stress incontinence leaking 1-2 times weekly.  She is not wearing any absorbent products for her leakage.  She does engage in toilet mapping.  At her visit on 12/08/2021, she is having 1-7 daytime voids, 1-2 episodes of nocturia and no urge to urinate.  She has incontinence with both stress and urge.  She leaks urine 1-2 times weekly and will wear panty liners on occasion.  She does engage in toilet mapping.  She has been having a disturbing of currents of passage of air from her vaginal introitus after she has been sitting and rising to a standing position and sometimes while walking.  She denies any vaginal bulge or vaginal discharge.  She denies any pneumaturia or fecal material coming from her vagina or bladder.  PVR 82 mL.  Today, she is experiencing 1-7 daytime voids, nocturia x 1-2 and stress and urge incontinence.  She is leaking 3 or more times weekly.  She is wearing panty liners for incontinence protection.  She does engage in toilet mapping.  Patient denies any modifying or aggravating factors.  Patient denies any gross hematuria, dysuria or suprapubic/flank pain.  Patient denies any fevers, chills, nausea or vomiting.     PVR 84 mL  She is having a difficult time with pelvic floor physical therapy secondary to her issues with PTSD.  It may take her longer to complete the therapy.   PMH: Past Medical History:  Diagnosis Date   Allergy    Anemia    Anxiety    Asthma    Depression    GERD (gastroesophageal reflux disease)    Heart murmur    from childhood   Hiatal hernia    Hypertension    Peptic ulcer    PTSD (post-traumatic stress disorder)    UTI (urinary tract infection)     Surgical History: Past Surgical History:  Procedure Laterality Date   ABDOMINAL HYSTERECTOMY     fibroids   BIOPSY  07/11/2018   Procedure: BIOPSY;  Surgeon: [name], MD;  Location: [facility];  Service: Endoscopy;;   COLONOSCOPY  2014   ESOPHAGOGASTRODUODENOSCOPY (EGD) WITH PROPOFOL N/A 07/11/2018   Procedure: ESOPHAGOGASTRODUODENOSCOPY (EGD) WITH PROPOFOL;  Surgeon: [name], MD;  Location: [facility];  Service: Endoscopy;  Laterality: N/A;   GASTRIC BYPASS  2007   HEMOSTASIS CLIP PLACEMENT  07/11/2018   Procedure: HEMOSTASIS CLIP PLACEMENT;  Surgeon: [name], MD;  Location: [facility];  Service: Endoscopy;;   LAPAROTOMY N/A 03/08/2018   Procedure: EXPLORATORY LAPAROTOMY, REPAIR AND PATCH CLOSURE OF PERFORATED ULCER;  Surgeon: Georganna Skeans, MD;  Location: [facility];  Service: General;  Laterality: N/A;   TUBAL LIGATION      Home Medications:  Allergies as of  03/10/2022       Reactions   Lamictal [lamotrigine] Rash   Penicillins Other (See Comments)   Did it involve swelling of the face/tongue/throat, SOB, or low BP? Unk Did it involve sudden or severe rash/hives, skin peeling, or any reaction on the inside of your mouth or nose? Unk Did you need to seek medical attention at a hospital or doctor's office? Unk When did it last happen? Was 57 years old; reaction not recalled     If all above answers are "NO", may proceed with cephalosporin use.   Codeine Itching   Crazy dreams and  itching-pt said she can take it with food        Medication List        Accurate as of March 10, 2022 11:16 AM. If you have any questions, ask your nurse or doctor.          Align Google 2 tablets by mouth daily. Align pre and probiotic with vitamain C-take one tablet each Align pre and probiotic with Vitamin b12 for immune and energy-take one tablet each   ALLERGY MEDICINE PO Take 1 tablet by mouth as needed (Takes the Clortabs. Equate Brand).   B-D 3CC LUER-LOK SYR 25GX5/8" 25G X 5/8" 3 ML Misc Generic drug: SYRINGE-NEEDLE (DISP) 3 ML USE AS DIRECTED WITH B-12 INJECTION   busPIRone 15 MG tablet Commonly known as: BUSPAR Take 1 tablet by mouth 2 (two) times daily. Take daily at 9 AM and at 4 PM   colestipol 1 g tablet Commonly known as: COLESTID Take 1 tablet (1 g total) by mouth 2 (two) times daily. Please schedule an office visit for further refills. Thank you   cyanocobalamin 1000 MCG/ML injection Commonly known as: VITAMIN B12 Inject 1 mL (1,000 mcg total) into the muscle every 30 (thirty) days.   cyclobenzaprine 10 MG tablet Commonly known as: FLEXERIL Take 1 tablet (10 mg total) by mouth 3 (three) times daily as needed. for muscle spams   Diclofenac Sodium 2 % Soln Apply 1 pump twice daily as needed.   escitalopram 5 MG tablet Commonly known as: Lexapro Take 1 tablet (5 mg total) by mouth daily. Stop taking in 15 days   estradiol 0.1 MG/24HR patch Commonly known as: Vivelle-Dot Place 1 patch (0.1 mg total) onto the skin 2 (two) times a week.   estradiol 0.1 MG/GM vaginal cream Commonly known as: ESTRACE Apply a pea-sized amount of cream to the fingertip and wipe in the front part of the vagina twice weekly   fluticasone 50 MCG/ACT nasal spray Commonly known as: FLONASE Place 2 sprays into both nostrils daily.   furosemide 40 MG tablet Commonly known as: LASIX Take 1 tablet (40 mg total) by mouth daily.   HYDROcodone-acetaminophen 5-325 MG  tablet Commonly known as: NORCO/VICODIN Take 1 tablet by mouth every 6 (six) hours as needed.   hydrOXYzine 25 MG capsule Commonly known as: Vistaril Take 1 capsule (25 mg total) by mouth 2 (two) times daily as needed for anxiety. And sleep, racing thoughts (Take 1 capsule (25 mg total) by mouth 2 (two) times daily as needed for anxiety. And sleep, racing thoughts)   loperamide 2 MG capsule Commonly known as: IMODIUM Take 2 mg by mouth daily as needed.   omeprazole 40 MG capsule Commonly known as: PRILOSEC TAKE 1 CAPSULE BY MOUTH TWICE DAILY. OPEN CAPSULE AND SPRINKLE ON APPLESAUCE.   Rexulti 0.25 MG Tabs tablet Generic drug: brexpiprazole Take 1 tablet (0.25 mg total)  by mouth daily.   rOPINIRole 1 MG tablet Commonly known as: REQUIP TAKE 1 TABLET (1 MG TOTAL) BY MOUTH AT BEDTIME.   sertraline 50 MG tablet Commonly known as: Zoloft Take 0.5-1 tablets (25-50 mg total) by mouth daily with breakfast. Take 25 mg ( half tablet ) daily for 15 days and then increase to 50 mg ( 1 tablet) after that.   SM Vitamin D3 50 MCG (2000 UT) Caps Generic drug: Cholecalciferol Take 1 capsule (2,000 Units total) by mouth daily.   TYLENOL 8 HOUR ARTHRITIS PAIN PO Take 2 tablets by mouth as needed.   VITAMIN A PO Take by mouth.   zolpidem 5 MG tablet Commonly known as: AMBIEN Take 1 tablet (5 mg total) by mouth at bedtime as needed for sleep.        Allergies:  Allergies  Allergen Reactions   Lamictal [Lamotrigine] Rash   Penicillins Other (See Comments)    Did it involve swelling of the face/tongue/throat, SOB, or low BP? Unk Did it involve sudden or severe rash/hives, skin peeling, or any reaction on the inside of your mouth or nose? Unk Did you need to seek medical attention at a hospital or doctor's office? Unk When did it last happen? Was 57 years old; reaction not recalled     If all above answers are "NO", may proceed with cephalosporin use.    Codeine Itching    Crazy  dreams and itching-pt said she can take it with food    Family History: Family History  Problem Relation Age of Onset   Stroke Mother    Alzheimer's disease Mother    Hypertension Father    Cancer Father        Prostate   Alcohol abuse Sister    Depression Sister    Alcohol abuse Brother    Depression Brother    Diabetes Maternal Grandfather    Liver disease Maternal Grandfather    Cancer Maternal Grandfather        Prostate   Diabetes Maternal Grandmother    Alzheimer's disease Maternal Grandmother    Diabetes Paternal Grandfather    Cancer Paternal Grandfather        Lung, Prostate   Diabetes Paternal Grandmother    Alzheimer's disease Paternal Grandmother    Colon cancer Neg Hx    Esophageal cancer Neg Hx    Pancreatic cancer Neg Hx    Stomach cancer Neg Hx    Colon polyps Neg Hx    Rectal cancer Neg Hx     Social History:  reports that she quit smoking about 35 years ago. Her smoking use included cigarettes. She has never used smokeless tobacco. She reports that she does not currently use alcohol after a past usage of about 35.0 standard drinks of alcohol per week. She reports that she does not use drugs.  ROS: Pertinent ROS in HPI  Physical Exam: BP 99/66   Pulse 71   Ht 5' 2"$  (1.575 m)   Wt 160 lb (72.6 kg)   BMI 29.26 kg/m   Constitutional:  Well nourished. Alert and oriented, No acute distress. HEENT: [place] AT, moist mucus membranes.  Trachea midline Cardiovascular: No clubbing, cyanosis, or edema. Respiratory: Normal respiratory effort, no increased work of breathing. Neurologic: Grossly intact, no focal deficits, moving all 4 extremities. Psychiatric: Normal mood and affect.    Laboratory Data: N/A   Pertinent Imaging:  03/10/22 10:34  Scan Result 86     Assessment & Plan:  1. rUTI's -Asymptomatic today's visit  2. Vaginal atrophy -Continue vaginal cream Monday, Wednesday, Friday nights  3. SUI/urge incontinence -She try to continue  pelvic floor physical therapy and will contact me in a month regarding her status -May consider referral to a urogynecologist in the future, but it may be difficult meeting with a new provider   Return for regroup in one month .  These notes generated with voice recognition software. I apologize for typographical errors.  [place], [place] [address]  [place], [place] [address] [phone]

## 2022-03-10 ENCOUNTER — Encounter: Payer: Self-pay | Admitting: Urology

## 2022-03-10 ENCOUNTER — Ambulatory Visit: Payer: Commercial Managed Care - HMO | Admitting: Urology

## 2022-03-10 VITALS — BP 99/66 | HR 71 | Ht 62.0 in | Wt 160.0 lb

## 2022-03-10 DIAGNOSIS — N393 Stress incontinence (female) (male): Secondary | ICD-10-CM | POA: Diagnosis not present

## 2022-03-10 DIAGNOSIS — N952 Postmenopausal atrophic vaginitis: Secondary | ICD-10-CM

## 2022-03-10 DIAGNOSIS — N390 Urinary tract infection, site not specified: Secondary | ICD-10-CM

## 2022-03-10 DIAGNOSIS — Z87440 Personal history of urinary (tract) infections: Secondary | ICD-10-CM | POA: Diagnosis not present

## 2022-03-10 LAB — BLADDER SCAN AMB NON-IMAGING: Scan Result: 86

## 2022-03-13 ENCOUNTER — Ambulatory Visit: Payer: Commercial Managed Care - HMO | Admitting: Physical Therapy

## 2022-03-14 ENCOUNTER — Encounter: Payer: Commercial Managed Care - HMO | Admitting: Physical Therapy

## 2022-03-20 ENCOUNTER — Ambulatory Visit: Payer: Commercial Managed Care - HMO | Admitting: Physical Therapy

## 2022-03-20 ENCOUNTER — Other Ambulatory Visit: Payer: Self-pay

## 2022-03-21 ENCOUNTER — Ambulatory Visit: Payer: Commercial Managed Care - HMO | Admitting: Physical Therapy

## 2022-03-27 ENCOUNTER — Other Ambulatory Visit: Payer: Self-pay | Admitting: Family Medicine

## 2022-03-27 ENCOUNTER — Other Ambulatory Visit: Payer: Self-pay | Admitting: Psychiatry

## 2022-03-27 ENCOUNTER — Other Ambulatory Visit: Payer: Self-pay

## 2022-03-27 ENCOUNTER — Encounter: Payer: Commercial Managed Care - HMO | Admitting: Physical Therapy

## 2022-03-27 ENCOUNTER — Ambulatory Visit (INDEPENDENT_AMBULATORY_CARE_PROVIDER_SITE_OTHER): Payer: Commercial Managed Care - HMO | Admitting: Licensed Clinical Social Worker

## 2022-03-27 DIAGNOSIS — M545 Low back pain, unspecified: Secondary | ICD-10-CM

## 2022-03-27 DIAGNOSIS — E538 Deficiency of other specified B group vitamins: Secondary | ICD-10-CM

## 2022-03-27 DIAGNOSIS — G2581 Restless legs syndrome: Secondary | ICD-10-CM

## 2022-03-27 DIAGNOSIS — F4010 Social phobia, unspecified: Secondary | ICD-10-CM

## 2022-03-27 DIAGNOSIS — F4310 Post-traumatic stress disorder, unspecified: Secondary | ICD-10-CM

## 2022-03-27 DIAGNOSIS — F331 Major depressive disorder, recurrent, moderate: Secondary | ICD-10-CM

## 2022-03-27 MED ORDER — HYDROCODONE-ACETAMINOPHEN 5-325 MG PO TABS
1.0000 | ORAL_TABLET | Freq: Four times a day (QID) | ORAL | 0 refills | Status: DC | PRN
  Filled 2022-03-27: qty 15, 4d supply, fill #0

## 2022-03-27 MED ORDER — ROPINIROLE HCL 1 MG PO TABS
1.0000 mg | ORAL_TABLET | Freq: Every day | ORAL | 1 refills | Status: DC
  Filled 2022-03-27: qty 30, fill #0
  Filled 2022-05-22: qty 30, 30d supply, fill #0
  Filled 2022-06-18: qty 30, 30d supply, fill #1

## 2022-03-27 MED ORDER — OMEPRAZOLE 40 MG PO CPDR
40.0000 mg | DELAYED_RELEASE_CAPSULE | Freq: Two times a day (BID) | ORAL | 1 refills | Status: DC
  Filled 2022-03-27: qty 60, 60d supply, fill #0
  Filled 2022-05-22: qty 60, 60d supply, fill #1

## 2022-03-27 MED ORDER — HYDROXYZINE PAMOATE 25 MG PO CAPS
25.0000 mg | ORAL_CAPSULE | Freq: Two times a day (BID) | ORAL | 1 refills | Status: DC | PRN
  Filled 2022-03-27: qty 60, 30d supply, fill #0
  Filled 2022-05-22: qty 60, 30d supply, fill #1

## 2022-03-27 MED ORDER — BREXPIPRAZOLE 0.25 MG PO TABS
0.2500 mg | ORAL_TABLET | Freq: Every day | ORAL | 1 refills | Status: DC
  Filled 2022-03-27: qty 30, 30d supply, fill #0
  Filled 2022-04-24 – 2022-04-28 (×2): qty 30, 30d supply, fill #1

## 2022-03-27 MED ORDER — ESTRADIOL 0.1 MG/24HR TD PTTW
1.0000 | MEDICATED_PATCH | TRANSDERMAL | 1 refills | Status: DC
  Filled 2022-03-27: qty 8, 28d supply, fill #0
  Filled 2022-04-24 – 2022-04-28 (×2): qty 8, 28d supply, fill #1
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
## 2022-03-27 NOTE — Telephone Encounter (Signed)
Requesting: hydrocodone 5-'325mg'$   Contract: 07/19/21 UDS: 07/19/21 Last Visit: 10/25/21 Next Visit: None Last Refill: 08/01/21 #15 and 0RF   Please Advise

## 2022-03-28 ENCOUNTER — Other Ambulatory Visit: Payer: Self-pay

## 2022-03-28 ENCOUNTER — Other Ambulatory Visit: Payer: Self-pay | Admitting: Family Medicine

## 2022-03-28 DIAGNOSIS — Z1231 Encounter for screening mammogram for malignant neoplasm of breast: Secondary | ICD-10-CM

## 2022-04-03 ENCOUNTER — Encounter: Payer: Commercial Managed Care - HMO | Admitting: Physical Therapy

## 2022-04-07 ENCOUNTER — Ambulatory Visit (INDEPENDENT_AMBULATORY_CARE_PROVIDER_SITE_OTHER): Payer: Commercial Managed Care - HMO | Admitting: Family Medicine

## 2022-04-07 ENCOUNTER — Encounter: Payer: Self-pay | Admitting: Family Medicine

## 2022-04-07 VITALS — BP 100/78 | HR 66 | Temp 98.3°F | Resp 18 | Ht 62.0 in | Wt 159.0 lb

## 2022-04-07 DIAGNOSIS — Z0000 Encounter for general adult medical examination without abnormal findings: Secondary | ICD-10-CM | POA: Diagnosis not present

## 2022-04-07 NOTE — Patient Instructions (Signed)
Preventive Care 40-57 Years Old, Female Preventive care refers to lifestyle choices and visits with your health care provider that can promote health and wellness. Preventive care visits are also called wellness exams. What can I expect for my preventive care visit? Counseling Your health care provider may ask you questions about your: Medical history, including: Past medical problems. Family medical history. Pregnancy history. Current health, including: Menstrual cycle. Method of birth control. Emotional well-being. Home life and relationship well-being. Sexual activity and sexual health. Lifestyle, including: Alcohol, nicotine or tobacco, and drug use. Access to firearms. Diet, exercise, and sleep habits. Work and work environment. Sunscreen use. Safety issues such as seatbelt and bike helmet use. Physical exam Your health care provider will check your: Height and weight. These may be used to calculate your BMI (body mass index). BMI is a measurement that tells if you are at a healthy weight. Waist circumference. This measures the distance around your waistline. This measurement also tells if you are at a healthy weight and may help predict your risk of certain diseases, such as type 2 diabetes and high blood pressure. Heart rate and blood pressure. Body temperature. Skin for abnormal spots. What immunizations do I need?  Vaccines are usually given at various ages, according to a schedule. Your health care provider will recommend vaccines for you based on your age, medical history, and lifestyle or other factors, such as travel or where you work. What tests do I need? Screening Your health care provider may recommend screening tests for certain conditions. This may include: Lipid and cholesterol levels. Diabetes screening. This is done by checking your blood sugar (glucose) after you have not eaten for a while (fasting). Pelvic exam and Pap test. Hepatitis B test. Hepatitis C  test. HIV (human immunodeficiency virus) test. STI (sexually transmitted infection) testing, if you are at risk. Lung cancer screening. Colorectal cancer screening. Mammogram. Talk with your health care provider about when you should start having regular mammograms. This may depend on whether you have a family history of breast cancer. BRCA-related cancer screening. This may be done if you have a family history of breast, ovarian, tubal, or peritoneal cancers. Bone density scan. This is done to screen for osteoporosis. Talk with your health care provider about your test results, treatment options, and if necessary, the need for more tests. Follow these instructions at home: Eating and drinking  Eat a diet that includes fresh fruits and vegetables, whole grains, lean protein, and low-fat dairy products. Take vitamin and mineral supplements as recommended by your health care provider. Do not drink alcohol if: Your health care provider tells you not to drink. You are pregnant, may be pregnant, or are planning to become pregnant. If you drink alcohol: Limit how much you have to 0-1 drink a day. Know how much alcohol is in your drink. In the [place] one drink equals one 12 oz bottle of beer (355 mL), one 5 oz glass of wine (148 mL), or one 1 oz glass of hard liquor (44 mL). Lifestyle Brush your teeth every morning and night with fluoride toothpaste. Floss one time each day. Exercise for at least 30 minutes 5 or more days each week. Do not use any products that contain nicotine or tobacco. These products include cigarettes, chewing tobacco, and vaping devices, such as e-cigarettes. If you need help quitting, ask your health care provider. Do not use drugs. If you are sexually active, practice safe sex. Use a condom or other form of protection to   prevent STIs. If you do not wish to become pregnant, use a form of birth control. If you plan to become pregnant, see your health care provider for a  prepregnancy visit. Take aspirin only as told by your health care provider. Make sure that you understand how much to take and what form to take. Work with your health care provider to find out whether it is safe and beneficial for you to take aspirin daily. Find healthy ways to manage stress, such as: Meditation, yoga, or listening to music. Journaling. Talking to a trusted person. Spending time with friends and family. Minimize exposure to UV radiation to reduce your risk of skin cancer. Safety Always wear your seat belt while driving or riding in a vehicle. Do not drive: If you have been drinking alcohol. Do not ride with someone who has been drinking. When you are tired or distracted. While texting. If you have been using any mind-altering substances or drugs. Wear a helmet and other protective equipment during sports activities. If you have firearms in your house, make sure you follow all gun safety procedures. Seek help if you have been physically or sexually abused. What's next? Visit your health care provider once a year for an annual wellness visit. Ask your health care provider how often you should have your eyes and teeth checked. Stay up to date on all vaccines. This information is not intended to replace advice given to you by your health care provider. Make sure you discuss any questions you have with your health care provider. Document Revised: 07/07/2020 Document Reviewed: 07/07/2020 Elsevier Patient Education  2023 [employer]  

## 2022-04-07 NOTE — Progress Notes (Signed)
Subjective:   By signing my name below, I, [name], attest that this documentation has been prepared under the direction and in the presence of [name], DO 04/07/22   Patient ID: [name], female    DOB: April 26, 1965, 57 y.o.   MRN: [id number]  Chief Complaint  Patient presents with  . Annual Exam    Pt states fasting     HPI Patient is in today for a comprehensive physical exam.   She has to have a psychiatric exam. She is following up with Christina Hussami for counseling.   She reports having gained about 25 lbs. She has been off of Lexapro for about a month. She was craving a lot of sweets. She has been eating toasted coconut greek yogurt and adds low fat cottage cheese in it for a snack. She has been watching her calorie intake.   Last colonoscopy: 06/02/2020. Diverticulosis and internal hemorrhoids found. Otherwise normal. Biopsies taken from right colon, left colon, and transverse colon for evaluation of microscopic colitis.   Last mammogram: 02/03/2021. Results were normal. Next scheduled on 05/10/2022.   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   Past Medical History:  Diagnosis Date  . Allergy   . Anemia   . Anxiety   . Asthma   . Depression   . GERD (gastroesophageal reflux disease)   . Heart murmur    from childhood  . Hiatal hernia   . Hypertension   . Peptic ulcer   . PTSD (post-traumatic stress disorder)   . UTI (urinary tract infection)     Past Surgical History:  Procedure Laterality Date  . ABDOMINAL HYSTERECTOMY     fibroids  . BIOPSY  07/11/2018   Procedure: BIOPSY;  Surgeon: [name], MD;  Location: [facility] ENDOSCOPY;  Service: Endoscopy;;  . COLONOSCOPY  2014  . ESOPHAGOGASTRODUODENOSCOPY (EGD) WITH PROPOFOL N/A 07/11/2018   Procedure: ESOPHAGOGASTRODUODENOSCOPY (EGD) WITH PROPOFOL;  Surgeon: [name], MD;  Location: [facility] ENDOSCOPY;  Service: Endoscopy;  Laterality: N/A;  . GASTRIC BYPASS  2007  . HEMOSTASIS CLIP PLACEMENT   07/11/2018   Procedure: HEMOSTASIS CLIP PLACEMENT;  Surgeon: [name], MD;  Location: [place];  Service: Endoscopy;;  . LAPAROTOMY N/A 03/08/2018   Procedure: EXPLORATORY LAPAROTOMY, REPAIR AND PATCH CLOSURE OF PERFORATED ULCER;  Surgeon: Georganna Skeans, MD;  Location: [place];  Service: General;  Laterality: N/A;  . TUBAL LIGATION      Family History  Problem Relation Age of Onset  . Stroke Mother   . Alzheimer's disease Mother   . Hypertension Father   . Cancer Father        Prostate  . Alcohol abuse Sister   . Depression Sister   . Alcohol abuse Brother   . Depression Brother   . Diabetes Maternal Grandfather   . Liver disease Maternal Grandfather   . Cancer Maternal Grandfather        Prostate  . Diabetes Maternal Grandmother   . Alzheimer's disease Maternal Grandmother   . Diabetes Paternal Grandfather   . Cancer Paternal Grandfather        Lung, Prostate  . Diabetes Paternal Grandmother   . Alzheimer's disease Paternal Grandmother   . Colon cancer Neg Hx   . Esophageal cancer Neg Hx   . Pancreatic cancer Neg Hx   . Stomach cancer Neg Hx   . Colon polyps Neg Hx   . Rectal cancer Neg Hx     Social History  Socioeconomic History  . Marital status: Married    Spouse name: mark  . Number of children: 0  . Years of education: Not on file  . Highest education level: High school graduate  Occupational History    Employer: [employer]    Comment: [employer]  Tobacco Use  . Smoking status: Former    Types: Cigarettes    Quit date: 01/24/1987    Years since quitting: 35.2  . Smokeless tobacco: Never  Vaping Use  . Vaping Use: Former  . Substances: Flavoring  . Devices: has vaped with flavor but no nicotine. Been 8 years  Substance and Sexual Activity  . Alcohol use: Not Currently    Alcohol/week: 35.0 standard drinks of alcohol    Types: 35 Cans of beer per week  . Drug use: No  . Sexual activity: Not Currently  Other Topics Concern  . Not on  file  Social History Narrative   Regular exercise--no (because of knees)   Social Determinants of Health   Financial Resource Strain: Not on file  Food Insecurity: Not on file  Transportation Needs: Not on file  Physical Activity: Not on file  Stress: Not on file  Social Connections: Not on file  Intimate Partner Violence: Not on file    Outpatient Medications Prior to Visit  Medication Sig Dispense Refill  . Acetaminophen (TYLENOL 8 HOUR ARTHRITIS PAIN PO) Take 2 tablets by mouth as needed.    . brexpiprazole (REXULTI) 0.25 MG TABS tablet Take 1 tablet (0.25 mg total) by mouth daily. 30 tablet 1  . busPIRone (BUSPAR) 15 MG tablet Take 1 tablet by mouth 2 (two) times daily. Take daily at 9 AM and at 4 PM 180 tablet 0  . Cholecalciferol (VITAMIN D3) 50 MCG (2000 UT) capsule Take 1 capsule (2,000 Units total) by mouth daily. 100 capsule 0  . colestipol (COLESTID) 1 g tablet Take 1 tablet (1 g total) by mouth 2 (two) times daily. Please schedule an office visit for further refills. Thank you 60 tablet 2  . cyanocobalamin (VITAMIN B12) 1000 MCG/ML injection Inject 1 mL (1,000 mcg total) into the muscle every 30 (thirty) days. 10 mL 0  . cyclobenzaprine (FLEXERIL) 10 MG tablet Take 1 tablet (10 mg total) by mouth 3 (three) times daily as needed. for muscle spams 30 tablet 0  . Diclofenac Sodium 2 % SOLN Apply 1 pump twice daily as needed. 112 g 1  . escitalopram (LEXAPRO) 5 MG tablet Take 1 tablet (5 mg total) by mouth daily. Stop taking in 15 days 15 tablet 0  . estradiol (ESTRACE) 0.1 MG/GM vaginal cream Apply a pea-sized amount of cream to the fingertip and wipe in the front part of the vagina twice weekly 42.5 g 0  . estradiol (VIVELLE-DOT) 0.1 MG/24HR patch Place 1 patch (0.1 mg total) onto the skin 2 (two) times a week. 8 patch 1  . fluticasone (FLONASE) 50 MCG/ACT nasal spray Place 2 sprays into both nostrils daily. 16 g 5  . furosemide (LASIX) 40 MG tablet Take 1 tablet (40 mg total)  by mouth daily. 90 tablet 1  . Homeopathic Products (ALLERGY MEDICINE PO) Take 1 tablet by mouth as needed (Takes the Clortabs. Equate Brand).    Marland Kitchen HYDROcodone-acetaminophen (NORCO/VICODIN) 5-325 MG tablet Take 1 tablet by mouth every 6 (six) hours as needed. 15 tablet 0  . hydrOXYzine (VISTARIL) 25 MG capsule Take 1 capsule (25 mg total) by mouth 2 (two) times daily as needed for anxiety.  And sleep, racing thoughts 60 capsule 1  . loperamide (IMODIUM) 2 MG capsule Take 2 mg by mouth daily as needed.     Marland Kitchen omeprazole (PRILOSEC) 40 MG capsule Take 1 capsule (40 mg total) by mouth 2 (two) times daily. OPEN CAPSULE AND SPRINKLE ON APPLESAUCE. 60 capsule 1  . Probiotic Product (ALIGN) CHEW Chew 2 tablets by mouth daily. Align pre and probiotic with vitamain C-take one tablet each Align pre and probiotic with Vitamin b12 for immune and energy-take one tablet each    . rOPINIRole (REQUIP) 1 MG tablet Take 1 tablet (1 mg total) by mouth at bedtime. 30 tablet 1  . sertraline (ZOLOFT) 50 MG tablet Take 0.5-1 tablets (25-50 mg total) by mouth daily with breakfast. Take 25 mg ( half tablet ) daily for 15 days and then increase to 50 mg ( 1 tablet) after that. 30 tablet 1  . SYRINGE-NEEDLE, DISP, 3 ML (B-D 3CC LUER-LOK SYR 25GX5/8") 25G X 5/8" 3 ML MISC USE AS DIRECTED WITH B-12 INJECTION 16 each 0  . VITAMIN A PO Take by mouth.    . zolpidem (AMBIEN) 5 MG tablet Take 1 tablet (5 mg total) by mouth at bedtime as needed for sleep. 30 tablet 1   No facility-administered medications prior to visit.    Allergies  Allergen Reactions  . Lamictal [Lamotrigine] Rash  . Penicillins Other (See Comments)    Did it involve swelling of the face/tongue/throat, SOB, or low BP? Unk Did it involve sudden or severe rash/hives, skin peeling, or any reaction on the inside of your mouth or nose? Unk Did you need to seek medical attention at a hospital or doctor's office? Unk When did it last happen? Was 57 years old; reaction  not recalled     If all above answers are "NO", may proceed with cephalosporin use.   . Codeine Itching    Crazy dreams and itching-pt said she can take it with food    Review of Systems  Constitutional:  Negative for fever and malaise/fatigue.  HENT:  Negative for congestion.   Eyes:  Negative for blurred vision.  Respiratory:  Negative for shortness of breath.   Cardiovascular:  Negative for chest pain, palpitations and leg swelling.  Gastrointestinal:  Negative for abdominal pain, blood in stool and nausea.  Genitourinary:  Negative for dysuria and frequency.  Musculoskeletal:  Negative for falls.  Skin:  Negative for rash.  Neurological:  Negative for dizziness, loss of consciousness and headaches.  Endo/Heme/Allergies:  Negative for environmental allergies.  Psychiatric/Behavioral:  Negative for depression. The patient is not nervous/anxious.       Objective:    Physical Exam Vitals and nursing note reviewed.  Constitutional:      General: She is not in acute distress.    Appearance: Normal appearance. She is well-developed.  HENT:     Head: Normocephalic and atraumatic.     Right Ear: External ear normal.     Left Ear: External ear normal.     Nose: Nose normal.  Eyes:     Extraocular Movements: Extraocular movements intact.     Pupils: Pupils are equal, round, and reactive to light.  Cardiovascular:     Rate and Rhythm: Normal rate and regular rhythm.     Pulses: Normal pulses.     Heart sounds: Normal heart sounds. No murmur heard.    No gallop.  Pulmonary:     Effort: Pulmonary effort is normal. No respiratory distress.  Breath sounds: Normal breath sounds. No wheezing or rales.  Chest:     Chest wall: No tenderness.  Musculoskeletal:        General: Normal range of motion.     Cervical back: Normal range of motion and neck supple.  Skin:    General: Skin is warm and dry.  Neurological:     General: No focal deficit present.     Mental Status: She is  alert and oriented to person, place, and time.  Psychiatric:        Mood and Affect: Mood normal.        Behavior: Behavior normal.        Thought Content: Thought content normal.        Judgment: Judgment normal.    BP 100/78 (BP Location: Left Arm, Patient Position: Sitting, Cuff Size: Normal)   Pulse 66   Temp 98.3 F (36.8 C) (Oral)   Resp 18   Ht 5\' 2"  (1.575 m)   Wt 159 lb (72.1 kg)   SpO2 99%   BMI 29.08 kg/m  Wt Readings from Last 3 Encounters:  04/07/22 159 lb (72.1 kg)  03/10/22 160 lb (72.6 kg)  12/14/21 147 lb (66.7 kg)       Assessment & Plan:  Preventative health care -     CBC with Differential/Platelet -     Comprehensive metabolic panel -     Lipid panel -     TSH     I,Rachel Rivera,acting as a scribe for [name], DO.,have documented all relevant documentation on the behalf of [name], DO,as directed by  [name], DO while in the presence of [name], DO.   I, [name], personally preformed the services described in this documentation.  All medical record entries made by the scribe were at my direction and in my presence.  I have reviewed the chart and discharge instructions (if applicable) and agree that the record reflects my personal performance and is accurate and complete. 04/07/22   [name]

## 2022-04-08 LAB — CBC WITH DIFFERENTIAL/PLATELET
Absolute Monocytes: 427 cells/uL (ref 200–950)
Basophils Absolute: 48 cells/uL (ref 0–200)
Basophils Relative: 1 %
Eosinophils Absolute: 168 cells/uL (ref 15–500)
Eosinophils Relative: 3.5 %
HCT: 40.2 % (ref 35.0–45.0)
Hemoglobin: 13.2 g/dL (ref 11.7–15.5)
Lymphs Abs: 1728 cells/uL (ref 850–3900)
MCH: 27.8 pg (ref 27.0–33.0)
MCHC: 32.8 g/dL (ref 32.0–36.0)
MCV: 84.8 fL (ref 80.0–100.0)
MPV: 10.2 fL (ref 7.5–12.5)
Monocytes Relative: 8.9 %
Neutro Abs: 2429 cells/uL (ref 1500–7800)
Neutrophils Relative %: 50.6 %
Platelets: 221 10*3/uL (ref 140–400)
RBC: 4.74 10*6/uL (ref 3.80–5.10)
RDW: 14.3 % (ref 11.0–15.0)
Total Lymphocyte: 36 %
WBC: 4.8 10*3/uL (ref 3.8–10.8)

## 2022-04-08 LAB — LIPID PANEL
Cholesterol: 174 mg/dL (ref <200)
HDL: 66 mg/dL (ref >=50)
LDL Cholesterol (Calc): 92 mg/dL (calc)
Non-HDL Cholesterol (Calc): 108 mg/dL (calc) (ref <130)
Total CHOL/HDL Ratio: 2.6 (calc) (ref <5.0)
Triglycerides: 69 mg/dL (ref <150)

## 2022-04-08 LAB — COMPREHENSIVE METABOLIC PANEL
AG Ratio: 1.6 (calc) (ref 1.0–2.5)
ALT: 14 U/L (ref 6–29)
AST: 25 U/L (ref 10–35)
Albumin: 3.9 g/dL (ref 3.6–5.1)
Alkaline phosphatase (APISO): 74 U/L (ref 37–153)
BUN: 17 mg/dL (ref 7–25)
CO2: 26 mmol/L (ref 20–32)
Calcium: 9.1 mg/dL (ref 8.6–10.4)
Chloride: 107 mmol/L (ref 98–110)
Creat: 0.76 mg/dL (ref 0.50–1.03)
Globulin: 2.5 g/dL (calc) (ref 1.9–3.7)
Glucose, Bld: 89 mg/dL (ref 65–99)
Potassium: 4.3 mmol/L (ref 3.5–5.3)
Sodium: 142 mmol/L (ref 135–146)
Total Bilirubin: 0.4 mg/dL (ref 0.2–1.2)
Total Protein: 6.4 g/dL (ref 6.1–8.1)

## 2022-04-08 LAB — TSH: TSH: 1.87 mIU/L (ref 0.40–4.50)

## 2022-04-09 NOTE — Assessment & Plan Note (Signed)
Ghm utd Check labs  See AVS  Health Maintenance  Topic Date Due   COVID-19 Vaccine (5 - 2023-24 season) 09/23/2021   MAMMOGRAM  02/03/2022   DTaP/Tdap/Td (3 - Td or Tdap) 08/26/2028   COLONOSCOPY (Pts 45-54yrs Insurance coverage will need to be confirmed)  06/03/2030   INFLUENZA VACCINE  Completed   Hepatitis C Screening  Completed   HIV Screening  Completed   Zoster Vaccines- Shingrix  Completed   HPV VACCINES  Aged Out

## 2022-04-10 ENCOUNTER — Encounter: Payer: Commercial Managed Care - HMO | Admitting: Physical Therapy

## 2022-04-11 ENCOUNTER — Other Ambulatory Visit: Payer: Self-pay

## 2022-04-11 ENCOUNTER — Telehealth (INDEPENDENT_AMBULATORY_CARE_PROVIDER_SITE_OTHER): Payer: Commercial Managed Care - HMO | Admitting: Psychiatry

## 2022-04-11 ENCOUNTER — Encounter: Payer: Self-pay | Admitting: Psychiatry

## 2022-04-11 DIAGNOSIS — F4010 Social phobia, unspecified: Secondary | ICD-10-CM | POA: Diagnosis not present

## 2022-04-11 DIAGNOSIS — G2581 Restless legs syndrome: Secondary | ICD-10-CM | POA: Diagnosis not present

## 2022-04-11 DIAGNOSIS — F331 Major depressive disorder, recurrent, moderate: Secondary | ICD-10-CM

## 2022-04-11 DIAGNOSIS — F4310 Post-traumatic stress disorder, unspecified: Secondary | ICD-10-CM

## 2022-04-11 DIAGNOSIS — F1021 Alcohol dependence, in remission: Secondary | ICD-10-CM

## 2022-04-11 MED ORDER — SERTRALINE HCL 100 MG PO TABS
100.0000 mg | ORAL_TABLET | Freq: Every day | ORAL | 0 refills | Status: DC
  Filled 2022-04-11: qty 90, 90d supply, fill #0

## 2022-04-11 NOTE — Progress Notes (Unsigned)
Virtual Visit via Video Note  I connected with [name] on 04/11/22 at 10:30 AM EDT by a video enabled telemedicine application and verified that I am speaking with the correct person using two identifiers.  Location Provider Location : [facility] Patient Location : Home  Participants: Patient , Provider    I discussed the limitations of evaluation and management by telemedicine and the availability of in person appointments. The patient expressed understanding and agreed to proceed.   I discussed the assessment and treatment plan with the patient. The patient was provided an opportunity to ask questions and all were answered. The patient agreed with the plan and demonstrated an understanding of the instructions.   The patient was advised to call back or seek an in-person evaluation if the symptoms worsen or if the condition fails to improve as anticipated.                                                                        [facility] MD OP Progress Note  04/12/2022 3:36 PM David Kissinger  MRN:  [id number]  Chief Complaint:  Chief Complaint  Patient presents with   Follow-up   Anxiety   Depression   Medication Refill   HPI: [name] is a 57 year old Caucasian female, married, unemployed, has a history of PTSD, MDD, social anxiety disorder, RLS, alcohol use disorder in remission, chronic pain, [place] was evaluated by telemedicine today.  Patient today reports she had a good birthday celebration with her family.  She did enjoy it.  Although she was anxious she was able to cope okay.  Patient however reports since the past few nights she has been having vivid dreams.  That does interrupt her sleep although she is able to fall back asleep after a while.  She does have upcoming appointment with therapist, motivated to talk to her therapist about her recent vivid dreams.  Patient otherwise reports overall mood symptoms as improving.  She is compliant on the Zoloft which  is still a low dosage.  Was able to stop the Lexapro.  Did not have any withdrawal symptoms.  Denies any side effects to any of her other medications including Rexulti.  Was able to get labs completed per request from her primary care provider.  Reviewed and discussed the same with patient.  Pending hemoglobin A1c and prolactin.  Patient denies any suicidality, homicidality or perceptual disturbances.  Patient denies any other concerns today.  Visit Diagnosis:    ICD-10-CM   1. PTSD (post-traumatic stress disorder)  F43.10 sertraline (ZOLOFT) 100 MG tablet    2. MDD (major depressive disorder), recurrent episode, moderate (HCC)  F33.1 sertraline (ZOLOFT) 100 MG tablet    3. Social anxiety disorder  F40.10 sertraline (ZOLOFT) 100 MG tablet    4. RLS (restless legs syndrome)  G25.81     5. Alcohol use disorder, severe, in sustained remission ([place])  F10.21       Past Psychiatric History: Reviewed past theatric history from progress note on 04/04/2021.  Past trials of medications-Wellbutrin, Abilify, Zoloft, Xanax, Cymbalta, Pristiq, BuSpar, Deplin, Lexapro, Valium, Viibryd, Trintellix, Lamictal.  Patient with neuropsychological testing completed-05/05/2021-Dr. Rodenbough-does not meet criteria for ADHD.  Past Medical History:  Past Medical History:  Diagnosis Date   Allergy    Anemia    Anxiety    Asthma    Depression    GERD (gastroesophageal reflux disease)    Heart murmur    from childhood   Hiatal hernia    Hypertension    Peptic ulcer    PTSD (post-traumatic stress disorder)    UTI (urinary tract infection)     Past Surgical History:  Procedure Laterality Date   ABDOMINAL HYSTERECTOMY     fibroids   BIOPSY  07/11/2018   Procedure: BIOPSY;  Surgeon: [name], MD;  Location: [facility];  Service: Endoscopy;;   COLONOSCOPY  2014   ESOPHAGOGASTRODUODENOSCOPY (EGD) WITH PROPOFOL N/A 07/11/2018   Procedure: ESOPHAGOGASTRODUODENOSCOPY (EGD) WITH PROPOFOL;  Surgeon:  [name], MD;  Location: [facility] ENDOSCOPY;  Service: Endoscopy;  Laterality: N/A;   GASTRIC BYPASS  2007   HEMOSTASIS CLIP PLACEMENT  07/11/2018   Procedure: HEMOSTASIS CLIP PLACEMENT;  Surgeon: [name], MD;  Location: [facility];  Service: Endoscopy;;   LAPAROTOMY N/A 03/08/2018   Procedure: EXPLORATORY LAPAROTOMY, REPAIR AND PATCH CLOSURE OF PERFORATED ULCER;  Surgeon: Georganna Skeans, MD;  Location: [facility];  Service: General;  Laterality: N/A;   TUBAL LIGATION      Family Psychiatric History: Reviewed family psychiatric history from progress note on 04/04/2021.  Family History:  Family History  Problem Relation Age of Onset   Stroke Mother    Alzheimer's disease Mother    Hypertension Father    Cancer Father        Prostate   Alcohol abuse Sister    Depression Sister    Alcohol abuse Brother    Depression Brother    Diabetes Maternal Grandfather    Liver disease Maternal Grandfather    Cancer Maternal Grandfather        Prostate   Diabetes Maternal Grandmother    Alzheimer's disease Maternal Grandmother    Diabetes Paternal Grandfather    Cancer Paternal Grandfather        Lung, Prostate   Diabetes Paternal Grandmother    Alzheimer's disease Paternal Grandmother    Colon cancer Neg Hx    Esophageal cancer Neg Hx    Pancreatic cancer Neg Hx    Stomach cancer Neg Hx    Colon polyps Neg Hx    Rectal cancer Neg Hx     Social History: Reviewed social history from progress note on 04/04/2021. Social History   Socioeconomic History   Marital status: Married    Spouse name: mark   Number of children: 0   Years of education: Not on file   Highest education level: High school graduate  Occupational History    Employer: [employer]    Comment: [employer]  Tobacco Use   Smoking status: Former    Types: Cigarettes    Quit date: 01/24/1987    Years since quitting: 35.2   Smokeless tobacco: Never  Vaping Use   Vaping Use: Former   Substances: Flavoring    Devices: has vaped with flavor but no nicotine. Been 8 years  Substance and Sexual Activity   Alcohol use: Not Currently    Alcohol/week: 35.0 standard drinks of alcohol    Types: 35 Cans of beer per week   Drug use: No   Sexual activity: Not Currently  Other Topics Concern   Not on file  Social History Narrative   Regular exercise--no (because of knees)   Social Determinants of Health   Financial Resource Strain:  Not on file  Food Insecurity: Not on file  Transportation Needs: Not on file  Physical Activity: Not on file  Stress: Not on file  Social Connections: Not on file    Allergies:  Allergies  Allergen Reactions   Lamictal [Lamotrigine] Rash   Penicillins Other (See Comments)    Did it involve swelling of the face/tongue/throat, SOB, or low BP? Unk Did it involve sudden or severe rash/hives, skin peeling, or any reaction on the inside of your mouth or nose? Unk Did you need to seek medical attention at a hospital or doctor's office? Unk When did it last happen? Was 57 years old; reaction not recalled     If all above answers are "NO", may proceed with cephalosporin use.    Codeine Itching    Crazy dreams and itching-pt said she can take it with food    Metabolic Disorder Labs: No results found for: "HGBA1C", "MPG" No results found for: "PROLACTIN" Lab Results  Component Value Date   CHOL 174 04/07/2022   TRIG 69 04/07/2022   HDL 66 04/07/2022   CHOLHDL 2.6 04/07/2022   VLDL 9.0 07/19/2021   LDLCALC 92 04/07/2022   LDLCALC 70 07/19/2021   Lab Results  Component Value Date   TSH 1.87 04/07/2022   TSH 3.37 11/14/2021    Therapeutic Level Labs: No results found for: "LITHIUM" No results found for: "VALPROATE" No results found for: "CBMZ"  Current Medications: Current Outpatient Medications  Medication Sig Dispense Refill   Acetaminophen (TYLENOL 8 HOUR ARTHRITIS PAIN PO) Take 2 tablets by mouth as needed.     brexpiprazole (REXULTI) 0.25 MG TABS  tablet Take 1 tablet (0.25 mg total) by mouth daily. 30 tablet 1   busPIRone (BUSPAR) 15 MG tablet Take 1 tablet by mouth 2 (two) times daily. Take daily at 9 AM and at 4 PM 180 tablet 0   Cholecalciferol (VITAMIN D3) 50 MCG (2000 UT) capsule Take 1 capsule (2,000 Units total) by mouth daily. 100 capsule 0   colestipol (COLESTID) 1 g tablet Take 1 tablet (1 g total) by mouth 2 (two) times daily. Please schedule an office visit for further refills. Thank you 60 tablet 2   cyanocobalamin (VITAMIN B12) 1000 MCG/ML injection Inject 1 mL (1,000 mcg total) into the muscle every 30 (thirty) days. 10 mL 0   cyclobenzaprine (FLEXERIL) 10 MG tablet Take 1 tablet (10 mg total) by mouth 3 (three) times daily as needed. for muscle spams 30 tablet 0   Diclofenac Sodium 2 % SOLN Apply 1 pump twice daily as needed. 112 g 1   estradiol (ESTRACE) 0.1 MG/GM vaginal cream Apply a pea-sized amount of cream to the fingertip and wipe in the front part of the vagina twice weekly 42.5 g 0   estradiol (VIVELLE-DOT) 0.1 MG/24HR patch Place 1 patch (0.1 mg total) onto the skin 2 (two) times a week. 8 patch 1   fluticasone (FLONASE) 50 MCG/ACT nasal spray Place 2 sprays into both nostrils daily. 16 g 5   furosemide (LASIX) 40 MG tablet Take 1 tablet (40 mg total) by mouth daily. 90 tablet 1   Homeopathic Products (ALLERGY MEDICINE PO) Take 1 tablet by mouth as needed (Takes the Clortabs. Equate Brand).     HYDROcodone-acetaminophen (NORCO/VICODIN) 5-325 MG tablet Take 1 tablet by mouth every 6 (six) hours as needed. 15 tablet 0   hydrOXYzine (VISTARIL) 25 MG capsule Take 1 capsule (25 mg total) by mouth 2 (two) times daily as needed  for anxiety. And sleep, racing thoughts 60 capsule 1   loperamide (IMODIUM) 2 MG capsule Take 2 mg by mouth daily as needed.      omeprazole (PRILOSEC) 40 MG capsule Take 1 capsule (40 mg total) by mouth 2 (two) times daily. OPEN CAPSULE AND SPRINKLE ON APPLESAUCE. 60 capsule 1   Probiotic Product  (ALIGN) CHEW Chew 2 tablets by mouth daily. Align pre and probiotic with vitamain C-take one tablet each Align pre and probiotic with Vitamin b12 for immune and energy-take one tablet each     rOPINIRole (REQUIP) 1 MG tablet Take 1 tablet (1 mg total) by mouth at bedtime. 30 tablet 1   sertraline (ZOLOFT) 100 MG tablet Take 1 tablet (100 mg total) by mouth daily with breakfast. 90 tablet 0   SYRINGE-NEEDLE, DISP, 3 ML (B-D 3CC LUER-LOK SYR 25GX5/8") 25G X 5/8" 3 ML MISC USE AS DIRECTED WITH B-12 INJECTION 16 each 0   VITAMIN A PO Take by mouth.     zolpidem (AMBIEN) 5 MG tablet Take 1 tablet (5 mg total) by mouth at bedtime as needed for sleep. 30 tablet 1   No current facility-administered medications for this visit.     Musculoskeletal: Strength & Muscle Tone:  UTA Gait & Station:  Seated Patient leans: N/A  Psychiatric Specialty Exam: Review of Systems  Psychiatric/Behavioral:  Positive for sleep disturbance. The patient is nervous/anxious.   All other systems reviewed and are negative.   There were no vitals taken for this visit.There is no height or weight on file to calculate BMI.  General Appearance: Casual  Eye Contact:  Fair  Speech:  Clear and Coherent  Volume:  Normal  Mood:  Anxious  Affect:  Congruent  Thought Process:  Goal Directed and Descriptions of Associations: Intact  Orientation:  Full (Time, Place, and Person)  Thought Content: Logical   Suicidal Thoughts:  No  Homicidal Thoughts:  No  Memory:  Immediate;   Fair Recent;   Fair Remote;   Fair  Judgement:  Fair  Insight:  Fair  Psychomotor Activity:  Normal  Concentration:  Concentration: Fair and Attention Span: Fair  Recall:  [employer] of Knowledge: Fair  Language: Fair  Akathisia:  No  Handed:  Right  AIMS (if indicated): not done  Assets:  Communication Skills Desire for Improvement Housing Social Support  ADL's:  Intact  Cognition: WNL  Sleep:   has nightmares   Screenings: Biochemist, clinical Office Visit from 03/03/2022 in [place] Office Visit from 01/20/2022 in [facility] Office Visit from 11/17/2021 in [facility] Office Visit from 06/13/2021 in [facility] Office Visit from 05/25/2021 in [facility] Total Score 0 0 0 0 0      [place] Office Visit from 04/07/2022 in [facility] [place] Visit from 01/20/2022 in [facility] Office Visit from 11/17/2021 in [facility] Office Visit from 10/19/2021 in [facility] Office Visit from 09/15/2021 in [facility]  Total GAD-7 Score 18 19 20 19 19       PHQ2-9    [place] Visit from 04/07/2022 in [facility] [place] Visit from 01/20/2022 in  [facility] Office Visit from 11/17/2021 in [place] Office Visit from 10/25/2021 in [facility] at [place] Visit from 10/19/2021 in [facility]  PHQ-2 Total Score 5 4 5  0 6  PHQ-9 Total Score 19 16 21  -- 23      Flowsheet Row Video Visit from 04/11/2022 in [facility] [facility] from 03/27/2022 in [facility] at [facility] from 03/06/2022 in [facility] at [place] Low Risk Low Risk Low Risk        Assessment and Plan: [name] is a 57 year old Caucasian female, has a history of PTSD, MDD, social anxiety, alcohol  use disorder in remission, chronic pain was evaluated by telemedicine today.  Patient is currently tolerating Zoloft although continues to have trauma related symptoms, will benefit from medication readjustment, continued therapy.  Plan as noted below.  Plan PTSD-improving Continue CBT with Ms. Christina Hussami Discontinue Lexapro. Increase Zoloft to 100 mg p.o. daily BuSpar 15 mg p.o. twice daily-reduced dosage Ambien 2.5-5 milligram nightly as needed Reviewed [place] PMP AWARxE  MDD-improving Rexulti 0.25 mg daily Increase Zoloft to 100 mg p.o. daily Hydroxyzine 25 mg p.o. twice daily as needed for anxiety Continue CBT with Ms. Christina Hussami  Social anxiety disorder-improving Continue CBT  Restless leg syndrome-improving Requip 1 mg p.o. nightly  Alcohol use disorder in remission Sober since February 2022  Reviewed and discussed labs lipid panel-04/07/2022-within normal limits, CMP, CBC, TSH-within normal limits.  Pending hemoglobin A1c, prolactin.  Will consider ordering this in the future.  Follow-up in clinic in 6 weeks or sooner if needed.  Collaboration of Care: Collaboration of Care: Other I have reviewed notes per Ms. Christina Hussami, patient encouraged to continue CBT.  Patient/Guardian was advised Release of Information must be obtained prior to any record release in order to collaborate their care with an outside provider. Patient/Guardian was advised if they have not already done so to contact the registration department to sign all necessary forms in order for [place] to release information regarding their care.   Consent: Patient/Guardian gives verbal consent for treatment and assignment of benefits for services provided during this visit. Patient/Guardian expressed understanding and agreed to proceed.   This note was generated in part or whole with voice recognition software. Voice recognition is usually quite accurate but there are transcription errors that can and  very often do occur. I apologize for any typographical errors that were not detected and corrected.    Ursula Alert, MD 04/12/2022, 3:36 PM

## 2022-04-17 ENCOUNTER — Ambulatory Visit (INDEPENDENT_AMBULATORY_CARE_PROVIDER_SITE_OTHER): Payer: Commercial Managed Care - HMO | Admitting: Licensed Clinical Social Worker

## 2022-04-17 ENCOUNTER — Encounter: Payer: Commercial Managed Care - HMO | Admitting: Physical Therapy

## 2022-04-17 DIAGNOSIS — F4310 Post-traumatic stress disorder, unspecified: Secondary | ICD-10-CM

## 2022-04-17 NOTE — Progress Notes (Signed)
Virtual Visit via Video Note  I connected with [name] on 04/17/22  at  9:00 AM EDT by a video enabled telemedicine application and verified that I am speaking with the correct person using two identifiers.  Location: Patient: home Provider: remote office [place], [place])   I discussed the limitations of evaluation and management by telemedicine and the availability of in person appointments. The patient expressed understanding and agreed to proceed.   I discussed the assessment and treatment plan with the patient. The patient was provided an opportunity to ask questions and all were answered. The patient agreed with the plan and demonstrated an understanding of the instructions.   The patient was advised to call back or seek an in-person evaluation if the symptoms worsen or if the condition fails to improve as anticipated.  I provided 45 minutes of non-face-to-face time during this encounter.   [place], LCSW   THERAPIST PROGRESS NOTE - Session Time: [phone]  Participation Level: Active  Behavioral Response: Neat and Well GroomedAlertDepressed  Type of Therapy: Individual Therapy  Treatment Goals addressed: Reduce the negative impact trauma related symptoms have on social, occupational, and family functioning per pt self report 3 out of 5 sessions documented.   : Reduction in intrusive event recollections, avoidance of event reminders, intense arousal, or disinterest in activities or relationships  as evidenced by pt self report 3 out of 5 sessions documente   ProgressTowards Goals: Progressing  Interventions: CBT and Other: trauma focused  Summary: [name] is a 57 y.o. female who presents with continuing symptoms related to PTSD and depression.  Patient reports that recently she has noticed an increase in her nightmares and flashbacks.  Patient states that she has really bad nightmares where she feels that she is being held down in her sleep.  Patient  reports that she is compliant with her medication, and is compliant with her psychiatric appointments.  Allowed pt to explore and express thoughts and feelings associated with recent life situations and external stressors.  Allow patient safe space to explore the content of her nightmares, and feelings that are triggered.  Allowed patient to explore time of year-patient states that she just realized that she just celebrated her birthday, and she used to always celebrate her birthday with her late cousin.  Patient reports that her cousin died in a tragic car accident, and patient gets triggered by this and often feels guilty on her own birthday.  Patient states that she has a lot of negative memories, as this funeral was a very emotional funeral, and patient was only 57 years old at the time.  Patient states that on her birthday recently, she went out to eat with family members, and the particular restaurant presented her in one of the person with 2 shots of tequila.  Patient states she found this very triggering, and felt aversive towards the alcohol as soon as she saw it.  Patient states that another family member quickly took the alcohol away from her, and she felt much better.  Patient states that sometimes she does experience cravings, but does not feel she will relapse.  Reviewed coping skills for managing PTSD flashbacks, depression, and anxiety/panic.  Recommended patient continue positive imagery, mindfulness, meditation.  Encouraged patient to download the PTSD app, and use it frequently.  Patient states her disability hearing was rescheduled on April 4.  Patient states that she is not aware of what will happen at that time, but is seeing the disability psychiatrist.  Encouraged patient to trust the psychiatrist, and to open up to them as much as possible.  Patient states that she has been spending a lot of time with her father, and is helping him with his work Sales promotion account executive.  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  Patient states that she enjoys working with her father, and enjoys spending time with her sister's dog, [name] Sessions.  Patient feels that the Zoloft is a better fit for her than Lexapro-patient feels that her eating is better regulated and she has lost 5 pounds since going on the Zoloft.  Discussed pros and cons of patient's marriage-patient states that she would make some positive changes in her life if her disability goes through.  Continued recommendations are as follows: self care behaviors, positive social engagements, focusing on overall work/home/life balance, and focusing on positive physical and emotional wellness.   Suicidal/Homicidal: No.   Therapist Response: Pt is continuing to apply interventions learned in session into daily life situations. Pt is currently on track to meet goals utilizing interventions mentioned above. Personal growth and progress noted. Treatment to continue as indicated.   Patient continues to make good progress with identification of trauma triggers, and identification of coping skills that are helpful to her.  Patient is demonstrating a growing capacity for finding pleasure in recreational activities.  Plan: Return again in 4 weeks.  Diagnosis:  Encounter Diagnosis  Name Primary?   PTSD (post-traumatic stress disorder) Yes    Collaboration of Care: Other pt encouraged to continue care with psychiatrist of record, Dr. Ursula Alert  Patient/Guardian was advised Release of Information must be obtained prior to any record release in order to collaborate their care with an outside provider. Patient/Guardian was advised if they have not already done so to contact the registration department to sign all necessary forms in order for [place] to release information regarding their care.   Consent: Patient/Guardian gives verbal consent for treatment and assignment of benefits for services  provided during this visit. Patient/Guardian expressed understanding and agreed to proceed.   [place], LCSW 04/17/2022

## 2022-04-18 ENCOUNTER — Ambulatory Visit: Payer: Commercial Managed Care - HMO | Admitting: Physical Therapy

## 2022-04-20 ENCOUNTER — Other Ambulatory Visit: Payer: Self-pay | Admitting: Psychiatry

## 2022-04-20 ENCOUNTER — Other Ambulatory Visit: Payer: Self-pay

## 2022-04-20 DIAGNOSIS — F4310 Post-traumatic stress disorder, unspecified: Secondary | ICD-10-CM

## 2022-04-20 MED FILL — Zolpidem Tartrate Tab 5 MG: ORAL | 30 days supply | Qty: 30 | Fill #0 | Status: AC

## 2022-04-21 ENCOUNTER — Other Ambulatory Visit: Payer: Self-pay

## 2022-04-24 ENCOUNTER — Other Ambulatory Visit: Payer: Self-pay

## 2022-04-24 ENCOUNTER — Other Ambulatory Visit: Payer: Self-pay | Admitting: Gastroenterology

## 2022-04-24 ENCOUNTER — Encounter: Payer: Commercial Managed Care - HMO | Admitting: Physical Therapy

## 2022-04-24 ENCOUNTER — Other Ambulatory Visit: Payer: Self-pay | Admitting: Family Medicine

## 2022-04-24 DIAGNOSIS — E538 Deficiency of other specified B group vitamins: Secondary | ICD-10-CM

## 2022-04-24 DIAGNOSIS — M545 Low back pain, unspecified: Secondary | ICD-10-CM

## 2022-04-24 MED ORDER — CYCLOBENZAPRINE HCL 10 MG PO TABS
10.0000 mg | ORAL_TABLET | Freq: Three times a day (TID) | ORAL | 2 refills | Status: DC | PRN
  Filled 2022-04-24: qty 30, 10d supply, fill #0
  Filled 2022-05-25: qty 30, 10d supply, fill #1
  Filled 2022-07-23: qty 30, 10d supply, fill #2

## 2022-04-24 MED ORDER — "BD LUER-LOK SYRINGE 25G X 5/8"" 3 ML MISC"
2 refills | Status: AC
  Filled 2022-04-24 – 2022-09-27 (×4): qty 16, fill #0

## 2022-04-24 MED ORDER — CYANOCOBALAMIN 1000 MCG/ML IJ SOLN
1000.0000 ug | INTRAMUSCULAR | 0 refills | Status: DC
  Filled 2022-04-24 (×2): qty 3, 90d supply, fill #0
  Filled 2022-07-31: qty 3, 90d supply, fill #1
  Filled 2022-10-13: qty 3, 90d supply, fill #2
  Filled 2023-01-30: qty 1, 30d supply, fill #3

## 2022-04-25 ENCOUNTER — Other Ambulatory Visit: Payer: Self-pay

## 2022-04-25 ENCOUNTER — Ambulatory Visit: Payer: Commercial Managed Care - HMO | Admitting: Physical Therapy

## 2022-04-27 ENCOUNTER — Other Ambulatory Visit: Payer: Self-pay

## 2022-04-28 ENCOUNTER — Other Ambulatory Visit: Payer: Self-pay

## 2022-05-01 ENCOUNTER — Encounter: Payer: Commercial Managed Care - HMO | Admitting: Physical Therapy

## 2022-05-01 ENCOUNTER — Other Ambulatory Visit: Payer: Self-pay

## 2022-05-02 ENCOUNTER — Ambulatory Visit: Payer: Commercial Managed Care - HMO | Attending: Urology | Admitting: Physical Therapy

## 2022-05-02 ENCOUNTER — Other Ambulatory Visit: Payer: Self-pay

## 2022-05-02 DIAGNOSIS — R2689 Other abnormalities of gait and mobility: Secondary | ICD-10-CM | POA: Insufficient documentation

## 2022-05-02 DIAGNOSIS — M5459 Other low back pain: Secondary | ICD-10-CM

## 2022-05-02 DIAGNOSIS — M533 Sacrococcygeal disorders, not elsewhere classified: Secondary | ICD-10-CM | POA: Diagnosis present

## 2022-05-02 DIAGNOSIS — M542 Cervicalgia: Secondary | ICD-10-CM | POA: Diagnosis present

## 2022-05-02 NOTE — Therapy (Signed)
OUTPATIENT PHYSICAL THERAPY TREATMENT / Recert    Patient Name: [name] MRN: [id number] DOB:[birth year], 57 y.o., female Today's Date: 05/02/2022   PT End of Session - 05/02/22 1551     Visit Number 5    Number of Visits 15    Date for PT Re-Evaluation 07/11/22    PT Start Time 1548    PT Stop Time 1630    PT Time Calculation (min) 42 min    Activity Tolerance Patient tolerated treatment well    Behavior During Therapy [facility] for tasks assessed/performed             Past Medical History:  Diagnosis Date   Allergy    Anemia    Anxiety    Asthma    Depression    GERD (gastroesophageal reflux disease)    Heart murmur    from childhood   Hiatal hernia    Hypertension    Peptic ulcer    PTSD (post-traumatic stress disorder)    UTI (urinary tract infection)    Past Surgical History:  Procedure Laterality Date   ABDOMINAL HYSTERECTOMY     fibroids   BIOPSY  07/11/2018   Procedure: BIOPSY;  Surgeon: Meryl Dare, MD;  Location: [facility] ENDOSCOPY;  Service: Endoscopy;;   COLONOSCOPY  2014   ESOPHAGOGASTRODUODENOSCOPY (EGD) WITH PROPOFOL N/A 07/11/2018   Procedure: ESOPHAGOGASTRODUODENOSCOPY (EGD) WITH PROPOFOL;  Surgeon: Meryl Dare, MD;  Location: [facility] ENDOSCOPY;  Service: Endoscopy;  Laterality: N/A;   GASTRIC BYPASS  2007   HEMOSTASIS CLIP PLACEMENT  07/11/2018   Procedure: HEMOSTASIS CLIP PLACEMENT;  Surgeon: Meryl Dare, MD;  Location: [facility] ENDOSCOPY;  Service: Endoscopy;;   LAPAROTOMY N/A 03/08/2018   Procedure: EXPLORATORY LAPAROTOMY, REPAIR AND PATCH CLOSURE OF PERFORATED ULCER;  Surgeon: Violeta Gelinas, MD;  Location: [facility] OR;  Service: General;  Laterality: N/A;   TUBAL LIGATION     Patient Active Problem List   Diagnosis Date Noted   At risk for prolonged QT interval syndrome 10/19/2021   PTSD (post-traumatic stress disorder) 04/04/2021   MDD (major depressive disorder), recurrent episode, moderate 04/04/2021   Social anxiety disorder 04/04/2021    Dizziness 11/04/2020   Epistaxis 11/04/2020   Chest pain of uncertain etiology 11/04/2020   Anxiety 09/23/2020   Attention and concentration deficit 09/23/2020   Degenerative spondylolisthesis 04/05/2020   Low back pain with radiation 02/06/2020   Bilateral lower extremity edema 04/07/2019   Pain in both lower extremities 04/07/2019   Numbness in both hands 04/07/2019   Pain in right arm 04/07/2019   Recurrent urinary tract infection 10/24/2018   Menopausal syndrome 10/24/2018   Abnormal mammogram 10/24/2018   Abdominal pain, epigastric    Marginal ulcers    Melena 07/10/2018   Alcohol use disorder, severe, in sustained remission 07/10/2018   Encounter for screening for HIV 07/10/2018   Asthma 07/10/2018   Exposure to COVID-19 virus 05/28/2018   GERD (gastroesophageal reflux disease) 05/28/2018   Free intraperitoneal air 03/08/2018   Perforated ulcer 03/08/2018   Cervical radiculopathy at C7 02/22/2018   Essential hypertension 08/02/2017   Hematoma of hip, left, initial encounter 06/28/2017   Neck pain 06/28/2017   Nonallopathic lesion of cervical region 06/28/2017   Nonallopathic lesion of thoracic region 06/28/2017   Nonallopathic lesion of lumbar region 06/28/2017   Biomechanical lesion, unspecified 06/28/2017   Muscle spasm 04/20/2017   URI (upper respiratory infection) 10/02/2016   Preventative health care 05/01/2016   Degenerative arthritis of knee, bilateral 06/02/2015  Patellofemoral arthritis of left knee 05/04/2015   Cough 09/02/2013   Acute pharyngitis 09/02/2013   Sinusitis 06/25/2013   Dog bite of face 04/16/2013   Gastroenteritis 04/16/2013   Allergic rhinitis 04/16/2013   Mass of soft tissue 08/02/2012   RLS (restless legs syndrome) 01/21/2009   LYMPHOPENIA 03/23/2008   Depression 02/27/2008   HEMANGIOMA, SKIN 02/04/2008   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 02/19/2007    PCP: Shaune Pollack, DO  REFERRING PROVIDER: Orlie Pollen DIAG:  N39.3  (ICD-10-CM) - SUI (stress urinary incontinence, female)   Rationale for Evaluation and Treatment Rehabilitation  THERAPY DIAG:  Other abnormalities of gait and mobility  Sacrococcygeal disorders, not elsewhere classified  Other low back pain  Coccyx pain  Neck pain  ONSET DATE:   02/05/20 woke up out of bed unable to walk and have balance after working one job with repetitive task   SUBJECTIVE:                                                                                                                                                                                           SUBJECTIVE STATEMENT TODAY: Pt reports she kept up with her exercise when she could, every other day. Pt has decreased pain in her neck with  dull constant pain in her neck 3/10 now compared to moderate pain 4-5/10.  LBP no longer radiates to foot, it stays at the thigh. No change with tailbone pain. Occasional leakage and it is a little bit better. Air is passing out of her vagina  like she is passing gas.   SUBJECTIVE STATEMENT EVAL  01/30/22: 1)  neck pain:  constant 4-5/10.  Every now and then 10/10 with looking up and then back down quickly. Pt used to work a Engineer, manufacturing systems with looking down repeatedly. Pt also worked at [employer] which involved looking down and up. Tingling occurs down L UE last 3 fingers  occurs 20-30% of the time. It is rare.    2) radiating CLBP : constant dull pain. Sometime radiating down L LE across medial thigh and foot , sometimes radiates down B.  LIfting, bending , walking.    3) Urinary leakage occurs with sneezing and leakage before making it to the bathroom and with lifting   4) tailbone pain: Pt feels she sitting on tailbone. She feels like her tailbone is "pushing out of her throat" 4-5/10.  Numbness occurred and pain occurred after sitting for 10 min    PERTINENT HISTORY:  Gastric bypass, hysterectomy. Tubal ligation, PTSD , being filed on disability,  PAIN:  Are you having pain? See above PRECAUTIONS: None  WEIGHT BEARING RESTRICTIONS: No  FALLS:  Has patient fallen in last 6 months?Yes, fell since November 2023 . 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 Pt lost the Energy managerkit packer job and went full time at [employer].   PLOF: Independent  PATIENT GOALS:  "Give it my best that I can do"  Get back to walking 27K compared to currently 5K   OBJECTIVE:     HOME EXERCISE PROGRAM: See pt instruction section    ASSESSMENT:  CLINICAL IMPRESSION: Improvements:  Pt reports she kept up with her exercise when she could, every other day. Pt has decreased pain in her neck with  dull constant pain in her neck 3/10 now compared to moderate pain 4-5/10.  LBP no longer radiates to foot, it stays at the thigh. No change with tailbone pain. Occasional leakage and it is a little bit better. Air is passing out of her vagina  like she is passing gas.                 Plan to add deep core strengthening at next session.   Pt benefits from skilled PT.    OBJECTIVE IMPAIRMENTS decreased activity tolerance, decreased coordination, decreased endurance, decreased mobility, difficulty walking, decreased ROM, decreased strength, decreased safety awareness, hypomobility, increased muscle spasms, impaired flexibility, improper body mechanics, postural dysfunction, and pain. scar restrictions   ACTIVITY LIMITATIONS  self-care, home chores, work tasks    PARTICIPATION LIMITATIONS:  community   PERSONAL FACTORS   Hysterectomy, gastric bypass, past falls onto tailbone, uneven  pelvic girdle alignment ( plan to assess leg length difference next session)   are also affecting patient's functional outcome.    REHAB POTENTIAL: Good   CLINICAL DECISION MAKING: Evolving/moderate complexity   EVALUATION COMPLEXITY: Moderate    PATIENT EDUCATION:    Education details: Showed pt anatomy images. Explained muscles attachments/ connection, physiology of deep core system/ spinal- thoracic-pelvis-lower kinetic chain as they relate to pt's presentation, Sx, and past Hx. Explained what and how these areas of deficits need to be restored to balance and function    See Therapeutic activity / neuromuscular re-education section  Answered pt's questions.   Person educated: Patient Education method: Explanation, Demonstration, Tactile cues, Verbal cues, and Handouts Education comprehension: verbalized understanding, returned demonstration, verbal cues required, tactile cues required, and needs further education     PLAN: PT FREQUENCY: 1x/week   PT DURATION: 10 weeks   PLANNED INTERVENTIONS: Therapeutic exercises, Therapeutic activity, Neuromuscular re-education, Balance training, Gait training, Patient/Family education, Self Care, Joint mobilization, Spinal mobilization, Moist heat, Taping, and Manual therapy, dry needling.   PLAN FOR NEXT SESSION: See clinical impression for plan     GOALS: Goals reviewed with patient? Yes  SHORT TERM GOALS: Target date: 04/10/2022    Pt will demo IND with HEP                    Baseline: Not IND            Goal status: MET  LONG TERM GOALS: Target date: 07/11/2022     1.Pt will demo proper deep core coordination without chest breathing and optimal excursion of diaphragm/pelvic floor in order to promote spinal stability and pelvic floor function  Baseline: dyscoordination Goal status:MET  2.  Pt will demo > 5 pt change on FOTO  to improve QOL and function   PFDI Urinary baseline - 42   Lumber baseline  - 42 pts   Goal  status: INITIAL  3.  Pt will demo proper body mechanics in against gravity tasks and ADLs  work tasks, fitness  to minimize straining pelvic floor / back                  Baseline: not IND, improper form that places strain on pelvic floor                Goal status: MET     4. Pt will demo increased gait speed > 1.3 m/s and increase steps > 15K  in order to ambulate safely in community and return to fitness routine  Baseline:  0.9 m/s decreased LLE with L trunk lean , 5 K  steps   ( 05/02/22: 1.07 m/s  no trunk lean)   Goal status:  Partially Met     5. Pt will demo levelled pelvic girdle and shoulder height in order to progress to deep core strengthening HEP and restore mobility at spine, pelvis, gait, posture   Baseline:  Goal status: MET    6. Pt will report being able to sit for > 30 min without tailbone pain and numbness in order to participate in social activities  Baseline: pain after 10 min with numbness  ( 05/02/22: 15-20 min)  Goal status: Partially met   7. Pt will report being able to make it to the bathroom before leakage  after drinking 50-70 fl oz  Baseline: leakage before getting to bathroom  ( 05/02/22:  10% less episodes )  Goal status:  Partially Met   8. Pt will increase cervical ROM by 2-5 deg each direction to improve pain and turning to drive and look up and down to minimize falls  Baseline:   cervical ext 50 deg,  ( 05/02/22: 60 deg) flexion 55deg,     ( 05/02/22: 40 deg not met)             L rotation 30 deg     , R rotation  38 deg   ( 05/02/22: 40deg B )  Goal Status: partially met     Mariane Masters, PT 05/02/2022, 3:52 PM

## 2022-05-02 NOTE — Patient Instructions (Signed)
° ° °  kitchen counter stretches ° °Hands on kitchen counter,  ° °Palms shoulder width apart  °Minisquat postion °Trunk is parallel to floor ° °A) Pull buttocks back to lengthen spine, knees bent  °3 breaths ° ° °B) Bring R hand to the L, and stretch the R side trunk  °3 breaths  ° °Brings hands to center again °Do the same to the L side stretch by placing L hand on top of R  ° °D) Modified thread the needle °R hand on L thigh, °L  thigh pushing out slightly as the R hands pull in,  elbow bent and pulls to theR,  °Look under L armpit  ° °Do the same to other side ° °____ ° ° °

## 2022-05-08 ENCOUNTER — Ambulatory Visit (INDEPENDENT_AMBULATORY_CARE_PROVIDER_SITE_OTHER): Payer: Commercial Managed Care - HMO | Admitting: Licensed Clinical Social Worker

## 2022-05-08 ENCOUNTER — Encounter: Payer: Commercial Managed Care - HMO | Admitting: Physical Therapy

## 2022-05-08 DIAGNOSIS — F331 Major depressive disorder, recurrent, moderate: Secondary | ICD-10-CM

## 2022-05-08 DIAGNOSIS — F4310 Post-traumatic stress disorder, unspecified: Secondary | ICD-10-CM | POA: Diagnosis not present

## 2022-05-08 DIAGNOSIS — Z636 Dependent relative needing care at home: Secondary | ICD-10-CM | POA: Diagnosis not present

## 2022-05-08 NOTE — Progress Notes (Signed)
Virtual Visit via Video Note  I connected with [name] on 05/08/22  at  8:00 AM EDT by a video enabled telemedicine application and verified that I am speaking with the correct person using two identifiers.  Location: Patient: home Provider: remote office [place], [place])   I discussed the limitations of evaluation and management by telemedicine and the availability of in person appointments. The patient expressed understanding and agreed to proceed.   I discussed the assessment and treatment plan with the patient. The patient was provided an opportunity to ask questions and all were answered. The patient agreed with the plan and demonstrated an understanding of the instructions.   The patient was advised to call back or seek an in-person evaluation if the symptoms worsen or if the condition fails to improve as anticipated.  I provided 45 minutes of non-face-to-face time during this encounter.   Gen Clagg R Marcene Laskowski, LCSW   THERAPIST PROGRESS NOTE - Session Time: [phone]  Participation Level: Active  Behavioral Response: Neat and Well GroomedAlertDepressed  Type of Therapy: Individual Therapy  Treatment Goals addressed: Reduce the negative impact trauma related symptoms have on social, occupational, and family functioning per pt self report 3 out of 5 sessions documented.   : Reduction in intrusive event recollections, avoidance of event reminders, intense arousal, or disinterest in activities or relationships  as evidenced by pt self report 3 out of 5 sessions documente   ProgressTowards Goals: Progressing  Interventions: CBT and Other: trauma focused  Summary: [name] is a 57 y.o. female who presents with continuing symptoms related to PTSD and depression.  Patient reports that she has been compliant with her medication, and feels that it has been managing symptoms well.  Patient reports inconsistent quality and quantity of sleep due to current caregiver role for  her husband.  Clinician assisted pt with identifying situations/scenarios/schemas triggering anxiety and/or depression symptoms. Allowed pt to explore and express thoughts and feelings and discussed current coping mechanisms. Reviewed changes/recommendations.  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  Patient reports that he recently had a medical crisis including a blood clot, surgery, and uncontrolled diabetes.  Patient states that she has been focusing on assisting with his wellbeing for several weeks now.  Patient states that her presence of being in the hospital itself was a trigger for her.  Patient states that there were times when she would have to get in her car and just breeze to soothe the panic that she was feeling.  Encouraged patient to continue focusing on her own wellness and self-care as she is continuing this caregiver role.  Patient states that she has been intentional about self-care activities.  Patient states that she has identified triggers recently--patient states that she was sitting on her porch, and heard her neighbors playing softball.  Patient states that this reminded her of when she was younger and playing softball, and was one of the lower runners due to her weight.  Patient states that she felt singled out, and the coach would make her stay after and run extra laps.  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  Discussed these flashbacks, and how powerful they are at triggering thoughts and feelings.  Patient states that she has recognized that the thoughts are not facts, and she has tried to use her tools to reframe the negative thoughts.  Continued recommendations are as follows: self care behaviors, positive social engagements, focusing on  overall work/home/life balance, and focusing on positive physical and emotional wellness.   Suicidal/Homicidal: No.   Therapist Response: Pt is continuing to apply  interventions learned in session into daily life situations. Pt is currently on track to meet goals utilizing interventions mentioned above. Personal growth and progress noted. Treatment to continue as indicated.   Patient continues to make good progress with identification of trauma triggers, and identification of coping skills that are helpful to her.  Patient is continuing to focus on her overall self-care activities and behaviors to regulate stress currently.  Plan: Return again in 4 weeks.  Diagnosis:  Encounter Diagnoses  Name Primary?   PTSD (post-traumatic stress disorder) Yes   MDD (major depressive disorder), recurrent episode, moderate    Caregiver stress    Collaboration of Care: Other pt encouraged to continue care with psychiatrist of record, Dr. Jomarie Longs  Patient/Guardian was advised Release of Information must be obtained prior to any record release in order to collaborate their care with an outside provider. Patient/Guardian was advised if they have not already done so to contact the registration department to sign all necessary forms in order for [place] to release information regarding their care.   Consent: Patient/Guardian gives verbal consent for treatment and assignment of benefits for services provided during this visit. Patient/Guardian expressed understanding and agreed to proceed.   Ernest Haber Jaasia Viglione, LCSW 05/08/2022

## 2022-05-09 ENCOUNTER — Ambulatory Visit: Payer: Commercial Managed Care - HMO | Admitting: Physical Therapy

## 2022-05-10 ENCOUNTER — Ambulatory Visit
Admission: RE | Admit: 2022-05-10 | Discharge: 2022-05-10 | Disposition: A | Discharge status: Home / Self Care | Payer: Commercial Managed Care - HMO | Source: Ambulatory Visit | Attending: Family Medicine | Admitting: Family Medicine

## 2022-05-10 DIAGNOSIS — Z1231 Encounter for screening mammogram for malignant neoplasm of breast: Secondary | ICD-10-CM

## 2022-05-11 ENCOUNTER — Encounter: Payer: Self-pay | Admitting: Psychiatry

## 2022-05-11 ENCOUNTER — Telehealth (INDEPENDENT_AMBULATORY_CARE_PROVIDER_SITE_OTHER): Payer: Commercial Managed Care - HMO | Admitting: Psychiatry

## 2022-05-11 ENCOUNTER — Other Ambulatory Visit: Payer: Self-pay

## 2022-05-11 DIAGNOSIS — Z79899 Other long term (current) drug therapy: Secondary | ICD-10-CM

## 2022-05-11 DIAGNOSIS — F3341 Major depressive disorder, recurrent, in partial remission: Secondary | ICD-10-CM

## 2022-05-11 DIAGNOSIS — F4310 Post-traumatic stress disorder, unspecified: Secondary | ICD-10-CM | POA: Diagnosis not present

## 2022-05-11 DIAGNOSIS — G2581 Restless legs syndrome: Secondary | ICD-10-CM

## 2022-05-11 DIAGNOSIS — F4010 Social phobia, unspecified: Secondary | ICD-10-CM | POA: Diagnosis not present

## 2022-05-11 DIAGNOSIS — F1021 Alcohol dependence, in remission: Secondary | ICD-10-CM

## 2022-05-11 MED ORDER — BUSPIRONE HCL 15 MG PO TABS
15.0000 mg | ORAL_TABLET | Freq: Two times a day (BID) | ORAL | 0 refills | Status: DC
Start: 2022-05-11 — End: 2022-09-21
  Filled 2022-05-11 – 2022-06-18 (×2): qty 180, 90d supply, fill #0

## 2022-05-11 MED ORDER — BREXPIPRAZOLE 0.25 MG PO TABS
0.2500 mg | ORAL_TABLET | Freq: Every day | ORAL | 1 refills | Status: DC
Start: 2022-05-11 — End: 2022-07-04
  Filled 2022-05-11 – 2022-05-25 (×2): qty 30, 30d supply, fill #0
  Filled 2022-06-26: qty 30, 30d supply, fill #1

## 2022-05-11 NOTE — Progress Notes (Signed)
Virtual Visit via Video Note  I connected with [name] on 05/11/22 at  2:00 PM EDT by a video enabled telemedicine application and verified that I am speaking with the correct person using two identifiers.  Location Provider Location : [facility] Patient Location : Home  Participants: Patient , Provider   I discussed the limitations of evaluation and management by telemedicine and the availability of in person appointments. The patient expressed understanding and agreed to proceed. I discussed the assessment and treatment plan with the patient. The patient was provided an opportunity to ask questions and all were answered. The patient agreed with the plan and demonstrated an understanding of the instructions.   The patient was advised to call back or seek an in-person evaluation if the symptoms worsen or if the condition fails to improve as anticipated.  BH MD OP Progress Note  05/12/2022 12:40 PM [name]  MRN:  [id number]  Chief Complaint:  Chief Complaint  Patient presents with   Depression   Anxiety   Medication Refill   Follow-up   HPI: [name] is a 57 year old Caucasian female, married, unemployed, has a history of PTSD, MDD, social anxiety disorder, RLS, alcohol use disorder in remission, chronic pain, lives in [place], was evaluated by telemedicine today.  Patient today reports she has noticed some benefit from the higher dosage of the sertraline.  She however continues to struggle with trauma related symptoms.  Reports she has had anxiety attacks in public places.  She reports she is currently working with her therapist on developing coping skills, currently making use of an app which is available on her mobile phone.  She reports she believes she is going in the right direction with it.  Patient reports sleep is overall okay except that her pain does have an impact on her sleep at times.  She takes the Ambien and falls asleep around 9 PM.  Reports she gets  around 7 hours of sleep.  Reports she wakes up often at 4 AM due to the pain and however is able to fall back asleep after a while.  Patient denies any suicidality, homicidality or perceptual disturbances.  Patient reports she had her disability evaluation completed currently awaiting them to reach out to her regarding what they determined.  Patient denies any other concerns today.    Visit Diagnosis:    ICD-10-CM   1. PTSD (post-traumatic stress disorder)  F43.10 Hemoglobin A1C    Prolactin    busPIRone (BUSPAR) 15 MG tablet    brexpiprazole (REXULTI) 0.25 MG TABS tablet    2. Recurrent major depressive disorder, in partial remission  F33.41 Hemoglobin A1C    Prolactin    busPIRone (BUSPAR) 15 MG tablet    brexpiprazole (REXULTI) 0.25 MG TABS tablet    3. Social anxiety disorder  F40.10     4. RLS (restless legs syndrome)  G25.81     5. High risk medication use  Z79.899 Hemoglobin A1C    Prolactin    6. Alcohol use disorder, severe, in sustained remission  F10.21       Past Psychiatric History: I have reviewed past psychiatric history from progress note on 04/04/2021.  Trials of medications-Wellbutrin, Abilify, Zoloft, Xanax, Cymbalta, Pristiq, BuSpar, Deplin, Lexapro, Valium, Viibryd, Trintellix, Lamictal.  Patient had psychological testing completed-05/05/2021-Dr. Rodenbough-does not meet criteria for ADHD.  Past Medical History:  Past Medical History:  Diagnosis Date   Allergy    Anemia    Anxiety    Asthma  Depression    GERD (gastroesophageal reflux disease)    Heart murmur    from childhood   Hiatal hernia    Hypertension    Peptic ulcer    PTSD (post-traumatic stress disorder)    UTI (urinary tract infection)     Past Surgical History:  Procedure Laterality Date   ABDOMINAL HYSTERECTOMY     fibroids   BIOPSY  07/11/2018   Procedure: BIOPSY;  Surgeon: Meryl Dare, MD;  [facility] ENDOSCOPY;  Service: Endoscopy;;   COLONOSCOPY  2014    ESOPHAGOGASTRODUODENOSCOPY (EGD) WITH PROPOFOL N/A 07/11/2018   Procedure: ESOPHAGOGASTRODUODENOSCOPY (EGD) WITH PROPOFOL;  Surgeon: Meryl Dare, MD;  Location: [facility] ENDOSCOPY;  Service: Endoscopy;  Laterality: N/A;   GASTRIC BYPASS  2007   HEMOSTASIS CLIP PLACEMENT  07/11/2018   Procedure: HEMOSTASIS CLIP PLACEMENT;  Surgeon: Meryl Dare, MD;  Location[facility] ENDOSCOPY;  Service: Endoscopy;;   LAPAROTOMY N/A 03/08/2018   Procedure: EXPLORATORY LAPAROTOMY, REPAIR AND PATCH CLOSURE OF PERFORATED ULCER;  Surgeon: Violeta Gelinas, MD;  Location[facility] OR;  Service: General;  Laterality: N/A;   TUBAL LIGATION      Family Psychiatric History: I have reviewed family psychiatric history from progress note on 04/04/2021.  Family History:  Family History  Problem Relation Age of Onset   Stroke Mother    Alzheimer's disease Mother    Hypertension Father    Cancer Father        Prostate   Alcohol abuse Sister    Depression Sister    Alcohol abuse Brother    Depression Brother    Diabetes Maternal Grandfather    Liver disease Maternal Grandfather    Cancer Maternal Grandfather        Prostate   Diabetes Maternal Grandmother    Alzheimer's disease Maternal Grandmother    Diabetes Paternal Grandfather    Cancer Paternal Grandfather        Lung, Prostate   Diabetes Paternal Grandmother    Alzheimer's disease Paternal Grandmother    Colon cancer Neg Hx    Esophageal cancer Neg Hx    Pancreatic cancer Neg Hx    Stomach cancer Neg Hx    Colon polyps Neg Hx    Rectal cancer Neg Hx     Social History: I have reviewed social history from progress note on 04/04/2021. Social History   Socioeconomic History   Marital status: Married    Spouse name: mark   Number of children: 0   Years of education: Not on file   Highest education level: High school graduate  Occupational History    Employer: [employer]    Comment: [employer]  Tobacco Use   Smoking status: Former    Types: Cigarettes     Quit date: 01/24/1987    Years since quitting: 35.3   Smokeless tobacco: Never  Vaping Use   Vaping Use: Former   Substances: Flavoring   Devices: has vaped with flavor but no nicotine. Been 8 years  Substance and Sexual Activity   Alcohol use: Not Currently    Alcohol/week: 35.0 standard drinks of alcohol    Types: 35 Cans of beer per week   Drug use: No   Sexual activity: Not Currently  Other Topics Concern   Not on file  Social History Narrative   Regular exercise--no (because of knees)   Social Determinants of Health   Financial Resource Strain: Not on file  Food Insecurity: Not on file  Transportation Needs: Not on file  Physical Activity: Not on file  Stress: Not on file  Social Connections: Not on file    Allergies:  Allergies  Allergen Reactions   Lamictal [Lamotrigine] Rash   Penicillins Other (See Comments)    Did it involve swelling of the face/tongue/throat, SOB, or low BP? Unk Did it involve sudden or severe rash/hives, skin peeling, or any reaction on the inside of your mouth or nose? Unk Did you need to seek medical attention at a hospital or doctor's office? Unk When did it last happen? Was 57 years old; reaction not recalled     If all above answers are "NO", may proceed with cephalosporin use.    Codeine Itching    Crazy dreams and itching-pt said she can take it with food    Metabolic Disorder Labs: No results found for: "HGBA1C", "MPG" No results found for: "PROLACTIN" Lab Results  Component Value Date   CHOL 174 04/07/2022   TRIG 69 04/07/2022   HDL 66 04/07/2022   CHOLHDL 2.6 04/07/2022   VLDL 9.0 07/19/2021   LDLCALC 92 04/07/2022   LDLCALC 70 07/19/2021   Lab Results  Component Value Date   TSH 1.87 04/07/2022   TSH 3.37 11/14/2021    Therapeutic Level Labs: No results found for: "LITHIUM" No results found for: "VALPROATE" No results found for: "CBMZ"  Current Medications: Current Outpatient Medications  Medication Sig  Dispense Refill   Acetaminophen (TYLENOL 8 HOUR ARTHRITIS PAIN PO) Take 2 tablets by mouth as needed.     brexpiprazole (REXULTI) 0.25 MG TABS tablet Take 1 tablet (0.25 mg total) by mouth daily. 30 tablet 1   busPIRone (BUSPAR) 15 MG tablet Take 1 tablet by mouth 2 (two) times daily. Take daily at 9 AM and at 4 PM 180 tablet 0   Cholecalciferol (VITAMIN D3) 50 MCG (2000 UT) capsule Take 1 capsule (2,000 Units total) by mouth daily. 100 capsule 0   colestipol (COLESTID) 1 g tablet Take 1 tablet (1 g total) by mouth 2 (two) times daily. Please schedule an office visit for further refills. Thank you 60 tablet 2   cyanocobalamin (VITAMIN B12) 1000 MCG/ML injection Inject 1 mL (1,000 mcg total) into the muscle every 30 (thirty) days. 10 mL 0   cyclobenzaprine (FLEXERIL) 10 MG tablet Take 1 tablet (10 mg total) by mouth 3 (three) times daily as needed. for muscle spams 30 tablet 2   Diclofenac Sodium 2 % SOLN Apply 1 pump twice daily as needed. 112 g 1   estradiol (ESTRACE) 0.1 MG/GM vaginal cream Apply a pea-sized amount of cream to the fingertip and wipe in the front part of the vagina twice weekly 42.5 g 0   estradiol (VIVELLE-DOT) 0.1 MG/24HR patch Place 1 patch (0.1 mg total) onto the skin 2 (two) times a week. 8 patch 1   fluticasone (FLONASE) 50 MCG/ACT nasal spray Place 2 sprays into both nostrils daily. 16 g 5   furosemide (LASIX) 40 MG tablet Take 1 tablet (40 mg total) by mouth daily. 90 tablet 1   Homeopathic Products (ALLERGY MEDICINE PO) Take 1 tablet by mouth as needed (Takes the Clortabs. Equate Brand).     HYDROcodone-acetaminophen (NORCO/VICODIN) 5-325 MG tablet Take 1 tablet by mouth every 6 (six) hours as needed. 15 tablet 0   hydrOXYzine (VISTARIL) 25 MG capsule Take 1 capsule (25 mg total) by mouth 2 (two) times daily as needed for anxiety. And sleep, racing thoughts 60 capsule 1   loperamide (IMODIUM) 2 MG capsule  Take 2 mg by mouth daily as needed.      omeprazole (PRILOSEC) 40 MG  capsule Take 1 capsule (40 mg total) by mouth 2 (two) times daily. OPEN CAPSULE AND SPRINKLE ON APPLESAUCE. 60 capsule 1   Probiotic Product (ALIGN) CHEW Chew 2 tablets by mouth daily. Align pre and probiotic with vitamain C-take one tablet each Align pre and probiotic with Vitamin b12 for immune and energy-take one tablet each     rOPINIRole (REQUIP) 1 MG tablet Take 1 tablet (1 mg total) by mouth at bedtime. 30 tablet 1   sertraline (ZOLOFT) 100 MG tablet Take 1 tablet (100 mg total) by mouth daily with breakfast. 90 tablet 0   SYRINGE-NEEDLE, DISP, 3 ML (B-D 3CC LUER-LOK SYR 25GX5/8") 25G X 5/8" 3 ML MISC USE AS DIRECTED WITH B-12 INJECTION 16 each 2   VITAMIN A PO Take by mouth.     zolpidem (AMBIEN) 5 MG tablet Take 1 tablet (5 mg total) by mouth at bedtime as needed for sleep. 30 tablet 1   No current facility-administered medications for this visit.     Musculoskeletal: Strength & Muscle Tone:  UTA Gait & Station:  Seated Patient leans: N/A  Psychiatric Specialty Exam: Review of Systems  Psychiatric/Behavioral:  The patient is nervous/anxious.   All other systems reviewed and are negative.   There were no vitals taken for this visit.There is no height or weight on file to calculate BMI.  General Appearance: Casual  Eye Contact:  Fair  Speech:  Clear and Coherent  Volume:  Normal  Mood:  Anxious  Affect:  Appropriate  Thought Process:  Goal Directed and Descriptions of Associations: Intact  Orientation:  Full (Time, Place, and Person)  Thought Content: Logical   Suicidal Thoughts:  No  Homicidal Thoughts:  No  Memory:  Immediate;   Fair Recent;   Fair Remote;   Fair  Judgement:  Fair  Insight:  Fair  Psychomotor Activity:  Normal  Concentration:  Concentration: Fair and Attention Span: Fair  Recall:  [employer] of Knowledge: Fair  Language: Fair  Akathisia:  No  Handed:  Right  AIMS (if indicated): not done  Assets:  Communication Skills Desire for  Improvement Housing Social Support  ADL's:  Intact  Cognition: WNL  Sleep:   Overall okay   Screenings: Geneticist, molecular Office Visit from 03/03/2022 in [facility] Office Visit from 01/20/2022 in [facility] Office Visit from 11/17/2021 in [facility] Office Visit from 06/13/2021 in [facility] Office Visit from 05/25/2021 in [facility]  AIMS Total Score 0 0 0 0 0      GAD-7    Flowsheet Row Office Visit from 04/07/2022 in [facility] Office Visit from 01/20/2022 in [facility] Office Visit from 11/17/2021 in [facility] Office Visit from 10/19/2021 in [facility] Office Visit from 09/15/2021 in [facility]  Total GAD-7 Score PHQ2-9    Flowsheet Row Office Visit from 04/07/2022 in [facility] Office Visit from 01/20/2022 in [facility] Office Visit from 11/17/2021 in [facility] Office Visit from  10/25/2021 in [facility] Office Visit from 10/19/2021 in [facility]  PHQ-2 Total Score 5 4 5  0 6  PHQ-9 Total Score 19 16 21  -- 23      Flowsheet Row Counselor from 05/08/2022 in [facility] from 04/17/2022 in [facility] at [facility] Video Visit from 04/11/2022 in [facility]  C-SSRS RISK CATEGORY Low Risk Low Risk Low Risk         Assessment and Plan: [name] is a 57 year old, Asian female, has a history of PTSD, MDD, social anxiety, alcohol use disorder in remission, chronic pain was evaluated by telemedicine today.  Patient with improvement on the Zoloft however continues to have anxiety/trauma related symptoms, will benefit from continued psychotherapy sessions, discussed plan as noted below.  Plan PTSD-improving Continue CBT with Ms. Christina Hussami Zoloft 100 mg p.o. daily BuSpar 15 mg p.o. twice daily-reduced dosage Ambien 2.5-5 mg p.o. nightly as needed  MDD in partial remission Rexulti 0.25 mg p.o. nightly Zoloft 100 mg p.o. daily Hydroxyzine 25 mg p.o. twice daily as needed Continue CBT with Ms. Christina  Social anxiety disorder-improving Continue CBT  Restless leg syndrome-improving Requip 1 mg p.o. nightly  Alcohol use disorder in remission Sober since February 2022.  High risk medication use-will order hemoglobin A1c, prolactin-patient to go to [facility] lab.  These labs needs to be monitored since patient is on Rexulti.  Follow-up in clinic in 8 weeks or sooner in person.     Consent: Patient/Guardian gives verbal consent for treatment and assignment of benefits for services provided during this visit. Patient/Guardian expressed understanding and agreed to proceed.  This note was generated in part or whole with voice recognition software. Voice recognition is usually quite accurate but there are transcription errors that can and very often do occur. I apologize for any typographical errors that were not detected and corrected.     Jomarie Longs, MD 05/12/2022, 12:40 PM

## 2022-05-15 ENCOUNTER — Encounter: Payer: Commercial Managed Care - HMO | Admitting: Physical Therapy

## 2022-05-16 ENCOUNTER — Ambulatory Visit: Payer: Commercial Managed Care - HMO | Admitting: Physical Therapy

## 2022-05-17 ENCOUNTER — Ambulatory Visit (HOSPITAL_COMMUNITY): Payer: Commercial Managed Care - HMO | Admitting: Licensed Clinical Social Worker

## 2022-05-22 ENCOUNTER — Other Ambulatory Visit: Payer: Self-pay | Admitting: Gastroenterology

## 2022-05-22 ENCOUNTER — Encounter: Payer: Commercial Managed Care - HMO | Admitting: Physical Therapy

## 2022-05-22 ENCOUNTER — Other Ambulatory Visit: Payer: Self-pay

## 2022-05-22 MED FILL — Colestipol HCl Tab 1 GM: ORAL | 30 days supply | Qty: 60 | Fill #0 | Status: AC

## 2022-05-23 ENCOUNTER — Ambulatory Visit: Payer: Commercial Managed Care - HMO | Admitting: Physical Therapy

## 2022-05-23 MED FILL — Zolpidem Tartrate Tab 5 MG: ORAL | 30 days supply | Qty: 30 | Fill #1 | Status: AC

## 2022-05-24 ENCOUNTER — Other Ambulatory Visit: Payer: Self-pay

## 2022-05-24 ENCOUNTER — Other Ambulatory Visit
Admission: RE | Admit: 2022-05-24 | Discharge: 2022-05-24 | Disposition: A | Discharge status: Home / Self Care | Payer: Commercial Managed Care - HMO | Attending: Psychiatry | Admitting: Psychiatry

## 2022-05-24 DIAGNOSIS — F4310 Post-traumatic stress disorder, unspecified: Secondary | ICD-10-CM | POA: Diagnosis present

## 2022-05-24 DIAGNOSIS — Z79899 Other long term (current) drug therapy: Secondary | ICD-10-CM

## 2022-05-24 DIAGNOSIS — F3341 Major depressive disorder, recurrent, in partial remission: Secondary | ICD-10-CM

## 2022-05-24 LAB — HEMOGLOBIN A1C
Hgb A1c MFr Bld: 5.3 % (ref 4.8–5.6)
Mean Plasma Glucose: 105.41 mg/dL

## 2022-05-25 ENCOUNTER — Other Ambulatory Visit: Payer: Self-pay

## 2022-05-25 ENCOUNTER — Other Ambulatory Visit: Payer: Self-pay | Admitting: Family Medicine

## 2022-05-25 MED ORDER — HYDROCODONE-ACETAMINOPHEN 5-325 MG PO TABS
1.0000 | ORAL_TABLET | Freq: Four times a day (QID) | ORAL | 0 refills | Status: DC | PRN
  Filled 2022-05-25: qty 15, 4d supply, fill #0

## 2022-05-25 NOTE — Telephone Encounter (Signed)
Requesting: [facility] Contract: 07/19/21 UDS: 07/19/2021 Last OV: 04/07/2022 Next OV: N/A Last Refill: 03/27/2022, #15--0 RF Database:   Please advise

## 2022-05-26 ENCOUNTER — Other Ambulatory Visit: Payer: Self-pay

## 2022-05-26 LAB — PROLACTIN: Prolactin: 16.9 ng/mL (ref 3.6–25.2)

## 2022-05-29 ENCOUNTER — Encounter: Payer: Commercial Managed Care - HMO | Admitting: Physical Therapy

## 2022-05-30 ENCOUNTER — Ambulatory Visit: Payer: Commercial Managed Care - HMO | Attending: Urology | Admitting: Physical Therapy

## 2022-05-30 DIAGNOSIS — M5459 Other low back pain: Secondary | ICD-10-CM | POA: Insufficient documentation

## 2022-05-30 DIAGNOSIS — R2689 Other abnormalities of gait and mobility: Secondary | ICD-10-CM | POA: Diagnosis present

## 2022-05-30 DIAGNOSIS — M533 Sacrococcygeal disorders, not elsewhere classified: Secondary | ICD-10-CM | POA: Diagnosis present

## 2022-05-30 DIAGNOSIS — M542 Cervicalgia: Secondary | ICD-10-CM

## 2022-05-30 NOTE — Therapy (Unsigned)
OUTPATIENT PHYSICAL THERAPY TREATMENT / Recert    Patient Name: [name] MRN: [id number] DOB:Nov 04, 1965, 57 y.o., female Today's Date: 05/30/2022   PT End of Session - 05/30/22 1556     Visit Number 6    Number of Visits 15    Date for PT Re-Evaluation 07/11/22    PT Start Time 1553    PT Stop Time 1634    PT Time Calculation (min) 41 min    Activity Tolerance Patient tolerated treatment well    Behavior During Therapy [facility] for tasks assessed/performed             Past Medical History:  Diagnosis Date   Allergy    Anemia    Anxiety    Asthma    Depression    GERD (gastroesophageal reflux disease)    Heart murmur    from childhood   Hiatal hernia    Hypertension    Peptic ulcer    PTSD (post-traumatic stress disorder)    UTI (urinary tract infection)    Past Surgical History:  Procedure Laterality Date   ABDOMINAL HYSTERECTOMY     fibroids   BIOPSY  07/11/2018   Procedure: BIOPSY;  Surgeon: Meryl Dare, MD;  Location: [facility] ENDOSCOPY;  Service: Endoscopy;;   COLONOSCOPY  2014   ESOPHAGOGASTRODUODENOSCOPY (EGD) WITH PROPOFOL N/A 07/11/2018   Procedure: ESOPHAGOGASTRODUODENOSCOPY (EGD) WITH PROPOFOL;  Surgeon: Meryl Dare, MD;  Location: [facility] ENDOSCOPY;  Service: Endoscopy;  Laterality: N/A;   GASTRIC BYPASS  2007   HEMOSTASIS CLIP PLACEMENT  07/11/2018   Procedure: HEMOSTASIS CLIP PLACEMENT;  Surgeon: Meryl Dare, MD;  Location[facility] ENDOSCOPY;  Service: Endoscopy;;   LAPAROTOMY N/A 03/08/2018   Procedure: EXPLORATORY LAPAROTOMY, REPAIR AND PATCH CLOSURE OF PERFORATED ULCER;  Surgeon: Violeta Gelinas, MD;  Location: [facility] OR;  Service: General;  Laterality: N/A;   TUBAL LIGATION     Patient Active Problem List   Diagnosis Date Noted   At risk for prolonged QT interval syndrome 10/19/2021   PTSD (post-traumatic stress disorder) 04/04/2021   MDD (major depressive disorder), recurrent episode, moderate (HCC) 04/04/2021   Social anxiety disorder 04/04/2021    Dizziness 11/04/2020   Epistaxis 11/04/2020   Chest pain of uncertain etiology 11/04/2020   Anxiety 09/23/2020   Attention and concentration deficit 09/23/2020   Degenerative spondylolisthesis 04/05/2020   Low back pain with radiation 02/06/2020   Bilateral lower extremity edema 04/07/2019   Pain in both lower extremities 04/07/2019   Numbness in both hands 04/07/2019   Pain in right arm 04/07/2019   Recurrent urinary tract infection 10/24/2018   Menopausal syndrome 10/24/2018   Abnormal mammogram 10/24/2018   Abdominal pain, epigastric    Marginal ulcers    Melena 07/10/2018   Alcohol use disorder, severe, in sustained remission (HCC) 07/10/2018   Encounter for screening for HIV 07/10/2018   Asthma 07/10/2018   Exposure to COVID-19 virus 05/28/2018   GERD (gastroesophageal reflux disease) 05/28/2018   Free intraperitoneal air 03/08/2018   Perforated ulcer (HCC) 03/08/2018   Cervical radiculopathy at C7 02/22/2018   Essential hypertension 08/02/2017   Hematoma of hip, left, initial encounter 06/28/2017   Neck pain 06/28/2017   Nonallopathic lesion of cervical region 06/28/2017   Nonallopathic lesion of thoracic region 06/28/2017   Nonallopathic lesion of lumbar region 06/28/2017   Biomechanical lesion, unspecified 06/28/2017   Muscle spasm 04/20/2017   URI (upper respiratory infection) 10/02/2016   Preventative health care 05/01/2016   Degenerative arthritis of knee, bilateral  06/02/2015   Patellofemoral arthritis of left knee 05/04/2015   Cough 09/02/2013   Acute pharyngitis 09/02/2013   Sinusitis 06/25/2013   Dog bite of face 04/16/2013   Gastroenteritis 04/16/2013   Allergic rhinitis 04/16/2013   Mass of soft tissue 08/02/2012   RLS (restless legs syndrome) 01/21/2009   LYMPHOPENIA 03/23/2008   Depression 02/27/2008   HEMANGIOMA, SKIN 02/04/2008   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 02/19/2007    PCP: Shaune Pollack, DO  REFERRING PROVIDER: Marvel Plan    REFERRING DIAG:  N39.3 (ICD-10-CM) - SUI (stress urinary incontinence, female)   Rationale for Evaluation and Treatment Rehabilitation  THERAPY DIAG:  Other abnormalities of gait and mobility  Sacrococcygeal disorders, not elsewhere classified  Other low back pain  Coccyx pain  Neck pain  ONSET DATE:   02/05/20 woke up out of bed unable to walk and have balance after working one job with repetitive task   SUBJECTIVE:                                                                                                                                                                                           SUBJECTIVE STATEMENT TODAY: Pt reports neck and lumbar pain has been alright.  Passing gas has occurred more the last few days due to sitting around more.   SUBJECTIVE STATEMENT EVAL  01/30/22: 1)  neck pain:  constant 4-5/10.  Every now and then 10/10 with looking up and then back down quickly. Pt used to work a Engineer, manufacturing systems with looking down repeatedly. Pt also worked at [employer] which involved looking down and up. Tingling occurs down L UE last 3 fingers  occurs 20-30% of the time. It is rare.    2) radiating CLBP : constant dull pain. Sometime radiating down L LE across medial thigh and foot , sometimes radiates down B.  LIfting, bending , walking.    3) Urinary leakage occurs with sneezing and leakage before making it to the bathroom and with lifting   4) tailbone pain: Pt feels she sitting on tailbone. She feels like her tailbone is "pushing out of her throat" 4-5/10.  Numbness occurred and pain occurred after sitting for 10 min    PERTINENT HISTORY:  Gastric bypass, hysterectomy. Tubal ligation, PTSD , being filed on disability,   PAIN:  Are you having pain? See above PRECAUTIONS: None  WEIGHT BEARING RESTRICTIONS: No  FALLS:  Has patient fallen in last 6 months?Yes, fell since November 2023 . 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 Pt lost the Energy manager job and went full time at [employer].   PLOF: Independent  PATIENT GOALS:  "Give it my best that I can do"  Get back to walking 27K compared to currently 5K   OBJECTIVE:     HOME EXERCISE PROGRAM: See pt instruction section    ASSESSMENT:  CLINICAL IMPRESSION:                 Plan to add deep core strengthening at next session.   Pt benefits from skilled PT.    OBJECTIVE IMPAIRMENTS decreased activity tolerance, decreased coordination, decreased endurance, decreased mobility, difficulty walking, decreased ROM, decreased strength, decreased safety awareness, hypomobility, increased muscle spasms, impaired flexibility, improper body mechanics, postural dysfunction, and pain. scar restrictions   ACTIVITY LIMITATIONS  self-care, home chores, work tasks    PARTICIPATION LIMITATIONS:  community   PERSONAL FACTORS   Hysterectomy, gastric bypass, past falls onto tailbone, uneven pelvic girdle alignment ( plan to assess leg length difference next session)   are also affecting patient's functional outcome.    REHAB POTENTIAL: Good   CLINICAL DECISION MAKING: Evolving/moderate complexity   EVALUATION COMPLEXITY: Moderate    PATIENT EDUCATION:    Education details: Showed pt anatomy images. Explained muscles attachments/ connection, physiology of deep core system/ spinal- thoracic-pelvis-lower kinetic chain as they relate to pt's presentation, Sx, and past Hx. Explained what and how these areas of deficits need to be restored to balance and function    See Therapeutic activity /  neuromuscular re-education section  Answered pt's questions.   Person educated: Patient Education method: Explanation, Demonstration, Tactile cues, Verbal cues, and Handouts Education comprehension: verbalized understanding, returned demonstration, verbal cues required, tactile cues required, and needs further education     PLAN: PT FREQUENCY: 1x/week   PT DURATION: 10 weeks   PLANNED INTERVENTIONS: Therapeutic exercises, Therapeutic activity, Neuromuscular re-education, Balance training, Gait training, Patient/Family education, Self Care, Joint mobilization, Spinal mobilization, Moist heat, Taping, and Manual therapy, dry needling.   PLAN FOR NEXT SESSION: See clinical impression for plan     GOALS: Goals reviewed with patient? Yes  SHORT TERM GOALS: Target date: 04/10/2022    Pt will demo IND with HEP                    Baseline: Not IND            Goal status: MET   LONG TERM GOALS: Target date: 07/11/2022     1.Pt will demo proper deep core coordination without chest breathing and optimal excursion of diaphragm/pelvic floor in order to promote spinal stability and pelvic floor function  Baseline: dyscoordination Goal status:MET  2.  Pt will demo > 5 pt change on FOTO  to improve QOL and function   PFDI Urinary baseline - 42   Lumber baseline  - 42 pts   Goal status: INITIAL  3.  Pt will demo proper body mechanics in against gravity tasks and ADLs  work tasks, fitness  to minimize straining pelvic floor / back                  Baseline: not  IND, improper form that places strain on pelvic floor                Goal status: MET     4. Pt will demo increased gait speed > 1.3 m/s and increase steps > 15K  in order to ambulate safely in community and return to fitness routine  Baseline:  0.9 m/s decreased LLE with L trunk lean , 5 K  steps   ( 05/02/22: 1.07 m/s  no trunk lean)   Goal status:  Partially Met     5. Pt will demo levelled pelvic girdle and  shoulder height in order to progress to deep core strengthening HEP and restore mobility at spine, pelvis, gait, posture   Baseline:  Goal status: MET    6. Pt will report being able to sit for > 30 min without tailbone pain and numbness in order to participate in social activities  Baseline: pain after 10 min with numbness  ( 05/02/22: 15-20 min)  Goal status: Partially met   7. Pt will report being able to make it to the bathroom before leakage  after drinking 50-70 fl oz  Baseline: leakage before getting to bathroom  ( 05/02/22:  10% less episodes )  Goal status:  Partially Met   8. Pt will increase cervical ROM by 2-5 deg each direction to improve pain and turning to drive and look up and down to minimize falls  Baseline:   cervical ext 50 deg,  ( 05/02/22: 60 deg) flexion 55deg,     ( 05/02/22: 40 deg not met)             L rotation 30 deg     , R rotation  38 deg   ( 05/02/22: 40deg B )  Goal Status: partially met     Mariane Masters, PT 05/30/2022, 3:57 PM

## 2022-05-31 NOTE — Patient Instructions (Signed)
Deep core level 1-2   

## 2022-05-31 NOTE — Addendum Note (Signed)
Addended by: Mariane Masters on: 05/31/2022 01:39 PM   Modules accepted: Orders

## 2022-06-01 ENCOUNTER — Encounter: Payer: Commercial Managed Care - HMO | Attending: Psychology | Admitting: Psychology

## 2022-06-01 DIAGNOSIS — F4010 Social phobia, unspecified: Secondary | ICD-10-CM | POA: Insufficient documentation

## 2022-06-01 DIAGNOSIS — F4310 Post-traumatic stress disorder, unspecified: Secondary | ICD-10-CM | POA: Insufficient documentation

## 2022-06-01 DIAGNOSIS — Z72820 Sleep deprivation: Secondary | ICD-10-CM | POA: Diagnosis present

## 2022-06-01 DIAGNOSIS — F331 Major depressive disorder, recurrent, moderate: Secondary | ICD-10-CM | POA: Diagnosis present

## 2022-06-05 ENCOUNTER — Encounter: Payer: Commercial Managed Care - HMO | Admitting: Physical Therapy

## 2022-06-06 ENCOUNTER — Ambulatory Visit: Payer: Commercial Managed Care - HMO | Admitting: Physical Therapy

## 2022-06-07 ENCOUNTER — Ambulatory Visit (INDEPENDENT_AMBULATORY_CARE_PROVIDER_SITE_OTHER): Payer: Commercial Managed Care - HMO | Admitting: Licensed Clinical Social Worker

## 2022-06-07 DIAGNOSIS — F4310 Post-traumatic stress disorder, unspecified: Secondary | ICD-10-CM | POA: Diagnosis not present
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
## 2022-06-08 ENCOUNTER — Telehealth: Payer: Self-pay | Admitting: Physical Therapy

## 2022-06-08 NOTE — Telephone Encounter (Signed)
LVM letting patient know Shin-Yiing with be out of the office 05/21 and we need to try to reschedle her appointment and gave her our phone number.

## 2022-06-12 ENCOUNTER — Encounter: Payer: Commercial Managed Care - HMO | Admitting: Physical Therapy

## 2022-06-13 ENCOUNTER — Ambulatory Visit: Payer: Commercial Managed Care - HMO | Admitting: Physical Therapy

## 2022-06-18 ENCOUNTER — Other Ambulatory Visit: Payer: Self-pay | Admitting: Family Medicine

## 2022-06-19 ENCOUNTER — Other Ambulatory Visit: Payer: Self-pay | Admitting: Family Medicine

## 2022-06-19 ENCOUNTER — Other Ambulatory Visit: Payer: Self-pay

## 2022-06-20 ENCOUNTER — Other Ambulatory Visit: Payer: Self-pay

## 2022-06-20 ENCOUNTER — Ambulatory Visit: Payer: Commercial Managed Care - HMO | Admitting: Nurse Practitioner

## 2022-06-20 MED FILL — Estradiol TD Patch Twice Weekly 0.1 MG/24HR: TRANSDERMAL | 28 days supply | Qty: 8 | Fill #0 | Status: CN

## 2022-06-20 MED FILL — Estradiol TD Patch Twice Weekly 0.1 MG/24HR: TRANSDERMAL | 28 days supply | Qty: 8 | Fill #0 | Status: AC

## 2022-06-20 NOTE — Progress Notes (Deleted)
06/20/2022 [name] [id number] 29-May-1965   Chief Complaint: Medication refill   History of Present Illness: [name] is a 57 year old female with a past medical history of asthma, depression, PUD and GERD. S/P Roux-en-Y gastric bypass surgery in 2007. She had a perforation of a marginal ulcer along her gastrojejunal anastomosis requiring an exploratory laparotomy and patch repair 02/2018. She was admitted to the hospital 06/2018 with an UGI bleed/melena with anemia with a Hg level of 8.9 ->7.6 down from 10.4. EGD revealed 2 clean-based marginal ulcers.  Gastrojejunal anastomosis was friable, had contact bleeding requiring 2 clips for hemostasis. She received IV iron, no blood transfusions were required. A repeat EGD was done 10/17/2018 which showed healed ulcerations with residual clip and a very small aspect of a buried staple which were not removed. Biopsies showed evidence of reflux and  inactive chronic gastritis, no H. Pylori.      Latest Ref Rng & Units 04/07/2022    2:57 PM 11/14/2021    7:52 AM 07/19/2021   10:07 AM  CBC  WBC 3.8 - 10.8 Thousand/uL 4.8  5.0  3.5   Hemoglobin 11.7 - 15.5 g/dL 40.9  81.1  91.4   Hematocrit 35.0 - 45.0 % 40.2  38.8  36.1   Platelets 140 - 400 Thousand/uL 221  203.0  177.0        Latest Ref Rng & Units 04/07/2022    2:57 PM 11/14/2021    7:52 AM 07/19/2021   10:07 AM  CMP  Glucose 65 - 99 mg/dL 89  51  83   BUN 7 - 25 mg/dL 17  14  13    Creatinine 0.50 - 1.03 mg/dL 7.82  9.56  2.13   Sodium 135 - 146 mmol/L 142  141  142   Potassium 3.5 - 5.3 mmol/L 4.3  3.8  4.7   Chloride 98 - 110 mmol/L 107  105  109   CO2 20 - 32 mmol/L 26  29  30    Calcium 8.6 - 10.4 mg/dL 9.1  9.2  9.0   Total Protein 6.1 - 8.1 g/dL 6.4  6.5  5.9   Total Bilirubin 0.2 - 1.2 mg/dL 0.4  0.4  0.5   Alkaline Phos 39 - 117 U/L  65  78   AST 10 - 35 U/L 25  26  32   ALT 6 - 29 U/L 14  21  38      GI PROCEDURES:  EGD 03/11/2020: - Z-line irregular, biopsied.   - Normal esophagus otherwise  - Roux-en-Y gastrojejunostomy with gastrojejunal anastomosis characterized by healthy appearing mucosa, no marginal ulcers, fistula, or other concerning findings.  - Biopsies taken from normal appearing gastric pouch, rule out H pylori  - Normal examined small bowel limb.  Colonoscopy 06/02/2020: - The examined portion of the ileum was normal.  - Diverticulosis in the sigmoid colon.  - Internal hemorrhoids.  - The examination was otherwise normal. - Biopsies were taken with a cold forceps from the right colon, left colon and transverse colon for evaluation of microscopic colitis. - Recall colonoscopy 10 years   - COLONIC MUCOSA WITH NO SPECIFIC HISTOPATHOLOGIC CHANGES - NEGATIVE FOR ACUTE INFLAMMATION, INCREASED INTRAEPITHELIAL LYMPHOCYTES OR THICKENED SUBEPITHELIAL COLLAGEN TABLE  EGD 10/17/2018: Esophagogastric landmarks identified. - 1 cm hiatal hernia. - Z-line irregular. Biopsied. - Roux-en-Y gastrojejunostomy with gastrojejunal anastomosis characterized by healthy appearing mucosa - healed ulcerations with residual clip that remains and very small aspect of a  buried staple - both without ulceration. I did not remove either at this time, I'm not convinced this is causing her symptoms but can consider a repeat exam in another few months Biopsy report: 1. Surgical [P], gastric - CHRONIC INACTIVE GASTRITIS, MILD. - THERE IS NO EVIDENCE OF HELICOBACTER PYLORI, DYSPLASIA, OR MALIGNANCY. - SEE COMMENT. 2. Surgical [P], distal esophagus - GASTROESOPHAGEAL JUNCTION MUCOSA WITH MILD INFLAMMATION CONSISTENT WITH GASTROESOPHAGEAL REFLUX. - THERE IS NO EVIDENCE OF GOBLET CELL METAPLASIA, DYSPLASIA, OR MALIGNANCY.   EGD 07/11/2018: Evidence of a Roux-en-Y gastrojejunostomy was found. Pouch measured 5 cm from EGJ to anastomosis. The gastrojejunal anastomosis was characterized by friable mucosa. Two clean based ulcers on jejunal side of anastomsis, one  measuring 15mm and one measuring 6 mm. A suture was noted adjacent to the smaller ulcer. This anastomosis was traversed. Afferent and efferent limbs examined about 15 cm each and appeared normal. After withdrawal from the efferent limb there was an area on the gastric side of the anastomosis with persistent bleeding felt due to contact by the scope. 2 clips were applied with hemostasis acheived. A 3rd clip did not engage the tissue and it was removed orally with a forceps. - Two clean based marginal ulcers. - Z-line variable, at the gastroesophageal junction.  - Small hiatal hernia. - Roux-en-Y gastrojejunostomy with gastrojejunal anastomosis characterized by friable mucosa and contact bleeding requiring 2 clips for hemostasis. Biopsy report: ESOPHAGEAL SQUAMOUS AND CARDIAC MUCOSA WITH NO SPECIFIC HISTOPATHOLOGIC CHANGES - NEGATIVE FOR INTESTINAL METAPLASIA OR DYSPLASIA   Colonoscopy by Dr. Jarold Motto 04/16/2010: -Normal colonoscopy -Recall colonoscopy 10 years     Current Medications, Allergies, Past Medical History, Past Surgical History, Family History and Social History were reviewed in [employer] medical record.   Review of Systems:   Constitutional: Negative for fever, sweats, chills or weight loss.  Respiratory: Negative for shortness of breath.   Cardiovascular: Negative for chest pain, palpitations and leg swelling.  Gastrointestinal: See HPI.  Musculoskeletal: Negative for back pain or muscle aches.  Neurological: Negative for dizziness, headaches or paresthesias.    Physical Exam: There were no vitals taken for this visit. General: in no acute distress. Head: Normocephalic and atraumatic. Eyes: No scleral icterus. Conjunctiva pink . Ears: Normal auditory acuity. Mouth: Dentition intact. No ulcers or lesions.  Lungs: Clear throughout to auscultation. Heart: Regular rate and rhythm, no murmur. Abdomen: Soft, nontender and nondistended. No masses or  hepatomegaly. Normal bowel sounds x 4 quadrants.  Rectal: Deferred.  Musculoskeletal: Symmetrical with no gross deformities. Extremities: No edema. Neurological: Alert oriented x 4. No focal deficits.  Psychological: Alert and cooperative. Normal mood and affect  Assessment and Recommendations: ***

## 2022-06-22 ENCOUNTER — Other Ambulatory Visit: Payer: Self-pay | Admitting: Psychiatry

## 2022-06-22 ENCOUNTER — Encounter: Payer: Self-pay | Admitting: Psychology

## 2022-06-22 DIAGNOSIS — F4310 Post-traumatic stress disorder, unspecified: Secondary | ICD-10-CM
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
## 2022-06-23 ENCOUNTER — Other Ambulatory Visit: Payer: Self-pay | Admitting: Psychiatry

## 2022-06-23 ENCOUNTER — Other Ambulatory Visit: Payer: Self-pay

## 2022-06-23 DIAGNOSIS — F4310 Post-traumatic stress disorder, unspecified: Secondary | ICD-10-CM

## 2022-06-23 MED FILL — Zolpidem Tartrate Tab 5 MG: ORAL | 30 days supply | Qty: 30 | Fill #0 | Status: AC

## 2022-06-23 MED FILL — Zolpidem Tartrate Tab 5 MG: ORAL | 30 days supply | Qty: 30 | Fill #0 | Status: CN

## 2022-06-27 ENCOUNTER — Other Ambulatory Visit (HOSPITAL_COMMUNITY): Payer: Self-pay

## 2022-06-27 ENCOUNTER — Other Ambulatory Visit (HOSPITAL_BASED_OUTPATIENT_CLINIC_OR_DEPARTMENT_OTHER): Payer: Self-pay

## 2022-06-27 ENCOUNTER — Other Ambulatory Visit: Payer: Self-pay

## 2022-07-04 ENCOUNTER — Other Ambulatory Visit: Payer: Self-pay

## 2022-07-04 ENCOUNTER — Telehealth (INDEPENDENT_AMBULATORY_CARE_PROVIDER_SITE_OTHER): Payer: Commercial Managed Care - HMO | Admitting: Psychiatry

## 2022-07-04 ENCOUNTER — Encounter: Payer: Self-pay | Admitting: Psychiatry

## 2022-07-04 DIAGNOSIS — F4010 Social phobia, unspecified: Secondary | ICD-10-CM

## 2022-07-04 DIAGNOSIS — F4310 Post-traumatic stress disorder, unspecified: Secondary | ICD-10-CM

## 2022-07-04 DIAGNOSIS — G2581 Restless legs syndrome: Secondary | ICD-10-CM

## 2022-07-04 DIAGNOSIS — F3342 Major depressive disorder, recurrent, in full remission: Secondary | ICD-10-CM

## 2022-07-04 DIAGNOSIS — F1021 Alcohol dependence, in remission: Secondary | ICD-10-CM

## 2022-07-04 DIAGNOSIS — Z79899 Other long term (current) drug therapy: Secondary | ICD-10-CM

## 2022-07-04 MED ORDER — HYDROXYZINE PAMOATE 25 MG PO CAPS
25.0000 mg | ORAL_CAPSULE | Freq: Two times a day (BID) | ORAL | 1 refills | Status: AC | PRN
Start: 2022-07-04 — End: ?
  Filled 2022-07-04: qty 60, 30d supply, fill #0
  Filled 2022-09-02: qty 60, 30d supply, fill #1

## 2022-07-04 MED ORDER — SERTRALINE HCL 100 MG PO TABS
100.0000 mg | ORAL_TABLET | Freq: Every day | ORAL | 0 refills | Status: DC
Start: 2022-07-04 — End: 2022-09-21
  Filled 2022-07-04: qty 90, 90d supply, fill #0

## 2022-07-04 MED ORDER — BREXPIPRAZOLE 0.25 MG PO TABS
0.25 mg | ORAL_TABLET | Freq: Every day | ORAL | 1 refills | Status: AC
Start: 2022-07-04 — End: ?
  Filled 2022-07-04 – 2022-07-31 (×2): qty 30, 30d supply, fill #0

## 2022-07-04 NOTE — Progress Notes (Signed)
Virtual Visit via Video Note  I connected with [name] on 07/04/22 at  9:00 AM EDT by a video enabled telemedicine application and verified that I am speaking with the correct person using two identifiers.  Location Provider Location : [facility] Patient Location : Home  Participants: Patient , Provider    I discussed the limitations of evaluation and management by telemedicine and the availability of in person appointments. The patient expressed understanding and agreed to proceed.   I discussed the assessment and treatment plan with the patient. The patient was provided an opportunity to ask questions and all were answered. The patient agreed with the plan and demonstrated an understanding of the instructions.   The patient was advised to call back or seek an in-person evaluation if the symptoms worsen or if the condition fails to improve as anticipated.   BH MD OP Progress Note  07/04/2022 9:29 AM [name]  MRN:  [id number]  Chief Complaint:  Chief Complaint  Patient presents with   Follow-up   Depression   Anxiety   Medication Refill   HPI: [name] is a 57 year old Caucasian female, married, unemployed, applied for disability, has a history of PTSD, MDD, social anxiety disorder, RLS, alcohol use disorder in remission, chronic pain, lives in [place] and was evaluated by telemedicine today.  Patient today reports she is currently worried about the fact that her dad is going to spend 2 weeks in [place] with his friends to do elk hunting.  Patient reports she spends a lot of time taking care of her dad on a daily basis.  She is not sure what to do with her time while her dad is away.  That does worry her.  She continues to have difficulty going out in public gatherings.  She reports however therapy is beneficial.  She has learned coping strategies and is currently working on distracting herself.  She has reached out to her friends to see if she will be able to spend  some time with them.  She is also planning to do some yard work.  Patient reports overall sleep is good although she does have pain at night which does have an impact on her sleep.  She takes Ambien which helps.  Denies side effects.  Patient denies any suicidality, homicidality or perceptual disturbances.  Continues to wait for her disability hearing.  Completed appointment with Dr. Kieth Brightly, reviewed and discussed evaluation dated 06/01/2022 with patient.  Patient does not need testing at this time since her symptoms likely due to her PTSD, MDD and anxiety.  Patient to work on the same.  Patient motivated to stay in psychotherapy.  Does have upcoming appointment.  Denies any other concerns today.  Visit Diagnosis:    ICD-10-CM   1. PTSD (post-traumatic stress disorder)  F43.10 sertraline (ZOLOFT) 100 MG tablet    hydrOXYzine (VISTARIL) 25 MG capsule    brexpiprazole (REXULTI) 0.25 MG TABS tablet    2. Recurrent major depressive disorder, in full remission (HCC)  F33.42 brexpiprazole (REXULTI) 0.25 MG TABS tablet    3. Social anxiety disorder  F40.10 sertraline (ZOLOFT) 100 MG tablet    hydrOXYzine (VISTARIL) 25 MG capsule    4. RLS (restless legs syndrome)  G25.81     5. Alcohol use disorder, severe, in sustained remission (HCC)  F10.21     6. High risk medication use  Z79.899       Past Psychiatric History: I have reviewed past psychiatric history from progress note  on 04/04/2021.  Trials of medications-Wellbutrin, Abilify, Zoloft, Xanax, Cymbalta, Pristiq, BuSpar, Deplin, Lexapro, Valium, Viibryd, Trintellix, Lamictal.  Patient had psychological testing completed-05/05/2021-Dr. Rodenbough-does not meet criteria for ADHD.  Patient also had evaluation on 06/01/2022-patient to continue psychiatric treatment for her PTSD depression and sleep.  Past Medical History:  Past Medical History:  Diagnosis Date   Allergy    Anemia    Anxiety    Asthma    Depression    GERD  (gastroesophageal reflux disease)    Heart murmur    from childhood   Hiatal hernia    Hypertension    Peptic ulcer    PTSD (post-traumatic stress disorder)    UTI (urinary tract infection)     Past Surgical History:  Procedure Laterality Date   ABDOMINAL HYSTERECTOMY     fibroids   BIOPSY  07/11/2018   Procedure: BIOPSY;  Surgeon: Meryl Dare, MD;  Location[facility] ENDOSCOPY;  Service: Endoscopy;;   COLONOSCOPY  2014   ESOPHAGOGASTRODUODENOSCOPY (EGD) WITH PROPOFOL N/A 07/11/2018   Procedure: ESOPHAGOGASTRODUODENOSCOPY (EGD) WITH PROPOFOL;  Surgeon: Meryl Dare, MD;  Location: [facility] ENDOSCOPY;  Service: Endoscopy;  Laterality: N/A;   GASTRIC BYPASS  2007   HEMOSTASIS CLIP PLACEMENT  07/11/2018   Procedure: HEMOSTASIS CLIP PLACEMENT;  Surgeon: Meryl Dare, MD;  Location: [facility] ENDOSCOPY;  Service: Endoscopy;;   LAPAROTOMY N/A 03/08/2018   Procedure: EXPLORATORY LAPAROTOMY, REPAIR AND PATCH CLOSURE OF PERFORATED ULCER;  Surgeon: Violeta Gelinas, MD;  Location: [facility] OR;  Service: General;  Laterality: N/A;   TUBAL LIGATION      Family Psychiatric History: Reviewed family psychiatric history from progress note on 04/04/2021.  Family History:  Family History  Problem Relation Age of Onset   Stroke Mother    Alzheimer's disease Mother    Hypertension Father    Cancer Father        Prostate   Alcohol abuse Sister    Depression Sister    Alcohol abuse Brother    Depression Brother    Diabetes Maternal Grandfather    Liver disease Maternal Grandfather    Cancer Maternal Grandfather        Prostate   Diabetes Maternal Grandmother    Alzheimer's disease Maternal Grandmother    Diabetes Paternal Grandfather    Cancer Paternal Grandfather        Lung, Prostate   Diabetes Paternal Grandmother    Alzheimer's disease Paternal Grandmother    Colon cancer Neg Hx    Esophageal cancer Neg Hx    Pancreatic cancer Neg Hx    Stomach cancer Neg Hx    Colon polyps Neg Hx    Rectal  cancer Neg Hx     Social History: Reviewed social history from progress note on 04/04/2021. Social History   Socioeconomic History   Marital status: Married    Spouse name: mark   Number of children: 0   Years of education: Not on file   Highest education level: High school graduate  Occupational History    Employer: [employer]    Comment: [employer]  Tobacco Use   Smoking status: Former    Types: Cigarettes    Quit date: 01/24/1987    Years since quitting: 35.4   Smokeless tobacco: Never  Vaping Use   Vaping Use: Former   Substances: Flavoring   Devices: has vaped with flavor but no nicotine. Been 8 years  Substance and Sexual Activity   Alcohol use: Not Currently    Alcohol/week: 35.0  standard drinks of alcohol    Types: 35 Cans of beer per week   Drug use: No   Sexual activity: Not Currently  Other Topics Concern   Not on file  Social History Narrative   Regular exercise--no (because of knees)   Social Determinants of Health   Financial Resource Strain: Not on file  Food Insecurity: Not on file  Transportation Needs: Not on file  Physical Activity: Not on file  Stress: Not on file  Social Connections: Not on file    Allergies:  Allergies  Allergen Reactions   Lamictal [Lamotrigine] Rash   Penicillins Other (See Comments)    Did it involve swelling of the face/tongue/throat, SOB, or low BP? Unk Did it involve sudden or severe rash/hives, skin peeling, or any reaction on the inside of your mouth or nose? Unk Did you need to seek medical attention at a hospital or doctor's office? Unk When did it last happen? Was 57 years old; reaction not recalled     If all above answers are "NO", may proceed with cephalosporin use.    Codeine Itching    Crazy dreams and itching-pt said she can take it with food    Metabolic Disorder Labs: Lab Results  Component Value Date   HGBA1C 5.3 05/24/2022   MPG 105.41 05/24/2022   Lab Results  Component Value Date    PROLACTIN 16.9 05/24/2022   Lab Results  Component Value Date   CHOL 174 04/07/2022   TRIG 69 04/07/2022   HDL 66 04/07/2022   CHOLHDL 2.6 04/07/2022   VLDL 9.0 07/19/2021   LDLCALC 92 04/07/2022   LDLCALC 70 07/19/2021   Lab Results  Component Value Date   TSH 1.87 04/07/2022   TSH 3.37 11/14/2021    Therapeutic Level Labs: No results found for: "LITHIUM" No results found for: "VALPROATE" No results found for: "CBMZ"  Current Medications: Current Outpatient Medications  Medication Sig Dispense Refill   Acetaminophen (TYLENOL 8 HOUR ARTHRITIS PAIN PO) Take 2 tablets by mouth as needed.     brexpiprazole (REXULTI) 0.25 MG TABS tablet Take 1 tablet (0.25 mg total) by mouth daily. 30 tablet 1   busPIRone (BUSPAR) 15 MG tablet Take 1 tablet by mouth 2 (two) times daily. Take daily at 9 AM and at 4 PM 180 tablet 0   Cholecalciferol (VITAMIN D3) 50 MCG (2000 UT) capsule Take 1 capsule (2,000 Units total) by mouth daily. 100 capsule 0   colestipol (COLESTID) 1 g tablet Take 1 tablet (1 g total) by mouth 2 (two) times daily. Please schedule an office visit for further refills. Thank you 60 tablet 0   cyanocobalamin (VITAMIN B12) 1000 MCG/ML injection Inject 1 mL (1,000 mcg total) into the muscle every 30 (thirty) days. 10 mL 0   cyclobenzaprine (FLEXERIL) 10 MG tablet Take 1 tablet (10 mg total) by mouth 3 (three) times daily as needed. for muscle spams 30 tablet 2   Diclofenac Sodium 2 % SOLN Apply 1 pump twice daily as needed. 112 g 1   estradiol (DOTTI) 0.1 MG/24HR patch Place 1 patch (0.1 mg total) onto the skin 2 (two) times a week. 8 patch 1   estradiol (ESTRACE) 0.1 MG/GM vaginal cream Apply a pea-sized amount of cream to the fingertip and wipe in the front part of the vagina twice weekly 42.5 g 0   fluticasone (FLONASE) 50 MCG/ACT nasal spray Place 2 sprays into both nostrils daily. 16 g 5   furosemide (LASIX) 40 MG  tablet Take 1 tablet (40 mg total) by mouth daily. 90 tablet 1    Homeopathic Products (ALLERGY MEDICINE PO) Take 1 tablet by mouth as needed (Takes the Clortabs. Equate Brand).     HYDROcodone-acetaminophen (NORCO/VICODIN) 5-325 MG tablet Take 1 tablet by mouth every 6 (six) hours as needed. 15 tablet 0   hydrOXYzine (VISTARIL) 25 MG capsule Take 1 capsule (25 mg total) by mouth 2 (two) times daily as needed for anxiety. And sleep, racing thoughts 60 capsule 1   loperamide (IMODIUM) 2 MG capsule Take 2 mg by mouth daily as needed.      omeprazole (PRILOSEC) 40 MG capsule Take 1 capsule (40 mg total) by mouth 2 (two) times daily. OPEN CAPSULE AND SPRINKLE ON APPLESAUCE. 60 capsule 1   Probiotic Product (ALIGN) CHEW Chew 2 tablets by mouth daily. Align pre and probiotic with vitamain C-take one tablet each Align pre and probiotic with Vitamin b12 for immune and energy-take one tablet each     rOPINIRole (REQUIP) 1 MG tablet Take 1 tablet (1 mg total) by mouth at bedtime. 30 tablet 1   sertraline (ZOLOFT) 100 MG tablet Take 1 tablet (100 mg total) by mouth daily with breakfast. 90 tablet 0   SYRINGE-NEEDLE, DISP, 3 ML (B-D 3CC LUER-LOK SYR 25GX5/8") 25G X 5/8" 3 ML MISC USE AS DIRECTED WITH B-12 INJECTION 16 each 2   VITAMIN A PO Take by mouth.     zolpidem (AMBIEN) 5 MG tablet Take 1 tablet (5 mg total) by mouth at bedtime as needed for sleep. 30 tablet 1   No current facility-administered medications for this visit.     Musculoskeletal: Strength & Muscle Tone:  UTA Gait & Station:  Seated Patient leans: N/A  Psychiatric Specialty Exam: Review of Systems  Psychiatric/Behavioral:  The patient is nervous/anxious.     There were no vitals taken for this visit.There is no height or weight on file to calculate BMI.  General Appearance: Fairly Groomed  Eye Contact:  Fair  Speech:  Clear and Coherent  Volume:  Normal  Mood:  Anxious  Affect:  Congruent  Thought Process:  Goal Directed and Descriptions of Associations: Intact  Orientation:  Full (Time,  Place, and Person)  Thought Content: Logical   Suicidal Thoughts:  No  Homicidal Thoughts:  No  Memory:  Immediate;   Fair Recent;   Fair Remote;   Fair  Judgement:  Fair  Insight:  Fair  Psychomotor Activity:  Normal  Concentration:  Concentration: Fair and Attention Span: Fair  Recall:  [employer] of Knowledge: Fair  Language: Fair  Akathisia:  No  Handed:  Right  AIMS (if indicated): not done  Assets:  Communication Skills Desire for Improvement Housing Social Support  ADL's:  Intact  Cognition: WNL  Sleep:  Fair   Screenings: Midwife Visit from 03/03/2022 in [facility] Office Visit from 01/20/2022 in [facility] Office Visit from 11/17/2021 in [facility] Office Visit from 06/13/2021 in [facility] Office Visit from 05/25/2021 in [facility]  AIMS Total Score 0 0 0 0 0      GAD-7    Flowsheet Row Office Visit from 04/07/2022 in [facility] at [facility] Office Visit from 01/20/2022 in [facility] Office Visit from 11/17/2021 in [facility] Office Visit from 10/19/2021 in [facility] Office Visit from 09/15/2021 in [facility]  Total GAD-7 Score 18 19 20 19 19       PHQ2-9    Flowsheet Row Video Visit from 07/04/2022 in [facility] Office Visit from 04/07/2022 in [facility] Primary Care at [facility] Office Visit from 01/20/2022 in [facility] Office Visit from 11/17/2021 in [facility] Office Visit from 10/25/2021 in [facility] Primary [facility] at [facility]  PHQ-2 Total Score 0 5 4 5  0  PHQ-9 Total Score -- 19 16 21  --      Flowsheet Row Video Visit from 07/04/2022 in [facility] Counselor from 06/07/2022 in [facility] from 05/08/2022 in [facility] at [facility] RISK CATEGORY Low Risk Low Risk Low Risk        Assessment and Plan: [name] is a 57 year old Caucasian female, has a history of PTSD, MDD, social anxiety, alcohol use disorder in remission, chronic pain was evaluated by telemedicine today.  Patient is currently improving although continues to have anxiety, will benefit from the following plan.  Plan PTSD-improving Continue CBT with Ms. Christina Hussami Zoloft 100 mg p.o. daily.  Will consider increasing the dosage in the future. BuSpar 15 mg p.o. twice daily-reduced dosage. Ambien 2.5-5 mg p.o. nightly as needed  MDD in remission Rexulti 0.25 mg p.o. nightly Zoloft 100 mg p.o. daily Hydroxyzine 25 mg p.o. twice daily as needed Continue CBT with therapist, Ms. Christina Hussami  Social anxiety disorder-improving Continue CBT  RLS-improving Requip 1 mg p.o. nightly  Alcohol use disorder in remission Sober since February 2022  High risk medication use-reviewed and discussed hemoglobin A1c-05/24/2022-within normal limits-5.3, prolactin-within normal limits.    Collaboration of Care: Collaboration of Care: Other reviewed notes per Dr. Kieth Brightly dated 06/01/2022 discussed with patient as noted above.  Patient encouraged to continue CBT has upcoming appointment.  Patient/Guardian was advised Release of Information must be obtained prior to any record release in order to collaborate their care with an outside provider. Patient/Guardian was advised if they have not already done so to contact the registration department to sign all necessary forms in  order for [place] to release information regarding their care.   Consent: Patient/Guardian gives verbal consent for treatment and assignment of benefits for services provided during this visit. Patient/Guardian expressed understanding and agreed to proceed.   Follow-up in clinic in 1 month or sooner if needed.  This note was generated in part or whole with voice recognition software. Voice recognition is usually quite accurate but there are transcription errors that can and very often do occur. I apologize for any typographical errors that were not detected and corrected.    Jomarie Longs, MD 07/04/2022, 9:29 AM

## 2022-07-11 ENCOUNTER — Ambulatory Visit (INDEPENDENT_AMBULATORY_CARE_PROVIDER_SITE_OTHER): Payer: Commercial Managed Care - HMO | Admitting: Licensed Clinical Social Worker

## 2022-07-11 DIAGNOSIS — F4310 Post-traumatic stress disorder, unspecified: Secondary | ICD-10-CM
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
## 2022-07-16 ENCOUNTER — Other Ambulatory Visit: Payer: Self-pay | Admitting: Family Medicine

## 2022-07-16 ENCOUNTER — Other Ambulatory Visit: Payer: Self-pay

## 2022-07-16 DIAGNOSIS — G2581 Restless legs syndrome: Secondary | ICD-10-CM

## 2022-07-16 MED FILL — Estradiol TD Patch Twice Weekly 0.1 MG/24HR: TRANSDERMAL | 28 days supply | Qty: 8 | Fill #1 | Status: AC

## 2022-07-17 ENCOUNTER — Other Ambulatory Visit: Payer: Self-pay | Admitting: Family Medicine

## 2022-07-17 ENCOUNTER — Other Ambulatory Visit: Payer: Self-pay

## 2022-07-17 DIAGNOSIS — G2581 Restless legs syndrome: Secondary | ICD-10-CM

## 2022-07-17 MED FILL — Ropinirole Hydrochloride Tab 1 MG: ORAL | 90 days supply | Qty: 90 | Fill #0 | Status: AC

## 2022-07-19 ENCOUNTER — Other Ambulatory Visit: Payer: Self-pay

## 2022-07-19 ENCOUNTER — Other Ambulatory Visit: Payer: Self-pay | Admitting: Family Medicine

## 2022-07-19 MED ORDER — OMEPRAZOLE 40 MG PO CPDR
40.0000 mg | DELAYED_RELEASE_CAPSULE | Freq: Two times a day (BID) | ORAL | 1 refills | Status: DC
  Filled 2022-07-19: qty 60, 30d supply, fill #0
  Filled 2022-08-20: qty 60, 30d supply, fill #1

## 2022-07-23 ENCOUNTER — Other Ambulatory Visit: Payer: Self-pay | Admitting: Family Medicine

## 2022-07-23 MED FILL — Zolpidem Tartrate Tab 5 MG: ORAL | 30 days supply | Qty: 30 | Fill #1 | Status: AC

## 2022-07-24 ENCOUNTER — Other Ambulatory Visit: Payer: Self-pay

## 2022-07-24 MED ORDER — HYDROCODONE-ACETAMINOPHEN 5-325 MG PO TABS
1.0000 | ORAL_TABLET | Freq: Four times a day (QID) | ORAL | 0 refills | Status: DC | PRN
  Filled 2022-07-24: qty 15, 4d supply, fill #0

## 2022-07-24 NOTE — Telephone Encounter (Signed)
Requesting:hydrocodone 5-325mg   Contract: 08/10/21 UDS: 07/19/21 Last Visit: 04/07/22 Next Visit: None Last Refill: 05/25/22 #15 and 8GN  Please Advise

## 2022-07-31 ENCOUNTER — Telehealth: Payer: Self-pay | Admitting: Psychiatry

## 2022-07-31 ENCOUNTER — Other Ambulatory Visit: Payer: Self-pay

## 2022-07-31 DIAGNOSIS — F3342 Major depressive disorder, recurrent, in full remission: Secondary | ICD-10-CM

## 2022-07-31 DIAGNOSIS — F4310 Post-traumatic stress disorder, unspecified: Secondary | ICD-10-CM

## 2022-07-31 NOTE — Telephone Encounter (Signed)
Please assist , may need to contact pharmacy for more details.

## 2022-07-31 NOTE — Telephone Encounter (Signed)
Patient called to give her new insurance. She now has Medicaid. Pharmacy told her that the Rexulti has to be pre authorized and to let her provider be aware to start the process

## 2022-08-01 ENCOUNTER — Other Ambulatory Visit: Payer: Self-pay

## 2022-08-01 ENCOUNTER — Telehealth: Payer: Self-pay | Admitting: Psychiatry

## 2022-08-01 MED ORDER — QUETIAPINE FUMARATE 25 MG PO TABS
25.0000 mg | ORAL_TABLET | Freq: Every day | ORAL | 0 refills | Status: DC
Start: 2022-08-01 — End: 2022-08-28
  Filled 2022-08-01: qty 30, 30d supply, fill #0

## 2022-08-01 NOTE — Telephone Encounter (Signed)
Patient called stating she received notice from [email or web address] that the authorization was denied for the Rexulti and to contact her provider.

## 2022-08-01 NOTE — Telephone Encounter (Signed)
noted 

## 2022-08-01 NOTE — Telephone Encounter (Signed)
prior auth for the rexulti was denied.

## 2022-08-01 NOTE — Telephone Encounter (Signed)
[name] do we have an update on this ? If they have denied it, please let patient know to schedule an appointment for medication changes. Please also check with her as to how much supply she has left.

## 2022-08-01 NOTE — Telephone Encounter (Signed)
rexulti prior Berkley Harvey was denied. denied. preferred medications is aripiprazole, latuda, olanazapine, paliperidone er, quetiapine

## 2022-08-01 NOTE — Telephone Encounter (Signed)
Contacted patient to discuss Rexulti not covered, patient agreeable to trial of Seroquel.  She has never tried it before.  Provided medication education.  Advised to hold the zolpidem since Seroquel can also help with sleep.  Patient with history of restless leg symptoms advised to monitor for any worsening restless leg symptoms while on this medication as well as weight gain.  Start Seroquel 25 mg p.o. at bedtime.  Patient encouraged to keep her appointment on July 29 for further medication changes as needed.

## 2022-08-01 NOTE — Telephone Encounter (Signed)
went online to [email or web address] and submitted the prior auth . - pending 

## 2022-08-02 ENCOUNTER — Other Ambulatory Visit: Payer: Self-pay

## 2022-08-04 ENCOUNTER — Encounter: Payer: Self-pay | Admitting: Family Medicine

## 2022-08-04 ENCOUNTER — Ambulatory Visit (INDEPENDENT_AMBULATORY_CARE_PROVIDER_SITE_OTHER): Payer: Medicaid Other | Admitting: Family Medicine

## 2022-08-04 VITALS — BP 110/84 | HR 63 | Temp 98.5°F | Resp 18 | Ht 62.0 in | Wt 156.2 lb

## 2022-08-04 DIAGNOSIS — M4802 Spinal stenosis, cervical region: Secondary | ICD-10-CM

## 2022-08-04 DIAGNOSIS — R42 Dizziness and giddiness: Secondary | ICD-10-CM

## 2022-08-04 LAB — CBC WITH DIFFERENTIAL/PLATELET
Basophils Absolute: 62 cells/uL (ref 0–200)
Basophils Relative: 1.1 %
Hemoglobin: 13.8 g/dL (ref 11.7–15.5)
Lymphs Abs: 2061 cells/uL (ref 850–3900)
Monocytes Relative: 7 %
Neutrophils Relative %: 49.5 %
Platelets: 231 10*3/uL (ref 140–400)
RBC: 4.46 10*6/uL (ref 3.80–5.10)
Total Lymphocyte: 36.8 %
WBC: 5.6 10*3/uL (ref 3.8–10.8)

## 2022-08-04 NOTE — Progress Notes (Signed)
Established Patient Office Visit  Subjective   Patient ID: [name], female    DOB: [birth year]  Age: 57 y.o. MRN: [id number]  Chief Complaint  Patient presents with   Dizziness    Pt states off and on with head pain. No nausea or chest pain. Pt states having tongue tingling with eating    HPI Discussed the use of AI scribe software for clinical note transcription with the patient, who gave verbal consent to proceed.  History of Present Illness   The patient presents with a tingling tongue and dizziness, described as feeling 'sweaty headed.' They deny recent illness, palpitations, shortness of breath, and chest pain. They report needing to 'pop' their ears frequently, especially when riding in a car. They are currently using Flonase and over-the-counter chlorotrimetab for allergies.  The patient also reports chronic pain related to arthritis in their neck, which they believe may be contributing to their symptoms. They describe the pain as constant and located in the neck and across the forehead. They have previously received injections for this pain. They have been denied disability despite their chronic pain and are currently appealing this decision.  In addition, the patient mentions a history of iron deficiency and is currently on Medicaid. They are not taking more pain medication than usual.      Patient Active Problem List   Diagnosis Date Noted   Cervical stenosis of spine 08/04/2022   At risk for prolonged QT interval syndrome 10/19/2021   PTSD (post-traumatic stress disorder) 04/04/2021   MDD (major depressive disorder), recurrent episode, moderate (HCC) 04/04/2021   Social anxiety disorder 04/04/2021   Dizziness 11/04/2020   Epistaxis 11/04/2020   Chest pain of uncertain etiology 11/04/2020   Anxiety 09/23/2020   Attention and concentration deficit 09/23/2020   Degenerative spondylolisthesis 04/05/2020   Low back pain with radiation 02/06/2020   Bilateral lower  extremity edema 04/07/2019   Pain in both lower extremities 04/07/2019   Numbness in both hands 04/07/2019   Pain in right arm 04/07/2019   Recurrent urinary tract infection 10/24/2018   Menopausal syndrome 10/24/2018   Abnormal mammogram 10/24/2018   Abdominal pain, epigastric    Marginal ulcers    Melena 07/10/2018   Alcohol use disorder, severe, in sustained remission (HCC) 07/10/2018   Encounter for screening for HIV 07/10/2018   Asthma 07/10/2018   Exposure to COVID-19 virus 05/28/2018   GERD (gastroesophageal reflux disease) 05/28/2018   Free intraperitoneal air 03/08/2018   Perforated ulcer (HCC) 03/08/2018   Cervical radiculopathy at C7 02/22/2018   Essential hypertension 08/02/2017   Hematoma of hip, left, initial encounter 06/28/2017   Neck pain 06/28/2017   Nonallopathic lesion of cervical region 06/28/2017   Nonallopathic lesion of thoracic region 06/28/2017   Nonallopathic lesion of lumbar region 06/28/2017   Biomechanical lesion, unspecified 06/28/2017   Muscle spasm 04/20/2017   URI (upper respiratory infection) 10/02/2016   Preventative health care 05/01/2016   Degenerative arthritis of knee, bilateral 06/02/2015   Patellofemoral arthritis of left knee 05/04/2015   Cough 09/02/2013   Acute pharyngitis 09/02/2013   Sinusitis 06/25/2013   Dog bite of face 04/16/2013   Gastroenteritis 04/16/2013   Allergic rhinitis 04/16/2013   Mass of soft tissue 08/02/2012   RLS (restless legs syndrome) 01/21/2009   LYMPHOPENIA 03/23/2008   Depression 02/27/2008   HEMANGIOMA, SKIN 02/04/2008   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 02/19/2007   Past Medical History:  Diagnosis Date   Allergy  Anemia    Anxiety    Asthma    Depression    GERD (gastroesophageal reflux disease)    Heart murmur    from childhood   Hiatal hernia    Hypertension    Peptic ulcer    PTSD (post-traumatic stress disorder)    UTI (urinary tract infection)    Past Surgical History:   Procedure Laterality Date   ABDOMINAL HYSTERECTOMY     fibroids   BIOPSY  07/11/2018   Procedure: BIOPSY;  Surgeon: Meryl Dare, MD;  Location: [facility] ENDOSCOPY;  Service: Endoscopy;;   COLONOSCOPY  2014   ESOPHAGOGASTRODUODENOSCOPY (EGD) WITH PROPOFOL N/A 07/11/2018   Procedure: ESOPHAGOGASTRODUODENOSCOPY (EGD) WITH PROPOFOL;  Surgeon: Meryl Dare, MD;  Location: [facility] ENDOSCOPY;  Service: Endoscopy;  Laterality: N/A;   GASTRIC BYPASS  2007   HEMOSTASIS CLIP PLACEMENT  07/11/2018   Procedure: HEMOSTASIS CLIP PLACEMENT;  Surgeon: Meryl Dare, MD;  Location: [facility] ENDOSCOPY;  Service: Endoscopy;;   LAPAROTOMY N/A 03/08/2018   Procedure: EXPLORATORY LAPAROTOMY, REPAIR AND PATCH CLOSURE OF PERFORATED ULCER;  Surgeon: Violeta Gelinas, MD;  Location: [facility] OR;  Service: General;  Laterality: N/A;   TUBAL LIGATION     Social History   Tobacco Use   Smoking status: Former    Current packs/day: 0.00    Types: Cigarettes    Quit date: 01/24/1987    Years since quitting: 35.5   Smokeless tobacco: Never  Vaping Use   Vaping status: Former   Substances: Flavoring   Devices: has vaped with flavor but no nicotine. Been 8 years  Substance Use Topics   Alcohol use: Not Currently    Alcohol/week: 35.0 standard drinks of alcohol    Types: 35 Cans of beer per week   Drug use: No   Social History   Socioeconomic History   Marital status: Married    Spouse name: mark   Number of children: 0   Years of education: Not on file   Highest education level: High school graduate  Occupational History    Employer: [employer]    Comment: [employer]  Tobacco Use   Smoking status: Former    Current packs/day: 0.00    Types: Cigarettes    Quit date: 01/24/1987    Years since quitting: 35.5   Smokeless tobacco: Never  Vaping Use   Vaping status: Former   Substances: Flavoring   Devices: has vaped with flavor but no nicotine. Been 8 years  Substance and Sexual Activity   Alcohol use: Not  Currently    Alcohol/week: 35.0 standard drinks of alcohol    Types: 35 Cans of beer per week   Drug use: No   Sexual activity: Not Currently  Other Topics Concern   Not on file  Social History Narrative   Regular exercise--no (because of knees)   Social Determinants of Health   Financial Resource Strain: Not on file  Food Insecurity: Not on file  Transportation Needs: Not on file  Physical Activity: Not on file  Stress: Not on file  Social Connections: Not on file  Intimate Partner Violence: Not on file   Family Status  Relation Name Status   Mother  Deceased   Father  Alive   Sister  Alive   Brother  Alive   MGF  (Not Specified)   MGM  (Not Specified)   PGF  (Not Specified)   PGM  (Not Specified)   Neg Hx  (Not Specified)  No partnership data on file  Family History  Problem Relation Age of Onset   Stroke Mother    Alzheimer's disease Mother    Hypertension Father    Cancer Father        Prostate   Alcohol abuse Sister    Depression Sister    Alcohol abuse Brother    Depression Brother    Diabetes Maternal Grandfather    Liver disease Maternal Grandfather    Cancer Maternal Grandfather        Prostate   Diabetes Maternal Grandmother    Alzheimer's disease Maternal Grandmother    Diabetes Paternal Grandfather    Cancer Paternal Grandfather        Lung, Prostate   Diabetes Paternal Grandmother    Alzheimer's disease Paternal Grandmother    Colon cancer Neg Hx    Esophageal cancer Neg Hx    Pancreatic cancer Neg Hx    Stomach cancer Neg Hx    Colon polyps Neg Hx    Rectal cancer Neg Hx    Allergies  Allergen Reactions   Lamictal [Lamotrigine] Rash   Penicillins Other (See Comments)    Did it involve swelling of the face/tongue/throat, SOB, or low BP? Unk Did it involve sudden or severe rash/hives, skin peeling, or any reaction on the inside of your mouth or nose? Unk Did you need to seek medical attention at a hospital or doctor's office? Unk When  did it last happen? Was 57 years old; reaction not recalled     If all above answers are "NO", may proceed with cephalosporin use.    Codeine Itching    Crazy dreams and itching-pt said she can take it with food      Review of Systems  Constitutional:  Negative for chills, fever and malaise/fatigue.  HENT:  Negative for congestion and hearing loss.   Eyes:  Negative for blurred vision and discharge.  Respiratory:  Negative for cough, sputum production and shortness of breath.   Cardiovascular:  Negative for chest pain, palpitations and leg swelling.  Gastrointestinal:  Negative for abdominal pain, blood in stool, constipation, diarrhea, heartburn, nausea and vomiting.  Genitourinary:  Negative for dysuria, frequency, hematuria and urgency.  Musculoskeletal:  Negative for back pain, falls and myalgias.  Skin:  Negative for rash.  Neurological:  Negative for dizziness, sensory change, loss of consciousness, weakness and headaches.  Endo/Heme/Allergies:  Negative for environmental allergies. Does not bruise/bleed easily.  Psychiatric/Behavioral:  Negative for depression and suicidal ideas. The patient is not nervous/anxious and does not have insomnia.       Objective:     BP 110/84 (BP Location: Left Arm, Patient Position: Sitting, Cuff Size: Normal)   Pulse 63   Temp 98.5 F (36.9 C) (Oral)   Resp 18   Ht 5\' 2"  (1.575 m)   Wt 156 lb 3.2 oz (70.9 kg)   SpO2 100%   BMI 28.57 kg/m  BP Readings from Last 3 Encounters:  08/04/22 110/84  04/07/22 100/78  03/10/22 99/66   Wt Readings from Last 3 Encounters:  08/04/22 156 lb 3.2 oz (70.9 kg)  04/07/22 159 lb (72.1 kg)  03/10/22 160 lb (72.6 kg)   SpO2 Readings from Last 3 Encounters:  08/04/22 100%  04/07/22 99%  12/14/21 99%      Physical Exam Vitals and nursing note reviewed.  Constitutional:      General: She is not in acute distress.    Appearance: Normal appearance. She is well-developed.  HENT:     Head:  Normocephalic and atraumatic.     Right Ear: Tympanic membrane, ear canal and external ear normal.     Left Ear: Tympanic membrane, ear canal and external ear normal.  Eyes:     General: No scleral icterus.       Right eye: No discharge.        Left eye: No discharge.     Extraocular Movements: Extraocular movements intact.     Pupils: Pupils are equal, round, and reactive to light.  Cardiovascular:     Rate and Rhythm: Normal rate and regular rhythm.     Heart sounds: No murmur heard. Pulmonary:     Effort: Pulmonary effort is normal. No respiratory distress.     Breath sounds: Normal breath sounds.  Abdominal:     General: Abdomen is flat.     Palpations: Abdomen is soft.  Musculoskeletal:        General: Normal range of motion.     Cervical back: Normal range of motion and neck supple.     Right lower leg: No edema.     Left lower leg: No edema.  Skin:    General: Skin is warm and dry.  Neurological:     Mental Status: She is alert and oriented to person, place, and time.  Psychiatric:        Mood and Affect: Mood normal.        Behavior: Behavior normal.        Thought Content: Thought content normal.        Judgment: Judgment normal.      No results found for any visits on 08/04/22.  Last CBC Lab Results  Component Value Date   WBC 4.8 04/07/2022   HGB 13.2 04/07/2022   HCT 40.2 04/07/2022   MCV 84.8 04/07/2022   MCH 27.8 04/07/2022   RDW 14.3 04/07/2022   PLT 221 04/07/2022   Last metabolic panel Lab Results  Component Value Date   GLUCOSE 89 04/07/2022   NA 142 04/07/2022   K 4.3 04/07/2022   CL 107 04/07/2022   CO2 26 04/07/2022   BUN 17 04/07/2022   CREATININE 0.76 04/07/2022   GFR 76.49 11/14/2021   CALCIUM 9.1 04/07/2022   PHOS 3.8 01/29/2009   PROT 6.4 04/07/2022   ALBUMIN 4.0 11/14/2021   LABGLOB 2.1 08/27/2018   AGRATIO 1.9 08/27/2018   BILITOT 0.4 04/07/2022   ALKPHOS 65 11/14/2021   AST 25 04/07/2022   ALT 14 04/07/2022   ANIONGAP  6 07/12/2018   Last lipids Lab Results  Component Value Date   CHOL 174 04/07/2022   HDL 66 04/07/2022   LDLCALC 92 04/07/2022   TRIG 69 04/07/2022   CHOLHDL 2.6 04/07/2022   Last hemoglobin A1c Lab Results  Component Value Date   HGBA1C 5.3 05/24/2022   Last thyroid functions Lab Results  Component Value Date   TSH 1.87 04/07/2022   Last vitamin D Lab Results  Component Value Date   VD25OH 52.80 11/14/2021   Last vitamin B12 and Folate Lab Results  Component Value Date   VITAMINB12 868 11/14/2021   FOLATE >23.7 04/07/2019      The 10-year ASCVD risk score (Arnett DK, et al., 2019) is: 1.8%    Assessment & Plan:   Problem List Items Addressed This Visit       Unprioritized   Dizziness - Primary    Exam normal  Try epley maneuver  ? From cervical stenosis  Relevant Orders   CBC with Differential/Platelet   Comprehensive metabolic panel   TSH   Vitamin B12   Cervical stenosis of spine   Relevant Orders   MR Cervical Spine Wo Contrast    No follow-ups on file.    Donato Schultz, DO

## 2022-08-04 NOTE — Patient Instructions (Signed)
How to Perform the Epley Maneuver The Epley maneuver is an exercise that relieves symptoms of vertigo. Vertigo is the feeling that you or your surroundings are moving when they are not. When you feel vertigo, you may feel like the room is spinning and may have trouble walking. The Epley maneuver is used for a type of vertigo caused by a calcium deposit in a part of the inner ear. The maneuver involves changing head positions to help the deposit move out of the area. You can do this maneuver at home whenever you have symptoms of vertigo. You can repeat it in 24 hours if your vertigo has not gone away. Even though the Epley maneuver may relieve your vertigo for a few weeks, it is possible that your symptoms will return. This maneuver relieves vertigo, but it does not relieve dizziness. What are the risks? If it is done correctly, the Epley maneuver is considered safe. Sometimes it can lead to dizziness or nausea that goes away after a short time. If you develop other symptoms--such as changes in vision, weakness, or numbness--stop doing the maneuver and call your health care provider. Supplies needed: A bed or table. A pillow. How to do the Epley maneuver     Sit on the edge of a bed or table with your back straight and your legs extended or hanging over the edge of the bed or table. Turn your head halfway toward the affected ear or side as told by your health care provider. Lie backward quickly with your head turned until you are lying flat on your back. Your head should dangle (head-hanging position). You may want to position a pillow under your shoulders. Hold this position for at least 30 seconds. If you feel dizzy or have symptoms of vertigo, continue to hold the position until the symptoms stop. Turn your head to the opposite direction until your unaffected ear is facing down. Your head should continue to dangle. Hold this position for at least 30 seconds. If you feel dizzy or have symptoms of  vertigo, continue to hold the position until the symptoms stop. Turn your whole body to the same side as your head so that you are positioned on your side. Your head will now be nearly facedown and no longer needs to dangle. Hold for at least 30 seconds. If you feel dizzy or have symptoms of vertigo, continue to hold the position until the symptoms stop. Sit back up. You can repeat the maneuver in 24 hours if your vertigo does not go away. Follow these instructions at home: For 24 hours after doing the Epley maneuver: Keep your head in an upright position. When lying down to sleep or rest, keep your head raised (elevated) with two or more pillows. Avoid excessive neck movements. Activity Do not drive or use machinery if you feel dizzy. After doing the Epley maneuver, return to your normal activities as told by your health care provider. Ask your health care provider what activities are safe for you. General instructions Drink enough fluid to keep your urine pale yellow. Do not drink alcohol. Take over-the-counter and prescription medicines only as told by your health care provider. Keep all follow-up visits. This is important. Preventing vertigo symptoms Ask your health care provider if there is anything you should do at home to prevent vertigo. He or she may recommend that you: Keep your head elevated with two or more pillows while you sleep. Do not sleep on the side of your affected ear. Get   up slowly from bed. Avoid sudden movements during the day. Avoid extreme head positions or movement, such as looking up or bending over. Contact a health care provider if: Your vertigo gets worse. You have other symptoms, including: Nausea. Vomiting. Headache. Get help right away if you: Have vision changes. Have a headache or neck pain that is severe or getting worse. Cannot stop vomiting. Have new numbness or weakness in any part of your body. These symptoms may represent a serious problem  that is an emergency. Do not wait to see if the symptoms will go away. Get medical help right away. Call your local emergency services (911 in the [place] Do not drive yourself to the hospital. Summary Vertigo is the feeling that you or your surroundings are moving when they are not. The Epley maneuver is an exercise that relieves symptoms of vertigo. If the Epley maneuver is done correctly, it is considered safe. This information is not intended to replace advice given to you by your health care provider. Make sure you discuss any questions you have with your health care provider. Document Revised: 12/10/2019 Document Reviewed: 12/10/2019 Elsevier Patient Education  2024 [employer]  

## 2022-08-04 NOTE — Assessment & Plan Note (Signed)
Exam normal  Try epley maneuver  ? From cervical stenosis

## 2022-08-05 LAB — COMPREHENSIVE METABOLIC PANEL
AG Ratio: 1.9 (calc) (ref 1.0–2.5)
ALT: 26 U/L (ref 6–29)
AST: 29 U/L (ref 10–35)
Albumin: 4.3 g/dL (ref 3.6–5.1)
Alkaline phosphatase (APISO): 81 U/L (ref 37–153)
BUN: 12 mg/dL (ref 7–25)
CO2: 28 mmol/L (ref 20–32)
Calcium: 9.4 mg/dL (ref 8.6–10.4)
Chloride: 107 mmol/L (ref 98–110)
Creat: 0.83 mg/dL (ref 0.50–1.03)
Globulin: 2.3 g/dL (calc) (ref 1.9–3.7)
Glucose, Bld: 90 mg/dL (ref 65–99)
Potassium: 4.2 mmol/L (ref 3.5–5.3)
Sodium: 142 mmol/L (ref 135–146)
Total Bilirubin: 0.4 mg/dL (ref 0.2–1.2)
Total Protein: 6.6 g/dL (ref 6.1–8.1)

## 2022-08-05 LAB — CBC WITH DIFFERENTIAL/PLATELET
Absolute Monocytes: 392 cells/uL (ref 200–950)
Eosinophils Absolute: 314 cells/uL (ref 15–500)
Eosinophils Relative: 5.6 %
HCT: 40.9 % (ref 35.0–45.0)
MCH: 30.9 pg (ref 27.0–33.0)
MCHC: 33.7 g/dL (ref 32.0–36.0)
MCV: 91.7 fL (ref 80.0–100.0)
MPV: 10.3 fL (ref 7.5–12.5)
Neutro Abs: 2772 cells/uL (ref 1500–7800)
RDW: 12.9 % (ref 11.0–15.0)

## 2022-08-05 LAB — TSH: TSH: 3.15 mIU/L (ref 0.40–4.50)

## 2022-08-05 LAB — VITAMIN B12: Vitamin B-12: 755 pg/mL (ref 200–1100)

## 2022-08-17 ENCOUNTER — Other Ambulatory Visit: Payer: Self-pay | Admitting: Family Medicine

## 2022-08-17 DIAGNOSIS — M4802 Spinal stenosis, cervical region: Secondary | ICD-10-CM

## 2022-08-17 NOTE — Progress Notes (Signed)
Rubin Payor, PhD, LAT, ATC acting as a scribe for Clementeen Graham, MD.  [name] is a 57 y.o. female who presents to [facility] today for exacerbation of her R knee pain. Pt was last seen by Dr. Denyse Amass on 12/14/21 and was advised to cont working on quad strengthening. Last R knee steroid injection was on 10/25/21.  Today, pt reports she has suffered several falls. 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 Pt locates pain to the posterior aspect, feeling of "pulling." Pain is significant sometimes that it's disturbing her sleep.  She notes the falls that primarily occurred because of dizziness.  She notes dizziness especially when she looks up.  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  Dx imaging: 10/25/21 R knee XR   Pertinent review of systems: No fevers or chills  Relevant historical information: Hypertension   Exam:  BP 102/70   Pulse 65   Ht 5\' 2"  (1.575 m)   Wt 159 lb (72.1 kg)   SpO2 100%   BMI 29.08 kg/m  General: Well Developed, well nourished, and in no acute distress.   MSK: Right knee: Mild effusion otherwise normal-appearing. Normal knee motion. Mildly tender palpation anterior knee. Stable ligamentous exam. Intact strength.    Lab and Radiology Results  Procedure: Real-time Ultrasound Guided Injection of right knee joint superior lateral patella space Device: Philips Affiniti 50G/GE Logiq Images permanently stored and available for review in PACS Verbal informed consent obtained.  Discussed risks and benefits of procedure. Warned about infection, bleeding, hyperglycemia damage to structures among others. Patient expresses understanding and agreement Time-out conducted.   Noted no overlying erythema, induration, or other signs of local infection.   Skin prepped in a sterile fashion.   Local anesthesia: Topical Ethyl chloride.   With sterile technique and under real time ultrasound  guidance: 40 mg of Kenalog and 2 mL of Marcaine injected into knee joint. Fluid seen entering the joint capsule.   Completed without difficulty   Pain immediately resolved suggesting accurate placement of the medication.   Advised to call if fevers/chills, erythema, induration, drainage, or persistent bleeding.   Images permanently stored and available for review in the ultrasound unit.  Impression: Technically successful ultrasound guided injection.    X-ray images right knee obtained today personally and independently interpreted Mild medial and lateral DJD.  No acute fractures are visible. Await formal radiology review    Assessment and Plan: 57 y.o. female with right knee pain after fall thought to be due to exacerbation of DJD.  Plan for steroid injection today.  Continued home exercise program and Voltaren gel.  We talked about the underlying reasons for her falls which I think is her dizziness.  I think vestibular therapy would be a good idea.  She would like to wait until she has her upcoming cervical spine MRI which is reasonable.   PDMP not reviewed this encounter. Orders Placed This Encounter  Procedures   [place] LIMITED JOINT SPACE STRUCTURES LOW RIGHT(NO LINKED CHARGES)    Order Specific Question:   Reason for Exam (SYMPTOM  OR DIAGNOSIS REQUIRED)    Answer:   right knee pain    Order Specific Question:   Preferred imaging location?    Answer:   [facility] Knee AP/LAT W/Sunrise Right    Standing Status:   Future    Number of Occurrences:   1    Standing  Expiration Date:   09/18/2022    Order Specific Question:   Reason for Exam (SYMPTOM  OR DIAGNOSIS REQUIRED)    Answer:   right knee pain    Order Specific Question:   Preferred imaging location?    Answer:   [facility]    Order Specific Question:   Is patient pregnant?    Answer:   No   No orders of the defined types were placed in this encounter.    Discussed warning signs  or symptoms. Please see discharge instructions. Patient expresses understanding.   The above documentation has been reviewed and is accurate and complete Clementeen Graham, M.D.

## 2022-08-18 ENCOUNTER — Other Ambulatory Visit: Payer: Self-pay

## 2022-08-18 ENCOUNTER — Ambulatory Visit (INDEPENDENT_AMBULATORY_CARE_PROVIDER_SITE_OTHER): Payer: Medicaid Other

## 2022-08-18 ENCOUNTER — Ambulatory Visit (INDEPENDENT_AMBULATORY_CARE_PROVIDER_SITE_OTHER): Payer: Medicaid Other | Admitting: Family Medicine

## 2022-08-18 VITALS — BP 102/70 | HR 65 | Ht 62.0 in | Wt 159.0 lb

## 2022-08-18 DIAGNOSIS — R42 Dizziness and giddiness: Secondary | ICD-10-CM | POA: Diagnosis not present

## 2022-08-18 DIAGNOSIS — G8929 Other chronic pain: Secondary | ICD-10-CM

## 2022-08-18 DIAGNOSIS — M25561 Pain in right knee: Secondary | ICD-10-CM

## 2022-08-18 NOTE — Patient Instructions (Signed)
Thank you for coming in today.   Please get an Xray today before you leave   You received an injection today. Seek immediate medical attention if the joint becomes red, extremely painful, or is oozing fluid.   If not better in a week, let me know, and we can try to aspirate and inject the cyst in the back of your knee.  Consider vestibular therapy for dizziness.  Check back as needed

## 2022-08-20 ENCOUNTER — Other Ambulatory Visit: Payer: Self-pay | Admitting: Family Medicine

## 2022-08-20 DIAGNOSIS — M79604 Pain in right leg: Secondary | ICD-10-CM

## 2022-08-21 ENCOUNTER — Telehealth (INDEPENDENT_AMBULATORY_CARE_PROVIDER_SITE_OTHER): Payer: Medicaid Other | Admitting: Psychiatry

## 2022-08-21 ENCOUNTER — Other Ambulatory Visit: Payer: Self-pay

## 2022-08-21 ENCOUNTER — Encounter: Payer: Self-pay | Admitting: Psychiatry

## 2022-08-21 DIAGNOSIS — G2581 Restless legs syndrome: Secondary | ICD-10-CM | POA: Diagnosis not present

## 2022-08-21 DIAGNOSIS — F4010 Social phobia, unspecified: Secondary | ICD-10-CM

## 2022-08-21 DIAGNOSIS — F3342 Major depressive disorder, recurrent, in full remission: Secondary | ICD-10-CM | POA: Diagnosis not present

## 2022-08-21 DIAGNOSIS — F1021 Alcohol dependence, in remission: Secondary | ICD-10-CM

## 2022-08-21 DIAGNOSIS — F4310 Post-traumatic stress disorder, unspecified: Secondary | ICD-10-CM

## 2022-08-21 MED ORDER — ESTRADIOL 0.1 MG/24HR TD PTTW
1.0000 | MEDICATED_PATCH | TRANSDERMAL | 1 refills | Status: DC
  Filled 2022-08-21: qty 8, 28d supply, fill #0
  Filled 2022-09-27: qty 8, 28d supply, fill #1

## 2022-08-21 MED ORDER — FUROSEMIDE 40 MG PO TABS
40.0000 mg | ORAL_TABLET | Freq: Every day | ORAL | 1 refills | Status: DC
Start: 2022-08-21 — End: 2023-02-20
  Filled 2022-08-21: qty 90, 90d supply, fill #0
  Filled 2022-11-22: qty 90, 90d supply, fill #1

## 2022-08-21 NOTE — Progress Notes (Unsigned)
Virtual Visit via Video Note  I connected with [name] on 08/21/22 at  4:30 PM EDT by a video enabled telemedicine application and verified that I am speaking with the correct person using two identifiers.  Location Provider Location : [facility] Patient Location : Home  Participants: Patient , Provider   I discussed the limitations of evaluation and management by telemedicine and the availability of in person appointments. The patient expressed understanding and agreed to proceed.    I discussed the assessment and treatment plan with the patient. The patient was provided an opportunity to ask questions and all were answered. The patient agreed with the plan and demonstrated an understanding of the instructions.   The patient was advised to call back or seek an in-person evaluation if the symptoms worsen or if the condition fails to improve as anticipated.  BH MD OP Progress Note  08/21/2022 4:48 PM Kavita Sarge  MRN:  [id number]  Chief Complaint:  Chief Complaint  Patient presents with   Follow-up   Depression   Anxiety   Medication Refill   HPI: [name] is a 57 year old Caucasian female, married, unemployed, has a history of PTSD, MDD, social anxiety disorder, RLS, alcohol use disorder in remission, chronic pain, lives in [place], was evaluated by telemedicine today.  Patient today reports she was denied disability recently.  She is currently started the process to appeal that decision.  That does make her anxious.  She also has her moments when she feels sad about the situation.  She however reports overall she feels she has been handling it well.  She is currently in a lot of pain of her right sided knee.  Had cortisone injection to her knee last Friday.  That has not helped much.  Patient reports she continues to struggle with her sleep.  Pain also likely affecting her sleep.  She is currently on Seroquel 25 mg at bed time since Rexulti was not approved by her  health insurance.  She however reports she recently received a letter which says otherwise and will bring it to the office tomorrow.  She is not sure if the letter is an approval letter for Rexulti or not.  She does have mild dizziness which has been ongoing since the past few days, constant, does not change with changing position.  She is scheduled to get an MRI of her brain as well as an appointment with her provider for evaluation.  She would like to hold off dosage change of Seroquel until her evaluation for her dizziness.  She is also aware pain affecting her sleep and hence will need pain management.  Patient reports initially when she got the decision regarding her disability application she was sad and had passive thoughts of what is the point of this existence although she did not have any active suicidality.  She currently denies any suicidality, homicidality or perceptual disturbances.  Patient reports her dad is currently back from [place] and she continues to spend a lot of time with him.  While he was gone she was able to spend time with her friends and family and that kept her busy.  Patient is compliant on all her medications.  Patient denies any other concerns today.  Visit Diagnosis:    ICD-10-CM   1. PTSD (post-traumatic stress disorder)  F43.10     2. Recurrent major depressive disorder, in full remission (HCC)  F33.42     3. Social anxiety disorder  F40.10  4. RLS (restless legs syndrome)  G25.81     5. Alcohol use disorder, severe, in sustained remission (HCC)  F10.21       Past Psychiatric History: I have reviewed past psychiatric history from progress note on 04/04/2021.  Trials of medications-Wellbutrin, Abilify, Zoloft, Xanax, Cymbalta, Pristiq, BuSpar, Deplin, Lexapro, Valium, Viibryd, Trintellix, Lamictal.  Patient had psychological testing completed-05/05/2021-Dr. Rodenbough-does not meet criteria for ADHD.  Patient also had evaluation on 06/01/2022-patient to  continue psychiatric treatment for her PTSD, depression, sleep.  Past Medical History:  Past Medical History:  Diagnosis Date   Allergy    Anemia    Anxiety    Asthma    Depression    GERD (gastroesophageal reflux disease)    Heart murmur    from childhood   Hiatal hernia    Hypertension    Peptic ulcer    PTSD (post-traumatic stress disorder)    UTI (urinary tract infection)     Past Surgical History:  Procedure Laterality Date   ABDOMINAL HYSTERECTOMY     fibroids   BIOPSY  07/11/2018   Procedure: BIOPSY;  Surgeon: Meryl Dare, MD;  Location: [facility] ENDOSCOPY;  Service: Endoscopy;;   COLONOSCOPY  2014   ESOPHAGOGASTRODUODENOSCOPY (EGD) WITH PROPOFOL N/A 07/11/2018   Procedure: ESOPHAGOGASTRODUODENOSCOPY (EGD) WITH PROPOFOL;  Surgeon: Meryl Dare, MD;  Location: [facility] ENDOSCOPY;  Service: Endoscopy;  Laterality: N/A;   GASTRIC BYPASS  2007   HEMOSTASIS CLIP PLACEMENT  07/11/2018   Procedure: HEMOSTASIS CLIP PLACEMENT;  Surgeon: Meryl Dare, MD;  Location: [facility] ENDOSCOPY;  Service: Endoscopy;;   LAPAROTOMY N/A 03/08/2018   Procedure: EXPLORATORY LAPAROTOMY, REPAIR AND PATCH CLOSURE OF PERFORATED ULCER;  Surgeon: Violeta Gelinas, MD;  Location: [facility] OR;  Service: General;  Laterality: N/A;   TUBAL LIGATION      Family Psychiatric History: I have reviewed family psychiatric history from progress note on 04/04/2021.  Family History:  Family History  Problem Relation Age of Onset   Stroke Mother    Alzheimer's disease Mother    Hypertension Father    Cancer Father        Prostate   Alcohol abuse Sister    Depression Sister    Alcohol abuse Brother    Depression Brother    Diabetes Maternal Grandfather    Liver disease Maternal Grandfather    Cancer Maternal Grandfather        Prostate   Diabetes Maternal Grandmother    Alzheimer's disease Maternal Grandmother    Diabetes Paternal Grandfather    Cancer Paternal Grandfather        Lung, Prostate   Diabetes Paternal  Grandmother    Alzheimer's disease Paternal Grandmother    Colon cancer Neg Hx    Esophageal cancer Neg Hx    Pancreatic cancer Neg Hx    Stomach cancer Neg Hx    Colon polyps Neg Hx    Rectal cancer Neg Hx     Social History: I have reviewed social history from progress note on 04/04/2021. Social History   Socioeconomic History   Marital status: Married    Spouse name: mark   Number of children: 0   Years of education: Not on file   Highest education level: High school graduate  Occupational History    Employer: [employer]    Comment: [employer]  Tobacco Use   Smoking status: Former    Current packs/day: 0.00    Types: Cigarettes    Quit date: 01/24/1987    Years  since quitting: 35.5   Smokeless tobacco: Never  Vaping Use   Vaping status: Former   Substances: Flavoring   Devices: has vaped with flavor but no nicotine. Been 8 years  Substance and Sexual Activity   Alcohol use: Not Currently    Alcohol/week: 35.0 standard drinks of alcohol    Types: 35 Cans of beer per week   Drug use: No   Sexual activity: Not Currently  Other Topics Concern   Not on file  Social History Narrative   Regular exercise--no (because of knees)   Social Determinants of Health   Financial Resource Strain: Not on file  Food Insecurity: Not on file  Transportation Needs: Not on file  Physical Activity: Not on file  Stress: Not on file  Social Connections: Not on file    Allergies:  Allergies  Allergen Reactions   Lamictal [Lamotrigine] Rash   Penicillins Other (See Comments)    Did it involve swelling of the face/tongue/throat, SOB, or low BP? Unk Did it involve sudden or severe rash/hives, skin peeling, or any reaction on the inside of your mouth or nose? Unk Did you need to seek medical attention at a hospital or doctor's office? Unk When did it last happen? Was 58 years old; reaction not recalled     If all above answers are "NO", may proceed with cephalosporin use.     Codeine Itching    Crazy dreams and itching-pt said she can take it with food    Metabolic Disorder Labs: Lab Results  Component Value Date   HGBA1C 5.3 05/24/2022   MPG 105.41 05/24/2022   Lab Results  Component Value Date   PROLACTIN 16.9 05/24/2022   Lab Results  Component Value Date   CHOL 174 04/07/2022   TRIG 69 04/07/2022   HDL 66 04/07/2022   CHOLHDL 2.6 04/07/2022   VLDL 9.0 07/19/2021   LDLCALC 92 04/07/2022   LDLCALC 70 07/19/2021   Lab Results  Component Value Date   TSH 3.15 08/04/2022   TSH 1.87 04/07/2022    Therapeutic Level Labs: No results found for: "LITHIUM" No results found for: "VALPROATE" No results found for: "CBMZ"  Current Medications: Current Outpatient Medications  Medication Sig Dispense Refill   Acetaminophen (TYLENOL 8 HOUR ARTHRITIS PAIN PO) Take 2 tablets by mouth as needed.     busPIRone (BUSPAR) 15 MG tablet Take 1 tablet by mouth 2 (two) times daily. Take daily at 9 AM and at 4 PM 180 tablet 0   Cholecalciferol (VITAMIN D3) 50 MCG (2000 UT) capsule Take 1 capsule (2,000 Units total) by mouth daily. 100 capsule 0   colestipol (COLESTID) 1 g tablet Take 1 tablet (1 g total) by mouth 2 (two) times daily. Please schedule an office visit for further refills. Thank you 60 tablet 0   cyanocobalamin (VITAMIN B12) 1000 MCG/ML injection Inject 1 mL (1,000 mcg total) into the muscle every 30 (thirty) days. 10 mL 0   cyclobenzaprine (FLEXERIL) 10 MG tablet Take 1 tablet (10 mg total) by mouth 3 (three) times daily as needed. for muscle spams 30 tablet 2   Diclofenac Sodium 2 % SOLN Apply 1 pump twice daily as needed. 112 g 1   estradiol (DOTTI) 0.1 MG/24HR patch Place 1 patch (0.1 mg total) onto the skin 2 (two) times a week. 8 patch 1   estradiol (ESTRACE) 0.1 MG/GM vaginal cream Apply a pea-sized amount of cream to the fingertip and wipe in the front part of the vagina  twice weekly 42.5 g 0   fluticasone (FLONASE) 50 MCG/ACT nasal spray Place 2  sprays into both nostrils daily. 16 g 5   furosemide (LASIX) 40 MG tablet Take 1 tablet (40 mg total) by mouth daily. 90 tablet 1   Homeopathic Products (ALLERGY MEDICINE PO) Take 1 tablet by mouth as needed (Takes the Clortabs. Equate Brand).     HYDROcodone-acetaminophen (NORCO/VICODIN) 5-325 MG tablet Take 1 tablet by mouth every 6 (six) hours as needed. 15 tablet 0   hydrOXYzine (VISTARIL) 25 MG capsule Take 1 capsule (25 mg total) by mouth 2 (two) times daily as needed for anxiety. And sleep, racing thoughts 60 capsule 1   loperamide (IMODIUM) 2 MG capsule Take 2 mg by mouth daily as needed.      omeprazole (PRILOSEC) 40 MG capsule Take 1 capsule (40 mg total) by mouth 2 (two) times daily. OPEN CAPSULE AND SPRINKLE ON APPLESAUCE. 60 capsule 1   Probiotic Product (ALIGN) CHEW Chew 2 tablets by mouth daily. Align pre and probiotic with vitamain C-take one tablet each Align pre and probiotic with Vitamin b12 for immune and energy-take one tablet each     QUEtiapine (SEROQUEL) 25 MG tablet Take 1 tablet (25 mg total) by mouth at bedtime. 30 tablet 0   rOPINIRole (REQUIP) 1 MG tablet Take 1 tablet (1 mg total) by mouth at bedtime. 90 tablet 0   sertraline (ZOLOFT) 100 MG tablet Take 1 tablet (100 mg total) by mouth daily with breakfast. 90 tablet 0   SYRINGE-NEEDLE, DISP, 3 ML (B-D 3CC LUER-LOK SYR 25GX5/8") 25G X 5/8" 3 ML MISC USE AS DIRECTED WITH B-12 INJECTION 16 each 2   VITAMIN A PO Take by mouth.     zolpidem (AMBIEN) 5 MG tablet Take 1 tablet (5 mg total) by mouth at bedtime as needed for sleep. (Patient not taking: Reported on 08/04/2022) 30 tablet 1   No current facility-administered medications for this visit.     Musculoskeletal: Strength & Muscle Tone:  UTA Gait & Station: normal Patient leans: N/A  Psychiatric Specialty Exam: Review of Systems  Musculoskeletal:        Knee pain - mainly right  Psychiatric/Behavioral:  Positive for sleep disturbance. The patient is  nervous/anxious.     There were no vitals taken for this visit.There is no height or weight on file to calculate BMI.  General Appearance: Fairly Groomed  Eye Contact:  Fair  Speech:  Clear and Coherent  Volume:  Normal  Mood:  Anxious  Affect:  Appropriate  Thought Process:  Goal Directed and Descriptions of Associations: Intact  Orientation:  Full (Time, Place, and Person)  Thought Content: Logical   Suicidal Thoughts:  No  Homicidal Thoughts:  No  Memory:  Immediate;   Fair Recent;   Fair Remote;   Fair  Judgement:  Fair  Insight:  Fair  Psychomotor Activity:  Normal  Concentration:  Concentration: Fair and Attention Span: Fair  Recall:  [employer] of Knowledge: Fair  Language: Fair  Akathisia:  No  Handed:  Right  AIMS (if indicated): not done  Assets:  Communication Skills Desire for Improvement Housing  ADL's:  Intact  Cognition: WNL  Sleep:  Poor   Screenings: Geneticist, molecular Office Visit from 03/03/2022 in [facility] Office Visit from 01/20/2022 in [facility] Office Visit from 11/17/2021 in [facility] Office Visit from 06/13/2021 in  [facility] Office Visit from 05/25/2021 in [facility]  AIMS Total Score 0 0 0 0 0      GAD-7    Flowsheet Row Office Visit from 04/07/2022 in [facility] Office Visit from 01/20/2022 in [facility] Office Visit from 11/17/2021 in [facility] Office Visit from 10/19/2021 in [facility] Office Visit from 09/15/2021 in [facility]  Total GAD-7 Score 18 19 20 19 19       PHQ2-9    Flowsheet Row Video Visit from 08/21/2022 in  [facility] Video Visit from 07/04/2022 in [facility] Office Visit from 04/07/2022 in [facility] at [place] Office Visit from 01/20/2022 in [facility] Office Visit from 11/17/2021 in [facility]  PHQ-2 Total Score 1 0 5 4 5   PHQ-9 Total Score -- -- 19 16 21       Flowsheet Row Video Visit from 08/21/2022 in [facility] Video Visit from 07/04/2022 in [facility] Counselor from 06/07/2022 in [facility] at [facility] RISK CATEGORY Low Risk Low Risk Low Risk        Assessment and Plan: [name] is a 57 year old Caucasian female who has a history of PTSD, MDD, social anxiety, alcohol use disorder in remission, chronic pain was evaluated by telemedicine today.  Patient is currently anxious, due to situational stressors, disability being denied, her recent flareup of her pain as well as dizziness unspecified, has upcoming evaluation for the same.  Patient in the meantime will benefit from medication management as noted below.  Plan PTSD-improving Continue CBT with Ms. Christina Hussami Zoloft 100 mg p.o. daily.  Will consider increasing the dosage in the future.  However will await upcoming CT/MRI of her brain for dizziness. BuSpar 15 mg p.o. twice daily.  Reduced dosage. Ambien 2.5-5 mg p.o. nightly as needed-patient to hold this for now.  MDD in remission Seroquel 25 mg p.o. nightly Zoloft 100 mg p.o. daily Hydroxyzine 25 mg p.o. twice daily as needed Continue CBT  Social anxiety disorder-improving Continue CBT  RLS-improving Requip 1 mg p.o. nightly  Alcohol use disorder in remission Sober since February 2022.  Follow-up in clinic in 3 to 4 weeks or sooner if  needed. Collaboration of Care: Collaboration of Care: Referral or follow-up with counselor/therapist AEB patient Delorise Shiner to continue CBT as well as follow-up with her provider for dizziness.  Patient/Guardian was advised Release of Information must be obtained prior to any record release in order to collaborate their care with an outside provider. Patient/Guardian was advised if they have not already done so to contact the registration department to sign all necessary forms in order for [place] to release information regarding their care.   Consent: Patient/Guardian gives verbal consent for treatment and assignment of benefits for services provided during this visit. Patient/Guardian expressed understanding and agreed to proceed.   This note was generated in part or whole with voice recognition software. Voice recognition is usually quite accurate but there are transcription errors that can and very often do occur. I apologize for any typographical errors that were not detected and corrected.    Jomarie Longs, MD 08/21/2022, 4:48 PM

## 2022-08-23 ENCOUNTER — Ambulatory Visit (HOSPITAL_COMMUNITY): Payer: Medicaid Other | Admitting: Licensed Clinical Social Worker

## 2022-08-23 DIAGNOSIS — F4310 Post-traumatic stress disorder, unspecified: Secondary | ICD-10-CM | POA: Diagnosis not present

## 2022-08-23 DIAGNOSIS — F3341 Major depressive disorder, recurrent, in partial remission: Secondary | ICD-10-CM | POA: Diagnosis not present

## 2022-08-23 DIAGNOSIS — F4010 Social phobia, unspecified: Secondary | ICD-10-CM | POA: Diagnosis not present

## 2022-08-23 NOTE — Progress Notes (Unsigned)
Virtual Visit via Video Note  I connected with [name] on 08/23/22  at  3:00 PM EDT by a video enabled telemedicine application and verified that I am speaking with the correct person using two identifiers.  Location: Patient: home Provider: remote office [place], [place])   I discussed the limitations of evaluation and management by telemedicine and the availability of in person appointments. The patient expressed understanding and agreed to proceed.   I discussed the assessment and treatment plan with the patient. The patient was provided an opportunity to ask questions and all were answered. The patient agreed with the plan and demonstrated an understanding of the instructions.   The patient was advised to call back or seek an in-person evaluation if the symptoms worsen or if the condition fails to improve as anticipated.  I provided 55 minutes of non-face-to-face time during this encounter.   Amonte Brookover R Margan Elias, LCSW   THERAPIST PROGRESS NOTE  Session Time: [phone]  Participation Level: Active  Behavioral Response: Neat and Well GroomedAlertDepressed  Type of Therapy: Individual Therapy  Treatment Goals addressed: Reduce the negative impact trauma related symptoms have on social, occupational, and family functioning per pt self report 3 out of 5 sessions documented.   : Reduction in intrusive event recollections, avoidance of event reminders, intense arousal, or disinterest in activities or relationships  as evidenced by pt self report 3 out of 5 sessions documente   ProgressTowards Goals: Progressing  Interventions: CBT and Other: trauma focused  Summary: [name] is a 57 y.o. female who presents with continuing symptoms related to PTSD and depression.    Pt looking forward to helping father up at the cabin front porch--no cell service.   Knee pain  Fear of public--fighting cussing   Continued recommendations are as follows: self care behaviors, positive  social engagements, focusing on overall work/home/life balance, and focusing on positive physical and emotional wellness.   Suicidal/Homicidal: No.   Therapist Response: Pt is continuing to apply interventions learned in session into daily life situations. Pt is currently on track to meet goals utilizing interventions mentioned above. Personal growth and progress noted. Treatment to continue as indicated.   Patient continues to make good progress with identification of trauma triggers, and identification of coping skills that are helpful to her.  Patient is continuing to focus on her overall self-care activities and behaviors to regulate stress currently.  Plan: Informed patient that clinician will be leaving outpatient department. Allowed pt to explore any questions or concerns and discussed future counseling options/resources. Provided pt with psychoeducational resources and list of OPT therapists. Encouraged pt to continue with psychiatric med management appointments, if applicable.   Diagnosis:  Encounter Diagnoses  Name Primary?   PTSD (post-traumatic stress disorder) Yes   Recurrent major depressive disorder, in partial remission (HCC)    Social anxiety disorder      Collaboration of Care: Other pt encouraged to continue care with psychiatrist of record, Dr. Jomarie Longs  Patient/Guardian was advised Release of Information must be obtained prior to any record release in order to collaborate their care with an outside provider. Patient/Guardian was advised if they have not already done so to contact the registration department to sign all necessary forms in order for [place] to release information regarding their care.   Consent: Patient/Guardian gives verbal consent for treatment and assignment of benefits for services provided during this visit. Patient/Guardian expressed understanding and agreed to proceed.   Ernest Haber Brynn Mulgrew, LCSW 08/23/2022

## 2022-08-25 ENCOUNTER — Telehealth: Payer: Self-pay | Admitting: Family Medicine

## 2022-08-25 DIAGNOSIS — G8929 Other chronic pain: Secondary | ICD-10-CM

## 2022-08-25 NOTE — Telephone Encounter (Signed)
Pt called and stated that her scheduled procedure to receive an MRI was denied. Per her insurance they need more information from Dr. Laury Axon to approve the order put in for the pt. Please advise pt and follow up with insurance about peer to peer.

## 2022-08-25 NOTE — Telephone Encounter (Signed)
Pt called to inform us that her knee is not any better. Had injection and xray 7/26, xray results still pending.

## 2022-08-28 ENCOUNTER — Other Ambulatory Visit: Payer: Self-pay | Admitting: Family Medicine

## 2022-08-28 ENCOUNTER — Other Ambulatory Visit: Payer: Self-pay | Admitting: Psychiatry

## 2022-08-28 DIAGNOSIS — F4310 Post-traumatic stress disorder, unspecified: Secondary | ICD-10-CM

## 2022-08-28 DIAGNOSIS — F3342 Major depressive disorder, recurrent, in full remission: Secondary | ICD-10-CM

## 2022-08-28 NOTE — Progress Notes (Signed)
Right knee x-ray shows mild arthritis.  No fractures are present.

## 2022-08-28 NOTE — Telephone Encounter (Signed)
Next step may be an MRI of the knee.  Would you like me to order that now?  There are other injections that we could do but I would like to be more certain what the problem is before we try other injections.

## 2022-08-29 ENCOUNTER — Other Ambulatory Visit: Payer: Medicaid Other

## 2022-08-29 ENCOUNTER — Other Ambulatory Visit: Payer: Self-pay

## 2022-08-29 MED ORDER — QUETIAPINE FUMARATE 25 MG PO TABS
25.0000 mg | ORAL_TABLET | Freq: Every day | ORAL | 0 refills | Status: DC
Start: 2022-08-29 — End: 2022-09-21
  Filled 2022-08-29: qty 30, 30d supply, fill #0

## 2022-08-29 NOTE — Telephone Encounter (Signed)
PCP placed referral. 

## 2022-08-30 ENCOUNTER — Other Ambulatory Visit: Payer: Self-pay

## 2022-08-30 ENCOUNTER — Encounter: Payer: Self-pay | Admitting: Physician Assistant

## 2022-08-30 ENCOUNTER — Ambulatory Visit (INDEPENDENT_AMBULATORY_CARE_PROVIDER_SITE_OTHER): Payer: Medicaid Other | Admitting: Physician Assistant

## 2022-08-30 VITALS — BP 100/60 | HR 64 | Ht 62.0 in | Wt 161.0 lb

## 2022-08-30 DIAGNOSIS — K275 Chronic or unspecified peptic ulcer, site unspecified, with perforation: Secondary | ICD-10-CM

## 2022-08-30 DIAGNOSIS — K219 Gastro-esophageal reflux disease without esophagitis: Secondary | ICD-10-CM

## 2022-08-30 DIAGNOSIS — Z9884 Bariatric surgery status: Secondary | ICD-10-CM

## 2022-08-30 DIAGNOSIS — K529 Noninfective gastroenteritis and colitis, unspecified: Secondary | ICD-10-CM

## 2022-08-30 MED ORDER — COLESTIPOL HCL 1 G PO TABS
2.0000 g | ORAL_TABLET | Freq: Two times a day (BID) | ORAL | 11 refills | Status: DC
  Filled 2022-08-30: qty 120, 30d supply, fill #0
  Filled 2022-09-27: qty 120, 30d supply, fill #1
  Filled 2022-10-27: qty 120, 30d supply, fill #2
  Filled 2022-11-28 – 2022-12-12 (×2): qty 120, 30d supply, fill #3

## 2022-08-30 MED ORDER — OMEPRAZOLE 40 MG PO CPDR
40.0000 mg | DELAYED_RELEASE_CAPSULE | Freq: Two times a day (BID) | ORAL | 11 refills | Status: DC
  Filled 2022-08-30 – 2022-09-27 (×2): qty 60, 30d supply, fill #0
  Filled 2022-10-27: qty 60, 30d supply, fill #1
  Filled 2022-11-28 – 2022-12-12 (×2): qty 60, 30d supply, fill #2
  Filled 2023-01-10: qty 60, 30d supply, fill #3
  Filled 2023-02-19: qty 60, 30d supply, fill #4
  Filled 2023-03-22: qty 60, 30d supply, fill #5
  Filled 2023-05-21: qty 60, 30d supply, fill #6
  Filled 2023-06-20: qty 60, 30d supply, fill #7
  Filled 2023-07-18: qty 60, 30d supply, fill #8

## 2022-08-30 NOTE — Patient Instructions (Addendum)
_______________________________________________________  If your blood pressure at your visit was 140/90 or greater, please contact your primary care physician to follow up on this.  If you are age 57 or younger, your body mass index should be between 19-25. Your Body mass index is 29.45 kg/m. If this is out of the aformentioned range listed, please consider follow up with your Primary Care Provider.  ________________________________________________________  The [facility] providers would like to encourage you to use [facility] to communicate with providers for non-urgent requests or questions.  Due to long hold times on the telephone, sending your provider a message by [facility] may be a faster and more efficient way to get a response.  Please allow 48 business hours for a response.  Please remember that this is for non-urgent requests.  _______________________________________________________  CONTINUE: omeprazole one capsule two times daily before meals.  Open capsule and spinkle.  INCREASE: Colestipol 1gm take two tablets twice daily.  Take 2 hours away from other medications.  You have been given a testing kit to check for small intestine bacterial overgrowth (SIBO) which is completed by a company named [facility]. Make sure to return your test in the mail using the return mailing label given to you along with the kit. The test order, your demographic and insurance information have all already been sent to the company. [facility] will collect an upfront charge of $99.74 for commercial insurance plans and $209.74 is you are paying cash. Make sure to discuss with [facility] PRIOR to having the test to see if they have gotten information from your insurance company as to how much your testing will cost out of pocket, if any. Please contact [facility] at phone number [phone] to get instructions regarding how to perform the test as our office is unable to give specific testing  instructions.  Thank you for entrusting me with your care and choosing [facility].  Amy Esterwood, PA-C

## 2022-08-30 NOTE — Addendum Note (Signed)
Addended by: Debbe Odea R on: 08/30/2022 12:45 PM   Modules accepted: Orders

## 2022-08-30 NOTE — Addendum Note (Signed)
Addended by: Dierdre Searles on: 08/30/2022 07:57 AM   Modules accepted: Orders

## 2022-08-30 NOTE — Progress Notes (Signed)
Subjective:    Patient ID: [name], female    DOB: 1965/08/17, 57 y.o.   MRN: [id number]  HPI  [name] is a 57 year old female, established with Dr. Adela Lank who comes in today for follow-up.  She was last seen in the office in January 2023 by Dr. Adela Lank.  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  She has also had problems with chronic diarrhea. She has been on omeprazole 40 mg twice daily, opening capsules and taking with food, and that seems to be working well. She continues to struggle with chronic diarrhea.  She says that she had run out of colestipol but even when she was on it twice a day 1 g she has diarrhea on a daily basis and says that "diarrhea has overtaken her life".  She is still needing to take up to 6 Imodium per day and even with that we will have accidents which she finds extremely embarrassing.  She intentionally tries to eat more constipating foods like cheese but this does not seem to help.  She is having chronic problems with her knees, and arthritis, and is working on disability.  She had been on both Zenpep in the past and also Creon but did not feel that either of these helped the diarrhea.  She did have a mildly low fecal elastase documented at 189 previously. Prior notes state that she had been drinking 6 beers a day but has stopped that currently.  She has not found anything other than Imodium at this point that she feels helps the diarrhea.  She has not had SIBO testing previously or empiric trial of Xifaxan. She did undergo HIDA scan in February 2023 with plaints of abdominal pain and EF was 72%. Last EGD May 2022 with normal esophagus, Roux-en-Y anatomy with healthy anastomosis. Colonoscopy in May 2022 negative and random biopsies were done for microscopic colitis and negative.  Other medical  issues include hypertension, asthma, depression, restless leg syndrome, PTSD, social anxiety.  Review of Systems Pertinent positive and negative review of systems were noted in the above HPI section.  All other review of systems was otherwise negative.   Outpatient Encounter Medications as of 08/30/2022  Medication Sig   Acetaminophen (TYLENOL 8 HOUR ARTHRITIS PAIN PO) Take 2 tablets by mouth as needed.   busPIRone (BUSPAR) 15 MG tablet Take 1 tablet by mouth 2 (two) times daily. Take daily at 9 AM and at 4 PM   Cholecalciferol (VITAMIN D3) 50 MCG (2000 UT) capsule Take 1 capsule (2,000 Units total) by mouth daily.   cyanocobalamin (VITAMIN B12) 1000 MCG/ML injection Inject 1 mL (1,000 mcg total) into the muscle every 30 (thirty) days.   cyclobenzaprine (FLEXERIL) 10 MG tablet Take 1 tablet (10 mg total) by mouth 3 (three) times daily as needed. for muscle spams   Diclofenac Sodium 2 % SOLN Apply 1 pump twice daily as needed.   estradiol (DOTTI) 0.1 MG/24HR patch Place 1 patch (0.1 mg total) onto the skin 2 (two) times a week.   estradiol (ESTRACE) 0.1 MG/GM vaginal cream Apply a pea-sized amount of cream to the fingertip and wipe in the front part of the vagina twice weekly   fluticasone (FLONASE) 50 MCG/ACT nasal spray Place 2 sprays into both nostrils daily.   furosemide (LASIX) 40 MG tablet Take 1 tablet (40 mg total) by  mouth daily.   Homeopathic Products (ALLERGY MEDICINE PO) Take 1 tablet by mouth as needed (Takes the Clortabs. Equate Brand).   HYDROcodone-acetaminophen (NORCO/VICODIN) 5-325 MG tablet Take 1 tablet by mouth every 6 (six) hours as needed.   hydrOXYzine (VISTARIL) 25 MG capsule Take 1 capsule (25 mg total) by mouth 2 (two) times daily as needed for anxiety. And sleep, racing thoughts   loperamide (IMODIUM) 2 MG capsule Take 2 mg by mouth daily as needed.    Probiotic Product (ALIGN) CHEW Chew 2 tablets by mouth daily. Align pre and probiotic with vitamain C-take one tablet  each Align pre and probiotic with Vitamin b12 for immune and energy-take one tablet each   QUEtiapine (SEROQUEL) 25 MG tablet Take 1 tablet (25 mg total) by mouth at bedtime.   rOPINIRole (REQUIP) 1 MG tablet Take 1 tablet (1 mg total) by mouth at bedtime.   sertraline (ZOLOFT) 100 MG tablet Take 1 tablet (100 mg total) by mouth daily with breakfast.   SYRINGE-NEEDLE, DISP, 3 ML (B-D 3CC LUER-LOK SYR 25GX5/8") 25G X 5/8" 3 ML MISC USE AS DIRECTED WITH B-12 INJECTION   VITAMIN A PO Take by mouth.   zolpidem (AMBIEN) 5 MG tablet Take 1 tablet (5 mg total) by mouth at bedtime as needed for sleep.   [DISCONTINUED] colestipol (COLESTID) 1 g tablet Take 1 tablet (1 g total) by mouth 2 (two) times daily. Please schedule an office visit for further refills. Thank you (Patient taking differently: Take 1 g by mouth 2 (two) times daily.)   [DISCONTINUED] omeprazole (PRILOSEC) 40 MG capsule Take 1 capsule (40 mg total) by mouth 2 (two) times daily. OPEN CAPSULE AND SPRINKLE ON APPLESAUCE.   colestipol (COLESTID) 1 g tablet Take 2 tablets (2 g total) by mouth 2 (two) times daily. Take 2 hours away from other medications.   omeprazole (PRILOSEC) 40 MG capsule Take 1 capsule (40 mg total) by mouth 2 (two) times daily. OPEN CAPSULE AND SPRINKLE ON APPLESAUCE.   No facility-administered encounter medications on file as of 08/30/2022.   Allergies  Allergen Reactions   Lamictal [Lamotrigine] Rash   Penicillins Other (See Comments)    Did it involve swelling of the face/tongue/throat, SOB, or low BP? Unk Did it involve sudden or severe rash/hives, skin peeling, or any reaction on the inside of your mouth or nose? Unk Did you need to seek medical attention at a hospital or doctor's office? Unk When did it last happen? Was 57 years old; reaction not recalled     If all above answers are "NO", may proceed with cephalosporin use.    Codeine Itching    Crazy dreams and itching-pt said she can take it with food    Patient Active Problem List   Diagnosis Date Noted   Cervical stenosis of spine 08/04/2022   At risk for prolonged QT interval syndrome 10/19/2021   PTSD (post-traumatic stress disorder) 04/04/2021   MDD (major depressive disorder), recurrent episode, moderate (HCC) 04/04/2021   Social anxiety disorder 04/04/2021   Dizziness 11/04/2020   Epistaxis 11/04/2020   Chest pain of uncertain etiology 11/04/2020   Anxiety 09/23/2020   Attention and concentration deficit 09/23/2020   Degenerative spondylolisthesis 04/05/2020   Low back pain with radiation 02/06/2020   Bilateral lower extremity edema 04/07/2019   Pain in both lower extremities 04/07/2019   Numbness in both hands 04/07/2019   Pain in right arm 04/07/2019   Recurrent urinary tract infection 10/24/2018   Menopausal syndrome 10/24/2018  Abnormal mammogram 10/24/2018   Abdominal pain, epigastric    Marginal ulcers    Melena 07/10/2018   Alcohol use disorder, severe, in sustained remission (HCC) 07/10/2018   Encounter for screening for HIV 07/10/2018   Asthma 07/10/2018   Exposure to COVID-19 virus 05/28/2018   GERD (gastroesophageal reflux disease) 05/28/2018   Free intraperitoneal air 03/08/2018   Perforated ulcer (HCC) 03/08/2018   Cervical radiculopathy at C7 02/22/2018   Essential hypertension 08/02/2017   Hematoma of hip, left, initial encounter 06/28/2017   Neck pain 06/28/2017   Nonallopathic lesion of cervical region 06/28/2017   Nonallopathic lesion of thoracic region 06/28/2017   Nonallopathic lesion of lumbar region 06/28/2017   Biomechanical lesion, unspecified 06/28/2017   Muscle spasm 04/20/2017   URI (upper respiratory infection) 10/02/2016   Preventative health care 05/01/2016   Degenerative arthritis of knee, bilateral 06/02/2015   Patellofemoral arthritis of left knee 05/04/2015   Cough 09/02/2013   Acute pharyngitis 09/02/2013   Sinusitis 06/25/2013   Dog bite of face 04/16/2013    Gastroenteritis 04/16/2013   Allergic rhinitis 04/16/2013   Mass of soft tissue 08/02/2012   RLS (restless legs syndrome) 01/21/2009   LYMPHOPENIA 03/23/2008   Depression 02/27/2008   HEMANGIOMA, SKIN 02/04/2008   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 Been 8 years  Substance and Sexual Activity   Alcohol use: Not Currently    Alcohol/week: 35.0 standard drinks of alcohol    Types: 35 Cans of beer per week   Drug use: No   Sexual activity: Not Currently  Other Topics Concern   Not on file  Social History Narrative   Regular exercise--no (because of knees)   Social Determinants of Health   Financial Resource Strain: Not on file  Food Insecurity: Not on file  Transportation Needs: Not on file  Physical Activity: Not on file  Stress: Not on file  Social Connections: Not on file  Intimate Partner Violence: Not on file    Ms. Budden's family history includes Alcohol abuse in her brother and sister; Alzheimer's disease in her maternal grandmother, mother, and paternal grandmother; Cancer in her father, maternal grandfather, and paternal grandfather; Depression in her brother and sister; Diabetes in her maternal grandfather, maternal grandmother, paternal grandfather, and paternal grandmother; Hypertension in her father; Liver disease in her maternal grandfather; Stroke in her mother.      Objective:    Vitals:   08/30/22  0901  BP: 100/60  Pulse: 64    Physical Exam Well-developed well-nourished older WF  in no acute distress.  Height, Weight,161 BMI 29.45  HEENT; nontraumatic normocephalic, EOMI, PE R LA, sclera anicteric. Oropharynx;not done Neck; supple, no JVD Cardiovascular; regular rate and rhythm with S1-S2, no murmur rub or gallop Pulmonary; Clear bilaterally Abdomen; soft, nontender, nondistended, no palpable mass or hepatosplenomegaly,  bowel sounds are active to hyperactive Rectal;not done Skin; benign exam, no jaundice rash or appreciable lesions Extremities; no clubbing cyanosis or edema skin warm and dry Neuro/Psych; alert and oriented x4, grossly nonfocal mood and affect appropriate        Assessment & Plan:   #71 57 year old white female status post Roux-en-Y gastric bypass 2007, complicated by perforated anastomotic ulcer 2020 felt secondary to NSAIDs.  This required exploratory lap and repair. She is maintained on twice daily omeprazole 40 mg opening capsule prior to ingestion.  #2 chronic diarrhea-negative colonoscopy including random biopsies May 2022, Mildly low fecal elastase but no response to prior courses of Zenpep and Creon  She has been on colestipol 1 g twice daily until she ran out a couple of months ago but did not feel that this was making much difference and was still taking at least 6 Imodium per day and having 7-8 bowel movements per day and episodes of incontinence.  Consider course of Xifaxan for IBS-D, consider SIBO  #2 hypertension  #3.  Asthma #4.  Depression/PTSD/social anxiety #5.  Restless leg syndrome #6  history of EtOH use-inactive  Plan; refill omeprazole 40 mg p.o. twice daily patient to open capsule and take with food twice daily Will increase colestipol to 2 g p.o. twice daily (prescription is for 1 g tablet take 2 p.o. twice daily Will proceed with breath testing for SIBO, treat with course of Xifaxan if positive, it was discussed with the  patient We will leave off pancreatic enzymes at this point as she did not find helpful Follow-up with Dr. Adela Lank or myself as needed.  Hopefully will have response to increased dose of Colestid, this can be increased further if needed. 6.  Chronic pain/arthritis   S  PA-C 08/30/2022   Cc: Zola Button, [place]ling Congress, *

## 2022-08-31 NOTE — Progress Notes (Signed)
Agree with assessment and plan as outlined.  

## 2022-09-03 ENCOUNTER — Other Ambulatory Visit: Payer: Self-pay

## 2022-09-04 ENCOUNTER — Telehealth: Payer: Self-pay | Admitting: Family Medicine

## 2022-09-04 ENCOUNTER — Other Ambulatory Visit: Payer: Self-pay

## 2022-09-04 NOTE — Telephone Encounter (Signed)
[name] with [facility] called to advise that the MRI was approved; she said they do not have reference numbers. Per [name], patient has been advised she can schedule up until 11/04/2022. We will receive a letter in the mail

## 2022-09-05 ENCOUNTER — Telehealth: Payer: Self-pay

## 2022-09-05 NOTE — Telephone Encounter (Signed)
Forwarding to Dr. Denyse Amass and Dorathy Daft regarding HEP print out.

## 2022-09-05 NOTE — Telephone Encounter (Signed)
Patients knee MRI will be denied if I do not provide   Specific dates and duration of active conservative care (physical therapy, chiropractic, osteopathic manipulative treatment or home exercise program) showing 4 weeks worth within the past 6 months.  I see that an exercise program was given at the first knee pain visit with [place] which was on 10/25/2021, but I do not know what those exercises are or the duration of when these exercises took place.   All the other visits she has with [place] it says to continue the exercises, the only specific thing I see is "work on quad exercises"   Can I get a print out of what hip exercises and quad exercises that were given to patient so I can let the insurance company know.

## 2022-09-10 ENCOUNTER — Other Ambulatory Visit: Payer: Medicaid Other

## 2022-09-10 DIAGNOSIS — M4802 Spinal stenosis, cervical region: Secondary | ICD-10-CM

## 2022-09-11 ENCOUNTER — Ambulatory Visit: Payer: Medicaid Other | Admitting: Orthopedic Surgery

## 2022-09-11 NOTE — Telephone Encounter (Signed)
We are having a hard time getting your MRI approved.  We need to prove your home exercise program.  We need to have 6 weeks of physician directed home exercise program over the last 3 or 6 months.  Recommend schedule an appointment with me so that we can get this MRI approved.

## 2022-09-11 NOTE — Telephone Encounter (Signed)
Called pt and advised per Dr. Denyse Amass. Pt verbalized understanding. Appt scheduled with Dr. Denyse Amass on 09/15/22.

## 2022-09-14 NOTE — Progress Notes (Signed)
   Rubin Payor, PhD, LAT, ATC acting as a scribe for Clementeen Graham, MD.  [name] is a 57 y.o. female who presents to [facility] today for f/u R knee pain. Pt was last seen by Dr. Denyse Amass on 08/18/22 and was given a R knee steroid injection and was advised to cont HEP and Voltaren gel. Pt called Aug 2nd reporting no relief from prior steroid injection and a MRI was ordered.   Today, pt reports R knee pain is about the same. Pain w/ certain movements and prolonged standing. She has been working on a few quad strengthening exercises on her own at home.  Specifically at least daily for at least 20 minutes she has been doing straight leg raises and open chain quad strengthening exercises since October 2023.   Dx imaging: R knee MRI---- pending 10/25/21 R knee XR   Pertinent review of systems: No fevers or chills  Relevant historical information: Hypertension   Exam:  Ht 5\' 2"  (1.575 m)   Wt 161 lb (73 kg)   BMI 29.45 kg/m  General: Well Developed, well nourished, and in no acute distress.   MSK: Right knee normal-appearing Normal motion. Stable ligamentous exam. Tender palpation medial joint line.     Assessment and Plan: 57 y.o. female with right knee pain.  This is a chronic ongoing issue.  Retrying to get an MRI approved.  She is exhausted conservative management at this point and MRI will be helpful for future treatment planning including injections or surgery.  She has been completing home exercise program since October 2023.  Specifically as above quad strengthening and open chain exercises.  We have provided a example of the exercises attached to the after visit summary today's note.  We should be able to get the MRI approved.  It is tentatively scheduled for August 29.  I cautioned her that she should not get this MRI unless she is confident it has been approved.   PDMP not reviewed this encounter. No orders of the defined types were  placed in this encounter.  No orders of the defined types were placed in this encounter.    Discussed warning signs or symptoms. Please see discharge instructions. Patient expresses understanding.   The above documentation has been reviewed and is accurate and complete Clementeen Graham, M.D.

## 2022-09-15 ENCOUNTER — Ambulatory Visit (INDEPENDENT_AMBULATORY_CARE_PROVIDER_SITE_OTHER): Payer: Medicaid Other | Admitting: Family Medicine

## 2022-09-15 VITALS — Ht 62.0 in | Wt 161.0 lb

## 2022-09-15 DIAGNOSIS — M25561 Pain in right knee: Secondary | ICD-10-CM | POA: Diagnosis not present

## 2022-09-15 DIAGNOSIS — G8929 Other chronic pain: Secondary | ICD-10-CM | POA: Diagnosis not present

## 2022-09-15 NOTE — Patient Instructions (Addendum)
Thank you for coming in today.   Please complete the exercises that the athletic trainer went over with you:  View at [email or web address] using code: [email or web address]  Continue home exercises.   Do not get the knee MRI on the 29th unless you are sure you have the authorization.   We will re file it ASAP.

## 2022-09-21 ENCOUNTER — Other Ambulatory Visit: Payer: Self-pay

## 2022-09-21 ENCOUNTER — Encounter: Payer: Self-pay | Admitting: Psychiatry

## 2022-09-21 ENCOUNTER — Telehealth (INDEPENDENT_AMBULATORY_CARE_PROVIDER_SITE_OTHER): Payer: Medicaid Other | Admitting: Psychiatry

## 2022-09-21 ENCOUNTER — Ambulatory Visit
Admission: RE | Admit: 2022-09-21 | Discharge: 2022-09-21 | Disposition: A | Discharge status: Home / Self Care | Payer: Medicaid Other | Source: Ambulatory Visit | Attending: Family Medicine | Admitting: Family Medicine

## 2022-09-21 DIAGNOSIS — F3341 Major depressive disorder, recurrent, in partial remission: Secondary | ICD-10-CM | POA: Diagnosis not present

## 2022-09-21 DIAGNOSIS — G2581 Restless legs syndrome: Secondary | ICD-10-CM | POA: Diagnosis not present

## 2022-09-21 DIAGNOSIS — F1021 Alcohol dependence, in remission: Secondary | ICD-10-CM

## 2022-09-21 DIAGNOSIS — F4310 Post-traumatic stress disorder, unspecified: Secondary | ICD-10-CM

## 2022-09-21 DIAGNOSIS — F4010 Social phobia, unspecified: Secondary | ICD-10-CM

## 2022-09-21 DIAGNOSIS — G8929 Other chronic pain: Secondary | ICD-10-CM

## 2022-09-21 MED ORDER — QUETIAPINE FUMARATE 50 MG PO TABS
50.0000 mg | ORAL_TABLET | Freq: Every day | ORAL | 1 refills | Status: DC
Start: 2022-09-21 — End: 2022-11-22
  Filled 2022-09-21: qty 30, 30d supply, fill #0
  Filled 2022-10-27: qty 30, 30d supply, fill #1

## 2022-09-21 MED ORDER — BUSPIRONE HCL 15 MG PO TABS
15.0000 mg | ORAL_TABLET | Freq: Two times a day (BID) | ORAL | 0 refills | Status: DC
Start: 2022-09-21 — End: 2023-01-12
  Filled 2022-09-21: qty 180, 90d supply, fill #0

## 2022-09-21 MED ORDER — SERTRALINE HCL 100 MG PO TABS
100.0000 mg | ORAL_TABLET | Freq: Every day | ORAL | 0 refills | Status: DC
Start: 2022-09-21 — End: 2022-11-01
  Filled 2022-09-21 – 2022-10-13 (×2): qty 90, 90d supply, fill #0

## 2022-09-21 NOTE — Progress Notes (Signed)
Virtual Visit via Video Note  I connected with [name] on 09/21/22 at  4:00 PM EDT by a video enabled telemedicine application and verified that I am speaking with the correct person using two identifiers.  Location Provider Location : [facility] Patient Location : Home  Participants: Patient , Provider    I discussed the limitations of evaluation and management by telemedicine and the availability of in person appointments. The patient expressed understanding and agreed to proceed.   I discussed the assessment and treatment plan with the patient. The patient was provided an opportunity to ask questions and all were answered. The patient agreed with the plan and demonstrated an understanding of the instructions.   The patient was advised to call back or seek an in-person evaluation if the symptoms worsen or if the condition fails to improve as anticipated.   BH MD OP Progress Note  09/21/2022 4:29 PM Bonne Difede  MRN:  [id number]  Chief Complaint:  Chief Complaint  Patient presents with   Follow-up   Anxiety   Depression   Medication Refill   HPI: [name] is a 57 year old Caucasian female, married, unemployed, has a history of PTSD, MDD, social anxiety disorder, RLS, alcohol use disorder in remission, chronic pain, lives in [place], was evaluated by telemedicine today.  Patient today reports she is currently still struggling with dizziness.  She reports there are times when she feels lightheaded with the change in the position of her head.  Patient reports she also continues to struggle with a lot of pain.  Especially of her right sided knee, pain is getting worse.  She is currently on multiple medications for her pain including Vicodin, Tylenol, as well as muscle relaxants Flexeril.  Patient reports she had her MRI of her manage done this morning.  She is waiting for the report.  Patient reports although depression symptoms may have improved on the Seroquel she  continues to feel irritable.  She reports she also struggles with anxiety.  Sleep has definitely improved.  She does not believe the Seroquel has made her dizziness worse.  She is agreeable to a trial of a higher dosage of Seroquel.  If it does make her dizziness worse we will need to reduce the dosage back.  Patient is frustrated with the fact that her therapist left the practice.  She is interested in continuing her psychotherapy.  Patient denies any suicidality, homicidality or perceptual disturbances.  Patient denies any other concerns today.  Visit Diagnosis:    ICD-10-CM   1. PTSD (post-traumatic stress disorder)  F43.10 QUEtiapine (SEROQUEL) 50 MG tablet    sertraline (ZOLOFT) 100 MG tablet    busPIRone (BUSPAR) 15 MG tablet    2. Recurrent major depressive disorder, in partial remission (HCC)  F33.41 QUEtiapine (SEROQUEL) 50 MG tablet    busPIRone (BUSPAR) 15 MG tablet    3. Social anxiety disorder  F40.10 sertraline (ZOLOFT) 100 MG tablet    4. RLS (restless legs syndrome)  G25.81     5. Alcohol use disorder, severe, in sustained remission (HCC)  F10.21       Past Psychiatric History: I have reviewed past psychiatric history from progress note on 04/04/2021.  Trials of medications-Wellbutrin, Abilify, Zoloft, Xanax, Cymbalta, Pristiq, BuSpar, Deplin, Lexapro, Valium, Viibryd, Trintellix, Lamictal.  Patient had psychological testing completed-05/05/2021-Dr. Rodenbough-does not meet criteria for ADHD.  Patient was evaluated on 06/01/2022-patient to continue psychiatric treatment for PTSD, depression, sleep.  Past Medical History:  Past Medical History:  Diagnosis  Date   Allergy    Anemia    Anxiety    Asthma    Depression    GERD (gastroesophageal reflux disease)    Heart murmur    from childhood   Hiatal hernia    Hypertension    Peptic ulcer    PTSD (post-traumatic stress disorder)    UTI (urinary tract infection)     Past Surgical History:  Procedure Laterality Date    ABDOMINAL HYSTERECTOMY     fibroids   BIOPSY  07/11/2018   Procedure: BIOPSY;  Surgeon: Meryl Dare, MD;  Location: [facility] ENDOSCOPY;  Service: Endoscopy;;   COLONOSCOPY  2014   ESOPHAGOGASTRODUODENOSCOPY (EGD) WITH PROPOFOL N/A 07/11/2018   Procedure: ESOPHAGOGASTRODUODENOSCOPY (EGD) WITH PROPOFOL;  Surgeon: Meryl Dare, MD;  Location: [facility] ENDOSCOPY;  Service: Endoscopy;  Laterality: N/A;   GASTRIC BYPASS  2007   HEMOSTASIS CLIP PLACEMENT  07/11/2018   Procedure: HEMOSTASIS CLIP PLACEMENT;  Surgeon: Meryl Dare, MD;  Location[facility] END[facility]COPY;  Service: Endoscopy;;   LAPAROTOMY N/A 03/08/2018   Procedure: EXPLORATORY LAPAROTOMY, REPAIR AND PATCH CLOSURE OF PERFORATED ULCER;  Surgeon: Violeta Gelinas, MD;  Location: [facility] OR;  Service: General;  Laterality: N/A;   TUBAL LIGATION      Family Psychiatric History: I have reviewed family psychiatric history from progress note on 04/04/2021.  Family History:  Family History  Problem Relation Age of Onset   Stroke Mother    Alzheimer's disease Mother    Hypertension Father    Cancer Father        Prostate   Alcohol abuse Sister    Depression Sister    Alcohol abuse Brother    Depression Brother    Diabetes Maternal Grandfather    Liver disease Maternal Grandfather    Cancer Maternal Grandfather        Prostate   Diabetes Maternal Grandmother    Alzheimer's disease Maternal Grandmother    Diabetes Paternal Grandfather    Cancer Paternal Grandfather        Lung, Prostate   Diabetes Paternal Grandmother    Alzheimer's disease Paternal Grandmother    Colon cancer Neg Hx    Esophageal cancer Neg Hx    Pancreatic cancer Neg Hx    Stomach cancer Neg Hx    Colon polyps Neg Hx    Rectal cancer Neg Hx     Social History: I have reviewed social history from progress note on 04/04/2021. Social History   Socioeconomic History   Marital status: Married    Spouse name: mark   Number of children: 0   Years of education: Not on file    Highest education level: High school graduate  Occupational History    Employer: [employer]    Comment: [employer]  Tobacco Use   Smoking status: Former    Current packs/day: 0.00    Types: Cigarettes    Quit date: 01/24/1987    Years since quitting: 35.6   Smokeless tobacco: Never  Vaping Use   Vaping status: Former   Substances: Flavoring   Devices: has vaped with flavor but no nicotine. Been 8 years  Substance and Sexual Activity   Alcohol use: Not Currently    Alcohol/week: 35.0 standard drinks of alcohol    Types: 35 Cans of beer per week   Drug use: No   Sexual activity: Not Currently  Other Topics Concern   Not on file  Social History Narrative   Regular exercise--no (because of knees)  Social Determinants of Health   Financial Resource Strain: Not on file  Food Insecurity: Not on file  Transportation Needs: Not on file  Physical Activity: Not on file  Stress: Not on file  Social Connections: Not on file    Allergies:  Allergies  Allergen Reactions   Lamictal [Lamotrigine] Rash   Penicillins Other (See Comments)    Did it involve swelling of the face/tongue/throat, SOB, or low BP? Unk Did it involve sudden or severe rash/hives, skin peeling, or any reaction on the inside of your mouth or nose? Unk Did you need to seek medical attention at a hospital or doctor's office? Unk When did it last happen? Was 57 years old; reaction not recalled     If all above answers are "NO", may proceed with cephalosporin use.    Codeine Itching    Crazy dreams and itching-pt said she can take it with food    Metabolic Disorder Labs: Lab Results  Component Value Date   HGBA1C 5.3 05/24/2022   MPG 105.41 05/24/2022   Lab Results  Component Value Date   PROLACTIN 16.9 05/24/2022   Lab Results  Component Value Date   CHOL 174 04/07/2022   TRIG 69 04/07/2022   HDL 66 04/07/2022   CHOLHDL 2.6 04/07/2022   VLDL 9.0 07/19/2021   LDLCALC 92 04/07/2022   LDLCALC 70  07/19/2021   Lab Results  Component Value Date   TSH 3.15 08/04/2022   TSH 1.87 04/07/2022    Therapeutic Level Labs: No results found for: "LITHIUM" No results found for: "VALPROATE" No results found for: "CBMZ"  Current Medications: Current Outpatient Medications  Medication Sig Dispense Refill   QUEtiapine (SEROQUEL) 50 MG tablet Take 1 tablet (50 mg total) by mouth at bedtime. 30 tablet 1   Acetaminophen (TYLENOL 8 HOUR ARTHRITIS PAIN PO) Take 2 tablets by mouth as needed.     busPIRone (BUSPAR) 15 MG tablet Take 1 tablet by mouth 2 (two) times daily. Take daily at 9 AM and at 4 PM 180 tablet 0   Cholecalciferol (VITAMIN D3) 50 MCG (2000 UT) capsule Take 1 capsule (2,000 Units total) by mouth daily. 100 capsule 0   colestipol (COLESTID) 1 g tablet Take 2 tablets (2 g total) by mouth 2 (two) times daily. Take 2 hours away from other medications. 120 tablet 11   cyanocobalamin (VITAMIN B12) 1000 MCG/ML injection Inject 1 mL (1,000 mcg total) into the muscle every 30 (thirty) days. 10 mL 0   cyclobenzaprine (FLEXERIL) 10 MG tablet Take 1 tablet (10 mg total) by mouth 3 (three) times daily as needed. for muscle spams 30 tablet 2   Diclofenac Sodium 2 % SOLN Apply 1 pump twice daily as needed. 112 g 1   estradiol (DOTTI) 0.1 MG/24HR patch Place 1 patch (0.1 mg total) onto the skin 2 (two) times a week. 8 patch 1   estradiol (ESTRACE) 0.1 MG/GM vaginal cream Apply a pea-sized amount of cream to the fingertip and wipe in the front part of the vagina twice weekly 42.5 g 0   fluticasone (FLONASE) 50 MCG/ACT nasal spray Place 2 sprays into both nostrils daily. 16 g 5   furosemide (LASIX) 40 MG tablet Take 1 tablet (40 mg total) by mouth daily. 90 tablet 1   Homeopathic Products (ALLERGY MEDICINE PO) Take 1 tablet by mouth as needed (Takes the Clortabs. Equate Brand).     HYDROcodone-acetaminophen (NORCO/VICODIN) 5-325 MG tablet Take 1 tablet by mouth every 6 (six) hours  as needed. 15 tablet 0    hydrOXYzine (VISTARIL) 25 MG capsule Take 1 capsule (25 mg total) by mouth 2 (two) times daily as needed for anxiety. And sleep, racing thoughts 60 capsule 1   loperamide (IMODIUM) 2 MG capsule Take 2 mg by mouth daily as needed.      omeprazole (PRILOSEC) 40 MG capsule Take 1 capsule (40 mg total) by mouth 2 (two) times daily. OPEN CAPSULE AND SPRINKLE ON APPLESAUCE. 60 capsule 11   Probiotic Product (ALIGN) CHEW Chew 2 tablets by mouth daily. Align pre and probiotic with vitamain C-take one tablet each Align pre and probiotic with Vitamin b12 for immune and energy-take one tablet each     rOPINIRole (REQUIP) 1 MG tablet Take 1 tablet (1 mg total) by mouth at bedtime. 90 tablet 0   sertraline (ZOLOFT) 100 MG tablet Take 1 tablet (100 mg total) by mouth daily with breakfast. 90 tablet 0   SYRINGE-NEEDLE, DISP, 3 ML (B-D 3CC LUER-LOK SYR 25GX5/8") 25G X 5/8" 3 ML MISC USE AS DIRECTED WITH B-12 INJECTION 16 each 2   VITAMIN A PO Take by mouth.     No current facility-administered medications for this visit.     Musculoskeletal: Strength & Muscle Tone:  UTA Gait & Station:  Seated Patient leans: N/A  Psychiatric Specialty Exam: Review of Systems  Psychiatric/Behavioral:  The patient is nervous/anxious.     There were no vitals taken for this visit.There is no height or weight on file to calculate BMI.  General Appearance: Fairly Groomed  Eye Contact:  Fair  Speech:  Clear and Coherent  Volume:  Normal  Mood:  Anxious  Affect:  Congruent  Thought Process:  Goal Directed and Descriptions of Associations: Intact  Orientation:  Full (Time, Place, and Person)  Thought Content: Logical   Suicidal Thoughts:  No  Homicidal Thoughts:  No  Memory:  Immediate;   Fair Recent;   Fair Remote;   Fair  Judgement:  Fair  Insight:  Fair  Psychomotor Activity:  Normal  Concentration:  Concentration: Fair and Attention Span: Fair  Recall:  [employer] of Knowledge: Fair  Language: Fair   Akathisia:  No  Handed:  Right  AIMS (if indicated): not done  Assets:  Communication Skills Desire for Improvement Housing Social Support  ADL's:  Intact  Cognition: WNL  Sleep:  Fair, more  dreams   Screenings: Geneticist, molecular Office Visit from 03/03/2022 in [facility] Office Visit from 01/20/2022 in [facility] Office Visit from 11/17/2021 in [facility] Office Visit from 06/13/2021 in [facility] Office Visit from 05/25/2021 in [facility]  AIMS Total Score 0 0 0 0 0      GAD-7    Flowsheet Row Office Visit from 04/07/2022 in [facility] Office Visit from 01/20/2022 in [facility] Office Visit from 11/17/2021 in [facility] Office Visit from 10/19/2021 in [facility] Office Visit from 09/15/2021 in [facility]  Total GAD-7 Score 18 19 20 19 19       PHQ2-9    Flowsheet Row Video Visit from 08/21/2022 in [facility] Video Visit from 07/04/2022 in [facility] Office Visit from 04/07/2022  in [facility] at [facility] Office Visit from 01/20/2022 in [facility] Office Visit from 11/17/2021 in [facility]  PHQ-2 Total Score 1 0 5 4 5   PHQ-9 Total Score -- -- 19 16 21       Flowsheet Row Video Visit from 09/21/2022 in [facility] Counselor from 08/23/2022 in [facility] Video Visit from 08/21/2022 in [facility]  C-SSRS RISK CATEGORY Low Risk Low Risk Low Risk        Assessment and Plan: [name] is a 57 year old Caucasian female who has a history of PTSD, MDD, social anxiety, alcohol use disorder in remission, chronic pain was evaluated by telemedicine today.  Patient currently struggling with sleep as well as dizziness unspecified, however reports Seroquel has not made her dizzy spells worse and would like to readjust the dosage of Seroquel to target her mood symptoms which she still struggles with.  Plan as noted below.  Plan PTSD-unstable Continue CBT-provided resources in the community, also provided information for Ms. Phyllis Ginger at [phone]. Zoloft 100 mg p.o. daily. BuSpar 15 mg p.o. twice daily-reduced dosage Discontinue Ambien-patient is currently on Seroquel.  MDD in partial remission Increase Seroquel to 50 mg p.o. nightly.  If it does make her dizziness worse will reduce it back. Zoloft 100 mg p.o. daily Hydroxyzine 25 mg p.o. twice daily as needed Continue CBT  Social anxiety disorder-improving Continue CBT  RLS-improving Requip 1 mg p.o. nightly  Alcohol use disorder in remission Sober since February 2022.  Discussed with patient she is on multiple psychotropics as well as CNS depressant medications including oxycodone, propranolol, hydroxyzine, Flexeril which likely is contributing to her dizziness.   Follow-up in clinic in 4 to 6 weeks or sooner in person.  Collaboration of Care: Collaboration of Care: Referral or follow-up with counselor/therapist AEB patient agrees to establish care with a new therapist, provided information for therapist as noted above.  Patient also continued to get sufficient pain management and follow up with her primary care provider for dizzy spells.  Patient/Guardian was advised Release of Information must be obtained prior to any record release in order to collaborate their care with an outside  provider. Patient/Guardian was advised if they have not already done so to contact the registration department to sign all necessary forms in order for [place] to release information regarding their care.   Consent: Patient/Guardian gives verbal consent for treatment and assignment of benefits for services provided during this visit. Patient/Guardian expressed understanding and agreed to proceed.   This note was generated in part or whole with voice recognition software. Voice recognition is usually quite accurate but there are transcription errors that can and very often do occur. I apologize for any typographical errors that were not detected and corrected.     Jomarie Longs, MD 09/21/2022, 4:29 PM

## 2022-09-22 ENCOUNTER — Other Ambulatory Visit: Payer: Self-pay

## 2022-09-22 ENCOUNTER — Telehealth (INDEPENDENT_AMBULATORY_CARE_PROVIDER_SITE_OTHER): Payer: Medicaid Other | Admitting: Physician Assistant

## 2022-09-22 ENCOUNTER — Encounter: Payer: Self-pay | Admitting: Physician Assistant

## 2022-09-22 DIAGNOSIS — J069 Acute upper respiratory infection, unspecified: Secondary | ICD-10-CM

## 2022-09-22 DIAGNOSIS — U071 COVID-19: Secondary | ICD-10-CM

## 2022-09-22 MED ORDER — NIRMATRELVIR/RITONAVIR (PAXLOVID)TABLET
3.0000 | ORAL_TABLET | Freq: Two times a day (BID) | ORAL | 0 refills | Status: AC
Start: 2022-09-22 — End: 2022-09-27
  Filled 2022-09-22: qty 30, 5d supply, fill #0

## 2022-09-22 NOTE — Progress Notes (Signed)
MyChart Audio Visit    Virtual Visit via Audio Note   This format is felt to be most appropriate for this patient at this time. Physical exam was limited by quality of the video and audio technology used for the visit. The visit was started on mychart video, but pt was unable to hear me and the visit was transitioned to audio.  Patient location: home Provider location: [email or web address]  I discussed the limitations of evaluation and management by telemedicine and the availability of in person appointments. The patient expressed understanding and agreed to proceed.  Patient: [name]   DOB: August 16, 1965   57 y.o. Female  MRN: [id number] Visit Date: 09/22/2022  Today's healthcare provider: Alfredia Ferguson, PA-C   Cc. Covid positive  Subjective    HPI   Pt reports testing positive for COVID Tuesday negative, and repeated test yesterday and it was positive. Reports congestion, sore throat, cough, headache, rhinorrhea. Denies SOB, wheezing, fevers. Pt is a former smoker.  Had close contact with someone COVID.   Medications: Outpatient Medications Prior to Visit  Medication Sig   Acetaminophen (TYLENOL 8 HOUR ARTHRITIS PAIN PO) Take 2 tablets by mouth as needed.   busPIRone (BUSPAR) 15 MG tablet Take 1 tablet by mouth 2 (two) times daily. Take daily at 9 AM and at 4 PM   Cholecalciferol (VITAMIN D3) 50 MCG (2000 UT) capsule Take 1 capsule (2,000 Units total) by mouth daily.   colestipol (COLESTID) 1 g tablet Take 2 tablets (2 g total) by mouth 2 (two) times daily. Take 2 hours away from other medications.   cyanocobalamin (VITAMIN B12) 1000 MCG/ML injection Inject 1 mL (1,000 mcg total) into the muscle every 30 (thirty) days.   cyclobenzaprine (FLEXERIL) 10 MG tablet Take 1 tablet (10 mg total) by mouth 3 (three) times daily as needed. for muscle spams   Diclofenac Sodium 2 % SOLN Apply 1 pump twice daily as needed.   estradiol (DOTTI) 0.1 MG/24HR patch Place 1 patch (0.1 mg total) onto  the skin 2 (two) times a week.   estradiol (ESTRACE) 0.1 MG/GM vaginal cream Apply a pea-sized amount of cream to the fingertip and wipe in the front part of the vagina twice weekly   fluticasone (FLONASE) 50 MCG/ACT nasal spray Place 2 sprays into both nostrils daily.   furosemide (LASIX) 40 MG tablet Take 1 tablet (40 mg total) by mouth daily.   Homeopathic Products (ALLERGY MEDICINE PO) Take 1 tablet by mouth as needed (Takes the Clortabs. Equate Brand).   HYDROcodone-acetaminophen (NORCO/VICODIN) 5-325 MG tablet Take 1 tablet by mouth every 6 (six) hours as needed.   hydrOXYzine (VISTARIL) 25 MG capsule Take 1 capsule (25 mg total) by mouth 2 (two) times daily as needed for anxiety. And sleep, racing thoughts   loperamide (IMODIUM) 2 MG capsule Take 2 mg by mouth daily as needed.    omeprazole (PRILOSEC) 40 MG capsule Take 1 capsule (40 mg total) by mouth 2 (two) times daily. OPEN CAPSULE AND SPRINKLE ON APPLESAUCE.   Probiotic Product (ALIGN) CHEW Chew 2 tablets by mouth daily. Align pre and probiotic with vitamain C-take one tablet each Align pre and probiotic with Vitamin b12 for immune and energy-take one tablet each   QUEtiapine (SEROQUEL) 50 MG tablet Take 1 tablet (50 mg total) by mouth at bedtime.   rOPINIRole (REQUIP) 1 MG tablet Take 1 tablet (1 mg total) by mouth at bedtime.   sertraline (ZOLOFT) 100 MG tablet Take 1 tablet (100  mg total) by mouth daily with breakfast.   SYRINGE-NEEDLE, DISP, 3 ML (B-D 3CC LUER-LOK SYR 25GX5/8") 25G X 5/8" 3 ML MISC USE AS DIRECTED WITH B-12 INJECTION   VITAMIN A PO Take by mouth.   No facility-administered medications prior to visit.    Review of Systems  Constitutional:  Positive for fatigue. Negative for fever.  HENT:  Positive for congestion and sore throat.   Respiratory:  Negative for cough and shortness of breath.   Cardiovascular:  Negative for chest pain and leg swelling.  Gastrointestinal:  Negative for abdominal pain.   Neurological:  Positive for headaches. Negative for dizziness.      Objective    There were no vitals taken for this visit.  Physical Exam Constitutional:      Appearance: Normal appearance. She is not ill-appearing.  Neurological:     Mental Status: She is oriented to person, place, and time.  Psychiatric:        Mood and Affect: Mood normal.        Behavior: Behavior normal.        Assessment & Plan     1. Upper respiratory tract infection due to COVID-19 virus Rest, hydrate, otc tylenol/mucinex Advised pros/cons of antiviral  Rx paxlovid, pt aware to d/c seroquel while she is taking.  Reviewed cmp  Return if symptoms worsen or fail to improve.     I discussed the assessment and treatment plan with the patient. The patient was provided an opportunity to ask questions and all were answered. The patient agreed with the plan and demonstrated an understanding of the instructions.   The patient was advised to call back or seek an in-person evaluation if the symptoms worsen or if the condition fails to improve as anticipated.  I provided 8 minutes of non-face-to-face time during this encounter.  I, Alfredia Ferguson, PA-C have reviewed all documentation for this visit. The documentation on  09/22/22   for the exam, diagnosis, procedures, and orders are all accurate and complete.   Alfredia Ferguson, PA-C [facility] at [facility] [phone] (phone) [phone] (fax)  [facility]

## 2022-09-27 ENCOUNTER — Other Ambulatory Visit: Payer: Self-pay | Admitting: Family Medicine

## 2022-09-27 DIAGNOSIS — J0110 Acute frontal sinusitis, unspecified: Secondary | ICD-10-CM

## 2022-09-27 DIAGNOSIS — M545 Low back pain, unspecified: Secondary | ICD-10-CM

## 2022-09-28 ENCOUNTER — Encounter: Payer: Self-pay | Admitting: *Deleted

## 2022-09-28 ENCOUNTER — Other Ambulatory Visit: Payer: Self-pay

## 2022-09-28 ENCOUNTER — Other Ambulatory Visit: Payer: Self-pay | Admitting: Family Medicine

## 2022-09-28 DIAGNOSIS — M545 Low back pain, unspecified: Secondary | ICD-10-CM

## 2022-09-28 DIAGNOSIS — M4800 Spinal stenosis, site unspecified: Secondary | ICD-10-CM

## 2022-09-28 DIAGNOSIS — J0110 Acute frontal sinusitis, unspecified: Secondary | ICD-10-CM

## 2022-09-28 MED FILL — Cyclobenzaprine HCl Tab 10 MG: ORAL | 10 days supply | Qty: 30 | Fill #0 | Status: AC

## 2022-09-28 MED FILL — Hydrocodone-Acetaminophen Tab 5-325 MG: ORAL | 4 days supply | Qty: 15 | Fill #0 | Status: AC

## 2022-09-28 MED FILL — Fluticasone Propionate Nasal Susp 50 MCG/ACT: NASAL | 30 days supply | Qty: 16 | Fill #0 | Status: AC

## 2022-09-28 NOTE — Telephone Encounter (Signed)
Requesting: hydrocodone 5-325mg   Contract: 07/19/21 UDS: 07/19/21 Last Visit: 08/04/22 Next Visit: None Last Refill: 07/24/22 #15 and 0RF   Please Advise

## 2022-09-29 ENCOUNTER — Other Ambulatory Visit: Payer: Self-pay

## 2022-10-02 ENCOUNTER — Encounter: Payer: Self-pay | Admitting: Family Medicine

## 2022-10-02 DIAGNOSIS — G8929 Other chronic pain: Secondary | ICD-10-CM

## 2022-10-02 NOTE — Progress Notes (Signed)
Knee MRI shows a meniscus tear and cartilage loss and bone bruising on the outside or the lateral part of the knee.  This is causing your pain and if we cannot get you better with the cortisone shots and strengthening exercises it may be reasonable for you to consider surgery such as even a knee replacement.  Taking the weight off of your knee by using a cane would be helpful.  Would you like me to refer you to orthopedic surgery to discuss your surgical options for your knee?  Otherwise you can return to clinic with me and we will talk about the MRI and options in further detail.

## 2022-10-03 ENCOUNTER — Telehealth: Payer: Self-pay | Admitting: Family Medicine

## 2022-10-03 NOTE — Telephone Encounter (Signed)
Pt called stating that she already has an appt with Starling Manns, MD regarding her MRI results regarding her neck. Pt was wondering if she should need to see [facility] considering she is already seeing someone for her neck issues that Dr. Laury Axon referred her to begin with. Please Advise.

## 2022-10-05 ENCOUNTER — Encounter: Payer: Self-pay | Admitting: Orthopedic Surgery

## 2022-10-05 ENCOUNTER — Other Ambulatory Visit: Payer: Self-pay

## 2022-10-05 ENCOUNTER — Ambulatory Visit: Payer: Medicaid Other | Admitting: Orthopedic Surgery

## 2022-10-05 VITALS — BP 106/72 | HR 69 | Ht 62.0 in | Wt 161.0 lb

## 2022-10-05 DIAGNOSIS — M542 Cervicalgia: Secondary | ICD-10-CM

## 2022-10-05 NOTE — Progress Notes (Signed)
Orthopedic Spine Surgery Office Note  Assessment: Patient is a 57 y.o. female with neck pain that radiates into the right shoulder.  The majority of her pain is in the neck though.  Has multiple levels with degenerative disc disease   Plan: -Patient has tried PT, Tylenol, cervical injections, narcotics, muscle relaxers -I told her that cervical spine surgery does not reliably relieve neck pain especially for degenerative disc disease so I did not recommend any operative intervention at this time.  I told her that neck surgery can more predictably relieve the radiating arm pain which she has into the right shoulder.  However, she says that this is not consistent and is not nearly as significant of a pain as her neck -Recommended she continue with pain management -Patient should return to office on an as-needed basis   Patient expressed understanding of the plan and all questions were answered to the patient's satisfaction.   ___________________________________________________________________________   History:  Patient is a 57 y.o. female who presents today for cervical spine.  Patient has had several years of neck pain that has gotten progressively worse with time.  She said the pain sometimes radiates into the right shoulder but for the most part is localized the neck.  She feels pain on a daily basis.  She says that this pain started from her job where she had to do frequent neck movement and was looking down most of the day.  She said she has seen previous spine surgeons who told her that they have to remove part of her skull to address her symptoms.  She said she got a second opinion in the same procedure with removal of part of the skull was recommended.  She said it has been a while since she has seen a Writer.  She does not have any pain radiating past the shoulder on the right side.  No pain radiating into the left upper extremity.   Weakness: Denies Difficulty with fine motor  skills (e.g., buttoning shirts, handwriting): Denies Symptoms of imbalance: Yes, has had dizziness that she experiences that results in her feeling off balance.  She gets this dizziness even when in a seated position.  She said it only last for short period of time and then resolves Paresthesias and numbness: Yes, sometimes the fingers in her right hand go numb but no consistent numbness or paresthesias in either upper extremity Bowel or bladder incontinence: Denies Saddle anesthesia: Denies  Treatments tried: PT, Tylenol, cervical injections, narcotics, muscle relaxers  Review of systems: Denies fevers and chills, night sweats, unexplained weight loss, history of cancer.  Has had pain that wakes her at night  Past medical history: PTSD ROS Lumbar DDD GERD Depression/anxiety Irritable bowel syndrome Chronic pain HTN  Allergies: Lamictal, penicillin, codeine  Past surgical history:  Gastric bypass Perforated ulcer surgery Hysterectomy  Social history: Denies use of nicotine product (smoking, vaping, patches, smokeless) Alcohol use: Denies Denies recreational drug use Has not been working since 2022.  Trying to get disability   Physical Exam:  BMI of 29.5  General: no acute distress, appears stated age Neurologic: alert, answering questions appropriately, following commands Respiratory: unlabored breathing on room air, symmetric chest rise Psychiatric: appropriate affect, normal cadence to speech   MSK (spine):  -Strength exam      Left  Right Grip strength                5/5  5/5 Interosseus   5/5   5/5 Wrist extension  5/5  5/5 Wrist flexion   5/5  5/5 Elbow flexion   5/5  5/5 Deltoid    5/5  5/5  -Sensory exam    Sensation intact to light touch in C5-T1 nerve distributions of bilateral upper extremities  -Brachioradialis DTR: 2/4 on the left, 2/4 on the right -Biceps DTR: 2/4 on the left, 2/4 on the right -Achilles DTR: 2/4 on the left, 2/4 on the  right -Patellar tendon DTR: 2/4 on the left, 2/4 on the right  -Spurling: Negative bilaterally -Hoffman sign: Negative bilaterally -Clonus: No beats bilaterally -Interosseous wasting: None seen -Grip and release test: Negative -Romberg: Negative -Gait: Normal  Left shoulder exam: No pain through range of motion Right shoulder exam: No pain through range of motion  Tinel's at wrist: Negative bilaterally Phalen's at wrist: Negative bilaterally Durkan's: Negative bilaterally  Tinel's at elbow: Negative bilaterally  Imaging: XRs of the cervical spine from 10/05/2022 were independently reviewed and interpreted, showing disc height loss at C3/4, C4/5, C5/6, and C6/7.  Grade 1 spondylolisthesis seen at C7/T1.  No evidence of instability on flexion/extension views.  No fracture or dislocation seen.  Lordotic alignment.  MRI of the cervical spine from 09/10/2022 was independently reviewed and interpreted, showing no significant central stenosis.  Foraminal stenosis seen at C4/5 on the right.  Bilateral foraminal stenosis at C6/7.  No T2 cord signal change.   Patient name: [name] Patient MRN: [id number] Date of visit: 10/05/22

## 2022-10-05 NOTE — Telephone Encounter (Signed)
Pt called and made aware

## 2022-10-14 ENCOUNTER — Other Ambulatory Visit: Payer: Self-pay

## 2022-10-15 ENCOUNTER — Other Ambulatory Visit: Payer: Self-pay

## 2022-10-19 ENCOUNTER — Other Ambulatory Visit: Payer: Self-pay

## 2022-10-19 ENCOUNTER — Other Ambulatory Visit: Payer: Self-pay | Admitting: Family Medicine

## 2022-10-19 DIAGNOSIS — G2581 Restless legs syndrome: Secondary | ICD-10-CM

## 2022-10-19 MED ORDER — ROPINIROLE HCL 1 MG PO TABS
1.0000 mg | ORAL_TABLET | Freq: Every day | ORAL | 1 refills | Status: DC
Start: 2022-10-19 — End: 2023-04-16
  Filled 2022-10-19: qty 90, 90d supply, fill #0
  Filled 2023-01-18: qty 90, 90d supply, fill #1

## 2022-10-20 ENCOUNTER — Other Ambulatory Visit: Payer: Self-pay | Admitting: Family Medicine

## 2022-10-20 ENCOUNTER — Other Ambulatory Visit: Payer: Self-pay

## 2022-10-20 MED ORDER — ESTRADIOL 0.1 MG/24HR TD PTTW
1.0000 | MEDICATED_PATCH | TRANSDERMAL | 1 refills | Status: DC
  Filled 2022-10-20: qty 8, 28d supply, fill #0
  Filled 2022-11-22: qty 8, 28d supply, fill #1

## 2022-10-23 ENCOUNTER — Other Ambulatory Visit: Payer: Self-pay | Admitting: Psychiatry

## 2022-10-23 ENCOUNTER — Other Ambulatory Visit: Payer: Self-pay

## 2022-10-23 ENCOUNTER — Ambulatory Visit (INDEPENDENT_AMBULATORY_CARE_PROVIDER_SITE_OTHER): Payer: Medicaid Other | Admitting: Orthopaedic Surgery

## 2022-10-23 DIAGNOSIS — G8929 Other chronic pain: Secondary | ICD-10-CM

## 2022-10-23 DIAGNOSIS — F4310 Post-traumatic stress disorder, unspecified: Secondary | ICD-10-CM

## 2022-10-23 DIAGNOSIS — M1711 Unilateral primary osteoarthritis, right knee: Secondary | ICD-10-CM | POA: Diagnosis not present

## 2022-10-23 DIAGNOSIS — M25561 Pain in right knee: Secondary | ICD-10-CM | POA: Diagnosis not present

## 2022-10-23 DIAGNOSIS — F4010 Social phobia, unspecified: Secondary | ICD-10-CM

## 2022-10-23 MED ORDER — HYDROXYZINE PAMOATE 25 MG PO CAPS
25.0000 mg | ORAL_CAPSULE | Freq: Two times a day (BID) | ORAL | 1 refills | Status: DC | PRN
  Filled 2022-10-23: qty 60, 30d supply, fill #0
  Filled 2023-01-10: qty 60, 30d supply, fill #1

## 2022-10-23 NOTE — Progress Notes (Signed)
The patient is a pleasant and active 57 year old female sent from Dr. Clementeen Graham to evaluate and treat known arthritis of her right knee.  She has tried and failed all forms conservative treatment.  She has had injections in her knee by Dr. Denyse Amass which has helped greatly in the past but the more recent injection just a month or so ago has not helped as much.  A MRI was obtained of her right knee and it did show areas of full-thickness cartilage loss of the weightbearing aspect of the lateral compartment of her right knee.  There is also degenerative meniscal tearing as well.  At this point her knee pain is daily and is detrimentally affecting her mobility, her actives of daily living and her quality of life.  She cannot take anti-inflammatories due to previous gastric bypass surgery.  She tries to stay active.  She is sent to [place] to discuss knee replacement surgery.  I was able to review all of her medications and past medical history within epic.  She currently denies any headache, chest pain, shortness of breath, fever, chills, nausea, vomiting.  Examination of her right knee shows slight valgus malalignment.  There is significant lateral joint line tenderness but no instability on exam.  The range of motion is full.  I was able to review the plain films of her knee as well as the MRI findings.  She does have areas of full-thickness cartilage loss about the lateral apartment of her knee and some valgus malalignment.  There is also degenerative lateral meniscal tearing.  At this point we have recommended knee replacement surgery.  I described in detail what this type of surgery involves.  I discussed the risks and benefits of surgery and what to expect from an intraoperative and postoperative standpoint.  All questions and concerns were answered and addressed.  Will work on getting this scheduled.

## 2022-10-27 MED FILL — Fluticasone Propionate Nasal Susp 50 MCG/ACT: NASAL | 30 days supply | Qty: 16 | Fill #1 | Status: AC

## 2022-11-01 ENCOUNTER — Encounter: Payer: Self-pay | Admitting: Psychiatry

## 2022-11-01 ENCOUNTER — Other Ambulatory Visit: Payer: Self-pay

## 2022-11-01 ENCOUNTER — Ambulatory Visit (INDEPENDENT_AMBULATORY_CARE_PROVIDER_SITE_OTHER): Payer: Medicaid Other | Admitting: Psychiatry

## 2022-11-01 VITALS — BP 128/81 | HR 69 | Temp 97.8°F | Ht 62.0 in | Wt 162.0 lb

## 2022-11-01 DIAGNOSIS — G2581 Restless legs syndrome: Secondary | ICD-10-CM

## 2022-11-01 DIAGNOSIS — F4010 Social phobia, unspecified: Secondary | ICD-10-CM

## 2022-11-01 DIAGNOSIS — F3341 Major depressive disorder, recurrent, in partial remission: Secondary | ICD-10-CM | POA: Diagnosis not present

## 2022-11-01 DIAGNOSIS — F4310 Post-traumatic stress disorder, unspecified: Secondary | ICD-10-CM

## 2022-11-01 DIAGNOSIS — F1021 Alcohol dependence, in remission: Secondary | ICD-10-CM

## 2022-11-01 MED ORDER — SERTRALINE HCL 100 MG PO TABS
ORAL_TABLET | ORAL | 0 refills | Status: DC
Start: 2022-11-01 — End: 2022-12-15
  Filled 2022-11-01 – 2022-11-13 (×3): qty 53, 30d supply, fill #0

## 2022-11-01 NOTE — Progress Notes (Signed)
BH MD OP Progress Note  11/01/2022 1:07 PM [name]  MRN:  [id number]  Chief Complaint:  Chief Complaint  Patient presents with   Follow-up   Depression   Anxiety   Medication Refill   HPI: [name] is a 57 year old Caucasian female, married, unemployed, has a history of PTSD, MDD, social anxiety disorder, RLS, alcohol use disorder in remission, chronic pain, [place] was evaluated in office today.  Patient today reports she continues to struggle with a lot of physical problems, dizziness, pain especially of her right sided knee.  Patient reports she will need knee surgery soon.  She is currently following up with orthopedic surgeon. She continues to be on pain medications however has been trying to limit use.  She reports she continues to struggle with sadness, low motivation, low energy, concentration problems.  She reports she also feels anxious, worries about everything all the time.  She reports she struggles with her financial status.  She does not have a job and she was denied disability.  She reports that does worry her since she feels she is a burden on her family.  She reports her dad tries to help her out.  Her dad brought her to this appointment today.  Patient reports sleep is overall okay since she is on the Seroquel.  Denies any side effects from the current dosage of Seroquel.  She denies any suicidality, homicidality or perceptual disturbances.  Patient was able to get a psychotherapy appointment with our incoming therapist on 11/07/2022.  She is motivated to keep it.  Patient currently denies any side effects to her medications including the sertraline, BuSpar, hydroxyzine as needed.   Visit Diagnosis:    ICD-10-CM   1. PTSD (post-traumatic stress disorder)  F43.10 sertraline (ZOLOFT) 100 MG tablet    2. Recurrent major depressive disorder, in partial remission (HCC)  F33.41 sertraline (ZOLOFT) 100 MG tablet    3. Social anxiety disorder   F40.10 sertraline (ZOLOFT) 100 MG tablet    4. RLS (restless legs syndrome)  G25.81     5. Alcohol use disorder, severe, in sustained remission (HCC)  F10.21       Past Psychiatric History: I have past psychiatric history from progress note on 04/04/2021.  Trials of medications-Wellbutrin, Abilify, Zoloft, Xanax, Cymbalta, Pristiq, BuSpar, Deplin, Lexapro, Valium, Viibryd, Trintellix, Lamictal.  Patient had psychological testing completed-05/05/2021-Dr. Rodenbough-does not meet criteria for ADHD.  Patient was evaluated on 06/01/2022-patient to continue psychiatric treatment for PTSD, depression, sleep.  Past Medical History:  Past Medical History:  Diagnosis Date   Allergy    Anemia    Anxiety    Asthma    Depression    GERD (gastroesophageal reflux disease)    Heart murmur    from childhood   Hiatal hernia    Hypertension    Peptic ulcer    PTSD (post-traumatic stress disorder)    UTI (urinary tract infection)     Past Surgical History:  Procedure Laterality Date   ABDOMINAL HYSTERECTOMY     fibroids   BIOPSY  07/11/2018   Procedure: BIOPSY;  Surgeon: Meryl Dare, MD;  Location: [facility] ENDOSCOPY;  Service: Endoscopy;;   COLONOSCOPY  2014   ESOPHAGOGASTRODUODENOSCOPY (EGD) WITH PROPOFOL N/A 07/11/2018   Procedure: ESOPHAGOGASTRODUODENOSCOPY (EGD) WITH PROPOFOL;  Surgeon: Meryl Dare, MD;  Location: [facility] ENDOSCOPY;  Service: Endoscopy;  Laterality: N/A;   GASTRIC BYPASS  2007   HEMOSTASIS CLIP PLACEMENT  07/11/2018   Procedure: HEMOSTASIS CLIP PLACEMENT;  Surgeon: Meryl Dare, MD;  Location: [facility] ENDOSCOPY;  Service: Endoscopy;;   LAPAROTOMY N/A 03/08/2018   Procedure: EXPLORATORY LAPAROTOMY, REPAIR AND PATCH CLOSURE OF PERFORATED ULCER;  Surgeon: Violeta Gelinas, MD;  Location: [facility] OR;  Service: General;  Laterality: N/A;   TUBAL LIGATION      Family Psychiatric History: I have reviewed family psychiatric history from progress note on 04/04/2021.  Family History:  Family  History  Problem Relation Age of Onset   Stroke Mother    Alzheimer's disease Mother    Hypertension Father    Cancer Father        Prostate   Alcohol abuse Sister    Depression Sister    Alcohol abuse Brother    Depression Brother    Diabetes Maternal Grandfather    Liver disease Maternal Grandfather    Cancer Maternal Grandfather        Prostate   Diabetes Maternal Grandmother    Alzheimer's disease Maternal Grandmother    Diabetes Paternal Grandfather    Cancer Paternal Grandfather        Lung, Prostate   Diabetes Paternal Grandmother    Alzheimer's disease Paternal Grandmother    Colon cancer Neg Hx    Esophageal cancer Neg Hx    Pancreatic cancer Neg Hx    Stomach cancer Neg Hx    Colon polyps Neg Hx    Rectal cancer Neg Hx     Social History: I have reviewed social history from progress note on 04/04/2021. Social History   Socioeconomic History   Marital status: Married    Spouse name: mark   Number of children: 0   Years of education: Not on file   Highest education level: High school graduate  Occupational History    Employer: [employer]    Comment: [employer]  Tobacco Use   Smoking status: Former    Current packs/day: 0.00    Types: Cigarettes    Quit date: 01/24/1987    Years since quitting: 35.7   Smokeless tobacco: Never  Vaping Use   Vaping status: Former   Substances: Flavoring   Devices: has vaped with flavor but no nicotine. Been 8 years  Substance and Sexual Activity   Alcohol use: Not Currently    Alcohol/week: 35.0 standard drinks of alcohol    Types: 35 Cans of beer per week   Drug use: No   Sexual activity: Not Currently  Other Topics Concern   Not on file  Social History Narrative   Regular exercise--no (because of knees)   Social Determinants of Health   Financial Resource Strain: Not on file  Food Insecurity: Not on file  Transportation Needs: Not on file  Physical Activity: Not on file  Stress: Not on file  Social  Connections: Not on file    Allergies:  Allergies  Allergen Reactions   Lamictal [Lamotrigine] Rash   Penicillins Other (See Comments)    Did it involve swelling of the face/tongue/throat, SOB, or low BP? Unk Did it involve sudden or severe rash/hives, skin peeling, or any reaction on the inside of your mouth or nose? Unk Did you need to seek medical attention at a hospital or doctor's office? Unk When did it last happen? Was 57 years old; reaction not recalled     If all above answers are "NO", may proceed with cephalosporin use.    Codeine Itching    Crazy dreams and itching-pt said she can take it with food    Metabolic Disorder  Labs: Lab Results  Component Value Date   HGBA1C 5.3 05/24/2022   MPG 105.41 05/24/2022   Lab Results  Component Value Date   PROLACTIN 16.9 05/24/2022   Lab Results  Component Value Date   CHOL 174 04/07/2022   TRIG 69 04/07/2022   HDL 66 04/07/2022   CHOLHDL 2.6 04/07/2022   VLDL 9.0 07/19/2021   LDLCALC 92 04/07/2022   LDLCALC 70 07/19/2021   Lab Results  Component Value Date   TSH 3.15 08/04/2022   TSH 1.87 04/07/2022    Therapeutic Level Labs: No results found for: "LITHIUM" No results found for: "VALPROATE" No results found for: "CBMZ"  Current Medications: Current Outpatient Medications  Medication Sig Dispense Refill   Acetaminophen (TYLENOL 8 HOUR ARTHRITIS PAIN PO) Take 2 tablets by mouth as needed.     busPIRone (BUSPAR) 15 MG tablet Take 1 tablet by mouth 2 (two) times daily. Take daily at 9 AM and at 4 PM 180 tablet 0   Cholecalciferol (VITAMIN D3) 50 MCG (2000 UT) capsule Take 1 capsule (2,000 Units total) by mouth daily. 100 capsule 0   colestipol (COLESTID) 1 g tablet Take 2 tablets (2 g total) by mouth 2 (two) times daily. Take 2 hours away from other medications. 120 tablet 11   cyanocobalamin (VITAMIN B12) 1000 MCG/ML injection Inject 1 mL (1,000 mcg total) into the muscle every 30 (thirty) days. 10 mL 0    cyclobenzaprine (FLEXERIL) 10 MG tablet Take 1 tablet (10 mg total) by mouth 3 (three) times daily as needed. for muscle spams 30 tablet 2   Diclofenac Sodium 2 % SOLN Apply 1 pump twice daily as needed. 112 g 1   estradiol (DOTTI) 0.1 MG/24HR patch Place 1 patch (0.1 mg total) onto the skin 2 (two) times a week. 8 patch 1   estradiol (ESTRACE) 0.1 MG/GM vaginal cream Apply a pea-sized amount of cream to the fingertip and wipe in the front part of the vagina twice weekly 42.5 g 0   fluticasone (FLONASE) 50 MCG/ACT nasal spray Place 2 sprays into both nostrils daily. 16 g 5   furosemide (LASIX) 40 MG tablet Take 1 tablet (40 mg total) by mouth daily. 90 tablet 1   Homeopathic Products (ALLERGY MEDICINE PO) Take 1 tablet by mouth as needed (Takes the Clortabs. Equate Brand).     HYDROcodone-acetaminophen (NORCO/VICODIN) 5-325 MG tablet Take 1 tablet by mouth every 6 (six) hours as needed. 15 tablet 0   hydrOXYzine (VISTARIL) 25 MG capsule Take 1 capsule (25 mg total) by mouth 2 (two) times daily as needed for anxiety. And sleep, racing thoughts 60 capsule 1   loperamide (IMODIUM) 2 MG capsule Take 2 mg by mouth daily as needed.      omeprazole (PRILOSEC) 40 MG capsule Take 1 capsule (40 mg total) by mouth 2 (two) times daily. OPEN CAPSULE AND SPRINKLE ON APPLESAUCE. 60 capsule 11   Probiotic Product (ALIGN) CHEW Chew 2 tablets by mouth daily. Align pre and probiotic with vitamain C-take one tablet each Align pre and probiotic with Vitamin b12 for immune and energy-take one tablet each     QUEtiapine (SEROQUEL) 50 MG tablet Take 1 tablet (50 mg total) by mouth at bedtime. 30 tablet 1   rOPINIRole (REQUIP) 1 MG tablet Take 1 tablet (1 mg total) by mouth at bedtime. 90 tablet 1   sertraline (ZOLOFT) 100 MG tablet Take 1.5 tablets (150 mg total) by mouth daily for 15 days, THEN 2 tablets (200  mg total) daily for 15 days. 53 tablet 0   SYRINGE-NEEDLE, DISP, 3 ML (B-D 3CC LUER-LOK SYR 25GX5/8") 25G X 5/8" 3  ML MISC USE AS DIRECTED WITH B-12 INJECTION 16 each 2   VITAMIN A PO Take by mouth.     No current facility-administered medications for this visit.     Musculoskeletal: Strength & Muscle Tone: within normal limits Gait & Station:  slow due to knee pain Patient leans: N/A  Psychiatric Specialty Exam: Review of Systems  Psychiatric/Behavioral:  Positive for decreased concentration and dysphoric mood. The patient is nervous/anxious.     Blood pressure 128/81, pulse 69, temperature 97.8 F (36.6 C), temperature source Skin, height 5\' 2"  (1.575 m), weight 162 lb (73.5 kg).Body mass index is 29.63 kg/m.  General Appearance: Casual  Eye Contact:  Fair  Speech:  Clear and Coherent  Volume:  Normal  Mood:  Anxious and Depressed  Affect:  Tearful  Thought Process:  Goal Directed and Descriptions of Associations: Intact  Orientation:  Full (Time, Place, and Person)  Thought Content: Logical   Suicidal Thoughts:  No  Homicidal Thoughts:  No  Memory:  Immediate;   Fair Recent;   Fair Remote;   Fair  Judgement:  Fair  Insight:  Fair  Psychomotor Activity:  Normal  Concentration:  Concentration: Fair and Attention Span: Fair  Recall:  [employer] of Knowledge: Fair  Language: Fair  Akathisia:  No  Handed:  Right  AIMS (if indicated): done  Assets:  Communication Skills Desire for Improvement Housing Social Support Transportation  ADL's:  Intact  Cognition: WNL  Sleep:  Fair   Screenings: Geneticist, molecular Office Visit from 11/01/2022 in [facility] Office Visit from 03/03/2022 in [facility] Office Visit from 01/20/2022 in [facility] Office Visit from 11/17/2021 in [facility] Office Visit from 06/13/2021 in [facility]  AIMS Total Score 0 0 0 0 0      GAD-7     Flowsheet Row Office Visit from 11/01/2022 in [facility] Office Visit from 04/07/2022 in [facility] Office Visit from 01/20/2022 in [facility] Office Visit from 11/17/2021 in [facility] Office Visit from 10/19/2021 in [facility]  Total GAD-7 Score 20 18 19 20 19       PHQ2-9    Flowsheet Row Office Visit from 11/01/2022 in [facility] Video Visit from 08/21/2022 in [facility] Video Visit from 07/04/2022 in [facility] Office Visit from 04/07/2022 in [facility] at [facility] Office Visit from 01/20/2022 in [facility]  PHQ-2 Total Score 5 1 0 5 4  PHQ-9 Total Score 18 -- -- 19 16      Flowsheet Row Office Visit from 11/01/2022 in [facility] Video Visit from 09/21/2022 in [facility] Counselor from 08/23/2022 in [facility] at [facility] RISK CATEGORY Low Risk Low Risk Low Risk        Assessment and Plan: [name] is a 57 year old Caucasian female who has a history of PTSD, MDD, social anxiety, alcohol use disorder in remission,  chronic pain was evaluated in office today.  Patient continues to have multiple medical problems including chronic pain, dizziness as well as mood symptoms, will benefit from medication management to address mood symptoms as well as reestablishing care with a therapist for psychotherapy.  Plan as noted below.  Plan MDD-unstable Continue Seroquel 50 mg p.o. nightly.  Will not increase the current dosage for now due to dizziness as well as potential  interaction with opioid medications. Increase Zoloft to 150 mg p.o. daily for 15 days and increase to 200 mg p.o. daily after that Hydroxyzine 25 mg p.o. twice daily as needed for severe anxiety attacks Patient has reestablish care with new therapist-Ms.Renee Ramus.  Social anxiety disorder-improving Patient to continue CBT Continue hydroxyzine 25 mg twice a day as needed  RLS-improving Requip 1 mg p.o. nightly  Alcohol use disorder in remission Sober since 2022 February.   Patient will need sufficient pain management.  She has upcoming knee surgery.  Collaboration of Care: Collaboration of Care: Referral or follow-up with counselor/therapist AEB patient encouraged to continue CBT.  Patient/Guardian was advised Release of Information must be obtained prior to any record release in order to collaborate their care with an outside provider. Patient/Guardian was advised if they have not already done so to contact the registration department to sign all necessary forms in order for [place] to release information regarding their care.   Consent: Patient/Guardian gives verbal consent for treatment and assignment of benefits for services provided during this visit. Patient/Guardian expressed understanding and agreed to proceed.   Follow-up in clinic in 4 weeks or sooner if needed.  This note was generated in part or whole with voice recognition software. Voice recognition is usually quite accurate but there are transcription errors that can and very often do occur. I apologize for any typographical errors that were not detected and corrected.    Jomarie Longs, MD 11/01/2022, 1:07 PM

## 2022-11-07 ENCOUNTER — Other Ambulatory Visit: Payer: Self-pay

## 2022-11-07 ENCOUNTER — Ambulatory Visit (INDEPENDENT_AMBULATORY_CARE_PROVIDER_SITE_OTHER): Payer: Medicaid Other | Admitting: Licensed Clinical Social Worker

## 2022-11-07 DIAGNOSIS — F4310 Post-traumatic stress disorder, unspecified: Secondary | ICD-10-CM

## 2022-11-07 DIAGNOSIS — Z636 Dependent relative needing care at home: Secondary | ICD-10-CM

## 2022-11-07 DIAGNOSIS — F1021 Alcohol dependence, in remission: Secondary | ICD-10-CM | POA: Diagnosis not present

## 2022-11-07 DIAGNOSIS — F332 Major depressive disorder, recurrent severe without psychotic features: Secondary | ICD-10-CM | POA: Diagnosis not present
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
## 2022-11-10 ENCOUNTER — Other Ambulatory Visit: Payer: Self-pay

## 2022-11-13 ENCOUNTER — Encounter: Payer: Self-pay | Admitting: Licensed Clinical Social Worker

## 2022-11-13 ENCOUNTER — Other Ambulatory Visit: Payer: Self-pay

## 2022-11-13 ENCOUNTER — Ambulatory Visit (INDEPENDENT_AMBULATORY_CARE_PROVIDER_SITE_OTHER): Payer: Medicaid Other | Admitting: Licensed Clinical Social Worker

## 2022-11-13 DIAGNOSIS — F4310 Post-traumatic stress disorder, unspecified: Secondary | ICD-10-CM | POA: Diagnosis not present
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
## 2022-11-13 NOTE — Progress Notes (Deleted)
BH MD/PA/NP OP Progress Note  11/13/2022 9:10 AM Vennela Forte  MRN:  [id number]  Chief Complaint: No chief complaint on file.  HPI: *** Visit Diagnosis: No diagnosis found.  Past Psychiatric History: ***  Past Medical History:  Past Medical History:  Diagnosis Date   Allergy    Anemia    Anxiety    Asthma    Depression    GERD (gastroesophageal reflux disease)    Heart murmur    from childhood   Hiatal hernia    Hypertension    Peptic ulcer    PTSD (post-traumatic stress disorder)    UTI (urinary tract infection)     Past Surgical History:  Procedure Laterality Date   ABDOMINAL HYSTERECTOMY     fibroids   BIOPSY  07/11/2018   Procedure: BIOPSY;  Surgeon: Meryl Dare, MD;  Location: [facility] ENDOSCOPY;  Service: Endoscopy;;   COLONOSCOPY  2014   ESOPHAGOGASTRODUODENOSCOPY (EGD) WITH PROPOFOL N/A 07/11/2018   Procedure: ESOPHAGOGASTRODUODENOSCOPY (EGD) WITH PROPOFOL;  Surgeon: Meryl Dare, MD;  Location: [facility] ENDOSCOPY;  Service: Endoscopy;  Laterality: N/A;   GASTRIC BYPASS  2007   HEMOSTASIS CLIP PLACEMENT  07/11/2018   Procedure: HEMOSTASIS CLIP PLACEMENT;  Surgeon: Meryl Dare, MD;  Location: [facility] ENDOSCOPY;  Service: Endoscopy;;   LAPAROTOMY N/A 03/08/2018   Procedure: EXPLORATORY LAPAROTOMY, REPAIR AND PATCH CLOSURE OF PERFORATED ULCER;  Surgeon: Violeta Gelinas, MD;  Location: [facility] OR;  Service: General;  Laterality: N/A;   TUBAL LIGATION      Family Psychiatric History: ***  Family History:  Family History  Problem Relation Age of Onset   Stroke Mother    Alzheimer's disease Mother    Hypertension Father    Cancer Father        Prostate   Alcohol abuse Sister    Depression Sister    Alcohol abuse Brother    Depression Brother    Diabetes Maternal Grandfather    Liver disease Maternal Grandfather    Cancer Maternal Grandfather        Prostate   Diabetes Maternal Grandmother    Alzheimer's disease Maternal Grandmother    Diabetes Paternal  Grandfather    Cancer Paternal Grandfather        Lung, Prostate   Diabetes Paternal Grandmother    Alzheimer's disease Paternal Grandmother    Colon cancer Neg Hx    Esophageal cancer Neg Hx    Pancreatic cancer Neg Hx    Stomach cancer Neg Hx    Colon polyps Neg Hx    Rectal cancer Neg Hx     Social History:  Social History   Socioeconomic History   Marital status: Married    Spouse name: mark   Number of children: 0   Years of education: Not on file   Highest education level: High school graduate  Occupational History    Employer: [employer]    Comment: [employer]  Tobacco Use   Smoking status: Former    Current packs/day: 0.00    Types: Cigarettes    Quit date: 01/24/1987    Years since quitting: 35.8   Smokeless tobacco: Never  Vaping Use   Vaping status: Former   Substances: Flavoring   Devices: has vaped with flavor but no nicotine. Been 8 years  Substance and Sexual Activity   Alcohol use: Not Currently    Alcohol/week: 35.0 standard drinks of alcohol    Types: 35 Cans of beer per week   Drug use: No  Sexual activity: Not Currently  Other Topics Concern   Not on file  Social History Narrative   Regular exercise--no (because of knees)   Social Determinants of Health   Financial Resource Strain: High Risk (11/07/2022)   Overall Financial Resource Strain (CARDIA)    Difficulty of Paying Living Expenses: Very hard  Food Insecurity: Food Insecurity Present (11/07/2022)   Hunger Vital Sign    Worried About Running Out of Food in the Last Year: Sometimes true    Ran Out of Food in the Last Year: Sometimes true  Transportation Needs: No Transportation Needs (11/07/2022)   PRAPARE - Administrator, Civil Service (Medical): No    Lack of Transportation (Non-Medical): No  Physical Activity: Not on file  Stress: Not on file  Social Connections: Moderately Integrated (11/07/2022)   Social Connection and Isolation Panel [NHANES]    Frequency of  Communication with Friends and Family: More than three times a week    Frequency of Social Gatherings with Friends and Family: More than three times a week    Attends Religious Services: More than 4 times per year    Active Member of [employer] or Organizations: No    Attends Banker Meetings: Never    Marital Status: Married    Allergies:  Allergies  Allergen Reactions   Lamictal [Lamotrigine] Rash   Penicillins Other (See Comments)    Did it involve swelling of the face/tongue/throat, SOB, or low BP? Unk Did it involve sudden or severe rash/hives, skin peeling, or any reaction on the inside of your mouth or nose? Unk Did you need to seek medical attention at a hospital or doctor's office? Unk When did it last happen? Was 57 years old; reaction not recalled     If all above answers are "NO", may proceed with cephalosporin use.    Codeine Itching    Crazy dreams and itching-pt said she can take it with food    Metabolic Disorder Labs: Lab Results  Component Value Date   HGBA1C 5.3 05/24/2022   MPG 105.41 05/24/2022   Lab Results  Component Value Date   PROLACTIN 16.9 05/24/2022   Lab Results  Component Value Date   CHOL 174 04/07/2022   TRIG 69 04/07/2022   HDL 66 04/07/2022   CHOLHDL 2.6 04/07/2022   VLDL 9.0 07/19/2021   LDLCALC 92 04/07/2022   LDLCALC 70 07/19/2021   Lab Results  Component Value Date   TSH 3.15 08/04/2022   TSH 1.87 04/07/2022    Therapeutic Level Labs: No results found for: "LITHIUM" No results found for: "VALPROATE" No results found for: "CBMZ"  Current Medications: Current Outpatient Medications  Medication Sig Dispense Refill   Acetaminophen (TYLENOL 8 HOUR ARTHRITIS PAIN PO) Take 2 tablets by mouth as needed.     busPIRone (BUSPAR) 15 MG tablet Take 1 tablet by mouth 2 (two) times daily. Take daily at 9 AM and at 4 PM 180 tablet 0   Cholecalciferol (VITAMIN D3) 50 MCG (2000 UT) capsule Take 1 capsule (2,000 Units total) by  mouth daily. 100 capsule 0   colestipol (COLESTID) 1 g tablet Take 2 tablets (2 g total) by mouth 2 (two) times daily. Take 2 hours away from other medications. 120 tablet 11   cyanocobalamin (VITAMIN B12) 1000 MCG/ML injection Inject 1 mL (1,000 mcg total) into the muscle every 30 (thirty) days. 10 mL 0   cyclobenzaprine (FLEXERIL) 10 MG tablet Take 1 tablet (10 mg total) by mouth  3 (three) times daily as needed. for muscle spams 30 tablet 2   Diclofenac Sodium 2 % SOLN Apply 1 pump twice daily as needed. 112 g 1   estradiol (DOTTI) 0.1 MG/24HR patch Place 1 patch (0.1 mg total) onto the skin 2 (two) times a week. 8 patch 1   estradiol (ESTRACE) 0.1 MG/GM vaginal cream Apply a pea-sized amount of cream to the fingertip and wipe in the front part of the vagina twice weekly 42.5 g 0   fluticasone (FLONASE) 50 MCG/ACT nasal spray Place 2 sprays into both nostrils daily. 16 g 5   furosemide (LASIX) 40 MG tablet Take 1 tablet (40 mg total) by mouth daily. 90 tablet 1   Homeopathic Products (ALLERGY MEDICINE PO) Take 1 tablet by mouth as needed (Takes the Clortabs. Equate Brand).     HYDROcodone-acetaminophen (NORCO/VICODIN) 5-325 MG tablet Take 1 tablet by mouth every 6 (six) hours as needed. 15 tablet 0   hydrOXYzine (VISTARIL) 25 MG capsule Take 1 capsule (25 mg total) by mouth 2 (two) times daily as needed for anxiety. And sleep, racing thoughts 60 capsule 1   loperamide (IMODIUM) 2 MG capsule Take 2 mg by mouth daily as needed.      omeprazole (PRILOSEC) 40 MG capsule Take 1 capsule (40 mg total) by mouth 2 (two) times daily. OPEN CAPSULE AND SPRINKLE ON APPLESAUCE. 60 capsule 11   Probiotic Product (ALIGN) CHEW Chew 2 tablets by mouth daily. Align pre and probiotic with vitamain C-take one tablet each Align pre and probiotic with Vitamin b12 for immune and energy-take one tablet each     QUEtiapine (SEROQUEL) 50 MG tablet Take 1 tablet (50 mg total) by mouth at bedtime. 30 tablet 1   rOPINIRole  (REQUIP) 1 MG tablet Take 1 tablet (1 mg total) by mouth at bedtime. 90 tablet 1   sertraline (ZOLOFT) 100 MG tablet Take 1.5 tablets (150 mg total) by mouth daily for 15 days, THEN 2 tablets (200 mg total) daily for 15 days. 53 tablet 0   SYRINGE-NEEDLE, DISP, 3 ML (B-D 3CC LUER-LOK SYR 25GX5/8") 25G X 5/8" 3 ML MISC USE AS DIRECTED WITH B-12 INJECTION 16 each 2   VITAMIN A PO Take by mouth.     No current facility-administered medications for this visit.     Musculoskeletal: Strength & Muscle Tone: {desc; muscle tone:32375} Gait & Station: {PE GAIT ED DGLO:75643} Patient leans: {Patient Leans:21022755}  Psychiatric Specialty Exam: Review of Systems  There were no vitals taken for this visit.There is no height or weight on file to calculate BMI.  General Appearance: {Appearance:22683}  Eye Contact:  {BHH EYE CONTACT:22684}  Speech:  {Speech:22685}  Volume:  {Volume (PAA):22686}  Mood:  {BHH MOOD:22306}  Affect:  {Affect (PAA):22687}  Thought Process:  {Thought Process (PAA):22688}  Orientation:  {BHH ORIENTATION (PAA):22689}  Thought Content: {Thought Content:22690}   Suicidal Thoughts:  {ST/HT (PAA):22692}  Homicidal Thoughts:  {ST/HT (PAA):22692}  Memory:  {BHH MEMORY:22881}  Judgement:  {Judgement (PAA):22694}  Insight:  {Insight (PAA):22695}  Psychomotor Activity:  {Psychomotor (PAA):22696}  Concentration:  {Concentration:21399}  Recall:  {BHH GOOD/FAIR/POOR:22877}  Fund of Knowledge: {BHH GOOD/FAIR/POOR:22877}  Language: {BHH GOOD/FAIR/POOR:22877}  Akathisia:  {BHH YES OR NO:22294}  Handed:  {Handed:22697}  AIMS (if indicated): {Desc; done/not:10129}  Assets:  {Assets (PAA):22698}  ADL's:  {BHH PIR'J:18841}  Cognition: {chl bhh cognition:304700322}  Sleep:  {BHH GOOD/FAIR/POOR:22877}   Screenings: AIMS    Flowsheet Row Office Visit from 11/01/2022 in [facility]  Psychiatric Associates [facility] Visit from [facility]/03/2022 in [facility]  Psychiatric Associates Office Visit from 01/20/2022 in [facility] Office Visit from 11/17/2021 in [facility] Office Visit from 06/13/2021 in [facility]  AIMS Total Score 0 0 0 0 0      GAD-7    Flowsheet Row Counselor from 11/07/2022 in [facility] Office Visit from 11/01/2022 in [facility] Office Visit from 04/07/2022 in [facility] at [facility] Office Visit from 01/20/2022 in [facility] Office Visit from 11/17/2021 in [facility]  Total GAD-7 Score 21 20 18 19 20       PHQ2-9    Flowsheet Row Counselor from 11/07/2022 in [facility] Office Visit from 11/01/2022 in [facility] Video Visit from 08/21/2022 in [facility] Video Visit from 07/04/2022 in [facility] Office Visit from 04/07/2022 in [facility] at [facility]  PHQ-2 Total Score 6 5 1  0 5  PHQ-9 Total Score 20 18 -- -- 19      Flowsheet Row Counselor from 11/07/2022 in [facility] Office Visit from 11/01/2022 in [facility] Video Visit from 09/21/2022 in [facility]  C-SSRS RISK CATEGORY Moderate Risk Low Risk Low Risk        Assessment and Plan: ***  Collaboration of Care: Collaboration of Care: [facility] OP Collaboration of Care:21014065}  Patient/Guardian was advised Release of Information must be obtained prior to any record release in order to collaborate their care with an outside provider. Patient/Guardian  was advised if they have not already done so to contact the registration department to sign all necessary forms in order for [place] to release information regarding their care.   Consent: Patient/Guardian gives verbal consent for treatment and assignment of benefits for services provided during this visit. Patient/Guardian expressed understanding and agreed to proceed.    Dereck Leep 11/13/2022, 9:10 AM

## 2022-11-13 NOTE — Progress Notes (Deleted)
   THERAPIST PROGRESS NOTE  Session Time: ***  Participation Level: {BHH PARTICIPATION LEVEL:22264}  Behavioral Response: {Appearance:22683}{BHH LEVEL OF CONSCIOUSNESS:22305}{BHH MOOD:22306}  Type of Therapy: {CHL AMB BH Type of Therapy:21022741}  Treatment Goals addressed: ***  ProgressTowards Goals: {Progress Towards Goals:21014066}  Interventions: {CHL AMB BH Type of Intervention:21022753}  Summary: [name] is a 57 y.o. female who presents with ***.   Suicidal/Homicidal: {BHH YES OR NO:22294}{yes/no/with/without intent/plan:22693}  Therapist Response: ***  Plan: Return again in *** weeks.  Diagnosis: No diagnosis found.  Collaboration of Care: {BH OP Collaboration of Care:21014065}  Patient/Guardian was advised Release of Information must be obtained prior to any record release in order to collaborate their care with an outside provider. Patient/Guardian was advised if they have not already done so to contact the registration department to sign all necessary forms in order for [place] to release information regarding their care.   Consent: Patient/Guardian gives verbal consent for treatment and assignment of benefits for services provided during this visit. Patient/Guardian expressed understanding and agreed to proceed.   Dereck Leep 11/13/2022

## 2022-11-13 NOTE — Progress Notes (Deleted)
[name] is a 57 y.o. female patient ***.     Collaboration of Care: {BH OP Collaboration of Care:21014065}  Patient/Guardian was advised Release of Information must be obtained prior to any record release in order to collaborate their care with an outside provider. Patient/Guardian was advised if they have not already done so to contact the registration department to sign all necessary forms in order for [place] to release information regarding their care.   Consent: Patient/Guardian gives verbal consent for treatment and assignment of benefits for services provided during this visit. Patient/Guardian expressed understanding and agreed to proceed.    Dereck Leep

## 2022-11-22 ENCOUNTER — Other Ambulatory Visit: Payer: Self-pay

## 2022-11-22 ENCOUNTER — Other Ambulatory Visit: Payer: Self-pay | Admitting: Psychiatry

## 2022-11-22 DIAGNOSIS — F3341 Major depressive disorder, recurrent, in partial remission: Secondary | ICD-10-CM

## 2022-11-22 DIAGNOSIS — F4310 Post-traumatic stress disorder, unspecified: Secondary | ICD-10-CM

## 2022-11-23 ENCOUNTER — Other Ambulatory Visit: Payer: Self-pay

## 2022-11-23 MED ORDER — QUETIAPINE FUMARATE 50 MG PO TABS
50.0000 mg | ORAL_TABLET | Freq: Every day | ORAL | 0 refills | Status: DC
Start: 2022-11-23 — End: 2022-12-25
  Filled 2022-11-23: qty 30, 30d supply, fill #0

## 2022-11-27 ENCOUNTER — Encounter: Payer: Self-pay | Admitting: Psychiatry

## 2022-11-27 ENCOUNTER — Telehealth (INDEPENDENT_AMBULATORY_CARE_PROVIDER_SITE_OTHER): Payer: Medicaid Other | Admitting: Psychiatry

## 2022-11-27 DIAGNOSIS — F4010 Social phobia, unspecified: Secondary | ICD-10-CM

## 2022-11-27 DIAGNOSIS — F4310 Post-traumatic stress disorder, unspecified: Secondary | ICD-10-CM | POA: Diagnosis not present

## 2022-11-27 DIAGNOSIS — F1021 Alcohol dependence, in remission: Secondary | ICD-10-CM

## 2022-11-27 DIAGNOSIS — F332 Major depressive disorder, recurrent severe without psychotic features: Secondary | ICD-10-CM | POA: Diagnosis not present

## 2022-11-27 DIAGNOSIS — G2581 Restless legs syndrome: Secondary | ICD-10-CM | POA: Diagnosis not present

## 2022-11-27 NOTE — Progress Notes (Unsigned)
Virtual Visit via Video Note  I connected with [name] on 11/27/22 at  4:00 PM EST by a video enabled telemedicine application and verified that I am speaking with the correct person using two identifiers.  Location Provider Location : [facility] Patient Location : Home  Participants: Patient , Provider    I discussed the limitations of evaluation and management by telemedicine and the availability of in person appointments. The patient expressed understanding and agreed to proceed.    I discussed the assessment and treatment plan with the patient. The patient was provided an opportunity to ask questions and all were answered. The patient agreed with the plan and demonstrated an understanding of the instructions.   The patient was advised to call back or seek an in-person evaluation if the symptoms worsen or if the condition fails to improve as anticipated.   BH MD OP Progress Note  11/28/2022 12:24 PM [name]  MRN:  [id number]  Chief Complaint:  Chief Complaint  Patient presents with   Follow-up   Depression   Anxiety   Medication Refill   HPI: [name] is a 57 year old Caucasian female, married, unemployed, has a history of PTSD, MDD, social anxiety disorder, RLS, alcohol use disorder in remission, chronic pain, [place] was evaluated by telemedicine today.  Patient today reports she has noticed some improvement with regards to her anxiety and depression on the higher dosage of Zoloft.  She currently takes a 200 mg.  Has been taking the higher dosage since the past 1 week.  Denies any side effects.  Patient reports she continues to be compliant on all her medications.  Denies any side effects.  Patient reports sleep is overall good.  She does continues to struggle with flashbacks, social anxiety.  She however reports she is currently working with her therapist and that helps.  She is motivated to stay in therapy.  Her current therapist is Ms.  Renee Ramus.  She had recent appointment.  Patient denies any suicidality, homicidality or perceptual disturbances.  Patient however reports she does have chronic passive thoughts of not wanting to be here and the last time she had it was couple of weeks ago.  Patient however currently denies it.  Patient denies any other concerns today.  Visit Diagnosis:    ICD-10-CM   1. PTSD (post-traumatic stress disorder)  F43.10     2. Severe episode of recurrent major depressive disorder, without psychotic features (HCC)  F33.2     3. Social anxiety disorder  F40.10     4. RLS (restless legs syndrome)  G25.81     5. Alcohol use disorder, severe, in sustained remission (HCC)  F10.21       Past Psychiatric History: I have reviewed past psychiatric history from progress note on 04/04/2021.  Trials of medications-Wellbutrin, Abilify, Zoloft, Xanax, Cymbalta, Pristiq, BuSpar, Deplin, Lexapro, Valium, Viibryd, Trintellix, Lamictal.  Patient had psychological testing completed-05/05/2021-Dr. Rodenbough-does not meet criteria for ADHD.  Patient was evaluated on 06/01/2022-patient recommended to continue psychiatric treatment for PTSD, depression, sleep.  Past Medical History:  Past Medical History:  Diagnosis Date   Allergy    Anemia    Anxiety    Asthma    Depression    GERD (gastroesophageal reflux disease)    Heart murmur    from childhood   Hiatal hernia    Hypertension    Peptic ulcer    PTSD (post-traumatic stress disorder)    UTI (urinary tract infection)  Past Surgical History:  Procedure Laterality Date   ABDOMINAL HYSTERECTOMY     fibroids   BIOPSY  07/11/2018   Procedure: BIOPSY;  Surgeon: Meryl Dare, MD;  Location: [facility] ENDOSCOPY;  Service: Endoscopy;;   COLONOSCOPY  2014   ESOPHAGOGASTRODUODENOSCOPY (EGD) WITH PROPOFOL N/A 07/11/2018   Procedure: ESOPHAGOGASTRODUODENOSCOPY (EGD) WITH PROPOFOL;  Surgeon: Meryl Dare, MD;  Location[facility] ENDOSCOPY;  Service:  Endoscopy;  Laterality: N/A;   GASTRIC BYPASS  2007   HEMOSTASIS CLIP PLACEMENT  07/11/2018   Procedure: HEMOSTASIS CLIP PLACEMENT;  Surgeon: Meryl Dare, MD;  Location: [facility] ENDOSCOPY;  Service: Endoscopy;;   LAPAROTOMY N/A 03/08/2018   Procedure: EXPLORATORY LAPAROTOMY, REPAIR AND PATCH CLOSURE OF PERFORATED ULCER;  Surgeon: Violeta Gelinas, MD;  Location: [facility] OR;  Service: General;  Laterality: N/A;   TUBAL LIGATION      Family Psychiatric History: I have reviewed family psychiatric history from progress note on 04/04/2021.  Family History:  Family History  Problem Relation Age of Onset   Stroke Mother    Alzheimer's disease Mother    Hypertension Father    Cancer Father        Prostate   Alcohol abuse Sister    Depression Sister    Alcohol abuse Brother    Depression Brother    Diabetes Maternal Grandfather    Liver disease Maternal Grandfather    Cancer Maternal Grandfather        Prostate   Diabetes Maternal Grandmother    Alzheimer's disease Maternal Grandmother    Diabetes Paternal Grandfather    Cancer Paternal Grandfather        Lung, Prostate   Diabetes Paternal Grandmother    Alzheimer's disease Paternal Grandmother    Colon cancer Neg Hx    Esophageal cancer Neg Hx    Pancreatic cancer Neg Hx    Stomach cancer Neg Hx    Colon polyps Neg Hx    Rectal cancer Neg Hx     Social History: I have reviewed social history from progress note on 04/04/2021. Social History   Socioeconomic History   Marital status: Married    Spouse name: mark   Number of children: 0   Years of education: Not on file   Highest education level: High school graduate  Occupational History    Employer: [employer]    Comment: [employer]  Tobacco Use   Smoking status: Former    Current packs/day: 0.00    Types: Cigarettes    Quit date: 01/24/1987    Years since quitting: 35.8   Smokeless tobacco: Never  Vaping Use   Vaping status: Former   Substances: Flavoring   Devices: has  vaped with flavor but no nicotine. Been 8 years  Substance and Sexual Activity   Alcohol use: Not Currently    Alcohol/week: 35.0 standard drinks of alcohol    Types: 35 Cans of beer per week   Drug use: No   Sexual activity: Not Currently  Other Topics Concern   Not on file  Social History Narrative   Regular exercise--no (because of knees)   Social Determinants of Health   Financial Resource Strain: High Risk (11/07/2022)   Overall Financial Resource Strain (CARDIA)    Difficulty of Paying Living Expenses: Very hard  Food Insecurity: Food Insecurity Present (11/07/2022)   Hunger Vital Sign    Worried About Running Out of Food in the Last Year: Sometimes true    Ran Out of Food in the Last Year: Sometimes  true  Transportation Needs: No Transportation Needs (11/07/2022)   PRAPARE - Administrator, Civil Service (Medical): No    Lack of Transportation (Non-Medical): No  Physical Activity: Not on file  Stress: Not on file  Social Connections: Moderately Integrated (11/07/2022)   Social Connection and Isolation Panel [NHANES]    Frequency of Communication with Friends and Family: More than three times a week    Frequency of Social Gatherings with Friends and Family: More than three times a week    Attends Religious Services: More than 4 times per year    Active Member of [employer] or Organizations: No    Attends Banker Meetings: Never    Marital Status: Married    Allergies:  Allergies  Allergen Reactions   Lamictal [Lamotrigine] Rash   Penicillins Other (See Comments)    Did it involve swelling of the face/tongue/throat, SOB, or low BP? Unk Did it involve sudden or severe rash/hives, skin peeling, or any reaction on the inside of your mouth or nose? Unk Did you need to seek medical attention at a hospital or doctor's office? Unk When did it last happen? Was 57 years old; reaction not recalled     If all above answers are "NO", may proceed with  cephalosporin use.    Codeine Itching    Crazy dreams and itching-pt said she can take it with food    Metabolic Disorder Labs: Lab Results  Component Value Date   HGBA1C 5.3 05/24/2022   MPG 105.41 05/24/2022   Lab Results  Component Value Date   PROLACTIN 16.9 05/24/2022   Lab Results  Component Value Date   CHOL 174 04/07/2022   TRIG 69 04/07/2022   HDL 66 04/07/2022   CHOLHDL 2.6 04/07/2022   VLDL 9.0 07/19/2021   LDLCALC 92 04/07/2022   LDLCALC 70 07/19/2021   Lab Results  Component Value Date   TSH 3.15 08/04/2022   TSH 1.87 04/07/2022    Therapeutic Level Labs: No results found for: "LITHIUM" No results found for: "VALPROATE" No results found for: "CBMZ"  Current Medications: Current Outpatient Medications  Medication Sig Dispense Refill   Acetaminophen (TYLENOL 8 HOUR ARTHRITIS PAIN PO) Take 2 tablets by mouth as needed.     busPIRone (BUSPAR) 15 MG tablet Take 1 tablet by mouth 2 (two) times daily. Take daily at 9 AM and at 4 PM 180 tablet 0   Cholecalciferol (VITAMIN D3) 50 MCG (2000 UT) capsule Take 1 capsule (2,000 Units total) by mouth daily. 100 capsule 0   colestipol (COLESTID) 1 g tablet Take 2 tablets (2 g total) by mouth 2 (two) times daily. Take 2 hours away from other medications. 120 tablet 11   cyanocobalamin (VITAMIN B12) 1000 MCG/ML injection Inject 1 mL (1,000 mcg total) into the muscle every 30 (thirty) days. 10 mL 0   cyclobenzaprine (FLEXERIL) 10 MG tablet Take 1 tablet (10 mg total) by mouth 3 (three) times daily as needed. for muscle spams 30 tablet 2   Diclofenac Sodium 2 % SOLN Apply 1 pump twice daily as needed. 112 g 1   estradiol (DOTTI) 0.1 MG/24HR patch Place 1 patch (0.1 mg total) onto the skin 2 (two) times a week. 8 patch 1   estradiol (ESTRACE) 0.1 MG/GM vaginal cream Apply a pea-sized amount of cream to the fingertip and wipe in the front part of the vagina twice weekly 42.5 g 0   fluticasone (FLONASE) 50 MCG/ACT nasal spray  Place 2  sprays into both nostrils daily. 16 g 5   furosemide (LASIX) 40 MG tablet Take 1 tablet (40 mg total) by mouth daily. 90 tablet 1   Homeopathic Products (ALLERGY MEDICINE PO) Take 1 tablet by mouth as needed (Takes the Clortabs. Equate Brand).     HYDROcodone-acetaminophen (NORCO/VICODIN) 5-325 MG tablet Take 1 tablet by mouth every 6 (six) hours as needed. 15 tablet 0   hydrOXYzine (VISTARIL) 25 MG capsule Take 1 capsule (25 mg total) by mouth 2 (two) times daily as needed for anxiety. And sleep, racing thoughts 60 capsule 1   loperamide (IMODIUM) 2 MG capsule Take 2 mg by mouth daily as needed.      omeprazole (PRILOSEC) 40 MG capsule Take 1 capsule (40 mg total) by mouth 2 (two) times daily. OPEN CAPSULE AND SPRINKLE ON APPLESAUCE. 60 capsule 11   Probiotic Product (ALIGN) CHEW Chew 2 tablets by mouth daily. Align pre and probiotic with vitamain C-take one tablet each Align pre and probiotic with Vitamin b12 for immune and energy-take one tablet each     QUEtiapine (SEROQUEL) 50 MG tablet Take 1 tablet (50 mg total) by mouth at bedtime. 30 tablet 0   rOPINIRole (REQUIP) 1 MG tablet Take 1 tablet (1 mg total) by mouth at bedtime. 90 tablet 1   sertraline (ZOLOFT) 100 MG tablet Take 1.5 tablets (150 mg total) by mouth daily for 15 days, THEN 2 tablets (200 mg total) daily for 15 days. 53 tablet 0   SYRINGE-NEEDLE, DISP, 3 ML (B-D 3CC LUER-LOK SYR 25GX5/8") 25G X 5/8" 3 ML MISC USE AS DIRECTED WITH B-12 INJECTION 16 each 2   VITAMIN A PO Take by mouth.     No current facility-administered medications for this visit.     Musculoskeletal: Strength & Muscle Tone:  UTA Gait & Station:  Seated Patient leans: N/A  Psychiatric Specialty Exam: Review of Systems  Psychiatric/Behavioral:  Positive for dysphoric mood. The patient is nervous/anxious.     There were no vitals taken for this visit.There is no height or weight on file to calculate BMI.  General Appearance: Fairly Groomed  Eye  Contact:  Fair  Speech:  Clear and Coherent  Volume:  Normal  Mood:  Anxious and Depressed  Affect:  Appropriate  Thought Process:  Goal Directed and Descriptions of Associations: Intact  Orientation:  Full (Time, Place, and Person)  Thought Content: Logical   Suicidal Thoughts:  No  Homicidal Thoughts:  No  Memory:  Immediate;   Fair Recent;   Fair Remote;   Fair  Judgement:  Fair  Insight:  Fair  Psychomotor Activity:  Normal  Concentration:  Concentration: Fair and Attention Span: Fair  Recall:  [employer] of Knowledge: Fair  Language: Fair  Akathisia:  No  Handed:  Right  AIMS (if indicated): not done  Assets:  Communication Skills Desire for Improvement Housing Social Support  ADL's:  Intact  Cognition: WNL  Sleep:  Fair   Screenings: Midwife Visit from 11/01/2022 in [facility] Office Visit from 03/03/2022 in [facility] Office Visit from 01/20/2022 in [facility] Office Visit from 11/17/2021 in [facility] Office Visit from 06/13/2021 in [facility]  AIMS Total Score 0 0 0 0 0      GAD-7    Flowsheet Row Counselor from 11/07/2022 in [facility] Visit from 11/01/2022 in [facility] Office Visit from 04/07/2022 in [facility] Office Visit from 01/20/2022 in [facility] Office Visit from 11/17/2021 in [facility]  Total GAD-7 Score 21 20 18 19 20       PHQ2-9    Flowsheet Row Counselor from 11/07/2022 in [facility] Office Visit from 11/01/2022 in [facility]  Video Visit from 08/21/2022 in [facility] Video Visit from 07/04/2022 in [facility] Office Visit from 04/07/2022 in [facility]  PHQ-2 Total Score 6 5 1  0 5  PHQ-9 Total Score 20 18 -- -- 19      Flowsheet Row Video Visit from 11/27/2022 in [facility] Counselor from 11/07/2022 in [facility] Office Visit from 11/01/2022 in [facility]  C-SSRS RISK CATEGORY Moderate Risk Moderate Risk Low Risk        Assessment and Plan: Autymn Omlor is a 57 year old Caucasian female who has a history of PTSD, MDD, social anxiety, alcohol use disorder in remission, chronic pain was evaluated by telemedicine today.  Patient is currently improving on the higher dosage of sertraline, will benefit from continued CBT, plan as noted below.  Plan  PTSD-improving Continue Zoloft 200 mg p.o. daily Continue CBT with [name].   MDD-improving Seroquel 50 mg p.o. nightly. Zoloft 200 mg p.o. daily. Hydroxyzine 25 mg p.o. twice daily as needed for severe anxiety attacks  Social anxiety disorder-improving Continue CBT with [name] Hydroxyzine 25 mg twice a day as needed.  RLS-improving Requip 1 mg p.o. nightly  Alcohol use disorder in remission Sober since February 2022.    Collaboration of Care: Collaboration of Care: Referral or follow-up with counselor/therapist AEB patient encouraged to continue therapy  Patient/Guardian was advised Release of Information must be obtained prior to any record release in order to collaborate their care with an outside provider. Patient/Guardian was advised if they have not already done so to contact the registration department to sign all necessary forms in order for [place] to release information regarding  their care.   Consent: Patient/Guardian gives verbal consent for treatment and assignment of benefits for services provided during this visit. Patient/Guardian expressed understanding and agreed to proceed.   Follow-up in clinic in 6 weeks or sooner if needed.  This note was generated in part or whole with voice recognition software. Voice recognition is usually quite accurate but there are transcription errors that can and very often do occur. I apologize for any typographical errors that were not detected and corrected.    Jomarie Longs, MD 11/28/2022, 12:24 PM

## 2022-11-28 ENCOUNTER — Ambulatory Visit (INDEPENDENT_AMBULATORY_CARE_PROVIDER_SITE_OTHER): Payer: Medicaid Other | Admitting: Licensed Clinical Social Worker

## 2022-11-28 ENCOUNTER — Other Ambulatory Visit: Payer: Self-pay

## 2022-11-28 DIAGNOSIS — F331 Major depressive disorder, recurrent, moderate: Secondary | ICD-10-CM

## 2022-11-28 DIAGNOSIS — F4310 Post-traumatic stress disorder, unspecified: Secondary | ICD-10-CM

## 2022-11-28 DIAGNOSIS — F4010 Social phobia, unspecified: Secondary | ICD-10-CM | POA: Diagnosis not present

## 2022-11-28 MED FILL — Fluticasone Propionate Nasal Susp 50 MCG/ACT: NASAL | 30 days supply | Qty: 16 | Fill #2 | Status: CN
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
## 2022-12-04 ENCOUNTER — Ambulatory Visit (INDEPENDENT_AMBULATORY_CARE_PROVIDER_SITE_OTHER): Payer: Medicaid Other | Admitting: Licensed Clinical Social Worker

## 2022-12-04 DIAGNOSIS — F4310 Post-traumatic stress disorder, unspecified: Secondary | ICD-10-CM | POA: Diagnosis not present

## 2022-12-04 DIAGNOSIS — F4010 Social phobia, unspecified: Secondary | ICD-10-CM | POA: Diagnosis not present

## 2022-12-04 DIAGNOSIS — F331 Major depressive disorder, recurrent, moderate: Secondary | ICD-10-CM

## 2022-12-04 NOTE — Progress Notes (Signed)
THERAPIST PROGRESS NOTE  Session Time: 9:03am-9:45am  Participation Level: Active  Behavioral Response: CasualAlertDepressed  Type of Therapy: Individual Therapy  Treatment Goals addressed:  LTG: Reduce frequency, intensity, and duration of depression symptoms so that daily functioning is improved (OP Depression) LTG: Increase coping skills to manage depression and improve ability to perform daily activities (OP Depression) Active     BH CCP Acute or Chronic Trauma Reaction     STG: Jozette will practice emotion regulation skills 3 time(s) per week for the next 8 week(s) (Initial)     Start:  11/28/22    Expected End:  04/27/23         STG: Report a decrease in PTSD symptoms as evidenced by a 50% reduction in overall score on a clinician administered PTSD assessment screen/scale (Initial)     Start:  11/28/22    Expected End:  04/27/23         Provide Leatrice with education on trauma-oriented therapy     Start:  11/28/22         Cooperate with trauma-focused psychotherapy techniques to reduce emotional reaction to the traumatic event      Start:  11/28/22         Encourage Cindie to participate in a systemic desensitization procedure to gradually expose the victim to non-harmful stimuli that are associated with the trauma     Start:  11/28/22         Work with Martie Lee to identify how the trauma has negatively impacted his/ her life     Start:  11/28/22         Educate Alexsus as to the origins of PTSD, common symptoms, and how it impacts those affected by it     Start:  11/28/22           OP Depression     LTG: Reduce frequency, intensity, and duration of depression symptoms so that daily functioning is improved (Initial)     Start:  11/28/22    Expected End:  04/27/23         LTG: Increase coping skills to manage depression and improve ability to perform daily activities (Initial)     Start:  11/28/22    Expected End:  04/27/23         STG:  Suzelle will identify cognitive patterns and beliefs that support depression (Initial)     Start:  11/28/22    Expected End:  04/27/23         Work with Martie Lee to identify the major components of a recent episode of depression: physical symptoms, major thoughts and images, and major behaviors they experienced     Start:  11/28/22       Intervention Note     Reviewed with patient during session.          Therapist will educate patient on cognitive distortions and the rationale for treatment of depression     Start:  11/28/22         Keelyn will identify 3 trauma related cognitive distortions     Start:  11/28/22         Create a weekly activity schedule     Start:  11/28/22         Poonam will review pleasant activities list and select 2 activities to practice weekly for the next 8 weeks     Start:  11/28/22         Perform motivational interviewing regarding physical activity  Start:  11/28/22           Social Interpersonal Effectiveness     LTG: Nijae will recognize socially inappropriate behaviors and develop alternative behaviors (Initial)     Start:  11/28/22    Expected End:  04/27/23         STG: Laporscha will identify 3 behaviors that engage in social isolation  (Initial)     Start:  11/28/22    Expected End:  04/27/23         Observe patient's social engagement     Start:  11/28/22         Facilitate discussions with Daritza regarding miscommunications that occur during social interactions     Start:  11/28/22         Work with Martie Lee to identify 3 signs that they are engaging in social isolation     Start:  11/28/22            ProgressTowards Goals: Progressing  Interventions: CBT, Assertiveness Training, and Supportive  Summary: [name] is a 57 y.o. female who presents for a follow-up with clinician.  Patient arrives on time and maintains appropriate eye contact throughout the session.  Patient oriented x 5 and denies  SI/HI/AVH.   Patient identifies depressive symptoms citing difficulty staying asleep reporting she has been tossing and turning.  Patient identifies upsetting dreams where "I dream I die "through drowning "but states, "I can swim like a fish and real life."  Patient identifies symptoms have worsened this week due to an inability to leave her home following worsening gastrointestinal issues.   Reflected on her homework and identified she was able to get a journal to write positive codes and has hung her sticky notes throughout her home.  Patient reports these quotes "gave me more positivity in the day to look forward to."  Patient identifies she finds purpose in wood-burning/crafts.  Patient reflected on times in which she has utilized these outlets to cope with stress and depressive symptoms.  Patient identified she was able to watch the video explaining the components of EMDR treatment.  Patient continues to express interest in completing EMDR treatment but identifies she would like to wait until after her knee surgery at the beginning of January.  Patient utilized therapeutic space to process feelings of stress while at home.  Reflected on dynamic with husband as he will switch roles with her as the caretaker following her knee surgery.  Reflected on husband's "better mood. "Reflected on feelings of regret following selflessness with others who she identifies may take advantage of her kindness.  Patient reflected on low self-worth when she first got married and overarching questions about marriage.  Patient identifies her negative cognition that she is a failure attributed to a lot of her life decisions that she is now coming to regret in older age.  Reflected on ways she can advocate for herself and engage in time for herself.  Patient identified before agreeing to take on another task for somebody else, she will think through questions discussed to challenge herself to set appropriate boundaries.  Pt  identifies she still has hope that 1 day she will learn how to put herself first..   Suicidal/Homicidal: Nowithout intent/plan  Therapist Response: Clinician assessed for current stressors, symptoms, and safety since the patient's previous session.  Clinician utilized active listening and validation to help the patient feel heard and understood in her session while she processed recent life stressors.  Therapist evaluated self-care/coping skills  and medication compliance since last session.  Explored patient's feelings about EMDR and answered questions regarding EMDR treatment.  Utilized CBT cognitive reframing techniques to process patient's regret and overarching negative cognition that she is a failure.  Challenged patient to confront negative beliefs and break unhelpful patterns of putting others first.  Empowered patient to advocate for her needs and set healthy boundaries.  Therapist assessed clients level of motivation for change.  Plan: Return again in 1 week.  Diagnosis:PTSD (post-traumatic stress disorder)  Social anxiety disorder  MDD (major depressive disorder), recurrent episode, moderate (HCC)   Collaboration of Care: Psychiatrist AEB   psychiatrist can access notes and cln. Will review psychiatrists' notes. Check in with the patient and will see LCSW per availability. Patient agreed with treatment recommendations. Pt. is scheduled for a follow-up in one week.   Patient/Guardian was advised Release of Information must be obtained prior to any record release in order to collaborate their care with an outside provider. Patient/Guardian was advised if they have not already done so to contact the registration department to sign all necessary forms in order for [place] to release information regarding their care.   Consent: Patient/Guardian gives verbal consent for treatment and assignment of benefits for services provided during this visit. Patient/Guardian expressed understanding and agreed  to proceed.   Dereck Leep, LCSW 12/04/2022

## 2022-12-04 NOTE — Pre-Procedure Instructions (Signed)
Surgical Instructions   Your procedure is scheduled on Tuesday, November 19th. Report to [facility] Main Entrance "A" at 10:00 A.M., then check in with the Admitting office. Any questions or running late day of surgery: call [phone]  Questions prior to your surgery date: call [phone], Monday-Friday, 8am-4pm. If you experience any cold or flu symptoms such as cough, fever, chills, shortness of breath, etc. between now and your scheduled surgery, please notify us at the above number.     Remember:  Do not eat after midnight the night before your surgery  You may drink clear liquids until 09:00 AM the morning of your surgery.   Clear liquids allowed are: Water, Non-Citrus Juices (without pulp), Carbonated Beverages, Clear Tea (no milk, honey, etc.), Black Coffee Only (NO MILK, CREAM OR POWDERED CREAMER of any kind), and Gatorade.  Patient Instructions  The night before surgery:  No food after midnight. ONLY clear liquids after midnight  The day of surgery (if you do NOT have diabetes):  Drink ONE (1) Pre-Surgery Clear Ensure by 09:00 AM the morning of surgery. Drink in one sitting. Do not sip.  This drink was given to you during your hospital  pre-op appointment visit.  Nothing else to drink after completing the  Pre-Surgery Clear Ensure.          If you have questions, please contact your surgeon's office.    Take these medicines the morning of surgery with A SIP OF WATER  busPIRone (BUSPAR)  hydrOXYzine (VISTARIL)  omeprazole (PRILOSEC)  sertraline (ZOLOFT)    May take these medicines IF NEEDED: acetaminophen (TYLENOL)  cyclobenzaprine (FLEXERIL)  fluticasone (FLONASE)  HYDROcodone-acetaminophen (NORCO/VICODIN    One week prior to surgery, STOP taking any Aspirin (unless otherwise instructed by your surgeon) Aleve, Naproxen, Ibuprofen, Motrin, Advil, Diclofenac Sodium, Goody's, BC's, all herbal medications, fish oil, and non-prescription vitamins.                      Do NOT Smoke (Tobacco/Vaping) for 24 hours prior to your procedure.  If you use a CPAP at night, you may bring your mask/headgear for your overnight stay.   You will be asked to remove any contacts, glasses, piercing's, hearing aid's, dentures/partials prior to surgery. Please bring cases for these items if needed.    Patients discharged the day of surgery will not be allowed to drive home, and someone needs to stay with them for 24 hours.  SURGICAL WAITING ROOM VISITATION Patients may have no more than 2 support people in the waiting area - these visitors may rotate.   Pre-op nurse will coordinate an appropriate time for 1 ADULT support person, who may not rotate, to accompany patient in pre-op.  Children under the age of 24 must have an adult with them who is not the patient and must remain in the main waiting area with an adult.  If the patient needs to stay at the hospital during part of their recovery, the visitor guidelines for inpatient rooms apply.  Please refer to the [facility] website for the visitor guidelines for any additional information.   If you received a COVID test during your pre-op visit  it is requested that you wear a mask when out in public, stay away from anyone that may not be feeling well and notify your surgeon if you develop symptoms. If you have been in contact with anyone that has tested positive in the last 10 days please notify you surgeon.  Pre-operative 5 CHG Bathing Instructions   You can play a key role in reducing the risk of infection after surgery. Your skin needs to be as free of germs as possible. You can reduce the number of germs on your skin by washing with CHG (chlorhexidine gluconate) soap before surgery. CHG is an antiseptic soap that kills germs and continues to kill germs even after washing.   DO NOT use if you have an allergy to chlorhexidine/CHG or antibacterial soaps. If your skin becomes reddened or irritated, stop using  the CHG and notify one of our RNs at [phone].   Please shower with the CHG soap starting 4 days before surgery using the following schedule:     Please keep in mind the following:  DO NOT shave, including legs and underarms, starting the day of your first shower.   You may shave your face at any point before/day of surgery.  Place clean sheets on your bed the day you start using CHG soap. Use a clean washcloth (not used since being washed) for each shower. DO NOT sleep with pets once you start using the CHG.   CHG Shower Instructions:  Wash your face and private area with normal soap. If you choose to wash your hair, wash first with your normal shampoo.  After you use shampoo/soap, rinse your hair and body thoroughly to remove shampoo/soap residue.  Turn the water OFF and apply about 3 tablespoons (45 ml) of CHG soap to a CLEAN washcloth.  Apply CHG soap ONLY FROM YOUR NECK DOWN TO YOUR TOES (washing for 3-5 minutes)  DO NOT use CHG soap on face, private areas, open wounds, or sores.  Pay special attention to the area where your surgery is being performed.  If you are having back surgery, having someone wash your back for you may be helpful. Wait 2 minutes after CHG soap is applied, then you may rinse off the CHG soap.  Pat dry with a clean towel  Put on clean clothes/pajamas   If you choose to wear lotion, please use ONLY the CHG-compatible lotions on the back of this paper.   Additional instructions for the day of surgery: DO NOT APPLY any lotions, deodorants, cologne, or perfumes.   Do not bring valuables to the hospital. [facility] is not responsible for any belongings/valuables. Do not wear nail polish, gel polish, artificial nails, or any other type of covering on natural nails (fingers and toes) Do not wear jewelry or makeup Put on clean/comfortable clothes.  Please brush your teeth.  Ask your nurse before applying any prescription medications to the skin.     CHG  Compatible Lotions   Aveeno Moisturizing lotion  Cetaphil Moisturizing Cream  Cetaphil Moisturizing Lotion  Clairol Herbal Essence Moisturizing Lotion, Dry Skin  Clairol Herbal Essence Moisturizing Lotion, Extra Dry Skin  Clairol Herbal Essence Moisturizing Lotion, Normal Skin  Curel Age Defying Therapeutic Moisturizing Lotion with Alpha Hydroxy  Curel Extreme Care Body Lotion  Curel Soothing Hands Moisturizing Hand Lotion  Curel Therapeutic Moisturizing Cream, Fragrance-Free  Curel Therapeutic Moisturizing Lotion, Fragrance-Free  Curel Therapeutic Moisturizing Lotion, Original Formula  Eucerin Daily Replenishing Lotion  Eucerin Dry Skin Therapy Plus Alpha Hydroxy Crme  Eucerin Dry Skin Therapy Plus Alpha Hydroxy Lotion  Eucerin Original Crme  Eucerin Original Lotion  Eucerin Plus Crme Eucerin Plus Lotion  Eucerin TriLipid Replenishing Lotion  Keri Anti-Bacterial Hand Lotion  Keri Deep Conditioning Original Lotion Dry Skin Formula Softly Scented  Keri Deep Conditioning Original Lotion,  Fragrance Free Sensitive Skin Formula  Keri Lotion Fast Absorbing Fragrance Free Sensitive Skin Formula  Keri Lotion Fast Absorbing Softly Scented Dry Skin Formula  Keri Original Lotion  Keri Skin Renewal Lotion Keri Silky Smooth Lotion  Keri Silky Smooth Sensitive Skin Lotion  Nivea Body Creamy Conditioning Oil  Nivea Body Extra Enriched Lotion  Nivea Body Original Lotion  Nivea Body Sheer Moisturizing Lotion Nivea Crme  Nivea Skin Firming Lotion  NutraDerm 30 Skin Lotion  NutraDerm Skin Lotion  NutraDerm Therapeutic Skin Cream  NutraDerm Therapeutic Skin Lotion  ProShield Protective Hand Cream  Provon moisturizing lotion  Please read over the following fact sheets that you were given.

## 2022-12-05 ENCOUNTER — Encounter (HOSPITAL_COMMUNITY): Payer: Self-pay

## 2022-12-05 ENCOUNTER — Other Ambulatory Visit: Payer: Self-pay

## 2022-12-05 ENCOUNTER — Encounter (HOSPITAL_COMMUNITY)
Admission: RE | Admit: 2022-12-05 | Discharge: 2022-12-05 | Disposition: A | Discharge status: Home / Self Care | Payer: Medicaid Other | Source: Ambulatory Visit | Attending: Orthopaedic Surgery | Admitting: Orthopaedic Surgery

## 2022-12-05 VITALS — BP 98/71 | HR 66 | Temp 98.4°F | Resp 17 | Ht 62.0 in | Wt 161.8 lb

## 2022-12-05 DIAGNOSIS — I10 Essential (primary) hypertension: Secondary | ICD-10-CM | POA: Diagnosis not present

## 2022-12-05 DIAGNOSIS — M1711 Unilateral primary osteoarthritis, right knee: Secondary | ICD-10-CM | POA: Insufficient documentation

## 2022-12-05 DIAGNOSIS — Z01818 Encounter for other preprocedural examination: Secondary | ICD-10-CM | POA: Insufficient documentation

## 2022-12-05 HISTORY — DX: Unspecified osteoarthritis, unspecified site: M19.90

## 2022-12-05 LAB — COMPREHENSIVE METABOLIC PANEL
ALT: 34 U/L (ref 0–44)
AST: 34 U/L (ref 15–41)
Albumin: 3.5 g/dL (ref 3.5–5.0)
Alkaline Phosphatase: 72 U/L (ref 38–126)
Anion gap: 7 (ref 5–15)
BUN: 10 mg/dL (ref 6–20)
CO2: 28 mmol/L (ref 22–32)
Calcium: 9.1 mg/dL (ref 8.9–10.3)
Chloride: 108 mmol/L (ref 98–111)
Creatinine, Ser: 0.79 mg/dL (ref 0.44–1.00)
GFR, Estimated: >60 mL/min (ref >60)
Glucose, Bld: 67 mg/dL — ABNORMAL LOW (ref 70–99)
Potassium: 4 mmol/L (ref 3.5–5.1)
Sodium: 143 mmol/L (ref 135–145)
Total Bilirubin: 0.5 mg/dL (ref <1.2)
Total Protein: 6.1 g/dL — ABNORMAL LOW (ref 6.5–8.1)

## 2022-12-05 LAB — CBC
HCT: 42.1 % (ref 36.0–46.0)
Hemoglobin: 13.9 g/dL (ref 12.0–15.0)
MCH: 30.9 pg (ref 26.0–34.0)
MCHC: 33 g/dL (ref 30.0–36.0)
MCV: 93.6 fL (ref 80.0–100.0)
Platelets: 207 10*3/uL (ref 150–400)
RBC: 4.5 MIL/uL (ref 3.87–5.11)
RDW: 12.6 % (ref 11.5–15.5)
WBC: 5.3 10*3/uL (ref 4.0–10.5)
nRBC: 0 % (ref 0.0–0.2)

## 2022-12-05 LAB — SURGICAL PCR SCREEN
MRSA, PCR: NEGATIVE
Staphylococcus aureus: NEGATIVE

## 2022-12-05 NOTE — Progress Notes (Signed)
PCP - Dr. Seabron Spates Cardiologist - denies  PPM/ICD - denies   Chest x-ray - 03/21/16 EKG - 12/05/22 Stress Test - denies ECHO - 12/27/21 Cardiac Cath - denies  Sleep Study - denies   DM- denies  ASA/Blood Thinner Instructions: n/a   ERAS Protcol - yes PRE-SURGERY Ensure given at PAT  COVID TEST- n/a   Anesthesia review: no  Patient denies shortness of breath, fever, cough and chest pain at PAT appointment   All instructions explained to the patient, with a verbal understanding of the material. Patient agrees to go over the instructions while at home for a better understanding.  The opportunity to ask questions was provided.

## 2022-12-11 ENCOUNTER — Other Ambulatory Visit: Payer: Self-pay

## 2022-12-11 ENCOUNTER — Ambulatory Visit (INDEPENDENT_AMBULATORY_CARE_PROVIDER_SITE_OTHER): Payer: Medicaid Other | Admitting: Licensed Clinical Social Worker

## 2022-12-11 DIAGNOSIS — F32A Depression, unspecified: Secondary | ICD-10-CM | POA: Diagnosis not present

## 2022-12-11 DIAGNOSIS — F4010 Social phobia, unspecified: Secondary | ICD-10-CM

## 2022-12-11 DIAGNOSIS — F4310 Post-traumatic stress disorder, unspecified: Secondary | ICD-10-CM

## 2022-12-11 DIAGNOSIS — F331 Major depressive disorder, recurrent, moderate: Secondary | ICD-10-CM

## 2022-12-11 DIAGNOSIS — F419 Anxiety disorder, unspecified: Secondary | ICD-10-CM | POA: Diagnosis not present

## 2022-12-11 DIAGNOSIS — Z96651 Presence of right artificial knee joint: Secondary | ICD-10-CM

## 2022-12-11 NOTE — H&P (Signed)
TOTAL KNEE ADMISSION H&P  Patient is being admitted for right total knee arthroplasty.  Subjective:  Chief Complaint:right knee pain.  HPI: [name], 57 y.o. female, has a history of pain and functional disability in the right knee due to arthritis and has failed non-surgical conservative treatments for greater than 12 weeks to includeNSAID's and/or analgesics, corticosteriod injections, viscosupplementation injections, flexibility and strengthening excercises, use of assistive devices, weight reduction as appropriate, and activity modification.  Onset of symptoms was gradual, starting 3 years ago with gradually worsening course since that time. The patient noted no past surgery on the right knee(s).  Patient currently rates pain in the right knee(s) at 10 out of 10 with activity. Patient has night pain, worsening of pain with activity and weight bearing, pain that interferes with activities of daily living, pain with passive range of motion, crepitus, and joint swelling.  Patient has evidence of subchondral sclerosis, periarticular osteophytes, and joint space narrowing by imaging studies. There is no active infection.  Patient Active Problem List   Diagnosis Date Noted   Cervical stenosis of spine 08/04/2022   At risk for prolonged QT interval syndrome 10/19/2021   PTSD (post-traumatic stress disorder) 04/04/2021   MDD (major depressive disorder), recurrent episode, moderate (HCC) 04/04/2021   Social anxiety disorder 04/04/2021   Dizziness 11/04/2020   Epistaxis 11/04/2020   Chest pain of uncertain etiology 11/04/2020   Anxiety 09/23/2020   Attention and concentration deficit 09/23/2020   Degenerative spondylolisthesis 04/05/2020   Low back pain with radiation 02/06/2020   Bilateral lower extremity edema 04/07/2019   Pain in both lower extremities 04/07/2019   Numbness in both hands 04/07/2019   Pain in right arm 04/07/2019   Recurrent urinary tract infection 10/24/2018    Menopausal syndrome 10/24/2018   Abnormal mammogram 10/24/2018   Abdominal pain, epigastric    Marginal ulcers    Melena 07/10/2018   Alcohol use disorder, severe, in sustained remission (HCC) 07/10/2018   Encounter for screening for HIV 07/10/2018   Asthma 07/10/2018   Exposure to COVID-19 virus 05/28/2018   GERD (gastroesophageal reflux disease) 05/28/2018   Free intraperitoneal air 03/08/2018   Perforated ulcer (HCC) 03/08/2018   Cervical radiculopathy at C7 02/22/2018   Essential hypertension 08/02/2017   Hematoma of hip, left, initial encounter 06/28/2017   Neck pain 06/28/2017   Nonallopathic lesion of cervical region 06/28/2017   Nonallopathic lesion of thoracic region 06/28/2017   Nonallopathic lesion of lumbar region 06/28/2017   Biomechanical lesion, unspecified 06/28/2017   Muscle spasm 04/20/2017   URI (upper respiratory infection) 10/02/2016   Preventative health care 05/01/2016   Unilateral primary osteoarthritis, right knee 06/02/2015   Patellofemoral arthritis of left knee 05/04/2015   Cough 09/02/2013   Acute pharyngitis 09/02/2013   Sinusitis 06/25/2013   Dog bite of face 04/16/2013   Gastroenteritis 04/16/2013   Allergic rhinitis 04/16/2013   Mass of soft tissue 08/02/2012   RLS (restless legs syndrome) 01/21/2009   LYMPHOPENIA 03/23/2008   Depression 02/27/2008   HEMANGIOMA, SKIN 02/04/2008   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 02/19/2007   Past Medical History:  Diagnosis Date   Allergy    Anemia    Anxiety    Arthritis    Asthma    Depression    GERD (gastroesophageal reflux disease)    Heart murmur    from childhood- pt had ECHO 12/27/21   Hiatal hernia    Hypertension    Peptic ulcer    PTSD (  post-traumatic stress disorder)    UTI (urinary tract infection)     Past Surgical History:  Procedure Laterality Date   ABDOMINAL HYSTERECTOMY     fibroids   BIOPSY  07/11/2018   Procedure: BIOPSY;  Surgeon: Meryl Dare, MD;  Location: [facility]  ENDOSCOPY;  Service: Endoscopy;;   COLONOSCOPY  2014   ESOPHAGOGASTRODUODENOSCOPY (EGD) WITH PROPOFOL N/A 07/11/2018   Procedure: ESOPHAGOGASTRODUODENOSCOPY (EGD) WITH PROPOFOL;  Surgeon: Meryl Dare, MD;  Location: [facility] ENDOSCOPY;  Service: Endoscopy;  Laterality: N/A;   GASTRIC BYPASS  2007   HEMOSTASIS CLIP PLACEMENT  07/11/2018   Procedure: HEMOSTASIS CLIP PLACEMENT;  Surgeon: Meryl Dare, MD;  Location: [facility] ENDOSCOPY;  Service: Endoscopy;;   LAPAROTOMY N/A 03/08/2018   Procedure: EXPLORATORY LAPAROTOMY, REPAIR AND PATCH CLOSURE OF PERFORATED ULCER;  Surgeon: Violeta Gelinas, MD;  Location: [facility] OR;  Service: General;  Laterality: N/A;   TONSILLECTOMY     and adenoids removed as a child   TUBAL LIGATION      No current facility-administered medications for this encounter.   Current Outpatient Medications  Medication Sig Dispense Refill Last Dose   acetaminophen (TYLENOL) 650 MG CR tablet Take 1,300 mg by mouth every 8 (eight) hours as needed (pain).      busPIRone (BUSPAR) 15 MG tablet Take 1 tablet by mouth 2 (two) times daily. Take daily at 9 AM and at 4 PM 180 tablet 0    Cholecalciferol (VITAMIN D3) 50 MCG (2000 UT) capsule Take 1 capsule (2,000 Units total) by mouth daily. 100 capsule 0    colestipol (COLESTID) 1 g tablet Take 2 tablets (2 g total) by mouth 2 (two) times daily. Take 2 hours away from other medications. 120 tablet 11    cyanocobalamin (VITAMIN B12) 1000 MCG/ML injection Inject 1 mL (1,000 mcg total) into the muscle every 30 (thirty) days. 10 mL 0    cyclobenzaprine (FLEXERIL) 10 MG tablet Take 1 tablet (10 mg total) by mouth 3 (three) times daily as needed. for muscle spams 30 tablet 2    Diclofenac Sodium 2 % SOLN Apply 1 pump twice daily as needed. 112 g 1    estradiol (DOTTI) 0.1 MG/24HR patch Place 1 patch (0.1 mg total) onto the skin 2 (two) times a week. 8 patch 1    estradiol (ESTRACE) 0.1 MG/GM vaginal cream Apply a pea-sized amount of cream to the  fingertip and wipe in the front part of the vagina twice weekly 42.5 g 0    fluticasone (FLONASE) 50 MCG/ACT nasal spray Place 2 sprays into both nostrils daily. (Patient taking differently: Place 2 sprays into both nostrils daily as needed for allergies.) 16 g 5    furosemide (LASIX) 40 MG tablet Take 1 tablet (40 mg total) by mouth daily. 90 tablet 1    Homeopathic Products (ALLERGY MEDICINE PO) Take 1 tablet by mouth daily as needed (Takes the Clortabs. Equate Brand for allergies).      HYDROcodone-acetaminophen (NORCO/VICODIN) 5-325 MG tablet Take 1 tablet by mouth every 6 (six) hours as needed. 15 tablet 0    hydrOXYzine (VISTARIL) 25 MG capsule Take 1 capsule (25 mg total) by mouth 2 (two) times daily as needed for anxiety. And sleep, racing thoughts (Patient taking differently: Take 25 mg by mouth 2 (two) times daily. And sleep, racing thoughts) 60 capsule 1    loperamide (IMODIUM) 2 MG capsule Take 2 mg by mouth as needed for diarrhea or loose stools.      omeprazole (  PRILOSEC) 40 MG capsule Take 1 capsule (40 mg total) by mouth 2 (two) times daily. OPEN CAPSULE AND SPRINKLE ON APPLESAUCE. 60 capsule 11    OVER THE COUNTER MEDICATION Take 2 capsules by mouth daily. Mushroom Complex      Probiotic Product (ALIGN) CHEW Chew 2 tablets by mouth daily. Align pre and probiotic with vitamain C-take one tablet each Align pre and probiotic with Vitamin b12 for immune and energy-take one tablet each      QUEtiapine (SEROQUEL) 50 MG tablet Take 1 tablet (50 mg total) by mouth at bedtime. 30 tablet 0    rOPINIRole (REQUIP) 1 MG tablet Take 1 tablet (1 mg total) by mouth at bedtime. 90 tablet 1    sertraline (ZOLOFT) 100 MG tablet Take 1.5 tablets (150 mg total) by mouth daily for 15 days, THEN 2 tablets (200 mg total) daily for 15 days. (Patient taking differently:  2 tablets (200 mg total) daily ) 53 tablet 0    SYRINGE-NEEDLE, DISP, 3 ML (B-D 3CC LUER-LOK SYR 25GX5/8") 25G X 5/8" 3 ML MISC USE AS  DIRECTED WITH B-12 INJECTION 16 each 2    Allergies  Allergen Reactions   Lamictal [Lamotrigine] Rash   Penicillins Other (See Comments)    Did it involve swelling of the face/tongue/throat, SOB, or low BP? Unk Did it involve sudden or severe rash/hives, skin peeling, or any reaction on the inside of your mouth or nose? Unk Did you need to seek medical attention at a hospital or doctor's office? Unk When did it last happen? Was 57 years old; reaction not recalled     If all above answers are "NO", may proceed with cephalosporin use.    Codeine Itching    Crazy dreams and itching-pt said she can take it with food    Social History   Tobacco Use   Smoking status: Former    Current packs/day: 0.00    Types: Cigarettes    Quit date: 01/24/1987    Years since quitting: 35.9   Smokeless tobacco: Never  Substance Use Topics   Alcohol use: Not Currently    Comment: pt has not drank alcohol since 02/2020    Family History  Problem Relation Age of Onset   Stroke Mother    Alzheimer's disease Mother    Hypertension Father    Cancer Father        Prostate   Alcohol abuse Sister    Depression Sister    Alcohol abuse Brother    Depression Brother    Diabetes Maternal Grandfather    Liver disease Maternal Grandfather    Cancer Maternal Grandfather        Prostate   Diabetes Maternal Grandmother    Alzheimer's disease Maternal Grandmother    Diabetes Paternal Grandfather    Cancer Paternal Grandfather        Lung, Prostate   Diabetes Paternal Grandmother    Alzheimer's disease Paternal Grandmother    Colon cancer Neg Hx    Esophageal cancer Neg Hx    Pancreatic cancer Neg Hx    Stomach cancer Neg Hx    Colon polyps Neg Hx    Rectal cancer Neg Hx      Review of Systems  Objective:  Physical Exam Vitals reviewed.  Constitutional:      Appearance: Normal appearance. She is normal weight.  HENT:     Head: Normocephalic and atraumatic.  Eyes:     Extraocular Movements:  Extraocular movements intact.  Pupils: Pupils are equal, round, and reactive to light.  Cardiovascular:     Rate and Rhythm: Normal rate and regular rhythm.     Pulses: Normal pulses.  Pulmonary:     Effort: Pulmonary effort is normal.     Breath sounds: Normal breath sounds.  Abdominal:     Palpations: Abdomen is soft.  Musculoskeletal:     Cervical back: Normal range of motion and neck supple.     Right knee: Effusion and crepitus present. Decreased range of motion. Tenderness present over the lateral joint line. Abnormal alignment and abnormal meniscus.  Neurological:     Mental Status: She is alert and oriented to person, place, and time.  Psychiatric:        Behavior: Behavior normal.     Vital signs in last 24 hours:    Labs:   Estimated body mass index is 29.59 kg/m as calculated from the following:   Height as of 12/05/22: 5\' 2"  (1.575 m).   Weight as of 12/05/22: 73.4 kg.   Imaging Review Plain radiographs demonstrate severe degenerative joint disease of the right knee(s). The overall alignment ismild valgus. The bone quality appears to be excellent for age and reported activity level.      Assessment/Plan:  End stage arthritis, right knee   The patient history, physical examination, clinical judgment of the provider and imaging studies are consistent with end stage degenerative joint disease of the right knee(s) and total knee arthroplasty is deemed medically necessary. The treatment options including medical management, injection therapy arthroscopy and arthroplasty were discussed at length. The risks and benefits of total knee arthroplasty were presented and reviewed. The risks due to aseptic loosening, infection, stiffness, patella tracking problems, thromboembolic complications and other imponderables were discussed. The patient acknowledged the explanation, agreed to proceed with the plan and consent was signed. Patient is being admitted for inpatient  treatment for surgery, pain control, PT, OT, prophylactic antibiotics, VTE prophylaxis, progressive ambulation and ADL's and discharge planning. The patient is planning to be discharged home with home health services
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
## 2022-12-12 ENCOUNTER — Encounter (HOSPITAL_COMMUNITY)
Admission: RE | Disposition: A | Discharge status: Home Health | Payer: Self-pay | Source: Home / Self Care | Attending: Orthopaedic Surgery

## 2022-12-12 ENCOUNTER — Ambulatory Visit (HOSPITAL_COMMUNITY): Payer: Medicaid Other

## 2022-12-12 ENCOUNTER — Encounter (HOSPITAL_COMMUNITY): Payer: Self-pay | Admitting: Orthopaedic Surgery

## 2022-12-12 ENCOUNTER — Observation Stay (HOSPITAL_COMMUNITY): Payer: Medicaid Other

## 2022-12-12 ENCOUNTER — Other Ambulatory Visit: Payer: Self-pay

## 2022-12-12 ENCOUNTER — Observation Stay (HOSPITAL_COMMUNITY)
Admission: RE | Admit: 2022-12-12 | Discharge: 2022-12-14 | Disposition: A | Discharge status: Home Health | Payer: Medicaid Other | Attending: Orthopaedic Surgery | Admitting: Orthopaedic Surgery

## 2022-12-12 DIAGNOSIS — J45909 Unspecified asthma, uncomplicated: Secondary | ICD-10-CM | POA: Insufficient documentation

## 2022-12-12 DIAGNOSIS — I10 Essential (primary) hypertension: Secondary | ICD-10-CM

## 2022-12-12 DIAGNOSIS — Z87891 Personal history of nicotine dependence: Secondary | ICD-10-CM | POA: Insufficient documentation

## 2022-12-12 DIAGNOSIS — Z96651 Presence of right artificial knee joint: Secondary | ICD-10-CM

## 2022-12-12 DIAGNOSIS — M1711 Unilateral primary osteoarthritis, right knee: Secondary | ICD-10-CM | POA: Diagnosis not present

## 2022-12-12 DIAGNOSIS — Z79899 Other long term (current) drug therapy: Secondary | ICD-10-CM | POA: Insufficient documentation

## 2022-12-12 HISTORY — PX: TOTAL KNEE ARTHROPLASTY: SHX125

## 2022-12-12 SURGERY — ARTHROPLASTY, KNEE, TOTAL
Anesthesia: Regional | Site: Knee | Laterality: Right

## 2022-12-12 MED ORDER — AMISULPRIDE (ANTIEMETIC) 5 MG/2ML IV SOLN: 10.0000 mg | Freq: Once | INTRAVENOUS | Status: DC | PRN

## 2022-12-12 MED ORDER — FENTANYL CITRATE (PF) 100 MCG/2ML IJ SOLN
25.0000 ug | INTRAMUSCULAR | Status: DC | PRN
Start: 2022-12-12 — End: 2022-12-12
  Administered 2022-12-12: 50 ug via INTRAVENOUS

## 2022-12-12 MED ORDER — DOCUSATE SODIUM 100 MG PO CAPS
100.0000 mg | ORAL_CAPSULE | Freq: Two times a day (BID) | ORAL | Status: DC
  Administered 2022-12-12 – 2022-12-14 (×3): 100 mg via ORAL
  Filled 2022-12-12 (×4): qty 1

## 2022-12-12 MED ORDER — ROPINIROLE HCL 0.5 MG PO TABS
1.0000 mg | ORAL_TABLET | Freq: Every day | ORAL | Status: DC
  Administered 2022-12-12 – 2022-12-13 (×2): 1 mg via ORAL
  Filled 2022-12-12 (×2): qty 2

## 2022-12-12 MED ORDER — PROPOFOL 500 MG/50ML IV EMUL
INTRAVENOUS | Status: DC | PRN
  Administered 2022-12-12: 75 ug/kg/min via INTRAVENOUS
  Administered 2022-12-12: 20 mg via INTRAVENOUS

## 2022-12-12 MED ORDER — ALUM & MAG HYDROXIDE-SIMETH 200-200-20 MG/5ML PO SUSP: 30.0000 mL | ORAL | Status: DC | PRN

## 2022-12-12 MED ORDER — SODIUM CHLORIDE 0.9 % IR SOLN
Status: DC | PRN
  Administered 2022-12-12: 1

## 2022-12-12 MED ORDER — CHLORHEXIDINE GLUCONATE 0.12 % MT SOLN: 15.0000 mL | Freq: Once | OROMUCOSAL | Status: AC

## 2022-12-12 MED ORDER — METOCLOPRAMIDE HCL 5 MG PO TABS: 5.0000 mg | ORAL_TABLET | Freq: Three times a day (TID) | ORAL | Status: DC | PRN

## 2022-12-12 MED ORDER — FENTANYL CITRATE (PF) 100 MCG/2ML IJ SOLN
INTRAMUSCULAR | Status: AC
  Administered 2022-12-12: 50 ug via INTRAVENOUS
  Filled 2022-12-12: qty 2

## 2022-12-12 MED ORDER — MIDAZOLAM HCL 2 MG/2ML IJ SOLN: 1.0000 mg | Freq: Once | INTRAMUSCULAR | Status: AC

## 2022-12-12 MED ORDER — HYDROXYZINE HCL 10 MG PO TABS
25.0000 mg | ORAL_TABLET | Freq: Two times a day (BID) | ORAL | Status: DC
  Administered 2022-12-12 – 2022-12-14 (×4): 25 mg via ORAL
  Filled 2022-12-12 (×4): qty 3

## 2022-12-12 MED ORDER — BUPIVACAINE-EPINEPHRINE 0.25% -1:200000 IJ SOLN
INTRAMUSCULAR | Status: DC | PRN
Start: 2022-12-12 — End: 2022-12-12
  Administered 2022-12-12: 30 mL

## 2022-12-12 MED ORDER — VITAMIN D 25 MCG (1000 UNIT) PO TABS
2000.0000 [IU] | ORAL_TABLET | Freq: Every day | ORAL | Status: DC
  Administered 2022-12-13 – 2022-12-14 (×2): 2000 [IU] via ORAL
  Filled 2022-12-12 (×2): qty 2

## 2022-12-12 MED ORDER — FUROSEMIDE 40 MG PO TABS
40.0000 mg | ORAL_TABLET | Freq: Every day | ORAL | Status: DC
  Administered 2022-12-12 – 2022-12-14 (×3): 40 mg via ORAL
  Filled 2022-12-12 (×3): qty 1

## 2022-12-12 MED ORDER — FENTANYL CITRATE (PF) 100 MCG/2ML IJ SOLN
INTRAMUSCULAR | Status: AC
  Filled 2022-12-12: qty 2

## 2022-12-12 MED ORDER — BUPIVACAINE-EPINEPHRINE (PF) 0.25% -1:200000 IJ SOLN
INTRAMUSCULAR | Status: AC
  Filled 2022-12-12: qty 30

## 2022-12-12 MED ORDER — DIPHENHYDRAMINE HCL 12.5 MG/5ML PO ELIX: 12.5000 mg | ORAL_SOLUTION | ORAL | Status: DC | PRN

## 2022-12-12 MED ORDER — MIDAZOLAM HCL 2 MG/2ML IJ SOLN
INTRAMUSCULAR | Status: DC | PRN
  Administered 2022-12-12: 1 mg via INTRAVENOUS

## 2022-12-12 MED ORDER — PHENYLEPHRINE 80 MCG/ML (10ML) SYRINGE FOR IV PUSH (FOR BLOOD PRESSURE SUPPORT)
PREFILLED_SYRINGE | INTRAVENOUS | Status: DC | PRN
  Administered 2022-12-12: 80 ug via INTRAVENOUS

## 2022-12-12 MED ORDER — MIDAZOLAM HCL 2 MG/2ML IJ SOLN
INTRAMUSCULAR | Status: AC
  Filled 2022-12-12: qty 2

## 2022-12-12 MED ORDER — FENTANYL CITRATE (PF) 250 MCG/5ML IJ SOLN
INTRAMUSCULAR | Status: DC | PRN
  Administered 2022-12-12: 50 ug via INTRAVENOUS

## 2022-12-12 MED ORDER — ONDANSETRON HCL 4 MG/2ML IJ SOLN: 4.0000 mg | Freq: Four times a day (QID) | INTRAMUSCULAR | Status: DC | PRN

## 2022-12-12 MED ORDER — COLESTIPOL HCL 1 G PO TABS
2.0000 g | ORAL_TABLET | Freq: Two times a day (BID) | ORAL | Status: DC
  Administered 2022-12-12 – 2022-12-14 (×3): 2 g via ORAL
  Filled 2022-12-12 (×5): qty 2

## 2022-12-12 MED ORDER — FENTANYL CITRATE (PF) 250 MCG/5ML IJ SOLN
INTRAMUSCULAR | Status: AC
  Filled 2022-12-12: qty 5

## 2022-12-12 MED ORDER — PROPOFOL 1000 MG/100ML IV EMUL
INTRAVENOUS | Status: AC
  Filled 2022-12-12: qty 100

## 2022-12-12 MED ORDER — CYCLOBENZAPRINE HCL 10 MG PO TABS
10.0000 mg | ORAL_TABLET | Freq: Three times a day (TID) | ORAL | Status: DC | PRN
  Administered 2022-12-12: 10 mg via ORAL
  Filled 2022-12-12: qty 1

## 2022-12-12 MED ORDER — HYDROMORPHONE HCL 1 MG/ML IJ SOLN
0.5000 mg | INTRAMUSCULAR | Status: DC | PRN
  Administered 2022-12-12 – 2022-12-14 (×2): 1 mg via INTRAVENOUS
  Filled 2022-12-12 (×2): qty 1

## 2022-12-12 MED ORDER — SODIUM CHLORIDE 0.9 % IV SOLN: INTRAVENOUS | Status: AC

## 2022-12-12 MED ORDER — OXYCODONE HCL 5 MG PO TABS
10.0000 mg | ORAL_TABLET | ORAL | Status: DC | PRN
  Administered 2022-12-12 – 2022-12-13 (×4): 15 mg via ORAL
  Filled 2022-12-12 (×4): qty 3

## 2022-12-12 MED ORDER — SERTRALINE HCL 100 MG PO TABS
200.0000 mg | ORAL_TABLET | Freq: Every day | ORAL | Status: DC
  Administered 2022-12-13 – 2022-12-14 (×2): 200 mg via ORAL
  Filled 2022-12-12 (×2): qty 2

## 2022-12-12 MED ORDER — MENTHOL 3 MG MT LOZG: 1.0000 | LOZENGE | OROMUCOSAL | Status: DC | PRN

## 2022-12-12 MED ORDER — CHLORHEXIDINE GLUCONATE 0.12 % MT SOLN
OROMUCOSAL | Status: AC
  Administered 2022-12-12: 15 mL via OROMUCOSAL
  Filled 2022-12-12: qty 15

## 2022-12-12 MED ORDER — QUETIAPINE FUMARATE 25 MG PO TABS
50.0000 mg | ORAL_TABLET | Freq: Every day | ORAL | Status: DC
  Administered 2022-12-12 – 2022-12-13 (×2): 50 mg via ORAL
  Filled 2022-12-12 (×2): qty 2

## 2022-12-12 MED ORDER — ASPIRIN 81 MG PO CHEW
81.0000 mg | CHEWABLE_TABLET | Freq: Two times a day (BID) | ORAL | Status: DC
  Administered 2022-12-12 – 2022-12-14 (×4): 81 mg via ORAL
  Filled 2022-12-12 (×4): qty 1

## 2022-12-12 MED ORDER — TRANEXAMIC ACID-NACL 1000-0.7 MG/100ML-% IV SOLN
1000.0000 mg | INTRAVENOUS | Status: AC
  Administered 2022-12-12: 1000 mg via INTRAVENOUS
  Filled 2022-12-12: qty 100

## 2022-12-12 MED ORDER — ACETAMINOPHEN 325 MG PO TABS: 325.0000 mg | ORAL_TABLET | Freq: Four times a day (QID) | ORAL | Status: DC | PRN

## 2022-12-12 MED ORDER — MIDAZOLAM HCL 2 MG/2ML IJ SOLN
INTRAMUSCULAR | Status: AC
  Administered 2022-12-12: 1 mg via INTRAVENOUS
  Filled 2022-12-12: qty 2

## 2022-12-12 MED ORDER — BUSPIRONE HCL 5 MG PO TABS
15.0000 mg | ORAL_TABLET | Freq: Two times a day (BID) | ORAL | Status: DC
  Administered 2022-12-12 – 2022-12-14 (×4): 15 mg via ORAL
  Filled 2022-12-12 (×4): qty 1

## 2022-12-12 MED ORDER — PANTOPRAZOLE SODIUM 40 MG PO TBEC
40.0000 mg | DELAYED_RELEASE_TABLET | Freq: Every day | ORAL | Status: DC
  Administered 2022-12-12 – 2022-12-14 (×3): 40 mg via ORAL
  Filled 2022-12-12 (×3): qty 1

## 2022-12-12 MED ORDER — ONDANSETRON HCL 4 MG PO TABS: 4.0000 mg | ORAL_TABLET | Freq: Four times a day (QID) | ORAL | Status: DC | PRN

## 2022-12-12 MED ORDER — ROPIVACAINE HCL 5 MG/ML IJ SOLN
INTRAMUSCULAR | Status: DC | PRN
  Administered 2022-12-12: 25 mL via PERINEURAL

## 2022-12-12 MED ORDER — ORAL CARE MOUTH RINSE: 15.0000 mL | Freq: Once | OROMUCOSAL | Status: AC

## 2022-12-12 MED ORDER — POVIDONE-IODINE 10 % EX SWAB
2.0000 | Freq: Once | CUTANEOUS | Status: AC
  Administered 2022-12-12: 2 via TOPICAL

## 2022-12-12 MED ORDER — BUPIVACAINE HCL (PF) 0.75 % IJ SOLN
INTRAMUSCULAR | Status: DC | PRN
  Administered 2022-12-12: 1.6 mL

## 2022-12-12 MED ORDER — EPHEDRINE SULFATE-NACL 50-0.9 MG/10ML-% IV SOSY
PREFILLED_SYRINGE | INTRAVENOUS | Status: DC | PRN
  Administered 2022-12-12 (×3): 5 mg via INTRAVENOUS

## 2022-12-12 MED ORDER — METOCLOPRAMIDE HCL 5 MG/ML IJ SOLN: 5.0000 mg | Freq: Three times a day (TID) | INTRAMUSCULAR | Status: DC | PRN

## 2022-12-12 MED ORDER — CEFAZOLIN SODIUM-DEXTROSE 2-4 GM/100ML-% IV SOLN
2.0000 g | INTRAVENOUS | Status: AC
  Administered 2022-12-12: 2 g via INTRAVENOUS
  Filled 2022-12-12: qty 100

## 2022-12-12 MED ORDER — CEFAZOLIN SODIUM-DEXTROSE 1-4 GM/50ML-% IV SOLN
1.0000 g | Freq: Four times a day (QID) | INTRAVENOUS | Status: AC
  Administered 2022-12-12 – 2022-12-13 (×2): 1 g via INTRAVENOUS
  Filled 2022-12-12 (×2): qty 50

## 2022-12-12 MED ORDER — LACTATED RINGERS IV SOLN
INTRAVENOUS | Status: DC
Start: 2022-12-12 — End: 2022-12-12

## 2022-12-12 MED ORDER — FENTANYL CITRATE (PF) 100 MCG/2ML IJ SOLN: 50.0000 ug | Freq: Once | INTRAMUSCULAR | Status: AC

## 2022-12-12 MED ORDER — OXYCODONE HCL 5 MG PO TABS
5.0000 mg | ORAL_TABLET | ORAL | Status: DC | PRN
  Administered 2022-12-14: 10 mg via ORAL
  Filled 2022-12-12: qty 2

## 2022-12-12 MED ORDER — PHENOL 1.4 % MT LIQD: 1.0000 | OROMUCOSAL | Status: DC | PRN

## 2022-12-12 MED ORDER — 0.9 % SODIUM CHLORIDE (POUR BTL) OPTIME
TOPICAL | Status: DC | PRN
  Administered 2022-12-12: 1000 mL

## 2022-12-12 MED FILL — Fluticasone Propionate Nasal Susp 50 MCG/ACT: NASAL | 30 days supply | Qty: 16 | Fill #2 | Status: AC

## 2022-12-12 SURGICAL SUPPLY — 69 items
BAG COUNTER SPONGE SURGICOUNT (BAG) ×2 IMPLANT
BANDAGE ESMARK 6X9 LF (GAUZE/BANDAGES/DRESSINGS) IMPLANT
BLADE SAG 18X100X1.27 (BLADE) ×2 IMPLANT
BNDG ELASTIC 6X10 VLCR STRL LF (GAUZE/BANDAGES/DRESSINGS) IMPLANT
BNDG ELASTIC 6X5.8 VLCR STR LF (GAUZE/BANDAGES/DRESSINGS) ×4 IMPLANT
BNDG ESMARK 6X9 LF (GAUZE/BANDAGES/DRESSINGS) ×1
BOWL SMART MIX CTS (DISPOSABLE) IMPLANT
CATH FOLEY 2WAY 5CC 16FR (CATHETERS) ×1
CATH URTH STD 16FR FL 2W DRN (CATHETERS) IMPLANT
CEMENT BONE R 1X40 (Cement) IMPLANT
COMP FEM PERSONA SZ6 RT (Joint) ×1 IMPLANT
COMP PATELLA 3 PEG 29X9 (Joint) ×1 IMPLANT
COMPONENT FEM PERSONA SZ6 RT (Joint) IMPLANT
COMPONENT PATELLA 3 PEG 29X9 (Joint) IMPLANT
COOLER ICEMAN CLASSIC (MISCELLANEOUS) ×2 IMPLANT
COVER SURGICAL LIGHT HANDLE (MISCELLANEOUS) ×2 IMPLANT
CUFF TOURN SGL QUICK 34 (TOURNIQUET CUFF) ×1
CUFF TOURN SGL QUICK 42 (TOURNIQUET CUFF) IMPLANT
CUFF TRNQT CYL 34X4.125X (TOURNIQUET CUFF) ×2 IMPLANT
DRAPE EXTREMITY T 121X128X90 (DISPOSABLE) ×2 IMPLANT
DRAPE HALF SHEET 40X57 (DRAPES) ×2 IMPLANT
DRAPE U-SHAPE 47X51 STRL (DRAPES) ×2 IMPLANT
DURAPREP 26ML APPLICATOR (WOUND CARE) ×2 IMPLANT
ELECT CAUTERY BLADE 6.4 (BLADE) ×2 IMPLANT
ELECT REM PT RETURN 9FT ADLT (ELECTROSURGICAL) ×1
ELECTRODE REM PT RTRN 9FT ADLT (ELECTROSURGICAL) ×2 IMPLANT
FACESHIELD WRAPAROUND (MASK) ×2 IMPLANT
FACESHIELD WRAPAROUND OR TEAM (MASK) ×4 IMPLANT
GAUZE PAD ABD 8X10 STRL (GAUZE/BANDAGES/DRESSINGS) ×2 IMPLANT
GAUZE SPONGE 4X4 12PLY STRL (GAUZE/BANDAGES/DRESSINGS) ×2 IMPLANT
GAUZE XEROFORM 1X8 LF (GAUZE/BANDAGES/DRESSINGS) ×2 IMPLANT
GLOVE BIOGEL PI IND STRL 8 (GLOVE) ×4 IMPLANT
GLOVE ORTHO TXT STRL SZ7.5 (GLOVE) ×2 IMPLANT
GLOVE SURG ORTHO 8.0 STRL STRW (GLOVE) ×2 IMPLANT
GOWN STRL REUS W/ TWL LRG LVL3 (GOWN DISPOSABLE) IMPLANT
GOWN STRL REUS W/ TWL XL LVL3 (GOWN DISPOSABLE) ×4 IMPLANT
GOWN STRL REUS W/TWL LRG LVL3 (GOWN DISPOSABLE)
GOWN STRL REUS W/TWL XL LVL3 (GOWN DISPOSABLE) ×2
HANDPIECE INTERPULSE COAX TIP (DISPOSABLE) ×1
IMMOBILIZER KNEE 22 UNIV (SOFTGOODS) ×2 IMPLANT
INSERT TIB ASF VIV SZ 6-7 12H (Insert) IMPLANT
IV NS 1000ML (IV SOLUTION) ×1
IV NS 1000ML BAXH (IV SOLUTION) ×2 IMPLANT
KIT BASIN OR (CUSTOM PROCEDURE TRAY) ×2 IMPLANT
KIT TURNOVER KIT B (KITS) ×2 IMPLANT
MANIFOLD NEPTUNE II (INSTRUMENTS) ×2 IMPLANT
NDL 18GX1X1/2 (RX/OR ONLY) (NEEDLE) IMPLANT
NEEDLE 18GX1X1/2 (RX/OR ONLY) (NEEDLE) IMPLANT
NS IRRIG 1000ML POUR BTL (IV SOLUTION) ×2 IMPLANT
PACK TOTAL JOINT (CUSTOM PROCEDURE TRAY) ×2 IMPLANT
PAD ABD 8X10 STRL (GAUZE/BANDAGES/DRESSINGS) IMPLANT
PAD ARMBOARD 7.5X6 YLW CONV (MISCELLANEOUS) ×2 IMPLANT
PAD COLD SHLDR WRAP-ON (PAD) ×2 IMPLANT
PADDING CAST ABS COTTON 6X4 NS (CAST SUPPLIES) IMPLANT
PADDING CAST COTTON 6X4 STRL (CAST SUPPLIES) ×2 IMPLANT
SCREW FEMALE HEX FIX 25X2.5 (ORTHOPEDIC DISPOSABLE SUPPLIES) IMPLANT
SET HNDPC FAN SPRY TIP SCT (DISPOSABLE) ×2 IMPLANT
SET PAD KNEE POSITIONER (MISCELLANEOUS) ×2 IMPLANT
STAPLER VISISTAT 35W (STAPLE) ×2 IMPLANT
STEM TIB PS KNEE D 0D RT (Stem) IMPLANT
SUCTION TUBE FRAZIER 10FR DISP (SUCTIONS) ×2 IMPLANT
SUT VIC AB 0 CT1 27XBRD ANBCTR (SUTURE) ×2 IMPLANT
SUT VIC AB 1 CT1 27XBRD ANBCTR (SUTURE) ×4 IMPLANT
SUT VIC AB 2-0 CT1 TAPERPNT 27 (SUTURE) ×4 IMPLANT
SUT VIC AB CT1 27XBRD ANBCTRL (SUTURE) ×1
SYR 50ML LL SCALE MARK (SYRINGE) IMPLANT
TOWEL GREEN STERILE (TOWEL DISPOSABLE) ×2 IMPLANT
TOWEL GREEN STERILE FF (TOWEL DISPOSABLE) ×2 IMPLANT
TRAY CATH INTERMITTENT SS 16FR (CATHETERS) IMPLANT

## 2022-12-12 NOTE — Anesthesia Procedure Notes (Signed)
Anesthesia Regional Block: Adductor canal block   Pre-Anesthetic Checklist: , timeout performed,  Correct Patient, Correct Site, Correct Laterality,  Correct Procedure, Correct Position, site marked,  Risks and benefits discussed,  Surgical consent,  Pre-op evaluation,  At surgeon's request and post-op pain management  Laterality: Right  Prep: chloraprep       Needles:  Injection technique: Single-shot  Needle Type: Echogenic Stimulator Needle     Needle Length: 9cm  Needle Gauge: 21     Additional Needles:   Procedures:,,,, ultrasound used (permanent image in chart),,    Narrative:  Start time: 12/12/2022 11:10 AM End time: 12/12/2022 11:12 AM Injection made incrementally with aspirations every 5 mL.  Performed by: Personally  Anesthesiologist: Coldiron Nation, MD  Additional Notes: Discussed risks and benefits of the nerve block in detail, including but not limited vascular injury, permanent nerve damage and infection.   Patient tolerated the procedure well. Local anesthetic introduced in an incremental fashion under minimal resistance after negative aspirations. No paresthesias were elicited. After completion of the procedure, no acute issues were identified and patient continued to be monitored by RN.

## 2022-12-12 NOTE — Transfer of Care (Signed)
Immediate Anesthesia Transfer of Care Note  Patient: [name]  Procedure(s) Performed: RIGHT TOTAL KNEE ARTHROPLASTY (Right: Knee)  Patient Location: PACU  Anesthesia Type:Spinal  Level of Consciousness: awake, alert , and oriented  Airway & Oxygen Therapy: Patient Spontanous Breathing and Patient connected to face mask oxygen  Post-op Assessment: Report given to RN and Post -op Vital signs reviewed and stable  Post vital signs: Reviewed and stable  Last Vitals:  Vitals Value Taken Time  BP 110/61 12/12/22 1425  Temp    Pulse 70 12/12/22 1427  Resp 11 12/12/22 1427  SpO2 100 % 12/12/22 1427  Vitals shown include unfiled device data.  Last Pain:  Vitals:   12/12/22 1126  PainSc: 0-No pain      Patients Stated Pain Goal: 0 (12/12/22 1126)  Complications: No notable events documented.

## 2022-12-12 NOTE — Plan of Care (Signed)
  Problem: Education: Goal: Knowledge of General Education information will improve Description: Including pain rating scale, medication(s)/side effects and non-pharmacologic comfort measures Outcome: Progressing   Problem: Health Behavior/Discharge Planning: Goal: Ability to manage health-related needs will improve Outcome: Progressing   Problem: Clinical Measurements: Goal: Ability to maintain clinical measurements within normal limits will improve Outcome: Progressing Goal: Will remain free from infection Outcome: Progressing Goal: Diagnostic test results will improve Outcome: Progressing Goal: Respiratory complications will improve Outcome: Progressing Goal: Cardiovascular complication will be avoided Outcome: Progressing   Problem: Activity: Goal: Risk for activity intolerance will decrease Outcome: Progressing   Problem: Nutrition: Goal: Adequate nutrition will be maintained Outcome: Progressing   Problem: Coping: Goal: Level of anxiety will decrease Outcome: Progressing   Problem: Elimination: Goal: Will not experience complications related to bowel motility Outcome: Progressing Goal: Will not experience complications related to urinary retention Outcome: Progressing   Problem: Pain Management: Goal: General experience of comfort will improve Outcome: Progressing   Problem: Safety: Goal: Ability to remain free from injury will improve Outcome: Progressing   Problem: Skin Integrity: Goal: Risk for impaired skin integrity will decrease Outcome: Progressing   Problem: Education: Goal: Knowledge of the prescribed therapeutic regimen will improve Outcome: Progressing Goal: Individualized Educational Video(s) Outcome: Progressing   Problem: Activity: Goal: Ability to avoid complications of mobility impairment will improve Outcome: Progressing Goal: Range of joint motion will improve Outcome: Progressing   Problem: Clinical Measurements: Goal:  Postoperative complications will be avoided or minimized Outcome: Progressing   Problem: Pain Management: Goal: Pain level will decrease with appropriate interventions Outcome: Progressing   Problem: Skin Integrity: Goal: Will show signs of wound healing Outcome: Progressing

## 2022-12-12 NOTE — Op Note (Signed)
Operative Note  Date of operation: 12/12/2022 Preoperative diagnosis: Right knee primary osteoarthritis Postoperative diagnosis: Same  Procedure: Right press-fit total knee arthroplasty  Implants: Biomet/Zimmer persona press-fit knee system Implant Name Type Inv. Item Serial No. Manufacturer Lot No. LRB No. Used Action  STEM TIB PS KNEE D 0D RT - [id number] Stem STEM TIB PS KNEE D 0D RT  ZIMMER RECON(ORTH,TRAU,BIO,SG) [id number] Right 1 Implanted  COMP FEM PERSONA SZ6 RT - [id number] Joint COMP FEM PERSONA SZ6 RT  ZIMMER RECON(ORTH,TRAU,BIO,SG) [id number] Right 1 Implanted  INSERT TIB ASF VIV SZ 6-7 12H - [id number] Insert INSERT TIB ASF VIV SZ 6-7 12H  ZIMMER RECON(ORTH,TRAU,BIO,SG) [id number] Right 1 Implanted  COMP PATELLA 3 PEG 29X9 - [id number] Joint COMP PATELLA 3 PEG 29X9  ZIMMER RECON(ORTH,TRAU,BIO,SG) [id number] Right 1 Implanted   Surgeon: Vanita Panda. Magnus Ivan, MD Assistant: Rexene Edison, PA-C  Anesthesia: #1 right lower extremity adductor canal block, #3 spinal, #3 local Tourniquet time: Less than 1 hour EBL: Less than 100 cc Antibiotics: IV Ancef Complications: None  Indications: The patient is an active 57 year old female has been having worsening knee pain recently, the right knee.  She has tried and failed all forms of conservative treatment for the right knee.  Her plain films showed a lateral compartment arthritic changes and a MRI confirmed extensive arthritis throughout formality with degenerative meniscal tearing as well.  She understands that an arthroscopic intervention for that knee would not treat the arthritis and she is having a lot of pain with locking catching but a lot of her pain is the arthritic pain.  We recommended a total knee arthroplasty and explained that he may continue to help while that has been a recommendation for her.  She understands there are risk of acute blood loss anemia, nerve vessel injury, fracture, infection, DVT, implant failure and wound  healing issues.  She understands her goals are hopefully decrease pain, improve mobility, and improve quality of life.  Procedure description: After informed consent was obtained and the appropriate right knee was marked, anesthesia obtained a right lower extremity adductor canal block in the holding room.  The patient was then brought to the operating room and set up on the operating table where spinal anesthesia was obtained.  She was then laid in supine position on the operating table and a Foley catheter was placed.  A nonsterile tourniquet was placed around her upper right thigh the right thigh, knee knee, leg, and ankle were prepped and draped in DuraPrep and sterile drapes including a sterile stockinette.  A timeout was called and she was identified as the correct patient the correct right knee.  An Esmarch was then used to wrap out the leg and the turn was inflated to 300 mm of pressure.  With the knee extended a direct midline incision was made over the patella and this incision was carried proximally distally.  A medial parapatellar arthrotomy was then made finding a moderate joint effusion.  With the knee in a flexed position we found significant cartilage wear of the lateral compartment of the knee.  There are some tearing of the lateral meniscus as well.  We removed the lateral meniscus as well as medial meniscus and ACL.  We used an extramedullary cutting guide for making her proximal tibia cut correction for varus and valgus and a 5 degree slope.  We made this cut to take 2 mm off the low side and we did back down to more millimeters.  We then used a intramedullary cutting  guide for our right distal femur cut setting this for a right knee at 5 degrees externally rotated and a 10 mm distal femoral cut.  We made that cut without difficulty and believe that he unfortunately and we have achieved full extension with a 10 mm block.  We we will back to the femur and put a femoral sizing guide based off the  epicondylar axis.  Based on this we chose a size 6 femur.  We put a 4 in 1 cutting block for a size 6 femur and made the anterior and posterior cuts followed our chamfer cuts.  We then went back to the tibia and chose a size D right tibial tray for coverage of the tibial plateaus and rotation of the tibial tubercle and the femur.  We then did our drill hole and keel punch off of this finding excellent quality bone.  We prepared for press-fit implant.  We then trialed our size E right tibia combined with our size 6 right CR standard femur.  We trialed up to a 12 mm medial congruent right fixed-bearing polythene insert.  We replaced the range of motion and stability without insert.  We then made our patella cut and drilled 3 holes for size 29 patella button.  Again with all consultation in the knee we are pleased with range of motion and stability.  We then removed all trial cement from the knee and irrigate the knee with normal saline solution.  We dried a well and then placed our Marcaine with epinephrine around the arthrotomy.  With the knee in a flexed position we then placed our Biomet/Zimmer persona press-fit tibial tray for right knee size D followed by press fitting our size 6 right CR standard femur.  We placed a 12 mm medial congruent right polythene insert and press-fit our size 29 patella button.  Range of motion and stability were good so we let the tourniquet down and hemostasis was obtained with electrocautery.  The arthrotomy was closed interrupted #1 Vicryl suture over a 0 Vicryl there is deep tissue and 2-0 Vicryl close subcutaneous tissue.  The skin was reapproximated with staples.  Well-padded sterile dressing was applied.  The patient was taken recovery room.  Rexene Edison, PA-C did assist during the entire case and beginning the end and his assistance was crucial and medically necessary for soft tissue management and retraction, helping guide implant placement and a layered closure of the wound.

## 2022-12-12 NOTE — Evaluation (Signed)
Physical Therapy Evaluation Patient Details Name: [name] MRN: [id number] DOB: 10-24-1965 Today's Date: 12/12/2022  History of Present Illness  Patient is 57 y.o. female s/p Rt TKA on 12/12/22. PMH significant for anemia, anxiety, depression, OA, asthma, GERD, HTN, PTSD.   Clinical Impression  [name] is a 57 y.o. female POD 0 s/p Rt TKA. Patient reports independence with mobility at baseline. Patient is now limited by functional impairments (see PT problem list below) and requires Min Assist for transfers and gait with RW. Patient was able to transfer bed>chair with RW and min assist; pt c/o dizziness in standing that resolved with sitting. Patient instructed in exercise to facilitate ROM and circulation to manage edema. Patient will benefit from continued skilled PT interventions to address impairments and progress towards PLOF. Acute PT will follow to progress mobility and stair training in preparation for safe discharge home.         If plan is discharge home, recommend the following: A little help with walking and/or transfers;A little help with bathing/dressing/bathroom;Assistance with cooking/housework;Help with stairs or ramp for entrance;Assist for transportation   Can travel by private vehicle        Equipment Recommendations Rolling walker (2 wheels)  Recommendations for Other Services       Functional Status Assessment Patient has had a recent decline in their functional status and demonstrates the ability to make significant improvements in function in a reasonable and predictable amount of time.     Precautions / Restrictions Precautions Precautions: Fall Restrictions Weight Bearing Restrictions: No RLE Weight Bearing: Weight bearing as tolerated      Mobility  Bed Mobility Overal bed mobility: Needs Assistance Bed Mobility: Supine to Sit     Supine to sit: Min assist, HOB elevated, Used rails     General bed mobility comments: cues to sequence  moving LE's to EOB, assist to bring Rt LE off side.    Transfers Overall transfer level: Needs assistance Equipment used: Rolling walker (2 wheels) Transfers: Sit to/from Stand, Bed to chair/wheelchair/BSC Sit to Stand: Contact guard assist   Step pivot transfers: Min assist       General transfer comment: cues for hand placement to power up to RW. Min assist to guide turn bed>chair and steady balance.    Ambulation/Gait                  Stairs            Wheelchair Mobility     Tilt Bed    Modified Rankin (Stroke Patients Only)       Balance Overall balance assessment: Needs assistance Sitting-balance support: Feet supported Sitting balance-Leahy Scale: Fair     Standing balance support: During functional activity, Reliant on assistive device for balance, Bilateral upper extremity supported Standing balance-Leahy Scale: Poor                               Pertinent Vitals/Pain Pain Assessment Pain Assessment: Faces Faces Pain Scale: Hurts even more Pain Location: Rt knee Pain Descriptors / Indicators: Aching, Discomfort, Guarding, Grimacing Pain Intervention(s): Limited activity within patient's tolerance, Monitored during session, Repositioned, Ice applied    Home Living Family/patient expects to be discharged to:: Private residence Living Arrangements: Spouse/significant other Available Help at Discharge: Family Type of Home: House Home Access: Stairs to enter Entrance Stairs-Rails: Left Entrance Stairs-Number of Steps: 4   Home Layout: One level Home Equipment: None  Prior Function Prior Level of Function : Independent/Modified Independent             Mobility Comments: ind no AD ADLs Comments: ind     Extremity/Trunk Assessment   Upper Extremity Assessment Upper Extremity Assessment: Overall WFL for tasks assessed    Lower Extremity Assessment Lower Extremity Assessment: Overall WFL for tasks assessed;RLE  deficits/detail RLE Deficits / Details: good quad activation, no extensor lag with SLR. Df/PF 4/5. RLE Sensation: WNL RLE Coordination: WNL    Cervical / Trunk Assessment Cervical / Trunk Assessment: Normal  Communication   Communication Communication: No apparent difficulties  Cognition Arousal: Alert Behavior During Therapy: WFL for tasks assessed/performed Overall Cognitive Status: Within Functional Limits for tasks assessed                                          General Comments      Exercises Total Joint Exercises Ankle Circles/Pumps: AROM, Both, 20 reps Quad Sets: AROM, Right, 5 reps Heel Slides: AAROM, Right, 5 reps   Assessment/Plan    PT Assessment Patient needs continued PT services  PT Problem List Decreased strength;Decreased range of motion;Decreased activity tolerance;Decreased balance;Decreased mobility;Decreased knowledge of use of DME;Decreased knowledge of precautions;Pain       PT Treatment Interventions DME instruction;Patient/family education;Cognitive remediation;Neuromuscular re-education;Balance training;Therapeutic exercise;Therapeutic activities;Functional mobility training;Stair training;Gait training    PT Goals (Current goals can be found in the Care Plan section)  Acute Rehab PT Goals Patient Stated Goal: return home PT Goal Formulation: With patient Time For Goal Achievement: 12/26/22 Potential to Achieve Goals: Good    Frequency 7X/week     Co-evaluation               AM-PAC PT "6 Clicks" Mobility  Outcome Measure Help needed turning from your back to your side while in a flat bed without using bedrails?: A Little Help needed moving from lying on your back to sitting on the side of a flat bed without using bedrails?: A Little Help needed moving to and from a bed to a chair (including a wheelchair)?: A Little Help needed standing up from a chair using your arms (e.g., wheelchair or bedside chair)?: A  Little Help needed to walk in hospital room?: A Little Help needed climbing 3-5 steps with a railing? : A Lot 6 Click Score: 17    End of Session Equipment Utilized During Treatment: Gait belt Activity Tolerance: Patient tolerated treatment well Patient left: in chair;with call bell/phone within reach;with chair alarm set;with family/visitor present Nurse Communication: Mobility status PT Visit Diagnosis: Muscle weakness (generalized) (M62.81);Difficulty in walking, not elsewhere classified (R26.2);Other abnormalities of gait and mobility (R26.89);Pain Pain - Right/Left: Right Pain - part of body: Knee    Time: 1710-1734 PT Time Calculation (min) (ACUTE ONLY): 24 min   Charges:   PT Evaluation $PT Eval Low Complexity: 1 Low PT Treatments $Therapeutic Exercise: 8-22 mins PT General Charges $$ ACUTE PT VISIT: 1 Visit         Wynn Maudlin, DPT Acute Rehabilitation Services Office [phone]  12/12/22 5:51 PM

## 2022-12-12 NOTE — Interval H&P Note (Signed)
History and Physical Interval Note: The patient understands that she is here today for right total knee replacement to treat her severe right knee arthritis.  There has been no acute or interval change in her medical status.  See H&P.  The risks and benefits of surgery been discussed in detail and informed consent has been obtained.  The right operative knee has been marked.  12/12/2022 11:22 AM  [name]  has presented today for surgery, with the diagnosis of osteoarthritis right knee.  The various methods of treatment have been discussed with the patient and family. After consideration of risks, benefits and other options for treatment, the patient has consented to  Procedure(s): RIGHT TOTAL KNEE ARTHROPLASTY (Right) as a surgical intervention.  The patient's history has been reviewed, patient examined, no change in status, stable for surgery.  I have reviewed the patient's chart and labs.  Questions were answered to the patient's satisfaction.     Kathryne Hitch

## 2022-12-12 NOTE — Anesthesia Preprocedure Evaluation (Signed)
Anesthesia Evaluation  Patient identified by MRN, date of birth, ID band Patient awake    Reviewed: Allergy & Precautions, H&P , NPO status , Patient's Chart, lab work & pertinent test results  Airway Mallampati: II  TM Distance: >3 FB Neck ROM: Full    Dental no notable dental hx.    Pulmonary asthma , former smoker   Pulmonary exam normal breath sounds clear to auscultation       Cardiovascular hypertension, Normal cardiovascular exam Rhythm:Regular Rate:Normal  TTE 12/23  1. Left ventricular ejection fraction, by estimation, is 60 to 65%. The  left ventricle has normal function. The left ventricle has no regional  wall motion abnormalities. Left ventricular diastolic parameters were  normal. The average left ventricular  global longitudinal strain is -22.1 %.   2. Right ventricular systolic function is normal. The right ventricular  size is normal.   3. The mitral valve is normal in structure. Mild mitral valve  regurgitation. No evidence of mitral stenosis.   4. The aortic valve is tricuspid. Aortic valve regurgitation is not  visualized. No aortic stenosis is present.   5. The inferior vena cava is normal in size with greater than 50%  respiratory variability, suggesting right atrial pressure of 3 mmHg.     Neuro/Psych  PSYCHIATRIC DISORDERS Anxiety Depression    Cervical radiculopathy  negative neurological ROS     GI/Hepatic hiatal hernia, PUD,GERD  ,,Hx of alcohol abuse   Endo/Other  negative endocrine ROS    Renal/GU negative Renal ROS  negative genitourinary   Musculoskeletal  (+) Arthritis ,    Abdominal   Peds negative pediatric ROS (+)  Hematology  (+) Blood dyscrasia, anemia   Anesthesia Other Findings   Reproductive/Obstetrics negative OB ROS                             Anesthesia Physical Anesthesia Plan  ASA: 3  Anesthesia Plan: Spinal and Regional   Post-op  Pain Management:    Induction:   PONV Risk Score and Plan: Ondansetron and Dexamethasone  Airway Management Planned: Natural Airway  Additional Equipment:   Intra-op Plan:   Post-operative Plan:   Informed Consent: I have reviewed the patients History and Physical, chart, labs and discussed the procedure including the risks, benefits and alternatives for the proposed anesthesia with the patient or authorized representative who has indicated his/her understanding and acceptance.     Dental advisory given  Plan Discussed with: CRNA  Anesthesia Plan Comments:         Anesthesia Quick Evaluation

## 2022-12-12 NOTE — Discharge Instructions (Signed)

## 2022-12-12 NOTE — Anesthesia Procedure Notes (Signed)
Spinal  Patient location during procedure: OR Start time: 12/12/2022 12:20 PM End time: 12/12/2022 12:25 PM Reason for block: surgical anesthesia Staffing Performed: anesthesiologist  Anesthesiologist: [place] Nation, MD Performed by:  Nation, MD Authorized by: Marcene Duos, MD   Preanesthetic Checklist Completed: patient identified, IV checked, site marked, risks and benefits discussed, surgical consent, monitors and equipment checked, pre-op evaluation and timeout performed Spinal Block Patient position: sitting Prep: DuraPrep Patient monitoring: heart rate, cardiac monitor, continuous pulse ox and blood pressure Approach: midline Location: L4-5 Needle Needle type: Pencan  Needle gauge: 24 G Assessment Sensory level: T6 Additional Notes Functioning IV was confirmed and monitors were applied. Sterile prep and drape, including hand hygiene and sterile gloves were used. The patient was positioned and the spine was prepped. The skin was anesthetized with lidocaine.  Free flow of clear CSF was obtained prior to injecting local anesthetic into the CSF.  The spinal needle aspirated freely following injection.  The needle was carefully withdrawn.  The patient tolerated the procedure well.

## 2022-12-13 ENCOUNTER — Encounter (HOSPITAL_COMMUNITY): Payer: Self-pay | Admitting: Orthopaedic Surgery

## 2022-12-13 DIAGNOSIS — M1711 Unilateral primary osteoarthritis, right knee: Secondary | ICD-10-CM | POA: Diagnosis not present

## 2022-12-13 LAB — CBC
HCT: 31.2 % — ABNORMAL LOW (ref 36.0–46.0)
Hemoglobin: 10.4 g/dL — ABNORMAL LOW (ref 12.0–15.0)
MCH: 30.3 pg (ref 26.0–34.0)
MCHC: 33.3 g/dL (ref 30.0–36.0)
MCV: 91 fL (ref 80.0–100.0)
Platelets: 132 10*3/uL — ABNORMAL LOW (ref 150–400)
RBC: 3.43 MIL/uL — ABNORMAL LOW (ref 3.87–5.11)
RDW: 12.3 % (ref 11.5–15.5)
WBC: 6.4 10*3/uL (ref 4.0–10.5)
nRBC: 0 % (ref 0.0–0.2)

## 2022-12-13 LAB — BASIC METABOLIC PANEL
Anion gap: 6 (ref 5–15)
BUN: 8 mg/dL (ref 6–20)
CO2: 27 mmol/L (ref 22–32)
Calcium: 8.4 mg/dL — ABNORMAL LOW (ref 8.9–10.3)
Chloride: 102 mmol/L (ref 98–111)
Creatinine, Ser: 0.88 mg/dL (ref 0.44–1.00)
GFR, Estimated: >60 mL/min (ref >60)
Glucose, Bld: 134 mg/dL — ABNORMAL HIGH (ref 70–99)
Potassium: 3.4 mmol/L — ABNORMAL LOW (ref 3.5–5.1)
Sodium: 135 mmol/L (ref 135–145)

## 2022-12-13 MED ORDER — KETOROLAC TROMETHAMINE 15 MG/ML IJ SOLN
7.5000 mg | Freq: Four times a day (QID) | INTRAMUSCULAR | Status: AC
Start: 2022-12-13 — End: 2022-12-13
  Administered 2022-12-13 (×3): 7.5 mg via INTRAVENOUS
  Filled 2022-12-13 (×3): qty 1

## 2022-12-13 NOTE — Progress Notes (Signed)
Physical Therapy Treatment Patient Details Name: [name] MRN: [id number] DOB: [birth year] Today's Date: 12/13/2022   History of Present Illness Patient is 57 y.o. female s/p Rt TKA on 12/12/22. PMH significant for anemia, anxiety, depression, OA, asthma, GERD, HTN, PTSD.    PT Comments  Patient resting in bed at start of session and agreeable to therapy. Min assist and cues for sequencing supine>sit EOB. Educated on use of gait belt to assist with Rt LE off EOB. Min assist for stand and steps to turn to [facility] as pt reporting urge to void bladder. Pt able to complete pericare sitting. Ambulation progressed to hallway, ~25' with RW, pt limited by knee pain and dizziness. BP remained low during visit, monitored throughout. EOS pt agreeable to remain in recliner and performed exercises for ROM and circulation. Will continue to progress pt as able during acute stay.  Vitals During Session:  Position BP (MAP) HR  Supine 95/58 (70) 73  Seated EOB 88/58 (69) 75  Seated post BSC 89/55 (67) 76  Seated post gait 79/55 (63) 95      If plan is discharge home, recommend the following: A little help with walking and/or transfers;A little help with bathing/dressing/bathroom;Assistance with cooking/housework;Help with stairs or ramp for entrance;Assist for transportation   Can travel by private vehicle        Equipment Recommendations  Rolling walker (2 wheels)    Recommendations for Other Services       Precautions / Restrictions Precautions Precautions: Fall Restrictions Weight Bearing Restrictions: No RLE Weight Bearing: Weight bearing as tolerated     Mobility  Bed Mobility Overal bed mobility: Needs Assistance Bed Mobility: Supine to Sit     Supine to sit: Min assist, HOB elevated, Used rails     General bed mobility comments: cues to sequence moving LE's to EOB, cues for use of belt to bring Rt LE off side.    Transfers Overall transfer level: Needs assistance Equipment  used: Rolling walker (2 wheels) Transfers: Sit to/from Stand, Bed to chair/wheelchair/BSC Sit to Stand: Contact guard assist   Step pivot transfers: Min assist       General transfer comment: cues for hand placement to power up to RW. Min assist to guide turn bed>BSC then chair.    Ambulation/Gait Ambulation/Gait assistance: Min assist Gait Distance (Feet): 25 Feet Assistive device: Rolling walker (2 wheels) Gait Pattern/deviations: Step-to pattern, Decreased stance time - right, Decreased stride length, Decreased weight shift to right, Trunk flexed Gait velocity: decr     General Gait Details: cues for step to pattern with RW and for proximity to RW throughout. steps short and shuffled, limited by pain and dizziness.   Stairs             Wheelchair Mobility     Tilt Bed    Modified Rankin (Stroke Patients Only)       Balance Overall balance assessment: Needs assistance Sitting-balance support: Feet supported Sitting balance-Leahy Scale: Fair     Standing balance support: During functional activity, Reliant on assistive device for balance, Bilateral upper extremity supported Standing balance-Leahy Scale: Poor                              Cognition Arousal: Alert Behavior During Therapy: WFL for tasks assessed/performed Overall Cognitive Status: Within Functional Limits for tasks assessed  Exercises Total Joint Exercises Ankle Circles/Pumps: AROM, Both, 20 reps Quad Sets: AROM, Right, 5 reps Heel Slides: AAROM, Right, 5 reps Hip ABduction/ADduction: AAROM, Right, 5 reps    General Comments        Pertinent Vitals/Pain Pain Assessment Pain Assessment: Faces Faces Pain Scale: Hurts little more Pain Location: Rt knee Pain Descriptors / Indicators: Aching, Discomfort, Guarding, Grimacing Pain Intervention(s): Limited activity within patient's tolerance, Monitored during session,  Premedicated before session, Repositioned    Home Living                          Prior Function            PT Goals (current goals can now be found in the care plan section) Acute Rehab PT Goals Patient Stated Goal: return home PT Goal Formulation: With patient Time For Goal Achievement: 12/26/22 Potential to Achieve Goals: Good Progress towards PT goals: Progressing toward goals    Frequency    7X/week      PT Plan      Co-evaluation              AM-PAC PT "6 Clicks" Mobility   Outcome Measure  Help needed turning from your back to your side while in a flat bed without using bedrails?: A Little Help needed moving from lying on your back to sitting on the side of a flat bed without using bedrails?: A Little Help needed moving to and from a bed to a chair (including a wheelchair)?: A Little Help needed standing up from a chair using your arms (e.g., wheelchair or bedside chair)?: A Little Help needed to walk in hospital room?: A Little Help needed climbing 3-5 steps with a railing? : A Lot 6 Click Score: 17    End of Session Equipment Utilized During Treatment: Gait belt Activity Tolerance: Patient tolerated treatment well Patient left: in chair;with call bell/phone within reach;with chair alarm set;with family/visitor present Nurse Communication: Mobility status PT Visit Diagnosis: Muscle weakness (generalized) (M62.81);Difficulty in walking, not elsewhere classified (R26.2);Other abnormalities of gait and mobility (R26.89);Pain Pain - Right/Left: Right Pain - part of body: Knee     Time: 4166-0630 PT Time Calculation (min) (ACUTE ONLY): 45 min  Charges:    $Gait Training: 8-22 mins $Therapeutic Exercise: 8-22 mins $Therapeutic Activity: 8-22 mins PT General Charges $$ ACUTE PT VISIT: 1 Visit                     Wynn Maudlin, DPT Acute Rehabilitation Services Office [phone]  12/13/22 3:52 PM

## 2022-12-13 NOTE — Progress Notes (Signed)
Subjective: 1 Day Post-Op Procedure(s) (LRB): RIGHT TOTAL KNEE ARTHROPLASTY (Right) Patient reports pain as moderate.    Objective: Vital signs in last 24 hours: Temp:  [97.3 F (36.3 C)-98.7 F (37.1 C)] 98.5 F (36.9 C) (11/20 0439) Pulse Rate:  [59-78] 72 (11/20 0439) Resp:  [7-21] 15 (11/20 0439) BP: (83-128)/(52-85) 90/52 (11/20 0439) SpO2:  [96 %-100 %] 97 % (11/20 0439) Weight:  [71.2 kg] 71.2 kg (11/19 0957)  Intake/Output from previous day: 11/19 0701 - 11/20 0700 In: 1144.7 [I.V.:1044.7; IV Piggyback:100] Out: 945 [Urine:925; Blood:20] Intake/Output this shift: No intake/output data recorded.  No results for input(s): "HGB" in the last 72 hours. No results for input(s): "WBC", "RBC", "HCT", "PLT" in the last 72 hours. No results for input(s): "NA", "K", "CL", "CO2", "BUN", "CREATININE", "GLUCOSE", "CALCIUM" in the last 72 hours. No results for input(s): "LABPT", "INR" in the last 72 hours.  Sensation intact distally Intact pulses distally Dorsiflexion/Plantar flexion intact Incision: dressing C/D/I Compartment soft   Assessment/Plan: 1 Day Post-Op Procedure(s) (LRB): RIGHT TOTAL KNEE ARTHROPLASTY (Right) Up with therapy     Kathryne Hitch 12/13/2022, 7:41 AM

## 2022-12-13 NOTE — Progress Notes (Signed)
Physical Therapy Treatment Patient Details Name: [name] MRN: [id number] DOB: 08-02-1965 Today's Date: 12/13/2022   History of Present Illness Patient is 57 y.o. female s/p Rt TKA on 12/12/22. PMH significant for anemia, anxiety, depression, OA, asthma, GERD, HTN, PTSD.    PT Comments  Pt resting in recliner at start of session and reporting urge to void bladder. Good carryover from prior visit and pt required CGA for sit<>stand from recliner and to pivot chair>BSC with supervision for seated balance to complete pericare. Pt amb short bout in room but remains limited by pain in knee, lightheadedness, and overheated sensation. EOS pt educated on use of belt to raise Rt LE onto bed and completed supine exercises for ROM/circulation. Will continue to progress pt as able during stay.   If plan is discharge home, recommend the following: A little help with walking and/or transfers;A little help with bathing/dressing/bathroom;Assistance with cooking/housework;Help with stairs or ramp for entrance;Assist for transportation   Can travel by private vehicle        Equipment Recommendations  Rolling walker (2 wheels)    Recommendations for Other Services       Precautions / Restrictions Precautions Precautions: Fall Restrictions Weight Bearing Restrictions: No RLE Weight Bearing: Weight bearing as tolerated     Mobility  Bed Mobility Overal bed mobility: Needs Assistance Bed Mobility: Sit to Supine     Supine to sit: Min assist, HOB elevated, Used rails Sit to supine: Contact guard assist, HOB elevated   General bed mobility comments: cues for use of belt to raise Rt LE onto bed, CGA for safety, pt taking extra time.    Transfers Overall transfer level: Needs assistance Equipment used: Rolling walker (2 wheels) Transfers: Sit to/from Stand, Bed to chair/wheelchair/BSC Sit to Stand: Contact guard assist   Step pivot transfers: Contact guard assist       General transfer  comment: good carryover for sit<>stand from recliner and BSC. CGA for safety.    Ambulation/Gait Ambulation/Gait assistance: Contact guard assist Gait Distance (Feet): 15 Feet Assistive device: Rolling walker (2 wheels) Gait Pattern/deviations: Step-to pattern, Decreased stance time - right, Decreased stride length, Decreased weight shift to right, Trunk flexed Gait velocity: decr     General Gait Details: pt with good carryover from AM session to maintain safe position to RW. limited by pain and dizzy/overheated sensation.   Stairs             Wheelchair Mobility     Tilt Bed    Modified Rankin (Stroke Patients Only)       Balance Overall balance assessment: Needs assistance Sitting-balance support: Feet supported Sitting balance-Leahy Scale: Fair     Standing balance support: During functional activity, Reliant on assistive device for balance, Bilateral upper extremity supported Standing balance-Leahy Scale: Poor                              Cognition Arousal: Alert Behavior During Therapy: WFL for tasks assessed/performed Overall Cognitive Status: Within Functional Limits for tasks assessed                                          Exercises Total Joint Exercises Ankle Circles/Pumps: AROM, Both, 20 reps Quad Sets: AROM, Right, 5 reps Heel Slides: AAROM, Right, 5 reps Hip ABduction/ADduction: AAROM, Right, 5 reps    General Comments  Pertinent Vitals/Pain Pain Assessment Pain Assessment: Faces Faces Pain Scale: Hurts even more Pain Location: Rt knee with Rom exercises Pain Descriptors / Indicators: Aching, Discomfort, Guarding, Grimacing Pain Intervention(s): Limited activity within patient's tolerance, Monitored during session, Repositioned    Home Living                          Prior Function            PT Goals (current goals can now be found in the care plan section) Acute Rehab PT  Goals Patient Stated Goal: return home PT Goal Formulation: With patient Time For Goal Achievement: 12/26/22 Potential to Achieve Goals: Good Progress towards PT goals: Progressing toward goals    Frequency    7X/week      PT Plan      Co-evaluation              AM-PAC PT "6 Clicks" Mobility   Outcome Measure  Help needed turning from your back to your side while in a flat bed without using bedrails?: A Little Help needed moving from lying on your back to sitting on the side of a flat bed without using bedrails?: A Little Help needed moving to and from a bed to a chair (including a wheelchair)?: A Little Help needed standing up from a chair using your arms (e.g., wheelchair or bedside chair)?: A Little Help needed to walk in hospital room?: A Little Help needed climbing 3-5 steps with a railing? : A Lot 6 Click Score: 17    End of Session Equipment Utilized During Treatment: Gait belt Activity Tolerance: Patient tolerated treatment well Patient left: in chair;with call bell/phone within reach;with chair alarm set;with family/visitor present Nurse Communication: Mobility status PT Visit Diagnosis: Muscle weakness (generalized) (M62.81);Difficulty in walking, not elsewhere classified (R26.2);Other abnormalities of gait and mobility (R26.89);Pain Pain - Right/Left: Right Pain - part of body: Knee     Time: 8295-6213 PT Time Calculation (min) (ACUTE ONLY): 25 min  Charges:    $Gait Training: 8-22 mins $Therapeutic Activity: 8-22 mins PT General Charges $$ ACUTE PT VISIT: 1 Visit                     Wynn Maudlin, DPT Acute Rehabilitation Services Office [phone]  12/13/22 4:08 PM

## 2022-12-13 NOTE — Plan of Care (Signed)
  Problem: Education: Goal: Knowledge of General Education information will improve Description: Including pain rating scale, medication(s)/side effects and non-pharmacologic comfort measures Outcome: Progressing   Problem: Health Behavior/Discharge Planning: Goal: Ability to manage health-related needs will improve Outcome: Progressing   Problem: Clinical Measurements: Goal: Ability to maintain clinical measurements within normal limits will improve Outcome: Progressing Goal: Will remain free from infection Outcome: Progressing Goal: Diagnostic test results will improve Outcome: Progressing Goal: Respiratory complications will improve Outcome: Progressing Goal: Cardiovascular complication will be avoided Outcome: Progressing   Problem: Activity: Goal: Risk for activity intolerance will decrease Outcome: Progressing   Problem: Nutrition: Goal: Adequate nutrition will be maintained Outcome: Progressing   Problem: Coping: Goal: Level of anxiety will decrease Outcome: Progressing   Problem: Elimination: Goal: Will not experience complications related to bowel motility Outcome: Progressing Goal: Will not experience complications related to urinary retention Outcome: Progressing   Problem: Pain Management: Goal: General experience of comfort will improve Outcome: Progressing   Problem: Safety: Goal: Ability to remain free from injury will improve Outcome: Progressing   Problem: Skin Integrity: Goal: Risk for impaired skin integrity will decrease Outcome: Progressing   Problem: Education: Goal: Knowledge of the prescribed therapeutic regimen will improve Outcome: Progressing Goal: Individualized Educational Video(s) Outcome: Progressing   Problem: Activity: Goal: Ability to avoid complications of mobility impairment will improve Outcome: Progressing Goal: Range of joint motion will improve Outcome: Progressing   Problem: Clinical Measurements: Goal:  Postoperative complications will be avoided or minimized Outcome: Progressing   Problem: Pain Management: Goal: Pain level will decrease with appropriate interventions Outcome: Progressing   Problem: Skin Integrity: Goal: Will show signs of wound healing Outcome: Progressing

## 2022-12-13 NOTE — Progress Notes (Signed)
CSW met with pt regarding SDOH: food.  Pt from [facility], reports she lives with husband, does have income, does have food stamps.  Pt reports things do get a "little tight" sometimes but they are able to get the food they need and she has family support if that is ever needed for food.  She has used [employer] in the past but declined CSW offer of list of food pantries in [place]. Daleen Squibb, MSW, LCSW 11/20/202411:44 AM

## 2022-12-13 NOTE — Progress Notes (Cosign Needed)
    Durable Medical Equipment  (From admission, onward)           Start     Ordered   12/13/22 1538  DME 3 n 1  Once       Comments: Bedside commode, confine to one room   12/13/22 1537   12/12/22 1545  DME Walker rolling  Once       Question Answer Comment  Walker: With 5 Inch Wheels   Patient needs a walker to treat with the following condition Status post total right knee replacement      12/12/22 1544

## 2022-12-13 NOTE — Plan of Care (Signed)
  Problem: Education: Goal: Knowledge of General Education information will improve Description: Including pain rating scale, medication(s)/side effects and non-pharmacologic comfort measures Outcome: Progressing   Problem: Clinical Measurements: Goal: Will remain free from infection Outcome: Progressing   Problem: Activity: Goal: Risk for activity intolerance will decrease Outcome: Progressing   Problem: Nutrition: Goal: Adequate nutrition will be maintained Outcome: Progressing   Problem: Elimination: Goal: Will not experience complications related to bowel motility Outcome: Progressing   Problem: Pain Management: Goal: General experience of comfort will improve Outcome: Progressing   Problem: Education: Goal: Knowledge of the prescribed therapeutic regimen will improve Outcome: Progressing

## 2022-12-14 ENCOUNTER — Other Ambulatory Visit: Payer: Self-pay

## 2022-12-14 DIAGNOSIS — M1711 Unilateral primary osteoarthritis, right knee: Secondary | ICD-10-CM | POA: Diagnosis not present

## 2022-12-14 MED ORDER — OXYCODONE HCL 5 MG PO TABS
5.0000 mg | ORAL_TABLET | Freq: Four times a day (QID) | ORAL | 0 refills | Status: DC | PRN
  Filled 2022-12-14: qty 30, 4d supply, fill #0

## 2022-12-14 MED ORDER — ASPIRIN 81 MG PO CHEW
81.0000 mg | CHEWABLE_TABLET | Freq: Two times a day (BID) | ORAL | 0 refills | Status: DC
  Filled 2022-12-14: qty 30, 15d supply, fill #0

## 2022-12-14 NOTE — Progress Notes (Signed)
Physical Therapy Treatment Patient Details Name: [name] MRN: [id number] DOB: May 11, 1965 Today's Date: 12/14/2022   History of Present Illness Patient is 57 y.o. female s/p Rt TKA on 12/12/22. PMH significant for anemia, anxiety, depression, OA, asthma, GERD, HTN, PTSD.    PT Comments  Patient resting in recliner and agreeable to therapy visit. Focus on HEP education for ROM, strength, and circulation. Educated on supine exercises. Pt's knee flexion limited by pain and AAROM required for flexion. Patient with overall poor activation of Rt quad and gentle assist required on initial SAQ with pt improving activation on each repetition. LAQ and SLR too challenging for pt to complete at this date. Ice provided at EOS and pt encouraged to participate in HEP on return home. Pt is mobilizing at safe level for return home with assist from family. Will continue to progress as able in acute setting.    If plan is discharge home, recommend the following: A little help with walking and/or transfers;A little help with bathing/dressing/bathroom;Assistance with cooking/housework;Help with stairs or ramp for entrance;Assist for transportation   Can travel by private vehicle        Equipment Recommendations  Rolling walker (2 wheels)    Recommendations for Other Services       Precautions / Restrictions Precautions Precautions: Fall Restrictions Weight Bearing Restrictions: No RLE Weight Bearing: Weight bearing as tolerated     Mobility  Bed Mobility Overal bed mobility: Needs Assistance Bed Mobility: Supine to Sit       Sit to supine: Supervision, HOB elevated   General bed mobility comments: pt OOB in recliner    Transfers Overall transfer level: Needs assistance Equipment used: Rolling walker (2 wheels) Transfers: Sit to/from Stand Sit to Stand: Supervision           General transfer comment: session focused on HEP form ROM in recliner     Ambulation/Gait Ambulation/Gait assistance: Contact guard assist, Supervision Gait Distance (Feet): 150 Feet Assistive device: Rolling walker (2 wheels) Gait Pattern/deviations: Step-to pattern, Decreased stance time - right, Decreased stride length, Decreased weight shift to right Gait velocity: decr     General Gait Details: pt maintained good position to RW throughout, no LOB noted. knee immobilizer in place.   Stairs Stairs: Yes Stairs assistance: Contact guard assist, Supervision Stair Management: One rail Left, Step to pattern, Sideways Number of Stairs: 2 General stair comments: cues for sequencing "up with good, down with bad' and for safe guarding for family to provide. no LOB noted.   Wheelchair Mobility     Tilt Bed    Modified Rankin (Stroke Patients Only)       Balance Overall balance assessment: Needs assistance Sitting-balance support: Feet supported Sitting balance-Leahy Scale: Fair     Standing balance support: During functional activity, Reliant on assistive device for balance, Bilateral upper extremity supported Standing balance-Leahy Scale: Poor                              Cognition Arousal: Alert Behavior During Therapy: WFL for tasks assessed/performed Overall Cognitive Status: Within Functional Limits for tasks assessed                                          Exercises Total Joint Exercises Ankle Circles/Pumps: AROM, Both, 20 reps Quad Sets: AROM, Right, 10 reps Short Arc Quad: AROM,  Right, 10 reps Heel Slides: AAROM, Right, 5 reps Hip ABduction/ADduction: AAROM, Right, 10 reps    General Comments        Pertinent Vitals/Pain Pain Assessment Pain Assessment: 0-10 Pain Score: 4  Pain Location: Rt knee with Rom exercises Pain Descriptors / Indicators: Aching, Discomfort, Guarding, Grimacing Pain Intervention(s): Limited activity within patient's tolerance, Monitored during session, Repositioned,  Ice applied    Home Living                          Prior Function            PT Goals (current goals can now be found in the care plan section) Acute Rehab PT Goals Patient Stated Goal: return home PT Goal Formulation: With patient Time For Goal Achievement: 12/26/22 Potential to Achieve Goals: Good Progress towards PT goals: Progressing toward goals    Frequency    7X/week      PT Plan      Co-evaluation              AM-PAC PT "6 Clicks" Mobility   Outcome Measure  Help needed turning from your back to your side while in a flat bed without using bedrails?: None Help needed moving from lying on your back to sitting on the side of a flat bed without using bedrails?: A Little Help needed moving to and from a bed to a chair (including a wheelchair)?: A Little Help needed standing up from a chair using your arms (e.g., wheelchair or bedside chair)?: A Little Help needed to walk in hospital room?: A Little Help needed climbing 3-5 steps with a railing? : A Little 6 Click Score: 19    End of Session Equipment Utilized During Treatment: Gait belt Activity Tolerance: Patient tolerated treatment well Patient left: in chair;with call bell/phone within reach;with chair alarm set Nurse Communication: Mobility status PT Visit Diagnosis: Muscle weakness (generalized) (M62.81);Difficulty in walking, not elsewhere classified (R26.2);Other abnormalities of gait and mobility (R26.89);Pain Pain - Right/Left: Right Pain - part of body: Knee     Time: 1056-1105 PT Time Calculation (min) (ACUTE ONLY): 9 min  Charges:    $Therapeutic Exercise: 8-22 mins PT General Charges $$ ACUTE PT VISIT: 1 Visit                     Wynn Maudlin, DPT Acute Rehabilitation Services Office [phone]  12/14/22 12:03 PM

## 2022-12-14 NOTE — TOC Transition Note (Addendum)
Transition of Care [facility]) - CM/SW Discharge Note   Patient Details  Name: [name] MRN: [id number] Date of Birth: 1965/12/07  [facility] CM/SW Contact:  Epifanio Lesches, RN Phone Number: 12/14/2022, 12:25 PM   Clinical Narrative:    Patient will DC to: home Anticipated DC date:12/14/2022 Family notified: yes Transport by: car  Per MD patient ready for DC today. RN, patient, and patient's husband notified of DC.NCM unable to secure home health services 2/2 payor source. Provider made aware , ok with outpatient services. Referral made with  [facility] @ pt's request.  Referral fax  RNCM will sign off for now as intervention is no longer needed. Please consult [place] again if new needs arise.      Final next level of care: Home w Home Health Services Barriers to Discharge: No Barriers Identified   Patient Goals and CMS Choice   Choice offered to / list presented to : Patient  Discharge Placement                         Discharge Plan and Services Additional resources added to the After Visit Summary for     Discharge Planning Services: CM Consult            DME Arranged: Bedside commode, Walker rolling DME Agency: [employer] Date DME Agency Contacted: 12/13/22 Time DME Agency Contacted: [phone] Representative spoke with at DME Agency: Ian Malkin HH Arranged: PT HH Agency: [facility], unable to accept   Time [facility] Agency Contacted: [phone] Representative spoke with at [facility] Agency: Kandee Keen  Social Determinants of Health (SDOH) Interventions SDOH Screenings   Food Insecurity: Food Insecurity Present (12/12/2022)  Housing: Patient Declined (12/12/2022)  Transportation Needs: No Transportation Needs (12/12/2022)  Utilities: Not At Risk (12/12/2022)  Alcohol Screen: Low Risk  (11/07/2022)  Depression (PHQ2-9): High Risk (11/07/2022)  Financial Resource Strain: High Risk (11/07/2022)  Social Connections:  Moderately Integrated (11/07/2022)  Tobacco Use: Medium Risk (12/12/2022)     Readmission Risk Interventions     No data to display

## 2022-12-14 NOTE — Discharge Summary (Signed)
Patient ID: [name] MRN: [id number] DOB/AGE: 1965-11-04 57 y.o.  Admit date: 12/12/2022 Discharge date: 12/14/2022  Admission Diagnoses:  Principal Problem:   Unilateral primary osteoarthritis, right knee Active Problems:   Status post total right knee replacement   Discharge Diagnoses:  Same  Past Medical History:  Diagnosis Date   Allergy    Anemia    Anxiety    Arthritis    Asthma    Depression    GERD (gastroesophageal reflux disease)    Heart murmur    from childhood- pt had ECHO 12/27/21   Hiatal hernia    Hypertension    Peptic ulcer    PTSD (post-traumatic stress disorder)    UTI (urinary tract infection)     Surgeries: Procedure(s): RIGHT TOTAL KNEE ARTHROPLASTY on 12/12/2022   Consultants:   Discharged Condition: Improved  Hospital Course: [name] is an 57 y.o. female who was admitted 12/12/2022 for operative treatment ofUnilateral primary osteoarthritis, right knee. Patient has severe unremitting pain that affects sleep, daily activities, and work/hobbies. After pre-op clearance the patient was taken to the operating room on 12/12/2022 and underwent  Procedure(s): RIGHT TOTAL KNEE ARTHROPLASTY.    Patient was given perioperative antibiotics:  Anti-infectives (From admission, onward)    Start     Dose/Rate Route Frequency Ordered Stop   12/12/22 2000  ceFAZolin (ANCEF) IVPB 1 g/50 mL premix        1 g 100 mL/hr over 30 Minutes Intravenous Every 6 hours 12/12/22 1544 12/13/22 0051   12/12/22 1000  ceFAZolin (ANCEF) IVPB 2g/100 mL premix        2 g 200 mL/hr over 30 Minutes Intravenous On call to O.R. 12/12/22 0948 12/12/22 1236        Patient was given sequential compression devices, early ambulation, and chemoprophylaxis to prevent DVT.  Patient benefited maximally from hospital stay and there were no complications.    Recent vital signs: Patient Vitals for the past 24 hrs:  BP Temp Temp src Pulse Resp SpO2  12/14/22 0841 (!)  95/59 98.7 F (37.1 C) Oral 98 18 100 %  12/14/22 0441 104/62 98 F (36.7 C) Oral 88 17 94 %  12/13/22 1920 114/62 98.1 F (36.7 C) Oral 89 19 97 %  12/13/22 1317 92/61 98.8 F (37.1 C) Oral 72 18 100 %     Recent laboratory studies:  Recent Labs    12/13/22 0732  WBC 6.4  HGB 10.4*  HCT 31.2*  PLT 132*  NA 135  K 3.4*  CL 102  CO2 27  BUN 8  CREATININE 0.88  GLUCOSE 134*  CALCIUM 8.4*     Discharge Medications:   Allergies as of 12/14/2022       Reactions   Lamictal [lamotrigine] Rash   Penicillins Other (See Comments)   Did it involve swelling of the face/tongue/throat, SOB, or low BP? Unk Did it involve sudden or severe rash/hives, skin peeling, or any reaction on the inside of your mouth or nose? Unk Did you need to seek medical attention at a hospital or doctor's office? Unk When did it last happen? Was 57 years old; reaction not recalled     If all above answers are "NO", may proceed with cephalosporin use.   Codeine Itching   Crazy dreams and itching-pt said she can take it with food        Medication List     TAKE these medications    acetaminophen 650 MG CR tablet Commonly known  as: TYLENOL Take 1,300 mg by mouth every 8 (eight) hours as needed (pain).   Align [employer] 2 tablets by mouth daily. Align pre and probiotic with vitamain C-take one tablet each Align pre and probiotic with Vitamin b12 for immune and energy-take one tablet each   ALLERGY MEDICINE PO Take 1 tablet by mouth daily as needed (Takes the Clortabs. Equate Brand for allergies).   aspirin 81 MG chewable tablet Chew 1 tablet (81 mg total) by mouth 2 (two) times daily.   B-D 3CC LUER-LOK SYR 25GX5/8" 25G X 5/8" 3 ML Misc Generic drug: SYRINGE-NEEDLE (DISP) 3 ML USE AS DIRECTED WITH B-12 INJECTION   busPIRone 15 MG tablet Commonly known as: BUSPAR Take 1 tablet by mouth 2 (two) times daily. Take daily at 9 AM and at 4 PM   colestipol 1 g tablet Commonly known as:  COLESTID Take 2 tablets (2 g total) by mouth 2 (two) times daily. Take 2 hours away from other medications.   cyanocobalamin 1000 MCG/ML injection Commonly known as: VITAMIN B12 Inject 1 mL (1,000 mcg total) into the muscle every 30 (thirty) days.   cyclobenzaprine 10 MG tablet Commonly known as: FLEXERIL Take 1 tablet (10 mg total) by mouth 3 (three) times daily as needed. for muscle spams   Diclofenac Sodium 2 % Soln Apply 1 pump twice daily as needed.   estradiol 0.1 MG/24HR patch Commonly known as: Dotti Place 1 patch (0.1 mg total) onto the skin 2 (two) times a week.   estradiol 0.1 MG/GM vaginal cream Commonly known as: ESTRACE Apply a pea-sized amount of cream to the fingertip and wipe in the front part of the vagina twice weekly   fluticasone 50 MCG/ACT nasal spray Commonly known as: FLONASE Place 2 sprays into both nostrils daily. What changed:  when to take this reasons to take this   furosemide 40 MG tablet Commonly known as: LASIX Take 1 tablet (40 mg total) by mouth daily.   hydrOXYzine 25 MG capsule Commonly known as: Vistaril Take 1 capsule (25 mg total) by mouth 2 (two) times daily as needed for anxiety. And sleep, racing thoughts (Take 1 capsule (25 mg total) by mouth 2 (two) times daily as needed for anxiety. And sleep, racing thoughts) What changed: when to take this   loperamide 2 MG capsule Commonly known as: IMODIUM Take 2 mg by mouth as needed for diarrhea or loose stools.   omeprazole 40 MG capsule Commonly known as: PRILOSEC Take 1 capsule (40 mg total) by mouth 2 (two) times daily. OPEN CAPSULE AND SPRINKLE ON APPLESAUCE.   OVER THE COUNTER MEDICATION Take 2 capsules by mouth daily. Mushroom Complex   oxyCODONE 5 MG immediate release tablet Commonly known as: Oxy IR/ROXICODONE Take 1-2 tablets (5-10 mg total) by mouth every 6 (six) hours as needed for moderate pain (pain score 4-6) (pain score 4-6).   QUEtiapine 50 MG tablet Commonly  known as: SEROquel Take 1 tablet (50 mg total) by mouth at bedtime.   rOPINIRole 1 MG tablet Commonly known as: REQUIP Take 1 tablet (1 mg total) by mouth at bedtime.   sertraline 100 MG tablet Commonly known as: Zoloft Take 1.5 tablets (150 mg total) by mouth daily for 15 days, THEN 2 tablets (200 mg total) daily for 15 days. Start taking on: November 01, 2022 What changed: See the new instructions.   SM Vitamin D3 50 MCG Caps Generic drug: Cholecalciferol Take 1 capsule (2,000 Units total) by mouth daily.  Durable Medical Equipment  (From admission, onward)           Start     Ordered   12/13/22 1538  DME 3 n 1  Once       Comments: Bedside commode, confine to one room   12/13/22 1537   12/12/22 1545  DME Walker rolling  Once       Question Answer Comment  Walker: With 5 Inch Wheels   Patient needs a walker to treat with the following condition Status post total right knee replacement      12/12/22 1544            Diagnostic Studies: DG Knee Right Port  Result Date: 12/12/2022 CLINICAL DATA:  Status post right knee arthroplasty. EXAM: PORTABLE RIGHT KNEE - 1-2 VIEW COMPARISON:  Right knee radiographs 08/18/2022 FINDINGS: Interval total right knee arthroplasty. No perihardware lucency is seen to indicate hardware failure or loosening. Expected postoperative changes including intra-articular and subcutaneous air. Small joint effusion. Anterior surgical skin staples. No acute fracture or dislocation. IMPRESSION: Interval total right knee arthroplasty without evidence of hardware failure. Electronically Signed   By: Neita Garnet M.D.   On: 12/12/2022 15:21    Disposition: Discharge disposition: 01-Home or Self Care          Follow-up Information     Kathryne Hitch, MD Follow up in 2 week(s).   Specialty: Orthopedic Surgery Contact information: [address] [place] [address] [phone]         Zola Button, Myrene Buddy R,  DO Follow up.   Specialty: Family Medicine Contact information: [address] [place] [address] [phone]                  Signed: Kathryne Hitch 12/14/2022, 11:06 AM

## 2022-12-14 NOTE — Progress Notes (Signed)
Physical Therapy Treatment Patient Details Name: [name] MRN: [id number] DOB: [birth year] Today's Date: 12/14/2022   History of Present Illness Patient is 57 y.o. female s/p Rt TKA on 12/12/22. PMH significant for anemia, anxiety, depression, OA, asthma, GERD, HTN, PTSD.    PT Comments  Pt resting in bed at start of session and agreeable/eager to work with therapy, reports rested well last night. Cues provided to use belt to assist Rt LE off EOB and pt completed supine>sit with Supervision only. Good carryover noted for sit<>stand transfers from EOB with RW, sup for safety only. Pt amb ~150' with CGA fading to supervision and maintained safe position to RW with no LOB noted. Initiated stair mobility this visit and pt able to transition from RW to hand rail and ascend/descend 2 stairs with side step-to-pattern and single rail on Lt;  no LOB, and pt verbalized safe guarding position for family to provide. EOS pt agreeable to rest in recliner. Alarm on and call bell within reach. Will continue to progress with mobility as able.     If plan is discharge home, recommend the following: A little help with walking and/or transfers;A little help with bathing/dressing/bathroom;Assistance with cooking/housework;Help with stairs or ramp for entrance;Assist for transportation   Can travel by private vehicle        Equipment Recommendations  Rolling walker (2 wheels)    Recommendations for Other Services       Precautions / Restrictions Precautions Precautions: Fall Restrictions Weight Bearing Restrictions: No RLE Weight Bearing: Weight bearing as tolerated     Mobility  Bed Mobility Overal bed mobility: Needs Assistance Bed Mobility: Supine to Sit       Sit to supine: Supervision, HOB elevated   General bed mobility comments: HOB slightly elevated, pt using belt for Rt LE off EOB, min cues to sequence.    Transfers Overall transfer level: Needs assistance Equipment used: Rolling  walker (2 wheels) Transfers: Sit to/from Stand Sit to Stand: Supervision           General transfer comment: pt using bil UE on RW to power up, sup for safety and pt steady with rise.    Ambulation/Gait Ambulation/Gait assistance: Contact guard assist, Supervision Gait Distance (Feet): 150 Feet Assistive device: Rolling walker (2 wheels) Gait Pattern/deviations: Step-to pattern, Decreased stance time - right, Decreased stride length, Decreased weight shift to right Gait velocity: decr     General Gait Details: pt maintained good position to RW throughout, no LOB noted. knee immobilizer in place.   Stairs Stairs: Yes Stairs assistance: Contact guard assist, Supervision Stair Management: One rail Left, Step to pattern, Sideways Number of Stairs: 2 General stair comments: cues for sequencing "up with good, down with bad' and for safe guarding for family to provide. no LOB noted.   Wheelchair Mobility     Tilt Bed    Modified Rankin (Stroke Patients Only)       Balance Overall balance assessment: Needs assistance Sitting-balance support: Feet supported Sitting balance-Leahy Scale: Fair     Standing balance support: During functional activity, Reliant on assistive device for balance, Bilateral upper extremity supported Standing balance-Leahy Scale: Poor                              Cognition Arousal: Alert Behavior During Therapy: WFL for tasks assessed/performed Overall Cognitive Status: Within Functional Limits for tasks assessed  Exercises      General Comments        Pertinent Vitals/Pain Pain Assessment Pain Assessment: 0-10 Pain Score: 4  Pain Location: Rt knee with Rom exercises Pain Descriptors / Indicators: Aching, Discomfort, Guarding, Grimacing Pain Intervention(s): Limited activity within patient's tolerance, Monitored during session, Premedicated before session,  Repositioned    Home Living                          Prior Function            PT Goals (current goals can now be found in the care plan section) Acute Rehab PT Goals Patient Stated Goal: return home PT Goal Formulation: With patient Time For Goal Achievement: 12/26/22 Potential to Achieve Goals: Good Progress towards PT goals: Progressing toward goals    Frequency    7X/week      PT Plan      Co-evaluation              AM-PAC PT "6 Clicks" Mobility   Outcome Measure  Help needed turning from your back to your side while in a flat bed without using bedrails?: None Help needed moving from lying on your back to sitting on the side of a flat bed without using bedrails?: A Little Help needed moving to and from a bed to a chair (including a wheelchair)?: A Little Help needed standing up from a chair using your arms (e.g., wheelchair or bedside chair)?: A Little Help needed to walk in hospital room?: A Little Help needed climbing 3-5 steps with a railing? : A Little 6 Click Score: 19    End of Session Equipment Utilized During Treatment: Gait belt Activity Tolerance: Patient tolerated treatment well Patient left: in chair;with call bell/phone within reach;with chair alarm set;with family/visitor present Nurse Communication: Mobility status PT Visit Diagnosis: Muscle weakness (generalized) (M62.81);Difficulty in walking, not elsewhere classified (R26.2);Other abnormalities of gait and mobility (R26.89);Pain Pain - Right/Left: Right Pain - part of body: Knee     Time: 1610-9604 PT Time Calculation (min) (ACUTE ONLY): 29 min  Charges:    $Gait Training: 23-37 mins PT General Charges $$ ACUTE PT VISIT: 1 Visit                     Wynn Maudlin, DPT Acute Rehabilitation Services Office [phone]  12/14/22 10:14 AM

## 2022-12-14 NOTE — Progress Notes (Signed)
Subjective: 2 Days Post-Op Procedure(s) (LRB): RIGHT TOTAL KNEE ARTHROPLASTY (Right) Patient reports pain as moderate.   Progressing well with PT.  Objective: Vital signs in last 24 hours: Temp:  [98 F (36.7 C)-98.8 F (37.1 C)] 98.7 F (37.1 C) (11/21 0841) Pulse Rate:  [72-98] 98 (11/21 0841) Resp:  [17-19] 18 (11/21 0841) BP: (92-114)/(59-62) 95/59 (11/21 0841) SpO2:  [94 %-100 %] 100 % (11/21 0841)  Intake/Output from previous day: No intake/output data recorded. Intake/Output this shift: No intake/output data recorded.  Recent Labs    12/13/22 0732  HGB 10.4*   Recent Labs    12/13/22 0732  WBC 6.4  RBC 3.43*  HCT 31.2*  PLT 132*   Recent Labs    12/13/22 0732  NA 135  K 3.4*  CL 102  CO2 27  BUN 8  CREATININE 0.88  GLUCOSE 134*  CALCIUM 8.4*   No results for input(s): "LABPT", "INR" in the last 72 hours.  Incision: scant drainage Dorsi/plantar flexion intact calf supple and non tender  Assessment/Plan: 2 Days Post-Op Procedure(s) (LRB): RIGHT TOTAL KNEE ARTHROPLASTY (Right) Up with therapy Discharge to home today if patient remains stable and does well with Therapy      [name] 12/14/2022, 10:45 AM

## 2022-12-14 NOTE — TOC Transition Note (Signed)
Transition of Care [facility]) - CM/SW Discharge Note   Patient Details  Name: [name] MRN: [id number] Date of Birth: 06-20-1965  [facility]) CM/SW Contact:  Epifanio Lesches, RN Phone Number: 12/14/2022, 8:03 AM   Clinical Narrative:    Patient will DC to: home Anticipated DC date: 12/14/2022 Family notified: yes Transport by: car     - s/p Rt TKA on 12/12/22  Per MD patient ready for DC today pending therapy clearance. RN, patient, and patient's husband aware of DC plan. Husband to assist with care once d/c. Referral made with [facility]/ [facility] for HHPT services, acceptance pending. Referral made with [employer] [phone]) for DME. Orders and DME narrative from provider needs to be signed, provider aware. Once done equipment will be delivered to bedside. Pt without RX med concerns or transportation issues. Post hospital f/u noted on AVS.  RNCM will sign off for now as intervention is no longer needed. Please consult [place] again if new needs arise.   Final next level of care: Home w Home Health Services Barriers to Discharge: No Barriers Identified   Patient Goals and CMS Choice   Choice offered to / list presented to : Patient  Discharge Placement                         Discharge Plan and Services Additional resources added to the After Visit Summary for     Discharge Planning Services: CM Consult            DME Arranged: Bedside commode, Walker rolling DME Agency: [employer] Date DME Agency Contacted: 12/13/22 Time DME Agency Contacted: [phone] Representative spoke with at DME Agency: Ian Malkin HH Arranged: PT HH Agency: [facility]   Time [facility] Agency Contacted: [phone] Representative spoke with at [facility] Agency: Kandee Keen  Social Determinants of Health (SDOH) Interventions SDOH Screenings   Food Insecurity: Food Insecurity Present (12/12/2022)  Housing: Patient Declined (12/12/2022)  Transportation Needs: No Transportation Needs  (12/12/2022)  Utilities: Not At Risk (12/12/2022)  Alcohol Screen: Low Risk  (11/07/2022)  Depression (PHQ2-9): High Risk (11/07/2022)  Financial Resource Strain: High Risk (11/07/2022)  Social Connections: Moderately Integrated (11/07/2022)  Tobacco Use: Medium Risk (12/12/2022)     Readmission Risk Interventions     No data to display

## 2022-12-14 NOTE — Anesthesia Postprocedure Evaluation (Signed)
Anesthesia Post Note  Patient: [name]  Procedure(s) Performed: RIGHT TOTAL KNEE ARTHROPLASTY (Right: Knee)     Patient location during evaluation: PACU Anesthesia Type: Spinal Level of consciousness: awake and alert Pain management: pain level controlled Vital Signs Assessment: post-procedure vital signs reviewed and stable Respiratory status: spontaneous breathing and respiratory function stable Cardiovascular status: blood pressure returned to baseline and stable Postop Assessment: spinal receding Anesthetic complications: no   No notable events documented.  Last Vitals:  Vitals:   12/13/22 1317 12/13/22 1920  BP: 92/61 114/62  Pulse: 72 89  Resp: 18 19  Temp: 37.1 C 36.7 C  SpO2: 100% 97%    Last Pain:  Vitals:   12/13/22 2201  TempSrc:   PainSc: Asleep   Pain Goal: Patients Stated Pain Goal: 0 (12/12/22 1126)                 Kennieth Rad

## 2022-12-15 ENCOUNTER — Other Ambulatory Visit: Payer: Self-pay | Admitting: Psychiatry

## 2022-12-15 ENCOUNTER — Other Ambulatory Visit: Payer: Self-pay | Admitting: Family Medicine

## 2022-12-15 ENCOUNTER — Other Ambulatory Visit (HOSPITAL_COMMUNITY): Payer: Self-pay

## 2022-12-15 DIAGNOSIS — F4010 Social phobia, unspecified: Secondary | ICD-10-CM

## 2022-12-15 DIAGNOSIS — F3341 Major depressive disorder, recurrent, in partial remission: Secondary | ICD-10-CM

## 2022-12-15 DIAGNOSIS — F4310 Post-traumatic stress disorder, unspecified: Secondary | ICD-10-CM

## 2022-12-15 MED ORDER — ESTRADIOL 0.1 MG/24HR TD PTTW
1.0000 | MEDICATED_PATCH | TRANSDERMAL | 2 refills | Status: DC
  Filled 2022-12-15 (×2): qty 8, 28d supply, fill #0
  Filled 2023-01-18: qty 8, 28d supply, fill #1
  Filled 2023-02-19: qty 8, 28d supply, fill #2

## 2022-12-15 MED ORDER — SERTRALINE HCL 100 MG PO TABS
200.0000 mg | ORAL_TABLET | Freq: Every day | ORAL | 1 refills | Status: DC
  Filled 2022-12-15 – 2022-12-17 (×2): qty 180, 90d supply, fill #0
  Filled 2023-03-22: qty 180, 90d supply, fill #1

## 2022-12-18 ENCOUNTER — Other Ambulatory Visit: Payer: Self-pay

## 2022-12-18 ENCOUNTER — Encounter: Payer: Self-pay | Admitting: Family Medicine

## 2022-12-18 ENCOUNTER — Ambulatory Visit (INDEPENDENT_AMBULATORY_CARE_PROVIDER_SITE_OTHER): Payer: Medicaid Other | Admitting: Family Medicine

## 2022-12-18 VITALS — BP 108/80 | HR 84 | Temp 99.2°F | Resp 18 | Ht 62.0 in

## 2022-12-18 DIAGNOSIS — D509 Iron deficiency anemia, unspecified: Secondary | ICD-10-CM | POA: Diagnosis not present

## 2022-12-18 DIAGNOSIS — Z23 Encounter for immunization: Secondary | ICD-10-CM | POA: Diagnosis not present

## 2022-12-18 LAB — CBC WITH DIFFERENTIAL/PLATELET
Basophils Absolute: 0 10*3/uL (ref 0.0–0.1)
Basophils Relative: 0.5 % (ref 0.0–3.0)
Eosinophils Absolute: 0.2 10*3/uL (ref 0.0–0.7)
Eosinophils Relative: 3.2 % (ref 0.0–5.0)
HCT: 30.6 % — ABNORMAL LOW (ref 36.0–46.0)
Hemoglobin: 10.5 g/dL — ABNORMAL LOW (ref 12.0–15.0)
Lymphocytes Relative: 19.5 % (ref 12.0–46.0)
Lymphs Abs: 1.2 10*3/uL (ref 0.7–4.0)
MCHC: 34.2 g/dL (ref 30.0–36.0)
MCV: 92.4 fL (ref 78.0–100.0)
Monocytes Absolute: 0.5 10*3/uL (ref 0.1–1.0)
Monocytes Relative: 7.5 % (ref 3.0–12.0)
Neutro Abs: 4.2 10*3/uL (ref 1.4–7.7)
Neutrophils Relative %: 69.3 % (ref 43.0–77.0)
Platelets: 298 10*3/uL (ref 150.0–400.0)
RBC: 3.31 Mil/uL — ABNORMAL LOW (ref 3.87–5.11)
RDW: 12.3 % (ref 11.5–15.5)
WBC: 6.1 10*3/uL (ref 4.0–10.5)

## 2022-12-18 NOTE — Progress Notes (Signed)
Established Patient Office Visit  Subjective   Patient ID: [name], female    DOB: 1965-07-17  Age: 57 y.o. MRN: [id number]  Chief Complaint  Patient presents with   Hospitalization Follow-up    Right Knee replacement. Pt is up and moving    HPI Discussed the use of AI scribe software for clinical note transcription with the patient, who gave verbal consent to proceed.  History of Present Illness   The patient, with a recent history of knee surgery, presents for a follow-up visit. She reports that the surgery went well, but she is experiencing significant post-operative pain, which is sometimes intolerable. The patient is currently taking oxycodone for pain management. She has been adhering to the prescribed physical therapy exercises at home three times a day and is scheduled to start formal physical therapy sessions.  The patient reports significant swelling in the knee, which she notes increases in size by the evening. She has been using an ice machine to manage the swelling, but is considering increasing the icing duration to 45 minutes. She was informed that some swelling is normal post-surgery.  The patient also reports issues with the compression stockings provided by the hospital, which roll down and cause discomfort. She is considering seeking a better-fitting pair. She also expresses a wish for a bathroom chair, as she is currently unable to take a bath due to the surgery.  The patient also mentions that her father has been diagnosed with early signs of Alzheimer's dementia based on a new blood panel test. She expresses concern about this and the impact it may have on her family.      Patient Active Problem List   Diagnosis Date Noted   Status post total right knee replacement 12/12/2022   Cervical stenosis of spine 08/04/2022   At risk for prolonged QT interval syndrome 10/19/2021   PTSD (post-traumatic stress disorder) 04/04/2021   MDD (major depressive disorder),  recurrent episode, moderate (HCC) 04/04/2021   Social anxiety disorder 04/04/2021   Dizziness 11/04/2020   Epistaxis 11/04/2020   Chest pain of uncertain etiology 11/04/2020   Anxiety 09/23/2020   Attention and concentration deficit 09/23/2020   Degenerative spondylolisthesis 04/05/2020   Low back pain with radiation 02/06/2020   Bilateral lower extremity edema 04/07/2019   Pain in both lower extremities 04/07/2019   Numbness in both hands 04/07/2019   Pain in right arm 04/07/2019   Recurrent urinary tract infection 10/24/2018   Menopausal syndrome 10/24/2018   Abnormal mammogram 10/24/2018   Abdominal pain, epigastric    Marginal ulcers    Melena 07/10/2018   Alcohol use disorder, severe, in sustained remission (HCC) 07/10/2018   Encounter for screening for HIV 07/10/2018   Asthma 07/10/2018   Exposure to COVID-19 virus 05/28/2018   GERD (gastroesophageal reflux disease) 05/28/2018   Free intraperitoneal air 03/08/2018   Perforated ulcer (HCC) 03/08/2018   Cervical radiculopathy at C7 02/22/2018   Essential hypertension 08/02/2017   Hematoma of hip, left, initial encounter 06/28/2017   Neck pain 06/28/2017   Nonallopathic lesion of cervical region 06/28/2017   Nonallopathic lesion of thoracic region 06/28/2017   Nonallopathic lesion of lumbar region 06/28/2017   Biomechanical lesion, unspecified 06/28/2017   Muscle spasm 04/20/2017   URI (upper respiratory infection) 10/02/2016   Preventative health care 05/01/2016   Unilateral primary osteoarthritis, right knee 06/02/2015   Patellofemoral arthritis of left knee 05/04/2015   Cough 09/02/2013   Acute pharyngitis 09/02/2013   Sinusitis 06/25/2013   Dog  bite of face 04/16/2013   Gastroenteritis 04/16/2013   Allergic rhinitis 04/16/2013   Mass of soft tissue 08/02/2012   RLS (restless legs syndrome) 01/21/2009   LYMPHOPENIA 03/23/2008   Depression 02/27/2008   HEMANGIOMA, SKIN 02/04/2008   Lap Roux Y GB October 2007 for  BMI 46 02/19/2007   Past Medical History:  Diagnosis Date   Allergy    Anemia    Anxiety    Arthritis    Asthma    Depression    GERD (gastroesophageal reflux disease)    Heart murmur    from childhood- pt had ECHO 12/27/21   Hiatal hernia    Hypertension    Peptic ulcer    PTSD (post-traumatic stress disorder)    UTI (urinary tract infection)    Past Surgical History:  Procedure Laterality Date   ABDOMINAL HYSTERECTOMY     fibroids   BIOPSY  07/11/2018   Procedure: BIOPSY;  Surgeon: Meryl Dare, MD;  Location: [facility] ENDOSCOPY;  Service: Endoscopy;;   COLONOSCOPY  2014   ESOPHAGOGASTRODUODENOSCOPY (EGD) WITH PROPOFOL N/A 07/11/2018   Procedure: ESOPHAGOGASTRODUODENOSCOPY (EGD) WITH PROPOFOL;  Surgeon: Meryl Dare, MD;  Location: [facility] ENDOSCOPY;  Service: Endoscopy;  Laterality: N/A;   GASTRIC BYPASS  2007   HEMOSTASIS CLIP PLACEMENT  07/11/2018   Procedure: HEMOSTASIS CLIP PLACEMENT;  Surgeon: Meryl Dare, MD;  Location: [facility] ENDOSCOPY;  Service: Endoscopy;;   LAPAROTOMY N/A 03/08/2018   Procedure: EXPLORATORY LAPAROTOMY, REPAIR AND PATCH CLOSURE OF PERFORATED ULCER;  Surgeon: Violeta Gelinas, MD;  Location[facility] OR;  Service: General;  Laterality: N/A;   TONSILLECTOMY     and adenoids removed as a child   TOTAL KNEE ARTHROPLASTY Right 12/12/2022   Procedure: RIGHT TOTAL KNEE ARTHROPLASTY;  Surgeon: Kathryne Hitch, MD;  Location: [facility] OR;  Service: Orthopedics;  Laterality: Right;   TUBAL LIGATION     Social History   Tobacco Use   Smoking status: Former    Current packs/day: 0.00    Types: Cigarettes    Quit date: 01/24/1987    Years since quitting: 35.9   Smokeless tobacco: Never  Vaping Use   Vaping status: Former   Substances: Flavoring   Devices: has vaped with flavor but no nicotine. Been 8 years  Substance Use Topics   Alcohol use: Not Currently    Comment: pt has not drank alcohol since 02/2020   Drug use: No   Social History   Socioeconomic  History   Marital status: Married    Spouse name: mark   Number of children: 0   Years of education: Not on file   Highest education level: High school graduate  Occupational History    Employer: [employer]    Comment: [employer]  Tobacco Use   Smoking status: Former    Current packs/day: 0.00    Types: Cigarettes    Quit date: 01/24/1987    Years since quitting: 35.9   Smokeless tobacco: Never  Vaping Use   Vaping status: Former   Substances: Flavoring   Devices: has vaped with flavor but no nicotine. Been 8 years  Substance and Sexual Activity   Alcohol use: Not Currently    Comment: pt has not drank alcohol since 02/2020   Drug use: No   Sexual activity: Not Currently  Other Topics Concern   Not on file  Social History Narrative   Regular exercise--no (because of knees)   Social Determinants of Health   Financial Resource Strain: High Risk (11/07/2022)  Overall Financial Resource Strain (CARDIA)    Difficulty of Paying Living Expenses: Very hard  Food Insecurity: Food Insecurity Present (12/12/2022)   Hunger Vital Sign    Worried About Running Out of Food in the Last Year: Sometimes true    Ran Out of Food in the Last Year: Sometimes true  Transportation Needs: No Transportation Needs (12/12/2022)   PRAPARE - Administrator, Civil Service (Medical): No    Lack of Transportation (Non-Medical): No  Physical Activity: Not on file  Stress: Not on file  Social Connections: Moderately Integrated (11/07/2022)   Social Connection and Isolation Panel [NHANES]    Frequency of Communication with Friends and Family: More than three times a week    Frequency of Social Gatherings with Friends and Family: More than three times a week    Attends Religious Services: More than 4 times per year    Active Member of [employer] or Organizations: No    Attends Banker Meetings: Never    Marital Status: Married  Catering manager Violence: Not At Risk (12/12/2022)    Humiliation, Afraid, Rape, and Kick questionnaire    Fear of Current or Ex-Partner: No    Emotionally Abused: No    Physically Abused: No    Sexually Abused: No   Family Status  Relation Name Status   Mother  Deceased   Father  Alive   Sister  Alive   Brother  Alive   MGF  (Not Specified)   MGM  (Not Specified)   PGF  (Not Specified)   PGM  (Not Specified)   Neg Hx  (Not Specified)  No partnership data on file   Family History  Problem Relation Age of Onset   Stroke Mother    Alzheimer's disease Mother    Hypertension Father    Cancer Father        Prostate   Alcohol abuse Sister    Depression Sister    Alcohol abuse Brother    Depression Brother    Diabetes Maternal Grandfather    Liver disease Maternal Grandfather    Cancer Maternal Grandfather        Prostate   Diabetes Maternal Grandmother    Alzheimer's disease Maternal Grandmother    Diabetes Paternal Grandfather    Cancer Paternal Grandfather        Lung, Prostate   Diabetes Paternal Grandmother    Alzheimer's disease Paternal Grandmother    Colon cancer Neg Hx    Esophageal cancer Neg Hx    Pancreatic cancer Neg Hx    Stomach cancer Neg Hx    Colon polyps Neg Hx    Rectal cancer Neg Hx       Review of Systems  Constitutional:  Negative for fever and malaise/fatigue.  HENT:  Negative for congestion.   Eyes:  Negative for blurred vision.  Respiratory:  Negative for shortness of breath.   Cardiovascular:  Negative for chest pain, palpitations and leg swelling.  Gastrointestinal:  Negative for abdominal pain, blood in stool and nausea.  Genitourinary:  Negative for dysuria and frequency.  Musculoskeletal:  Negative for falls.  Skin:  Negative for rash.  Neurological:  Negative for dizziness, loss of consciousness and headaches.  Endo/Heme/Allergies:  Negative for environmental allergies.  Psychiatric/Behavioral:  Negative for depression. The patient is not nervous/anxious.       Objective:      BP 108/80 (BP Location: Right Arm, Patient Position: Sitting, Cuff Size: Normal)   Pulse 84  Temp 99.2 F (37.3 C) (Oral)   Resp 18   Ht 5\' 2"  (1.575 m)   SpO2 96%   BMI 28.72 kg/m  BP Readings from Last 3 Encounters:  12/18/22 108/80  12/14/22 (!) 95/59  12/05/22 98/71   Wt Readings from Last 3 Encounters:  12/12/22 157 lb (71.2 kg)  12/05/22 161 lb 12.8 oz (73.4 kg)  10/05/22 161 lb (73 kg)   SpO2 Readings from Last 3 Encounters:  12/18/22 96%  12/14/22 100%  12/05/22 100%      Physical Exam Vitals and nursing note reviewed.  Constitutional:      General: She is not in acute distress.    Appearance: Normal appearance. She is well-developed.  HENT:     Head: Normocephalic and atraumatic.  Eyes:     General: No scleral icterus.       Right eye: No discharge.        Left eye: No discharge.  Cardiovascular:     Rate and Rhythm: Normal rate and regular rhythm.     Heart sounds: No murmur heard. Pulmonary:     Effort: Pulmonary effort is normal. No respiratory distress.     Breath sounds: Normal breath sounds.  Musculoskeletal:        General: Swelling and tenderness present. Normal range of motion.     Cervical back: Normal range of motion and neck supple.     Right lower leg: No edema.     Left lower leg: No edema.  Skin:    General: Skin is warm and dry.  Neurological:     Mental Status: She is alert and oriented to person, place, and time.  Psychiatric:        Mood and Affect: Mood normal.        Behavior: Behavior normal.        Thought Content: Thought content normal.        Judgment: Judgment normal.      Results for orders placed or performed in visit on 12/18/22  CBC with Differential/Platelet  Result Value Ref Range   WBC 6.1 4.0 - 10.5 K/uL   RBC 3.31 (L) 3.87 - 5.11 Mil/uL   Hemoglobin 10.5 (L) 12.0 - 15.0 g/dL   HCT 16.1 (L) 09.6 - 04.5 %   MCV 92.4 78.0 - 100.0 fl   MCHC 34.2 30.0 - 36.0 g/dL   RDW 40.9 81.1 - 91.4 %   Platelets  298.0 150.0 - 400.0 K/uL   Neutrophils Relative % 69.3 43.0 - 77.0 %   Lymphocytes Relative 19.5 12.0 - 46.0 %   Monocytes Relative 7.5 3.0 - 12.0 %   Eosinophils Relative 3.2 0.0 - 5.0 %   Basophils Relative 0.5 0.0 - 3.0 %   Neutro Abs 4.2 1.4 - 7.7 K/uL   Lymphs Abs 1.2 0.7 - 4.0 K/uL   Monocytes Absolute 0.5 0.1 - 1.0 K/uL   Eosinophils Absolute 0.2 0.0 - 0.7 K/uL   Basophils Absolute 0.0 0.0 - 0.1 K/uL    Last CBC Lab Results  Component Value Date   WBC 6.1 12/18/2022   HGB 10.5 (L) 12/18/2022   HCT 30.6 (L) 12/18/2022   MCV 92.4 12/18/2022   MCH 30.3 12/13/2022   RDW 12.3 12/18/2022   PLT 298.0 12/18/2022   Last metabolic panel Lab Results  Component Value Date   GLUCOSE 134 (H) 12/13/2022   NA 135 12/13/2022   K 3.4 (L) 12/13/2022   CL 102 12/13/2022   CO2  27 12/13/2022   BUN 8 12/13/2022   CREATININE 0.88 12/13/2022   GFRNONAA >60 12/13/2022   CALCIUM 8.4 (L) 12/13/2022   PHOS 3.8 01/29/2009   PROT 6.1 (L) 12/05/2022   ALBUMIN 3.5 12/05/2022   LABGLOB 2.1 08/27/2018   AGRATIO 1.9 08/27/2018   BILITOT 0.5 12/05/2022   ALKPHOS 72 12/05/2022   AST 34 12/05/2022   ALT 34 12/05/2022   ANIONGAP 6 12/13/2022   Last lipids Lab Results  Component Value Date   CHOL 174 04/07/2022   HDL 66 04/07/2022   LDLCALC 92 04/07/2022   TRIG 69 04/07/2022   CHOLHDL 2.6 04/07/2022   Last hemoglobin A1c Lab Results  Component Value Date   HGBA1C 5.3 05/24/2022   Last thyroid functions Lab Results  Component Value Date   TSH 3.15 08/04/2022   Last vitamin D Lab Results  Component Value Date   VD25OH 52.80 11/14/2021      The 10-year ASCVD risk score (Arnett DK, et al., 2019) is: 1.8%    Assessment & Plan:   Problem List Items Addressed This Visit   None Visit Diagnoses     Iron deficiency anemia, unspecified iron deficiency anemia type    -  Primary   Relevant Orders   CBC with Differential/Platelet (Completed)   Flu vaccine trivalent PF, 6mos and  older(Flulaval,Afluria,Fluarix,Fluzone) (Completed)     Assessment and Plan    Postoperative Pain Management   Following right knee replacement surgery, she is experiencing postoperative pain, currently managed with oxycodone, and prefers to continue this regimen. We will continue oxycodone as prescribed, start physical therapy tomorrow, and use the ice machine as directed.  Postoperative Swelling   There is significant swelling in her right knee post-surgery. She plans to increase icing duration to 45 minutes. We will continue icing the knee as directed.  Compression Stockings Issues   Her compression stockings are rolling down and causing discomfort, likely due to improper sizing. She inquired about alternatives and proper sizing. We will provide a prescription for properly sized compression stockings.  Postoperative Anemia   She reports low energy post-surgery, with possible anemia indicated by low hemoglobin levels. Hemoglobin will be rechecked today in the lab.  General Health Maintenance   She has not received a flu shot this season and prefers to receive it today. We will administer the flu shot today.  Follow-up   We will follow up with the surgeon on December 2nd.        No follow-ups on file.    Donato Schultz, DO

## 2022-12-19 ENCOUNTER — Ambulatory Visit (INDEPENDENT_AMBULATORY_CARE_PROVIDER_SITE_OTHER): Payer: Medicaid Other | Admitting: Licensed Clinical Social Worker

## 2022-12-19 DIAGNOSIS — F4310 Post-traumatic stress disorder, unspecified: Secondary | ICD-10-CM

## 2022-12-19 DIAGNOSIS — F4010 Social phobia, unspecified: Secondary | ICD-10-CM | POA: Diagnosis not present

## 2022-12-19 DIAGNOSIS — F331 Major depressive disorder, recurrent, moderate: Secondary | ICD-10-CM | POA: Diagnosis not present

## 2022-12-19 NOTE — Progress Notes (Addendum)
THERAPIST PROGRESS NOTE  Virtual Visit via Video Note  I connected with [name] on 12/19/22 at 11:00 AM EST by a video enabled telemedicine application and verified that I am speaking with the correct person using two identifiers.  Location: Patient: Address on file  Provider: Office    I discussed the limitations of evaluation and management by telemedicine and the availability of in person appointments. The patient expressed understanding and agreed to proceed.     I discussed the assessment and treatment plan with the patient. The patient was provided an opportunity to ask questions and all were answered. The patient agreed with the plan and demonstrated an understanding of the instructions.   The patient was advised to call back or seek an in-person evaluation if the symptoms worsen or if the condition fails to improve as anticipated.  I provided 32 minutes of non-face-to-face time during this encounter.   Dereck Leep, LCSW   Session Time: 11:02AM-11:35AM  Participation Level: Active  Behavioral Response: CasualDrowsyEuthymic  Type of Therapy: Individual Therapy  Treatment Goals addressed: LTG: Reduction in intrusive event recollections, avoidance of event reminders, intense arousal, or disinterest in activities or relationships as evidenced by pt self report 3 out of 5 sessions documented.    STG: Zyan will identify internal and external stimuli that trigger PTSD symptoms  as evidenced by pt self report 3 out of 5 sessions documented.    ProgressTowards Goals: Progressing  Interventions: Strength-based and Supportive, Reframing, CBT  Summary: [name] is a 57 y.o. female who presents with symptoms of anxiety and depression.  Patient reports symptoms of uncontrollable worry, anxious feelings, tearfulness, low mood.  Patient presented as drowsy due to pain medication for recent knee surgery.  Was engaged and cooperative.  Patient denies HI/AVH.   Patient reports passive suicidal ideations but denies intent and plan.  Patient utilized therapeutic space to process recent knee surgery.  Patient reports surgery went well and she is proud that she is up and walking.  Processed feelings about first physical therapy session later today and how patient is coping with pain/discomfort related to surgery.  Patient reflected on recent anxious episode due to overwhelmed discomfort related to lack of mobility.  Patient identified husband has been supportive as well as other relatives.  Patient reflected on history of anxiety with physical therapist due to physical touch, which triggered symptoms of trauma.  Patient was able to acknowledge a difference with this round of physical therapy citing the location of the surgery as a main factor.  She identified depressive symptoms have worsened due to sedentary schedule as a result of knee surgery and lack of mobility.  Patient became tearful processing recent diagnosis of dementia markers and her father's blood work.  Patient identified she plans to be tested herself due to feelings of control.  Explored ways patient feels more prepared due to a history of caring for her mother who had Alzheimer's.    Reflected on patient's upcoming holiday plans.  Patient identified boundaries she has already established with relatives are related to upcoming holiday gatherings.  Processed symptoms of social anxiety.  Reviewed coping skills with patient.  Suicidal/Homicidal: Yeswithout intent/plan  Therapist Response: Clinician utilized active and supportive reflection to create a safe environment for patient to process recent life stressors and symptoms.  Clinician worked with patient to review coping skills.  Identified ways patient can set boundaries regarding upcoming holiday gatherings.  Processed and reframed anxious thoughts regarding recent surgery.  Processed  and reframed unhelpful thoughts regarding recent diagnosis  of relative.  Plan: Return again in 1 week.  Diagnosis: PTSD (post-traumatic stress disorder)  Social anxiety disorder  MDD (major depressive disorder), recurrent episode, moderate (HCC)    Collaboration of Care: AEB psychiatrist can access notes and cln. Will review psychiatrists' notes. Check in with the patient and will see LCSW per availability. Patient agreed with treatment recommendations.  Patient/Guardian was advised Release of Information must be obtained prior to any record release in order to collaborate their care with an outside provider. Patient/Guardian was advised if they have not already done so to contact the registration department to sign all necessary forms in order for [place] to release information regarding their care.   Consent: Patient/Guardian gives verbal consent for treatment and assignment of benefits for services provided during this visit. Patient/Guardian expressed understanding and agreed to proceed.   Dereck Leep, LCSW 12/19/2022

## 2022-12-23 ENCOUNTER — Other Ambulatory Visit: Payer: Self-pay | Admitting: Psychiatry

## 2022-12-23 DIAGNOSIS — F3341 Major depressive disorder, recurrent, in partial remission: Secondary | ICD-10-CM

## 2022-12-23 DIAGNOSIS — F4310 Post-traumatic stress disorder, unspecified: Secondary | ICD-10-CM

## 2022-12-24 ENCOUNTER — Other Ambulatory Visit: Payer: Self-pay

## 2022-12-24 ENCOUNTER — Other Ambulatory Visit: Payer: Self-pay | Admitting: Psychiatry

## 2022-12-24 DIAGNOSIS — F4310 Post-traumatic stress disorder, unspecified: Secondary | ICD-10-CM

## 2022-12-24 DIAGNOSIS — F3341 Major depressive disorder, recurrent, in partial remission: Secondary | ICD-10-CM

## 2022-12-25 ENCOUNTER — Ambulatory Visit (INDEPENDENT_AMBULATORY_CARE_PROVIDER_SITE_OTHER): Payer: Medicaid Other | Admitting: Licensed Clinical Social Worker

## 2022-12-25 ENCOUNTER — Encounter: Payer: Self-pay | Admitting: Orthopaedic Surgery

## 2022-12-25 ENCOUNTER — Ambulatory Visit (INDEPENDENT_AMBULATORY_CARE_PROVIDER_SITE_OTHER): Payer: Medicaid Other | Admitting: Orthopaedic Surgery

## 2022-12-25 ENCOUNTER — Other Ambulatory Visit: Payer: Self-pay

## 2022-12-25 DIAGNOSIS — F4310 Post-traumatic stress disorder, unspecified: Secondary | ICD-10-CM

## 2022-12-25 DIAGNOSIS — M545 Low back pain, unspecified: Secondary | ICD-10-CM

## 2022-12-25 DIAGNOSIS — F4010 Social phobia, unspecified: Secondary | ICD-10-CM

## 2022-12-25 DIAGNOSIS — Z96651 Presence of right artificial knee joint: Secondary | ICD-10-CM

## 2022-12-25 DIAGNOSIS — F331 Major depressive disorder, recurrent, moderate: Secondary | ICD-10-CM | POA: Diagnosis not present

## 2022-12-25 MED ORDER — OXYCODONE HCL 5 MG PO TABS
5.0000 mg | ORAL_TABLET | Freq: Four times a day (QID) | ORAL | 0 refills | Status: DC | PRN
  Filled 2022-12-25: qty 30, 4d supply, fill #0

## 2022-12-25 MED ORDER — CYCLOBENZAPRINE HCL 10 MG PO TABS
10.0000 mg | ORAL_TABLET | Freq: Three times a day (TID) | ORAL | 2 refills | Status: AC | PRN
  Filled 2022-12-25: qty 30, 10d supply, fill #0
  Filled 2023-07-05: qty 30, 10d supply, fill #1
  Filled 2023-09-24: qty 30, 10d supply, fill #2

## 2022-12-25 MED FILL — Quetiapine Fumarate Tab 50 MG: ORAL | 30 days supply | Qty: 30 | Fill #0 | Status: AC

## 2022-12-25 NOTE — Progress Notes (Signed)
THERAPIST PROGRESS NOTE Virtual Visit via Video Note  I connected with [name] on 12/25/22 at  8:00 AM EST by a video enabled telemedicine application and verified that I am speaking with the correct person using two identifiers.  Location: Patient: Address on file Provider: Office   I discussed the limitations of evaluation and management by telemedicine and the availability of in person appointments. The patient expressed understanding and agreed to proceed.    I discussed the assessment and treatment plan with the patient. The patient was provided an opportunity to ask questions and all were answered. The patient agreed with the plan and demonstrated an understanding of the instructions.   The patient was advised to call back or seek an in-person evaluation if the symptoms worsen or if the condition fails to improve as anticipated.  I provided 42 minutes of non-face-to-face time during this encounter.   Dereck Leep, LCSW   Session Time: 8:00 am-8:42am  Participation Level: Active  Behavioral Response: CasualAlertEuthymic  Type of Therapy: Individual Therapy  Treatment Goals addressed: STG: [name] will identify negative coping strategies that have been used to cope with the feelings associated with the trauma  as evidenced by pt self report 3 out of 5 sessions documented.    ProgressTowards Goals: Progressing  Interventions: CBT, Supportive, Solution Focused  Summary: [name] is a 57 y.o. female who presents with symptoms of anxiety and depression.  Patient cites symptoms to include uncontrollable worry, tearfulness, negative self affect, difficulties with in relationships.  Patient was oriented x 5.  Patient was cooperative and engaged.  Patient acknowledges suicidal ideations but denies intent and plan at this time.  Patient utilized therapeutic space to process postop symptoms from knee surgery.  Patient reports she is 5 out of 10 in on the pain scale  stating "I am in a lot of pain."  Patient reports she has a doctor's appointment later today and shared updates with physical therapy.  Patient processed recent holiday gatherings.  Patient denies social anxiety symptoms interfering with her presence and ability to participate in family outings.  Processed feelings about her father's health and sharing responsibilities.  Patient reflected on ways in which her support system has helped in taking care of her during her knee surgery as well as caring for her father as she cannot assume the same role.  Patient states "I knew they would help me if I would let them."  Processed feelings of discomfort about not being able to care of herself and difficulties with trusting others.  Challenge negative thought "everyone is going to let me down."  Processed and challenged her role as a caregiver for everyone else.  Planned for negative outcomes and learned about CBT techniques and challenging negative cognitions.  Homework: Patient will acknowledge unhelpful thoughts and process the "what if's "regarding her anxious thoughts.  Patient will engage in problem solving techniques and address the likelihood of worst-case scenario happening.  Patient will identify ways in which she can continue to delegate tasks and ask for help.  Suicidal/Homicidal: Yeswithout intent/plan  Therapist Response: Clinician utilized active and supportive reflection to create a safe environment for patient to process recent life stressors and symptoms.  Clinician utilized supportive reflection to process patient's holiday, feelings about recent knee surgery, and role as caregiver for her father post diagnosis of dementia.  Clinician utilized components of CBT to acknowledge and challenge patient's negative cognition of "everyone is going to let me down."  Educated patient on  ways she can challenge her negative cognitions and reframe.  Utilized solution focused techniques to identifies ways  patient can continue to delegate tasks and challenge herself to let others help her.  Plan: Return again in 1 week.  Diagnosis: PTSD (post-traumatic stress disorder)  Social anxiety disorder  MDD (major depressive disorder), recurrent episode, moderate (HCC)   Collaboration of Care: AEB psychiatrist can access notes and cln. Will review psychiatrists' notes. Check in with the patient and will see LCSW per availability. Patient agreed with treatment recommendations.  Patient/Guardian was advised Release of Information must be obtained prior to any record release in order to collaborate their care with an outside provider. Patient/Guardian was advised if they have not already done so to contact the registration department to sign all necessary forms in order for [place] to release information regarding their care.   Consent: Patient/Guardian gives verbal consent for treatment and assignment of benefits for services provided during this visit. Patient/Guardian expressed understanding and agreed to proceed.   Dereck Leep, LCSW 12/25/2022

## 2022-12-25 NOTE — Progress Notes (Signed)
The patient is here for first postoperative visit status post a right total knee replacement.  She is currently in outpatient therapy.  She has been wearing her compressive hose and been taking a baby aspirin twice a day.  She does need a refill of her pain medication.  On exam her extension is almost full and her flexion is 90 degrees of the right knee.  The staples were removed and Steri-Strips applied.  Her calf is soft.  She will continue her outpatient physical therapy and we would like to see her back in 4 weeks for repeat exam but no x-rays are needed.  When she runs low on pain medication she will let us know.  I did send in some more to her pharmacy today.

## 2023-01-03 ENCOUNTER — Ambulatory Visit (INDEPENDENT_AMBULATORY_CARE_PROVIDER_SITE_OTHER): Payer: Medicaid Other | Admitting: Licensed Clinical Social Worker

## 2023-01-03 DIAGNOSIS — F4310 Post-traumatic stress disorder, unspecified: Secondary | ICD-10-CM

## 2023-01-03 DIAGNOSIS — F331 Major depressive disorder, recurrent, moderate: Secondary | ICD-10-CM | POA: Diagnosis not present

## 2023-01-03 DIAGNOSIS — F4010 Social phobia, unspecified: Secondary | ICD-10-CM

## 2023-01-03 NOTE — Progress Notes (Signed)
THERAPIST PROGRESS NOTE  Virtual Visit via Video Note  I connected with [name] on 01/03/23 at 11:00 AM EST by a video enabled telemedicine application and verified that I am speaking with the correct person using two identifiers.  Location: Patient: Address on File  Provider: [facility]   I discussed the limitations of evaluation and management by telemedicine and the availability of in person appointments. The patient expressed understanding and agreed to proceed.   I discussed the assessment and treatment plan with the patient. The patient was provided an opportunity to ask questions and all were answered. The patient agreed with the plan and demonstrated an understanding of the instructions.   The patient was advised to call back or seek an in-person evaluation if the symptoms worsen or if the condition fails to improve as anticipated.  I provided 43 minutes of non-face-to-face time during this encounter.   Dereck Leep, LCSW   Session Time: 11:01am-11:43 am  Participation Level: Active  Behavioral Response: CasualAlertEuthymic  Type of Therapy: Individual Therapy  Treatment Goals addressed: STG: Narjes will identify negative coping strategies that have been used to cope with the feelings associated with the trauma  as evidenced by pt self report 3 out of 5 sessions documented.    ProgressTowards Goals: Progressing  Interventions: CBT, Solution Focused, Strength-based, and Supportive  Summary: [name] is a 57 y.o. female who presents with symptoms of anxiety and depression.  Patient cites symptoms to include uncontrollable worry, negative self affect, low mood. Pt was oriented times 5. Pt was cooperative and engaged. Pt denies SI/HI/AVH.     Patient utilized therapeutic space to process current physical health following knee surgery.  Patient identified pain level is stronger today following physical therapy.  Patient expressed pride as she is exceeding  goals of physical therapy.   Patient also reflected on growth as she attended a recent Christmas dinner with over 80 people.  Patient identified anxious symptoms were less severe and if she experienced difficulty, she was able to take breaks when needed.  Reflected on limited socialization due to physical limitations as a result of knee surgery.  Patient identified depressive symptoms have worsened due to confinement in the home.  Processed ways in which socializing with family has shifted.  Patient identifies she is able to support her father via telephone calls and with him visiting her home.  Patient has also experienced success delegating caretaking tasks with her sister and her husband.  Patient expressed excitement as in 10 days she will be driving again.  Patient identified this next step provided hope for her in accomplishing further independence.  Continue to process her feelings about her father's diagnosis of dementia.  Patient demonstrated a growth in her ability to reflect on what she can control and efforts to reframe unhelpful thinking patterns.  Patient shared concerns about financial strain and her current process of appealing Social Security decisions in order to obtain disability.  Suicidal/Homicidal: Yeswithout intent/plan  Therapist Response: Clinician utilized active and supportive reflection to create a safe environment for patient to process recent life stressors and symptoms.  Clinician assessed for current stressors, symptoms, safety since last session. Reviewed ways in which the Patient acknowledged unhelpful thoughts and process the "what if's "regarding her anxious thoughts engaging in problem solving techniques and address the likelihood of worst-case scenario happening.  Patient reflected on ways in which she can continue to delegate tasks and ask for help.  Supported progress patient has made and giving  herself grace within her healing journey.    Plan: Return again  in 1 weeks.  Diagnosis: PTSD (post-traumatic stress disorder)  Social anxiety disorder  MDD (major depressive disorder), recurrent episode, moderate (HCC)   Collaboration of Care: AEB psychiatrist can access notes and cln. Will review psychiatrists' notes. Check in with the patient and will see LCSW per availability. Patient agreed with treatment recommendations.   Patient/Guardian was advised Release of Information must be obtained prior to any record release in order to collaborate their care with an outside provider. Patient/Guardian was advised if they have not already done so to contact the registration department to sign all necessary forms in order for [place] to release information regarding their care.   Consent: Patient/Guardian gives verbal consent for treatment and assignment of benefits for services provided during this visit. Patient/Guardian expressed understanding and agreed to proceed.   Dereck Leep, LCSW 01/03/2023

## 2023-01-09 ENCOUNTER — Ambulatory Visit: Payer: Medicaid Other | Admitting: Gastroenterology

## 2023-01-09 ENCOUNTER — Encounter: Payer: Self-pay | Admitting: Gastroenterology

## 2023-01-09 ENCOUNTER — Other Ambulatory Visit: Payer: Self-pay

## 2023-01-09 VITALS — BP 110/70 | HR 84 | Ht 62.0 in | Wt 150.2 lb

## 2023-01-09 DIAGNOSIS — K580 Irritable bowel syndrome with diarrhea: Secondary | ICD-10-CM

## 2023-01-09 DIAGNOSIS — Z9884 Bariatric surgery status: Secondary | ICD-10-CM | POA: Diagnosis not present

## 2023-01-09 DIAGNOSIS — K289 Gastrojejunal ulcer, unspecified as acute or chronic, without hemorrhage or perforation: Secondary | ICD-10-CM | POA: Diagnosis not present

## 2023-01-09 DIAGNOSIS — K529 Noninfective gastroenteritis and colitis, unspecified: Secondary | ICD-10-CM

## 2023-01-09 MED ORDER — DIPHENOXYLATE-ATROPINE 2.5-0.025 MG PO TABS: 1.0000 | ORAL_TABLET | Freq: Four times a day (QID) | ORAL | 1 refills | Status: AC | PRN

## 2023-01-09 MED ORDER — RIFAXIMIN 550 MG PO TABS: 550.0000 mg | ORAL_TABLET | Freq: Three times a day (TID) | ORAL | Status: AC

## 2023-01-09 MED ORDER — DIPHENOXYLATE-ATROPINE 2.5-0.025 MG PO TABS
1.0000 | ORAL_TABLET | Freq: Four times a day (QID) | ORAL | 1 refills | Status: DC | PRN
  Filled 2023-01-09: qty 90, 23d supply, fill #0

## 2023-01-09 NOTE — Progress Notes (Signed)
HPI :  57 y/o female with a history of Roux-en-Y gastric bypass in 2007, history of NSAID use with perforated marginal ulcer at the gastrojejunal anastomosis requiring ex lap and patch repair in February 2020, chronic diarrhea, here for follow up.  The main issue she has had and reason for visit today is chronic diarrhea, suspected IBS-D.  She has had this ongoing for years.  Recall she had a colonoscopy in May 2022 without evidence of microscopic colitis.  She historically had a very mildly low pancreatic fecal elastase however trials of Zenpep provided no relief.  At 1 point she was drinking alcohol routinely upwards of 6 beers a day but she has not had alcohol for around 3 years or so and states she has been sober.  We have tried a few regimens for her symptoms.  She is currently taking Imodium 6 times per day and that helps to some extent but her symptoms persist.  Most recently she has been on colestipol 2 g twice daily.  We had placed her on a low-dose and titrated up, it sounds like she does not feel like it is providing significant benefit.  She still has 3-10 bowel movements per day, she has occasional accidents with this.  She does have some gas and bloating which bothers her.  She was post to have testing for SIBO but has not had it done yet.  She is not able to work due to symptoms, on disability.  She recently had knee replacement in November, on oxycodone postoperatively, takes Vicodin at baseline for back pain.  Recall she also has a history of perforated marginal ulcer, has been on chronic PPI, omeprazole twice daily dose.  She cracks capsule open and takes with applesauce daily.  She has had no problems with reflux or vomiting, no recurrence of abdominal pains.  She is not taking any NSAIDs.  Otherwise denies complaints today.   Prior workup: EGD 10/17/2018: Esophagogastric landmarks identified. - 1 cm hiatal hernia. - Z-line irregular. Biopsied. - Roux-en-Y gastrojejunostomy with  gastrojejunal anastomosis characterized by healthy appearing mucosa - healed ulcerations with residual clip that remains and very small aspect of a buried staple - both without ulceration. I did not remove either at this time, I'm not convinced this is causing her symptoms but can consider a repeat exam in another few months   1. Surgical [P], gastric - CHRONIC INACTIVE GASTRITIS, MILD. - THERE IS NO EVIDENCE OF HELICOBACTER PYLORI, DYSPLASIA, OR MALIGNANCY. - SEE COMMENT. 2. Surgical [P], distal esophagus - GASTROESOPHAGEAL JUNCTION MUCOSA WITH MILD INFLAMMATION CONSISTENT WITH GASTROESOPHAGEAL REFLUX. - THERE IS NO EVIDENCE OF GOBLET CELL METAPLASIA, DYSPLASIA, OR MALIGNANCY.     EGD 07/11/2018: Evidence of a Roux-en-Y gastrojejunostomy was found. Pouch measured 5 cm from EGJ to anastomosis. The gastrojejunal anastomosis was characterized by friable mucosa. Two clean based ulcers on jejunal side of anastomsis, one measuring 15mm and one measuring 6 mm. A suture was noted adjacent to the smaller ulcer. This anastomosis was traversed. Afferent and efferent limbs examined about 15 cm each and appeared normal. After withdrawal from the efferent limb there was an area on the gastric side of the anastomosis with persistent bleeding felt due to contact by the scope. 2 clips were applied with hemostasis acheived. A 3rd clip did not engage the tissue and it was removed orally with a forceps. - Two clean based marginal ulcers. - Z-line variable, at the gastroesophageal junction.  - Small hiatal hernia. - Roux-en-Y gastrojejunostomy with  gastrojejunal anastomosis characterized by friable mucosa and contact bleeding requiring 2 clips for hemostasis.   ESOPHAGEAL SQUAMOUS AND CARDIAC MUCOSA WITH NO SPECIFIC HISTOPATHOLOGIC CHANGES - NEGATIVE FOR INTESTINAL METAPLASIA OR DYSPLASIA     EGD 03/11/20: - The exam of the esophagus was otherwise normal. - Evidence of a Roux-en-Y gastrojejunostomy was  found. The gastrojejunal anastomosis was characterized by healthy appearing mucosa and an intact buried staple. Staple was not removed after a few attempts at removal with forceps, buried deeply and not causing any ulceration. - The exam of the gastric pouch was otherwise normal. No obvious fistula, etc. - Biopsies were taken with a cold forceps in the gastric body for Helicobacter pylori testing. - The examined small bowel limb was deeply intubated and normal.   1. Surgical [P], gastric pouch - GASTRIC OXYNTIC MUCOSA WITH CHRONIC GASTRITIS - WARTHIN STARRY STAIN IS NEGATIVE FOR HELICOBACTER PYLORI 2. Surgical [P], esophagus, GE junction - CARDIAC MUCOSA WITH NO SPECIFIC HISTOPATHOLOGIC CHANGES - NEGATIVE FOR INTESTINAL METAPLASIA OR DYSPLASIA   Colonoscopy 06/02/20: The perianal and digital rectal examinations were normal. - The terminal ileum appeared normal. - A few small-mouthed diverticula were found in the sigmoid colon. - Internal hemorrhoids were found during retroflexion. The hemorrhoids were small. - The exam was otherwise without abnormality. - Biopsies for histology were taken with a cold forceps from the right colon, left colon and transverse colon for evaluation of microscopic colitis.   Surgical [P], colon nos, random sites - COLONIC MUCOSA WITH NO SPECIFIC HISTOPATHOLOGIC CHANGES - NEGATIVE FOR ACUTE INFLAMMATION, INCREASED INTRAEPITHELIAL LYMPHOCYTES OR THICKENED SUBEPITHELIAL COLLAGEN TABLE     RUQ [place] 07/30/2018 - normal, no gallstones   CT abdomen / pelvis with contrast 07/10/18 - no acute findings, post gastric bypass anatomy   HIDA scan 02/28/21: - normal  Past Medical History:  Diagnosis Date   Allergy    Anemia    Anxiety    Arthritis    Asthma    Depression    GERD (gastroesophageal reflux disease)    Heart murmur    from childhood- pt had ECHO 12/27/21   Hiatal hernia    Hypertension    Peptic ulcer    PTSD (post-traumatic stress disorder)    UTI  (urinary tract infection)      Past Surgical History:  Procedure Laterality Date   ABDOMINAL HYSTERECTOMY     fibroids   BIOPSY  07/11/2018   Procedure: BIOPSY;  Surgeon: Meryl Dare, MD;  Location: [facility] ENDOSCOPY;  Service: Endoscopy;;   COLONOSCOPY  2014   ESOPHAGOGASTRODUODENOSCOPY (EGD) WITH PROPOFOL N/A 07/11/2018   Procedure: ESOPHAGOGASTRODUODENOSCOPY (EGD) WITH PROPOFOL;  Surgeon: Meryl Dare, MD;  Location: [facility] ENDOSCOPY;  Service: Endoscopy;  Laterality: N/A;   GASTRIC BYPASS  2007   HEMOSTASIS CLIP PLACEMENT  07/11/2018   Procedure: HEMOSTASIS CLIP PLACEMENT;  Surgeon: Meryl Dare, MD;  Location: [facility] ENDOSCOPY;  Service: Endoscopy;;   LAPAROTOMY N/A 03/08/2018   Procedure: EXPLORATORY LAPAROTOMY, REPAIR AND PATCH CLOSURE OF PERFORATED ULCER;  Surgeon: Violeta Gelinas, MD;  Location: [facility] OR;  Service: General;  Laterality: N/A;   TONSILLECTOMY     and adenoids removed as a child   TOTAL KNEE ARTHROPLASTY Right 12/12/2022   Procedure: RIGHT TOTAL KNEE ARTHROPLASTY;  Surgeon: Kathryne Hitch, MD;  Location: [facility] OR;  Service: Orthopedics;  Laterality: Right;   TUBAL LIGATION     Family History  Problem Relation Age of Onset   Stroke Mother    Alzheimer's disease  Mother    Hypertension Father    Cancer Father        Prostate   Dementia Father    Alcohol abuse Sister    Depression Sister    Alcohol abuse Brother    Depression Brother    Diabetes Maternal Grandmother    Alzheimer's disease Maternal Grandmother    Diabetes Maternal Grandfather    Liver disease Maternal Grandfather    Cancer Maternal Grandfather        Prostate   Diabetes Paternal Grandmother    Alzheimer's disease Paternal Grandmother    Diabetes Paternal Grandfather    Cancer Paternal Grandfather        Lung, Prostate   Colon cancer Neg Hx    Esophageal cancer Neg Hx    Pancreatic cancer Neg Hx    Stomach cancer Neg Hx    Colon polyps Neg Hx    Rectal cancer Neg Hx    Social  History   Tobacco Use   Smoking status: Former    Current packs/day: 0.00    Types: Cigarettes    Quit date: 01/24/1987    Years since quitting: 35.9   Smokeless tobacco: Never  Vaping Use   Vaping status: Former   Substances: Flavoring   Devices: has vaped with flavor but no nicotine. Been 8 years  Substance Use Topics   Alcohol use: Not Currently    Comment: pt has not drank alcohol since 02/2020   Drug use: No   Current Outpatient Medications  Medication Sig Dispense Refill   acetaminophen (TYLENOL) 650 MG CR tablet Take 1,300 mg by mouth every 8 (eight) hours as needed (pain).     aspirin 81 MG chewable tablet Chew 1 tablet (81 mg total) by mouth 2 (two) times daily. 30 tablet 0   busPIRone (BUSPAR) 15 MG tablet Take 1 tablet by mouth 2 (two) times daily. Take daily at 9 AM and at 4 PM 180 tablet 0   Cholecalciferol (VITAMIN D3) 50 MCG (2000 UT) capsule Take 1 capsule (2,000 Units total) by mouth daily. 100 capsule 0   colestipol (COLESTID) 1 g tablet Take 2 tablets (2 g total) by mouth 2 (two) times daily. Take 2 hours away from other medications. 120 tablet 11   cyanocobalamin (VITAMIN B12) 1000 MCG/ML injection Inject 1 mL (1,000 mcg total) into the muscle every 30 (thirty) days. 10 mL 0   cyclobenzaprine (FLEXERIL) 10 MG tablet Take 1 tablet (10 mg total) by mouth 3 (three) times daily as needed. for muscle spams 30 tablet 2   Diclofenac Sodium 2 % SOLN Apply 1 pump twice daily as needed. 112 g 1   estradiol (DOTTI) 0.1 MG/24HR patch Place 1 patch (0.1 mg total) onto the skin 2 (two) times a week. 8 patch 2   estradiol (ESTRACE) 0.1 MG/GM vaginal cream Apply a pea-sized amount of cream to the fingertip and wipe in the front part of the vagina twice weekly 42.5 g 0   fluticasone (FLONASE) 50 MCG/ACT nasal spray Place 2 sprays into both nostrils daily. (Patient taking differently: Place 2 sprays into both nostrils daily as needed for allergies.) 16 g 5   furosemide (LASIX) 40 MG  tablet Take 1 tablet (40 mg total) by mouth daily. 90 tablet 1   Homeopathic Products (ALLERGY MEDICINE PO) Take 1 tablet by mouth daily as needed (Takes the Clortabs. Equate Brand for allergies).     hydrOXYzine (VISTARIL) 25 MG capsule Take 1 capsule (25 mg total) by  mouth 2 (two) times daily as needed for anxiety. And sleep, racing thoughts (Patient taking differently: Take 25 mg by mouth 2 (two) times daily. And sleep, racing thoughts) 60 capsule 1   loperamide (IMODIUM) 2 MG capsule Take 2 mg by mouth as needed for diarrhea or loose stools.     omeprazole (PRILOSEC) 40 MG capsule Take 1 capsule (40 mg total) by mouth 2 (two) times daily. OPEN CAPSULE AND SPRINKLE ON APPLESAUCE. 60 capsule 11   OVER THE COUNTER MEDICATION Take 2 capsules by mouth daily. Mushroom Complex     oxyCODONE (OXY IR/ROXICODONE) 5 MG immediate release tablet Take 1-2 tablets (5-10 mg total) by mouth every 6 (six) hours as needed for moderate pain (pain score 4-6) (pain score 4-6). 30 tablet 0   Probiotic Product (ALIGN) CHEW Chew 2 tablets by mouth daily. Align pre and probiotic with vitamain C-take one tablet each Align pre and probiotic with Vitamin b12 for immune and energy-take one tablet each     QUEtiapine (SEROQUEL) 50 MG tablet Take 1 tablet (50 mg total) by mouth at bedtime. 30 tablet 2   rOPINIRole (REQUIP) 1 MG tablet Take 1 tablet (1 mg total) by mouth at bedtime. 90 tablet 1   sertraline (ZOLOFT) 100 MG tablet Take 2 tablets (200 mg total) by mouth daily. 180 tablet 1   SYRINGE-NEEDLE, DISP, 3 ML (B-D 3CC LUER-LOK SYR 25GX5/8") 25G X 5/8" 3 ML MISC USE AS DIRECTED WITH B-12 INJECTION 16 each 2   No current facility-administered medications for this visit.   Allergies  Allergen Reactions   Lamictal [Lamotrigine] Rash   Penicillins Other (See Comments)    Did it involve swelling of the face/tongue/throat, SOB, or low BP? Unk Did it involve sudden or severe rash/hives, skin peeling, or any reaction on the  inside of your mouth or nose? Unk Did you need to seek medical attention at a hospital or doctor's office? Unk When did it last happen? Was 57 years old; reaction not recalled     If all above answers are "NO", may proceed with cephalosporin use.    Codeine Itching    Crazy dreams and itching-pt said she can take it with food     Review of Systems: All systems reviewed and negative except where noted in HPI.    DG Knee Right Port Result Date: 12/12/2022 CLINICAL DATA:  Status post right knee arthroplasty. EXAM: PORTABLE RIGHT KNEE - 1-2 VIEW COMPARISON:  Right knee radiographs 08/18/2022 FINDINGS: Interval total right knee arthroplasty. No perihardware lucency is seen to indicate hardware failure or loosening. Expected postoperative changes including intra-articular and subcutaneous air. Small joint effusion. Anterior surgical skin staples. No acute fracture or dislocation. IMPRESSION: Interval total right knee arthroplasty without evidence of hardware failure. Electronically Signed   By: Neita Garnet M.D.   On: 12/12/2022 15:21    Physical Exam: BP 110/70 (BP Location: Left Arm, Patient Position: Sitting, Cuff Size: Normal)   Pulse 84   Ht 5\' 2"  (1.575 m)   Wt 150 lb 4 oz (68.2 kg)   BMI 27.48 kg/m  Constitutional: Pleasant,well-developed, female in no acute distress. Neurological: Alert and oriented to person place and time. Psychiatric: Normal mood and affect. Behavior is normal.   ASSESSMENT: 57 y.o. female here for assessment of the following  1. Irritable bowel syndrome with diarrhea   2. Chronic diarrhea   3. Marginal ulcers   4. History of Roux-en-Y gastric bypass    Chronic symptoms that have  persisted and her diarrhea appears a bit worse since have last seen her.  Clearly colestipol at higher doses is not helping so we will stop that.  Her symptoms have been difficult to control with Imodium otherwise.  We discussed some other options for her.  Due to her history of  narcotic use, history of alcohol use, while Viberzi may help reduce her stool frequency we will hold off on that for now given active narcotic use.  She does have some gas and bloating, we consider testing for SIBO in the past but she was not able to have that done.  I think a 2-week course of this is reasonable given her bloating, gastric bypass anatomy, see if this will provide some benefit to her symptoms.  We had a free sample in the office today, gave her 550 mg 3 times daily for 2 weeks and we will see if this helps her.  I will otherwise switch her Imodium to Lomotil and discussed how to use this with her.  She is agreeable.  Hopefully this regimen provides her some relief.  I asked her to contact me in a few weeks for an update and see how she is doing.  We otherwise discussed her history of marginal ulcer with perforation in the past, on PPI prophylaxis to reduce risk of recurrence.  She is avoiding NSAIDs.  We discussed long-term risks of chronic PPI to include CKD, increased risk for bone fracture etc., she understands and wants to continue for now.  PLAN: - Rifaximin 550mg  TID for 2 weeks - free sample given to her today - switch immodium to lomotil - 1 tab every 4 hours PRN  - continue omeprazole - contact me in a few weeks for an update  Harlin Rain, MD [facility]

## 2023-01-09 NOTE — Patient Instructions (Addendum)
_______________________________________________________  If your blood pressure at your visit was 140/90 or greater, please contact your primary care physician to follow up on this.  _______________________________________________________  If you are age 57 or older, your body mass index should be between 23-30. Your Body mass index is 27.48 kg/m. If this is out of the aforementioned range listed, please consider follow up with your Primary Care Provider.  If you are age 36 or younger, your body mass index should be between 19-25. Your Body mass index is 27.48 kg/m. If this is out of the aformentioned range listed, please consider follow up with your Primary Care Provider.   ________________________________________________________  The [place] providers would like to encourage you to use [facility] to communicate with providers for non-urgent requests or questions.  Due to long hold times on the telephone, sending your provider a message by [facility] may be a faster and more efficient way to get a response.  Please allow 48 business hours for a response.  Please remember that this is for non-urgent requests.  _______________________________________________________  Stop imodium.  We have sent the following medications to your pharmacy for you to pick up at your convenience: Lomotil: take one tablet every 4 hours as needed  We have given you samples of the following medication to take:  Xifaxan 550 mg: Take 1 tablet three times a day for 14 days  Continue omeprazole.  Thank you for entrusting me with your care and for choosing [facility], Dr. Ileene Patrick

## 2023-01-10 ENCOUNTER — Ambulatory Visit (INDEPENDENT_AMBULATORY_CARE_PROVIDER_SITE_OTHER): Payer: Medicaid Other | Admitting: Licensed Clinical Social Worker

## 2023-01-10 DIAGNOSIS — F4310 Post-traumatic stress disorder, unspecified: Secondary | ICD-10-CM

## 2023-01-10 DIAGNOSIS — F4010 Social phobia, unspecified: Secondary | ICD-10-CM

## 2023-01-10 DIAGNOSIS — F331 Major depressive disorder, recurrent, moderate: Secondary | ICD-10-CM | POA: Diagnosis not present

## 2023-01-10 NOTE — Progress Notes (Addendum)
THERAPIST PROGRESS NOTE  Virtual Visit via Video Note  I connected with [name] on 01/10/23 at 11:00 AM EST by a video enabled telemedicine application and verified that I am speaking with the correct person using two identifiers.  Location: Patient: Address on file Provider: [facility]   I discussed the limitations of evaluation and management by telemedicine and the availability of in person appointments. The patient expressed understanding and agreed to proceed.    I discussed the assessment and treatment plan with the patient. The patient was provided an opportunity to ask questions and all were answered. The patient agreed with the plan and demonstrated an understanding of the instructions.   The patient was advised to call back or seek an in-person evaluation if the symptoms worsen or if the condition fails to improve as anticipated.  I provided 45 minutes of non-face-to-face time during this encounter.   Dereck Leep, LCSW   Session Time: 11:00am-11:45am  Participation Level: Active  Behavioral Response: CasualAlertEuthymic  Type of Therapy: Individual Therapy  Treatment Goals addressed: Goal: STG: Reduce the negative impact trauma related symptoms have on social, occupational, and family functioning per pt self report 3 out of 5 sessions documented.    LTG: Reduction in intrusive event recollections, avoidance of event reminders, intense arousal, or disinterest in activities or relationships  as evidenced by pt self report 3 out of 5 sessions documented.    STG: Laurice will acknowledge that healing from PTSD is a gradual process  as evidenced by pt self report 3 out of 5 sessions documented.   STG: Raziel will identify negative coping strategies that have been used to cope with the feelings associated with the trauma  as evidenced by pt self report 3 out of 5 sessions documented.   ProgressTowards Goals: Progressing  Interventions: Strength-based,  Supportive, and Other: EMDR  Summary: [name] is a 57 y.o. female who presents with symptoms of anxiety and depression.  Patient identifies symptoms to include uncontrollable worry, negative self affect, low mood, fatigue, tearfulness. Pt was oriented times 5. Pt was cooperative and engaged. Pt denies SI/HI/AVH.     Patient utilized therapeutic space to process positive progress following the knee surgery.  Patient identified she is feeling "depressed "about the upcoming holidays due to limited finances and inability to supply gifts that she has in the past.  Patient became occupied with processing what she cannot do due to her limited physical abilities following a knee surgery.  Worked with clinician to identify focusing on what she can do this holiday season.  Reviewed and addressed goals to process her trauma history in the coming year.  Patient expressed hope about trying EMDR.  Became tearful expressing anxious feelings related to limited expectations and not knowing how EMDR will impact her.  Worked with clinician to reflect on strength and capabilities as a survivor.  Addressed how she is able to incorporate faith in her ability to trust / cope.   Reflected on process of learning how to trust her physical therapist.  Patient identified she feels comfortable with her physical therapist that she is able to explain the process and practices consent into their therapeutic sessions.  Patient also identified physical therapist is encouraging which is helpful for patient's motivation.  Patient identified husband has been very supportive following her surgery.  Patient reports she has expressed gratitude towards her husband.  Processed how current caregiver dynamics may shift in her marriage after her knee is healed.  Suicidal/Homicidal: Nowithout  intent/plan  Therapist Response: Clinician utilized active and supportive reflection to create a safe environment for patient to process recent life  events.  Clinician assessed for current stressors, symptoms, safety since last session.  Worked with patient to identify what she can control in situations versus uncontrollable factors.  Continued to assess for readiness to address trauma history and trauma symptoms.  Reviewed components of EMDR.  Shared with patient the stages of EMDR in order to help with preparation.  Processed positive messages patient can give herself about strength and resiliency going into neck stage of therapy.  Processed coping with her faith.  Plan: Return again in 1 week.  Diagnosis: PTSD (post-traumatic stress disorder)  Social anxiety disorder  MDD (major depressive disorder), recurrent episode, moderate (HCC)   Collaboration of Care: AEB psychiatrist can access notes and cln. Will review psychiatrists' notes. Check in with the patient and will see LCSW per availability. Patient agreed with treatment recommendations.   Patient/Guardian was advised Release of Information must be obtained prior to any record release in order to collaborate their care with an outside provider. Patient/Guardian was advised if they have not already done so to contact the registration department to sign all necessary forms in order for [place] to release information regarding their care.   Consent: Patient/Guardian gives verbal consent for treatment and assignment of benefits for services provided during this visit. Patient/Guardian expressed understanding and agreed to proceed.   Dereck Leep, LCSW 01/10/2023

## 2023-01-12 ENCOUNTER — Telehealth (INDEPENDENT_AMBULATORY_CARE_PROVIDER_SITE_OTHER): Payer: Medicaid Other | Admitting: Psychiatry

## 2023-01-12 ENCOUNTER — Encounter: Payer: Self-pay | Admitting: Psychiatry

## 2023-01-12 ENCOUNTER — Other Ambulatory Visit: Payer: Self-pay

## 2023-01-12 DIAGNOSIS — G2581 Restless legs syndrome: Secondary | ICD-10-CM

## 2023-01-12 DIAGNOSIS — F4010 Social phobia, unspecified: Secondary | ICD-10-CM

## 2023-01-12 DIAGNOSIS — F4310 Post-traumatic stress disorder, unspecified: Secondary | ICD-10-CM | POA: Diagnosis not present

## 2023-01-12 DIAGNOSIS — F1021 Alcohol dependence, in remission: Secondary | ICD-10-CM

## 2023-01-12 DIAGNOSIS — F3341 Major depressive disorder, recurrent, in partial remission: Secondary | ICD-10-CM | POA: Diagnosis not present

## 2023-01-12 MED ORDER — BUSPIRONE HCL 15 MG PO TABS
15.0000 mg | ORAL_TABLET | Freq: Two times a day (BID) | ORAL | 1 refills | Status: DC
  Filled 2023-01-12: qty 180, 90d supply, fill #0
  Filled 2023-04-16: qty 180, 90d supply, fill #1

## 2023-01-12 NOTE — Progress Notes (Signed)
Virtual Visit via Video Note  I connected with [name] on 01/12/23 at 10:30 AM EST by a video enabled telemedicine application and verified that I am speaking with the correct person using two identifiers.  Location Provider Location : [facility] Patient Location : Home  Participants: Patient , Provider    I discussed the limitations of evaluation and management by telemedicine and the availability of in person appointments. The patient expressed understanding and agreed to proceed.   I discussed the assessment and treatment plan with the patient. The patient was provided an opportunity to ask questions and all were answered. The patient agreed with the plan and demonstrated an understanding of the instructions.   The patient was advised to call back or seek an in-person evaluation if the symptoms worsen or if the condition fails to improve as anticipated.   BH MD OP Progress Note  01/12/2023 12:22 PM [name]  MRN:  [id number]  Chief Complaint:  Chief Complaint  Patient presents with   Follow-up   Anxiety   Depression   Medication Refill   HPI: [name] is a 57 year old Caucasian female, married, unemployed, has a history of PTSD, MDD, social anxiety disorder, RLS, alcohol use disorder severe in sustained remission, [place] was evaluated by telemedicine today.  The patient, with a history of PTSD, major depression, social anxiety disorder, restless legs syndrome, alcoholism, and irritable bowel syndrome, presents with recent complications following a total knee replacement surgery. The patient reports a significant setback in recovery after experiencing a blackout and subsequent fall. Prior to this incident, the patient was progressing well in physical therapy, achieving mobility and strength goals.  The patient describes the blackout as a sudden episode of complete darkness, leading to a fall on the recently operated knee. The patient denies any  prolonged unconsciousness, but reports increased pain and swelling in the knee following the fall. The patient's physical therapy progress has regressed since the incident, with a noted decrease in the degree of knee bend achieved.  The patient is currently on a regimen of multiple medications, including Zoloft, Seroquel, hydroxyzine, and oxycodone for pain management post-surgery. The patient has been trying to limit the use of oxycodone to nighttime only, supplementing with Tylenol during the day for pain control.  In addition to the physical setback, the patient also expresses feelings of depression related to the holiday season and financial constraints. The patient denies any suicidal ideation or hallucinations. Despite the recent challenges, the patient remains sober from alcohol .  The patient is also continuing cognitive behavioral therapy for PTSD and major depression.  Visit Diagnosis:    ICD-10-CM   1. PTSD (post-traumatic stress disorder)  F43.10 busPIRone (BUSPAR) 15 MG tablet    2. Recurrent major depressive disorder, in partial remission (HCC)  F33.41     3. Social anxiety disorder  F40.10     4. RLS (restless legs syndrome)  G25.81     5. Alcohol use disorder, severe, in sustained remission (HCC)  F10.21       Past Psychiatric History: I have reviewed past psychiatric history from progress note on 04/04/2021.  Trials of medications-Wellbutrin, Abilify, Zoloft, Xanax, Cymbalta, Pristiq, BuSpar, Deplin, Lexapro, Valium, Viibryd, Trintellix, Lamictal. Patient had psychological testing completed-05/05/2021-Dr. Rodenbough-does not meet criteria for ADHD Reevaluated on 06/01/2022-patient recommended to continue psychiatric treatment for PTSD, depression and sleep.  Past Medical History:  Past Medical History:  Diagnosis Date   Allergy    Anemia    Anxiety  Arthritis    Asthma    Depression    GERD (gastroesophageal reflux disease)    Heart murmur    from childhood- pt had  ECHO 12/27/21   Hiatal hernia    Hypertension    Peptic ulcer    PTSD (post-traumatic stress disorder)    UTI (urinary tract infection)     Past Surgical History:  Procedure Laterality Date   ABDOMINAL HYSTERECTOMY     fibroids   BIOPSY  07/11/2018   Procedure: BIOPSY;  Surgeon: Meryl Dare, MD;  Location: [facility] ENDOSCOPY;  Service: Endoscopy;;   COLONOSCOPY  2014   ESOPHAGOGASTRODUODENOSCOPY (EGD) WITH PROPOFOL N/A 07/11/2018   Procedure: ESOPHAGOGASTRODUODENOSCOPY (EGD) WITH PROPOFOL;  Surgeon: Meryl Dare, MD;  Location: [facility] ENDOSCOPY;  Service: Endoscopy;  Laterality: N/A;   GASTRIC BYPASS  2007   HEMOSTASIS CLIP PLACEMENT  07/11/2018   Procedure: HEMOSTASIS CLIP PLACEMENT;  Surgeon: Meryl Dare, MD;  Location[facility] ENDOSCOPY;  Service: Endoscopy;;   LAPAROTOMY N/A 03/08/2018   Procedure: EXPLORATORY LAPAROTOMY, REPAIR AND PATCH CLOSURE OF PERFORATED ULCER;  Surgeon: Violeta Gelinas, MD;  [facility] OR;  Service: General;  Laterality: N/A;   TONSILLECTOMY     and adenoids removed as a child   TOTAL KNEE ARTHROPLASTY Right 12/12/2022   Procedure: RIGHT TOTAL KNEE ARTHROPLASTY;  Surgeon: Kathryne Hitch, MD;  Location: [facility] OR;  Service: Orthopedics;  Laterality: Right;   TUBAL LIGATION      Family Psychiatric History: I have reviewed family psychiatric history from progress note on 04/04/2021.  Family History:  Family History  Problem Relation Age of Onset   Stroke Mother    Alzheimer's disease Mother    Hypertension Father    Cancer Father        Prostate   Dementia Father    Alcohol abuse Sister    Depression Sister    Alcohol abuse Brother    Depression Brother    Diabetes Maternal Grandmother    Alzheimer's disease Maternal Grandmother    Diabetes Maternal Grandfather    Liver disease Maternal Grandfather    Cancer Maternal Grandfather        Prostate   Diabetes Paternal Grandmother    Alzheimer's disease Paternal Grandmother    Diabetes  Paternal Grandfather    Cancer Paternal Grandfather        Lung, Prostate   Colon cancer Neg Hx    Esophageal cancer Neg Hx    Pancreatic cancer Neg Hx    Stomach cancer Neg Hx    Colon polyps Neg Hx    Rectal cancer Neg Hx     Social History: I have reviewed social history from progress note on 04/04/2021. Social History   Socioeconomic History   Marital status: Married    Spouse name: mark   Number of children: 0   Years of education: Not on file   Highest education level: High school graduate  Occupational History    Employer: [employer]    Comment: [employer]  Tobacco Use   Smoking status: Former    Current packs/day: 0.00    Types: Cigarettes    Quit date: 01/24/1987    Years since quitting: 35.9   Smokeless tobacco: Never  Vaping Use   Vaping status: Former   Substances: Flavoring   Devices: has vaped with flavor but no nicotine. Been 8 years  Substance and Sexual Activity   Alcohol use: Not Currently    Comment: pt has not drank alcohol since 02/2020  Drug use: No   Sexual activity: Not Currently  Other Topics Concern   Not on file  Social History Narrative   Regular exercise--no (because of knees)   Social Drivers of Health   Financial Resource Strain: High Risk (11/07/2022)   Overall Financial Resource Strain (CARDIA)    Difficulty of Paying Living Expenses: Very hard  Food Insecurity: Food Insecurity Present (12/12/2022)   Hunger Vital Sign    Worried About Running Out of Food in the Last Year: Sometimes true    Ran Out of Food in the Last Year: Sometimes true  Transportation Needs: No Transportation Needs (12/12/2022)   PRAPARE - Administrator, Civil Service (Medical): No    Lack of Transportation (Non-Medical): No  Physical Activity: Not on file  Stress: Not on file  Social Connections: Moderately Integrated (11/07/2022)   Social Connection and Isolation Panel [NHANES]    Frequency of Communication with Friends and Family: More than  three times a week    Frequency of Social Gatherings with Friends and Family: More than three times a week    Attends Religious Services: More than 4 times per year    Active Member of [employer] or Organizations: No    Attends Banker Meetings: Never    Marital Status: Married    Allergies:  Allergies  Allergen Reactions   Lamictal [Lamotrigine] Rash   Penicillins Other (See Comments)    Did it involve swelling of the face/tongue/throat, SOB, or low BP? Unk Did it involve sudden or severe rash/hives, skin peeling, or any reaction on the inside of your mouth or nose? Unk Did you need to seek medical attention at a hospital or doctor's office? Unk When did it last happen? Was 57 years old; reaction not recalled     If all above answers are "NO", may proceed with cephalosporin use.    Codeine Itching    Crazy dreams and itching-pt said she can take it with food    Metabolic Disorder Labs: Lab Results  Component Value Date   HGBA1C 5.3 05/24/2022   MPG 105.41 05/24/2022   Lab Results  Component Value Date   PROLACTIN 16.9 05/24/2022   Lab Results  Component Value Date   CHOL 174 04/07/2022   TRIG 69 04/07/2022   HDL 66 04/07/2022   CHOLHDL 2.6 04/07/2022   VLDL 9.0 07/19/2021   LDLCALC 92 04/07/2022   LDLCALC 70 07/19/2021   Lab Results  Component Value Date   TSH 3.15 08/04/2022   TSH 1.87 04/07/2022    Therapeutic Level Labs: No results found for: "LITHIUM" No results found for: "VALPROATE" No results found for: "CBMZ"  Current Medications: Current Outpatient Medications  Medication Sig Dispense Refill   acetaminophen (TYLENOL) 650 MG CR tablet Take 1,300 mg by mouth every 8 (eight) hours as needed (pain).     aspirin 81 MG chewable tablet Chew 1 tablet (81 mg total) by mouth 2 (two) times daily. (Patient taking differently: Chew 81 mg by mouth daily. Takes differently) 30 tablet 0   Cholecalciferol (VITAMIN D3) 50 MCG (2000 UT) capsule Take 1 capsule  (2,000 Units total) by mouth daily. 100 capsule 0   cyanocobalamin (VITAMIN B12) 1000 MCG/ML injection Inject 1 mL (1,000 mcg total) into the muscle every 30 (thirty) days. 10 mL 0   cyclobenzaprine (FLEXERIL) 10 MG tablet Take 1 tablet (10 mg total) by mouth 3 (three) times daily as needed. for muscle spams 30 tablet 2   Diclofenac  Sodium 2 % SOLN Apply 1 pump twice daily as needed. 112 g 1   diphenoxylate-atropine (LOMOTIL) 2.5-0.025 MG tablet Take 1 tablet by mouth 4 (four) times daily as needed for diarrhea or loose stools. 90 tablet 1   diphenoxylate-atropine (LOMOTIL) 2.5-0.025 MG tablet Take 1 tablet by mouth 4 (four) times daily as needed for diarrhea or loose stools. 90 tablet 1   estradiol (DOTTI) 0.1 MG/24HR patch Place 1 patch (0.1 mg total) onto the skin 2 (two) times a week. 8 patch 2   estradiol (ESTRACE) 0.1 MG/GM vaginal cream Apply a pea-sized amount of cream to the fingertip and wipe in the front part of the vagina twice weekly 42.5 g 0   fluticasone (FLONASE) 50 MCG/ACT nasal spray Place 2 sprays into both nostrils daily. (Patient taking differently: Place 2 sprays into both nostrils daily as needed for allergies.) 16 g 5   furosemide (LASIX) 40 MG tablet Take 1 tablet (40 mg total) by mouth daily. 90 tablet 1   Homeopathic Products (ALLERGY MEDICINE PO) Take 1 tablet by mouth daily as needed (Takes the Clortabs. Equate Brand for allergies).     hydrOXYzine (VISTARIL) 25 MG capsule Take 1 capsule (25 mg total) by mouth 2 (two) times daily as needed for anxiety. And sleep, racing thoughts (Patient taking differently: Take 25 mg by mouth 2 (two) times daily. And sleep, racing thoughts) 60 capsule 1   omeprazole (PRILOSEC) 40 MG capsule Take 1 capsule (40 mg total) by mouth 2 (two) times daily. OPEN CAPSULE AND SPRINKLE ON APPLESAUCE. 60 capsule 11   OVER THE COUNTER MEDICATION Take 2 capsules by mouth daily. Mushroom Complex     oxyCODONE (OXY IR/ROXICODONE) 5 MG immediate release  tablet Take 1-2 tablets (5-10 mg total) by mouth every 6 (six) hours as needed for moderate pain (pain score 4-6) (pain score 4-6). 30 tablet 0   Probiotic Product (ALIGN) CHEW Chew 2 tablets by mouth daily. Align pre and probiotic with vitamain C-take one tablet each Align pre and probiotic with Vitamin b12 for immune and energy-take one tablet each     QUEtiapine (SEROQUEL) 50 MG tablet Take 1 tablet (50 mg total) by mouth at bedtime. 30 tablet 2   rifaximin (XIFAXAN) 550 MG TABS tablet Take 1 tablet (550 mg total) by mouth 3 (three) times daily for 14 days.     rOPINIRole (REQUIP) 1 MG tablet Take 1 tablet (1 mg total) by mouth at bedtime. 90 tablet 1   sertraline (ZOLOFT) 100 MG tablet Take 2 tablets (200 mg total) by mouth daily. 180 tablet 1   SYRINGE-NEEDLE, DISP, 3 ML (B-D 3CC LUER-LOK SYR 25GX5/8") 25G X 5/8" 3 ML MISC USE AS DIRECTED WITH B-12 INJECTION 16 each 2   busPIRone (BUSPAR) 15 MG tablet Take 1 tablet by mouth 2 (two) times daily. Take daily at 9 AM and at 4 PM 180 tablet 1   No current facility-administered medications for this visit.     Musculoskeletal: Strength & Muscle Tone:  UTA Gait & Station:  Seated Patient leans: N/A  Psychiatric Specialty Exam: Review of Systems  Psychiatric/Behavioral:  Positive for sleep disturbance.        Depressed due to holiday season    There were no vitals taken for this visit.There is no height or weight on file to calculate BMI.  General Appearance: Fairly Groomed  Eye Contact:  Fair  Speech:  Clear and Coherent  Volume:  Normal  Mood:   Depressed due  to being holiday season  Affect:  Congruent  Thought Process:  Goal Directed and Descriptions of Associations: Intact  Orientation:  Full (Time, Place, and Person)  Thought Content: Logical   Suicidal Thoughts:  No  Homicidal Thoughts:  No  Memory:  Immediate;   Fair Recent;   Fair Remote;   Fair  Judgement:  Fair  Insight:  Fair  Psychomotor Activity:  Normal   Concentration:  Concentration: Fair and Attention Span: Fair  Recall:  [employer] of Knowledge: Fair  Language: Fair  Akathisia:  No  Handed:  Right  AIMS (if indicated): not done  Assets:  Desire for Improvement Housing Social Support Transportation  ADL's:  Intact  Cognition: WNL  Sleep:   restless due to surgical pain   Screenings: AIMS    Flowsheet Row Office Visit from 11/01/2022 in [facility] Office Visit from 03/03/2022 in [facility] Office Visit from 01/20/2022 in [facility] Office Visit from 11/17/2021 in [facility] Office Visit from 06/13/2021 in [facility]  AIMS Total Score 0 0 0 0 0      GAD-7    Flowsheet Row Counselor from 11/07/2022 in [facility] Office Visit from 11/01/2022 in [facility] Office Visit from 04/07/2022 in [facility] Office Visit from 01/20/2022 in [facility] Office Visit from 11/17/2021 in [facility]  Total GAD-7 Score 21 20 18 19 20       PHQ2-9    Flowsheet Row Counselor from 11/07/2022 in [facility] Office Visit from 11/01/2022 in [facility] Video Visit from 08/21/2022 in [facility] Video Visit from 07/04/2022 in [facility] Office Visit from 04/07/2022 in [facility]  PHQ-2 Total Score 6 5 1  0 5  PHQ-9 Total Score 20 18 -- -- 19      Flowsheet Row Video Visit from 01/12/2023 in [facility]  Admission (Discharged) from 12/12/2022 in [facility] Video Visit from 11/27/2022 in [facility]  C-SSRS RISK CATEGORY Moderate Risk No Risk Moderate Risk        Assessment and Plan: Ikeria Losier is a 57 year old Caucasian female who has a history of PTSD, MDD, social anxiety, alcohol use disorder in remission, chronic pain was evaluated by telemedicine today.  Patient with continued reactive depression symptoms due to the current holiday season as well as her situational stressors including recent surgery/surgical pain-discussed assessment and plan as noted below.  Major Depressive Disorder in partial remission Major depressive disorder managed with Seroquel 50 mg and Zoloft 200 mg. Reports mood symptoms exacerbated by holidays and financial stress. No suicidal ideation or hallucinations. Risks of untreated depression include worsening mood and potential suicidal ideation. Benefits of continued medication and therapy include mood stabilization. Prefers to continue current treatment plan. - Continue Seroquel 50 mg - Continue Zoloft 200 mg - BuSpar 15 mg twice daily - Continue cognitive behavioral therapy (CBT) with Trula Ore - Start EMDR therapy when able to drive and attend in-person sessions  Post-Traumatic Stress Disorder (PTSD)-unstable PTSD managed with CBT. Planning to start EMDR therapy. Risks of untreated PTSD  include worsening symptoms and impaired functioning. Benefits of continued therapy include symptom reduction and improved coping. Prefers to start EMDR when able to attend in-person sessions. - Continue CBT with Trula Ore - Start EMDR therapy when able to drive and attend in-person sessions  Social Anxiety Disorder-improving Social anxiety disorder managed with hydroxyzine 25 mg twice a day as needed. Risks of untreated social anxiety include impaired social functioning. Benefits of medication include  reduced anxiety symptoms. Prefers to continue current medication regimen. - Continue hydroxyzine 25 mg twice a day as needed  Restless Legs Syndrome-improving Restless legs syndrome managed with ropinirole 1 mg at night. Risks of untreated restless legs syndrome include sleep disturbances and discomfort. Benefits of medication include symptom relief. Prefers to continue current medication regimen. - Continue ropinirole 1 mg at night  Alcohol Use Disorder in Remission Alcohol use disorder in remission since February 2022. Risks of relapse include potential health complications and impaired functioning. Benefits of continued abstinence include improved health and stability. Prefers to continue abstaining from alcohol. - Continue to abstain from alcohol  General Health Maintenance Routine health maintenance discussed. Current medications reviewed and adjusted as needed. - Refill Buspar 15 mg twice a day - Pick up pending hydroxyzine refill from pharmacy  Follow-up - Follow up with psychiatrist on February 27, 2023, at 8:30 AM via video - Contact healthcare provider if any changes or issues arise before the scheduled follow-up Collaboration of Care: Collaboration of Care: Referral or follow-up with counselor/therapist AEB patient encouraged to continue CBT as well as follow up with surgeon as well as primary care provider as needed.  Patient/Guardian was advised Release of Information must be obtained prior to any record release in order to collaborate their care with an outside provider. Patient/Guardian was advised if they have not already done so to contact the registration department to sign all necessary forms in order for [place] to release information regarding their care.   Consent: Patient/Guardian gives verbal consent for treatment and assignment of benefits for services provided during this visit. Patient/Guardian expressed understanding and agreed to proceed.   This note was generated in  part or whole with voice recognition software. Voice recognition is usually quite accurate but there are transcription errors that can and very often do occur. I apologize for any typographical errors that were not detected and corrected.    Jomarie Longs, MD 01/12/2023, 12:23 PM

## 2023-01-15 ENCOUNTER — Other Ambulatory Visit (INDEPENDENT_AMBULATORY_CARE_PROVIDER_SITE_OTHER): Payer: Self-pay

## 2023-01-15 ENCOUNTER — Ambulatory Visit: Payer: Medicaid Other | Admitting: Physician Assistant

## 2023-01-15 ENCOUNTER — Encounter: Payer: Self-pay | Admitting: Physician Assistant

## 2023-01-15 ENCOUNTER — Other Ambulatory Visit: Payer: Self-pay

## 2023-01-15 DIAGNOSIS — Z96651 Presence of right artificial knee joint: Secondary | ICD-10-CM

## 2023-01-15 MED ORDER — OXYCODONE HCL 5 MG PO TABS
5.0000 mg | ORAL_TABLET | Freq: Four times a day (QID) | ORAL | 0 refills | Status: DC | PRN
  Filled 2023-01-15: qty 30, 4d supply, fill #0

## 2023-01-15 NOTE — Progress Notes (Signed)
HPI: Mrs. Shippen returns today due to fall last Wednesday.  She states she blacked out.  She has done this in the past but not to this degree.  She did not seek any medical treatment.  She did see her psychiatrist who felt that her psychiatric medicines along with the pain medicines may have caused this.  She denies any chest pain shortness of breath.  She has had no further episodes.  However she is concerned about her her right total knee components.  She is status post right total knee arthroplasty 12/12/2022.  States that she has had increased pain in the knee since the fall.  Review of systems: See HPI otherwise negative  Physical exam: General Well-developed well-nourished female in no acute distress. Right knee: She has full extension full flexion some pain.  Surgical incisions well-healed.  No signs of infection.  No instability valgus varus stressing calf supple nontender.  No abnormal warmth erythema or effusion.  No ecchymosis.  Radiographs: 2 views right knee shows right total knee arthroplasty components to be well-seated.  There is no signs of any acute fracture findings.  Knee is well located.  Impression: Status post right total knee arthroplasty  Plan: Discussed with her that she should contact her primary care physician given that she had a syncopal episode.  In regards to the knee showed her the radiographs and clinical exam showed no acute injuries outside of possible contusion to the knee.  She will follow-up with Dr. Raye Sorrow in 2 weeks and overall.  Questions were encouraged and answered at length.

## 2023-01-18 ENCOUNTER — Other Ambulatory Visit: Payer: Self-pay

## 2023-01-18 ENCOUNTER — Other Ambulatory Visit (HOSPITAL_COMMUNITY): Payer: Self-pay

## 2023-01-19 ENCOUNTER — Ambulatory Visit (INDEPENDENT_AMBULATORY_CARE_PROVIDER_SITE_OTHER): Payer: Medicaid Other | Admitting: Licensed Clinical Social Worker

## 2023-01-19 DIAGNOSIS — F4010 Social phobia, unspecified: Secondary | ICD-10-CM | POA: Diagnosis not present

## 2023-01-19 DIAGNOSIS — F3341 Major depressive disorder, recurrent, in partial remission: Secondary | ICD-10-CM

## 2023-01-19 DIAGNOSIS — F4310 Post-traumatic stress disorder, unspecified: Secondary | ICD-10-CM
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
## 2023-01-22 ENCOUNTER — Encounter: Payer: Medicaid Other | Admitting: Orthopaedic Surgery

## 2023-01-30 ENCOUNTER — Other Ambulatory Visit: Payer: Self-pay

## 2023-01-30 MED FILL — Quetiapine Fumarate Tab 50 MG: ORAL | 30 days supply | Qty: 30 | Fill #1 | Status: AC

## 2023-01-30 MED FILL — Fluticasone Propionate Nasal Susp 50 MCG/ACT: NASAL | 30 days supply | Qty: 16 | Fill #3 | Status: AC

## 2023-02-01 ENCOUNTER — Ambulatory Visit (INDEPENDENT_AMBULATORY_CARE_PROVIDER_SITE_OTHER): Payer: Medicaid Other | Admitting: Physician Assistant

## 2023-02-01 ENCOUNTER — Encounter: Payer: Self-pay | Admitting: Physician Assistant

## 2023-02-01 DIAGNOSIS — M7061 Trochanteric bursitis, right hip: Secondary | ICD-10-CM | POA: Diagnosis not present

## 2023-02-01 DIAGNOSIS — Z96651 Presence of right artificial knee joint: Secondary | ICD-10-CM | POA: Diagnosis not present

## 2023-02-01 MED ORDER — METHYLPREDNISOLONE ACETATE 40 MG/ML IJ SUSP
40.0000 mg | INTRAMUSCULAR | Status: AC | PRN
  Administered 2023-02-01: 40 mg via INTRA_ARTICULAR

## 2023-02-01 MED ORDER — LIDOCAINE HCL 1 % IJ SOLN
3.0000 mL | INTRAMUSCULAR | Status: AC | PRN
  Administered 2023-02-01: 3 mL

## 2023-02-01 NOTE — Progress Notes (Signed)
 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  She states overall that her left knee is doing well.  She has continue with therapy to work on range of motion strengthening knee.  She has some discomfort in her knees at times but no real pain.  She has stiffness in the morning.  She does note that the left knee seems warmer than the right.  She occasionally takes Tylenol .  Also take some oxycodone  at night.  7 difficulty sleeping.  States that both of her hips bother her right greater than left.  She also notes that the right hip has for years had a pop within the groin area.  She does relate to years ago a dog ran into her hip and caused injury to the hip.  Review of systems: See HPI  Physical exam: General: Well-developed well-nourished female no acute distress ambulates without any assistive device. Right knee: Surgical incisions well-healed.  No instability valgus varus stressing no abnormal warmth or erythema.  Full extension full flexion.  Calf supple nontender. Bilateral hips good range of motion.  Audible pop right hip with good range of motion.  Tenderness right greater than left trochanteric region.  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  In regards to the knee she will continue to work on range of motion strengthening.  She will follow-up with Dr. Vernetta in 2 weeks to see what type of response she had to the injection if she continues to have the pain in the right groin area recommend AP pelvis and a lateral view of the right hip.  Prior x-ray of her pelvis and hip dated 10/25/2021 is reviewed and shows no acute fractures and well maintained joint space.    Procedure Note  Patient: [name]             Date of Birth: 10-Dec-1965            MRN: [id number]             Visit Date: 02/01/2023  Procedures: Visit Diagnoses: No diagnosis found.  Large Joint Inj: R greater trochanter on 02/01/2023 5:37 PM Indications: pain Details: 22 G 1.5 in needle, lateral approach  Arthrogram: No  Medications: 3 mL lidocaine  1 %; 40 mg methylPREDNISolone  acetate 40 MG/ML Outcome: tolerated well, no immediate complications Procedure, treatment alternatives, risks and benefits explained, specific risks discussed. Consent was given by the patient. Immediately prior to procedure a time out was called to verify the correct patient, procedure, equipment, support staff and site/side marked as required. Patient was prepped and draped in the usual sterile fashion.

## 2023-02-02 ENCOUNTER — Ambulatory Visit (INDEPENDENT_AMBULATORY_CARE_PROVIDER_SITE_OTHER): Payer: Medicaid Other | Admitting: Licensed Clinical Social Worker

## 2023-02-02 DIAGNOSIS — F4010 Social phobia, unspecified: Secondary | ICD-10-CM

## 2023-02-02 DIAGNOSIS — F3341 Major depressive disorder, recurrent, in partial remission: Secondary | ICD-10-CM

## 2023-02-02 DIAGNOSIS — F4310 Post-traumatic stress disorder, unspecified: Secondary | ICD-10-CM | POA: Diagnosis not present

## 2023-02-02 NOTE — Progress Notes (Signed)
 THERAPIST PROGRESS NOTE  Virtual Visit via Video Note  I connected with [name] on 02/02/23 at 10:00 AM EST by a video enabled telemedicine application and verified that I am speaking with the correct person using two identifiers.  Location: Patient: Address on File  Provider: Providers Home    I discussed the limitations of evaluation and management by telemedicine and the availability of in person appointments. The patient expressed understanding and agreed to proceed.   I discussed the assessment and treatment plan with the patient. The patient was provided an opportunity to ask questions and all were answered. The patient agreed with the plan and demonstrated an understanding of the instructions.   The patient was advised to call back or seek an in-person evaluation if the symptoms worsen or if the condition fails to improve as anticipated.  I provided 36 minutes of non-face-to-face time during this encounter.   [name] Husband, LCSW   Session Time: 10am-10:36am   Participation Level: Active  Behavioral Response: CasualAlertEuthymic  Type of Therapy: Individual Therapy  Treatment Goals addressed: Goal: STG: Reduce the negative impact trauma related symptoms have on social, occupational, and family functioning per pt self report 3 out of 5 sessions documented.     LTG: Reduction in intrusive event recollections, avoidance of event reminders, intense arousal, or disinterest in activities or relationships  as evidenced by pt self report 3 out of 5 sessions documented.     STG: [name] will acknowledge that healing from PTSD is a gradual process  as evidenced by pt self report 3 out of 5 sessions documented.   STG: [name] will identify negative coping strategies that have been used to cope with the feelings associated with the trauma  as evidenced by pt self report 3 out of 5 sessions documented.    ProgressTowards Goals: Progressing  Interventions: Supportive and  Other: EMDR, Mindfulness   Summary: [name] is a 58 y.o. female who presents with symptoms of trauma and depression.  Patient identifies symptoms to include negative self affect, low mood, fatigue, tearfulness, a couple of dreams. Pt was oriented times 5. Pt was cooperative and engaged. Pt denies SI/HI/AVH.     Shares she has not been sleeping due to hip pain. Pt reports progress in her physical therapy post-surgery and shared excitement as she can begin driving. Mh has improved due to change in routine and ability to get out of the house.   Cln utilized EMDR to educate pt on stabilization techniques such as 7/11 breathing, belly breathing, and accupressure breathing. Practiced utilizing techniques in session and reinforced positive sensations. Pt was asked to practice one breathing technique every day until her next session.   Suicidal/Homicidal: Nowithout intent/plan  Therapist Response: Clinician utilized active and supportive reflection to create a safe environment for patient to process recent life events. Clinician assessed for current stressors, symptoms, safety since last session. Educated pt on stabilization techniques through EMDR.   Plan: Return again in 1 week.  Diagnosis: PTSD (post-traumatic stress disorder)  Recurrent major depressive disorder, in partial remission (HCC)  Social anxiety disorder   Collaboration of Care: AEB psychiatrist can access notes and cln. Will review psychiatrists' notes. Check in with the patient and will see LCSW per availability. Patient agreed with treatment recommendations.   Patient/Guardian was advised Release of Information must be obtained prior to any record release in order to collaborate their care with an outside provider. Patient/Guardian was advised if they have not already done so to  contact the registration department to sign all necessary forms in order for us  to release information regarding their care.   Consent:  Patient/Guardian gives verbal consent for treatment and assignment of benefits for services provided during this visit. Patient/Guardian expressed understanding and agreed to proceed.   [name] Husband, LCSW 02/02/2023

## 2023-02-08 ENCOUNTER — Ambulatory Visit (INDEPENDENT_AMBULATORY_CARE_PROVIDER_SITE_OTHER): Payer: Medicaid Other | Admitting: Licensed Clinical Social Worker

## 2023-02-08 DIAGNOSIS — F4010 Social phobia, unspecified: Secondary | ICD-10-CM | POA: Diagnosis not present

## 2023-02-08 DIAGNOSIS — F3341 Major depressive disorder, recurrent, in partial remission: Secondary | ICD-10-CM | POA: Diagnosis not present

## 2023-02-08 DIAGNOSIS — F4310 Post-traumatic stress disorder, unspecified: Secondary | ICD-10-CM

## 2023-02-08 NOTE — Progress Notes (Deleted)
[name] is a 58 y.o. female patient ***.     Collaboration of Care: {[facility] OP Collaboration of Care:[id number]}  Patient/Guardian was advised Release of Information must be obtained prior to any record release in order to collaborate their care with an outside provider. Patient/Guardian was advised if they have not already done so to contact the registration department to sign all necessary forms in order for [place] to release information regarding their care.   Consent: Patient/Guardian gives verbal consent for treatment and assignment of benefits for services provided during this visit. Patient/Guardian expressed understanding and agreed to proceed.    Dereck Leep, LCSW
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
## 2023-02-14 ENCOUNTER — Ambulatory Visit (INDEPENDENT_AMBULATORY_CARE_PROVIDER_SITE_OTHER): Payer: Medicaid Other | Admitting: Licensed Clinical Social Worker

## 2023-02-14 DIAGNOSIS — F4310 Post-traumatic stress disorder, unspecified: Secondary | ICD-10-CM

## 2023-02-14 DIAGNOSIS — F4010 Social phobia, unspecified: Secondary | ICD-10-CM | POA: Diagnosis not present

## 2023-02-14 DIAGNOSIS — F3341 Major depressive disorder, recurrent, in partial remission: Secondary | ICD-10-CM

## 2023-02-14 NOTE — Progress Notes (Signed)
THERAPIST PROGRESS NOTE  Virtual Visit via Video Note  I connected with [name] on 02/14/23 at 11:00 AM EST by a video enabled telemedicine application and verified that I am speaking with the correct person using two identifiers.  Location: Patient: Address on file  Provider: Providers home   I discussed the limitations of evaluation and management by telemedicine and the availability of in person appointments. The patient expressed understanding and agreed to proceed.   I discussed the assessment and treatment plan with the patient. The patient was provided an opportunity to ask questions and all were answered. The patient agreed with the plan and demonstrated an understanding of the instructions.   The patient was advised to call back or seek an in-person evaluation if the symptoms worsen or if the condition fails to improve as anticipated.  I provided 51 minutes of non-face-to-face time during this encounter.   Dereck Leep, LCSW   Session Time: 11-11:51am  Participation Level: Active  Behavioral Response: CasualAlertEuthymic  Type of Therapy: Individual Therapy  Treatment Goals addressed: Goal: STG: Reduce the negative impact trauma related symptoms have on social, occupational, and family functioning per pt self report 3 out of 5 sessions documented.     LTG: Reduction in intrusive event recollections, avoidance of event reminders, intense arousal, or disinterest in activities or relationships  as evidenced by pt self report 3 out of 5 sessions documented.     STG: [name] will acknowledge that healing from PTSD is a gradual process  as evidenced by pt self report 3 out of 5 sessions documented.   STG: [name] will identify negative coping strategies that have been used to cope with the feelings associated with the trauma  as evidenced by pt self report 3 out of 5 sessions documented.  ProgressTowards Goals: Progressing  Interventions: CBT, Supportive,  Solution Focused  Summary: [name] is a 58 y.o. female who presents with symptoms of trauma and depression. Patient identifies symptoms to include negative self affect, low mood, fatigue, tearfulness, nightmares. Also presents with improving sxs of social anxiety as pt reports improvement in her ability to engage in social activities outside of the home. Pt was oriented times 5. Pt was cooperative and engaged. Pt denies SI/HI/AVH.   Pt utilized session to process recent stressors. Pt reports she is still experiencing difficulties sleeping due to hip pain or fear she will experience a nightmare again. Pt reports she experienced nightmares of people getting killed, last nightmare being Sunday night. Worked with pt to understand trigger for nightmares. She expressed anxiety due to recent news exposure. Worked with patient on ways to limit exposure to triggers via news outlets or electronics to see if nightmares improve.   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 Explored benefits of sobriety. Pt reflected on the opportunity to explore healthy coping skills and her improved self-esteem. Processed ways to manage 'desires.' Explored ways in which engaging in healthier coping skills has assisted in her success in therapy.   Addressed loss of independence and control due to denial of disability and financial strain. Explored resources with the pt to identify community resources. Processed anxious thoughts about her future.    Suicidal/Homicidal: Nowithout intent/plan  Therapist Response: Clinician utilized active and supportive reflection to create a safe environment for patient to process recent life events. Clinician assessed for stressors, symptoms, safety since last session. Explored triggers and problem solved ways to limit exposure. Reviewed coping skills. Processed anxious feelings and unhelpful thoughts  contributing to anxiety.   Plan: Return again in 1  weeks.  Diagnosis: PTSD (post-traumatic stress disorder)  Recurrent major depressive disorder, in partial remission (HCC)  Social anxiety disorder   Collaboration of Care: AEB psychiatrist can access notes and cln. Will review psychiatrists' notes. Check in with the patient and will see LCSW per availability. Patient agreed with treatment recommendations.   Patient/Guardian was advised Release of Information must be obtained prior to any record release in order to collaborate their care with an outside provider. Patient/Guardian was advised if they have not already done so to contact the registration department to sign all necessary forms in order for [place] to release information regarding their care.   Consent: Patient/Guardian gives verbal consent for treatment and assignment of benefits for services provided during this visit. Patient/Guardian expressed understanding and agreed to proceed.   Dereck Leep, LCSW 02/14/2023

## 2023-02-19 ENCOUNTER — Ambulatory Visit (INDEPENDENT_AMBULATORY_CARE_PROVIDER_SITE_OTHER): Payer: Medicaid Other | Admitting: Licensed Clinical Social Worker

## 2023-02-19 ENCOUNTER — Other Ambulatory Visit: Payer: Self-pay

## 2023-02-19 ENCOUNTER — Other Ambulatory Visit: Payer: Self-pay | Admitting: Family Medicine

## 2023-02-19 DIAGNOSIS — F4010 Social phobia, unspecified: Secondary | ICD-10-CM

## 2023-02-19 DIAGNOSIS — M79605 Pain in left leg: Secondary | ICD-10-CM

## 2023-02-19 DIAGNOSIS — F3341 Major depressive disorder, recurrent, in partial remission: Secondary | ICD-10-CM | POA: Diagnosis not present

## 2023-02-19 DIAGNOSIS — F4310 Post-traumatic stress disorder, unspecified: Secondary | ICD-10-CM

## 2023-02-19 DIAGNOSIS — M79604 Pain in right leg: Secondary | ICD-10-CM

## 2023-02-19 NOTE — Progress Notes (Signed)
   THERAPIST PROGRESS NOTE  Session Time: 9:03am-9:47am  Participation Level: Active  Behavioral Response: CasualAlertEuthymic  Type of Therapy: Individual Therapy  Treatment Goals addressed: Goal: STG: Reduce the negative impact trauma related symptoms have on social, occupational, and family functioning per pt self report 3 out of 5 sessions documented.     LTG: Reduction in intrusive event recollections, avoidance of event reminders, intense arousal, or disinterest in activities or relationships  as evidenced by pt self report 3 out of 5 sessions documented.     STG: [name] will acknowledge that healing from PTSD is a gradual process  as evidenced by pt self report 3 out of 5 sessions documented.   STG: [name] will identify negative coping strategies that have been used to cope with the feelings associated with the trauma  as evidenced by pt self report 3 out of 5 sessions documented.  ProgressTowards Goals: Progressing  Interventions: Supportive and Other: EMDR  Summary: [name] is a 58 y.o. female who presents with symptoms of trauma and depression. Patient identifies symptoms to include negative self affect, low mood, fatigue, tearfulness, nightmares.  Patient reports she experienced the best sleep she has had in a while during the previous night where she reports she only awoke twice.  Patient denies the presence of nightmares since her last session reflecting on changes to sleep hygiene and use of her meditation app before bed.  Patient also identifies limited exposure to news outlets before bed.  Pt was oriented times 5. Pt was cooperative and engaged. Pt denies SI/HI/AVH.     Patient utilized therapeutic space to continue to reflect on physical progress and feelings related to exposure to  social outings and further independence as a result of physical improvements.  Patient reflected on anxious moments while out where she experienced symptoms of hypervigilance.  Patient  reflected on successful use of breathing techniques and her container to regulate emotions.  Completed resourcing of EMDR by constructing her peaceful place.  Patient described her peaceful place utilizing all 5 senses and labeled her peaceful place "serenity."  Patient was asked to continue to practice use of her peaceful place daily before her next session.   Suicidal/Homicidal: Nowithout intent/plan  Therapist Response: Clinician utilized active and supportive reflection to create a safe environment for patient to process recent life events.  Clinician assessed for current symptoms, stressors, safety since last session.  Clinician continued to assess for new symptoms as a result of EMDR treatment.  Clinician completed resourcing with patient and worked with patient to construct peaceful place and reinforce positive sensations.  Plan: Return again in 1 week.  Diagnosis: PTSD (post-traumatic stress disorder)  Recurrent major depressive disorder, in partial remission (HCC)  Social anxiety disorder   Collaboration of Care: [facility] psychiatrist can access notes and cln. Will review psychiatrists' notes. Check in with the patient and will see LCSW per availability. Patient agreed with treatment recommendations.     Patient/Guardian was advised Release of Information must be obtained prior to any record release in order to collaborate their care with an outside provider. Patient/Guardian was advised if they have not already done so to contact the registration department to sign all necessary forms in order for [place] to release information regarding their care.   Consent: Patient/Guardian gives verbal consent for treatment and assignment of benefits for services provided during this visit. Patient/Guardian expressed understanding and agreed to proceed.   Dereck Leep, LCSW 02/19/2023

## 2023-02-20 ENCOUNTER — Other Ambulatory Visit: Payer: Self-pay | Admitting: Family Medicine

## 2023-02-20 ENCOUNTER — Other Ambulatory Visit: Payer: Self-pay

## 2023-02-20 DIAGNOSIS — M79604 Pain in right leg: Secondary | ICD-10-CM

## 2023-02-20 MED FILL — Furosemide Tab 40 MG: ORAL | 90 days supply | Qty: 90 | Fill #0 | Status: AC

## 2023-02-21 ENCOUNTER — Encounter: Payer: Self-pay | Admitting: Orthopaedic Surgery

## 2023-02-21 ENCOUNTER — Ambulatory Visit (INDEPENDENT_AMBULATORY_CARE_PROVIDER_SITE_OTHER): Payer: Medicaid Other | Admitting: Orthopaedic Surgery

## 2023-02-21 DIAGNOSIS — M25551 Pain in right hip: Secondary | ICD-10-CM

## 2023-02-21 DIAGNOSIS — M7061 Trochanteric bursitis, right hip: Secondary | ICD-10-CM

## 2023-02-21 DIAGNOSIS — Z96651 Presence of right artificial knee joint: Secondary | ICD-10-CM

## 2023-02-21 NOTE — Progress Notes (Signed)
The patient is just over 2 months status post a right total knee arthroplasty.  She of the right knee is doing great.  However she still dealing with chronic right hip pain.  She saw Bronson Curb earlier this month and he did place a steroid injection of her right hip trochanteric area and this did not help.  He did recommend some plain films today of her hip but that was not obtained.  I did see x-rays of her pelvis and hips from 2023 in October and those were normal-appearing I did review those.  She does report some clicking in the groin but a lot of pain the lateral aspect of both of her hips.  Her total knee is done well.  Examination of her right total knee shows minimal swelling and excellent range of motion.  The knee feels stable on exam.  Her right hip does move smoothly and fluidly but does have clicking in the groin with hip flexion and pain all around the trochanteric area of the hip as well as the ischium.  At this point a MRI is warranted of the right hip to assess the ligaments and tendons as well as the cartilage.  We will see her back once we have MRI of her right hip.  I did praise her for how well she is done with her right knee and she is not taking any pain medication and doing well overall with the knee.  It is the hips that are bothering her the worst of the right worse than the left.  Will see her back once we have MRI of the right hip.

## 2023-02-22 ENCOUNTER — Other Ambulatory Visit: Payer: Self-pay

## 2023-02-22 DIAGNOSIS — M7061 Trochanteric bursitis, right hip: Secondary | ICD-10-CM

## 2023-02-22 DIAGNOSIS — M25551 Pain in right hip: Secondary | ICD-10-CM

## 2023-02-26 ENCOUNTER — Encounter: Payer: Self-pay | Admitting: Orthopaedic Surgery

## 2023-02-27 ENCOUNTER — Other Ambulatory Visit: Payer: Self-pay

## 2023-02-27 ENCOUNTER — Telehealth: Payer: Medicaid Other | Admitting: Psychiatry

## 2023-02-27 ENCOUNTER — Encounter: Payer: Self-pay | Admitting: Psychiatry

## 2023-02-27 DIAGNOSIS — F1021 Alcohol dependence, in remission: Secondary | ICD-10-CM

## 2023-02-27 DIAGNOSIS — Z9189 Other specified personal risk factors, not elsewhere classified: Secondary | ICD-10-CM

## 2023-02-27 DIAGNOSIS — G2581 Restless legs syndrome: Secondary | ICD-10-CM

## 2023-02-27 DIAGNOSIS — F3341 Major depressive disorder, recurrent, in partial remission: Secondary | ICD-10-CM | POA: Diagnosis not present

## 2023-02-27 DIAGNOSIS — F4010 Social phobia, unspecified: Secondary | ICD-10-CM | POA: Diagnosis not present

## 2023-02-27 DIAGNOSIS — F4310 Post-traumatic stress disorder, unspecified: Secondary | ICD-10-CM

## 2023-02-27 MED ORDER — QUETIAPINE FUMARATE 25 MG PO TABS
75.0000 mg | ORAL_TABLET | Freq: Every day | ORAL | 1 refills | Status: DC
  Filled 2023-02-27: qty 90, 30d supply, fill #0
  Filled 2023-03-26: qty 90, 30d supply, fill #1

## 2023-02-27 MED ORDER — HYDROXYZINE PAMOATE 25 MG PO CAPS
25.0000 mg | ORAL_CAPSULE | Freq: Two times a day (BID) | ORAL | 1 refills | Status: DC | PRN
  Filled 2023-02-27: qty 60, 30d supply, fill #0

## 2023-02-27 NOTE — Patient Instructions (Signed)
Please call for EKG-[id number] 

## 2023-02-27 NOTE — Progress Notes (Signed)
 Virtual Visit via Video Note  I connected with [name] on 02/27/23 at  8:30 AM EST by a video enabled telemedicine application and verified that I am speaking with the correct person using two identifiers.  Location Provider Location : [facility] Patient Location : Home  Participants: Patient , Provider   I discussed the limitations of evaluation and management by telemedicine and the availability of in person appointments. The patient expressed understanding and agreed to proceed.    I discussed the assessment and treatment plan with the patient. The patient was provided an opportunity to ask questions and all were answered. The patient agreed with the plan and demonstrated an understanding of the instructions.   The patient was advised to call back or seek an in-person evaluation if the symptoms worsen or if the condition fails to improve as anticipated.   BH MD OP Progress Note  03/01/2023 9:55 AM [name]  MRN:  [id number]  Chief Complaint:  Chief Complaint  Patient presents with   Follow-up   Depression   Anxiety   Insomnia   Medication Refill   HPI: [name] is a 58 year old Caucasian female, married, unemployed, [place] has a history of PTSD, MDD, social anxiety disorder, RLS, alcohol use disorder severe in sustained remission, was evaluated by telemedicine today.  The patient, with depression and anxiety, presents with sleep disturbances and mood symptoms management.  She experiences ongoing sleep disturbances primarily due to pain and discomfort in her hips and back. Severe hip pain prevents her from sleeping on her sides, leading to frequent tossing and turning at night. She recently completed physical therapy following knee surgery and is not using any walking aids.  She is undergoing Eye Movement Desensitization and Reprocessing (EMDR) therapy and has established a 'safe place' to help manage anxiety and panic attacks. Her safe place is on  her deck, where she can hear birds and feel the sun. The therapy is helping her manage anxiety and depression, although she still experiences nightmares and occasional crying upon waking. She takes Seroquel  at bedtime for mood stabilization and sleep, and hydroxyzine  as needed for anxiety.She reports nightmares and waking up crying but cannot recall the dreams. No restless leg symptoms.  She has a history of depression and anxiety, which she feels is slightly improved but still struggles with feelings of purposelessness and frustration over her disability claim process. She has been sober for three years as of February 1st. No recent suicidal thoughts, but she had some about a month ago, which she discussed with her therapist.  Patient describes her thoughts as ' wanting to escape ' ,denies having an intent or plan.  She is no longer taking oxycodone  or Vicodin for pain management, having stopped these medications in early November. She receives B12 injections monthly.  She lives at home and has a support system that includes her sister, who lives five miles away, and her father, who lives three miles away. Her sister and spouse supported her during her recovery from surgery.  Patient currently denies any suicidality, homicidality or perceptual disturbances.  Visit Diagnosis:    ICD-10-CM   1. PTSD (post-traumatic stress disorder)  F43.10 QUEtiapine  (SEROQUEL ) 25 MG tablet    hydrOXYzine  (VISTARIL ) 25 MG capsule    2. Recurrent major depressive disorder, in partial remission (HCC)  F33.41 QUEtiapine  (SEROQUEL ) 25 MG tablet    3. Social anxiety disorder  F40.10 hydrOXYzine  (VISTARIL ) 25 MG capsule    4. RLS (restless legs syndrome)  G25.81     5. Alcohol use disorder, severe, in sustained remission (HCC)  F10.21     6. At risk for prolonged QT interval syndrome  Z91.89 EKG 12-Lead      Past Psychiatric History: I have reviewed past psychiatric history from progress note on 04/04/2021.   Trials of medications-Wellbutrin, Abilify , Zoloft , Xanax, Cymbalta, Pristiq, BuSpar , Deplin, Lexapro , Valium , Viibryd, Trintellix, Lamictal . Patient had psychological testing completed-05/05/2021-Dr. Rodenbough-does not meet criteria for ADHD. Reevaluated on 06/01/2022-patient recommended to continue psychiatric treatment for PTSD, depression and sleep.  Past Medical History:  Past Medical History:  Diagnosis Date   Allergy    Anemia    Anxiety    Arthritis    Asthma    Depression    GERD (gastroesophageal reflux disease)    Heart murmur    from childhood- pt had ECHO 12/27/21   Hiatal hernia    Hypertension    Peptic ulcer    PTSD (post-traumatic stress disorder)    UTI (urinary tract infection)     Past Surgical History:  Procedure Laterality Date   ABDOMINAL HYSTERECTOMY     fibroids   BIOPSY  07/11/2018   Procedure: BIOPSY;  Surgeon: Aneita Gwendlyn DASEN, MD;  Location: [facility] ENDOSCOPY;  Service: Endoscopy;;   COLONOSCOPY  2014   ESOPHAGOGASTRODUODENOSCOPY (EGD) WITH PROPOFOL  N/A 07/11/2018   Procedure: ESOPHAGOGASTRODUODENOSCOPY (EGD) WITH PROPOFOL ;  Surgeon: Aneita Gwendlyn DASEN, MD;  Location: [facility] ENDOSCOPY;  Service: Endoscopy;  Laterality: N/A;   GASTRIC BYPASS  2007   HEMOSTASIS CLIP PLACEMENT  07/11/2018   Procedure: HEMOSTASIS CLIP PLACEMENT;  Surgeon: Aneita Gwendlyn DASEN, MD;  Location: [facility] ENDOSCOPY;  Service: Endoscopy;;   LAPAROTOMY N/A 03/08/2018   Procedure: EXPLORATORY LAPAROTOMY, REPAIR AND PATCH CLOSURE OF PERFORATED ULCER;  Surgeon: Sebastian Moles, MD;  Location: [facility] OR;  Service: General;  Laterality: N/A;   TONSILLECTOMY     and adenoids removed as a child   TOTAL KNEE ARTHROPLASTY Right 12/12/2022   Procedure: RIGHT TOTAL KNEE ARTHROPLASTY;  Surgeon: Vernetta Lonni GRADE, MD;  Location: [facility] OR;  Service: Orthopedics;  Laterality: Right;   TUBAL LIGATION      Family Psychiatric History: I have reviewed family psychiatric history from progress note on  04/04/2021.  Family History:  Family History  Problem Relation Age of Onset   Stroke Mother    Alzheimer's disease Mother    Hypertension Father    Cancer Father        Prostate   Dementia Father    Alcohol abuse Sister    Depression Sister    Alcohol abuse Brother    Depression Brother    Diabetes Maternal Grandmother    Alzheimer's disease Maternal Grandmother    Diabetes Maternal Grandfather    Liver disease Maternal Grandfather    Cancer Maternal Grandfather        Prostate   Diabetes Paternal Grandmother    Alzheimer's disease Paternal Grandmother    Diabetes Paternal Grandfather    Cancer Paternal Grandfather        Lung, Prostate   Colon cancer Neg Hx    Esophageal cancer Neg Hx    Pancreatic cancer Neg Hx    Stomach cancer Neg Hx    Colon polyps Neg Hx    Rectal cancer Neg Hx     Social History: I have reviewed social history from progress note on 04/04/2021. Social History   Socioeconomic History   Marital status: Married    Spouse name: mark   Number of  children: 0   Years of education: Not on file   Highest education level: High school graduate  Occupational History    Employer: [employer]    Comment: [employer]  Tobacco Use   Smoking status: Former    Current packs/day: 0.00    Types: Cigarettes    Quit date: 01/24/1987    Years since quitting: 36.1   Smokeless tobacco: Never  Vaping Use   Vaping status: Former   Substances: Flavoring   Devices: has vaped with flavor but no nicotine. Been 8 years  Substance and Sexual Activity   Alcohol use: Not Currently    Comment: pt has not drank alcohol since 02/2020   Drug use: No   Sexual activity: Not Currently  Other Topics Concern   Not on file  Social History Narrative   Regular exercise--no (because of knees)   Social Drivers of Health   Financial Resource Strain: High Risk (11/07/2022)   Overall Financial Resource Strain (CARDIA)    Difficulty of Paying Living Expenses: Very hard  Food  Insecurity: Food Insecurity Present (12/12/2022)   Hunger Vital Sign    Worried About Running Out of Food in the Last Year: Sometimes true    Ran Out of Food in the Last Year: Sometimes true  Transportation Needs: No Transportation Needs (12/12/2022)   PRAPARE - Administrator, Civil Service (Medical): No    Lack of Transportation (Non-Medical): No  Physical Activity: Not on file  Stress: Not on file  Social Connections: Moderately Integrated (11/07/2022)   Social Connection and Isolation Panel [NHANES]    Frequency of Communication with Friends and Family: More than three times a week    Frequency of Social Gatherings with Friends and Family: More than three times a week    Attends Religious Services: More than 4 times per year    Active Member of [employer] or Organizations: No    Attends Banker Meetings: Never    Marital Status: Married    Allergies:  Allergies  Allergen Reactions   Lamictal  [Lamotrigine ] Rash   Penicillins Other (See Comments)    Did it involve swelling of the face/tongue/throat, SOB, or low BP? Unk Did it involve sudden or severe rash/hives, skin peeling, or any reaction on the inside of your mouth or nose? Unk Did you need to seek medical attention at a hospital or doctor's office? Unk When did it last happen? Was 58 years old; reaction not recalled     If all above answers are NO, may proceed with cephalosporin use.    Codeine  Itching    Crazy dreams and itching-pt said she can take it with food    Metabolic Disorder Labs: Lab Results  Component Value Date   HGBA1C 5.3 05/24/2022   MPG 105.41 05/24/2022   Lab Results  Component Value Date   PROLACTIN 16.9 05/24/2022   Lab Results  Component Value Date   CHOL 174 04/07/2022   TRIG 69 04/07/2022   HDL 66 04/07/2022   CHOLHDL 2.6 04/07/2022   VLDL 9.0 07/19/2021   LDLCALC 92 04/07/2022   LDLCALC 70 07/19/2021   Lab Results  Component Value Date   TSH 3.15 08/04/2022    TSH 1.87 04/07/2022    Therapeutic Level Labs: No results found for: LITHIUM No results found for: VALPROATE No results found for: CBMZ  Current Medications: Current Outpatient Medications  Medication Sig Dispense Refill   acetaminophen  (TYLENOL ) 650 MG CR tablet Take 1,300 mg by mouth  every 8 (eight) hours as needed (pain).     busPIRone  (BUSPAR ) 15 MG tablet Take 1 tablet by mouth 2 (two) times daily. Take daily at 9 AM and at 4 PM 180 tablet 1   Cholecalciferol  (VITAMIN D3) 50 MCG (2000 UT) capsule Take 1 capsule (2,000 Units total) by mouth daily. 100 capsule 0   cyanocobalamin  (VITAMIN B12) 1000 MCG/ML injection Inject 1 mL (1,000 mcg total) into the muscle every 30 (thirty) days. 10 mL 0   cyclobenzaprine  (FLEXERIL ) 10 MG tablet Take 1 tablet (10 mg total) by mouth 3 (three) times daily as needed. for muscle spams 30 tablet 2   Diclofenac  Sodium 2 % SOLN Apply 1 pump twice daily as needed. 112 g 1   diphenoxylate -atropine  (LOMOTIL ) 2.5-0.025 MG tablet Take 1 tablet by mouth 4 (four) times daily as needed for diarrhea or loose stools. 90 tablet 1   estradiol  (DOTTI ) 0.1 MG/24HR patch Place 1 patch (0.1 mg total) onto the skin 2 (two) times a week. 8 patch 2   estradiol  (ESTRACE ) 0.1 MG/GM vaginal cream Apply a pea-sized amount of cream to the fingertip and wipe in the front part of the vagina twice weekly 42.5 g 0   fluticasone  (FLONASE ) 50 MCG/ACT nasal spray Place 2 sprays into both nostrils daily. (Patient taking differently: Place 2 sprays into both nostrils daily as needed for allergies.) 16 g 5   furosemide  (LASIX ) 40 MG tablet Take 1 tablet (40 mg total) by mouth daily. 90 tablet 1   Homeopathic Products (ALLERGY MEDICINE PO) Take 1 tablet by mouth daily as needed (Takes the Clortabs. Equate Brand for allergies).     omeprazole  (PRILOSEC) 40 MG capsule Take 1 capsule (40 mg total) by mouth 2 (two) times daily. OPEN CAPSULE AND SPRINKLE ON APPLESAUCE. 60 capsule 11   OVER THE  COUNTER MEDICATION Take 2 capsules by mouth daily. Mushroom Complex     Probiotic Product (ALIGN) CHEW Chew 2 tablets by mouth daily. Align pre and probiotic with vitamain C-take one tablet each Align pre and probiotic with Vitamin b12 for immune and energy-take one tablet each     QUEtiapine  (SEROQUEL ) 25 MG tablet Take 3 tablets (75 mg total) by mouth at bedtime. Dose change, stop the 50 mg 90 tablet 1   rOPINIRole  (REQUIP ) 1 MG tablet Take 1 tablet (1 mg total) by mouth at bedtime. 90 tablet 1   sertraline  (ZOLOFT ) 100 MG tablet Take 2 tablets (200 mg total) by mouth daily. 180 tablet 1   SYRINGE-NEEDLE, DISP, 3 ML (B-D 3CC LUER-LOK SYR 25GX5/8) 25G X 5/8 3 ML MISC USE AS DIRECTED WITH B-12 INJECTION 16 each 2   hydrOXYzine  (VISTARIL ) 25 MG capsule Take 1 capsule (25 mg total) by mouth 2 (two) times daily as needed for anxiety. 60 capsule 1   No current facility-administered medications for this visit.     Musculoskeletal: Strength & Muscle Tone:  UTA Gait & Station:  Seated Patient leans: N/A  Psychiatric Specialty Exam: Review of Systems  Psychiatric/Behavioral:  Positive for sleep disturbance. The patient is nervous/anxious.     There were no vitals taken for this visit.There is no height or weight on file to calculate BMI.  General Appearance: Fairly Groomed  Eye Contact:  Fair  Speech:  Normal Rate  Volume:  Normal  Mood:  Anxious  Affect:  Appropriate  Thought Process:  Goal Directed and Descriptions of Associations: Intact  Orientation:  Full (Time, Place, and Person)  Thought Content:  Logical   Suicidal Thoughts:  No  Homicidal Thoughts:  No  Memory:  Immediate;   Fair Recent;   Fair Remote;   Fair  Judgement:  Fair  Insight:  Fair  Psychomotor Activity:  Normal  Concentration:  Concentration: Fair and Attention Span: Fair  Recall:  [employer] of Knowledge: Fair  Language: Fair  Akathisia:  No  Handed:  Right  AIMS (if indicated): not done  Assets:  Desire  for Improvement Housing Social Support Transportation  ADL's:  Intact  Cognition: WNL  Sleep:  Poor   Screenings: Geneticist, Molecular Office Visit from 11/01/2022 in [facility] Office Visit from 03/03/2022 in [facility] Office Visit from 01/20/2022 in [facility] Visit from 11/17/2021 in [facility] Visit from 06/13/2021 in [facility]  AIMS Total Score 0 0 0 0 0      GAD-7    Flowsheet Row Counselor from 11/07/2022 in [facility] Office Visit from 11/01/2022 in [facility] Office Visit from 04/07/2022 in [facility] Office Visit from 01/20/2022 in [facility] Office Visit from 11/17/2021 in [facility]  Total GAD-7 Score 21 20 18 19 20       PHQ2-9    Flowsheet Row Counselor from 11/07/2022 in [facility] Office Visit from 11/01/2022 in [facility] Video Visit from 08/21/2022 in [facility] Video Visit from 07/04/2022 in [facility] Office Visit from 04/07/2022 in [facility]  PHQ-2 Total Score 6 5 1  0 5  PHQ-9 Total Score 20 18 -- -- 19      Flowsheet Row Video Visit from 02/27/2023 in [facility] Video Visit from 01/12/2023 in [facility] Admission (Discharged) from 12/12/2022 in [facility]  C-SSRS RISK CATEGORY Moderate Risk Moderate Risk No Risk         Assessment and Plan: [name] is a 58 year old Caucasian female who has a history of PTSD, MDD, social anxiety, alcoholism, currently in remission, chronic pain was evaluated by telemedicine today.  Patient is currently improving, discussed assessment and plan as noted below.     Major Depressive Disorder-improving Ongoing symptoms of low mood, feelings of purposelessness, and occasional suicidal thoughts. On Seroquel  and Zoloft  with some improvement. No recent suicidal ideation. Encouraged use of support system and therapy. - Increase Seroquel  to 75 mg at bedtime - Continue Zoloft  200 mg daily - Continue Buspirone  15 mg twice a day. - Follow up with therapist regularly-Ms. Evalene Husband - Encourage use of support system and therapy  Post-Traumatic Stress Disorder (PTSD)-improving Symptoms of anxiety, depression, and nightmares. Undergoing EMDR therapy with some improvement but continues to experience intrusive thoughts and nightmares. Discussed potential for increased nightmares during EMDR and importance of relaxation techniques. - Continue EMDR therapy - Continue Hydroxyzine  25 mg twice a day as needed for anxiety - Continue sertraline  200 mg daily - Monitor for worsening symptoms during EMDR - Discuss potential for increased nightmares during EMDR  Social anxiety disorder-improving Patient is currently working on anxiety/social interactions with therapist. - Continue CBT with  Ms. Lanell - Continue hydroxyzine  25 mg twice a day as needed.  Insomnia/RLS-unstable Likely secondary to chronic pain and discomfort. Difficulty sleeping due to pain and nightmares. Using Ambien  for sleep and Seroquel  for mood stabilization and racing thoughts. Discussed increasing Seroquel  to 75 mg at bedtime and potential side effects, including need for EKG monitoring. - Increase Seroquel  to 75 mg at bedtime - Continue ropinirole  1 mg at bedtime - Order EKG one week after starting  higher dosage of Seroquel  - Continue Ambien  5 mg as needed, uses it rarely. - Educate on non-pharmacological sleep hygiene techniques  Alcohol use disorder in remission Patient has been sober since February 2022.  At risk for prolonged QT syndrome-we will order EKG.  Patient to get EKG completed by calling [phone] send Seroquel  dosage has been increased.   Follow-up - Schedule follow-up appointment on March 3 at 1:20 PM by video - Get EKG one week after starting higher dosage of Seroquel  - Follow up with therapist regularly.  Collaboration of Care: Collaboration of Care: Referral or follow-up with counselor/therapist AEB patient encouraged to continue CBT/EMDR  Patient/Guardian was advised Release of Information must be obtained prior to any record release in order to collaborate their care with an outside provider. Patient/Guardian was advised if they have not already done so to contact the registration department to sign all necessary forms in order for us  to release information regarding their care.   Consent: Patient/Guardian gives verbal consent for treatment and assignment of benefits for services provided during this visit. Patient/Guardian expressed understanding and agreed to proceed.   This note was generated in part or whole with voice recognition software. Voice recognition is usually quite accurate but there are transcription errors that can and very often do occur. I apologize for any typographical errors that were not detected and corrected.    Richardo Popoff, MD 03/01/2023, 9:55 AM

## 2023-02-28 ENCOUNTER — Ambulatory Visit (INDEPENDENT_AMBULATORY_CARE_PROVIDER_SITE_OTHER): Payer: Medicaid Other | Admitting: Licensed Clinical Social Worker

## 2023-02-28 DIAGNOSIS — F3341 Major depressive disorder, recurrent, in partial remission: Secondary | ICD-10-CM | POA: Diagnosis not present

## 2023-02-28 DIAGNOSIS — F4310 Post-traumatic stress disorder, unspecified: Secondary | ICD-10-CM

## 2023-02-28 DIAGNOSIS — F4010 Social phobia, unspecified: Secondary | ICD-10-CM | POA: Diagnosis not present

## 2023-02-28 NOTE — Progress Notes (Signed)
 THERAPIST PROGRESS NOTE  Session Time: 11-11:50am  Participation Level: Active  Behavioral Response: CasualAlertEuthymic  Type of Therapy: Individual Therapy  Treatment Goals addressed:      Goal: STG: Reduce the negative impact trauma related symptoms have on social, occupational, and family functioning per pt self report 3 out of 5 sessions documented.                          Goal: LTG: Reduction in intrusive event recollections, avoidance of event reminders, intense arousal, or disinterest in activities or relationships  as evidenced by pt self report 3 out of 5 sessions documented.                          Goal: STG: [name] will identify internal and external stimuli that trigger PTSD symptoms  as evidenced by pt self report 3 out of 5 sessions documented.                             Goal: STG: [name] will acknowledge that healing from PTSD is a gradual process  as evidenced by pt self report 3 out of 5 sessions documented.                         Goal: STG: [name] will identify negative coping strategies that have been used to cope with the feelings associated with the trauma  as evidenced by pt self report 3 out of 5 sessions documented.        ProgressTowards Goals: Progressing  Interventions: Supportive and Other: EMDR  Summary: [name] is a 58 y.o. female who presents with symptoms of anxiety, depression, trauma.  Patient identifies symptoms to include intrusive thoughts, reexperiencing, tearfulness, low mood, fatigue, difficulty sleeping, anxiety in large crowds. Pt was oriented times 5. Pt was cooperative and engaged. Pt denies SI/HI/AVH.     Patient continues to report disrupted sleep due to pain in her hip.  Patient reflected on recent med management appointment with her psychiatrist and an increase in medication to assist in sleep.  Patient and clinician continue to work through EMDR.  Patient worked with clinician in identifying  negative cognitions that she would like to address through EMDR identifying the thought I am damaged and I am unworthy.  Patient identified she would like to reframe this thoughts to I am worthy.  Processed series of past events that have instilled these beliefs.  Constructed target sequence plan will be worked through and addressed in future EMDR sessions.  Close out the session by visiting patient's peaceful place to assist in returning to baseline.  Suicidal/Homicidal: No Without Plan/Intent  Therapist Response: Cln utilized active listening to create a safe space for patient to process recent life events.  Assess for safety, stressors, symptoms since last session.  Utilized EMDR to continue to work through trauma memories and reframe negative cognitions.  Plan: Return again in 1 week.  Diagnosis: PTSD (post-traumatic stress disorder)  Recurrent major depressive disorder, in partial remission (HCC)  Social anxiety disorder   Collaboration of Care: AEB psychiatrist can access notes and cln. Will review psychiatrists' notes. Check in with the patient and will see LCSW per availability. Patient agreed with treatment recommendations.   Patient/Guardian was advised Release of Information must be obtained prior to any record release in order to collaborate their care with an  outside provider. Patient/Guardian was advised if they have not already done so to contact the registration department to sign all necessary forms in order for us  to release information regarding their care.   Consent: Patient/Guardian gives verbal consent for treatment and assignment of benefits for services provided during this visit. Patient/Guardian expressed understanding and agreed to proceed.   Mary KATHEE Husband, LCSW 02/28/2023

## 2023-03-04 ENCOUNTER — Ambulatory Visit
Admission: RE | Admit: 2023-03-04 | Discharge: 2023-03-04 | Disposition: A | Discharge status: Home / Self Care | Payer: Medicaid Other | Source: Ambulatory Visit | Attending: Orthopaedic Surgery | Admitting: Orthopaedic Surgery

## 2023-03-04 DIAGNOSIS — M7061 Trochanteric bursitis, right hip: Secondary | ICD-10-CM

## 2023-03-04 DIAGNOSIS — M25551 Pain in right hip: Secondary | ICD-10-CM

## 2023-03-05 ENCOUNTER — Ambulatory Visit (INDEPENDENT_AMBULATORY_CARE_PROVIDER_SITE_OTHER): Payer: Medicaid Other | Admitting: Licensed Clinical Social Worker

## 2023-03-05 DIAGNOSIS — F4010 Social phobia, unspecified: Secondary | ICD-10-CM | POA: Diagnosis not present

## 2023-03-05 DIAGNOSIS — F4310 Post-traumatic stress disorder, unspecified: Secondary | ICD-10-CM

## 2023-03-05 DIAGNOSIS — F3341 Major depressive disorder, recurrent, in partial remission: Secondary | ICD-10-CM | POA: Diagnosis not present

## 2023-03-05 NOTE — Progress Notes (Signed)
 THERAPIST PROGRESS NOTE  Session Time: 10-10:41 am  Participation Level: Active  Behavioral Response: CasualAlertEuthymic  Type of Therapy: Individual Therapy  Treatment Goals addressed:         Goal: STG: Reduce the negative impact trauma related symptoms have on social, occupational, and family functioning per pt self report 3 out of 5 sessions documented.                                     Goal: LTG: Reduction in intrusive event recollections, avoidance of event reminders, intense arousal, or disinterest in activities or relationships  as evidenced by pt self report 3 out of 5 sessions documented.                                     Goal: STG: [name] will identify internal and external stimuli that trigger PTSD symptoms  as evidenced by pt self report 3 out of 5 sessions documented.                                     Goal: STG: [name] will acknowledge that healing from PTSD is a gradual process  as evidenced by pt self report 3 out of 5 sessions documented.                                     Goal: STG: [name] will identify negative coping strategies that have been used to cope with the feelings associated with the trauma  as evidenced by pt self report 3 out of 5 sessions documented.        ProgressTowards Goals: Progressing  Interventions: Supportive and Other: EMDR  Summary: [name] is a 58 y.o. female who presents with symptoms of anxiety, depression, trauma.  Patient identifies symptoms to include intrusive thoughts, reexperiencing, tearfulness, low mood, fatigue, difficulty sleeping, anxiety in large crowds. Pt was oriented times 5. Pt was cooperative and engaged. Pt denies SI/HI/AVH.     Patient utilized therapeutic space to process recent incident during an MRI where patient experienced trauma symptoms as a result of feeling bound and trapped.  Patient reflected on ability to regulate her symptoms through use of her peaceful place and safety  mantras.  Clinician continued EMDR with patient processing [place] memory on her target sequence plan.  Patient worked to reframe thought "I am alone "to "I am loved."  Patient identified her level of distress decreased from 10-0 and truthfulness of positive adaptive belief increased from 3-7 by the end of session.  Suicidal/Homicidal: Nowithout intent/plan  Therapist Response: Cln utilized active listening to create a safe space for patient to process recent life events.  Assess for safety, stressors, symptoms since last session.  Utilized EMDR to continue to work through trauma memories and reframe negative cognitions.   Plan: Return again in 1 week.  Diagnosis: PTSD (post-traumatic stress disorder)  Recurrent major depressive disorder, in partial remission (HCC)  Social anxiety disorder   Collaboration of Care: AEB psychiatrist can access notes and cln. Will review psychiatrists' notes. Check in with the patient and will see LCSW per availability. Patient agreed with treatment recommendations.   Patient/Guardian was advised Release of Information must  be obtained prior to any record release in order to collaborate their care with an outside provider. Patient/Guardian was advised if they have not already done so to contact the registration department to sign all necessary forms in order for us  to release information regarding their care.   Consent: Patient/Guardian gives verbal consent for treatment and assignment of benefits for services provided during this visit. Patient/Guardian expressed understanding and agreed to proceed.   Marvin Slot, LCSW 03/05/2023

## 2023-03-11 ENCOUNTER — Other Ambulatory Visit: Payer: Self-pay | Admitting: Family Medicine

## 2023-03-11 DIAGNOSIS — E538 Deficiency of other specified B group vitamins: Secondary | ICD-10-CM

## 2023-03-12 ENCOUNTER — Other Ambulatory Visit: Payer: Self-pay

## 2023-03-12 ENCOUNTER — Ambulatory Visit: Payer: Self-pay | Admitting: Licensed Clinical Social Worker

## 2023-03-12 MED ORDER — CYANOCOBALAMIN 1000 MCG/ML IJ SOLN
1000.0000 ug | INTRAMUSCULAR | 0 refills | Status: AC
  Filled 2023-03-12: qty 3, 90d supply, fill #0
  Filled 2023-06-15: qty 3, 90d supply, fill #1
  Filled 2023-09-24: qty 3, 90d supply, fill #2
  Filled 2024-01-10: qty 1, 30d supply, fill #3

## 2023-03-19 ENCOUNTER — Ambulatory Visit
Admission: RE | Admit: 2023-03-19 | Discharge: 2023-03-19 | Disposition: A | Discharge status: Home / Self Care | Payer: Medicaid Other | Attending: Psychiatry | Admitting: Psychiatry

## 2023-03-19 ENCOUNTER — Encounter: Payer: Self-pay | Admitting: Orthopaedic Surgery

## 2023-03-19 ENCOUNTER — Ambulatory Visit (INDEPENDENT_AMBULATORY_CARE_PROVIDER_SITE_OTHER): Payer: Medicaid Other | Admitting: Orthopaedic Surgery

## 2023-03-19 DIAGNOSIS — Z5181 Encounter for therapeutic drug level monitoring: Secondary | ICD-10-CM | POA: Diagnosis not present

## 2023-03-19 DIAGNOSIS — M25551 Pain in right hip: Secondary | ICD-10-CM

## 2023-03-19 DIAGNOSIS — Z9189 Other specified personal risk factors, not elsewhere classified: Secondary | ICD-10-CM | POA: Diagnosis present

## 2023-03-19 NOTE — Progress Notes (Signed)
 The patient is someone who comes in today to go over MRI of her right hip.  She has been having locking catching and pain in the groin.  We have replaced her right knee just in November this past year and that is done well.  Her right hip moves smoothly and fluidly but has some worsening pain in the groin.  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  Again there is no blocks to rotation of her hip and it no smoothly and fluidly with pain in the groin.  I did go over the MRI findings with her and showed her a hip model describing what she has going on.  She is amenable to trying a steroid injection in her right hip joint under ultrasound.  Will see if we get that set up soon.  I would then see her back in a month to see how she is doing overall.  She agrees with the treatment plan.

## 2023-03-22 ENCOUNTER — Other Ambulatory Visit: Payer: Self-pay | Admitting: Family Medicine

## 2023-03-22 ENCOUNTER — Other Ambulatory Visit: Payer: Self-pay

## 2023-03-22 ENCOUNTER — Ambulatory Visit (INDEPENDENT_AMBULATORY_CARE_PROVIDER_SITE_OTHER): Payer: Medicaid Other | Admitting: Licensed Clinical Social Worker

## 2023-03-22 DIAGNOSIS — F4310 Post-traumatic stress disorder, unspecified: Secondary | ICD-10-CM

## 2023-03-22 DIAGNOSIS — F4010 Social phobia, unspecified: Secondary | ICD-10-CM | POA: Diagnosis not present

## 2023-03-22 DIAGNOSIS — F3341 Major depressive disorder, recurrent, in partial remission: Secondary | ICD-10-CM

## 2023-03-22 MED FILL — Fluticasone Propionate Nasal Susp 50 MCG/ACT: NASAL | 30 days supply | Qty: 16 | Fill #4 | Status: AC

## 2023-03-22 NOTE — Progress Notes (Signed)
 THERAPIST PROGRESS NOTE  Session Time: 10:03am-10:54am  Participation Level: Active  Behavioral Response: CasualAlertEuthymic  Type of Therapy: Individual Therapy  Treatment Goals addressed:  Goal: STG: Reduce the negative impact trauma related symptoms have on social, occupational, and family functioning per pt self report 3 out of 5 sessions documented.                                      Goal: LTG: Reduction in intrusive event recollections, avoidance of event reminders, intense arousal, or disinterest in activities or relationships  as evidenced by pt self report 3 out of 5 sessions documented.                                     Goal: STG: Quenisha will identify internal and external stimuli that trigger PTSD symptoms  as evidenced by pt self report 3 out of 5 sessions documented.                                     Goal: STG: Selenia will acknowledge that healing from PTSD is a gradual process  as evidenced by pt self report 3 out of 5 sessions documented.                                     Goal: STG: Tangelia will identify negative coping strategies that have been used to cope with the feelings associated with the trauma  as evidenced by pt self report 3 out of 5 sessions documented.        ProgressTowards Goals: Progressing  Interventions: Supportive and Other: EMDR  Summary: [name] is a 58 y.o. female who presents with symptoms of anxiety, depression, trauma. Patient identifies symptoms to include intrusive thoughts, reexperiencing, tearfulness, low mood, fatigue, difficulty sleeping, anxiety in large crowds. Pt was oriented times 5. Pt was cooperative and engaged. Pt denies SI/HI/AVH.   Patient utilized therapeutic space to process anxiety around application for disability.  Patient reflected on recent psychological evaluation.  Clinician and patient worked to review controllable versus uncontrollable factors.  The clinician continued Eye  Movement Desensitization and Reprocessing (EMDR) therapy with the patient, who was processing a recent triggered memory related to an upcoming family outing. The patient became distressed while reflecting on this memory and identified the thought, "I am not believable," as a trigger. Worked through processing to desensitize the distress associated with this memory, achieving a decrease in distress from a level of 10 to 5. The patient concluded the session by visiting her peaceful place and plans to continue processing this memory in future sessions. The clinician and the patient will work towards instilling the positive adaptive belief: "I can learn to believe in myself."  Suicidal/Homicidal: Nowithout intent/plan  Therapist Response: Cln utilized active listening to create a safe space for patient to process recent life events. Assess for safety, stressors, symptoms since last session. Utilized EMDR to continue to work through trauma memories and reframe negative cognitions.   Plan: Return again in 1 week.  Diagnosis: PTSD (post-traumatic stress disorder)  Recurrent major depressive disorder, in partial remission (HCC)  Social anxiety disorder   Collaboration of Care: AEB psychiatrist  can access notes and cln. Will review psychiatrists' notes. Check in with the patient and will see LCSW per availability. Patient agreed with treatment recommendations.   Patient/Guardian was advised Release of Information must be obtained prior to any record release in order to collaborate their care with an outside provider. Patient/Guardian was advised if they have not already done so to contact the registration department to sign all necessary forms in order for [place] to release information regarding their care.   Consent: Patient/Guardian gives verbal consent for treatment and assignment of benefits for services provided during this visit. Patient/Guardian expressed understanding and agreed to proceed.    Dereck Leep, LCSW 03/22/2023

## 2023-03-23 ENCOUNTER — Other Ambulatory Visit: Payer: Self-pay

## 2023-03-23 ENCOUNTER — Telehealth: Payer: Self-pay | Admitting: Psychiatry

## 2023-03-23 NOTE — Telephone Encounter (Signed)
 Please notify the patient that the EKG showed no significant abnormalities and advise her to continue taking the medication as prescribed.

## 2023-03-26 ENCOUNTER — Other Ambulatory Visit: Payer: Self-pay | Admitting: Family Medicine

## 2023-03-26 ENCOUNTER — Other Ambulatory Visit: Payer: Self-pay

## 2023-03-26 ENCOUNTER — Encounter: Payer: Self-pay | Admitting: Psychiatry

## 2023-03-26 ENCOUNTER — Telehealth (INDEPENDENT_AMBULATORY_CARE_PROVIDER_SITE_OTHER): Payer: Medicaid Other | Admitting: Psychiatry

## 2023-03-26 DIAGNOSIS — G2581 Restless legs syndrome: Secondary | ICD-10-CM

## 2023-03-26 DIAGNOSIS — F3341 Major depressive disorder, recurrent, in partial remission: Secondary | ICD-10-CM | POA: Diagnosis not present

## 2023-03-26 DIAGNOSIS — F4310 Post-traumatic stress disorder, unspecified: Secondary | ICD-10-CM

## 2023-03-26 DIAGNOSIS — F4010 Social phobia, unspecified: Secondary | ICD-10-CM

## 2023-03-26 DIAGNOSIS — F1021 Alcohol dependence, in remission: Secondary | ICD-10-CM

## 2023-03-26 MED ORDER — SERTRALINE HCL 100 MG PO TABS
200.0000 mg | ORAL_TABLET | Freq: Every day | ORAL | 1 refills | Status: DC
  Filled 2023-03-26: qty 180, 90d supply, fill #0
  Filled 2023-06-25 – 2023-07-05 (×2): qty 180, 90d supply, fill #1

## 2023-03-26 MED ORDER — ESTRADIOL 0.1 MG/24HR TD PTTW
1.0000 | MEDICATED_PATCH | TRANSDERMAL | 0 refills | Status: DC
  Filled 2023-03-26: qty 8, 28d supply, fill #0

## 2023-03-26 NOTE — Progress Notes (Unsigned)
 Virtual Visit via Video Note  I connected with Leiliana Foody on 03/26/23 at  1:20 PM EST by a video enabled telemedicine application and verified that I am speaking with the correct person using two identifiers.  Location Provider Location : [facility] Patient Location : Home  Participants: Patient , Provider   I discussed the limitations of evaluation and management by telemedicine and the availability of in person appointments. The patient expressed understanding and agreed to proceed.   I discussed the assessment and treatment plan with the patient. The patient was provided an opportunity to ask questions and all were answered. The patient agreed with the plan and demonstrated an understanding of the instructions.   The patient was advised to call back or seek an in-person evaluation if the symptoms worsen or if the condition fails to improve as anticipated.  BH MD OP Progress Note  03/26/2023 1:38 PM Jahnya Trindade  MRN:  [id number]  Chief Complaint:  Chief Complaint  Patient presents with   Follow-up   Depression   Anxiety   Medication Refill   HPI: Joeann Steppe is a 58 year old Caucasian female, married, unemployed, has a history of PTSD, MDD, social anxiety disorder, RLS, alcohol use disorder in remission, chronic pain, [place] was evaluated by telemedicine today.  She experiences persistent anxiety and depression, influenced by various factors, leading to sadness and difficulty in public settings, such as needing to leave stores due to triggers. She has not experienced suicidal thoughts in about a month.  She is currently undergoing Eye Movement Desensitization and Reprocessing (EMDR) therapy with her therapist, Foye Clock, which she finds helpful, though she acknowledges there is still progress to be made.  Her current medications include Seroquel 75 mg, Zoloft, and Buspar. She notes an improvement in her sleep, waking up only two to three times a night compared to  every hour previously.  She appeared to be alert, oriented to person place time and situation.  3 word memory immediate 3 out of 3 today, after 5 minutes 3 out of 3.  Patient was able to do serial sevens well in session today.  Attention and focus seems to be improving.  In her social history, she is actively involved in caring for her father, who has been diagnosed with a condition. Her mother had dementia and Alzheimer's. She has been busy with multiple appointments, including her father's cataract surgery and other medical visits.  Visit Diagnosis:    ICD-10-CM   1. PTSD (post-traumatic stress disorder)  F43.10 sertraline (ZOLOFT) 100 MG tablet    2. Recurrent major depressive disorder, in partial remission (HCC)  F33.41 sertraline (ZOLOFT) 100 MG tablet    3. Social anxiety disorder  F40.10 sertraline (ZOLOFT) 100 MG tablet    4. RLS (restless legs syndrome)  G25.81     5. Alcohol use disorder, severe, in sustained remission (HCC)  F10.21       Past Psychiatric History: I have reviewed past psychiatric history from progress note on 04/04/2021.  Trials of medications like Wellbutrin, Abilify, Zoloft, Xanax, Cymbalta, Pristiq, BuSpar, Deplin, Lexapro, Valium, Viibryd, Trintellix, Lamictal.  Patient had psychological testing completed-05/05/2021-Dr. Rodenbough-does not meet criteria for ADHD.  Patient evaluated on 06/01/2022-patient recommended to continue psychiatric treatment for PTSD, depression, sleep.  Past Medical History:  Past Medical History:  Diagnosis Date   Allergy    Anemia    Anxiety    Arthritis    Asthma    Depression    GERD (gastroesophageal reflux disease)  Heart murmur    from childhood- pt had ECHO 12/27/21   Hiatal hernia    Hypertension    Peptic ulcer    PTSD (post-traumatic stress disorder)    UTI (urinary tract infection)     Past Surgical History:  Procedure Laterality Date   ABDOMINAL HYSTERECTOMY     fibroids   BIOPSY  07/11/2018   Procedure:  BIOPSY;  Surgeon: Meryl Dare, MD;  Location[facility] ENDOSCOPY;  Service: Endoscopy;;   COLONOSCOPY  2014   ESOPHAGOGASTRODUODENOSCOPY (EGD) WITH PROPOFOL N/A 07/11/2018   Procedure: ESOPHAGOGASTRODUODENOSCOPY (EGD) WITH PROPOFOL;  Surgeon: Meryl Dare, MD;  Location: [facility] ENDOSCOPY;  Service: Endoscopy;  Laterality: N/A;   GASTRIC BYPASS  2007   HEMOSTASIS CLIP PLACEMENT  07/11/2018   Procedure: HEMOSTASIS CLIP PLACEMENT;  Surgeon: Meryl Dare, MD;  Location: [facility] ENDOSCOPY;  Service: Endoscopy;;   LAPAROTOMY N/A 03/08/2018   Procedure: EXPLORATORY LAPAROTOMY, REPAIR AND PATCH CLOSURE OF PERFORATED ULCER;  Surgeon: Violeta Gelinas, MD;  Location: [facility] OR;  Service: General;  Laterality: N/A;   TONSILLECTOMY     and adenoids removed as a child   TOTAL KNEE ARTHROPLASTY Right 12/12/2022   Procedure: RIGHT TOTAL KNEE ARTHROPLASTY;  Surgeon: Kathryne Hitch, MD;  Location: [facility] OR;  Service: Orthopedics;  Laterality: Right;   TUBAL LIGATION      Family Psychiatric History: I have reviewed family psychiatric history from progress note on 04/04/2021.  Family History:  Family History  Problem Relation Age of Onset   Stroke Mother    Alzheimer's disease Mother    Hypertension Father    Cancer Father        Prostate   Dementia Father    Alcohol abuse Sister    Depression Sister    Alcohol abuse Brother    Depression Brother    Diabetes Maternal Grandmother    Alzheimer's disease Maternal Grandmother    Diabetes Maternal Grandfather    Liver disease Maternal Grandfather    Cancer Maternal Grandfather        Prostate   Diabetes Paternal Grandmother    Alzheimer's disease Paternal Grandmother    Diabetes Paternal Grandfather    Cancer Paternal Grandfather        Lung, Prostate   Colon cancer Neg Hx    Esophageal cancer Neg Hx    Pancreatic cancer Neg Hx    Stomach cancer Neg Hx    Colon polyps Neg Hx    Rectal cancer Neg Hx     Social History: I have reviewed social  history from progress note on 04/04/2021. Social History   Socioeconomic History   Marital status: Married    Spouse name: mark   Number of children: 0   Years of education: Not on file   Highest education level: High school graduate  Occupational History    Employer: [employer]    Comment: [employer]  Tobacco Use   Smoking status: Former    Current packs/day: 0.00    Types: Cigarettes    Quit date: 01/24/1987    Years since quitting: 36.1   Smokeless tobacco: Never  Vaping Use   Vaping status: Former   Substances: Flavoring   Devices: has vaped with flavor but no nicotine. Been 8 years  Substance and Sexual Activity   Alcohol use: Not Currently    Comment: pt has not drank alcohol since 02/2020   Drug use: No   Sexual activity: Not Currently  Other Topics Concern   Not on  file  Social History Narrative   Regular exercise--no (because of knees)   Social Drivers of Health   Financial Resource Strain: High Risk (11/07/2022)   Overall Financial Resource Strain (CARDIA)    Difficulty of Paying Living Expenses: Very hard  Food Insecurity: Food Insecurity Present (12/12/2022)   Hunger Vital Sign    Worried About Running Out of Food in the Last Year: Sometimes true    Ran Out of Food in the Last Year: Sometimes true  Transportation Needs: No Transportation Needs (12/12/2022)   PRAPARE - Administrator, Civil Service (Medical): No    Lack of Transportation (Non-Medical): No  Physical Activity: Not on file  Stress: Not on file  Social Connections: Moderately Integrated (11/07/2022)   Social Connection and Isolation Panel [NHANES]    Frequency of Communication with Friends and Family: More than three times a week    Frequency of Social Gatherings with Friends and Family: More than three times a week    Attends Religious Services: More than 4 times per year    Active Member of [employer] or Organizations: No    Attends Banker Meetings: Never    Marital  Status: Married    Allergies:  Allergies  Allergen Reactions   Lamictal [Lamotrigine] Rash   Penicillins Other (See Comments)    Did it involve swelling of the face/tongue/throat, SOB, or low BP? Unk Did it involve sudden or severe rash/hives, skin peeling, or any reaction on the inside of your mouth or nose? Unk Did you need to seek medical attention at a hospital or doctor's office? Unk When did it last happen? Was 58 years old; reaction not recalled     If all above answers are "NO", may proceed with cephalosporin use.    Codeine Itching    Crazy dreams and itching-pt said she can take it with food    Metabolic Disorder Labs: Lab Results  Component Value Date   HGBA1C 5.3 05/24/2022   MPG 105.41 05/24/2022   Lab Results  Component Value Date   PROLACTIN 16.9 05/24/2022   Lab Results  Component Value Date   CHOL 174 04/07/2022   TRIG 69 04/07/2022   HDL 66 04/07/2022   CHOLHDL 2.6 04/07/2022   VLDL 9.0 07/19/2021   LDLCALC 92 04/07/2022   LDLCALC 70 07/19/2021   Lab Results  Component Value Date   TSH 3.15 08/04/2022   TSH 1.87 04/07/2022    Therapeutic Level Labs: No results found for: "LITHIUM" No results found for: "VALPROATE" No results found for: "CBMZ"  Current Medications: Current Outpatient Medications  Medication Sig Dispense Refill   acetaminophen (TYLENOL) 650 MG CR tablet Take 1,300 mg by mouth every 8 (eight) hours as needed (pain).     busPIRone (BUSPAR) 15 MG tablet Take 1 tablet by mouth 2 (two) times daily. Take daily at 9 AM and at 4 PM 180 tablet 1   Cholecalciferol (VITAMIN D3) 50 MCG (2000 UT) capsule Take 1 capsule (2,000 Units total) by mouth daily. 100 capsule 0   cyanocobalamin (VITAMIN B12) 1000 MCG/ML injection Inject 1 mL (1,000 mcg total) into the muscle every 30 (thirty) days. 10 mL 0   cyclobenzaprine (FLEXERIL) 10 MG tablet Take 1 tablet (10 mg total) by mouth 3 (three) times daily as needed. for muscle spams 30 tablet 2    Diclofenac Sodium 2 % SOLN Apply 1 pump twice daily as needed. 112 g 1   diphenoxylate-atropine (LOMOTIL) 2.5-0.025 MG tablet Take  1 tablet by mouth 4 (four) times daily as needed for diarrhea or loose stools. 90 tablet 1   estradiol (DOTTI) 0.1 MG/24HR patch Place 1 patch (0.1 mg total) onto the skin 2 (two) times a week. 8 patch 0   estradiol (ESTRACE) 0.1 MG/GM vaginal cream Apply a pea-sized amount of cream to the fingertip and wipe in the front part of the vagina twice weekly 42.5 g 0   fluticasone (FLONASE) 50 MCG/ACT nasal spray Place 2 sprays into both nostrils daily. (Patient taking differently: Place 2 sprays into both nostrils daily as needed for allergies.) 16 g 5   furosemide (LASIX) 40 MG tablet Take 1 tablet (40 mg total) by mouth daily. 90 tablet 1   Homeopathic Products (ALLERGY MEDICINE PO) Take 1 tablet by mouth daily as needed (Takes the Clortabs. Equate Brand for allergies).     hydrOXYzine (VISTARIL) 25 MG capsule Take 1 capsule (25 mg total) by mouth 2 (two) times daily as needed for anxiety. 60 capsule 1   omeprazole (PRILOSEC) 40 MG capsule Take 1 capsule (40 mg total) by mouth 2 (two) times daily. OPEN CAPSULE AND SPRINKLE ON APPLESAUCE. 60 capsule 11   OVER THE COUNTER MEDICATION Take 2 capsules by mouth daily. Mushroom Complex     Probiotic Product (ALIGN) CHEW Chew 2 tablets by mouth daily. Align pre and probiotic with vitamain C-take one tablet each Align pre and probiotic with Vitamin b12 for immune and energy-take one tablet each     QUEtiapine (SEROQUEL) 25 MG tablet Take 3 tablets (75 mg total) by mouth at bedtime. Dose change, stop the 50 mg 90 tablet 1   rOPINIRole (REQUIP) 1 MG tablet Take 1 tablet (1 mg total) by mouth at bedtime. 90 tablet 1   sertraline (ZOLOFT) 100 MG tablet Take 2 tablets (200 mg total) by mouth daily. 180 tablet 1   SYRINGE-NEEDLE, DISP, 3 ML (B-D 3CC LUER-LOK SYR 25GX5/8") 25G X 5/8" 3 ML MISC USE AS DIRECTED WITH B-12 INJECTION 16 each 2    No current facility-administered medications for this visit.     Musculoskeletal: Strength & Muscle Tone:  UTA Gait & Station:  Seated Patient leans: N/A  Psychiatric Specialty Exam: Review of Systems  Psychiatric/Behavioral:  Positive for decreased concentration, dysphoric mood and sleep disturbance. The patient is nervous/anxious.     There were no vitals taken for this visit.There is no height or weight on file to calculate BMI.  General Appearance: Casual  Eye Contact:  Fair  Speech:  Clear and Coherent  Volume:  Normal  Mood:  Anxious and Depressed improving  Affect:  Congruent  Thought Process:  Goal Directed and Descriptions of Associations: Intact  Orientation:  Full (Time, Place, and Person)  Thought Content: Logical   Suicidal Thoughts:  No  Homicidal Thoughts:  No  Memory:  Immediate;   Fair Recent;   Fair Remote;   Fair  Judgement:  Fair  Insight:  Fair  Psychomotor Activity:  Normal  Concentration:  Concentration: Fair and Attention Span: Fair  Recall:  [employer] of Knowledge: Fair  Language: Fair  Akathisia:  No  Handed:  Right  AIMS (if indicated): not done  Assets:  Desire for Improvement Housing Social Support Transportation  ADL's:  Intact  Cognition: WNL  Sleep:   improving   Screenings: Geneticist, molecular Office Visit from 11/01/2022 in [facility] Office Visit from 03/03/2022 in [facility] Office Visit from 01/20/2022 in [facility] Office Visit from 11/17/2021 in [facility] Office Visit from 06/13/2021 in [facility]  AIMS Total Score 0 0 0 0 0      GAD-7    Flowsheet Row Counselor from 11/07/2022 in [facility] Office Visit from 11/01/2022 in [facility] Office Visit from 04/07/2022 in [facility] Office Visit from 01/20/2022 in [facility] Office Visit from 11/17/2021 in [facility]  Total GAD-7 Score 21 20 18 19 20       PHQ2-9    Flowsheet Row Counselor from 11/07/2022 in [facility] Office Visit from 11/01/2022 in [facility] Video Visit from 08/21/2022 in [facility] Video Visit from 07/04/2022 in [facility] Office Visit from 04/07/2022 in [facility]  PHQ-2 Total Score 6 5 1  0 5  PHQ-9 Total Score 20 18 -- -- 19      Flowsheet Row Video Visit from 03/26/2023 in [facility] Video Visit from 02/27/2023 in [facility] Video Visit from 01/12/2023 in [facility]  C-SSRS RISK CATEGORY Moderate Risk Moderate Risk Moderate Risk        Assessment and Plan: Daryle Boyington is a 58 year old Caucasian female who has a history of PTSD, MDD, social anxiety, alcohol use disorder in remission, chronic pain was evaluated by telemedicine today.  Discussed assessment and plan as noted below.  Major Depressive Disorder in partial remission Ongoing depressive symptoms with persistent sadness. Some improvement in sleep with current medications. No suicidal ideation in the past month. Appetite stable with intentional weight loss. Current medications: Seroquel, Zoloft, Buspar. EMDR therapy has been beneficial. Emphasized the importance of continuing therapy as medications alone may not address all contributing factors. - Continue Seroquel 75 mg ( EKG -03/19/2023 - NSR,[id number]) - Continue Zoloft 200 mg daily -  Continue Buspar 15 mg twice daily - Continue EMDR therapy with Ms.Kristina  Julien Girt. - Follow up in 6-7 weeks, in-office visit towards the end of April  PTSD-improving Ongoing PTSD symptoms with certain triggers although with improvement. - Continue Zoloft 200 mg daily - Continue Seroquel 75 mg at bedtime  Social anxiety disorder-improving Ongoing anxiety symptoms, particularly in public settings with specific triggers. Current medications: Seroquel, Zoloft, Buspar. Utilizing breathing techniques and coping strategies from therapy. Emphasized the importance of continuing therapy as medications alone may not address all contributing factors. - Continue Seroquel 75 mg  - Continue Zoloft 200 mg daily - Continue Buspar as prescribed.  RLS-improving Reports sleep is improving.  Currently compliant on current medication regimen. - Continue ropinirole 1 mg at bedtime - Continue Ambien 5 mg at bedtime as needed rarely uses it.  Alcohol use disorder in remission Patient has been sober since February 2022. - Continue to reassess in future sessions.   Follow-up Follow-up in clinic in 6 to 7 weeks in person towards the end of April.  Collaboration of Care: Collaboration of Care: Referral or follow-up with counselor/therapist AEB encouraged to continue psychotherapy sessions.  Has upcoming appointment.  Patient/Guardian was advised Release of Information must be obtained prior to any record release in order to collaborate  their care with an outside provider. Patient/Guardian was advised if they have not already done so to contact the registration department to sign all necessary forms in order for [place] to release information regarding their care.   Consent: Patient/Guardian gives verbal consent for treatment and assignment of benefits for services provided during this visit. Patient/Guardian expressed understanding and agreed to proceed.  This note was generated in part or whole with voice  recognition software. Voice recognition is usually quite accurate but there are transcription errors that can and very often do occur. I apologize for any typographical errors that were not detected and corrected.  Okay Discussed the use of a AI scribe software for clinical note transcription with the patient, who gave verbal consent to proceed.    Jomarie Longs, MD 03/27/2023, 9:29 AM

## 2023-03-26 NOTE — Telephone Encounter (Signed)
 Called patient as instructed by provider to inform that the EKG showed no abnormalities and advise to continue taking medications as prescibed patient voiced understanding

## 2023-03-29 ENCOUNTER — Ambulatory Visit (INDEPENDENT_AMBULATORY_CARE_PROVIDER_SITE_OTHER): Payer: Self-pay | Admitting: Licensed Clinical Social Worker

## 2023-03-29 DIAGNOSIS — F4310 Post-traumatic stress disorder, unspecified: Secondary | ICD-10-CM

## 2023-03-29 DIAGNOSIS — F3341 Major depressive disorder, recurrent, in partial remission: Secondary | ICD-10-CM | POA: Diagnosis not present

## 2023-03-29 DIAGNOSIS — F1021 Alcohol dependence, in remission: Secondary | ICD-10-CM

## 2023-03-29 DIAGNOSIS — F4010 Social phobia, unspecified: Secondary | ICD-10-CM

## 2023-03-29 NOTE — Progress Notes (Signed)
 THERAPIST PROGRESS NOTE  Session Time: 11:07am-12pm  Participation Level: Active  Behavioral Response: CasualAlertEuthymic  Type of Therapy: Individual Therapy  Treatment Goals addressed:  Goal: STG: Reduce the negative impact trauma related symptoms have on social, occupational, and family functioning per pt self report 3 out of 5 sessions documented.                                       Goal: LTG: Reduction in intrusive event recollections, avoidance of event reminders, intense arousal, or disinterest in activities or relationships  as evidenced by pt self report 3 out of 5 sessions documented.                                     Goal: STG: Sanae will identify internal and external stimuli that trigger PTSD symptoms  as evidenced by pt self report 3 out of 5 sessions documented.                                     Goal: STG: Elvin will acknowledge that healing from PTSD is a gradual process  as evidenced by pt self report 3 out of 5 sessions documented.                                     Goal: STG: Charnita will identify negative coping strategies that have been used to cope with the feelings associated with the trauma  as evidenced by pt self report 3 out of 5 sessions documented.        ProgressTowards Goals: Progressing  Interventions: Supportive and Other: EMDR  Summary: [name] is a 58 y.o. female who presents with symptoms of anxiety, depression, trauma. Patient identifies symptoms to include intrusive thoughts, reexperiencing, tearfulness, low mood, fatigue, difficulty sleeping, anxiety in large crowds. Pt was oriented times 5. Pt was cooperative and engaged. Pt denies SI/HI/AVH.   The patient utilized the therapeutic space to address anxiety related to her application for disability. Reflected on the upcoming physical evaluation, and together with the clinician, explored the factors that were within her control versus those that were  not.  The clinician continued with Eye Movement Desensitization and Reprocessing (EMDR) therapy to revisit memories associated with an upcoming family outing. The patient reported progress in processing these memories, noting that she had not had sufficient time to consider the upcoming trip. Worked to challenge the belief, "I am not believable," and proceeded with processing to desensitize the distress associated with this memory, successfully reducing the level of distress from 4.5 to 0. The patient adopted the positive affirmation: "I trust myself."  Suicidal/Homicidal: Nowithout intent/plan  Therapist Response: Cln utilized active listening to create a safe space for patient to process recent life events. Assess for safety, stressors, symptoms since last session. Utilized EMDR to continue to work through trauma memories and reframe negative cognitions.   Plan: Return again in 1 week.  Diagnosis: PTSD (post-traumatic stress disorder)  Recurrent major depressive disorder, in partial remission (HCC)  Social anxiety disorder  Alcohol use disorder, severe, in sustained remission (HCC)   Collaboration of Care: AEB psychiatrist can access notes and cln. Will review psychiatrists'  notes. Check in with the patient and will see LCSW per availability. Patient agreed with treatment recommendations.   Patient/Guardian was advised Release of Information must be obtained prior to any record release in order to collaborate their care with an outside provider. Patient/Guardian was advised if they have not already done so to contact the registration department to sign all necessary forms in order for [place] to release information regarding their care.   Consent: Patient/Guardian gives verbal consent for treatment and assignment of benefits for services provided during this visit. Patient/Guardian expressed understanding and agreed to proceed.   Dereck Leep, LCSW 03/29/2023

## 2023-04-02 ENCOUNTER — Encounter: Payer: Self-pay | Admitting: Sports Medicine

## 2023-04-02 ENCOUNTER — Ambulatory Visit (INDEPENDENT_AMBULATORY_CARE_PROVIDER_SITE_OTHER): Payer: Medicaid Other | Admitting: Sports Medicine

## 2023-04-02 ENCOUNTER — Other Ambulatory Visit: Payer: Self-pay

## 2023-04-02 DIAGNOSIS — M1611 Unilateral primary osteoarthritis, right hip: Secondary | ICD-10-CM

## 2023-04-02 DIAGNOSIS — M25551 Pain in right hip: Secondary | ICD-10-CM | POA: Diagnosis not present

## 2023-04-02 MED ORDER — LIDOCAINE HCL 1 % IJ SOLN
4.0000 mL | INTRAMUSCULAR | Status: AC | PRN
  Administered 2023-04-02: 4 mL

## 2023-04-02 MED ORDER — METHYLPREDNISOLONE ACETATE 40 MG/ML IJ SUSP
80.0000 mg | INTRAMUSCULAR | Status: AC | PRN
  Administered 2023-04-02: 80 mg via INTRA_ARTICULAR

## 2023-04-02 NOTE — Progress Notes (Signed)
   Procedure Note  Patient: [name]             Date of Birth: December 15, 1965           MRN: [id number]             Visit Date: 04/02/2023  Procedures: Visit Diagnoses:  1. Pain of right hip   2. Unilateral primary osteoarthritis, right hip    Large Joint Inj: R hip joint on 04/02/2023 8:49 AM Indications: pain Details: 22 G 3.5 in needle, ultrasound-guided anterior approach Medications: 4 mL lidocaine 1 %; 80 mg methylPREDNISolone acetate 40 MG/ML Outcome: tolerated well, no immediate complications  Procedure: US-guided intra-articular hip injection, Right After discussion on risks/benefits/indications and informed verbal consent was obtained, a timeout was performed. Patient was lying supine on exam table. The hip was cleaned with betadine and alcohol swabs. Then utilizing ultrasound guidance, the patient's femoral head and neck junction was identified and subsequently injected with 4:2 lidocaine:depomedrol via an in-plane approach with ultrasound visualization of the injectate administered into the hip joint. Patient tolerated procedure well without immediate complications.  Procedure, treatment alternatives, risks and benefits explained, specific risks discussed. Consent was given by the patient. Immediately prior to procedure a time out was called to verify the correct patient, procedure, equipment, support staff and site/side marked as required. Patient was prepped and draped in the usual sterile fashion.     - f/u with Dr. Magnus Ivan in one month; I am happy to see her as needed - post-injection protocol discussed  Madelyn Brunner, DO Primary Care Sports Medicine Physician  [facility] - Orthopedics  This note was dictated using Dragon naturally speaking software and may contain errors in syntax, spelling, or content which have not been identified prior to signing this note.

## 2023-04-04 ENCOUNTER — Ambulatory Visit: Payer: Medicaid Other | Admitting: Licensed Clinical Social Worker

## 2023-04-05 ENCOUNTER — Ambulatory Visit (INDEPENDENT_AMBULATORY_CARE_PROVIDER_SITE_OTHER): Payer: Medicaid Other | Admitting: Licensed Clinical Social Worker

## 2023-04-05 DIAGNOSIS — F4010 Social phobia, unspecified: Secondary | ICD-10-CM

## 2023-04-05 DIAGNOSIS — F3341 Major depressive disorder, recurrent, in partial remission: Secondary | ICD-10-CM | POA: Diagnosis not present

## 2023-04-05 DIAGNOSIS — F4310 Post-traumatic stress disorder, unspecified: Secondary | ICD-10-CM

## 2023-04-05 DIAGNOSIS — F1021 Alcohol dependence, in remission: Secondary | ICD-10-CM | POA: Diagnosis not present

## 2023-04-05 NOTE — Progress Notes (Signed)
 THERAPIST PROGRESS NOTE  Session Time: 1:02pm-2pm  Participation Level: Active  Behavioral Response: CasualAlertEuthymic  Type of Therapy: Individual Therapy  Treatment Goals addressed:      Goal: STG: Reduce the negative impact trauma related symptoms have on social, occupational, and family functioning per pt self report 3 out of 5 sessions documented.                                       Goal: LTG: Reduction in intrusive event recollections, avoidance of event reminders, intense arousal, or disinterest in activities or relationships  as evidenced by pt self report 3 out of 5 sessions documented.                                     Goal: STG: Yesennia will identify internal and external stimuli that trigger PTSD symptoms  as evidenced by pt self report 3 out of 5 sessions documented.                                     Goal: STG: Ladana will acknowledge that healing from PTSD is a gradual process  as evidenced by pt self report 3 out of 5 sessions documented.                                     Goal: STG: Kyarra will identify negative coping strategies that have been used to cope with the feelings associated with the trauma  as evidenced by pt self report 3 out of 5 sessions documented.        ProgressTowards Goals: Progressing  Interventions: Solution Focused and Supportive  Summary: Machele Deihl is a 58 y.o. female who presents with symptoms of anxiety, depression, trauma. Patient identifies symptoms to include intrusive thoughts, reexperiencing, tearfulness, low mood, fatigue, difficulty sleeping, anxiety in large crowds. Pt was oriented times 5. Pt was cooperative and engaged. Pt denies SI/HI/AVH.   Patient utilized majority of her therapeutic session to process recent triggering experiences undergoing medical evaluations.  Patient expressed concerns regarding lack of trauma informed care despite disclosure of PTSD at the beginning of her appointments.   Patient identified ways in which she utilize coping skills to help regulate her responses in order to get through her doctors appointments.  Clinician explored ways patient would feel comfortable in the future utilizing assertive communication to establish an understanding with the provider prior to an examination.  Patient continued to process life events leading to increased stress and anxiety.  Patient became tearful processing her husband's declining health and there limited financial availability to get him the care that he needs.  Patient also continues to express anxiety and uncertainty around the process applying for disability.  With clinician to identify controllable factors and reshift her perspective.  Patient identified she is looking forward to celebrating her birthday and spending time with family this weekend.  Identified ways in which patient can practice self-care by removing herself from stressful situations and from stressful individuals.    Suicidal/Homicidal: Nowithout intent/plan  Therapist Response: Cln utilized active listening to create a safe space for patient to process recent life events. Assessed for safety, stressors, symptoms since  last session.   Plan: Return again in 1 week.  Diagnosis: PTSD (post-traumatic stress disorder)  Recurrent major depressive disorder, in partial remission (HCC)  Social anxiety disorder  Alcohol use disorder, severe, in sustained remission (HCC)   Collaboration of Care: AEB psychiatrist can access notes and cln. Will review psychiatrists' notes. Check in with the patient and will see LCSW per availability. Patient agreed with treatment recommendations.   Patient/Guardian was advised Release of Information must be obtained prior to any record release in order to collaborate their care with an outside provider. Patient/Guardian was advised if they have not already done so to contact the registration department to sign all necessary  forms in order for [place] to release information regarding their care.   Consent: Patient/Guardian gives verbal consent for treatment and assignment of benefits for services provided during this visit. Patient/Guardian expressed understanding and agreed to proceed.   Dereck Leep, LCSW 04/05/2023

## 2023-04-10 ENCOUNTER — Ambulatory Visit (INDEPENDENT_AMBULATORY_CARE_PROVIDER_SITE_OTHER): Payer: Medicaid Other | Admitting: Licensed Clinical Social Worker

## 2023-04-10 DIAGNOSIS — F3341 Major depressive disorder, recurrent, in partial remission: Secondary | ICD-10-CM | POA: Diagnosis not present

## 2023-04-10 DIAGNOSIS — F4310 Post-traumatic stress disorder, unspecified: Secondary | ICD-10-CM

## 2023-04-10 DIAGNOSIS — F4010 Social phobia, unspecified: Secondary | ICD-10-CM

## 2023-04-10 NOTE — Progress Notes (Signed)
 THERAPIST PROGRESS NOTE  Session Time: 10-10:50am  Participation Level: Active  Behavioral Response: CasualAlertEuthymic  Type of Therapy: Individual Therapy  Treatment Goals addressed:      Goal: STG: Reduce the negative impact trauma related symptoms have on social, occupational, and family functioning per pt self report 3 out of 5 sessions documented.                                       Goal: LTG: Reduction in intrusive event recollections, avoidance of event reminders, intense arousal, or disinterest in activities or relationships  as evidenced by pt self report 3 out of 5 sessions documented.                                     Goal: STG: Tyresa will identify internal and external stimuli that trigger PTSD symptoms  as evidenced by pt self report 3 out of 5 sessions documented.                                     Goal: STG: Demi will acknowledge that healing from PTSD is a gradual process  as evidenced by pt self report 3 out of 5 sessions documented.                                     Goal: STG: Nandita will identify negative coping strategies that have been used to cope with the feelings associated with the trauma  as evidenced by pt self report 3 out of 5 sessions documented.        ProgressTowards Goals: Progressing  Interventions: Supportive and Other: EMDR  Summary:[name] is a 58 y.o. female who presents with symptoms of anxiety, depression, trauma. Patient identifies symptoms to include intrusive thoughts, reexperiencing, tearfulness, low mood, fatigue, difficulty sleeping, anxiety in large crowds. Pt was oriented times 5. Pt was cooperative and engaged. Pt denies SI/HI/AVH.   The clinician continued with Eye Movement Desensitization and Reprocessing (EMDR) therapy to revisit memories associated with her trauma hx from childhood. Worked to challenge the belief, "I am trapped," and proceeded with processing to desensitize the distress  associated with this memory, successfully reducing the level of distress from 9 to 3. The patient closed out the session by utilizing her container.   Patient identified the goal to visit [employer] after session.  Expressed some anxiety about the upcoming trip worked with clinician to challenge these thoughts.  Patient identified correlations to stressful experiences from her previous time working at [employer] and her current experiences with anxiety when visiting the store.  Clinician reviewed coping skills with patient and acknowledge ways in which she can reframe negative perspectives.   Suicidal/Homicidal: Nowithout intent/plan  Therapist Response: Cln utilized active listening to create a safe space for patient to process recent life events. Assess for safety, stressors, symptoms since last session. Utilized EMDR to continue to work through trauma memories and reframe negative cognitions.   Plan: Return again in 1 week.  Diagnosis: PTSD (post-traumatic stress disorder)  Recurrent major depressive disorder, in partial remission (HCC)  Social anxiety disorder   Collaboration of Care: AEB psychiatrist can access notes and cln. Will  review psychiatrists' notes. Check in with the patient and will see LCSW per availability. Patient agreed with treatment recommendations.   Patient/Guardian was advised Release of Information must be obtained prior to any record release in order to collaborate their care with an outside provider. Patient/Guardian was advised if they have not already done so to contact the registration department to sign all necessary forms in order for [place] to release information regarding their care.   Consent: Patient/Guardian gives verbal consent for treatment and assignment of benefits for services provided during this visit. Patient/Guardian expressed understanding and agreed to proceed.   Dereck Leep, LCSW 04/10/2023

## 2023-04-16 ENCOUNTER — Other Ambulatory Visit: Payer: Self-pay

## 2023-04-16 ENCOUNTER — Other Ambulatory Visit: Payer: Self-pay | Admitting: Family Medicine

## 2023-04-16 ENCOUNTER — Ambulatory Visit: Payer: Medicaid Other | Admitting: Orthopaedic Surgery

## 2023-04-16 DIAGNOSIS — G2581 Restless legs syndrome: Secondary | ICD-10-CM

## 2023-04-16 MED ORDER — ROPINIROLE HCL 1 MG PO TABS
1.0000 mg | ORAL_TABLET | Freq: Every day | ORAL | 1 refills | Status: DC
  Filled 2023-04-16: qty 90, 90d supply, fill #0
  Filled 2023-07-18: qty 90, 90d supply, fill #1

## 2023-04-17 ENCOUNTER — Ambulatory Visit (INDEPENDENT_AMBULATORY_CARE_PROVIDER_SITE_OTHER): Payer: Medicaid Other | Admitting: Licensed Clinical Social Worker

## 2023-04-17 DIAGNOSIS — F4310 Post-traumatic stress disorder, unspecified: Secondary | ICD-10-CM

## 2023-04-17 DIAGNOSIS — F4010 Social phobia, unspecified: Secondary | ICD-10-CM

## 2023-04-17 DIAGNOSIS — F3341 Major depressive disorder, recurrent, in partial remission: Secondary | ICD-10-CM | POA: Diagnosis not present

## 2023-04-17 NOTE — Progress Notes (Signed)
 THERAPIST PROGRESS NOTE  Session Time: 10:01am-10:53am  Participation Level: Active  Behavioral Response: CasualAlertEuthymic  Type of Therapy: Individual Therapy  Treatment Goals addressed:  Goal: STG: Reduce the negative impact trauma related symptoms have on social, occupational, and family functioning per pt self report 3 out of 5 sessions documented.                                       Goal: LTG: Reduction in intrusive event recollections, avoidance of event reminders, intense arousal, or disinterest in activities or relationships  as evidenced by pt self report 3 out of 5 sessions documented.                                     Goal: STG: [name] will identify internal and external stimuli that trigger PTSD symptoms  as evidenced by pt self report 3 out of 5 sessions documented.                                     Goal: STG: [name] will acknowledge that healing from PTSD is a gradual process  as evidenced by pt self report 3 out of 5 sessions documented.                                     Goal: STG: [name] will identify negative coping strategies that have been used to cope with the feelings associated with the trauma  as evidenced by pt self report 3 out of 5 sessions documented.           ProgressTowards Goals: Progressing  Interventions: Supportive and Other: EMDR  Summary: [name] is a 58 y.o. female who presents with symptoms of anxiety, depression, trauma. Patient identifies symptoms to include intrusive thoughts, reexperiencing, tearfulness, low mood, fatigue, difficulty sleeping, anxiety in large crowds. Pt was oriented times 5. Pt was cooperative and engaged. Pt denies SI/HI/AVH.   Began the session with the patient processing distress from supporting her husband as he recovers from surgery. She also addressed feelings of distress related to her application for disability.  The clinician continued with Eye Movement Desensitization and  Reprocessing (EMDR) therapy to revisit memories associated with her trauma hx from childhood. Worked to challenge the belief, "I am trapped," and proceeded with processing to desensitize the distress associated with this memory, successfully reducing the level of distress from 3 to .5. Instilled the positve belief "I am free. He can no longer hurt me." The patient closed out the session by utilizing her container.   Suicidal/Homicidal: Nowithout intent/plan  Therapist Response: Cln utilized active listening to create a safe space for patient to process recent life events. Assess for safety, stressors, symptoms since last session. Utilized EMDR to continue to work through trauma memories and reframe negative cognitions.   Plan: Return again in 2 weeks.  Diagnosis: PTSD (post-traumatic stress disorder)  Recurrent major depressive disorder, in partial remission (HCC)  Social anxiety disorder   Collaboration of Care: AEB psychiatrist can access notes and cln. Will review psychiatrists' notes. Check in with the patient and will see LCSW per availability. Patient agreed with treatment recommendations.   Patient/Guardian was advised  Release of Information must be obtained prior to any record release in order to collaborate their care with an outside provider. Patient/Guardian was advised if they have not already done so to contact the registration department to sign all necessary forms in order for [place] to release information regarding their care.   Consent: Patient/Guardian gives verbal consent for treatment and assignment of benefits for services provided during this visit. Patient/Guardian expressed understanding and agreed to proceed.   Dereck Leep, LCSW 04/17/2023

## 2023-04-22 ENCOUNTER — Other Ambulatory Visit: Payer: Self-pay | Admitting: Family Medicine

## 2023-04-22 ENCOUNTER — Other Ambulatory Visit: Payer: Self-pay | Admitting: Psychiatry

## 2023-04-22 DIAGNOSIS — F4310 Post-traumatic stress disorder, unspecified: Secondary | ICD-10-CM

## 2023-04-22 DIAGNOSIS — F3341 Major depressive disorder, recurrent, in partial remission: Secondary | ICD-10-CM

## 2023-04-22 MED ORDER — QUETIAPINE FUMARATE 25 MG PO TABS
75.0000 mg | ORAL_TABLET | Freq: Every day | ORAL | 1 refills | Status: DC
  Filled 2023-04-22: qty 90, 30d supply, fill #0
  Filled 2023-05-29: qty 90, 30d supply, fill #1

## 2023-04-23 ENCOUNTER — Other Ambulatory Visit: Payer: Self-pay | Admitting: Family Medicine

## 2023-04-23 ENCOUNTER — Other Ambulatory Visit: Payer: Self-pay

## 2023-04-23 MED FILL — Estradiol TD Patch Twice Weekly 0.1 MG/24HR: TRANSDERMAL | 28 days supply | Qty: 8 | Fill #0 | Status: AC

## 2023-04-30 ENCOUNTER — Other Ambulatory Visit: Payer: Self-pay

## 2023-04-30 ENCOUNTER — Ambulatory Visit (INDEPENDENT_AMBULATORY_CARE_PROVIDER_SITE_OTHER): Admitting: Orthopaedic Surgery

## 2023-04-30 ENCOUNTER — Encounter: Payer: Self-pay | Admitting: Orthopaedic Surgery

## 2023-04-30 DIAGNOSIS — M25551 Pain in right hip: Secondary | ICD-10-CM | POA: Diagnosis not present

## 2023-04-30 DIAGNOSIS — M1611 Unilateral primary osteoarthritis, right hip: Secondary | ICD-10-CM

## 2023-04-30 NOTE — Progress Notes (Signed)
 The patient is a 58 year old who is here for follow-up after having an intra-articular steroid injection in her right hip joint by Dr. Shon Baton.  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  We have replaced her right knee before.  She says the injection was very helpful but she still has some days where she has some pain in that right hip.  She is not going to consider any type of surgery on the hip until the Fall of this year if needed.  We did talk about things to avoid for her hip in terms of high impact aerobic activities.  Right hip moves smoothly and fluidly with no blocks to rotation but there is pain in the groin with rotation.  My standpoint we will see her back in about 5 months.  I would like a standing AP pelvis and lateral of her right hip at that visit.  She should work on hip abduction exercises and conditioning until then.  She does not need formal physical therapy because a home exercise program will work enough for her for someone who is highly motivated.

## 2023-05-01 ENCOUNTER — Ambulatory Visit (INDEPENDENT_AMBULATORY_CARE_PROVIDER_SITE_OTHER): Payer: Medicaid Other | Admitting: Licensed Clinical Social Worker

## 2023-05-01 DIAGNOSIS — F4010 Social phobia, unspecified: Secondary | ICD-10-CM

## 2023-05-01 DIAGNOSIS — F4310 Post-traumatic stress disorder, unspecified: Secondary | ICD-10-CM

## 2023-05-01 DIAGNOSIS — F3341 Major depressive disorder, recurrent, in partial remission: Secondary | ICD-10-CM | POA: Diagnosis not present

## 2023-05-01 NOTE — Progress Notes (Signed)
 THERAPIST PROGRESS NOTE  Session Time: 10-10:53am  Participation Level: Active  Behavioral Response: CasualAlertEuthymic  Type of Therapy: Individual Therapy  Treatment Goals addressed:  Template: PTSD (OP)       Problem: BH CCP Acute or Chronic Trauma Reaction     Dates: Start:  03/08/22       Disciplines: Interdisciplinary, PROVIDER        Goal: STG: Reduce the negative impact trauma related symptoms have on social, occupational, and family functioning per pt self-report 3 out of 5 sessions documented.      Dates: Start:  03/08/22    Expected End:  04/06/23       Disciplines: Interdisciplinary, PROVIDER          05/01/23: 50% progress "I have to push myself, but I know I got to get it done."                          Goal: LTG: Reduction in intrusive event recollections, avoidance of event reminders, intense arousal, or disinterest in activities or relationships  as evidenced by pt self report 3 out of 5 sessions documented.      Dates: Start:  03/08/22    Expected End:  04/06/23       Disciplines: Interdisciplinary, PROVIDER                    05/01/23: 50% progress "I still have them quite often (2-3 a week)" but reports she was experiencing them daily.                Goal: STG: [name] will identify internal and external stimuli that trigger PTSD symptoms  as evidenced by pt self report 3 out of 5 sessions documented.      Dates: Start:  03/08/22    Expected End:  04/06/23       Disciplines: Interdisciplinary, PROVIDER  05/01/23: 35% progress "I try to avoid conflicts or in public and someone is having a fuss or screaming children."  Identifies she is able to utilize her coping skills to calm down.                                               Goal: STG: [name] will acknowledge that healing from PTSD is a gradual process  as evidenced by pt self report 3 out of 5 sessions documented.      Dates: Start:  03/08/22    Expected End:  04/06/23        Disciplines: Interdisciplinary, PROVIDER         05/01/23: 100% progress "It's a real slow process for me."              Goal: STG: [name] will identify negative coping strategies that have been used to cope with the feelings associated with the trauma  as evidenced by pt self report 3 out of 5 sessions documented.      Dates: Start:  03/08/22    Expected End:  04/06/23       Disciplines: Interdisciplinary, PROVIDER          05/01/23: 50% progress "My unhealthy was drinking all the time and with being sober 3 years and a couple of months." Acknowledged replacement and use of healthier coping skills. Denies feeling comfortable joining AA but shares she can reach  out to her support system for words of encouragement.                                                     ProgressTowards Goals: Progressing  Interventions: Strength-based and Supportive  Summary: [name] is a 58 y.o. female who presents with symptoms of anxiety, depression, trauma. Patient identifies symptoms to include intrusive thoughts, reexperiencing, tearfulness, low mood, fatigue, difficulty sleeping, anxiety in large crowds. Pt was oriented times 5. Pt was cooperative and engaged. Pt denies SI/HI/AVH.   Clinician utilized session to assess for patient's progress by reviewing the treatment plan.  Patient acknowledges successful use of coping skills to manage her symptoms of trauma and depression.  Patient reports based on her current process of attempting to be approved for disability she is experiencing more low moods and frequent bouts of anxiety.  Patient acknowledges she feels this is based on her situation is not an accurate representation of her overarching progress.  See progress notes listed above.  Addressed patients' difficulties with attention and focus. Patient reports she was on ADHD medications but was reevaluated and tools she does not have ADHD therefore removed from her ADHD medications. Cln  explored a goal to construct lists for the day.  Readminstered PHQ9 and GAD7 to assess for depression and anxiety. Depression scores decreased from 20 to 9. Anxiety scores decreased from 21 to 15. Readminstered the PCL5 with patient scoring a 45.    Suicidal/Homicidal: Nowithout intent/plan  Therapist Response: Cln utilized active listening to create a safe space for patient to process recent life events. Assess for safety, stressors, symptoms since last session.  Assess for patient progress by reviewing treatment plan and administering screeners.  Plan: Return again in 2 weeks.  Diagnosis: PTSD (post-traumatic stress disorder)  Recurrent major depressive disorder, in partial remission (HCC)  Social anxiety disorder   Collaboration of Care: AEB psychiatrist can access notes and cln. Will review psychiatrists' notes. Check in with the patient and will see LCSW per availability. Patient agreed with treatment recommendations.   Patient/Guardian was advised Release of Information must be obtained prior to any record release in order to collaborate their care with an outside provider. Patient/Guardian was advised if they have not already done so to contact the registration department to sign all necessary forms in order for [place] to release information regarding their care.   Consent: Patient/Guardian gives verbal consent for treatment and assignment of benefits for services provided during this visit. Patient/Guardian expressed understanding and agreed to proceed.   Dereck Leep, LCSW 05/01/2023

## 2023-05-03 ENCOUNTER — Other Ambulatory Visit: Payer: Self-pay | Admitting: Family Medicine

## 2023-05-03 DIAGNOSIS — Z1231 Encounter for screening mammogram for malignant neoplasm of breast: Secondary | ICD-10-CM

## 2023-05-14 ENCOUNTER — Ambulatory Visit
Admission: RE | Admit: 2023-05-14 | Discharge: 2023-05-14 | Disposition: A | Discharge status: Home / Self Care | Source: Ambulatory Visit | Attending: Family Medicine | Admitting: Family Medicine

## 2023-05-14 DIAGNOSIS — Z1231 Encounter for screening mammogram for malignant neoplasm of breast: Secondary | ICD-10-CM

## 2023-05-15 ENCOUNTER — Ambulatory Visit (INDEPENDENT_AMBULATORY_CARE_PROVIDER_SITE_OTHER): Admitting: Family Medicine

## 2023-05-15 ENCOUNTER — Other Ambulatory Visit (HOSPITAL_BASED_OUTPATIENT_CLINIC_OR_DEPARTMENT_OTHER): Payer: Self-pay

## 2023-05-15 ENCOUNTER — Encounter: Payer: Self-pay | Admitting: Family Medicine

## 2023-05-15 VITALS — BP 120/66 | HR 86 | Temp 98.5°F | Resp 18 | Ht 62.0 in | Wt 156.0 lb

## 2023-05-15 DIAGNOSIS — R059 Cough, unspecified: Secondary | ICD-10-CM

## 2023-05-15 DIAGNOSIS — J40 Bronchitis, not specified as acute or chronic: Secondary | ICD-10-CM

## 2023-05-15 LAB — POC COVID19 BINAXNOW: SARS Coronavirus 2 Ag: NEGATIVE

## 2023-05-15 MED ORDER — PREDNISONE 10 MG PO TABS
ORAL_TABLET | ORAL | 0 refills | Status: AC
  Filled 2023-05-15: qty 20, 12d supply, fill #0

## 2023-05-15 MED ORDER — PROMETHAZINE-DM 6.25-15 MG/5ML PO SYRP
5.0000 mL | ORAL_SOLUTION | Freq: Four times a day (QID) | ORAL | 0 refills | Status: DC | PRN
  Filled 2023-05-15: qty 118, 6d supply, fill #0

## 2023-05-15 MED ORDER — METHYLPREDNISOLONE ACETATE 80 MG/ML IJ SUSP
80.0000 mg | Freq: Once | INTRAMUSCULAR | Status: AC
  Administered 2023-05-15: 80 mg via INTRAMUSCULAR

## 2023-05-15 MED ORDER — AZITHROMYCIN 250 MG PO TABS
ORAL_TABLET | ORAL | 0 refills | Status: AC
  Filled 2023-05-15: qty 6, 5d supply, fill #0

## 2023-05-15 NOTE — Patient Instructions (Signed)

## 2023-05-15 NOTE — Progress Notes (Signed)
 Established Patient Office Visit  Subjective   Patient ID: [name], female    DOB: 11-Nov-1965  Age: 58 y.o. MRN: [id number]  Chief Complaint  Patient presents with   Cough    With post nasal drainage onset: 7 days     HPI Discussed the use of AI scribe software for clinical note transcription with the patient, who gave verbal consent to proceed.  History of Present Illness [name] is a 58 year old female who presents with a persistent cough and sleep disturbance.  She has been experiencing a persistent cough for the past week, which is particularly troublesome at night, preventing her from sleeping for the last three nights. The cough is severe, with episodes intense enough to require her to sit down to catch her breath. It is less frequent during the day.  She has been using several over-the-counter medications, including Flonase , Chloratab, and Mucinex  Nighttime Cough, without significant relief. She took three doses of Mucinex  last night without improvement in her symptoms or sleep.  She has a history of taking Fortaz, although it is unclear if this is related to her current symptoms. She suspects her symptoms may be related to environmental factors or allergens, as indicated by her comment about 'all the calling and everything.'  Her father is preparing to move in with her, which may be contributing to her stress levels. She also mentions recent physical therapy for a shoulder surgery and her father's eye surgeries, indicating a busy and potentially stressful home environment.   Patient Active Problem List   Diagnosis Date Noted   Status post total right knee replacement 12/12/2022   Cervical stenosis of spine 08/04/2022   At risk for prolonged QT interval syndrome 10/19/2021   PTSD (post-traumatic stress disorder) 04/04/2021   MDD (major depressive disorder), recurrent episode, moderate (HCC) 04/04/2021   Social anxiety disorder 04/04/2021   Dizziness 11/04/2020    Epistaxis 11/04/2020   Chest pain of uncertain etiology 11/04/2020   Anxiety 09/23/2020   Attention and concentration deficit 09/23/2020   Degenerative spondylolisthesis 04/05/2020   Low back pain with radiation 02/06/2020   Bilateral lower extremity edema 04/07/2019   Pain in both lower extremities 04/07/2019   Numbness in both hands 04/07/2019   Pain in right arm 04/07/2019   Recurrent urinary tract infection 10/24/2018   Menopausal syndrome 10/24/2018   Abnormal mammogram 10/24/2018   Abdominal pain, epigastric    Marginal ulcers    Melena 07/10/2018   Alcohol use disorder, severe, in sustained remission (HCC) 07/10/2018   Encounter for screening for HIV 07/10/2018   Asthma 07/10/2018   Exposure to COVID-19 virus 05/28/2018   GERD (gastroesophageal reflux disease) 05/28/2018   Free intraperitoneal air 03/08/2018   Perforated ulcer (HCC) 03/08/2018   Cervical radiculopathy at C7 02/22/2018   Essential hypertension 08/02/2017   Hematoma of hip, left, initial encounter 06/28/2017   Neck pain 06/28/2017   Nonallopathic lesion of cervical region 06/28/2017   Nonallopathic lesion of thoracic region 06/28/2017   Nonallopathic lesion of lumbar region 06/28/2017   Biomechanical lesion, unspecified 06/28/2017   Muscle spasm 04/20/2017   URI (upper respiratory infection) 10/02/2016   Preventative health care 05/01/2016   Patellofemoral arthritis of left knee 05/04/2015   Cough 09/02/2013   Acute pharyngitis 09/02/2013   Sinusitis 06/25/2013   Dog bite of face 04/16/2013   Gastroenteritis 04/16/2013   Allergic rhinitis 04/16/2013   Mass of soft tissue 08/02/2012   RLS (restless legs syndrome) 01/21/2009   LYMPHOPENIA 03/23/2008  Depression 02/27/2008   HEMANGIOMA, SKIN 02/04/2008   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 02/19/2007   Past Medical History:  Diagnosis Date   Allergy    Anemia    Anxiety    Arthritis    Asthma    Depression    GERD (gastroesophageal  reflux disease)    Heart murmur    from childhood- pt had ECHO 12/27/21   Hiatal hernia    Hypertension    Peptic ulcer    PTSD (post-traumatic stress disorder)    UTI (urinary tract infection)    Past Surgical History:  Procedure Laterality Date   ABDOMINAL HYSTERECTOMY     fibroids   BIOPSY  07/11/2018   Procedure: BIOPSY;  Surgeon: Asencion Blacksmith, MD;  Location: [facility] ENDOSCOPY;  Service: Endoscopy;;   BREAST BIOPSY Left    COLONOSCOPY  2014   ESOPHAGOGASTRODUODENOSCOPY (EGD) WITH PROPOFOL  N/A 07/11/2018   Procedure: ESOPHAGOGASTRODUODENOSCOPY (EGD) WITH PROPOFOL ;  Surgeon: Asencion Blacksmith, MD;  Location: [facility] ENDOSCOPY;  Service: Endoscopy;  Laterality: N/A;   GASTRIC BYPASS  2007   HEMOSTASIS CLIP PLACEMENT  07/11/2018   Procedure: HEMOSTASIS CLIP PLACEMENT;  Surgeon: Asencion Blacksmith, MD;  Location: [facility] ENDOSCOPY;  Service: Endoscopy;;   LAPAROTOMY N/A 03/08/2018   Procedure: EXPLORATORY LAPAROTOMY, REPAIR AND PATCH CLOSURE OF PERFORATED ULCER;  Surgeon: Dorena Gander, MD;  Location: [facility] OR;  Service: General;  Laterality: N/A;   REDUCTION MAMMAPLASTY Bilateral    TONSILLECTOMY     and adenoids removed as a child   TOTAL KNEE ARTHROPLASTY Right 12/12/2022   Procedure: RIGHT TOTAL KNEE ARTHROPLASTY;  Surgeon: Arnie Lao, MD;  Location: [facility] OR;  Service: Orthopedics;  Laterality: Right;   TUBAL LIGATION     Social History   Tobacco Use   Smoking status: Former    Current packs/day: 0.00    Types: Cigarettes    Quit date: 01/24/1987    Years since quitting: 36.3   Smokeless tobacco: Never  Vaping Use   Vaping status: Former   Substances: Flavoring   Devices: has vaped with flavor but no nicotine. Been 8 years  Substance Use Topics   Alcohol use: Not Currently    Comment: pt has not drank alcohol since 02/2020   Drug use: No   Social History   Socioeconomic History   Marital status: Married    Spouse name: mark   Number of children: 0   Years of  education: Not on file   Highest education level: High school graduate  Occupational History    Employer: [employer]    Comment: [employer]  Tobacco Use   Smoking status: Former    Current packs/day: 0.00    Types: Cigarettes    Quit date: 01/24/1987    Years since quitting: 36.3   Smokeless tobacco: Never  Vaping Use   Vaping status: Former   Substances: Flavoring   Devices: has vaped with flavor but no nicotine. Been 8 years  Substance and Sexual Activity   Alcohol use: Not Currently    Comment: pt has not drank alcohol since 02/2020   Drug use: No   Sexual activity: Not Currently  Other Topics Concern   Not on file  Social History Narrative   Regular exercise--no (because of knees)   Social Drivers of Health   Financial Resource Strain: High Risk (11/07/2022)   Overall Financial Resource Strain (CARDIA)    Difficulty of Paying Living Expenses: Very hard  Food Insecurity: Food  Insecurity Present (12/12/2022)   Hunger Vital Sign    Worried About Running Out of Food in the Last Year: Sometimes true    Ran Out of Food in the Last Year: Sometimes true  Transportation Needs: No Transportation Needs (12/12/2022)   PRAPARE - Administrator, Civil Service (Medical): No    Lack of Transportation (Non-Medical): No  Physical Activity: Not on file  Stress: Not on file  Social Connections: Moderately Integrated (11/07/2022)   Social Connection and Isolation Panel [NHANES]    Frequency of Communication with Friends and Family: More than three times a week    Frequency of Social Gatherings with Friends and Family: More than three times a week    Attends Religious Services: More than 4 times per year    Active Member of [employer] or Organizations: No    Attends Banker Meetings: Never    Marital Status: Married  Catering manager Violence: Not At Risk (12/12/2022)   Humiliation, Afraid, Rape, and Kick questionnaire    Fear of Current or Ex-Partner: No     Emotionally Abused: No    Physically Abused: No    Sexually Abused: No   Family Status  Relation Name Status   Mother  Deceased   Father  Alive   Sister  Alive   Brother  Alive   MGM  (Not Specified)   MGF  (Not Specified)   PGM  (Not Specified)   PGF  (Not Specified)   Neg Hx  (Not Specified)  No partnership data on file   Family History  Problem Relation Age of Onset   Stroke Mother    Alzheimer's disease Mother    Hypertension Father    Cancer Father        Prostate   Dementia Father    Alcohol abuse Sister    Depression Sister    Alcohol abuse Brother    Depression Brother    Diabetes Maternal Grandmother    Alzheimer's disease Maternal Grandmother    Diabetes Maternal Grandfather    Liver disease Maternal Grandfather    Cancer Maternal Grandfather        Prostate   Diabetes Paternal Grandmother    Alzheimer's disease Paternal Grandmother    Diabetes Paternal Grandfather    Cancer Paternal Grandfather        Lung, Prostate   Colon cancer Neg Hx    Esophageal cancer Neg Hx    Pancreatic cancer Neg Hx    Stomach cancer Neg Hx    Colon polyps Neg Hx    Rectal cancer Neg Hx    Allergies  Allergen Reactions   Lamictal  [Lamotrigine ] Rash   Penicillins Other (See Comments)    Did it involve swelling of the face/tongue/throat, SOB, or low BP? Unk Did it involve sudden or severe rash/hives, skin peeling, or any reaction on the inside of your mouth or nose? Unk Did you need to seek medical attention at a hospital or doctor's office? Unk When did it last happen? Was 58 years old; reaction not recalled     If all above answers are "NO", may proceed with cephalosporin use.    Codeine  Itching    Crazy dreams and itching-pt said she can take it with food      Review of Systems  Constitutional:  Negative for chills, fever and malaise/fatigue.  HENT:  Positive for congestion and sore throat. Negative for hearing loss.   Eyes:  Negative for blurred vision and  discharge.  Respiratory:  Positive for cough and sputum production. Negative for shortness of breath.   Cardiovascular:  Negative for chest pain, palpitations and leg swelling.  Gastrointestinal:  Negative for abdominal pain, blood in stool, constipation, diarrhea, heartburn, nausea and vomiting.  Genitourinary:  Negative for dysuria, frequency, hematuria and urgency.  Musculoskeletal:  Negative for back pain, falls and myalgias.  Skin:  Negative for rash.  Neurological:  Negative for dizziness, sensory change, loss of consciousness, weakness and headaches.  Endo/Heme/Allergies:  Negative for environmental allergies. Does not bruise/bleed easily.  Psychiatric/Behavioral:  Negative for depression and suicidal ideas. The patient is not nervous/anxious and does not have insomnia.       Objective:     BP 120/66   Pulse 86   Temp 98.5 F (36.9 C)   Resp 18   Ht 5\' 2"  (1.575 m)   Wt 156 lb (70.8 kg)   SpO2 100%   BMI 28.53 kg/m  BP Readings from Last 3 Encounters:  05/15/23 120/66  01/09/23 110/70  12/18/22 108/80   Wt Readings from Last 3 Encounters:  05/15/23 156 lb (70.8 kg)  01/09/23 150 lb 4 oz (68.2 kg)  12/12/22 157 lb (71.2 kg)   SpO2 Readings from Last 3 Encounters:  05/15/23 100%  12/18/22 96%  12/14/22 100%      Physical Exam Vitals and nursing note reviewed.  Constitutional:      General: She is not in acute distress.    Appearance: Normal appearance. She is well-developed.  HENT:     Head: Normocephalic and atraumatic.     Right Ear: Tympanic membrane and ear canal normal.     Left Ear: Tympanic membrane and ear canal normal.     Nose: Congestion and rhinorrhea present.     Mouth/Throat:     Pharynx: Posterior oropharyngeal erythema present.  Eyes:     General: No scleral icterus.       Right eye: No discharge.        Left eye: No discharge.  Cardiovascular:     Rate and Rhythm: Normal rate and regular rhythm.     Heart sounds: No murmur  heard. Pulmonary:     Effort: Pulmonary effort is normal. No respiratory distress.     Breath sounds: Decreased breath sounds present. No wheezing.  Chest:     Chest wall: No tenderness.  Musculoskeletal:        General: Normal range of motion.     Cervical back: Normal range of motion and neck supple.     Right lower leg: No edema.     Left lower leg: No edema.  Skin:    General: Skin is warm and dry.  Neurological:     Mental Status: She is alert and oriented to person, place, and time.  Psychiatric:        Mood and Affect: Mood normal.        Behavior: Behavior normal.        Thought Content: Thought content normal.        Judgment: Judgment normal.      Results for orders placed or performed in visit on 05/15/23  POC COVID-19  Result Value Ref Range   SARS Coronavirus 2 Ag Negative Negative    Last CBC Lab Results  Component Value Date   WBC 6.1 12/18/2022   HGB 10.5 (L) 12/18/2022   HCT 30.6 (L) 12/18/2022   MCV 92.4 12/18/2022   MCH 30.3 12/13/2022   RDW 12.3  12/18/2022   PLT 298.0 12/18/2022   Last metabolic panel Lab Results  Component Value Date   GLUCOSE 134 (H) 12/13/2022   NA 135 12/13/2022   K 3.4 (L) 12/13/2022   CL 102 12/13/2022   CO2 27 12/13/2022   BUN 8 12/13/2022   CREATININE 0.88 12/13/2022   GFRNONAA >60 12/13/2022   CALCIUM 8.4 (L) 12/13/2022   PHOS 3.8 01/29/2009   PROT 6.1 (L) 12/05/2022   ALBUMIN 3.5 12/05/2022   LABGLOB 2.1 08/27/2018   AGRATIO 1.9 08/27/2018   BILITOT 0.5 12/05/2022   ALKPHOS 72 12/05/2022   AST 34 12/05/2022   ALT 34 12/05/2022   ANIONGAP 6 12/13/2022   Last lipids Lab Results  Component Value Date   CHOL 174 04/07/2022   HDL 66 04/07/2022   LDLCALC 92 04/07/2022   TRIG 69 04/07/2022   CHOLHDL 2.6 04/07/2022   Last hemoglobin A1c Lab Results  Component Value Date   HGBA1C 5.3 05/24/2022   Last thyroid  functions Lab Results  Component Value Date   TSH 3.15 08/04/2022   Last vitamin D  Lab  Results  Component Value Date   VD25OH 52.80 11/14/2021   Last vitamin B12 and Folate Lab Results  Component Value Date   VITAMINB12 755 08/04/2022   FOLATE >23.7 04/07/2019      The 10-year ASCVD risk score (Arnett DK, et al., 2019) is: 2.4%    Assessment & Plan:   Problem List Items Addressed This Visit       Unprioritized   Cough - Primary   Relevant Medications   promethazine -dextromethorphan (PROMETHAZINE -DM) 6.25-15 MG/5ML syrup   predniSONE  (DELTASONE ) 10 MG tablet   Other Relevant Orders   POC COVID-19 (Completed)   Other Visit Diagnoses       Bronchitis       Relevant Medications   azithromycin  (ZITHROMAX  Z-PAK) 250 MG tablet   predniSONE  (DELTASONE ) 10 MG tablet   methylPREDNISolone  acetate (DEPO-MEDROL ) injection 80 mg (Completed)     Assessment and Plan Assessment & Plan Cough with postnasal drip   She has experienced a persistent cough for one week, worsening at night, likely due to postnasal drip from allergies. Current use of Flonase , Chloratab, and Mucinex  has not provided relief. The cough disrupts sleep and causes breathing difficulty. Allergic rhinitis exacerbated by pollen exposure is suspected. Antibiotics are not expected to help, but a Z-Pak is available if symptoms worsen. Due to the severity of the cough, steroids are considered. A steroid injection will be administered, and a prednisone  taper will start the following day. A Z-Pak is prescribed for potential symptom escalation. Cough syrup is prescribed for nighttime use. Continue current allergy medications, including Flonase  and Chloratab.    Return if symptoms worsen or fail to improve.    Danylah Holden R Lowne Chase, DO

## 2023-05-16 ENCOUNTER — Ambulatory Visit (INDEPENDENT_AMBULATORY_CARE_PROVIDER_SITE_OTHER): Admitting: Licensed Clinical Social Worker

## 2023-05-16 DIAGNOSIS — F4310 Post-traumatic stress disorder, unspecified: Secondary | ICD-10-CM

## 2023-05-16 DIAGNOSIS — F3341 Major depressive disorder, recurrent, in partial remission: Secondary | ICD-10-CM

## 2023-05-16 DIAGNOSIS — F4010 Social phobia, unspecified: Secondary | ICD-10-CM | POA: Diagnosis not present
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
## 2023-05-21 MED FILL — Fluticasone Propionate Nasal Susp 50 MCG/ACT: NASAL | 30 days supply | Qty: 16 | Fill #5 | Status: AC

## 2023-05-21 MED FILL — Furosemide Tab 40 MG: ORAL | 90 days supply | Qty: 90 | Fill #1 | Status: AC

## 2023-05-23 ENCOUNTER — Ambulatory Visit: Admitting: Psychiatry

## 2023-05-23 ENCOUNTER — Encounter: Payer: Self-pay | Admitting: Psychiatry

## 2023-05-23 ENCOUNTER — Other Ambulatory Visit: Payer: Self-pay

## 2023-05-23 VITALS — BP 131/84 | HR 68 | Temp 96.5°F | Ht 62.0 in | Wt 154.6 lb

## 2023-05-23 DIAGNOSIS — G2581 Restless legs syndrome: Secondary | ICD-10-CM

## 2023-05-23 DIAGNOSIS — F4010 Social phobia, unspecified: Secondary | ICD-10-CM

## 2023-05-23 DIAGNOSIS — F1021 Alcohol dependence, in remission: Secondary | ICD-10-CM

## 2023-05-23 DIAGNOSIS — F429 Obsessive-compulsive disorder, unspecified: Secondary | ICD-10-CM

## 2023-05-23 DIAGNOSIS — F4310 Post-traumatic stress disorder, unspecified: Secondary | ICD-10-CM | POA: Diagnosis not present

## 2023-05-23 DIAGNOSIS — F331 Major depressive disorder, recurrent, moderate: Secondary | ICD-10-CM | POA: Diagnosis not present

## 2023-05-23 MED ORDER — HYDROXYZINE PAMOATE 25 MG PO CAPS
25.0000 mg | ORAL_CAPSULE | Freq: Two times a day (BID) | ORAL | 1 refills | Status: DC | PRN
  Filled 2023-05-23 (×2): qty 60, 30d supply, fill #0
  Filled 2023-07-05: qty 60, 30d supply, fill #1

## 2023-05-23 MED ORDER — BUSPIRONE HCL 30 MG PO TABS
30.0000 mg | ORAL_TABLET | Freq: Two times a day (BID) | ORAL | 1 refills | Status: DC
  Filled 2023-05-23 (×2): qty 60, 30d supply, fill #0
  Filled 2023-06-20: qty 60, 30d supply, fill #1

## 2023-05-23 NOTE — Progress Notes (Signed)
 [facility] MD OP Progress Note  05/23/2023 11:50 AM [name]  MRN:  [id number]  Chief Complaint:  Chief Complaint  Patient presents with   Follow-up   Anxiety   Depression   Post-Traumatic Stress Disorder   Medication Refill   Discussed the use of AI scribe software for clinical note transcription with the patient, who gave verbal consent to proceed.  History of Present Illness [name] is a 58 year old Caucasian female, married, unemployed, has a history of PTSD, MDD, social anxiety disorder, RLS, alcohol use disorder in remission, chronic pain, lives in [place], was evaluated in office today.  She is undergoing EMDR therapy with her therapist which has been beneficial but has also led to increased nightmares and anxiety as she processes past trauma. She feels anxious and irritated frequently, attributing this in part to the therapy.  She has a history of alcoholism, with her last use in February 2022, and is currently in sustained remission.   Her disability application is in the federal review process, which she finds encouraging.  She describes symptoms consistent with obsessive-compulsive disorder (OCD), such as needing items to be in specific order or alignment, which has been a lifelong issue. She is currently on Zoloft , Buspar , and Seroquel  for her psychiatric conditions. She takes Seroquel  at night to aid sleep.  She continues to struggle with symptoms of depression worsening since the past few weeks mostly because of reliving her past history of trauma in therapy.  She currently denies any suicidality or homicidality or perceptual disturbances.  She denies any side effects to her current medication regimen other than episodic tremors which are not bothersome.      Visit Diagnosis:    ICD-10-CM   1. PTSD (post-traumatic stress disorder)  F43.10 hydrOXYzine  (VISTARIL ) 25 MG capsule    busPIRone  (BUSPAR ) 30 MG tablet    2. MDD (major depressive disorder),  recurrent episode, moderate (HCC)  F33.1 busPIRone  (BUSPAR ) 30 MG tablet    3. Social anxiety disorder  F40.10 hydrOXYzine  (VISTARIL ) 25 MG capsule    busPIRone  (BUSPAR ) 30 MG tablet    4. RLS (restless legs syndrome)  G25.81     5. Obsessive-compulsive disorder, unspecified type  F42.9     6. Alcohol use disorder, severe, in sustained remission (HCC)  F10.21       Past Psychiatric History: I have reviewed past psychiatric history from progress note on 04/04/2021.  Trials of medications like Wellbutrin, Abilify , Zoloft , Xanax, Cymbalta, Pristiq, BuSpar , Deplin, Lexapro , Valium , Viibryd, Trintellix, Lamictal .  Patient had psychological testing completed-05/05/2021-Dr. Rodenbough, does not meet criteria for ADHD.  Patient evaluated on 06/01/2022-patient recommended to continue psychiatric treatment for PTSD, depression, sleep.  Past Medical History:  Past Medical History:  Diagnosis Date   Allergy    Anemia    Anxiety    Arthritis    Asthma    Depression    GERD (gastroesophageal reflux disease)    Heart murmur    from childhood- pt had ECHO 12/27/21   Hiatal hernia    Hypertension    Peptic ulcer    PTSD (post-traumatic stress disorder)    UTI (urinary tract infection)     Past Surgical History:  Procedure Laterality Date   ABDOMINAL HYSTERECTOMY     fibroids   BIOPSY  07/11/2018   Procedure: BIOPSY;  Surgeon: Asencion Blacksmith, MD;  Location: [facility] ENDOSCOPY;  Service: Endoscopy;;   BREAST BIOPSY Left    COLONOSCOPY  2014   ESOPHAGOGASTRODUODENOSCOPY (EGD) WITH  PROPOFOL  N/A 07/11/2018   Procedure: ESOPHAGOGASTRODUODENOSCOPY (EGD) WITH PROPOFOL ;  Surgeon: Asencion Blacksmith, MD;  Location: [facility] ENDOSCOPY;  Service: Endoscopy;  Laterality: N/A;   GASTRIC BYPASS  2007   HEMOSTASIS CLIP PLACEMENT  07/11/2018   Procedure: HEMOSTASIS CLIP PLACEMENT;  Surgeon: Asencion Blacksmith, MD;  Location: [facility] ENDOSCOPY;  Service: Endoscopy;;   LAPAROTOMY N/A 03/08/2018   Procedure: EXPLORATORY  LAPAROTOMY, REPAIR AND PATCH CLOSURE OF PERFORATED ULCER;  Surgeon: Dorena Gander, MD;  Location: [facility] OR;  Service: General;  Laterality: N/A;   REDUCTION MAMMAPLASTY Bilateral    TONSILLECTOMY     and adenoids removed as a child   TOTAL KNEE ARTHROPLASTY Right 12/12/2022   Procedure: RIGHT TOTAL KNEE ARTHROPLASTY;  Surgeon: Arnie Lao, MD;  Location: [facility] OR;  Service: Orthopedics;  Laterality: Right;   TUBAL LIGATION      Family Psychiatric History: I have reviewed family psychiatric history from progress note on 04/04/2021.  Family History:  Family History  Problem Relation Age of Onset   Stroke Mother    Alzheimer's disease Mother    Hypertension Father    Cancer Father        Prostate   Dementia Father    Alcohol abuse Sister    Depression Sister    Alcohol abuse Brother    Depression Brother    Diabetes Maternal Grandmother    Alzheimer's disease Maternal Grandmother    Diabetes Maternal Grandfather    Liver disease Maternal Grandfather    Cancer Maternal Grandfather        Prostate   Diabetes Paternal Grandmother    Alzheimer's disease Paternal Grandmother    Diabetes Paternal Grandfather    Cancer Paternal Grandfather        Lung, Prostate   Colon cancer Neg Hx    Esophageal cancer Neg Hx    Pancreatic cancer Neg Hx    Stomach cancer Neg Hx    Colon polyps Neg Hx    Rectal cancer Neg Hx     Social History: I have reviewed social history from progress note on 04/04/2021. Social History   Socioeconomic History   Marital status: Married    Spouse name: mark   Number of children: 0   Years of education: Not on file   Highest education level: High school graduate  Occupational History    Employer: [employer]    Comment: [employer]  Tobacco Use   Smoking status: Former    Current packs/day: 0.00    Types: Cigarettes    Quit date: 01/24/1987    Years since quitting: 36.3   Smokeless tobacco: Never  Vaping Use   Vaping status: Former    Substances: Flavoring   Devices: has vaped with flavor but no nicotine. Been 8 years  Substance and Sexual Activity   Alcohol use: Not Currently    Comment: pt has not drank alcohol since 02/2020   Drug use: No   Sexual activity: Not Currently  Other Topics Concern   Not on file  Social History Narrative   Regular exercise--no (because of knees)   Social Drivers of Health   Financial Resource Strain: High Risk (11/07/2022)   Overall Financial Resource Strain (CARDIA)    Difficulty of Paying Living Expenses: Very hard  Food Insecurity: Food Insecurity Present (12/12/2022)   Hunger Vital Sign    Worried About Running Out of Food in the Last Year: Sometimes true    Ran Out of Food in the Last Year: Sometimes true  Transportation Needs: No Transportation Needs (12/12/2022)   PRAPARE - Administrator, Civil Service (Medical): No    Lack of Transportation (Non-Medical): No  Physical Activity: Not on file  Stress: Not on file  Social Connections: Moderately Integrated (11/07/2022)   Social Connection and Isolation Panel [NHANES]    Frequency of Communication with Friends and Family: More than three times a week    Frequency of Social Gatherings with Friends and Family: More than three times a week    Attends Religious Services: More than 4 times per year    Active Member of [employer] or Organizations: No    Attends Banker Meetings: Never    Marital Status: Married    Allergies:  Allergies  Allergen Reactions   Lamictal  [Lamotrigine ] Rash   Penicillins Other (See Comments)    Did it involve swelling of the face/tongue/throat, SOB, or low BP? Unk Did it involve sudden or severe rash/hives, skin peeling, or any reaction on the inside of your mouth or nose? Unk Did you need to seek medical attention at a hospital or doctor's office? Unk When did it last happen? Was 58 years old; reaction not recalled     If all above answers are "NO", may proceed with  cephalosporin use.    Codeine  Itching    Crazy dreams and itching-pt said she can take it with food    Metabolic Disorder Labs: Lab Results  Component Value Date   HGBA1C 5.3 05/24/2022   MPG 105.41 05/24/2022   Lab Results  Component Value Date   PROLACTIN 16.9 05/24/2022   Lab Results  Component Value Date   CHOL 174 04/07/2022   TRIG 69 04/07/2022   HDL 66 04/07/2022   CHOLHDL 2.6 04/07/2022   VLDL 9.0 07/19/2021   LDLCALC 92 04/07/2022   LDLCALC 70 07/19/2021   Lab Results  Component Value Date   TSH 3.15 08/04/2022   TSH 1.87 04/07/2022    Therapeutic Level Labs: No results found for: "LITHIUM" No results found for: "VALPROATE" No results found for: "CBMZ"  Current Medications: Current Outpatient Medications  Medication Sig Dispense Refill   acetaminophen  (TYLENOL ) 650 MG CR tablet Take 1,300 mg by mouth every 8 (eight) hours as needed (pain).     busPIRone  (BUSPAR ) 30 MG tablet Take 1 tablet (30 mg total) by mouth 2 (two) times daily. 60 tablet 1   Cholecalciferol  (VITAMIN D3) 50 MCG (2000 UT) capsule Take 1 capsule (2,000 Units total) by mouth daily. 100 capsule 0   cyanocobalamin  (VITAMIN B12) 1000 MCG/ML injection Inject 1 mL (1,000 mcg total) into the muscle every 30 (thirty) days. 10 mL 0   cyclobenzaprine  (FLEXERIL ) 10 MG tablet Take 1 tablet (10 mg total) by mouth 3 (three) times daily as needed. for muscle spams 30 tablet 2   Diclofenac  Sodium 2 % SOLN Apply 1 pump twice daily as needed. 112 g 1   diphenoxylate -atropine  (LOMOTIL ) 2.5-0.025 MG tablet Take 1 tablet by mouth 4 (four) times daily as needed for diarrhea or loose stools. 90 tablet 1   estradiol  (ESTRACE ) 0.1 MG/GM vaginal cream Apply a pea-sized amount of cream to the fingertip and wipe in the front part of the vagina twice weekly 42.5 g 0   estradiol  (VIVELLE -DOT) 0.1 MG/24HR patch Place 1 patch (0.1 mg total) onto the skin 2 (two) times a week. 8 patch 2   fluticasone  (FLONASE ) 50 MCG/ACT  nasal spray Place 2 sprays into both nostrils daily. (Patient taking  differently: Place 2 sprays into both nostrils daily as needed for allergies.) 16 g 5   furosemide  (LASIX ) 40 MG tablet Take 1 tablet (40 mg total) by mouth daily. 90 tablet 1   Homeopathic Products (ALLERGY MEDICINE PO) Take 1 tablet by mouth daily as needed (Takes the Clortabs. Equate Brand for allergies).     omeprazole  (PRILOSEC) 40 MG capsule Take 1 capsule (40 mg total) by mouth 2 (two) times daily. OPEN CAPSULE AND SPRINKLE ON APPLESAUCE. 60 capsule 11   OVER THE COUNTER MEDICATION Take 2 capsules by mouth daily. Mushroom Complex     predniSONE  (DELTASONE ) 10 MG tablet Take 3 tablets (30 mg total) by mouth daily for 3 days, THEN 2 tablets (20 mg total) daily for 3 days, THEN 1 tablet (10 mg total) daily for 3 days, THEN 0.5 tablets (5 mg total) daily for 3 days. 20 tablet 0   Probiotic Product (ALIGN) CHEW Chew 2 tablets by mouth daily. Align pre and probiotic with vitamain C-take one tablet each Align pre and probiotic with Vitamin b12 for immune and energy-take one tablet each     promethazine -dextromethorphan (PROMETHAZINE -DM) 6.25-15 MG/5ML syrup Take 5 mLs by mouth 4 (four) times daily as needed. 118 mL 0   QUEtiapine  (SEROQUEL ) 25 MG tablet Take 3 tablets (75 mg total) by mouth at bedtime. Dose change, stop the 50 mg 90 tablet 1   rOPINIRole  (REQUIP ) 1 MG tablet Take 1 tablet (1 mg total) by mouth at bedtime. 90 tablet 1   sertraline  (ZOLOFT ) 100 MG tablet Take 2 tablets (200 mg total) by mouth daily. 180 tablet 1   SYRINGE-NEEDLE, DISP, 3 ML (B-D 3CC LUER-LOK SYR 25GX5/8") 25G X 5/8" 3 ML MISC USE AS DIRECTED WITH B-12 INJECTION 16 each 2   hydrOXYzine  (VISTARIL ) 25 MG capsule Take 1 capsule (25 mg total) by mouth 2 (two) times daily as needed for anxiety. 60 capsule 1   No current facility-administered medications for this visit.     Musculoskeletal: Strength & Muscle Tone: within normal limits Gait & Station:  normal Patient leans: N/A  Psychiatric Specialty Exam: Review of Systems  Psychiatric/Behavioral:  Positive for dysphoric mood and sleep disturbance. The patient is nervous/anxious.     Blood pressure 131/84, pulse 68, temperature (!) 96.5 F (35.8 C), temperature source Temporal, height 5\' 2"  (1.575 m), weight 154 lb 9.6 oz (70.1 kg).Body mass index is 28.28 kg/m.  General Appearance: Casual  Eye Contact:  Fair  Speech:  Clear and Coherent  Volume:  Normal  Mood:  Anxious and Depressed  Affect:  Congruent  Thought Process:  Goal Directed and Descriptions of Associations: Intact  Orientation:  Full (Time, Place, and Person)  Thought Content: Logical   Suicidal Thoughts:  No  Homicidal Thoughts:  No  Memory:  Immediate;   Fair Recent;   Fair Remote;   Fair  Judgement:  Fair  Insight:  Fair  Psychomotor Activity:  Normal  Concentration:  Concentration: Fair and Attention Span: Fair  Recall:  [employer] of Knowledge: Fair  Language: Fair  Akathisia:  No  Handed:  Right  AIMS (if indicated): done  Assets:  Desire for Improvement Housing Social Support Transportation  ADL's:  Intact  Cognition: WNL  Sleep:   Restless at times    Screenings: AIMS    Flowsheet Row Office Visit from 05/23/2023 in [facility] Office Visit from 11/01/2022 in [facility] Office Visit from 03/03/2022  in [facility] Office Visit from 01/20/2022 in [facility] Office Visit from 11/17/2021 in [facility]  AIMS Total Score 0 0 0 0 0      GAD-7    Flowsheet Row Office Visit from 05/23/2023 in [facility] Counselor from 05/01/2023 in [facility] Counselor from 11/07/2022 in [facility] Office Visit from 11/01/2022 in [facility] Office Visit from 04/07/2022 in [facility]  Total GAD-7 Score 17 15 21 20 18       PHQ2-9    Flowsheet Row Office Visit from 05/23/2023 in [facility] Counselor from 05/01/2023 in [facility] Counselor from 11/07/2022 in [facility] Office Visit from 11/01/2022 in [facility] Video Visit from 08/21/2022 in [facility]  PHQ-2 Total Score 4 4 6 5 1   PHQ-9 Total Score 16 9 20 18  --      Flowsheet Row Office Visit from 05/23/2023 in [facility] Counselor from 05/01/2023 in [facility] Video Visit from 03/26/2023 in [facility]  C-SSRS RISK CATEGORY Moderate Risk Moderate Risk Moderate Risk        Assessment and Plan:Lyrical Garton is a 58 year old Caucasian female who has a history of PTSD, MDD, social anxiety, alcohol use disorder in remission, chronic pain was evaluated in office today, discussed assessment and plan as noted below.    Assessment & Plan Major depression, moderate-unstable PTSD-unstable Social anxiety disorder-some improvement Obsessive-Compulsive Disorder (OCD) unspecified OCD symptoms have been lifelong, characterized by a need for symmetry and order, such as ensuring even numbers on devices and straightening furniture. Current treatment includes therapy and medications such as SSRIs, Buspar , and Seroquel . She reports ongoing anxiety and irritation, potentially exacerbated by EMDR therapy for PTSD. The treatment plan includes increasing Buspar  dosage, with a consideration of increasing Seroquel  if needed, despite its  potential side effect of weight gain. - Increase Buspar  to 30 mg twice daily. - Continue Seroquel  75 mg daily at bedtime ( EKG - 02/25/23- NSR,qtc- 415) - Continue Zoloft  200 mg daily - Continue Hydroxyzine  25 mg twice a day as needed. - Continue psychotherapy sessions with Ms. Almetta Jacquet  Restless leg symptoms-improving Currently sleep restless due to recent EMDR therapy otherwise denies any restless leg symptoms affecting sleep. - Continue Ropinirole  1 mg at bedtime - Continue Ambien  5 mg at bedtime as needed, rarely uses it.  Alcoholism in sustained remission Alcoholism is in sustained remission, with the last use in February 2022.   Follow-up Follow-up in clinic in 4 weeks or sooner if needed.   Collaboration of Care: Collaboration of Care: Referral or follow-up with counselor/therapist AEB encouraged to continue CBT/EMDR as well as encouraged to pursue exposure and response prevention for obsessional and compulsive symptoms.  Patient/Guardian was advised Release of Information must be obtained prior to any record release in order to collaborate their care with an outside provider. Patient/Guardian was advised if they have not already done so to contact the registration department to sign all necessary forms in order for us  to release information regarding their care.   Consent: Patient/Guardian gives verbal consent for treatment and assignment of benefits for services provided during  this visit. Patient/Guardian expressed understanding and agreed to proceed.   This note was generated in part or whole with voice recognition software. Voice recognition is usually quite accurate but there are transcription errors that can and very often do occur. I apologize for any typographical errors that were not detected and corrected.    Mostyn Varnell, MD 05/25/2023, 7:57 AM

## 2023-05-24 ENCOUNTER — Other Ambulatory Visit (HOSPITAL_COMMUNITY): Payer: Self-pay

## 2023-05-25 ENCOUNTER — Encounter: Payer: Self-pay | Admitting: Psychiatry

## 2023-05-29 MED FILL — Estradiol TD Patch Twice Weekly 0.1 MG/24HR: TRANSDERMAL | 28 days supply | Qty: 8 | Fill #1 | Status: AC

## 2023-05-30 ENCOUNTER — Ambulatory Visit (INDEPENDENT_AMBULATORY_CARE_PROVIDER_SITE_OTHER): Admitting: Licensed Clinical Social Worker

## 2023-05-30 DIAGNOSIS — F4310 Post-traumatic stress disorder, unspecified: Secondary | ICD-10-CM

## 2023-05-30 DIAGNOSIS — F3341 Major depressive disorder, recurrent, in partial remission: Secondary | ICD-10-CM | POA: Diagnosis not present

## 2023-05-30 DIAGNOSIS — F4010 Social phobia, unspecified: Secondary | ICD-10-CM

## 2023-05-30 NOTE — Progress Notes (Signed)
 THERAPIST PROGRESS NOTE  Session Time: 8:04am-8:55am  Participation Level: Active  Behavioral Response: CasualAlertEuthymic  Type of Therapy: Individual Therapy  Treatment Goals addressed:  Goal: STG: Reduce the negative impact trauma related symptoms have on social, occupational, and family functioning per pt self report 3 out of 5 sessions documented.                                         Goal: LTG: Reduction in intrusive event recollections, avoidance of event reminders, intense arousal, or disinterest in activities or relationships  as evidenced by pt self report 3 out of 5 sessions documented.                                      Goal: STG: Jenalyn will identify internal and external stimuli that trigger PTSD symptoms  as evidenced by pt self report 3 out of 5 sessions documented.                                      Goal: STG: Kadajah will acknowledge that healing from PTSD is a gradual process  as evidenced by pt self report 3 out of 5 sessions documented.                                      Goal: STG: Tiawna will identify negative coping strategies that have been used to cope with the feelings associated with the trauma  as evidenced by pt self report 3 out of 5 sessions documented.          ProgressTowards Goals: Progressing  Interventions: Supportive and Other: EMDR  Summary: [name] is a 58 y.o. female who presents with symptoms of anxiety, depression, trauma. Patient identifies symptoms to include intrusive thoughts, reexperiencing, tearfulness, low mood, fatigue, difficulty sleeping, anxiety in large crowds. Pt was oriented times 5. Pt was cooperative and engaged. Pt denies SI/HI/AVH.   Patient provided an update on her disability process reporting a release of stress related to her approval.  Patient states "I am doing better than usual."  Clinician and patient continued EMDR by processing a new memory where patient felt trapped.   Clinician and patient worked on desensitizing this memory and further instilling the belief "I can survive/I did survive."   Patient reflected on presentation of OCD symptoms when it comes to numbers and counting.  Through conversation with therapist, patient was able to identify a connection between her trauma history and counting to self sooth.  Suicidal/Homicidal: Nowithout intent/plan  Therapist Response: Cln utilized active listening to create a safe space for patient to process recent life events. Assessed for safety, stressors, symptoms since last session. Provided pscyhoeducation on safety behaviors with social anxiety. Utilized EMDR to continue to work through trauma memories and reframe negative cognitions.   Plan: Return again in 2 weeks.  Diagnosis: PTSD (post-traumatic stress disorder)  MDD (major depressive disorder), recurrent, in partial remission (HCC)  Social anxiety disorder   Collaboration of Care: AEB psychiatrist can access notes and cln. Will review psychiatrists' notes. Check in with the patient and will see LCSW per availability. Patient agreed with treatment recommendations.  Patient/Guardian was advised Release of Information must be obtained prior to any record release in order to collaborate their care with an outside provider. Patient/Guardian was advised if they have not already done so to contact the registration department to sign all necessary forms in order for us  to release information regarding their care.   Consent: Patient/Guardian gives verbal consent for treatment and assignment of benefits for services provided during this visit. Patient/Guardian expressed understanding and agreed to proceed.   Marvin Slot, LCSW 05/30/2023

## 2023-06-11 ENCOUNTER — Ambulatory Visit (INDEPENDENT_AMBULATORY_CARE_PROVIDER_SITE_OTHER): Admitting: Licensed Clinical Social Worker

## 2023-06-11 DIAGNOSIS — F3341 Major depressive disorder, recurrent, in partial remission: Secondary | ICD-10-CM | POA: Diagnosis not present

## 2023-06-11 DIAGNOSIS — F4310 Post-traumatic stress disorder, unspecified: Secondary | ICD-10-CM

## 2023-06-11 DIAGNOSIS — F4010 Social phobia, unspecified: Secondary | ICD-10-CM | POA: Diagnosis not present

## 2023-06-11 NOTE — Progress Notes (Signed)
 THERAPIST PROGRESS NOTE  Session Time: 8:03am-8:47am  Participation Level: Active  Behavioral Response: CasualAlertEuthymic  Type of Therapy: Individual Therapy  Treatment Goals addressed:      Goal: STG: Reduce the negative impact trauma related symptoms have on social, occupational, and family functioning per pt self report 3 out of 5 sessions documented.                                         Goal: LTG: Reduction in intrusive event recollections, avoidance of event reminders, intense arousal, or disinterest in activities or relationships  as evidenced by pt self report 3 out of 5 sessions documented.                                         Goal: STG: Rebecca will identify internal and external stimuli that trigger PTSD symptoms  as evidenced by pt self report 3 out of 5 sessions documented.                                         Goal: STG: Keesha will acknowledge that healing from PTSD is a gradual process  as evidenced by pt self report 3 out of 5 sessions documented.                                         Goal: STG: Ashira will identify negative coping strategies that have been used to cope with the feelings associated with the trauma  as evidenced by pt self report 3 out of 5 sessions documented.             ProgressTowards Goals: Progressing  Interventions: Supportive and Other: EMDR  Summary: [name] is a 58 y.o. female who presents with symptoms of anxiety, depression, trauma. Patient identifies symptoms to include intrusive thoughts, reexperiencing, tearfulness, low mood, fatigue, difficulty sleeping, anxiety in large crowds. Pt was oriented times 5. Pt was cooperative and engaged. Pt denies SI/HI/AVH.   The clinician and patient continued with EMDR therapy to process a recent traumatic experience involving a near-death situation. The patient felt that she could not survive the worst-case scenario. Together, they worked on desensitizing this  memory and reinforcing the belief, "I did survive."  Reflected on the patient spritual beliefs in aiding in her ability to cope and reframe negative cognitions.   Suicidal/Homicidal: Nowithout intent/plan  Therapist Response: Cln utilized active listening to create a safe space for patient to process recent life events. Assessed for safety, stressors, symptoms since last session. Provided pscyhoeducation on safety behaviors with social anxiety. Utilized EMDR to continue to work through trauma memories and reframe negative cognitions.   Plan: Return again in 2 weeks.  Diagnosis: PTSD (post-traumatic stress disorder)  MDD (major depressive disorder), recurrent, in partial remission (HCC)  Social anxiety disorder   Collaboration of Care: Psychiatrist AEB AEB psychiatrist can access notes and cln. Will review psychiatrists' notes. Check in with the patient and will see LCSW per availability. Patient agreed with treatment recommendations.   Patient/Guardian was advised Release of Information must be obtained prior to any record release in  order to collaborate their care with an outside provider. Patient/Guardian was advised if they have not already done so to contact the registration department to sign all necessary forms in order for us  to release information regarding their care.   Consent: Patient/Guardian gives verbal consent for treatment and assignment of benefits for services provided during this visit. Patient/Guardian expressed understanding and agreed to proceed.   Marvin Slot, LCSW 06/11/2023

## 2023-06-15 ENCOUNTER — Other Ambulatory Visit: Payer: Self-pay | Admitting: Family Medicine

## 2023-06-15 DIAGNOSIS — J0110 Acute frontal sinusitis, unspecified: Secondary | ICD-10-CM

## 2023-06-19 ENCOUNTER — Other Ambulatory Visit: Payer: Self-pay

## 2023-06-19 MED ORDER — FLUTICASONE PROPIONATE 50 MCG/ACT NA SUSP
2.0000 | Freq: Every day | NASAL | 5 refills | Status: AC
  Filled 2023-06-19: qty 16, 30d supply, fill #0
  Filled 2023-07-18: qty 16, 30d supply, fill #1
  Filled 2024-01-29: qty 16, 30d supply, fill #2
  Filled 2024-02-27: qty 16, 30d supply, fill #3

## 2023-06-22 ENCOUNTER — Encounter: Payer: Self-pay | Admitting: Psychiatry

## 2023-06-22 ENCOUNTER — Other Ambulatory Visit: Payer: Self-pay

## 2023-06-22 ENCOUNTER — Telehealth: Admitting: Psychiatry

## 2023-06-22 DIAGNOSIS — F4010 Social phobia, unspecified: Secondary | ICD-10-CM | POA: Diagnosis not present

## 2023-06-22 DIAGNOSIS — G2581 Restless legs syndrome: Secondary | ICD-10-CM | POA: Diagnosis not present

## 2023-06-22 DIAGNOSIS — F429 Obsessive-compulsive disorder, unspecified: Secondary | ICD-10-CM

## 2023-06-22 DIAGNOSIS — F3341 Major depressive disorder, recurrent, in partial remission: Secondary | ICD-10-CM

## 2023-06-22 DIAGNOSIS — F4310 Post-traumatic stress disorder, unspecified: Secondary | ICD-10-CM | POA: Diagnosis not present

## 2023-06-22 DIAGNOSIS — F1021 Alcohol dependence, in remission: Secondary | ICD-10-CM

## 2023-06-22 MED ORDER — QUETIAPINE FUMARATE 25 MG PO TABS
75.0000 mg | ORAL_TABLET | Freq: Every day | ORAL | 1 refills | Status: DC
Start: 2023-06-22 — End: 2023-09-15
  Filled 2023-06-22: qty 90, 30d supply, fill #0
  Filled 2023-07-29: qty 90, 30d supply, fill #1

## 2023-06-22 NOTE — Progress Notes (Signed)
 Virtual Visit via Video Note  I connected with [name] on 06/22/23 at 10:00 AM EDT by a video enabled telemedicine application and verified that I am speaking with the correct person using two identifiers.  Location Provider Location : [facility] Patient Location : Home  Participants: Patient , Provider    I discussed the limitations of evaluation and management by telemedicine and the availability of in person appointments. The patient expressed understanding and agreed to proceed.   I discussed the assessment and treatment plan with the patient. The patient was provided an opportunity to ask questions and all were answered. The patient agreed with the plan and demonstrated an understanding of the instructions.   The patient was advised to call back or seek an in-person evaluation if the symptoms worsen or if the condition fails to improve as anticipated.   BH MD OP Progress Note  06/22/2023 10:29 AM [name]  MRN:  [id number]  Chief Complaint:  Chief Complaint  Patient presents with   Follow-up   Anxiety   Depression   Insomnia   Medication Refill   Discussed the use of AI scribe software for clinical note transcription with the patient, who gave verbal consent to proceed.  History of Present Illness [name] is a 58 year old Caucasian female, married, recently approved for Social Security disability, has a history of PTSD, MDD, OCD, social anxiety disorder, RLS, alcohol use disorder in remission, chronic pain, [place] was evaluated by telemedicine today.  She is currently taking Buspar  30 mg twice daily, Seroquel  75 mg, Zoloft  200 mg, and hydroxyzine  25 mg twice a day as needed. She experiences ongoing frustration and anger without a clear trigger, which she finds difficult to articulate. She is actively engaged in therapy, working on coping strategies and completing therapy-related assignments.  Her OCD symptoms which is the need to keep everything  in a certain order continues to be an issue, currently taking the higher dosage of BuSpar  as well as the Zoloft .Her therapist plans to focus more on OCD treatment following the completion of EMDR therapy. She experiences occasional nightmares and poor sleep, though there has been an improvement in the frequency of these episodes.  Denies suicidal thoughts, thoughts of harming others, auditory hallucinations, or paranoia. She has not experienced any concerning side effects from her medications and there have been no changes to her medication list, allergies, or medical problems since the last visit.  She denies any other concerns today.   Visit Diagnosis:    ICD-10-CM   1. PTSD (post-traumatic stress disorder)  F43.10 QUEtiapine  (SEROQUEL ) 25 MG tablet    2. MDD (major depressive disorder), recurrent, in partial remission (HCC)  F33.41     3. Social anxiety disorder  F40.10     4. RLS (restless legs syndrome)  G25.81     5. Obsessive-compulsive disorder, unspecified type  F42.9     6. Alcohol use disorder, severe, in sustained remission (HCC)  F10.21       Past Psychiatric History: I have reviewed past psychiatric history from progress note on 04/04/2021.  Trials of medications like Wellbutrin, Abilify , Zoloft , Xanax, Cymbalta, Pristiq, BuSpar , Deplin, Lexapro , Valium , Viibryd, Trintellix, Lamictal .  Patient had neuropsychological testing completed-05/05/2021-Dr. Rodenbough-does not meet criteria for ADHD.  Evaluated again on 06/01/2022-patient at that session was recommended to continue psychiatric treatment for PTSD, depression and sleep.  Past Medical History:  Past Medical History:  Diagnosis Date   Allergy    Anemia  Anxiety    Arthritis    Asthma    Depression    GERD (gastroesophageal reflux disease)    Heart murmur    from childhood- pt had ECHO 12/27/21   Hiatal hernia    Hypertension    Peptic ulcer    PTSD (post-traumatic stress disorder)    UTI (urinary tract  infection)     Past Surgical History:  Procedure Laterality Date   ABDOMINAL HYSTERECTOMY     fibroids   BIOPSY  07/11/2018   Procedure: BIOPSY;  Surgeon: Asencion Blacksmith, MD;  Location: [facility] ENDOSCOPY;  Service: Endoscopy;;   BREAST BIOPSY Left    COLONOSCOPY  2014   ESOPHAGOGASTRODUODENOSCOPY (EGD) WITH PROPOFOL  N/A 07/11/2018   Procedure: ESOPHAGOGASTRODUODENOSCOPY (EGD) WITH PROPOFOL ;  Surgeon: Asencion Blacksmith, MD;  Location: [facility] ENDOSCOPY;  Service: Endoscopy;  Laterality: N/A;   GASTRIC BYPASS  2007   HEMOSTASIS CLIP PLACEMENT  07/11/2018   Procedure: HEMOSTASIS CLIP PLACEMENT;  Surgeon: Asencion Blacksmith, MD;  Location: [facility] ENDOSCOPY;  Service: Endoscopy;;   LAPAROTOMY N/A 03/08/2018   Procedure: EXPLORATORY LAPAROTOMY, REPAIR AND PATCH CLOSURE OF PERFORATED ULCER;  Surgeon: Dorena Gander, MD;  Location[facility] OR;  Service: General;  Laterality: N/A;   REDUCTION MAMMAPLASTY Bilateral    TONSILLECTOMY     and adenoids removed as a child   TOTAL KNEE ARTHROPLASTY Right 12/12/2022   Procedure: RIGHT TOTAL KNEE ARTHROPLASTY;  Surgeon: Arnie Lao, MD;  Location: [facility] OR;  Service: Orthopedics;  Laterality: Right;   TUBAL LIGATION      Family Psychiatric History: I have reviewed family psychiatric history from progress note on 04/04/2021.  Family History:  Family History  Problem Relation Age of Onset   Stroke Mother    Alzheimer's disease Mother    Hypertension Father    Cancer Father        Prostate   Dementia Father    Alcohol abuse Sister    Depression Sister    Alcohol abuse Brother    Depression Brother    Diabetes Maternal Grandmother    Alzheimer's disease Maternal Grandmother    Diabetes Maternal Grandfather    Liver disease Maternal Grandfather    Cancer Maternal Grandfather        Prostate   Diabetes Paternal Grandmother    Alzheimer's disease Paternal Grandmother    Diabetes Paternal Grandfather    Cancer Paternal Grandfather        Lung, Prostate    Colon cancer Neg Hx    Esophageal cancer Neg Hx    Pancreatic cancer Neg Hx    Stomach cancer Neg Hx    Colon polyps Neg Hx    Rectal cancer Neg Hx     Social History: I have reviewed social history from progress note on 04/04/2021. Social History   Socioeconomic History   Marital status: Married    Spouse name: mark   Number of children: 0   Years of education: Not on file   Highest education level: High school graduate  Occupational History    Employer: [employer]    Comment: [employer]  Tobacco Use   Smoking status: Former    Current packs/day: 0.00    Types: Cigarettes    Quit date: 01/24/1987    Years since quitting: 36.4   Smokeless tobacco: Never  Vaping Use   Vaping status: Former   Substances: Flavoring   Devices: has vaped with flavor but no nicotine. Been 8 years  Substance and Sexual Activity  Alcohol use: Not Currently    Comment: pt has not drank alcohol since 02/2020   Drug use: No   Sexual activity: Not Currently  Other Topics Concern   Not on file  Social History Narrative   Regular exercise--no (because of knees)   Social Drivers of Health   Financial Resource Strain: High Risk (11/07/2022)   Overall Financial Resource Strain (CARDIA)    Difficulty of Paying Living Expenses: Very hard  Food Insecurity: Food Insecurity Present (12/12/2022)   Hunger Vital Sign    Worried About Running Out of Food in the Last Year: Sometimes true    Ran Out of Food in the Last Year: Sometimes true  Transportation Needs: No Transportation Needs (12/12/2022)   PRAPARE - Administrator, Civil Service (Medical): No    Lack of Transportation (Non-Medical): No  Physical Activity: Not on file  Stress: Not on file  Social Connections: Moderately Integrated (11/07/2022)   Social Connection and Isolation Panel [NHANES]    Frequency of Communication with Friends and Family: More than three times a week    Frequency of Social Gatherings with Friends and  Family: More than three times a week    Attends Religious Services: More than 4 times per year    Active Member of [employer] or Organizations: No    Attends Banker Meetings: Never    Marital Status: Married    Allergies:  Allergies  Allergen Reactions   Lamictal  [Lamotrigine ] Rash   Penicillins Other (See Comments)    Did it involve swelling of the face/tongue/throat, SOB, or low BP? Unk Did it involve sudden or severe rash/hives, skin peeling, or any reaction on the inside of your mouth or nose? Unk Did you need to seek medical attention at a hospital or doctor's office? Unk When did it last happen? Was 58 years old; reaction not recalled     If all above answers are "NO", may proceed with cephalosporin use.    Codeine  Itching    Crazy dreams and itching-pt said she can take it with food    Metabolic Disorder Labs: Lab Results  Component Value Date   HGBA1C 5.3 05/24/2022   MPG 105.41 05/24/2022   Lab Results  Component Value Date   PROLACTIN 16.9 05/24/2022   Lab Results  Component Value Date   CHOL 174 04/07/2022   TRIG 69 04/07/2022   HDL 66 04/07/2022   CHOLHDL 2.6 04/07/2022   VLDL 9.0 07/19/2021   LDLCALC 92 04/07/2022   LDLCALC 70 07/19/2021   Lab Results  Component Value Date   TSH 3.15 08/04/2022   TSH 1.87 04/07/2022    Therapeutic Level Labs: No results found for: "LITHIUM" No results found for: "VALPROATE" No results found for: "CBMZ"  Current Medications: Current Outpatient Medications  Medication Sig Dispense Refill   acetaminophen  (TYLENOL ) 650 MG CR tablet Take 1,300 mg by mouth every 8 (eight) hours as needed (pain).     busPIRone  (BUSPAR ) 30 MG tablet Take 1 tablet (30 mg total) by mouth 2 (two) times daily. 60 tablet 1   Cholecalciferol  (VITAMIN D3) 50 MCG (2000 UT) capsule Take 1 capsule (2,000 Units total) by mouth daily. 100 capsule 0   cyanocobalamin  (VITAMIN B12) 1000 MCG/ML injection Inject 1 mL (1,000 mcg total) into the  muscle every 30 (thirty) days. 10 mL 0   cyclobenzaprine  (FLEXERIL ) 10 MG tablet Take 1 tablet (10 mg total) by mouth 3 (three) times daily as needed. for muscle spams  30 tablet 2   Diclofenac  Sodium 2 % SOLN Apply 1 pump twice daily as needed. 112 g 1   diphenoxylate -atropine  (LOMOTIL ) 2.5-0.025 MG tablet Take 1 tablet by mouth 4 (four) times daily as needed for diarrhea or loose stools. 90 tablet 1   estradiol  (ESTRACE ) 0.1 MG/GM vaginal cream Apply a pea-sized amount of cream to the fingertip and wipe in the front part of the vagina twice weekly 42.5 g 0   estradiol  (VIVELLE -DOT) 0.1 MG/24HR patch Place 1 patch (0.1 mg total) onto the skin 2 (two) times a week. 8 patch 2   fluticasone  (FLONASE ) 50 MCG/ACT nasal spray Place 2 sprays into both nostrils daily. 16 g 5   furosemide  (LASIX ) 40 MG tablet Take 1 tablet (40 mg total) by mouth daily. 90 tablet 1   Homeopathic Products (ALLERGY MEDICINE PO) Take 1 tablet by mouth daily as needed (Takes the Clortabs. Equate Brand for allergies).     hydrOXYzine  (VISTARIL ) 25 MG capsule Take 1 capsule (25 mg total) by mouth 2 (two) times daily as needed for anxiety. 60 capsule 1   omeprazole  (PRILOSEC) 40 MG capsule Take 1 capsule (40 mg total) by mouth 2 (two) times daily. OPEN CAPSULE AND SPRINKLE ON APPLESAUCE. 60 capsule 11   OVER THE COUNTER MEDICATION Take 2 capsules by mouth daily. Mushroom Complex     Probiotic Product (ALIGN) CHEW Chew 2 tablets by mouth daily. Align pre and probiotic with vitamain C-take one tablet each Align pre and probiotic with Vitamin b12 for immune and energy-take one tablet each     promethazine -dextromethorphan (PROMETHAZINE -DM) 6.25-15 MG/5ML syrup Take 5 mLs by mouth 4 (four) times daily as needed. 118 mL 0   QUEtiapine  (SEROQUEL ) 25 MG tablet Take 3 tablets (75 mg total) by mouth at bedtime. 90 tablet 1   rOPINIRole  (REQUIP ) 1 MG tablet Take 1 tablet (1 mg total) by mouth at bedtime. 90 tablet 1   sertraline  (ZOLOFT ) 100  MG tablet Take 2 tablets (200 mg total) by mouth daily. 180 tablet 1   SYRINGE-NEEDLE, DISP, 3 ML (B-D 3CC LUER-LOK SYR 25GX5/8") 25G X 5/8" 3 ML MISC USE AS DIRECTED WITH B-12 INJECTION 16 each 2   No current facility-administered medications for this visit.     Musculoskeletal: Strength & Muscle Tone: UTA Gait & Station: Seated Patient leans: N/A  Psychiatric Specialty Exam: Review of Systems  Psychiatric/Behavioral:  Positive for sleep disturbance. The patient is nervous/anxious.        Irritable on and off - improving    There were no vitals taken for this visit.There is no height or weight on file to calculate BMI.  General Appearance: Casual  Eye Contact:  Good  Speech:  Clear and Coherent  Volume:  Normal  Mood:  Anxious and Irritable  Affect:  Congruent  Thought Process:  Goal Directed and Descriptions of Associations: Intact  Orientation:  Full (Time, Place, and Person)  Thought Content: Logical   Suicidal Thoughts:  No  Homicidal Thoughts:  No  Memory:  Immediate;   Fair Recent;   Fair Remote;   Fair  Judgement:  Fair  Insight:  Fair  Psychomotor Activity:  Normal  Concentration:  Concentration: Fair and Attention Span: Fair  Recall:  [employer] of Knowledge: Fair  Language: Fair  Akathisia:  No  Handed:  Right  AIMS (if indicated): not done  Assets:  Communication Skills Desire for Improvement Housing Social Support Transportation  ADL's:  Intact  Cognition: WNL  Sleep:  improving   Screenings: AIMS    Flowsheet Row Office Visit from 05/23/2023 in [facility] Office Visit from 11/01/2022 in [facility] Office Visit from 03/03/2022 in [facility] Office Visit from 01/20/2022 in [facility] Office Visit from 11/17/2021 in [facility]  AIMS Total Score 0 0  0 0 0      GAD-7    Flowsheet Row Office Visit from 05/23/2023 in [facility] Counselor from 05/01/2023 in [facility] Counselor from 11/07/2022 in [facility] Office Visit from 11/01/2022 in [facility] Office Visit from 04/07/2022 in [facility]  Total GAD-7 Score 17 15 21 20 18       PHQ2-9    Flowsheet Row Office Visit from 05/23/2023 in [facility] Counselor from 05/01/2023 in [facility] Counselor from 11/07/2022 in [facility] Office Visit from 11/01/2022 in [facility] Video Visit from 08/21/2022 in [facility]  PHQ-2 Total Score 4 4 6 5 1   PHQ-9 Total Score 16 9 20 18  --      Flowsheet Row Video Visit from 06/22/2023 in [facility] Office Visit from 05/23/2023 in [facility] Counselor from 05/01/2023 in [facility]  C-SSRS RISK CATEGORY Moderate Risk Moderate Risk Moderate Risk        Assessment and Plan: [name] is a 58 year old Caucasian female who has a history of PTSD, MDD, social anxiety, alcohol use disorder in remission, chronic pain was evaluated by telemedicine today.  Discussed assessment and plan as noted below.  Major depression, moderate-in partial remission PTSD-improving Social anxiety disorder-improving Obsessive-compulsive disorder unspecified-unstable Continues to have irritability, getting frustrated easily with or without triggers.  Currently compliant on the current medication regimen and reports recent medication adjustments has been  beneficial.  Sleep has improved on the current regimen.Currently motivated to continue to stay in EMDR therapy. Continue BuSpar  30 mg twice daily Continue Seroquel  75 mg daily at bedtime Continue Zoloft  200 mg daily Continue Hydroxyzine  25 mg twice a day as needed Continue EMDR with Ms. Almetta Jacquet  Restless leg symptoms-improving Currently reports sleep problems improved on the current medication regimen. Continue Ropinirole  1 mg at bedtime Continue Ambien  5 mg at bedtime as needed, rarely uses it.  Alcoholism in sustained remission Currently in remission since February 2022. Will reevaluate in future sessions.  I have reviewed notes per Ms. Kristina Perkins dated 06/11/2023-patient continues to be in EMDR therapy.  Follow-up Follow-up in clinic in 2 to 3 months or sooner if needed.  Collaboration of Care: Collaboration of Care: Referral or follow-up with counselor/therapist AEB I have reviewed notes per Ms. Perkins as noted above.  Patient/Guardian was advised Release of Information must be obtained prior to any record release in order to collaborate their care with an outside provider. Patient/Guardian was advised if they have not already done so to contact the registration department to sign all necessary forms in order for us  to release information regarding their care.   Consent: Patient/Guardian gives verbal consent for treatment and assignment of benefits for services provided during this visit. Patient/Guardian expressed understanding and agreed  to proceed.  This note was generated in part or whole with voice recognition software. Voice recognition is usually quite accurate but there are transcription errors that can and very often do occur. I apologize for any typographical errors that were not detected and corrected.     Treazure Nery, MD 06/22/2023, 10:29 AM

## 2023-06-25 MED FILL — Estradiol TD Patch Twice Weekly 0.1 MG/24HR: TRANSDERMAL | 28 days supply | Qty: 8 | Fill #2 | Status: CN

## 2023-06-26 ENCOUNTER — Ambulatory Visit: Admitting: Licensed Clinical Social Worker

## 2023-07-04 ENCOUNTER — Other Ambulatory Visit (INDEPENDENT_AMBULATORY_CARE_PROVIDER_SITE_OTHER)

## 2023-07-04 ENCOUNTER — Ambulatory Visit (INDEPENDENT_AMBULATORY_CARE_PROVIDER_SITE_OTHER): Admitting: Orthopaedic Surgery

## 2023-07-04 DIAGNOSIS — Z96651 Presence of right artificial knee joint: Secondary | ICD-10-CM
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
## 2023-07-05 ENCOUNTER — Other Ambulatory Visit: Payer: Self-pay

## 2023-07-05 ENCOUNTER — Other Ambulatory Visit: Payer: Self-pay | Admitting: Family Medicine

## 2023-07-05 DIAGNOSIS — M79604 Pain in right leg: Secondary | ICD-10-CM

## 2023-07-05 MED ORDER — FUROSEMIDE 40 MG PO TABS
40.0000 mg | ORAL_TABLET | Freq: Every day | ORAL | 0 refills | Status: DC
  Filled 2023-07-05 – 2023-09-24 (×2): qty 30, 30d supply, fill #0

## 2023-07-05 MED FILL — Estradiol TD Patch Twice Weekly 0.1 MG/24HR: TRANSDERMAL | 28 days supply | Qty: 8 | Fill #2 | Status: AC

## 2023-07-11 ENCOUNTER — Ambulatory Visit (INDEPENDENT_AMBULATORY_CARE_PROVIDER_SITE_OTHER): Admitting: Licensed Clinical Social Worker

## 2023-07-11 DIAGNOSIS — F4010 Social phobia, unspecified: Secondary | ICD-10-CM | POA: Diagnosis not present

## 2023-07-11 DIAGNOSIS — F3341 Major depressive disorder, recurrent, in partial remission: Secondary | ICD-10-CM

## 2023-07-11 DIAGNOSIS — F4310 Post-traumatic stress disorder, unspecified: Secondary | ICD-10-CM | POA: Diagnosis not present

## 2023-07-11 NOTE — Progress Notes (Signed)
 THERAPIST PROGRESS NOTE  Session Time: 8:01am-8:58am  Participation Level: Active  Behavioral Response: CasualAlertDepressed  Type of Therapy: Individual Therapy  Treatment Goals addressed:  Goal: STG: Reduce the negative impact trauma related symptoms have on social, occupational, and family functioning per pt self report 3 out of 5 sessions documented.                                         Goal: LTG: Reduction in intrusive event recollections, avoidance of event reminders, intense arousal, or disinterest in activities or relationships  as evidenced by pt self report 3 out of 5 sessions documented.                                             Goal: STG: Evania will identify internal and external stimuli that trigger PTSD symptoms  as evidenced by pt self report 3 out of 5 sessions documented.                                             Goal: STG: Hooria will acknowledge that healing from PTSD is a gradual process  as evidenced by pt self report 3 out of 5 sessions documented.                                             Goal: STG: Secilia will identify negative coping strategies that have been used to cope with the feelings associated with the trauma  as evidenced by pt self report 3 out of 5 sessions documented.                 ProgressTowards Goals: Progressing  Interventions: CBT, Supportive, and Reframing  Summary:  [name] is a 58 y.o. female who presents with symptoms of anxiety, depression, trauma. Patient identifies symptoms to include intrusive thoughts, reexperiencing, tearfulness, low mood, fatigue, difficulty sleeping, anxiety in large crowds. Pt was oriented times 5. Pt was cooperative and engaged. Pt denies SI/HI/AVH.   Patient utilized therapeutic space to process feelings of frustration related to her disability process.  Clinician and patient worked together to establish controllable factors and ways in which patient can reframe feelings  of worthlessness.  Patient identifies impact to include low mood, negative cognitions, anhedonia.  However, patient reports she has continued to get out of the house and complete necessary tasks.  Towards the end of session, patient was able to recall recent positive experiences and address ways in which she chooses to hold onto hope during this time.  Suicidal/Homicidal: Nowithout intent/plan  Therapist Response: Cln utilized active listening to create a safe space for patient to process recent life events. Assessed for safety, stressors, symptoms since last session.  Worked with patient through CBT on reframing negative cognitions and perspective regarding current life situations.    Plan: Return again in 3 weeks.  Diagnosis: PTSD (post-traumatic stress disorder)  MDD (major depressive disorder), recurrent, in partial remission (HCC)  Social anxiety disorder   Collaboration of Care: AEB psychiatrist can access notes and cln. Will review  psychiatrists' notes. Check in with the patient and will see LCSW per availability. Patient agreed with treatment recommendations.   Patient/Guardian was advised Release of Information must be obtained prior to any record release in order to collaborate their care with an outside provider. Patient/Guardian was advised if they have not already done so to contact the registration department to sign all necessary forms in order for us  to release information regarding their care.   Consent: Patient/Guardian gives verbal consent for treatment and assignment of benefits for services provided during this visit. Patient/Guardian expressed understanding and agreed to proceed.   Marvin Slot, LCSW 07/11/2023

## 2023-07-16 ENCOUNTER — Ambulatory Visit: Admitting: Orthopaedic Surgery

## 2023-07-18 ENCOUNTER — Other Ambulatory Visit: Payer: Self-pay

## 2023-07-19 ENCOUNTER — Encounter: Payer: Self-pay | Admitting: Orthopaedic Surgery

## 2023-07-20 ENCOUNTER — Other Ambulatory Visit: Payer: Self-pay | Admitting: Orthopaedic Surgery

## 2023-07-23 ENCOUNTER — Ambulatory Visit (INDEPENDENT_AMBULATORY_CARE_PROVIDER_SITE_OTHER): Admitting: Licensed Clinical Social Worker

## 2023-07-23 DIAGNOSIS — F3341 Major depressive disorder, recurrent, in partial remission: Secondary | ICD-10-CM

## 2023-07-23 DIAGNOSIS — F4310 Post-traumatic stress disorder, unspecified: Secondary | ICD-10-CM

## 2023-07-23 DIAGNOSIS — F4010 Social phobia, unspecified: Secondary | ICD-10-CM | POA: Diagnosis not present

## 2023-07-23 NOTE — Progress Notes (Signed)
 THERAPIST PROGRESS NOTE  Progress Review  Session Time: 9:03am-9:55am  Participation Level: Active  Behavioral Response: CasualAlertAnxious  Type of Therapy: Individual Therapy  Treatment Goals addressed:  Active     BH CCP Acute or Chronic Trauma Reaction     LTG: Recall traumatic events without becoming overwhelmed with negative emotions (Progressing)     Start:  05/01/23    Expected End:  07/01/23       Goal Note     07/23/23: 60% progress; Shares EMDR has helped a lot stating I have tools I use to get my mind of that or I sit outside and listen to the birds and stuff and put it away. Reports she used to feel overwhelmed and wanted to shut down but not so much anymore.          STG: Reduce the negative impact trauma related symptoms have on social, occupational, and family functioning per pt self-report 3 out of 5 sessions documented.   (Progressing)     Start:  05/01/23    Expected End:  07/01/23      05/01/23: 50% progress I have to push myself, but I know I got to get it done.     Goal Note     07/23/23: 50% progress; I have gone into stores that I have not been in in a while. Reports slight anxiety and hypervigilance but my heart wasn't pounding out my chest like usual.          LTG:  Reduction in intrusive event recollections, avoidance of event reminders, intense arousal, or disinterest in activities or relationships  as evidenced by pt self report 3 out of 5 sessions documented.   (Completed/Met)     Start:  05/01/23    Expected End:  07/01/23    Resolved:  07/23/23   05/01/23: 50% progress I still have them quite often (2-3 a week) but reports she was experiencing them daily.     Goal Note     07/23/23: Reports she limits her social exposure to her family and best friend. Shares she is ok with this pattern and feels this is a safe way to ensure she has the support she needs to cope with stressors.          STG - Misc 1 (Completed/Met)      Start:  05/01/23    Expected End:  07/01/23    Resolved:  07/23/23   Goal: STG: Danyle will identify internal and external stimuli that trigger PTSD symptoms  as evidenced by pt self report 3 out of 5 sessions documented.      05/01/23: 35% progress I try to avoid conflicts or in public and someone is having a fuss or screaming children.  Identifies she is able to utilize her coping skills to calm down.        STG: STG: Nashay will acknowledge that healing from PTSD is a gradual process  as evidenced by pt self report 3 out of 5 sessions documented.   (Completed/Met)     Start:  05/01/23    Expected End:  07/01/23    Resolved:  05/01/23         05/01/23: 100% progress It's a real slow process for me.        STG:  Emelda will identify negative coping strategies that have been used to cope with the feelings associated with the trauma  as evidenced by pt self report 3 out of 5 sessions documented.   (  Progressing)     Start:  05/01/23    Expected End:  07/01/23      05/01/23: 50% progress My unhealthy was drinking all the time and with being sober 3 years and a couple of months. Acknowledged replacement and use of healthier coping skills. Denies feeling comfortable joining AA but shares she can reach out to her support system for words of encouragement.     Goal Note     07/23/23: 60% progress; She reports she has been able to identify and reframe negative cognitions.          Provide and outline the treatment process to Latrina, explaining that it will include a gradual processing of the details and feelings associated with the trauma and developing new, more appropriate coping strategies     Start:  05/01/23         Cooperate with trauma-focused psychotherapy techniques to reduce emotional reaction to the traumatic event      Start:  05/01/23         Teach Shanisha coping strategies (e.g., writing down thoughts and feelings in a journal; taking deep, slow breaths; calling a  support person to talk about memories) to deal with trauma memories and sudden emotional reactions without becoming emotionally n     Start:  05/01/23            ProgressTowards Goals: Progressing  Interventions: Strength-based, Supportive, and Other: EMDR  Summary: Chelcey Caputo is a 58 y.o. female who presents with symptoms of anxiety, depression, trauma. Patient identifies symptoms to include intrusive thoughts, reexperiencing, tearfulness, low mood, fatigue, difficulty sleeping, anxiety in large crowds. Pt was oriented times 5. Pt was cooperative and engaged. Pt denies SI/HI/AVH.   Patient reports when she received the news about her disability process she experienced a passive suicidal thoughts but denies intent or plan.  Reports this was around 2 weeks ago.  The patient reported feelings of frustration and irritability due to the disability process. She expressed concern about the outcome and indicated that she hasn't wanted to engage in activities, remaining at home instead. Despite these situational stressors, she noted a marked improvement in her ability to utilize coping skills for maintaining emotional regulation and managing triggers related to her trauma history.  The GAD-7 and PHQ-9 assessments were readministered. The patient's PHQ-9 score increased from 9 to 10, while her GAD-7 score decreased from 15 to 10. Additionally, her PCL score decreased from 45 to 22.  The clinician reviewed and updated the patient's treatment plan as documented above.  During the session, EMDR processing continued on a new memory within her target sequence related to the belief I cannot trust others. The clinician and the patient worked on desensitizing this memory, with the patient reporting a subjective units of distress (SUD) score of 6, which decreased to 2 by the end of the session. The clinician and patient concluded the session due to incomplete processing, with the patient visiting her  peaceful place to assist in returning to baseline. For the next session, the clinician and patient will continue processing this memory through EMDR.  Suicidal/Homicidal: Yes Without intent or PLan  Therapist Response:  Cln utilized active listening to create a safe space for patient to process recent life events. Assessed for safety, stressors, symptoms since last session. Provided pscyhoeducation on safety behaviors with social anxiety.  Clinician utilized the beginning of session to reflect on patient's progress in therapy through readministration of symptoms screeners and review of her treatment plan. Utilized  EMDR to continue to work through trauma memories and reframe negative cognitions.   Plan: Return again in 2 weeks.  Diagnosis: PTSD (post-traumatic stress disorder)  MDD (major depressive disorder), recurrent, in partial remission (HCC)  Social anxiety disorder   Collaboration of Care: AEB psychiatrist can access notes and cln. Will review psychiatrists' notes. Check in with the patient and will see LCSW per availability. Patient agreed with treatment recommendations.   Patient/Guardian was advised Release of Information must be obtained prior to any record release in order to collaborate their care with an outside provider. Patient/Guardian was advised if they have not already done so to contact the registration department to sign all necessary forms in order for us  to release information regarding their care.   Consent: Patient/Guardian gives verbal consent for treatment and assignment of benefits for services provided during this visit. Patient/Guardian expressed understanding and agreed to proceed.   [name] Husband, LCSW 07/23/2023

## 2023-07-29 ENCOUNTER — Other Ambulatory Visit: Payer: Self-pay | Admitting: Family Medicine

## 2023-07-30 ENCOUNTER — Other Ambulatory Visit: Payer: Self-pay

## 2023-07-30 ENCOUNTER — Other Ambulatory Visit (HOSPITAL_BASED_OUTPATIENT_CLINIC_OR_DEPARTMENT_OTHER): Payer: Self-pay

## 2023-07-30 ENCOUNTER — Ambulatory Visit (INDEPENDENT_AMBULATORY_CARE_PROVIDER_SITE_OTHER): Admitting: Family Medicine

## 2023-07-30 VITALS — BP 110/82 | HR 80 | Temp 98.1°F | Resp 18 | Ht 62.0 in | Wt 154.8 lb

## 2023-07-30 DIAGNOSIS — R21 Rash and other nonspecific skin eruption: Secondary | ICD-10-CM

## 2023-07-30 DIAGNOSIS — R42 Dizziness and giddiness: Secondary | ICD-10-CM | POA: Diagnosis not present

## 2023-07-30 DIAGNOSIS — M4802 Spinal stenosis, cervical region: Secondary | ICD-10-CM

## 2023-07-30 MED ORDER — ESTRADIOL 0.1 MG/24HR TD PTTW
1.0000 | MEDICATED_PATCH | TRANSDERMAL | 2 refills | Status: DC
  Filled 2023-07-30: qty 8, 28d supply, fill #0
  Filled 2023-09-24: qty 8, 28d supply, fill #1
  Filled 2023-10-19: qty 8, 28d supply, fill #2

## 2023-07-30 MED ORDER — CLOTRIMAZOLE-BETAMETHASONE 1-0.05 % EX CREA
1.0000 | TOPICAL_CREAM | Freq: Every day | CUTANEOUS | 0 refills | Status: DC
  Filled 2023-07-30: qty 30, 30d supply, fill #0

## 2023-07-30 MED ORDER — CLOTRIMAZOLE-BETAMETHASONE 1-0.05 % EX CREA
1.0000 | TOPICAL_CREAM | Freq: Every day | CUTANEOUS | 0 refills | Status: AC
  Filled 2023-07-30: qty 30, 30d supply, fill #0

## 2023-07-30 NOTE — Progress Notes (Signed)
 Established Patient Office Visit  Subjective   Patient ID: [name], female    DOB: [birth year]  Age: 58 y.o. MRN: [id number]  Chief Complaint  Patient presents with   Dizziness   Rash    Pt states having rash on shins, x3 days, pt states having a fall and landed in the grass. No pain or itchiness    HPI Discussed the use of AI scribe software for clinical note transcription with the patient, who gave verbal consent to proceed.  History of Present Illness [name] is a 58 year old female who presents with dizziness and frequent falls.  She experiences persistent dizziness, particularly when looking up or bending her neck back, leading to three falls in the past year. She has consulted multiple specialists, including those at [facility], [facility], and Dr. Georgina at [facility]. Surgery at the top of her spine was suggested to relieve arthritis, but she has opted against it. Incidents of falling occurred while assisting her father and during a fireworks display.  She has lower back pain and leg swelling, which worsens by the end of the day. Despite being on a diuretic, the swelling persists. She also experiences shortness of breath when walking short distances, which began six to eight months ago. Her history of gastric bypass limits her use of anti-inflammatory medications, and she takes Tylenol  Arthritis three times daily.  She experienced a hypoglycemic episode with a blood sugar reading of 62 mg/dL around midday despite having breakfast. Her father is diabetic, prompting her to check her blood sugar. She also reports low energy levels and a lack of desire to engage in activities, attributing this to possible long COVID effects, having had COVID-19 three times.  She has a non-itchy, non-burning rash, suspected to be related to grass exposure. The rash is not raised and does not cause discomfort. She has applied lotion for sunburn and insect bites, but the cause  remains unclear.  Her family history includes her mother having a stroke at 21, which concerns her due to her own symptoms of swelling and occasional sore spots on her head and neck.   Patient Active Problem List   Diagnosis Date Noted   Obsessive-compulsive disorder 05/23/2023   Status post total right knee replacement 12/12/2022   Cervical stenosis of spine 08/04/2022   At risk for prolonged QT interval syndrome 10/19/2021   PTSD (post-traumatic stress disorder) 04/04/2021   MDD (major depressive disorder), recurrent episode, moderate (HCC) 04/04/2021   Social anxiety disorder 04/04/2021   Dizziness 11/04/2020   Epistaxis 11/04/2020   Chest pain of uncertain etiology 11/04/2020   Anxiety 09/23/2020   Attention and concentration deficit 09/23/2020   Degenerative spondylolisthesis 04/05/2020   Low back pain with radiation 02/06/2020   Bilateral lower extremity edema 04/07/2019   Pain in both lower extremities 04/07/2019   Numbness in both hands 04/07/2019   Pain in right arm 04/07/2019   Recurrent urinary tract infection 10/24/2018   Menopausal syndrome 10/24/2018   Abnormal mammogram 10/24/2018   Abdominal pain, epigastric    Marginal ulcers    Melena 07/10/2018   Alcohol use disorder, severe, in sustained remission (HCC) 07/10/2018   Encounter for screening for HIV 07/10/2018   Asthma 07/10/2018   Exposure to COVID-19 virus 05/28/2018   GERD (gastroesophageal reflux disease) 05/28/2018   Free intraperitoneal air 03/08/2018   Perforated ulcer (HCC) 03/08/2018   Cervical radiculopathy at C7 02/22/2018   Essential hypertension 08/02/2017   Hematoma of hip, left,  initial encounter 06/28/2017   Neck pain 06/28/2017   Nonallopathic lesion of cervical region 06/28/2017   Nonallopathic lesion of thoracic region 06/28/2017   Nonallopathic lesion of lumbar region 06/28/2017   Biomechanical lesion, unspecified 06/28/2017   Muscle spasm 04/20/2017   URI (upper respiratory  infection) 10/02/2016   Preventative health care 05/01/2016   Patellofemoral arthritis of left knee 05/04/2015   Cough 09/02/2013   Acute pharyngitis 09/02/2013   Sinusitis 06/25/2013   Dog bite of face 04/16/2013   Gastroenteritis 04/16/2013   Allergic rhinitis 04/16/2013   Mass of soft tissue 08/02/2012   RLS (restless legs syndrome) 01/21/2009   LYMPHOPENIA 03/23/2008   Depression 02/27/2008   HEMANGIOMA, SKIN 02/04/2008   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 02/19/2007   Past Medical History:  Diagnosis Date   Allergy    Anemia    Anxiety    Arthritis    Asthma    Depression    GERD (gastroesophageal reflux disease)    Heart murmur    from childhood- pt had ECHO 12/27/21   Hiatal hernia    Hypertension    Peptic ulcer    PTSD (post-traumatic stress disorder)    UTI (urinary tract infection)    Past Surgical History:  Procedure Laterality Date   ABDOMINAL HYSTERECTOMY     fibroids   BIOPSY  07/11/2018   Procedure: BIOPSY;  Surgeon: Aneita Gwendlyn DASEN, MD;  Location: [facility] ENDOSCOPY;  Service: Endoscopy;;   BREAST BIOPSY Left    COLONOSCOPY  2014   ESOPHAGOGASTRODUODENOSCOPY (EGD) WITH PROPOFOL  N/A 07/11/2018   Procedure: ESOPHAGOGASTRODUODENOSCOPY (EGD) WITH PROPOFOL ;  Surgeon: Aneita Gwendlyn DASEN, MD;  Location: [facility] ENDOSCOPY;  Service: Endoscopy;  Laterality: N/A;   GASTRIC BYPASS  2007   HEMOSTASIS CLIP PLACEMENT  07/11/2018   Procedure: HEMOSTASIS CLIP PLACEMENT;  Surgeon: Aneita Gwendlyn DASEN, MD;  Location: [facility] ENDOSCOPY;  Service: Endoscopy;;   LAPAROTOMY N/A 03/08/2018   Procedure: EXPLORATORY LAPAROTOMY, REPAIR AND PATCH CLOSURE OF PERFORATED ULCER;  Surgeon: Sebastian Moles, MD;  Location: [facility] OR;  Service: General;  Laterality: N/A;   REDUCTION MAMMAPLASTY Bilateral    TONSILLECTOMY     and adenoids removed as a child   TOTAL KNEE ARTHROPLASTY Right 12/12/2022   Procedure: RIGHT TOTAL KNEE ARTHROPLASTY;  Surgeon: Vernetta Lonni GRADE, MD;  Location: [facility] OR;   Service: Orthopedics;  Laterality: Right;   TUBAL LIGATION     Social History   Tobacco Use   Smoking status: Former    Current packs/day: 0.00    Types: Cigarettes    Quit date: 01/24/1987    Years since quitting: 36.5   Smokeless tobacco: Never  Vaping Use   Vaping status: Former   Substances: Flavoring   Devices: has vaped with flavor but no nicotine. Been 8 years  Substance Use Topics   Alcohol use: Not Currently    Comment: pt has not drank alcohol since 02/2020   Drug use: No   Social History   Socioeconomic History   Marital status: Married    Spouse name: mark   Number of children: 0   Years of education: Not on file   Highest education level: 12th grade  Occupational History    Employer: [employer]    Comment: [employer]  Tobacco Use   Smoking status: Former    Current packs/day: 0.00    Types: Cigarettes    Quit date: 01/24/1987    Years since quitting: 36.5   Smokeless tobacco: Never  Vaping Use   Vaping status: Former   Substances: Flavoring   Devices: has vaped with flavor but no nicotine. Been 8 years  Substance and Sexual Activity   Alcohol use: Not Currently    Comment: pt has not drank alcohol since 02/2020   Drug use: No   Sexual activity: Not Currently  Other Topics Concern   Not on file  Social History Narrative   Regular exercise--no (because of knees)   Social Drivers of Health   Financial Resource Strain: Low Risk  (07/30/2023)   Overall Financial Resource Strain (CARDIA)    Difficulty of Paying Living Expenses: Not very hard  Food Insecurity: No Food Insecurity (07/30/2023)   Hunger Vital Sign    Worried About Running Out of Food in the Last Year: Never true    Ran Out of Food in the Last Year: Never true  Transportation Needs: No Transportation Needs (07/30/2023)   PRAPARE - Administrator, Civil Service (Medical): No    Lack of Transportation (Non-Medical): No  Physical Activity: Inactive (07/30/2023)   Exercise Vital Sign     Days of Exercise per Week: 0 days    Minutes of Exercise per Session: Not on file  Stress: Stress Concern Present (07/30/2023)   [employer] of Occupational Health - Occupational Stress Questionnaire    Feeling of Stress: Rather much  Social Connections: Moderately Integrated (07/30/2023)   Social Connection and Isolation Panel    Frequency of Communication with Friends and Family: More than three times a week    Frequency of Social Gatherings with Friends and Family: More than three times a week    Attends Religious Services: 1 to 4 times per year    Active Member of [employer] or Organizations: No    Attends Engineer, structural: Not on file    Marital Status: Married  Catering manager Violence: Not At Risk (12/12/2022)   Humiliation, Afraid, Rape, and Kick questionnaire    Fear of Current or Ex-Partner: No    Emotionally Abused: No    Physically Abused: No    Sexually Abused: No   Family Status  Relation Name Status   Mother  Deceased   Father  Alive   Sister  Alive   Brother  Alive   MGM  (Not Specified)   MGF  (Not Specified)   PGM  (Not Specified)   PGF  (Not Specified)   Neg Hx  (Not Specified)  No partnership data on file   Family History  Problem Relation Age of Onset   Stroke Mother    Alzheimer's disease Mother    Hypertension Father    Cancer Father        Prostate   Dementia Father    Alcohol abuse Sister    Depression Sister    Alcohol abuse Brother    Depression Brother    Diabetes Maternal Grandmother    Alzheimer's disease Maternal Grandmother    Diabetes Maternal Grandfather    Liver disease Maternal Grandfather    Cancer Maternal Grandfather        Prostate   Diabetes Paternal Grandmother    Alzheimer's disease Paternal Grandmother    Diabetes Paternal Grandfather    Cancer Paternal Grandfather        Lung, Prostate   Colon cancer Neg Hx    Esophageal cancer Neg Hx    Pancreatic cancer Neg Hx    Stomach cancer Neg Hx    Colon  polyps Neg Hx  Rectal cancer Neg Hx    Allergies  Allergen Reactions   Lamictal  [Lamotrigine ] Rash   Penicillins Other (See Comments)    Did it involve swelling of the face/tongue/throat, SOB, or low BP? Unk Did it involve sudden or severe rash/hives, skin peeling, or any reaction on the inside of your mouth or nose? Unk Did you need to seek medical attention at a hospital or doctor's office? Unk When did it last happen? Was 58 years old; reaction not recalled     If all above answers are NO, may proceed with cephalosporin use.    Codeine  Itching    Crazy dreams and itching-pt said she can take it with food      Review of Systems  Constitutional:  Negative for chills, fever and malaise/fatigue.  HENT:  Negative for congestion and hearing loss.   Eyes:  Negative for blurred vision and discharge.  Respiratory:  Negative for cough, sputum production and shortness of breath.   Cardiovascular:  Negative for chest pain, palpitations and leg swelling.  Gastrointestinal:  Negative for abdominal pain, blood in stool, constipation, diarrhea, heartburn, nausea and vomiting.  Genitourinary:  Negative for dysuria, frequency, hematuria and urgency.  Musculoskeletal:  Positive for neck pain. Negative for back pain, falls and myalgias.  Skin:  Positive for rash.  Neurological:  Positive for dizziness and headaches. Negative for sensory change, loss of consciousness and weakness.  Endo/Heme/Allergies:  Negative for environmental allergies. Does not bruise/bleed easily.  Psychiatric/Behavioral:  Negative for depression and suicidal ideas. The patient is not nervous/anxious and does not have insomnia.       Objective:     BP 110/82 (BP Location: Left Arm, Patient Position: Sitting, Cuff Size: Normal)   Pulse 80   Temp 98.1 F (36.7 C) (Oral)   Resp 18   Ht 5' 2 (1.575 m)   Wt 154 lb 12.8 oz (70.2 kg)   SpO2 99%   BMI 28.31 kg/m  BP Readings from Last 3 Encounters:  07/30/23 110/82   05/15/23 120/66  01/09/23 110/70   Wt Readings from Last 3 Encounters:  07/30/23 154 lb 12.8 oz (70.2 kg)  05/15/23 156 lb (70.8 kg)  01/09/23 150 lb 4 oz (68.2 kg)   SpO2 Readings from Last 3 Encounters:  07/30/23 99%  05/15/23 100%  12/18/22 96%      Physical Exam Vitals and nursing note reviewed.  Constitutional:      General: She is not in acute distress.    Appearance: Normal appearance. She is well-developed.  HENT:     Head: Normocephalic and atraumatic.  Eyes:     General: No scleral icterus.       Right eye: No discharge.        Left eye: No discharge.  Cardiovascular:     Rate and Rhythm: Normal rate and regular rhythm.     Heart sounds: No murmur heard. Pulmonary:     Effort: Pulmonary effort is normal. No respiratory distress.     Breath sounds: Normal breath sounds.  Musculoskeletal:        General: Tenderness present.     Cervical back: Neck supple. Spasms and tenderness present. Pain with movement present. Decreased range of motion.     Right lower leg: No edema.     Left lower leg: No edema.     Comments: Dizziness with movement of head   Skin:    General: Skin is warm and dry.  Neurological:     Mental Status:  She is alert and oriented to person, place, and time.  Psychiatric:        Mood and Affect: Mood normal.        Behavior: Behavior normal.        Thought Content: Thought content normal.        Judgment: Judgment normal.      No results found for any visits on 07/30/23.  Last CBC Lab Results  Component Value Date   WBC 6.1 12/18/2022   HGB 10.5 (L) 12/18/2022   HCT 30.6 (L) 12/18/2022   MCV 92.4 12/18/2022   MCH 30.3 12/13/2022   RDW 12.3 12/18/2022   PLT 298.0 12/18/2022   Last metabolic panel Lab Results  Component Value Date   GLUCOSE 134 (H) 12/13/2022   NA 135 12/13/2022   K 3.4 (L) 12/13/2022   CL 102 12/13/2022   CO2 27 12/13/2022   BUN 8 12/13/2022   CREATININE 0.88 12/13/2022   GFRNONAA >60 12/13/2022    CALCIUM 8.4 (L) 12/13/2022   PHOS 3.8 01/29/2009   PROT 6.1 (L) 12/05/2022   ALBUMIN 3.5 12/05/2022   LABGLOB 2.1 08/27/2018   AGRATIO 1.9 08/27/2018   BILITOT 0.5 12/05/2022   ALKPHOS 72 12/05/2022   AST 34 12/05/2022   ALT 34 12/05/2022   ANIONGAP 6 12/13/2022   Last lipids Lab Results  Component Value Date   CHOL 174 04/07/2022   HDL 66 04/07/2022   LDLCALC 92 04/07/2022   TRIG 69 04/07/2022   CHOLHDL 2.6 04/07/2022   Last hemoglobin A1c Lab Results  Component Value Date   HGBA1C 5.3 05/24/2022   Last thyroid  functions Lab Results  Component Value Date   TSH 3.15 08/04/2022   Last vitamin D  Lab Results  Component Value Date   VD25OH 52.80 11/14/2021   Last vitamin B12 and Folate Lab Results  Component Value Date   VITAMINB12 755 08/04/2022   FOLATE >23.7 04/07/2019      The 10-year ASCVD risk score (Arnett DK, et al., 2019) is: 2%    Assessment & Plan:   Problem List Items Addressed This Visit       Unprioritized   Dizziness   Relevant Orders   CBC with Differential/Platelet   Comprehensive metabolic panel with GFR   Hemoglobin A1c   Lipid panel   Vitamin B12   VITAMIN D  25 Hydroxy (Vit-D Deficiency, Fractures)   Protime-INR   PTT   Cervical stenosis of spine - Primary   Relevant Orders   Ambulatory referral to Neurosurgery   CBC with Differential/Platelet   Comprehensive metabolic panel with GFR   Hemoglobin A1c   Lipid panel   Vitamin B12   VITAMIN D  25 Hydroxy (Vit-D Deficiency, Fractures)   Other Visit Diagnoses       Rash       Relevant Medications   clotrimazole -betamethasone  (LOTRISONE ) cream     Assessment and Plan Assessment & Plan Cervical Spine Arthritis   Chronic neck pain and dizziness are attributed to cervical spine arthritis. She declined surgery involving partial skull bone removal due to its risks. Dizziness worsens with neck extension. Post-gastric bypass, she cannot take anti-inflammatory medications. Refer to  [facility] neurosurgery for further evaluation and obtain an MRI on disc from [facility] for the consultation.  Shortness of Breath   She experiences shortness of breath during short-distance walking for six to eight months. Gastric bypass surgery and reduced exercise capacity may contribute to deconditioning. Cardiological evaluation and recent EKGs are normal. Consider  further evaluation if symptoms persist or worsen.  Edema   Leg and ankle swelling occurs, especially after prolonged standing. Despite being on a diuretic, swelling persists. She should double the diuretic dose for two to three days if swelling worsens.  Hypoglycemia   She had a hypoglycemic episode with blood sugar at 62 mg/dL around midday despite breakfast. There is a family history of diabetes, but she is not diagnosed with it. Monitor blood sugar levels and dietary intake to identify potential triggers.  Rash   A non-itchy, non-burning rash on her lower extremities may be related to grass exposure. The rash is non-raised, blanches on pressure, and resembles vasculitis. Prescribe a topical cream for the rash and follow up if it does not resolve.  Fatigue and Low Energy   Persistent fatigue and low energy may be related to long COVID or post-gastric bypass deconditioning. She takes vitamin B12 and B complex supplements. Discussed potential use of natural supplements like curcumin, bromelain, and nattokinase for long COVID symptoms. Consider these supplements and reassess if symptoms persist.  General Health Maintenance   She is maintaining sobriety and managing health post-gastric bypass surgery. Encourage continued sobriety and healthy lifestyle choices.  Follow-up   She is planning a vacation and will follow up on medical issues upon return. Ensure the MRI is sent to [facility] neurosurgery before the consultation and follow up with them after the vacation.    No follow-ups on file.    Tony Friscia R Lowne Chase, DO

## 2023-07-31 ENCOUNTER — Ambulatory Visit: Payer: Self-pay | Admitting: Family Medicine

## 2023-07-31 ENCOUNTER — Ambulatory Visit (INDEPENDENT_AMBULATORY_CARE_PROVIDER_SITE_OTHER): Admitting: Licensed Clinical Social Worker

## 2023-07-31 DIAGNOSIS — F4010 Social phobia, unspecified: Secondary | ICD-10-CM

## 2023-07-31 DIAGNOSIS — F3341 Major depressive disorder, recurrent, in partial remission: Secondary | ICD-10-CM | POA: Diagnosis not present

## 2023-07-31 DIAGNOSIS — F4310 Post-traumatic stress disorder, unspecified: Secondary | ICD-10-CM | POA: Diagnosis not present

## 2023-07-31 LAB — COMPREHENSIVE METABOLIC PANEL WITH GFR
ALT: 30 U/L (ref 0–35)
AST: 28 U/L (ref 0–37)
Albumin: 4.7 g/dL (ref 3.5–5.2)
Alkaline Phosphatase: 81 U/L (ref 39–117)
BUN: 27 mg/dL — ABNORMAL HIGH (ref 6–23)
CO2: 31 meq/L (ref 19–32)
Calcium: 9.8 mg/dL (ref 8.4–10.5)
Chloride: 102 meq/L (ref 96–112)
Creatinine, Ser: 0.86 mg/dL (ref 0.40–1.20)
GFR: 74.52 mL/min (ref >60.00)
Glucose, Bld: 98 mg/dL (ref 70–99)
Potassium: 3.9 meq/L (ref 3.5–5.1)
Sodium: 142 meq/L (ref 135–145)
Total Bilirubin: 0.4 mg/dL (ref 0.2–1.2)
Total Protein: 7.1 g/dL (ref 6.0–8.3)

## 2023-07-31 LAB — LIPID PANEL
Cholesterol: 238 mg/dL — ABNORMAL HIGH (ref 0–200)
HDL: 70.8 mg/dL (ref >39.00)
LDL Cholesterol: 145 mg/dL — ABNORMAL HIGH (ref 0–99)
NonHDL: 166.84
Total CHOL/HDL Ratio: 3
Triglycerides: 109 mg/dL (ref 0.0–149.0)
VLDL: 21.8 mg/dL (ref 0.0–40.0)

## 2023-07-31 LAB — CBC WITH DIFFERENTIAL/PLATELET
Basophils Absolute: 0 K/uL (ref 0.0–0.1)
Basophils Relative: 0.6 % (ref 0.0–3.0)
Eosinophils Absolute: 0.2 K/uL (ref 0.0–0.7)
Eosinophils Relative: 3.9 % (ref 0.0–5.0)
HCT: 44.9 % (ref 36.0–46.0)
Hemoglobin: 15.3 g/dL — ABNORMAL HIGH (ref 12.0–15.0)
Lymphocytes Relative: 29.4 % (ref 12.0–46.0)
Lymphs Abs: 1.8 K/uL (ref 0.7–4.0)
MCHC: 34 g/dL (ref 30.0–36.0)
MCV: 90.8 fl (ref 78.0–100.0)
Monocytes Absolute: 0.4 K/uL (ref 0.1–1.0)
Monocytes Relative: 6.6 % (ref 3.0–12.0)
Neutro Abs: 3.7 K/uL (ref 1.4–7.7)
Neutrophils Relative %: 59.5 % (ref 43.0–77.0)
Platelets: 245 K/uL (ref 150.0–400.0)
RBC: 4.95 Mil/uL (ref 3.87–5.11)
RDW: 13.3 % (ref 11.5–15.5)
WBC: 6.2 K/uL (ref 4.0–10.5)

## 2023-07-31 LAB — VITAMIN D 25 HYDROXY (VIT D DEFICIENCY, FRACTURES): VITD: 74.47 ng/mL (ref 30.00–100.00)

## 2023-07-31 LAB — VITAMIN B12: Vitamin B-12: >1500 pg/mL — ABNORMAL HIGH (ref 211–911)

## 2023-07-31 LAB — HEMOGLOBIN A1C: Hgb A1c MFr Bld: 5.3 % (ref 4.6–6.5)

## 2023-07-31 NOTE — Progress Notes (Signed)
 THERAPIST PROGRESS NOTE  Session Time: 8:03am-8:47am  Participation Level: Active  Behavioral Response: CasualAlertEuthymic  Type of Therapy: Individual Therapy  Treatment Goals addressed:  Active     BH CCP Acute or Chronic Trauma Reaction     LTG: Recall traumatic events without becoming overwhelmed with negative emotions (Progressing)     Start:  05/01/23    Expected End:  07/01/23       Goal Note     07/23/23: 60% progress; Shares EMDR has helped a lot stating I have tools I use to get my mind of that or I sit outside and listen to the birds and stuff and put it away. Reports she used to feel overwhelmed and wanted to shut down but not so much anymore.          STG: Reduce the negative impact trauma related symptoms have on social, occupational, and family functioning per pt self-report 3 out of 5 sessions documented.   (Progressing)     Start:  05/01/23    Expected End:  07/01/23      05/01/23: 50% progress I have to push myself, but I know I got to get it done.     Goal Note     07/23/23: 50% progress; I have gone into stores that I have not been in in a while. Reports slight anxiety and hypervigilance but my heart wasn't pounding out my chest like usual.          LTG:  Reduction in intrusive event recollections, avoidance of event reminders, intense arousal, or disinterest in activities or relationships  as evidenced by pt self report 3 out of 5 sessions documented.   (Completed/Met)     Start:  05/01/23    Expected End:  07/01/23    Resolved:  07/23/23   05/01/23: 50% progress I still have them quite often (2-3 a week) but reports she was experiencing them daily.     Goal Note     07/23/23: Reports she limits her social exposure to her family and best friend. Shares she is ok with this pattern and feels this is a safe way to ensure she has the support she needs to cope with stressors.          STG - Misc 1 (Completed/Met)     Start:  05/01/23     Expected End:  07/01/23    Resolved:  07/23/23   Goal: STG: [name] will identify internal and external stimuli that trigger PTSD symptoms  as evidenced by pt self report 3 out of 5 sessions documented.      05/01/23: 35% progress I try to avoid conflicts or in public and someone is having a fuss or screaming children.  Identifies she is able to utilize her coping skills to calm down.        STG: STG: [name] will acknowledge that healing from PTSD is a gradual process  as evidenced by pt self report 3 out of 5 sessions documented.   (Completed/Met)     Start:  05/01/23    Expected End:  07/01/23    Resolved:  05/01/23         05/01/23: 100% progress It's a real slow process for me.        STG:  [name] will identify negative coping strategies that have been used to cope with the feelings associated with the trauma  as evidenced by pt self report 3 out of 5 sessions documented.   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 Acknowledged replacement and use of healthier coping skills. Denies feeling comfortable joining AA but shares she can reach out to her support system for words of encouragement.     Goal Note     07/23/23: 60% progress; She reports she has been able to identify and reframe negative cognitions.          Provide and outline the treatment process to [name], explaining that it will include a gradual processing of the details and feelings associated with the trauma and developing new, more appropriate coping strategies     Start:  05/01/23         Cooperate with trauma-focused psychotherapy techniques to reduce emotional reaction to the traumatic event      Start:  05/01/23         Teach [name] coping strategies (e.g., writing down thoughts and feelings in a journal; taking deep, slow breaths; calling a support person to talk  about memories) to deal with trauma memories and sudden emotional reactions without becoming emotionally n     Start:  05/01/23            ProgressTowards Goals: Progressing  Interventions: Supportive, Reframing, and Other: EMDR  Summary: [name] is a 58 y.o. female who presents with symptoms of anxiety, depression, trauma. Patient identifies symptoms to include intrusive thoughts, reexperiencing, tearfulness, low mood, fatigue, difficulty sleeping, anxiety in large crowds. Pt was oriented times 5. Pt was cooperative and engaged. Pt denies SI/HI/AVH.   The patient presented for today's session reporting varying degrees of improvement in her anxiety and depression, attributing these changes to a series of stressful events that have occurred since her last visit. However, she noted progress in her ability to identify triggers, utilize coping skills, and engage in supportive conversations with her support system. The patient described her symptoms as including re-experiencing traumatic events and experiencing anxiety. She denied having any suicidal thoughts, intent, plans, or access to means.  During the session, the patient continued with Eye Movement Desensitization and Reprocessing (EMDR) therapy, focusing on processing a previous traumatic memory. She identified feelings of powerlessness and being unheard during this past encounter, which contributed to a sense of being damaged. Through the EMDR process, she was able to reduce her subjective units of distress from a score of 5 to a score of 0. The patient embraced the belief I am worthy. She expressed feelings of relief and became tearful as she reflected on her improvements in coping with her trauma history. She stated, I am taking my power back, and it feels good.     Suicidal/Homicidal: Nowithout intent/plan  Therapist Response: Cln utilized active listening to create a safe space for patient to process recent life events.  Assessed for safety, stressors, symptoms since last session. Provided pscyhoeducation on safety behaviors with social anxiety.  Clinician utilized EMDR to continue to work through trauma memories and reframe negative cognitions related to the patient trauma history.     Plan: Return again in 2 weeks.  Diagnosis: PTSD (post-traumatic stress disorder)  MDD (major depressive disorder), recurrent, in partial remission (HCC)  Social anxiety disorder   Collaboration of Care: AEB psychiatrist can access notes and cln. Will review psychiatrists' notes. Check in with the patient and will see LCSW per availability. Patient agreed with treatment  recommendations.   Patient/Guardian was advised Release of Information must be obtained prior to any record release in order to collaborate their care with an outside provider. Patient/Guardian was advised if they have not already done so to contact the registration department to sign all necessary forms in order for us  to release information regarding their care.   Consent: Patient/Guardian gives verbal consent for treatment and assignment of benefits for services provided during this visit. Patient/Guardian expressed understanding and agreed to proceed.   Mary KATHEE Husband, LCSW 07/31/2023

## 2023-08-01 ENCOUNTER — Ambulatory Visit (INDEPENDENT_AMBULATORY_CARE_PROVIDER_SITE_OTHER): Payer: Medicaid Other | Admitting: Orthopaedic Surgery

## 2023-08-01 ENCOUNTER — Encounter: Payer: Self-pay | Admitting: Orthopaedic Surgery

## 2023-08-01 DIAGNOSIS — Z96651 Presence of right artificial knee joint: Secondary | ICD-10-CM | POA: Diagnosis not present

## 2023-08-01 NOTE — Progress Notes (Signed)
 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  We actually x-rayed her knee in June and the knee replacement look good.  What we have been concerned about most is all the falls that she has had.  She continues to be a fall risk.  She is actually planning at some point to see a neurosurgeon in [place] for another opinion.  She does have significant cervical spine issues.  She is someone who continues to be a fall risk and has still had multiple falls even since I have seen her last.  She even tripped yesterday.  She does state that the knee joint itself is doing well.  Her right operative knee has full extension and full flexion.  The knee feels lunacy stable.  We did not x-ray it today.  She will continue to be incredibly careful as she walks and should use an assistive device given her continued falls.  From orthopedic standpoint, we will see her back at the 1 year standpoint from surgery with a standing AP and lateral of her right operative knee.  If there are any other orthopedic issues that I can help with before then she will let us  know.

## 2023-08-03 ENCOUNTER — Telehealth: Payer: Self-pay | Admitting: Family Medicine

## 2023-08-03 NOTE — Addendum Note (Signed)
 Addended by: TRUDY CURVIN RAMAN on: 08/03/2023 03:00 PM   Modules accepted: Orders

## 2023-08-03 NOTE — Telephone Encounter (Signed)
 Left a message for the pt to call and do a lab appointment for ptt and inr

## 2023-08-06 NOTE — Telephone Encounter (Signed)
 Lab appt scheduled 08/13/23

## 2023-08-13 ENCOUNTER — Other Ambulatory Visit (INDEPENDENT_AMBULATORY_CARE_PROVIDER_SITE_OTHER)

## 2023-08-13 ENCOUNTER — Ambulatory Visit (INDEPENDENT_AMBULATORY_CARE_PROVIDER_SITE_OTHER): Admitting: Licensed Clinical Social Worker

## 2023-08-13 DIAGNOSIS — F4010 Social phobia, unspecified: Secondary | ICD-10-CM

## 2023-08-13 DIAGNOSIS — R42 Dizziness and giddiness: Secondary | ICD-10-CM

## 2023-08-13 DIAGNOSIS — F4310 Post-traumatic stress disorder, unspecified: Secondary | ICD-10-CM | POA: Diagnosis not present

## 2023-08-13 DIAGNOSIS — F3341 Major depressive disorder, recurrent, in partial remission: Secondary | ICD-10-CM

## 2023-08-13 LAB — PROTIME-INR
INR: 1 ratio (ref 0.8–1.0)
Prothrombin Time: 10.9 s (ref 9.6–13.1)

## 2023-08-13 LAB — APTT: aPTT: 32.5 s (ref 25.4–36.8)

## 2023-08-13 NOTE — Progress Notes (Signed)
 THERAPIST PROGRESS NOTE  Session Time: 9-9:49am  Participation Level: Active  Behavioral Response: CasualAlertAnxious  Type of Therapy: Individual Therapy  Treatment Goals addressed:  Active     BH CCP Acute or Chronic Trauma Reaction     LTG: Recall traumatic events without becoming overwhelmed with negative emotions (Progressing)     Start:  05/01/23    Expected End:  07/01/23       Goal Note     07/23/23: 60% progress; Shares EMDR has helped a lot stating I have tools I use to get my mind of that or I sit outside and listen to the birds and stuff and put it away. Reports she used to feel overwhelmed and wanted to shut down but not so much anymore.          STG: Reduce the negative impact trauma related symptoms have on social, occupational, and family functioning per pt self-report 3 out of 5 sessions documented.   (Progressing)     Start:  05/01/23    Expected End:  07/01/23      05/01/23: 50% progress I have to push myself, but I know I got to get it done.     Goal Note     07/23/23: 50% progress; I have gone into stores that I have not been in in a while. Reports slight anxiety and hypervigilance but my heart wasn't pounding out my chest like usual.          LTG:  Reduction in intrusive event recollections, avoidance of event reminders, intense arousal, or disinterest in activities or relationships  as evidenced by pt self report 3 out of 5 sessions documented.   (Completed/Met)     Start:  05/01/23    Expected End:  07/01/23    Resolved:  07/23/23   05/01/23: 50% progress I still have them quite often (2-3 a week) but reports she was experiencing them daily.     Goal Note     07/23/23: Reports she limits her social exposure to her family and best friend. Shares she is ok with this pattern and feels this is a safe way to ensure she has the support she needs to cope with stressors.          STG - Misc 1 (Completed/Met)     Start:  05/01/23    Expected  End:  07/01/23    Resolved:  07/23/23   Goal: STG: [name] will identify internal and external stimuli that trigger PTSD symptoms  as evidenced by pt self report 3 out of 5 sessions documented.      05/01/23: 35% progress I try to avoid conflicts or in public and someone is having a fuss or screaming children.  Identifies she is able to utilize her coping skills to calm down.        STG: STG: [name] will acknowledge that healing from PTSD is a gradual process  as evidenced by pt self report 3 out of 5 sessions documented.   (Completed/Met)     Start:  05/01/23    Expected End:  07/01/23    Resolved:  05/01/23         05/01/23: 100% progress It's a real slow process for me.        STG:  [name] will identify negative coping strategies that have been used to cope with the feelings associated with the trauma  as evidenced by pt self report 3 out of 5 sessions documented.   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 Acknowledged replacement and use of healthier coping skills. Denies feeling comfortable joining AA but shares she can reach out to her support system for words of encouragement.     Goal Note     07/23/23: 60% progress; She reports she has been able to identify and reframe negative cognitions.          Provide and outline the treatment process to [name], explaining that it will include a gradual processing of the details and feelings associated with the trauma and developing new, more appropriate coping strategies     Start:  05/01/23         Cooperate with trauma-focused psychotherapy techniques to reduce emotional reaction to the traumatic event      Start:  05/01/23         Teach [name] coping strategies (e.g., writing down thoughts and feelings in a journal; taking deep, slow breaths; calling a support person to talk about  memories) to deal with trauma memories and sudden emotional reactions without becoming emotionally n     Start:  05/01/23            ProgressTowards Goals: Progressing  Interventions: Supportive, Reframing, and Other: EMDR  Summary:[name] is a 58 y.o. female who presents with symptoms of anxiety, depression, trauma. Patient identifies symptoms to include intrusive thoughts, reexperiencing, tearfulness, low mood, fatigue, difficulty sleeping, anxiety in large crowds. Pt was oriented times 5. Pt was cooperative and engaged. Pt denies SI/HI/AVH.   The patient participated in a session to process recent family vacation experiences, during which she encountered a series of triggers related to her history of substance use, as well as some new traumatizing events. She recognized ways to stay in tune with herself by identifying controllable factors, such as removing herself from certain situations and being mindful of her coping skills.  During the session, the patient continued with EMDR therapy by processing a past traumatic memory, which decreased her subjective units of distress score from 4 to 0. She expressed struggling with the question, "Why me?" The clinician collaborated with the patient to challenge this perspective, helping her understand the use of cognitive restructuring. Together, they identified controllable factors and ways in which she has learned to trust herself and her internal alarm system. The patient reaffirmed the belief, "I survived," reflecting on her efforts to reclaim power from her trauma history and identifying ways she is choosing to move forward.  Suicidal/Homicidal: Nowithout intent/plan  Therapist Response:  [name] utilized active listening to create a safe space for patient to process recent life events. Assessed for safety, stressors, symptoms since last session. Provided pscyhoeducation on safety behaviors with social anxiety.  Clinician utilized EMDR to continue  to work through trauma memories and reframe negative cognitions related to the patient trauma history.   Plan: Return again in 2 weeks.  Diagnosis: PTSD (post-traumatic stress disorder)  MDD (major depressive disorder), recurrent, in partial remission (HCC)  Social anxiety disorder   Collaboration of Care: AEB psychiatrist can access notes and [name]. Will review psychiatrists' notes. Check in with the patient and will see LCSW per availability. Patient agreed with treatment recommendations.   Patient/Guardian was advised Release of Information must be obtained prior to any record release in order to collaborate their care with an outside provider. Patient/Guardian  was advised if they have not already done so to contact the registration department to sign all necessary forms in order for us  to release information regarding their care.   Consent: Patient/Guardian gives verbal consent for treatment and assignment of benefits for services provided during this visit. Patient/Guardian expressed understanding and agreed to proceed.   [name] Husband, LCSW 08/13/2023

## 2023-09-13 ENCOUNTER — Ambulatory Visit (INDEPENDENT_AMBULATORY_CARE_PROVIDER_SITE_OTHER): Admitting: Licensed Clinical Social Worker

## 2023-09-13 DIAGNOSIS — F3341 Major depressive disorder, recurrent, in partial remission: Secondary | ICD-10-CM

## 2023-09-13 DIAGNOSIS — F4010 Social phobia, unspecified: Secondary | ICD-10-CM

## 2023-09-13 DIAGNOSIS — F4310 Post-traumatic stress disorder, unspecified: Secondary | ICD-10-CM

## 2023-09-13 NOTE — Progress Notes (Signed)
 THERAPIST PROGRESS NOTE  Session Time: 9-9:46am  Participation Level: Active  Behavioral Response: CasualAlertEuthymic  Type of Therapy: Individual Therapy  Treatment Goals addressed:  Active     BH CCP Acute or Chronic Trauma Reaction     LTG: Recall traumatic events without becoming overwhelmed with negative emotions (Progressing)     Start:  05/01/23    Expected End:  07/01/23       Goal Note     07/23/23: 60% progress; Shares EMDR has helped a lot stating I have tools I use to get my mind of that or I sit outside and listen to the birds and stuff and put it away. Reports she used to feel overwhelmed and wanted to shut down but not so much anymore.          STG: Reduce the negative impact trauma related symptoms have on social, occupational, and family functioning per pt self-report 3 out of 5 sessions documented.   (Progressing)     Start:  05/01/23    Expected End:  07/01/23      05/01/23: 50% progress I have to push myself, but I know I got to get it done.     Goal Note     07/23/23: 50% progress; I have gone into stores that I have not been in in a while. Reports slight anxiety and hypervigilance but my heart wasn't pounding out my chest like usual.          LTG:  Reduction in intrusive event recollections, avoidance of event reminders, intense arousal, or disinterest in activities or relationships  as evidenced by pt self report 3 out of 5 sessions documented.   (Completed/Met)     Start:  05/01/23    Expected End:  07/01/23    Resolved:  07/23/23   05/01/23: 50% progress I still have them quite often (2-3 a week) but reports she was experiencing them daily.     Goal Note     07/23/23: Reports she limits her social exposure to her family and best friend. Shares she is ok with this pattern and feels this is a safe way to ensure she has the support she needs to cope with stressors.          STG - Misc 1 (Completed/Met)     Start:  05/01/23     Expected End:  07/01/23    Resolved:  07/23/23   Goal: STG: [name] will identify internal and external stimuli that trigger PTSD symptoms  as evidenced by pt self report 3 out of 5 sessions documented.      05/01/23: 35% progress I try to avoid conflicts or in public and someone is having a fuss or screaming children.  Identifies she is able to utilize her coping skills to calm down.        STG: STG: [name] will acknowledge that healing from PTSD is a gradual process  as evidenced by pt self report 3 out of 5 sessions documented.   (Completed/Met)     Start:  05/01/23    Expected End:  07/01/23    Resolved:  05/01/23         05/01/23: 100% progress It's a real slow process for me.        STG:  [name] will identify negative coping strategies that have been used to cope with the feelings associated with the trauma  as evidenced by pt self report 3 out of 5 sessions documented.   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 Acknowledged replacement and use of healthier coping skills. Denies feeling comfortable joining AA but shares she can reach out to her support system for words of encouragement.     Goal Note     07/23/23: 60% progress; She reports she has been able to identify and reframe negative cognitions.          Provide and outline the treatment process to [name], explaining that it will include a gradual processing of the details and feelings associated with the trauma and developing new, more appropriate coping strategies     Start:  05/01/23         Cooperate with trauma-focused psychotherapy techniques to reduce emotional reaction to the traumatic event      Start:  05/01/23         Teach [name] coping strategies (e.g., writing down thoughts and feelings in a journal; taking deep, slow breaths; calling a support person to talk  about memories) to deal with trauma memories and sudden emotional reactions without becoming emotionally n     Start:  05/01/23            ProgressTowards Goals: Progressing  Interventions: Supportive, Reframing, and Other: EMDR  Summary: [name] is a 58 y.o. female who presents with symptoms of anxiety, depression, trauma. Patient identifies symptoms to include intrusive thoughts, reexperiencing, tearfulness, low mood, fatigue, difficulty sleeping, anxiety in large crowds. Pt was oriented times 5. Pt was cooperative and engaged. Pt denies SI/HI/AVH.   Patient utilized therapeutic space to process recent events citing continued progress and getting outside and spending time in the garden.  Patient reflected on improvements with trauma symptoms related to flashbacks citing she continues to challenge herself to be out about in the community and cope with encountering triggers such as conflict amongst others and a new discovery of loud noises specifically a balloon popping.  Patient identifies she has been able to successfully remove herself from the situations and emotionally regulate following triggers.  Patient reflected on the recent anniversary of her mother's passing and explored her progress grieving the loss of her mother.  Identified ways in which she was able to utilize her support system to cope with grieving.  Patient continued EMDR by processing present stressors related to applying for disability.  Patient identified stress and anger related to feeling others do not believe her and she has no control over the situation.  Clinician worked with patient through EMDR decreasing reported subjective units of distress from a score of 5 to a score of 0.  Clinician and patient instilled the adaptive belief I can control whom I trust and my actions.  Suicidal/Homicidal: Nowithout intent/plan  Therapist Response: Cln utilized active listening to create a safe space for patient to  process recent life events. Assessed for safety, stressors, symptoms since last session. Provided pscyhoeducation on safety behaviors with social anxiety. Clinician utilized EMDR to continue to work through trauma memories and reframe negative cognitions related to the patient trauma history.   Plan: Return again in 2 weeks.  Diagnosis: PTSD (post-traumatic stress disorder)  MDD (major depressive disorder), recurrent, in partial remission (HCC)  Social anxiety disorder   Collaboration of Care: AEB psychiatrist can access notes and cln. Will review psychiatrists' notes. Check in with the patient and will see LCSW  per availability. Patient agreed with treatment recommendations.   Patient/Guardian was advised Release of Information must be obtained prior to any record release in order to collaborate their care with an outside provider. Patient/Guardian was advised if they have not already done so to contact the registration department to sign all necessary forms in order for us  to release information regarding their care.   Consent: Patient/Guardian gives verbal consent for treatment and assignment of benefits for services provided during this visit. Patient/Guardian expressed understanding and agreed to proceed.   [name] Husband, LCSW 09/13/2023

## 2023-09-14 ENCOUNTER — Telehealth: Admitting: Psychiatry

## 2023-09-14 ENCOUNTER — Encounter: Payer: Self-pay | Admitting: Psychiatry

## 2023-09-14 DIAGNOSIS — F3342 Major depressive disorder, recurrent, in full remission: Secondary | ICD-10-CM

## 2023-09-14 DIAGNOSIS — F4010 Social phobia, unspecified: Secondary | ICD-10-CM | POA: Diagnosis not present

## 2023-09-14 DIAGNOSIS — F4310 Post-traumatic stress disorder, unspecified: Secondary | ICD-10-CM

## 2023-09-14 DIAGNOSIS — G2581 Restless legs syndrome: Secondary | ICD-10-CM

## 2023-09-14 DIAGNOSIS — F1021 Alcohol dependence, in remission: Secondary | ICD-10-CM

## 2023-09-14 DIAGNOSIS — F429 Obsessive-compulsive disorder, unspecified: Secondary | ICD-10-CM | POA: Diagnosis not present
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
## 2023-09-15 ENCOUNTER — Other Ambulatory Visit: Payer: Self-pay | Admitting: Family Medicine

## 2023-09-15 ENCOUNTER — Other Ambulatory Visit: Payer: Self-pay | Admitting: Psychiatry

## 2023-09-15 ENCOUNTER — Other Ambulatory Visit: Payer: Self-pay | Admitting: Physician Assistant

## 2023-09-15 DIAGNOSIS — F3341 Major depressive disorder, recurrent, in partial remission: Secondary | ICD-10-CM

## 2023-09-15 DIAGNOSIS — F4010 Social phobia, unspecified: Secondary | ICD-10-CM

## 2023-09-15 DIAGNOSIS — F4310 Post-traumatic stress disorder, unspecified: Secondary | ICD-10-CM

## 2023-09-15 DIAGNOSIS — G2581 Restless legs syndrome: Secondary | ICD-10-CM

## 2023-09-17 ENCOUNTER — Other Ambulatory Visit: Payer: Self-pay

## 2023-09-17 MED ORDER — ROPINIROLE HCL 1 MG PO TABS
1.0000 mg | ORAL_TABLET | Freq: Every day | ORAL | 1 refills | Status: DC
  Filled 2023-09-24: qty 90, 90d supply, fill #0
  Filled 2023-12-19 (×2): qty 90, 90d supply, fill #1

## 2023-09-17 MED ORDER — QUETIAPINE FUMARATE 25 MG PO TABS
75.0000 mg | ORAL_TABLET | Freq: Every day | ORAL | 1 refills | Status: DC
  Filled 2023-09-24: qty 90, 30d supply, fill #0
  Filled 2023-10-31: qty 90, 30d supply, fill #1

## 2023-09-17 MED ORDER — OMEPRAZOLE 40 MG PO CPDR
40.0000 mg | DELAYED_RELEASE_CAPSULE | Freq: Two times a day (BID) | ORAL | 4 refills | Status: AC
  Filled 2023-09-24: qty 60, 30d supply, fill #0
  Filled 2023-10-31: qty 60, 30d supply, fill #1
  Filled 2023-12-13: qty 60, 30d supply, fill #2
  Filled 2024-01-10: qty 60, 30d supply, fill #3
  Filled 2024-02-11: qty 60, 30d supply, fill #4

## 2023-09-17 MED ORDER — SERTRALINE HCL 100 MG PO TABS
200.0000 mg | ORAL_TABLET | Freq: Every day | ORAL | 1 refills | Status: DC
  Filled 2023-09-24: qty 180, 90d supply, fill #0
  Filled 2024-01-10: qty 180, 90d supply, fill #1

## 2023-09-17 MED ORDER — HYDROXYZINE PAMOATE 25 MG PO CAPS
25.0000 mg | ORAL_CAPSULE | Freq: Two times a day (BID) | ORAL | 1 refills | Status: DC | PRN
  Filled 2023-09-24: qty 60, 30d supply, fill #0
  Filled 2023-11-14: qty 60, 30d supply, fill #1

## 2023-09-24 ENCOUNTER — Other Ambulatory Visit: Payer: Self-pay

## 2023-09-25 ENCOUNTER — Other Ambulatory Visit: Payer: Self-pay

## 2023-10-03 ENCOUNTER — Ambulatory Visit: Admitting: Orthopaedic Surgery

## 2023-10-04 ENCOUNTER — Ambulatory Visit: Admitting: Licensed Clinical Social Worker

## 2023-10-04 DIAGNOSIS — F4010 Social phobia, unspecified: Secondary | ICD-10-CM

## 2023-10-04 DIAGNOSIS — F3341 Major depressive disorder, recurrent, in partial remission: Secondary | ICD-10-CM

## 2023-10-04 DIAGNOSIS — F4310 Post-traumatic stress disorder, unspecified: Secondary | ICD-10-CM

## 2023-10-04 NOTE — Progress Notes (Signed)
 THERAPIST PROGRESS NOTE  Session Time: 10:01-10:55am  Participation Level: Active  Behavioral Response: CasualAlertEuthymic  Type of Therapy: Individual Therapy  Treatment Goals addressed:  Active     BH CCP Acute or Chronic Trauma Reaction     LTG: Recall traumatic events without becoming overwhelmed with negative emotions (Progressing)     Start:  05/01/23    Expected End:  07/01/23       Goal Note     07/23/23: 60% progress; Shares EMDR has helped a lot stating I have tools I use to get my mind of that or I sit outside and listen to the birds and stuff and put it away. Reports she used to feel overwhelmed and wanted to shut down but not so much anymore.          STG: Reduce the negative impact trauma related symptoms have on social, occupational, and family functioning per pt self-report 3 out of 5 sessions documented.   (Progressing)     Start:  05/01/23    Expected End:  07/01/23      05/01/23: 50% progress I have to push myself, but I know I got to get it done.     Goal Note     07/23/23: 50% progress; I have gone into stores that I have not been in in a while. Reports slight anxiety and hypervigilance but my heart wasn't pounding out my chest like usual.          LTG:  Reduction in intrusive event recollections, avoidance of event reminders, intense arousal, or disinterest in activities or relationships  as evidenced by pt self report 3 out of 5 sessions documented.   (Completed/Met)     Start:  05/01/23    Expected End:  07/01/23    Resolved:  07/23/23   05/01/23: 50% progress I still have them quite often (2-3 a week) but reports she was experiencing them daily.     Goal Note     07/23/23: Reports she limits her social exposure to her family and best friend. Shares she is ok with this pattern and feels this is a safe way to ensure she has the support she needs to cope with stressors.          STG - Misc 1 (Completed/Met)     Start:  05/01/23     Expected End:  07/01/23    Resolved:  07/23/23   Goal: STG: [name] will identify internal and external stimuli that trigger PTSD symptoms  as evidenced by pt self report 3 out of 5 sessions documented.      05/01/23: 35% progress I try to avoid conflicts or in public and someone is having a fuss or screaming children.  Identifies she is able to utilize her coping skills to calm down.        STG: STG: [name] will acknowledge that healing from PTSD is a gradual process  as evidenced by pt self report 3 out of 5 sessions documented.   (Completed/Met)     Start:  05/01/23    Expected End:  07/01/23    Resolved:  05/01/23         05/01/23: 100% progress It's a real slow process for me.        STG:  [name] will identify negative coping strategies that have been used to cope with the feelings associated with the trauma  as evidenced by pt self report 3 out of 5 sessions documented.   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 Acknowledged replacement and use of healthier coping skills. Denies feeling comfortable joining AA but shares she can reach out to her support system for words of encouragement.     Goal Note     07/23/23: 60% progress; She reports she has been able to identify and reframe negative cognitions.          Provide and outline the treatment process to [name], explaining that it will include a gradual processing of the details and feelings associated with the trauma and developing new, more appropriate coping strategies     Start:  05/01/23         Cooperate with trauma-focused psychotherapy techniques to reduce emotional reaction to the traumatic event      Start:  05/01/23         Teach [name] coping strategies (e.g., writing down thoughts and feelings in a journal; taking deep, slow breaths; calling a support person to talk  about memories) to deal with trauma memories and sudden emotional reactions without becoming emotionally n     Start:  05/01/23            ProgressTowards Goals: Progressing  Interventions: Supportive, Reframing, and Other: Psychoeducation  Summary: [name] is a 58 y.o. female who presents with symptoms of anxiety, depression, trauma. Patient identifies symptoms to include intrusive thoughts, reexperiencing, tearfulness, low mood, fatigue, difficulty sleeping, anxiety in large crowds. Pt was oriented times 5. Pt was cooperative and engaged. Pt denies SI/HI/AVH.   Clinician reviewed patient's target sequence plan with her with patient identifying all memories had been completed and processed through EMDR.  Patient processed distress for feelings related to the state of the world and recent events occurring in the news.  Clinician worked with patient to reflect on exposure to news outlets and identify controllable factors.  Patient identifies recent events involving children triggered emotions attached to her own trauma experiences.  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  Clinician utilized the second half of session to provide psychoeducation on the connection between OCD and PTSD.  Patient identified possible correlations between trauma experiences during certain ages and fixation on certain numbers.  Patient also identifies she feels if she does not complete compulsions negative what if's regarding the future will occur.  Clinician provided homework for patient to read further information provided on the connection between PTSD and OCD.  Suicidal/Homicidal: Nowithout intent/plan  Therapist Response:  Cln utilized active listening to create a safe space for patient to process recent life events. Assessed for safety, stressors, symptoms since last session.  Reflected on controllable factors with patient regarding  trauma trigger occurs and reviewed coping mechanisms.  Provided psychoeducation on the connection between OCD and PTSD.  Plan: Return again in 3 weeks.  Diagnosis: PTSD (post-traumatic stress disorder)  Recurrent major depressive disorder, in partial remission (HCC)  Social anxiety disorder   Collaboration of Care: AEB psychiatrist can access notes and cln. Will review psychiatrists' notes. Check in with the patient and will see LCSW per availability. Patient agreed with treatment recommendations.   Patient/Guardian was advised Release of Information must be obtained prior to any record release in order to collaborate their care with an outside provider. Patient/Guardian was advised if they have  not already done so to contact the registration department to sign all necessary forms in order for us  to release information regarding their care.   Consent: Patient/Guardian gives verbal consent for treatment and assignment of benefits for services provided during this visit. Patient/Guardian expressed understanding and agreed to proceed.   [name] Husband, LCSW 10/04/2023

## 2023-10-10 ENCOUNTER — Other Ambulatory Visit (INDEPENDENT_AMBULATORY_CARE_PROVIDER_SITE_OTHER): Payer: Self-pay

## 2023-10-10 ENCOUNTER — Ambulatory Visit: Admitting: Orthopaedic Surgery

## 2023-10-10 DIAGNOSIS — M79672 Pain in left foot: Secondary | ICD-10-CM

## 2023-10-10 NOTE — Progress Notes (Signed)
 The patient is well-known to us .  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  She does have an appointment to see us  in November of this year the 1 year standpoint for x-rays of the knee.  Today she is in for new issue.  She has been having some falls and is developed some left midfoot foot pain as well as some ankle pain.  She does try to wear very supportive shoes as well.  This has been more of a chronic issue for her.  Examination of her left foot shows no swelling.  There is pain at the midfoot dorsal area but more lateral than medial.  This does cause some ankle pain and when she stands up she has some anterior ankle pain.  The arch of her foot appears normal.  She does not have wide feet.  Her foot is well-perfused and is neurovascularly intact.  She has good range of motion of her ankle as well and her Achilles is not tight.  3 views of the left foot some mild midfoot arthritic changes but are only mild.  There is otherwise no acute findings.  Certainly some of her left foot pain can be coming from pressure on left foot from dealing with her knee replacement surgery as well as multiple falls that she has had from her other issues.  I have recommended that she look into more supportive shoe wear and even potentially inserts in going to a place such as [employer] over the shoe market to look into this.  Will see her at her regular follow-up appointment in November for an AP and lateral of her right operative knee.

## 2023-10-16 ENCOUNTER — Ambulatory Visit (INDEPENDENT_AMBULATORY_CARE_PROVIDER_SITE_OTHER): Admitting: Licensed Clinical Social Worker

## 2023-10-16 DIAGNOSIS — F3341 Major depressive disorder, recurrent, in partial remission: Secondary | ICD-10-CM

## 2023-10-16 DIAGNOSIS — F4010 Social phobia, unspecified: Secondary | ICD-10-CM | POA: Diagnosis not present

## 2023-10-16 DIAGNOSIS — F4310 Post-traumatic stress disorder, unspecified: Secondary | ICD-10-CM

## 2023-10-16 NOTE — Progress Notes (Signed)
 THERAPIST PROGRESS NOTE  Progress Review   Session Time: 9-9:52am  Participation Level: Active  Behavioral Response: CasualAlertEuthymic  Type of Therapy: Individual Therapy  Treatment Goals addressed:  Active     BH CCP Acute or Chronic Trauma Reaction     LTG: Recall traumatic events without becoming overwhelmed with negative emotions (Completed/Met)     Start:  05/01/23    Expected End:  01/15/24    Resolved:  10/16/23    Goal Note     10/16/23: Im not overwhelmed with it. I just know it happened.          STG: Reduce the negative impact trauma related symptoms have on social, occupational, and family functioning per pt self-report 3 out of 5 sessions documented.   (Not Progressing)     Start:  05/01/23    Expected End:  01/15/24      05/01/23: 50% progress I have to push myself, but I know I got to get it done.     Goal Note     10/16/23: Reports she is not comfortable going out into crowds due to past experiences and current dynamics occurring in the world that interfere with her ability to feel safe. Shares she can go out with others but not alone. Patient notes some instances where she was able to be in large crowds or stores alone.          LTG:  Reduction in intrusive event recollections, avoidance of event reminders, intense arousal, or disinterest in activities or relationships  as evidenced by pt self report 3 out of 5 sessions documented.   (Completed/Met)     Start:  05/01/23    Expected End:  07/01/23    Resolved:  07/23/23   05/01/23: 50% progress I still have them quite often (2-3 a week) but reports she was experiencing them daily.     Goal Note     07/23/23: Reports she limits her social exposure to her family and best friend. Shares she is ok with this pattern and feels this is a safe way to ensure she has the support she needs to cope with stressors.          STG - Misc 1 (Completed/Met)     Start:  05/01/23    Expected End:  07/01/23     Resolved:  07/23/23   Goal: STG: Glendine will identify internal and external stimuli that trigger PTSD symptoms  as evidenced by pt self report 3 out of 5 sessions documented.      05/01/23: 35% progress I try to avoid conflicts or in public and someone is having a fuss or screaming children.  Identifies she is able to utilize her coping skills to calm down.        STG: STG: Cayleen will acknowledge that healing from PTSD is a gradual process  as evidenced by pt self report 3 out of 5 sessions documented.   (Completed/Met)     Start:  05/01/23    Expected End:  07/01/23    Resolved:  05/01/23         05/01/23: 100% progress It's a real slow process for me.        STG:  Nomi will identify negative coping strategies that have been used to cope with the feelings associated with the trauma  as evidenced by pt self report 3 out of 5 sessions documented.   (Progressing)     Start:  05/01/23    Expected  End:  01/15/24      05/01/23: 50% progress My unhealthy was drinking all the time and with being sober 3 years and a couple of months. Acknowledged replacement and use of healthier coping skills. Denies feeling comfortable joining AA but shares she can reach out to her support system for words of encouragement.     Goal Note     10/16/23: Patient reports successful use of healthy coping skills to address trauma sxs.          LTG: Elimination of maladaptive behaviors and thinking patterns which interfere with resolution of trauma as evidenced by self reports and PCL5 scores (Progressing)     Start:  10/16/23    Expected End:  01/15/24       Goal Note     10/16/23: It's better. I don't blame myself for what happened.         Teach Yuval coping strategies (e.g., writing down thoughts and feelings in a journal; taking deep, slow breaths; calling a support person to talk about memories) to deal with trauma memories and sudden emotional reactions without becoming emotionally n      Start:  05/01/23         Provide and outline the treatment process to Tniya, explaining that it will include a gradual processing of the details and feelings associated with the trauma and developing new, more appropriate coping strategies (Completed)     Start:  05/01/23    End:  10/16/23      Cooperate with trauma-focused psychotherapy techniques to reduce emotional reaction to the traumatic event  (Completed)     Start:  05/01/23    End:  10/16/23        OP Depression     LTG: Reduce frequency, intensity, and duration of depression symptoms so that daily functioning is improved     Start:  10/16/23    Expected End:  01/15/24         STG: Marily will identify cognitive patterns and beliefs that support depression     Start:  10/16/23    Expected End:  01/15/24         STG: Process grief related to feeling unloved within her marriage.      Start:  10/16/23    Expected End:  01/15/24         Work with [name] to identify the major components of a recent episode of depression: physical symptoms, major thoughts and images, and major behaviors they experienced     Start:  10/16/23         Ginia will identify 2 cognitive distortions they are currently using and write reframing statements to replace them     Start:  10/16/23          ProgressTowards Goals: Progressing  Interventions: Strength-based and Supportive  Summary: Xian Apostol is a 58 y.o. female who presents with symptoms of anxiety, depression, trauma. Patient identifies symptoms to include intrusive thoughts, reexperiencing, tearfulness, low mood, fatigue, difficulty sleeping, anxiety in large crowds. Pt was oriented times 5. Pt was cooperative and engaged. Pt denies SI/HI/AVH.   Cln utilized the first half of session to review patients progress. See progress notes documented above.   The clinician readministered the PHQ-9 and GAD-7 assessments. The patient's anxiety scores increased from 10 to 18, and  depression scores also increased from 10 to 13. The patient reports she is getting 5-6 hours of restless sleep waking every hour. Expressed increased stressors as a result  of her fathers cognitive decline due to age.   Cln readminstered the PCL5 with patients PTSD score increasing from 22 to 32. Reports she has experiencing a week or two at a time when no memories pop in her head. Reports then she will experiences a week or two where memories are consistent. Shares she is able to use coping skills to not dwell on her past memories.   Patient expressed a desire to process unresolved grief related to issues within her marriage and feelings of resentment. Patient became tearful citing she feels she has never experienced unconditional love by a partner and has regrets regarding previous choices.   Suicidal/Homicidal: Nowithout intent/plan   Therapist Response: Cln utilized active listening to create a safe space for patient to process recent life events. Assessed for safety, stressors, symptoms since last session. Cln re-administered PHQ9, GAD7, and PCL5 screeners. Update patients treatment plan based on reported progress. Cln re-administered PCL5, PHQ9, GAD7 to assess for patients progress. Updated patients TP based on progress and new goals. Explored grief and feelings related to past experiences with in her marriage.   Plan: Return again in 2 weeks.  Diagnosis: PTSD (post-traumatic stress disorder)  Recurrent major depressive disorder, in partial remission  Social anxiety disorder   Collaboration of Care: AEB psychiatrist can access notes and cln. Will review psychiatrists' notes. Check in with the patient and will see LCSW per availability. Patient agreed with treatment recommendations.   Patient/Guardian was advised Release of Information must be obtained prior to any record release in order to collaborate their care with an outside provider. Patient/Guardian was advised if they have not  already done so to contact the registration department to sign all necessary forms in order for us  to release information regarding their care.   Consent: Patient/Guardian gives verbal consent for treatment and assignment of benefits for services provided during this visit. Patient/Guardian expressed understanding and agreed to proceed.   [name] Husband, LCSW 10/16/2023

## 2023-10-19 ENCOUNTER — Other Ambulatory Visit: Payer: Self-pay

## 2023-10-19 ENCOUNTER — Other Ambulatory Visit: Payer: Self-pay | Admitting: Family Medicine

## 2023-10-19 DIAGNOSIS — M79604 Pain in right leg: Secondary | ICD-10-CM

## 2023-10-19 MED ORDER — FUROSEMIDE 40 MG PO TABS
40.0000 mg | ORAL_TABLET | Freq: Every day | ORAL | 0 refills | Status: DC
  Filled 2023-10-19: qty 90, 90d supply, fill #0

## 2023-10-30 ENCOUNTER — Ambulatory Visit: Admitting: Licensed Clinical Social Worker

## 2023-10-30 NOTE — Progress Notes (Unsigned)
 THERAPIST PROGRESS NOTE  Session Time: ***  Participation Level: Active  Behavioral Response: CasualAlertEuthymic  Type of Therapy: Individual Therapy  Treatment Goals addressed:  Active     BH CCP Acute or Chronic Trauma Reaction     LTG: Recall traumatic events without becoming overwhelmed with negative emotions (Completed/Met)     Start:  05/01/23    Expected End:  01/15/24    Resolved:  10/16/23    Goal Note     10/16/23: Im not overwhelmed with it. I just know it happened.          STG: Reduce the negative impact trauma related symptoms have on social, occupational, and family functioning per pt self-report 3 out of 5 sessions documented.   (Not Progressing)     Start:  05/01/23    Expected End:  01/15/24      05/01/23: 50% progress I have to push myself, but I know I got to get it done.     Goal Note     10/16/23: Reports she is not comfortable going out into crowds due to past experiences and current dynamics occurring in the world that interfere with her ability to feel safe. Shares she can go out with others but not alone. Patient notes some instances where she was able to be in large crowds or stores alone.          LTG:  Reduction in intrusive event recollections, avoidance of event reminders, intense arousal, or disinterest in activities or relationships  as evidenced by pt self report 3 out of 5 sessions documented.   (Completed/Met)     Start:  05/01/23    Expected End:  07/01/23    Resolved:  07/23/23   05/01/23: 50% progress I still have them quite often (2-3 a week) but reports she was experiencing them daily.     Goal Note     07/23/23: Reports she limits her social exposure to her family and best friend. Shares she is ok with this pattern and feels this is a safe way to ensure she has the support she needs to cope with stressors.          STG - Misc 1 (Completed/Met)     Start:  05/01/23    Expected End:  07/01/23    Resolved:  07/23/23    Goal: STG: Chamya will identify internal and external stimuli that trigger PTSD symptoms  as evidenced by pt self report 3 out of 5 sessions documented.      05/01/23: 35% progress I try to avoid conflicts or in public and someone is having a fuss or screaming children.  Identifies she is able to utilize her coping skills to calm down.        STG: STG: Bridgett will acknowledge that healing from PTSD is a gradual process  as evidenced by pt self report 3 out of 5 sessions documented.   (Completed/Met)     Start:  05/01/23    Expected End:  07/01/23    Resolved:  05/01/23         05/01/23: 100% progress It's a real slow process for me.        STG:  Barbee will identify negative coping strategies that have been used to cope with the feelings associated with the trauma  as evidenced by pt self report 3 out of 5 sessions documented.   (Progressing)     Start:  05/01/23    Expected End:  01/15/24  05/01/23: 50% progress My unhealthy was drinking all the time and with being sober 3 years and a couple of months. Acknowledged replacement and use of healthier coping skills. Denies feeling comfortable joining AA but shares she can reach out to her support system for words of encouragement.     Goal Note     10/16/23: Patient reports successful use of healthy coping skills to address trauma sxs.          LTG: Elimination of maladaptive behaviors and thinking patterns which interfere with resolution of trauma as evidenced by self reports and PCL5 scores (Progressing)     Start:  10/16/23    Expected End:  01/15/24       Goal Note     10/16/23: It's better. I don't blame myself for what happened.         Teach Laquiesha coping strategies (e.g., writing down thoughts and feelings in a journal; taking deep, slow breaths; calling a support person to talk about memories) to deal with trauma memories and sudden emotional reactions without becoming emotionally n     Start:  05/01/23          Provide and outline the treatment process to Creta, explaining that it will include a gradual processing of the details and feelings associated with the trauma and developing new, more appropriate coping strategies (Completed)     Start:  05/01/23    End:  10/16/23      Cooperate with trauma-focused psychotherapy techniques to reduce emotional reaction to the traumatic event  (Completed)     Start:  05/01/23    End:  10/16/23        OP Depression     LTG: Reduce frequency, intensity, and duration of depression symptoms so that daily functioning is improved     Start:  10/16/23    Expected End:  01/15/24         STG: Charlotta will identify cognitive patterns and beliefs that support depression     Start:  10/16/23    Expected End:  01/15/24         STG: Process grief related to feeling unloved within her marriage.      Start:  10/16/23    Expected End:  01/15/24         Work with [name] to identify the major components of a recent episode of depression: physical symptoms, major thoughts and images, and major behaviors they experienced     Start:  10/16/23         Eyvette will identify 2 cognitive distortions they are currently using and write reframing statements to replace them     Start:  10/16/23            ProgressTowards Goals: Progressing  Interventions: {CHL AMB BH Type of Intervention:[id number]}  Summary: Mahli Glahn is a 58 y.o. female who presents with symptoms of anxiety, depression, trauma. Patient identifies symptoms to include intrusive thoughts, reexperiencing, tearfulness, low mood, fatigue, difficulty sleeping, anxiety in large crowds. Pt was oriented times 5. Pt was cooperative and engaged. Pt denies SI/HI/AVH.    UPDATE INTAKE  Suicidal/Homicidal: Nowithout intent/plan  Therapist Response: Cln utilized active listening to create a safe space for patient to process recent life events. Assessed for safety, stressors, symptoms since last  session.    Plan: Return again in 2 weeks.  Diagnosis: PTSD (post-traumatic stress disorder)  Recurrent major depressive disorder, in partial remission  Social anxiety disorder   Collaboration of Care: AEB  psychiatrist can access notes and cln. Will review psychiatrists' notes. Check in with the patient and will see LCSW per availability. Patient agreed with treatment recommendations.   Patient/Guardian was advised Release of Information must be obtained prior to any record release in order to collaborate their care with an outside provider. Patient/Guardian was advised if they have not already done so to contact the registration department to sign all necessary forms in order for us  to release information regarding their care.   Consent: Patient/Guardian gives verbal consent for treatment and assignment of benefits for services provided during this visit. Patient/Guardian expressed understanding and agreed to proceed.   [name] Husband, LCSW 10/30/2023

## 2023-11-01 ENCOUNTER — Ambulatory Visit (INDEPENDENT_AMBULATORY_CARE_PROVIDER_SITE_OTHER): Admitting: Licensed Clinical Social Worker

## 2023-11-01 DIAGNOSIS — F3341 Major depressive disorder, recurrent, in partial remission: Secondary | ICD-10-CM

## 2023-11-01 DIAGNOSIS — F4010 Social phobia, unspecified: Secondary | ICD-10-CM

## 2023-11-01 DIAGNOSIS — F4310 Post-traumatic stress disorder, unspecified: Secondary | ICD-10-CM
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
## 2023-11-01 NOTE — Progress Notes (Signed)
 THERAPIST PROGRESS NOTE  Year Review  Session Time: 9:06-9:54am  Participation Level: Active  Behavioral Response: CasualAlertEuthymic  Type of Therapy: Individual Therapy  Treatment Goals addressed:   Active     BH CCP Acute or Chronic Trauma Reaction     LTG: Recall traumatic events without becoming overwhelmed with negative emotions (Completed/Met)     Start:  05/01/23    Expected End:  01/15/24    Resolved:  10/16/23    Goal Note     10/16/23: Im not overwhelmed with it. I just know it happened.          STG: Reduce the negative impact trauma related symptoms have on social, occupational, and family functioning per pt self-report 3 out of 5 sessions documented.   (Not Progressing)     Start:  05/01/23    Expected End:  01/15/24      05/01/23: 50% progress I have to push myself, but I know I got to get it done.     Goal Note     10/16/23: Reports she is not comfortable going out into crowds due to past experiences and current dynamics occurring in the world that interfere with her ability to feel safe. Shares she can go out with others but not alone. Patient notes some instances where she was able to be in large crowds or stores alone.          LTG:  Reduction in intrusive event recollections, avoidance of event reminders, intense arousal, or disinterest in activities or relationships  as evidenced by pt self report 3 out of 5 sessions documented.   (Completed/Met)     Start:  05/01/23    Expected End:  07/01/23    Resolved:  07/23/23   05/01/23: 50% progress I still have them quite often (2-3 a week) but reports she was experiencing them daily.     Goal Note     07/23/23: Reports she limits her social exposure to her family and best friend. Shares she is ok with this pattern and feels this is a safe way to ensure she has the support she needs to cope with stressors.          STG - Misc 1 (Completed/Met)     Start:  05/01/23    Expected End:  07/01/23     Resolved:  07/23/23   Goal: STG: [name] will identify internal and external stimuli that trigger PTSD symptoms  as evidenced by pt self report 3 out of 5 sessions documented.      05/01/23: 35% progress I try to avoid conflicts or in public and someone is having a fuss or screaming children.  Identifies she is able to utilize her coping skills to calm down.        STG: STG: [name] will acknowledge that healing from PTSD is a gradual process  as evidenced by pt self report 3 out of 5 sessions documented.   (Completed/Met)     Start:  05/01/23    Expected End:  07/01/23    Resolved:  05/01/23         05/01/23: 100% progress It's a real slow process for me.        STG:  [name] will identify negative coping strategies that have been used to cope with the feelings associated with the trauma  as evidenced by pt self report 3 out of 5 sessions documented.   (Progressing)     Start:  05/01/23    Expected  End:  01/15/24      05/01/23: 50% progress My unhealthy was drinking all the time and with being sober 3 years and a couple of months. Acknowledged replacement and use of healthier coping skills. Denies feeling comfortable joining AA but shares she can reach out to her support system for words of encouragement.     Goal Note     10/16/23: Patient reports successful use of healthy coping skills to address trauma sxs.          LTG: Elimination of maladaptive behaviors and thinking patterns which interfere with resolution of trauma as evidenced by self reports and PCL5 scores (Progressing)     Start:  10/16/23    Expected End:  01/15/24       Goal Note     10/16/23: It's better. I don't blame myself for what happened.         Teach [name] coping strategies (e.g., writing down thoughts and feelings in a journal; taking deep, slow breaths; calling a support person to talk about memories) to deal with trauma memories and sudden emotional reactions without becoming emotionally n      Start:  05/01/23         Provide and outline the treatment process to Asjia, explaining that it will include a gradual processing of the details and feelings associated with the trauma and developing new, more appropriate coping strategies (Completed)     Start:  05/01/23    End:  10/16/23      Cooperate with trauma-focused psychotherapy techniques to reduce emotional reaction to the traumatic event  (Completed)     Start:  05/01/23    End:  10/16/23        OP Depression     LTG: Reduce frequency, intensity, and duration of depression symptoms so that daily functioning is improved     Start:  10/16/23    Expected End:  01/15/24         STG: [name] will identify cognitive patterns and beliefs that support depression     Start:  10/16/23    Expected End:  01/15/24         STG: Process grief related to feeling unloved within her marriage.      Start:  10/16/23    Expected End:  01/15/24         Work with [name] to identify the major components of a recent episode of depression: physical symptoms, major thoughts and images, and major behaviors they experienced     Start:  10/16/23         Chris will identify 2 cognitive distortions they are currently using and write reframing statements to replace them     Start:  10/16/23          ProgressTowards Goals: Progressing  Interventions: Solution Focused, Strength-based, and Supportive  Summary: [name] is a 58 y.o. female who presents with symptoms of anxiety, depression, trauma. Patient identifies symptoms to include intrusive thoughts, reexperiencing, tearfulness, low mood, fatigue, difficulty sleeping, anxiety in large crowds. Pt was oriented times 5. Pt was cooperative and engaged. Pt denies SI/HI/AVH.   Patient utilized therapeutic space to process feelings related to short-term memory functioning.  Expressed anxiety around a fear of dementia based on family history.  Reports therefore her anxiety has been  higher than normal due to recent memory problems.  Shared plans to meet with her doctor.  Reports her sleep has been doing okay.  Continues to report strange dreams/nightmares.  Clinician encouraged patient to keep a journal next to her bed to thought dump prior to falling asleep as well as journal about dreams upon waking.  Patient expressed relief related to update regarding her process of applying for disability.  Reports less stress this time as she is mindful of expectations and working towards excepting the unexpected.  Reports marked improvement with her anxiety citing on a scale of 10 and her anxiety used to set out an 8 or 9 but now it is at a 5.  Patient identifies improvements to her awareness of triggers and improvements with engaging with exposure work, specifically related to the volume and organization of her fridge.   Year Review/Assessment: Client continues to meet the diagnostic criteria for PTSD, MDD and social anxiety disorder as evidenced by persistent symptoms including Feeling down/depressed, trouble with sleep, fatigue, difficulty concentrating, feeling bad about herself, passive suicidal ideations, anxious feelings, uncontrollable worry, difficulty relaxing, restlessness, and irritability. Despite significant progress made in treatment over the past year, client continues to experience functional impairment in relationships and feeling safe engaging in public spaces and has not yet achieved a stable level of functioning. Without continued therapeutic support, there is a substantial risk of symptom relapse and a decline in functioning.   Intervention and Rationale: Continued therapeutic intervention is medically necessary to   Achieve treatment goals: Help the client progress toward their established long-term goals of Reduce the negative impact trauma related symptoms have on social, occupational, and family functioning and Process grief related to feeling unloved within her  marriage.  Stabilize chronic conditions: Maintain the client's current level of stability and prevent deterioration of their chronic mental health condition.  Strengthen coping skills: Reinforce skills learned to date and apply them to new or evolving challenges, thereby preventing relapse and sustaining progress.  Process deeper issues: Address underlying, more deeply ingrained issues or past traumas that require long-term therapeutic engagement to resolve.   Plan: Continue individual therapy at a frequency of every three weeks to address trauma symptoms and work toward resolving long-term treatment goals. The treatment plan will be reviewed every 90 days to assess progress and modify goals as appropriate.  Suicidal/Homicidal: Nowithout intent/plan  Therapist Response: Cln utilized active listening to create a safe space for patient to process recent life events. Assessed for safety, stressors, symptoms since last session.    Plan: Return again in 2 weeks.  Diagnosis: PTSD (post-traumatic stress disorder)  Recurrent major depressive disorder, in partial remission  Social anxiety disorder    Collaboration of Care: [facility] psychiatrist can access notes and cln. Will review psychiatrists' notes. Check in with the patient and will see LCSW per availability. Patient agreed with treatment recommendations.   Patient/Guardian was advised Release of Information must be obtained prior to any record release in order to collaborate their care with an outside provider. Patient/Guardian was advised if they have not already done so to contact the registration department to sign all necessary forms in order for us  to release information regarding their care.   Consent: Patient/Guardian gives verbal consent for treatment and assignment of benefits for services provided during this visit. Patient/Guardian expressed understanding and agreed to proceed.   [name] Husband, LCSW 11/01/2023

## 2023-11-01 NOTE — Addendum Note (Signed)
 Addended by: LANELL SMALL B on: 11/01/2023 03:02 PM   Modules accepted: Level of Service

## 2023-11-05 ENCOUNTER — Ambulatory Visit (INDEPENDENT_AMBULATORY_CARE_PROVIDER_SITE_OTHER): Admitting: Family Medicine

## 2023-11-05 VITALS — BP 98/70 | HR 74 | Temp 98.6°F | Resp 18 | Ht 62.0 in | Wt 161.0 lb

## 2023-11-05 DIAGNOSIS — D509 Iron deficiency anemia, unspecified: Secondary | ICD-10-CM

## 2023-11-05 DIAGNOSIS — M4802 Spinal stenosis, cervical region: Secondary | ICD-10-CM

## 2023-11-05 DIAGNOSIS — E1169 Type 2 diabetes mellitus with other specified complication: Secondary | ICD-10-CM

## 2023-11-05 DIAGNOSIS — E785 Hyperlipidemia, unspecified: Secondary | ICD-10-CM | POA: Diagnosis not present

## 2023-11-05 DIAGNOSIS — K580 Irritable bowel syndrome with diarrhea: Secondary | ICD-10-CM

## 2023-11-05 DIAGNOSIS — I10 Essential (primary) hypertension: Secondary | ICD-10-CM

## 2023-11-05 DIAGNOSIS — Z23 Encounter for immunization: Secondary | ICD-10-CM | POA: Diagnosis not present

## 2023-11-05 DIAGNOSIS — G2581 Restless legs syndrome: Secondary | ICD-10-CM | POA: Diagnosis not present

## 2023-11-05 NOTE — Progress Notes (Addendum)
 Subjective:    Patient ID: [name], female    DOB: 29-Jul-1965, 58 y.o.   MRN: [id number]  Chief Complaint  Patient presents with   leg concern    Left calf, no pain, has had it for a while.     HPI Patient is in today for f/u and c/o nodule L low leg.  Discussed the use of AI scribe software for clinical note transcription with the patient, who gave verbal consent to proceed.  History of Present Illness [name] is a 58 year old female with chronic pain and restless leg syndrome who presents for medication review and management of symptoms.  She experiences chronic pain, particularly in her neck and legs. Previous interventions, including injections from a neurosurgeon, have not provided relief. Due to a history of gastric bypass surgery, she cannot take anti-inflammatory medications. She is currently managing pain with Vicodin.  She experiences significant discomfort from restless leg syndrome, especially in the evenings, which disrupts her sleep. She currently takes Requip  for this condition but reports continued symptoms.  Her medication regimen includes Seroquel  for PTSD, anxiety, and depression; Zoloft  for anxiety; Angelita Folds for anxiety; cyclobenzaprine  as a muscle relaxer; Requip  for restless legs; Prilosec for acid reflux; and Vistaril  for anxiety. She also takes a B12 shot, estradiol  patch, Flonase , and vitamin D3.  She has a family history of memory issues, with her father showing signs of dementia and her sister experiencing similar symptoms. She herself reports memory lapses, which she attributes to her medication regimen.  She mentions a persistent scar from a dog scratch received approximately six months ago and a bruise of unknown origin that sometimes causes pain but not swelling.  Pt also has hx IBS--- and diarrhea and sometimes she can not make it to the bathroom.    Past Medical History:  Diagnosis Date   Allergy    Anemia    Anxiety     Arthritis    Asthma    Depression    GERD (gastroesophageal reflux disease)    Heart murmur    from childhood- pt had ECHO 12/27/21   Hiatal hernia    Hypertension    Peptic ulcer    PTSD (post-traumatic stress disorder)    UTI (urinary tract infection)     Past Surgical History:  Procedure Laterality Date   ABDOMINAL HYSTERECTOMY     fibroids   BIOPSY  07/11/2018   Procedure: BIOPSY;  Surgeon: Aneita Gwendlyn DASEN, MD;  Location: [facility] ENDOSCOPY;  Service: Endoscopy;;   BREAST BIOPSY Left    COLONOSCOPY  2014   ESOPHAGOGASTRODUODENOSCOPY (EGD) WITH PROPOFOL  N/A 07/11/2018   Procedure: ESOPHAGOGASTRODUODENOSCOPY (EGD) WITH PROPOFOL ;  Surgeon: Aneita Gwendlyn DASEN, MD;  Location: [facility] ENDOSCOPY;  Service: Endoscopy;  Laterality: N/A;   GASTRIC BYPASS  2007   HEMOSTASIS CLIP PLACEMENT  07/11/2018   Procedure: HEMOSTASIS CLIP PLACEMENT;  Surgeon: Aneita Gwendlyn DASEN, MD;  Location: [facility] ENDOSCOPY;  Service: Endoscopy;;   LAPAROTOMY N/A 03/08/2018   Procedure: EXPLORATORY LAPAROTOMY, REPAIR AND PATCH CLOSURE OF PERFORATED ULCER;  Surgeon: Sebastian Moles, MD;  Location: [facility] OR;  Service: General;  Laterality: N/A;   REDUCTION MAMMAPLASTY Bilateral    TONSILLECTOMY     and adenoids removed as a child   TOTAL KNEE ARTHROPLASTY Right 12/12/2022   Procedure: RIGHT TOTAL KNEE ARTHROPLASTY;  Surgeon: Vernetta Lonni GRADE, MD;  Location: [facility] OR;  Service: Orthopedics;  Laterality: Right;   TUBAL LIGATION  Family History  Problem Relation Age of Onset   Stroke Mother    Alzheimer's disease Mother    Hypertension Father    Cancer Father        Prostate   Dementia Father    Alcohol abuse Sister    Depression Sister    Alcohol abuse Brother    Depression Brother    Diabetes Maternal Grandmother    Alzheimer's disease Maternal Grandmother    Diabetes Maternal Grandfather    Liver disease Maternal Grandfather    Cancer Maternal Grandfather        Prostate   Diabetes Paternal Grandmother     Alzheimer's disease Paternal Grandmother    Diabetes Paternal Grandfather    Cancer Paternal Grandfather        Lung, Prostate   Colon cancer Neg Hx    Esophageal cancer Neg Hx    Pancreatic cancer Neg Hx    Stomach cancer Neg Hx    Colon polyps Neg Hx    Rectal cancer Neg Hx     Social History   Socioeconomic History   Marital status: Married    Spouse name: mark   Number of children: 0   Years of education: Not on file   Highest education level: 12th grade  Occupational History    Employer: [employer]    Comment: [employer]  Tobacco Use   Smoking status: Former    Current packs/day: 0.00    Types: Cigarettes    Quit date: 01/24/1987    Years since quitting: 36.9   Smokeless tobacco: Never  Vaping Use   Vaping status: Former   Substances: Flavoring   Devices: has vaped with flavor but no nicotine. Been 8 years  Substance and Sexual Activity   Alcohol use: Not Currently    Comment: pt has not drank alcohol since 02/2020   Drug use: No   Sexual activity: Not Currently  Other Topics Concern   Not on file  Social History Narrative   Regular exercise--no (because of knees)   Social Drivers of Health   Financial Resource Strain: Low Risk  (07/30/2023)   Overall Financial Resource Strain (CARDIA)    Difficulty of Paying Living Expenses: Not very hard  Food Insecurity: No Food Insecurity (07/30/2023)   Hunger Vital Sign    Worried About Running Out of Food in the Last Year: Never true    Ran Out of Food in the Last Year: Never true  Transportation Needs: No Transportation Needs (07/30/2023)   PRAPARE - Administrator, Civil Service (Medical): No    Lack of Transportation (Non-Medical): No  Physical Activity: Inactive (11/05/2023)   Exercise Vital Sign    Days of Exercise per Week: 0 days    Minutes of Exercise per Session: Not on file  Stress: Stress Concern Present (11/05/2023)   [employer] of Occupational Health - Occupational Stress Questionnaire     Feeling of Stress: Very much  Social Connections: Moderately Integrated (11/05/2023)   Social Connection and Isolation Panel    Frequency of Communication with Friends and Family: More than three times a week    Frequency of Social Gatherings with Friends and Family: Twice a week    Attends Religious Services: More than 4 times per year    Active Member of [employer] or Organizations: No    Attends Banker Meetings: Not on file    Marital Status: Married  Intimate Partner Violence: Not At Risk (12/12/2022)   Humiliation, Afraid,  Rape, and Kick questionnaire    Fear of Current or Ex-Partner: No    Emotionally Abused: No    Physically Abused: No    Sexually Abused: No    Outpatient Medications Prior to Visit  Medication Sig Dispense Refill   acetaminophen  (TYLENOL ) 650 MG CR tablet Take 1,300 mg by mouth every 8 (eight) hours as needed (pain).     Cholecalciferol  (VITAMIN D3) 50 MCG (2000 UT) capsule Take 1 capsule (2,000 Units total) by mouth daily. 100 capsule 0   clotrimazole -betamethasone  (LOTRISONE ) cream Apply 1 Application topically daily. 30 g 0   cyanocobalamin  (VITAMIN B12) 1000 MCG/ML injection Inject 1 mL (1,000 mcg total) into the muscle every 30 (thirty) days. 10 mL 0   cyclobenzaprine  (FLEXERIL ) 10 MG tablet Take 1 tablet (10 mg total) by mouth 3 (three) times daily as needed. for muscle spams 30 tablet 2   Diclofenac  Sodium 2 % SOLN Apply 1 pump twice daily as needed. 112 g 1   diphenoxylate -atropine  (LOMOTIL ) 2.5-0.025 MG tablet Take 1 tablet by mouth 4 (four) times daily as needed for diarrhea or loose stools. 90 tablet 1   estradiol  (ESTRACE ) 0.1 MG/GM vaginal cream Apply a pea-sized amount of cream to the fingertip and wipe in the front part of the vagina twice weekly 42.5 g 0   fluticasone  (FLONASE ) 50 MCG/ACT nasal spray Place 2 sprays into both nostrils daily. 16 g 5   furosemide  (LASIX ) 40 MG tablet Take 1 tablet (40 mg total) by mouth daily. 90 tablet 0    Homeopathic Products (ALLERGY MEDICINE PO) Take 1 tablet by mouth daily as needed (Takes the Clortabs. Equate Brand for allergies).     omeprazole  (PRILOSEC) 40 MG capsule Take 1 capsule (40 mg total) by mouth 2 (two) times daily. OPEN CAPSULE AND SPRINKLE ON APPLESAUCE. 60 capsule 4   OVER THE COUNTER MEDICATION Take 2 capsules by mouth daily. Mushroom Complex     Probiotic Product (ALIGN) CHEW Chew 2 tablets by mouth daily. Align pre and probiotic with vitamain C-take one tablet each Align pre and probiotic with Vitamin b12 for immune and energy-take one tablet each     rOPINIRole  (REQUIP ) 1 MG tablet Take 1 tablet (1 mg total) by mouth at bedtime. 90 tablet 1   sertraline  (ZOLOFT ) 100 MG tablet Take 2 tablets (200 mg total) by mouth daily. 180 tablet 1   SYRINGE-NEEDLE, DISP, 3 ML (B-D 3CC LUER-LOK SYR 25GX5/8) 25G X 5/8 3 ML MISC USE AS DIRECTED WITH B-12 INJECTION 16 each 2   busPIRone  (BUSPAR ) 30 MG tablet Take 1 tablet (30 mg total) by mouth 2 (two) times daily. 60 tablet 1   estradiol  (VIVELLE -DOT) 0.1 MG/24HR patch Place 1 patch (0.1 mg total) onto the skin 2 (two) times a week. 8 patch 2   hydrOXYzine  (VISTARIL ) 25 MG capsule Take 1 capsule (25 mg total) by mouth 2 (two) times daily as needed for anxiety. 60 capsule 1   promethazine -dextromethorphan (PROMETHAZINE -DM) 6.25-15 MG/5ML syrup Take 5 mLs by mouth 4 (four) times daily as needed. 118 mL 0   QUEtiapine  (SEROQUEL ) 25 MG tablet Take 3 tablets (75 mg total) by mouth at bedtime. 90 tablet 1   No facility-administered medications prior to visit.    Allergies  Allergen Reactions   Lamictal  [Lamotrigine ] Rash   Penicillins Other (See Comments)    Did it involve swelling of the face/tongue/throat, SOB, or low BP? Unk Did it involve sudden or severe rash/hives, skin peeling, or any  reaction on the inside of your mouth or nose? Unk Did you need to seek medical attention at a hospital or doctor's office? Unk When did it last  happen? Was 58 years old; reaction not recalled     If all above answers are NO, may proceed with cephalosporin use.    Codeine  Itching    Crazy dreams and itching-pt said she can take it with food    Review of Systems  Constitutional:  Negative for fever and malaise/fatigue.  HENT:  Negative for congestion.   Eyes:  Negative for blurred vision.  Respiratory:  Negative for cough and shortness of breath.   Cardiovascular:  Negative for chest pain, palpitations and leg swelling.  Gastrointestinal:  Negative for abdominal pain, blood in stool, nausea and vomiting.  Genitourinary:  Negative for dysuria and frequency.  Musculoskeletal:  Negative for back pain and falls.  Skin:  Negative for rash.  Neurological:  Negative for dizziness, loss of consciousness and headaches.  Endo/Heme/Allergies:  Negative for environmental allergies.  Psychiatric/Behavioral:  Negative for depression. The patient is not nervous/anxious.        Objective:    Physical Exam Vitals and nursing note reviewed.  Constitutional:      General: She is not in acute distress.    Appearance: Normal appearance. She is well-developed.  HENT:     Head: Normocephalic and atraumatic.  Eyes:     General: No scleral icterus.       Right eye: No discharge.        Left eye: No discharge.  Cardiovascular:     Rate and Rhythm: Normal rate and regular rhythm.     Heart sounds: No murmur heard. Pulmonary:     Effort: Pulmonary effort is normal. No respiratory distress.     Breath sounds: Normal breath sounds.  Musculoskeletal:        General: Tenderness present. Normal range of motion.     Cervical back: Normal range of motion and neck supple.     Right lower leg: No edema.     Left lower leg: No edema.  Skin:    General: Skin is warm and dry.  Neurological:     Mental Status: She is alert and oriented to person, place, and time.  Psychiatric:        Mood and Affect: Mood normal.        Behavior: Behavior normal.         Thought Content: Thought content normal.        Judgment: Judgment normal.     BP 98/70 (BP Location: Right Arm, Patient Position: Sitting, Cuff Size: Normal)   Pulse 74   Temp 98.6 F (37 C) (Oral)   Resp 18   Ht 5' 2 (1.575 m)   Wt 161 lb (73 kg)   SpO2 97%   BMI 29.45 kg/m  Wt Readings from Last 3 Encounters:  11/05/23 161 lb (73 kg)  07/30/23 154 lb 12.8 oz (70.2 kg)  05/15/23 156 lb (70.8 kg)    Diabetic Foot Exam - Simple   No data filed    Lab Results  Component Value Date   WBC 6.3 11/05/2023   HGB 14.5 11/05/2023   HCT 44.0 11/05/2023   PLT 237.0 11/05/2023   GLUCOSE 89 11/05/2023   CHOL 202 (H) 11/05/2023   TRIG 75.0 11/05/2023   HDL 67.30 11/05/2023   LDLCALC 120 (H) 11/05/2023   ALT 44 (H) 11/05/2023   AST 34 11/05/2023  NA 141 11/05/2023   K 4.9 11/05/2023   CL 102 11/05/2023   CREATININE 0.91 11/05/2023   BUN 20 11/05/2023   CO2 31 11/05/2023   TSH 4.30 11/05/2023   INR 1.0 08/13/2023   HGBA1C 5.3 07/30/2023    Lab Results  Component Value Date   TSH 4.30 11/05/2023   Lab Results  Component Value Date   WBC 6.3 11/05/2023   HGB 14.5 11/05/2023   HCT 44.0 11/05/2023   MCV 92.2 11/05/2023   PLT 237.0 11/05/2023   Lab Results  Component Value Date   NA 141 11/05/2023   K 4.9 11/05/2023   CO2 31 11/05/2023   GLUCOSE 89 11/05/2023   BUN 20 11/05/2023   CREATININE 0.91 11/05/2023   BILITOT 0.3 11/05/2023   ALKPHOS 87 11/05/2023   AST 34 11/05/2023   ALT 44 (H) 11/05/2023   PROT 7.1 11/05/2023   ALBUMIN 4.4 11/05/2023   CALCIUM 9.2 11/05/2023   ANIONGAP 6 12/13/2022   GFR 69.51 11/05/2023   Lab Results  Component Value Date   CHOL 202 (H) 11/05/2023   Lab Results  Component Value Date   HDL 67.30 11/05/2023   Lab Results  Component Value Date   LDLCALC 120 (H) 11/05/2023   Lab Results  Component Value Date   TRIG 75.0 11/05/2023   Lab Results  Component Value Date   CHOLHDL 3 11/05/2023   Lab Results   Component Value Date   HGBA1C 5.3 07/30/2023       Assessment & Plan:  Need for influenza vaccination -     Flu vaccine trivalent PF, 6mos and older(Flulaval,Afluria,Fluarix,Fluzone)  RLS (restless legs syndrome) -     CBC with Differential/Platelet -     Comprehensive metabolic panel with GFR -     Iron, TIBC and Ferritin Panel  Iron deficiency anemia, unspecified iron deficiency anemia type  Hyperlipidemia associated with type 2 diabetes mellitus (HCC) -     Comprehensive metabolic panel with GFR -     Lipid panel -     TSH -     Iron, TIBC and Ferritin Panel  Cervical stenosis of spine Assessment & Plan: Neurosurgery pending    Essential hypertension Assessment & Plan: Well controlled, no changes to meds. Encouraged heart healthy diet such as the DASH diet and exercise as tolerated.     Irritable bowel syndrome with diarrhea Assessment & Plan: Per GI   Assessment and Plan Assessment & Plan Chronic neck and back pain   Chronic neck and back pain persists despite previous interventions, including injections from temple to shoulder. High-risk surgical options were discussed, with potential paralysis as a risk. She prefers conservative management due to these risks and her inability to take anti-inflammatories post-gastric bypass. Consider referral to pain management for further evaluation. Document chronic pain for disability application.  Chronic bilateral knee pain, status post right knee surgery   Chronic bilateral knee pain continues, with a history of right knee surgery. Left knee pain persists and may require surgical intervention, but she prefers to delay surgery until December 2026. Monitor knee pain and consider surgical intervention for the left knee in December 2026.  Restless legs syndrome   Persistent restless legs syndrome, primarily affecting the left leg, causes significant discomfort and sleep disturbance. Symptoms worsen in the evening and are  exacerbated by daytime activity. The current Requip  dosage may be insufficient. Increase Requip  to 2 mg and assess response. Consider referral to neurology if symptoms persist.  Depression,  anxiety disorder, and PTSD   Ongoing management of depression, anxiety disorder, and PTSD continues with Seroquel , Zoloft , and BuSpar . She reports memory issues potentially related to medication. Monitor for cognitive side effects.  Gastroesophageal reflux disease (GERD)   GERD is managed with omeprazole .  Irritable bowel syndrome   Irritable bowel syndrome presents with intermittent symptoms, with no acute issues discussed.  Edema   Chronic edema is not responsive to current diuretic therapy, suggesting possible non-fluid related swelling. Reassess diuretic therapy if swelling persists.  Hyperlipidemia   Hyperlipidemia is managed with flaxseed. She requests blood work to reassess cholesterol levels. Order a lipid panel to assess cholesterol levels.  Encounter for immunization   She is due for a flu vaccination and is scheduled to receive it during the visit.    Graceland Wachter R Lowne Chase, DO

## 2023-11-05 NOTE — Assessment & Plan Note (Signed)
 Well controlled, no changes to meds. Encouraged heart healthy diet such as the DASH diet and exercise as tolerated.

## 2023-11-05 NOTE — Assessment & Plan Note (Signed)
 Neurosurgery pending

## 2023-11-06 LAB — CBC WITH DIFFERENTIAL/PLATELET
Basophils Absolute: 0.1 K/uL (ref 0.0–0.1)
Basophils Relative: 0.9 % (ref 0.0–3.0)
Eosinophils Absolute: 0.4 K/uL (ref 0.0–0.7)
Eosinophils Relative: 6.5 % — ABNORMAL HIGH (ref 0.0–5.0)
HCT: 44 % (ref 36.0–46.0)
Hemoglobin: 14.5 g/dL (ref 12.0–15.0)
Lymphocytes Relative: 31.5 % (ref 12.0–46.0)
Lymphs Abs: 2 K/uL (ref 0.7–4.0)
MCHC: 33.1 g/dL (ref 30.0–36.0)
MCV: 92.2 fl (ref 78.0–100.0)
Monocytes Absolute: 0.5 K/uL (ref 0.1–1.0)
Monocytes Relative: 7.2 % (ref 3.0–12.0)
Neutro Abs: 3.4 K/uL (ref 1.4–7.7)
Neutrophils Relative %: 53.9 % (ref 43.0–77.0)
Platelets: 237 K/uL (ref 150.0–400.0)
RBC: 4.77 Mil/uL (ref 3.87–5.11)
RDW: 13.6 % (ref 11.5–15.5)
WBC: 6.3 K/uL (ref 4.0–10.5)

## 2023-11-06 LAB — LIPID PANEL
Cholesterol: 202 mg/dL — ABNORMAL HIGH (ref 0–200)
HDL: 67.3 mg/dL (ref >39.00)
LDL Cholesterol: 120 mg/dL — ABNORMAL HIGH (ref 0–99)
NonHDL: 134.93
Total CHOL/HDL Ratio: 3
Triglycerides: 75 mg/dL (ref 0.0–149.0)
VLDL: 15 mg/dL (ref 0.0–40.0)

## 2023-11-06 LAB — COMPREHENSIVE METABOLIC PANEL WITH GFR
ALT: 44 U/L — ABNORMAL HIGH (ref 0–35)
AST: 34 U/L (ref 0–37)
Albumin: 4.4 g/dL (ref 3.5–5.2)
Alkaline Phosphatase: 87 U/L (ref 39–117)
BUN: 20 mg/dL (ref 6–23)
CO2: 31 meq/L (ref 19–32)
Calcium: 9.2 mg/dL (ref 8.4–10.5)
Chloride: 102 meq/L (ref 96–112)
Creatinine, Ser: 0.91 mg/dL (ref 0.40–1.20)
GFR: 69.51 mL/min (ref >60.00)
Glucose, Bld: 89 mg/dL (ref 70–99)
Potassium: 4.9 meq/L (ref 3.5–5.1)
Sodium: 141 meq/L (ref 135–145)
Total Bilirubin: 0.3 mg/dL (ref 0.2–1.2)
Total Protein: 7.1 g/dL (ref 6.0–8.3)

## 2023-11-06 LAB — IRON,TIBC AND FERRITIN PANEL
%SAT: 19 % (ref 16–45)
Ferritin: 20 ng/mL (ref 16–232)
Iron: 75 ug/dL (ref 45–160)
TIBC: 401 ug/dL (ref 250–450)

## 2023-11-06 LAB — TSH: TSH: 4.3 u[IU]/mL (ref 0.35–5.50)

## 2023-11-10 ENCOUNTER — Encounter: Payer: Self-pay | Admitting: Family Medicine

## 2023-11-12 DIAGNOSIS — K580 Irritable bowel syndrome with diarrhea: Secondary | ICD-10-CM | POA: Insufficient documentation

## 2023-11-12 NOTE — Assessment & Plan Note (Signed)
 Per GI

## 2023-11-13 ENCOUNTER — Ambulatory Visit (INDEPENDENT_AMBULATORY_CARE_PROVIDER_SITE_OTHER): Admitting: Licensed Clinical Social Worker

## 2023-11-13 DIAGNOSIS — F4310 Post-traumatic stress disorder, unspecified: Secondary | ICD-10-CM

## 2023-11-13 DIAGNOSIS — F4010 Social phobia, unspecified: Secondary | ICD-10-CM | POA: Diagnosis not present

## 2023-11-13 DIAGNOSIS — F3341 Major depressive disorder, recurrent, in partial remission: Secondary | ICD-10-CM

## 2023-11-13 NOTE — Progress Notes (Signed)
 THERAPIST PROGRESS NOTE  Session Time: 10:5-10:50am  Participation Level: Active  Behavioral Response: CasualAlertEuthymic  Type of Therapy: Individual Therapy  Treatment Goals addressed:  Active     BH CCP Acute or Chronic Trauma Reaction     LTG: Recall traumatic events without becoming overwhelmed with negative emotions (Completed/Met)     Start:  05/01/23    Expected End:  01/15/24    Resolved:  10/16/23    Goal Note     10/16/23: Im not overwhelmed with it. I just know it happened.          STG: Reduce the negative impact trauma related symptoms have on social, occupational, and family functioning per pt self-report 3 out of 5 sessions documented.   (Not Progressing)     Start:  05/01/23    Expected End:  01/15/24      05/01/23: 50% progress I have to push myself, but I know I got to get it done.     Goal Note     10/16/23: Reports she is not comfortable going out into crowds due to past experiences and current dynamics occurring in the world that interfere with her ability to feel safe. Shares she can go out with others but not alone. Patient notes some instances where she was able to be in large crowds or stores alone.          LTG:  Reduction in intrusive event recollections, avoidance of event reminders, intense arousal, or disinterest in activities or relationships  as evidenced by pt self report 3 out of 5 sessions documented.   (Completed/Met)     Start:  05/01/23    Expected End:  07/01/23    Resolved:  07/23/23   05/01/23: 50% progress I still have them quite often (2-3 a week) but reports she was experiencing them daily.     Goal Note     07/23/23: Reports she limits her social exposure to her family and best friend. Shares she is ok with this pattern and feels this is a safe way to ensure she has the support she needs to cope with stressors.          STG - Misc 1 (Completed/Met)     Start:  05/01/23    Expected End:  07/01/23    Resolved:   07/23/23   Goal: STG: [name] will identify internal and external stimuli that trigger PTSD symptoms  as evidenced by pt self report 3 out of 5 sessions documented.      05/01/23: 35% progress I try to avoid conflicts or in public and someone is having a fuss or screaming children.  Identifies she is able to utilize her coping skills to calm down.        STG: STG: [name] will acknowledge that healing from PTSD is a gradual process  as evidenced by pt self report 3 out of 5 sessions documented.   (Completed/Met)     Start:  05/01/23    Expected End:  07/01/23    Resolved:  05/01/23         05/01/23: 100% progress It's a real slow process for me.        STG:  [name] will identify negative coping strategies that have been used to cope with the feelings associated with the trauma  as evidenced by pt self report 3 out of 5 sessions documented.   (Progressing)     Start:  05/01/23    Expected End:  01/15/24  05/01/23: 50% progress My unhealthy was drinking all the time and with being sober 3 years and a couple of months. Acknowledged replacement and use of healthier coping skills. Denies feeling comfortable joining AA but shares she can reach out to her support system for words of encouragement.     Goal Note     10/16/23: Patient reports successful use of healthy coping skills to address trauma sxs.          LTG: Elimination of maladaptive behaviors and thinking patterns which interfere with resolution of trauma as evidenced by self reports and PCL5 scores (Progressing)     Start:  10/16/23    Expected End:  01/15/24       Goal Note     10/16/23: It's better. I don't blame myself for what happened.         Teach [name] coping strategies (e.g., writing down thoughts and feelings in a journal; taking deep, slow breaths; calling a support person to talk about memories) to deal with trauma memories and sudden emotional reactions without becoming emotionally n     Start:   05/01/23         Provide and outline the treatment process to [name], explaining that it will include a gradual processing of the details and feelings associated with the trauma and developing new, more appropriate coping strategies (Completed)     Start:  05/01/23    End:  10/16/23      Cooperate with trauma-focused psychotherapy techniques to reduce emotional reaction to the traumatic event  (Completed)     Start:  05/01/23    End:  10/16/23        OP Depression     LTG: Reduce frequency, intensity, and duration of depression symptoms so that daily functioning is improved     Start:  10/16/23    Expected End:  01/15/24         STG: [name] will identify cognitive patterns and beliefs that support depression     Start:  10/16/23    Expected End:  01/15/24         STG: Process grief related to feeling unloved within her marriage.      Start:  10/16/23    Expected End:  01/15/24         Work with [name] to identify the major components of a recent episode of depression: physical symptoms, major thoughts and images, and major behaviors they experienced     Start:  10/16/23         Shereena will identify 2 cognitive distortions they are currently using and write reframing statements to replace them     Start:  10/16/23            ProgressTowards Goals: Progressing  Interventions: Strength-based and Supportive  Summary: [name] is a 58 y.o. female who presents with symptoms of anxiety, depression, trauma. Patient identifies symptoms to include intrusive thoughts, reexperiencing, tearfulness, low mood, fatigue, difficulty sleeping, anxiety in large crowds. Pt was oriented times 5. Pt was cooperative and engaged. Pt denies SI/HI/AVH.   Patient utilized therapeutic space to process recent stressors citing chronic pain as a result of colder weather which is making her difficult to feel comfortable engaging in day-to-day tasks.  Patient reports she has been sleeping  okay has experienced occasional weird dreams/nightmares.  Patient also identifies unresolved grief as she has been thinking about her mother more as her birthday is coming up on November 2.  Patient notes states  progress in her ability to except uncontrollable factors in her life.  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  Patient states she now believes things happen for a reason.  Patient reports she also has continued to reframe negative cognitions related to the future and uncertainty regarding her disability.  Suicidal/Homicidal: Nowithout intent/plan  Therapist Response: Cln utilized active listening to create a safe space for patient to process recent life events. Assessed for safety, stressors, symptoms since last session.  Reflected on patient's progress and her ability to reframe on her own negative cognitions related to uncertainty around the future.  Plan: Return again in 3 weeks.  Diagnosis: PTSD (post-traumatic stress disorder)  Recurrent major depressive disorder, in partial remission  Social anxiety disorder   Collaboration of Care: AEB psychiatrist can access notes and cln. Will review psychiatrists' notes. Check in with the patient and will see LCSW per availability. Patient agreed with treatment recommendations.   Patient/Guardian was advised Release of Information must be obtained prior to any record release in order to collaborate their care with an outside provider. Patient/Guardian was advised if they have not already done so to contact the registration department to sign all necessary forms in order for us  to release information regarding their care.   Consent: Patient/Guardian gives verbal consent for treatment and assignment of benefits for services provided during this visit. Patient/Guardian expressed understanding and agreed to proceed.    [name] Husband, LCSW 11/13/2023

## 2023-11-14 ENCOUNTER — Other Ambulatory Visit: Payer: Self-pay

## 2023-11-14 ENCOUNTER — Ambulatory Visit: Payer: Self-pay | Admitting: Family Medicine

## 2023-11-14 ENCOUNTER — Other Ambulatory Visit: Payer: Self-pay | Admitting: Family Medicine

## 2023-11-14 ENCOUNTER — Other Ambulatory Visit: Payer: Self-pay | Admitting: Psychiatry

## 2023-11-14 DIAGNOSIS — F331 Major depressive disorder, recurrent, moderate: Secondary | ICD-10-CM

## 2023-11-14 DIAGNOSIS — F4010 Social phobia, unspecified: Secondary | ICD-10-CM

## 2023-11-14 DIAGNOSIS — F4310 Post-traumatic stress disorder, unspecified: Secondary | ICD-10-CM

## 2023-11-14 MED ORDER — ESTRADIOL 0.1 MG/24HR TD PTTW
1.0000 | MEDICATED_PATCH | TRANSDERMAL | 1 refills | Status: AC
  Filled 2023-11-14: qty 24, 84d supply, fill #0
  Filled 2024-02-27: qty 8, 28d supply, fill #1

## 2023-11-14 MED ORDER — BUSPIRONE HCL 30 MG PO TABS
30.0000 mg | ORAL_TABLET | Freq: Two times a day (BID) | ORAL | 5 refills | Status: AC
  Filled 2023-11-14: qty 60, 30d supply, fill #0
  Filled 2023-12-13: qty 60, 30d supply, fill #1
  Filled 2024-01-10: qty 60, 30d supply, fill #2
  Filled 2024-02-11: qty 60, 30d supply, fill #3

## 2023-11-26 ENCOUNTER — Encounter: Payer: Self-pay | Admitting: Radiology

## 2023-12-03 ENCOUNTER — Ambulatory Visit: Admitting: Orthopaedic Surgery

## 2023-12-04 ENCOUNTER — Ambulatory Visit: Admitting: Licensed Clinical Social Worker

## 2023-12-05 ENCOUNTER — Other Ambulatory Visit (INDEPENDENT_AMBULATORY_CARE_PROVIDER_SITE_OTHER)

## 2023-12-05 ENCOUNTER — Encounter: Payer: Self-pay | Admitting: Orthopaedic Surgery

## 2023-12-05 ENCOUNTER — Ambulatory Visit (INDEPENDENT_AMBULATORY_CARE_PROVIDER_SITE_OTHER): Admitting: Orthopaedic Surgery

## 2023-12-05 ENCOUNTER — Ambulatory Visit (INDEPENDENT_AMBULATORY_CARE_PROVIDER_SITE_OTHER): Admitting: Licensed Clinical Social Worker

## 2023-12-05 DIAGNOSIS — F4310 Post-traumatic stress disorder, unspecified: Secondary | ICD-10-CM

## 2023-12-05 DIAGNOSIS — F3341 Major depressive disorder, recurrent, in partial remission: Secondary | ICD-10-CM | POA: Diagnosis not present

## 2023-12-05 DIAGNOSIS — Z96651 Presence of right artificial knee joint: Secondary | ICD-10-CM

## 2023-12-05 DIAGNOSIS — F4010 Social phobia, unspecified: Secondary | ICD-10-CM | POA: Diagnosis not present

## 2023-12-05 NOTE — Progress Notes (Signed)
 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  Show the knee is doing well and states sore but overall it is stable.  She still has a lot of falls secondary to neck issues causing dizziness.  She does have a disability hearing coming up and I agree with her needing full body disability given her significant fall risk.  She has been having some left knee pain and is not at the point where it is waking her up at night to pursue anything for her left knee as of yet.  The right knee has good range of motion.  The incision is healed.  He feels ligamentously stable.  Her left knee has no effusion and is ligamentously stable good range of motion.  A standing AP and lateral of the right knee shows a well-seated press-fit total knee arthroplasty with no complicating features.  The left knee on the AP view shows good alignment with well-maintained joint space.  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  This point follow-up for her right knee can be as needed.  If things worsen at all with that knee or she has worsening problems her left knee she knows to reach out to us .

## 2023-12-05 NOTE — Progress Notes (Signed)
 THERAPIST PROGRESS NOTE  Session Time: 11:02-11:59am  Participation Level: Active  Behavioral Response: CasualAlertEuthymic  Type of Therapy: Individual Therapy  Treatment Goals addressed:  Active     BH CCP Acute or Chronic Trauma Reaction     LTG: Recall traumatic events without becoming overwhelmed with negative emotions (Completed/Met)     Start:  05/01/23    Expected End:  01/15/24    Resolved:  10/16/23    Goal Note     10/16/23: Im not overwhelmed with it. I just know it happened.          STG: Reduce the negative impact trauma related symptoms have on social, occupational, and family functioning per pt self-report 3 out of 5 sessions documented.   (Not Progressing)     Start:  05/01/23    Expected End:  01/15/24      05/01/23: 50% progress I have to push myself, but I know I got to get it done.     Goal Note     10/16/23: Reports she is not comfortable going out into crowds due to past experiences and current dynamics occurring in the world that interfere with her ability to feel safe. Shares she can go out with others but not alone. Patient notes some instances where she was able to be in large crowds or stores alone.          LTG:  Reduction in intrusive event recollections, avoidance of event reminders, intense arousal, or disinterest in activities or relationships  as evidenced by pt self report 3 out of 5 sessions documented.   (Completed/Met)     Start:  05/01/23    Expected End:  07/01/23    Resolved:  07/23/23   05/01/23: 50% progress I still have them quite often (2-3 a week) but reports she was experiencing them daily.     Goal Note     07/23/23: Reports she limits her social exposure to her family and best friend. Shares she is ok with this pattern and feels this is a safe way to ensure she has the support she needs to cope with stressors.          STG - Misc 1 (Completed/Met)     Start:  05/01/23    Expected End:  07/01/23    Resolved:   07/23/23   Goal: STG: Kendyl will identify internal and external stimuli that trigger PTSD symptoms  as evidenced by pt self report 3 out of 5 sessions documented.      05/01/23: 35% progress I try to avoid conflicts or in public and someone is having a fuss or screaming children.  Identifies she is able to utilize her coping skills to calm down.        STG: STG: Jojo will acknowledge that healing from PTSD is a gradual process  as evidenced by pt self report 3 out of 5 sessions documented.   (Completed/Met)     Start:  05/01/23    Expected End:  07/01/23    Resolved:  05/01/23         05/01/23: 100% progress It's a real slow process for me.        STG:  Ziyana will identify negative coping strategies that have been used to cope with the feelings associated with the trauma  as evidenced by pt self report 3 out of 5 sessions documented.   (Progressing)     Start:  05/01/23    Expected End:  01/15/24  05/01/23: 50% progress My unhealthy was drinking all the time and with being sober 3 years and a couple of months. Acknowledged replacement and use of healthier coping skills. Denies feeling comfortable joining AA but shares she can reach out to her support system for words of encouragement.     Goal Note     10/16/23: Patient reports successful use of healthy coping skills to address trauma sxs.          LTG: Elimination of maladaptive behaviors and thinking patterns which interfere with resolution of trauma as evidenced by self reports and PCL5 scores (Progressing)     Start:  10/16/23    Expected End:  01/15/24       Goal Note     10/16/23: It's better. I don't blame myself for what happened.         Teach Eulla coping strategies (e.g., writing down thoughts and feelings in a journal; taking deep, slow breaths; calling a support person to talk about memories) to deal with trauma memories and sudden emotional reactions without becoming emotionally n     Start:   05/01/23         Provide and outline the treatment process to Abbigale, explaining that it will include a gradual processing of the details and feelings associated with the trauma and developing new, more appropriate coping strategies (Completed)     Start:  05/01/23    End:  10/16/23      Cooperate with trauma-focused psychotherapy techniques to reduce emotional reaction to the traumatic event  (Completed)     Start:  05/01/23    End:  10/16/23        OP Depression     LTG: Reduce frequency, intensity, and duration of depression symptoms so that daily functioning is improved     Start:  10/16/23    Expected End:  01/15/24         STG: Myangel will identify cognitive patterns and beliefs that support depression     Start:  10/16/23    Expected End:  01/15/24         STG: Process grief related to feeling unloved within her marriage.      Start:  10/16/23    Expected End:  01/15/24         Work with [name] to identify the major components of a recent episode of depression: physical symptoms, major thoughts and images, and major behaviors they experienced     Start:  10/16/23         Detrice will identify 2 cognitive distortions they are currently using and write reframing statements to replace them     Start:  10/16/23            ProgressTowards Goals: Progressing  Interventions: CBT, Supportive, and Reframing  Summary: Dalisha Shively is a 58 y.o. female who presents with symptoms of anxiety, depression, trauma. Patient identifies symptoms to include intrusive thoughts, reexperiencing, tearfulness, low mood, fatigue, difficulty sleeping, anxiety in large crowds. Pt was oriented times 5. Pt was cooperative and engaged. Pt denies SI/HI/AVH.    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 She recalled a recent distressing experience with her father, which reminded her of  earlier difficult interactions with her mother when she was struggling with dementia. Together, we explored the lessons she learned from caring for her mother, as well as the controllable factors  in her situation.  The patient reported experiencing one nightmare in the past week, which made her feel the need to stay in her home for 48 hours afterward. We worked together to explore coping strategies and reframing techniques, focusing on her present safety.  Suicidal/Homicidal: Nowithout intent/plan  Therapist Response: Cln utilized active listening to create a safe space for patient to process recent life events. Assessed for safety, stressors, symptoms since last session.  Reframed to focus on controllable factors and solutions to manage patients uncertainty. Reviewed coping mechanisms for trauma symptoms.    Plan: Return again in 3 weeks.  Diagnosis: PTSD (post-traumatic stress disorder)  Social anxiety disorder  Recurrent major depressive disorder, in partial remission   Collaboration of Care: : AEB psychiatrist can access notes and cln. Will review psychiatrists' notes. Check in with the patient and will see LCSW per availability. Patient agreed with treatment recommendations.   Patient/Guardian was advised Release of Information must be obtained prior to any record release in order to collaborate their care with an outside provider. Patient/Guardian was advised if they have not already done so to contact the registration department to sign all necessary forms in order for us  to release information regarding their care.   Consent: Patient/Guardian gives verbal consent for treatment and assignment of benefits for services provided during this visit. Patient/Guardian expressed understanding and agreed to proceed.   [name] Husband, LCSW 12/05/2023

## 2023-12-12 ENCOUNTER — Telehealth (INDEPENDENT_AMBULATORY_CARE_PROVIDER_SITE_OTHER): Admitting: Psychiatry

## 2023-12-12 ENCOUNTER — Encounter: Payer: Self-pay | Admitting: Psychiatry

## 2023-12-12 ENCOUNTER — Other Ambulatory Visit: Payer: Self-pay

## 2023-12-12 DIAGNOSIS — F4010 Social phobia, unspecified: Secondary | ICD-10-CM | POA: Diagnosis not present

## 2023-12-12 DIAGNOSIS — F4310 Post-traumatic stress disorder, unspecified: Secondary | ICD-10-CM | POA: Diagnosis not present

## 2023-12-12 DIAGNOSIS — F3342 Major depressive disorder, recurrent, in full remission: Secondary | ICD-10-CM | POA: Diagnosis not present

## 2023-12-12 DIAGNOSIS — Z9189 Other specified personal risk factors, not elsewhere classified: Secondary | ICD-10-CM

## 2023-12-12 DIAGNOSIS — G2581 Restless legs syndrome: Secondary | ICD-10-CM | POA: Diagnosis not present

## 2023-12-12 DIAGNOSIS — F429 Obsessive-compulsive disorder, unspecified: Secondary | ICD-10-CM

## 2023-12-12 DIAGNOSIS — F1021 Alcohol dependence, in remission: Secondary | ICD-10-CM

## 2023-12-12 MED ORDER — QUETIAPINE FUMARATE 100 MG PO TABS
100.0000 mg | ORAL_TABLET | Freq: Every day | ORAL | 0 refills | Status: DC
  Filled 2023-12-12: qty 30, 30d supply, fill #0

## 2023-12-12 MED ORDER — HYDROXYZINE PAMOATE 25 MG PO CAPS
25.0000 mg | ORAL_CAPSULE | Freq: Two times a day (BID) | ORAL | 5 refills | Status: AC | PRN
  Filled 2023-12-12: qty 60, 30d supply, fill #0

## 2023-12-12 NOTE — Patient Instructions (Signed)
 Please call for [phone]
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
## 2023-12-18 ENCOUNTER — Ambulatory Visit
Admission: RE | Admit: 2023-12-18 | Discharge: 2023-12-18 | Disposition: A | Discharge status: Home / Self Care | Source: Ambulatory Visit | Attending: Family Medicine | Admitting: Family Medicine

## 2023-12-18 DIAGNOSIS — Z5181 Encounter for therapeutic drug level monitoring: Secondary | ICD-10-CM | POA: Diagnosis not present

## 2023-12-19 ENCOUNTER — Other Ambulatory Visit: Payer: Self-pay

## 2023-12-25 ENCOUNTER — Ambulatory Visit: Admitting: Licensed Clinical Social Worker

## 2023-12-25 DIAGNOSIS — F4010 Social phobia, unspecified: Secondary | ICD-10-CM

## 2023-12-25 DIAGNOSIS — F4310 Post-traumatic stress disorder, unspecified: Secondary | ICD-10-CM

## 2023-12-25 DIAGNOSIS — F3342 Major depressive disorder, recurrent, in full remission: Secondary | ICD-10-CM

## 2023-12-25 DIAGNOSIS — F429 Obsessive-compulsive disorder, unspecified: Secondary | ICD-10-CM

## 2023-12-25 NOTE — Progress Notes (Signed)
 THERAPIST PROGRESS NOTE  Virtual Visit via Video Note  I connected with [name] on 12/25/23 at 10:00 AM EST by a video enabled telemedicine application and verified that I am speaking with the correct person using two identifiers.  Location: Patient: Address on file  Provider: [facility]   I discussed the limitations of evaluation and management by telemedicine and the availability of in person appointments. The patient expressed understanding and agreed to proceed.  I discussed the assessment and treatment plan with the patient. The patient was provided an opportunity to ask questions and all were answered. The patient agreed with the plan and demonstrated an understanding of the instructions.   The patient was advised to call back or seek an in-person evaluation if the symptoms worsen or if the condition fails to improve as anticipated.  I provided 39 minutes of non-face-to-face time during this encounter.   [name] Husband, LCSW   Session Time: 10:01 am-10:39 am  Participation Level: Active  Behavioral Response: CasualAlertFatigued, tearful   Type of Therapy: Individual Therapy  Treatment Goals addressed:  Active     BH CCP Acute or Chronic Trauma Reaction     LTG: Recall traumatic events without becoming overwhelmed with negative emotions (Completed/Met)     Start:  05/01/23    Expected End:  01/15/24    Resolved:  10/16/23    Goal Note     10/16/23: Im not overwhelmed with it. I just know it happened.          STG: Reduce the negative impact trauma related symptoms have on social, occupational, and family functioning per pt self-report 3 out of 5 sessions documented.   (Not Progressing)     Start:  05/01/23    Expected End:  01/15/24      05/01/23: 50% progress I have to push myself, but I know I got to get it done.     Goal Note     10/16/23: Reports she is not comfortable going out into crowds due to past experiences and current dynamics occurring in  the world that interfere with her ability to feel safe. Shares she can go out with others but not alone. Patient notes some instances where she was able to be in large crowds or stores alone.          LTG:  Reduction in intrusive event recollections, avoidance of event reminders, intense arousal, or disinterest in activities or relationships  as evidenced by pt self report 3 out of 5 sessions documented.   (Completed/Met)     Start:  05/01/23    Expected End:  07/01/23    Resolved:  07/23/23   05/01/23: 50% progress I still have them quite often (2-3 a week) but reports she was experiencing them daily.     Goal Note     07/23/23: Reports she limits her social exposure to her family and best friend. Shares she is ok with this pattern and feels this is a safe way to ensure she has the support she needs to cope with stressors.          STG - Misc 1 (Completed/Met)     Start:  05/01/23    Expected End:  07/01/23    Resolved:  07/23/23   Goal: STG: Blaze will identify internal and external stimuli that trigger PTSD symptoms  as evidenced by pt self report 3 out of 5 sessions documented.      05/01/23: 35% progress I try to avoid  conflicts or in public and someone is having a fuss or screaming children.  Identifies she is able to utilize her coping skills to calm down.        STG: STG: Tye will acknowledge that healing from PTSD is a gradual process  as evidenced by pt self report 3 out of 5 sessions documented.   (Completed/Met)     Start:  05/01/23    Expected End:  07/01/23    Resolved:  05/01/23         05/01/23: 100% progress It's a real slow process for me.        STG:  [name] will identify negative coping strategies that have been used to cope with the feelings associated with the trauma  as evidenced by pt self report 3 out of 5 sessions documented.   (Progressing)     Start:  05/01/23    Expected End:  01/15/24      05/01/23: 50% progress My unhealthy was drinking all  the time and with being sober 3 years and a couple of months. Acknowledged replacement and use of healthier coping skills. Denies feeling comfortable joining AA but shares she can reach out to her support system for words of encouragement.     Goal Note     10/16/23: Patient reports successful use of healthy coping skills to address trauma sxs.          LTG: Elimination of maladaptive behaviors and thinking patterns which interfere with resolution of trauma as evidenced by self reports and PCL5 scores (Progressing)     Start:  10/16/23    Expected End:  01/15/24       Goal Note     10/16/23: It's better. I don't blame myself for what happened.         Teach Jady coping strategies (e.g., writing down thoughts and feelings in a journal; taking deep, slow breaths; calling a support person to talk about memories) to deal with trauma memories and sudden emotional reactions without becoming emotionally n     Start:  05/01/23         Provide and outline the treatment process to [name], explaining that it will include a gradual processing of the details and feelings associated with the trauma and developing new, more appropriate coping strategies (Completed)     Start:  05/01/23    End:  10/16/23      Cooperate with trauma-focused psychotherapy techniques to reduce emotional reaction to the traumatic event  (Completed)     Start:  05/01/23    End:  10/16/23        OP Depression     LTG: Reduce frequency, intensity, and duration of depression symptoms so that daily functioning is improved     Start:  10/16/23    Expected End:  01/15/24         STG: [name] will identify cognitive patterns and beliefs that support depression     Start:  10/16/23    Expected End:  01/15/24         STG: Process grief related to feeling unloved within her marriage.      Start:  10/16/23    Expected End:  01/15/24         Work with [name] to identify the major components of a recent  episode of depression: physical symptoms, major thoughts and images, and major behaviors they experienced     Start:  10/16/23  [name] will identify 2 cognitive distortions they are currently using and write reframing statements to replace them     Start:  10/16/23            ProgressTowards Goals: Progressing  Interventions: CBT, Supportive, and Reframing  Summary: Katanya Schlie is a 58 y.o. female who presents with symptoms of anxiety, depression, trauma. Patient identifies symptoms to include intrusive thoughts, reexperiencing, tearfulness, low mood, fatigue, difficulty sleeping, anxiety in large crowds. Pt was oriented times 5. Pt was cooperative and engaged. Pt denies SI/HI/AVH.    The patient reported that she was able to manage her anxiety over the recent holidays.  Her dad recently raised his voice, which upset her. She reflected on emotional triggers related to her father's worsening condition and became tearful while contemplating the encounter. 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 She expressed feelings that the men in her life do not believe she is capable of being independent, which interferes with her confidence. This pattern stems from her childhood. We explored how she can cope with these feelings, including reminding herself of her accomplishments and utilizing coping skills from EMDR therapy. Additionally, we discussed ways to separate her father from his dementia diagnosis and rationalize his behaviors. We also explored her feelings about seeking help this time, including considering a support group for herself and her sister for those caring for loved ones with memory care diagnoses.  The discussion included exploring self-care strategies.   Explored patients understanding of trauma symptoms worsening per psychiatrist. Revisited patients understanding of her window tolerance.    Suicidal/Homicidal: Nowithout intent/plan  Therapist Response: Cln utilized active listening to create a safe space for patient to process recent life events. Assessed for safety, stressors, symptoms since last session.  Clinician allowed patient an opportunity to process recent trauma responses and connections to childhood experiences.  Addressed ways in which patient can reframe negative belief systems that often lead her to personalize her father's behaviors as a result of dealing with a diagnosis of dementia.  Worked through techniques through CBT to separate interactions with her father from his diagnosis as well as ways in which she can focus on giving herself grace during this time.  Reflected on self-care strategies and reviewed brief education around trauma responses.  Plan: Return again in 3 weeks.  Diagnosis: PTSD (post-traumatic stress disorder)  Recurrent major depressive disorder, in full remission  Social anxiety disorder  Obsessive-compulsive disorder, unspecified type   Collaboration of Care: [facility] psychiatrist can access notes and cln. Will review psychiatrists' notes. Check in with the patient and will see LCSW per availability. Patient agreed with treatment recommendations.   Patient/Guardian was advised Release of Information must be obtained prior to any record release in order to collaborate their care with an outside provider. Patient/Guardian was advised if they have not already done so to contact the registration department to sign all necessary forms in order for us  to release information regarding their care.   Consent: Patient/Guardian gives verbal consent for treatment and assignment of benefits for services provided during this visit. Patient/Guardian expressed understanding and agreed to proceed.   [name] Husband, LCSW 12/25/2023

## 2023-12-28 ENCOUNTER — Telehealth: Admitting: Psychiatry

## 2023-12-28 ENCOUNTER — Encounter: Payer: Self-pay | Admitting: Psychiatry

## 2023-12-28 ENCOUNTER — Other Ambulatory Visit: Payer: Self-pay

## 2023-12-28 DIAGNOSIS — F4310 Post-traumatic stress disorder, unspecified: Secondary | ICD-10-CM

## 2023-12-28 DIAGNOSIS — G2581 Restless legs syndrome: Secondary | ICD-10-CM | POA: Diagnosis not present

## 2023-12-28 DIAGNOSIS — F1021 Alcohol dependence, in remission: Secondary | ICD-10-CM

## 2023-12-28 DIAGNOSIS — F3342 Major depressive disorder, recurrent, in full remission: Secondary | ICD-10-CM | POA: Diagnosis not present

## 2023-12-28 DIAGNOSIS — F429 Obsessive-compulsive disorder, unspecified: Secondary | ICD-10-CM

## 2023-12-28 DIAGNOSIS — F4010 Social phobia, unspecified: Secondary | ICD-10-CM

## 2023-12-28 MED ORDER — QUETIAPINE FUMARATE 100 MG PO TABS
100.0000 mg | ORAL_TABLET | Freq: Every day | ORAL | 0 refills | Status: AC
  Filled 2023-12-28 – 2024-01-10 (×2): qty 90, 90d supply, fill #0

## 2023-12-28 NOTE — Progress Notes (Signed)
 Virtual Visit via Video Note  I connected with [name] on 12/28/23 at  8:30 AM EST by a video enabled telemedicine application and verified that I am speaking with the correct person using two identifiers.  Location Provider Location : [facility] Patient Location : Home  Participants: Patient , Provider    I discussed the limitations of evaluation and management by telemedicine and the availability of in person appointments. The patient expressed understanding and agreed to proceed.  I discussed the assessment and treatment plan with the patient. The patient was provided an opportunity to ask questions and all were answered. The patient agreed with the plan and demonstrated an understanding of the instructions.   The patient was advised to call back or seek an in-person evaluation if the symptoms worsen or if the condition fails to improve as anticipated.   BH MD OP Progress Note  12/28/2023 10:29 AM [name]  MRN:  [id number]  Chief Complaint:  Chief Complaint  Patient presents with   Follow-up   Depression   Anxiety   Medication Refill   Discussed the use of AI scribe software for clinical note transcription with the patient, who gave verbal consent to proceed.  History of Present Illness [name] is a 58 year old Caucasian female, married, currently awaiting Social Security disability approval, has a history of PTSD, MDD, OCD, social anxiety disorder, RLS, alcohol use disorder in remission, chronic pain, [place] was evaluated by telemedicine today.  She continues to focus on ongoing management of obsessive-compulsive symptoms, and she engages in therapy targeting OCD and coping strategies related to her father's dementia. Therapy sessions address daily challenges associated with her father's condition, memories from the past, and flare-ups of physical symptoms in public settings.  She has recently experienced increased stress related to the holidays  and her father's dementia. She reports experiencing unusual dreams, which she associates with either her current illness or a combination of Seroquel  and cough medicine she has taken for several nights. She states that she continues to sleep, but she finds the dreams notable.  Her current medication regimen includes Seroquel  100 mg, which she recently increased, and she notes that she is sleeping but has experienced unusual dreams. She also takes sertraline  200 mg daily, hydroxyzine  as needed (approximately 1 to 3 times per week), and Buspar  as needed.   She denies any thoughts of harming herself or others.  She attended a family dinner with approximately 25 extended family members for Thanksgiving. She has an upcoming legal hearing related to disability benefits.    Visit Diagnosis:    ICD-10-CM   1. PTSD (post-traumatic stress disorder)  F43.10 QUEtiapine  (SEROQUEL ) 100 MG tablet    2. Recurrent major depressive disorder, in full remission  F33.42 QUEtiapine  (SEROQUEL ) 100 MG tablet    3. Social anxiety disorder  F40.10     4. RLS (restless legs syndrome)  G25.81     5. Obsessive-compulsive disorder, unspecified type  F42.9     6. Alcohol use disorder, severe, in sustained remission (HCC)  F10.21       Past Psychiatric History: I have reviewed past psychiatric history from progress note on 04/04/2021.  Trials of medications like Wellbutrin, Zoloft , Xanax, Cymbalta, Pristiq, BuSpar , Deplin, Lexapro , Valium , Viibryd, Trintellix, Lamictal .  Patient had neuropsychological testing completed-05/05/2021-Dr. Rodenbough-does not meet criteria for ADHD.  Evaluated again on 06/01/2022-patient was recommended to continue psychiatric treatment for PTSD, depression, sleep at that visit.  Past Medical History:  Past Medical History:  Diagnosis Date   Allergy    Anemia    Anxiety    Arthritis    Asthma    Depression    GERD (gastroesophageal reflux disease)    Heart murmur    from childhood- pt  had ECHO 12/27/21   Hiatal hernia    Hypertension    Peptic ulcer    PTSD (post-traumatic stress disorder)    UTI (urinary tract infection)     Past Surgical History:  Procedure Laterality Date   ABDOMINAL HYSTERECTOMY     fibroids   BIOPSY  07/11/2018   Procedure: BIOPSY;  Surgeon: Aneita Gwendlyn DASEN, MD;  Location: [facility] ENDOSCOPY;  Service: Endoscopy;;   BREAST BIOPSY Left    COLONOSCOPY  2014   ESOPHAGOGASTRODUODENOSCOPY (EGD) WITH PROPOFOL  N/A 07/11/2018   Procedure: ESOPHAGOGASTRODUODENOSCOPY (EGD) WITH PROPOFOL ;  Surgeon: Aneita Gwendlyn DASEN, MD;  Location: [facility] ENDOSCOPY;  Service: Endoscopy;  Laterality: N/A;   GASTRIC BYPASS  2007   HEMOSTASIS CLIP PLACEMENT  07/11/2018   Procedure: HEMOSTASIS CLIP PLACEMENT;  Surgeon: Aneita Gwendlyn DASEN, MD;  Location[facility] ENDOSCOPY;  Service: Endoscopy;;   LAPAROTOMY N/A 03/08/2018   Procedure: EXPLORATORY LAPAROTOMY, REPAIR AND PATCH CLOSURE OF PERFORATED ULCER;  Surgeon: Sebastian Moles, MD;  Location: [facility] OR;  Service: General;  Laterality: N/A;   REDUCTION MAMMAPLASTY Bilateral    TONSILLECTOMY     and adenoids removed as a child   TOTAL KNEE ARTHROPLASTY Right 12/12/2022   Procedure: RIGHT TOTAL KNEE ARTHROPLASTY;  Surgeon: Vernetta Lonni GRADE, MD;  Location: [facility] OR;  Service: Orthopedics;  Laterality: Right;   TUBAL LIGATION      Family Psychiatric History: I have reviewed family psychiatric history from progress note on 04/04/2021.  Family History:  Family History  Problem Relation Age of Onset   Stroke Mother    Alzheimer's disease Mother    Hypertension Father    Cancer Father        Prostate   Dementia Father    Alcohol abuse Sister    Depression Sister    Alcohol abuse Brother    Depression Brother    Diabetes Maternal Grandmother    Alzheimer's disease Maternal Grandmother    Diabetes Maternal Grandfather    Liver disease Maternal Grandfather    Cancer Maternal Grandfather        Prostate   Diabetes Paternal Grandmother     Alzheimer's disease Paternal Grandmother    Diabetes Paternal Grandfather    Cancer Paternal Grandfather        Lung, Prostate   Colon cancer Neg Hx    Esophageal cancer Neg Hx    Pancreatic cancer Neg Hx    Stomach cancer Neg Hx    Colon polyps Neg Hx    Rectal cancer Neg Hx     Social History: I have reviewed social history from progress note on 04/04/2021. Social History   Socioeconomic History   Marital status: Married    Spouse name: mark   Number of children: 0   Years of education: Not on file   Highest education level: 12th grade  Occupational History    Employer: [employer]    Comment: [employer]  Tobacco Use   Smoking status: Former    Current packs/day: 0.00    Types: Cigarettes    Quit date: 01/24/1987    Years since quitting: 36.9   Smokeless tobacco: Never  Vaping Use   Vaping status: Former   Substances: Flavoring   Devices: has vaped with flavor but  no nicotine. Been 8 years  Substance and Sexual Activity   Alcohol use: Not Currently    Comment: pt has not drank alcohol since 02/2020   Drug use: No   Sexual activity: Not Currently  Other Topics Concern   Not on file  Social History Narrative   Regular exercise--no (because of knees)   Social Drivers of Health   Financial Resource Strain: Low Risk  (07/30/2023)   Overall Financial Resource Strain (CARDIA)    Difficulty of Paying Living Expenses: Not very hard  Food Insecurity: No Food Insecurity (07/30/2023)   Hunger Vital Sign    Worried About Running Out of Food in the Last Year: Never true    Ran Out of Food in the Last Year: Never true  Transportation Needs: No Transportation Needs (07/30/2023)   PRAPARE - Administrator, Civil Service (Medical): No    Lack of Transportation (Non-Medical): No  Physical Activity: Inactive (11/05/2023)   Exercise Vital Sign    Days of Exercise per Week: 0 days    Minutes of Exercise per Session: Not on file  Stress: Stress Concern Present  (11/05/2023)   [name] of Occupational Health - Occupational Stress Questionnaire    Feeling of Stress: Very much  Social Connections: Moderately Integrated (11/05/2023)   Social Connection and Isolation Panel    Frequency of Communication with Friends and Family: More than three times a week    Frequency of Social Gatherings with Friends and Family: Twice a week    Attends Religious Services: More than 4 times per year    Active Member of [employer] or Organizations: No    Attends Engineer, Structural: Not on file    Marital Status: Married    Allergies:  Allergies  Allergen Reactions   Lamictal  [Lamotrigine ] Rash   Penicillins Other (See Comments)    Did it involve swelling of the face/tongue/throat, SOB, or low BP? Unk Did it involve sudden or severe rash/hives, skin peeling, or any reaction on the inside of your mouth or nose? Unk Did you need to seek medical attention at a hospital or doctor's office? Unk When did it last happen? Was 58 years old; reaction not recalled     If all above answers are NO, may proceed with cephalosporin use.    Codeine  Itching    Crazy dreams and itching-pt said she can take it with food    Metabolic Disorder Labs: Lab Results  Component Value Date   HGBA1C 5.3 07/30/2023   MPG 105.41 05/24/2022   Lab Results  Component Value Date   PROLACTIN 16.9 05/24/2022   Lab Results  Component Value Date   CHOL 202 (H) 11/05/2023   TRIG 75.0 11/05/2023   HDL 67.30 11/05/2023   CHOLHDL 3 11/05/2023   VLDL 15.0 11/05/2023   LDLCALC 120 (H) 11/05/2023   LDLCALC 145 (H) 07/30/2023   Lab Results  Component Value Date   TSH 4.30 11/05/2023   TSH 3.15 08/04/2022    Therapeutic Level Labs: No results found for: LITHIUM No results found for: VALPROATE No results found for: CBMZ  Current Medications: Current Outpatient Medications  Medication Sig Dispense Refill   acetaminophen  (TYLENOL ) 650 MG CR tablet Take 1,300 mg by  mouth every 8 (eight) hours as needed (pain).     busPIRone  (BUSPAR ) 30 MG tablet Take 1 tablet (30 mg total) by mouth 2 (two) times daily. 60 tablet 5   Cholecalciferol  (VITAMIN D3) 50 MCG (2000 UT) capsule  Take 1 capsule (2,000 Units total) by mouth daily. 100 capsule 0   clotrimazole -betamethasone  (LOTRISONE ) cream Apply 1 Application topically daily. 30 g 0   cyanocobalamin  (VITAMIN B12) 1000 MCG/ML injection Inject 1 mL (1,000 mcg total) into the muscle every 30 (thirty) days. 10 mL 0   cyclobenzaprine  (FLEXERIL ) 10 MG tablet Take 1 tablet (10 mg total) by mouth 3 (three) times daily as needed. for muscle spams 30 tablet 2   Diclofenac  Sodium 2 % SOLN Apply 1 pump twice daily as needed. 112 g 1   diphenoxylate -atropine  (LOMOTIL ) 2.5-0.025 MG tablet Take 1 tablet by mouth 4 (four) times daily as needed for diarrhea or loose stools. 90 tablet 1   estradiol  (ESTRACE ) 0.1 MG/GM vaginal cream Apply a pea-sized amount of cream to the fingertip and wipe in the front part of the vagina twice weekly 42.5 g 0   estradiol  (VIVELLE -DOT) 0.1 MG/24HR patch Place 1 patch (0.1 mg total) onto the skin 2 (two) times a week. 24 patch 1   fluticasone  (FLONASE ) 50 MCG/ACT nasal spray Place 2 sprays into both nostrils daily. 16 g 5   furosemide  (LASIX ) 40 MG tablet Take 1 tablet (40 mg total) by mouth daily. 90 tablet 0   Homeopathic Products (ALLERGY MEDICINE PO) Take 1 tablet by mouth daily as needed (Takes the Clortabs. Equate Brand for allergies).     hydrOXYzine  (VISTARIL ) 25 MG capsule Take 1 capsule (25 mg total) by mouth 2 (two) times daily as needed for anxiety. 60 capsule 5   omeprazole  (PRILOSEC) 40 MG capsule Take 1 capsule (40 mg total) by mouth 2 (two) times daily. OPEN CAPSULE AND SPRINKLE ON APPLESAUCE. 60 capsule 4   OVER THE COUNTER MEDICATION Take 2 capsules by mouth daily. Mushroom Complex     Probiotic Product (ALIGN) CHEW Chew 2 tablets by mouth daily. Align pre and probiotic with vitamain  C-take one tablet each Align pre and probiotic with Vitamin b12 for immune and energy-take one tablet each     QUEtiapine  (SEROQUEL ) 100 MG tablet Take 1 tablet (100 mg total) by mouth at bedtime. 90 tablet 0   rOPINIRole  (REQUIP ) 1 MG tablet Take 1 tablet (1 mg total) by mouth at bedtime. 90 tablet 1   sertraline  (ZOLOFT ) 100 MG tablet Take 2 tablets (200 mg total) by mouth daily. 180 tablet 1   SYRINGE-NEEDLE, DISP, 3 ML (B-D 3CC LUER-LOK SYR 25GX5/8) 25G X 5/8 3 ML MISC USE AS DIRECTED WITH B-12 INJECTION 16 each 2   No current facility-administered medications for this visit.     Musculoskeletal: Strength & Muscle Tone: UTA Gait & Station: Seated Patient leans: N/A  Psychiatric Specialty Exam: Review of Systems  Psychiatric/Behavioral:  Positive for sleep disturbance. The patient is nervous/anxious.     There were no vitals taken for this visit.There is no height or weight on file to calculate BMI.  General Appearance: Casual  Eye Contact:  Fair  Speech:  Clear and Coherent  Volume:  Normal  Mood:  Anxious  Affect:  Congruent  Thought Process:  Goal Directed and Descriptions of Associations: Intact  Orientation:  Full (Time, Place, and Person)  Thought Content: Logical   Suicidal Thoughts:  No  Homicidal Thoughts:  No  Memory:  Immediate;   Fair Recent;   Fair Remote;   Fair  Judgement:  Fair  Insight:  Fair  Psychomotor Activity:  Normal  Concentration:  Concentration: Fair and Attention Span: Fair  Recall:  [employer] of  Knowledge: Fair  Language: Fair  Akathisia:  No  Handed:  Right  AIMS (if indicated): not done  Assets:  Communication Skills Desire for Improvement Housing Social Support Talents/Skills Transportation  ADL's:  Intact  Cognition: WNL  Sleep:  Varies due to currently being sick   Screenings: Geneticist, Molecular Office Visit from 05/23/2023 in [facility] Office Visit from 11/01/2022 in [facility] Visit from 03/03/2022 in [facility] Visit from 01/20/2022 in [facility] Office Visit from 11/17/2021 in [facility]  AIMS Total Score 0 0 0 0 0   GAD-7    Flowsheet Row Counselor from 10/16/2023 in [facility] Counselor from 07/23/2023 in [facility] Office Visit from 05/23/2023 in [facility] Counselor from 05/01/2023 in [facility] Counselor from 11/07/2022 in [facility]  Total GAD-7 Score 18 10 17 15 21    PHQ2-9    Flowsheet Row Counselor from 10/16/2023 in [facility] Counselor from 07/23/2023 in [facility] Office Visit from 05/23/2023 in [facility] Counselor from 05/01/2023 in [facility] Counselor from 11/07/2022 in [facility]  PHQ-2 Total Score 2 4 4 4 6   PHQ-9 Total Score 13 10 16 9 20    Flowsheet Row Video Visit from 12/28/2023 in [facility] Video Visit from 12/12/2023 in [facility] Counselor from 10/16/2023 in [facility]  C-SSRS RISK CATEGORY Moderate Risk Moderate Risk Error: Q3, 4, or 5 should not be populated when Q2 is No     Assessment and Plan: Disa Riedlinger is a 58 year old Caucasian female who presented for follow-up appointment, discussed assessment and plan as noted below.  1. PTSD (post-traumatic stress disorder)-improving Family reports some improvement in trauma-related  symptoms although continues to have anxiety and sleep problems.  Does report some nightmares.  Sleep problems also due to currently having upper respiratory tract infection symptoms Continue Seroquel  100 mg daily at bedtime Continue Zoloft  200 mg daily Continue BuSpar  30 mg twice daily Continue psychotherapy sessions with Ms. Evalene Husband.  2. Recurrent major depressive disorder, in full remission Currently denies any significant depression symptoms Continue Zoloft  and BuSpar  as prescribed  3. Social anxiety disorder-improving Reports improvement and recently was able to attend a wake family gathering during Thanksgiving. Encouraged to continue psychotherapy sessions  4. RLS (restless legs syndrome)-improving Currently reports improvement Continue Ropinirole  1 mg at bedtime Continue Ambien  5 mg at bedtime as needed-rarely uses it  5. Obsessive-compulsive disorder, unspecified type-improving Reports improvement did not mention any significant obsessions during this visit. Will reevaluate in future sessions.  6.  Alcohol use disorder severe in sustained remission Currently sober  Follow-up Follow-up in clinic in  Collaboration of Care: Collaboration of Care: Referral or follow-up with counselor/therapist AEB encouraged to continue psychotherapy sessions  Patient/Guardian was advised Release of Information must be obtained prior to any record release in order to collaborate their care with an outside provider. Patient/Guardian was advised if they have not already done so to contact the registration department to sign all necessary forms in order for us  to release information regarding their care.  Consent: Patient/Guardian gives verbal consent for treatment and assignment of benefits for services provided during this visit. Patient/Guardian expressed understanding and agreed to proceed.  This note was generated in part or whole with voice recognition software. Voice recognition is  usually quite accurate but there are transcription errors that can and very often do occur. I apologize for any typographical errors that were not detected and corrected.     Odean Fester, MD 12/28/2023, 10:29 AM

## 2024-01-01 ENCOUNTER — Other Ambulatory Visit: Payer: Self-pay | Admitting: Family Medicine

## 2024-01-10 ENCOUNTER — Other Ambulatory Visit: Payer: Self-pay

## 2024-01-10 ENCOUNTER — Other Ambulatory Visit: Payer: Self-pay | Admitting: Family Medicine

## 2024-01-10 DIAGNOSIS — G2581 Restless legs syndrome: Secondary | ICD-10-CM

## 2024-01-10 MED ORDER — ROPINIROLE HCL 1 MG PO TABS
1.0000 mg | ORAL_TABLET | Freq: Every day | ORAL | 1 refills | Status: DC
  Filled 2024-01-10: qty 90, 90d supply, fill #0

## 2024-01-15 ENCOUNTER — Ambulatory Visit: Admitting: Licensed Clinical Social Worker

## 2024-01-15 DIAGNOSIS — F4310 Post-traumatic stress disorder, unspecified: Secondary | ICD-10-CM | POA: Diagnosis not present

## 2024-01-15 DIAGNOSIS — F429 Obsessive-compulsive disorder, unspecified: Secondary | ICD-10-CM | POA: Diagnosis not present

## 2024-01-15 DIAGNOSIS — F3342 Major depressive disorder, recurrent, in full remission: Secondary | ICD-10-CM

## 2024-01-15 DIAGNOSIS — F4010 Social phobia, unspecified: Secondary | ICD-10-CM

## 2024-01-15 NOTE — Progress Notes (Signed)
 "  THERAPIST PROGRESS NOTE  Session Time: 10:08-10:56am  Participation Level: Active   Behavioral Response: CasualAlertFatigued, tearful    Type of Therapy: Individual Therapy   Treatment Goals addressed:  Active       BH CCP Acute or Chronic Trauma Reaction       LTG: Recall traumatic events without becoming overwhelmed with negative emotions (Completed/Met)       Start:  05/01/23    Expected End:  01/15/24    Resolved:  10/16/23      Goal Note       10/16/23: Im not overwhelmed with it. I just know it happened.             STG: Reduce the negative impact trauma related symptoms have on social, occupational, and family functioning per pt self-report 3 out of 5 sessions documented.   (Not Progressing)       Start:  05/01/23    Expected End:  01/15/24       05/01/23: 50% progress I have to push myself, but I know I got to get it done.       Goal Note       10/16/23: Reports she is not comfortable going out into crowds due to past experiences and current dynamics occurring in the world that interfere with her ability to feel safe. Shares she can go out with others but not alone. Patient notes some instances where she was able to be in large crowds or stores alone.             LTG:  Reduction in intrusive event recollections, avoidance of event reminders, intense arousal, or disinterest in activities or relationships  as evidenced by pt self report 3 out of 5 sessions documented.   (Completed/Met)       Start:  05/01/23    Expected End:  07/01/23    Resolved:  07/23/23    05/01/23: 50% progress I still have them quite often (2-3 a week) but reports she was experiencing them daily.       Goal Note       07/23/23: Reports she limits her social exposure to her family and best friend. Shares she is ok with this pattern and feels this is a safe way to ensure she has the support she needs to cope with stressors.             STG - Misc 1 (Completed/Met)       Start:   05/01/23    Expected End:  07/01/23    Resolved:  07/23/23    Goal: STG: Airyn will identify internal and external stimuli that trigger PTSD symptoms  as evidenced by pt self report 3 out of 5 sessions documented.         05/01/23: 35% progress I try to avoid conflicts or in public and someone is having a fuss or screaming children.  Identifies she is able to utilize her coping skills to calm down.           STG: STG: Makala will acknowledge that healing from PTSD is a gradual process  as evidenced by pt self report 3 out of 5 sessions documented.   (Completed/Met)       Start:  05/01/23    Expected End:  07/01/23    Resolved:  05/01/23  05/01/23: 100% progress It's a real slow process for me.           STG:  Nocole will identify negative coping strategies that have been used to cope with the feelings associated with the trauma  as evidenced by pt self report 3 out of 5 sessions documented.   (Progressing)       Start:  05/01/23    Expected End:  01/15/24       05/01/23: 50% progress My unhealthy was drinking all the time and with being sober 3 years and a couple of months. Acknowledged replacement and use of healthier coping skills. Denies feeling comfortable joining AA but shares she can reach out to her support system for words of encouragement.       Goal Note       10/16/23: Patient reports successful use of healthy coping skills to address trauma sxs.             LTG: Elimination of maladaptive behaviors and thinking patterns which interfere with resolution of trauma as evidenced by self reports and PCL5 scores (Progressing)       Start:  10/16/23    Expected End:  01/15/24         Goal Note       10/16/23: It's better. I don't blame myself for what happened.            Teach Lacresia coping strategies (e.g., writing down thoughts and feelings in a journal; taking deep, slow breaths; calling a support person to talk about memories)  to deal with trauma memories and sudden emotional reactions without becoming emotionally n       Start:  05/01/23           Provide and outline the treatment process to Chiquita, explaining that it will include a gradual processing of the details and feelings associated with the trauma and developing new, more appropriate coping strategies (Completed)       Start:  05/01/23    End:  10/16/23        Cooperate with trauma-focused psychotherapy techniques to reduce emotional reaction to the traumatic event  (Completed)       Start:  05/01/23    End:  10/16/23          OP Depression       LTG: Reduce frequency, intensity, and duration of depression symptoms so that daily functioning is improved       Start:  10/16/23    Expected End:  01/15/24           STG: Saralyn will identify cognitive patterns and beliefs that support depression       Start:  10/16/23    Expected End:  01/15/24           STG: Process grief related to feeling unloved within her marriage.        Start:  10/16/23    Expected End:  01/15/24           Work with [name] to identify the major components of a recent episode of depression: physical symptoms, major thoughts and images, and major behaviors they experienced       Start:  10/16/23           Justiss will identify 2 cognitive distortions they are currently using and write reframing statements to replace them       Start:  10/16/23  ProgressTowards Goals: Progressing   Interventions: CBT, Supportive, and Reframing   Summary: Gabrial Poppell is a 58 y.o. female who presents with symptoms of anxiety, depression, trauma. Patient identifies symptoms to include intrusive thoughts, reexperiencing, tearfulness, low mood, fatigue, difficulty sleeping, anxiety in large crowds. Pt was oriented times 5. Pt was cooperative and engaged. Pt denies SI/HI/AVH.    The patient utilized the therapeutic space to process feelings of relief regarding her  recent approval for disability. She reported not sleeping well due to a cold, mentioning that she was awake until 6 AM the Friday before her appointment. The clinician explored sleep hygiene with the patient, who expressed an intention to take her medications at the same time each night to help establish a consistent bedtime. The patient also noted experiencing nightmares three times a month, in which someone is holding her down. The clinician assessed for stressors and found that financial stressors have been alleviated for the time being, so the patient will continue to monitor her sleep issues.  Additionally, the patient reflected on a distressing incident at a grocery store that led to paranoid thoughts, and she reported difficulty regulating her emotional responses, feeling hypervigilant. The clinician provided psychoeducation on anxiety and triggers, redirecting the patient to focus on factors she can control.  We discussed ways for the patient to challenge herself by staying in grocery stores, particularly when she is not facing triggers, as a form of exposure treatment.  The session included a review of coping mechanisms and strategies for the patient to acknowledge and vary her emotions as they arise.  We also explored self-care, and the patient set a goal to walk once a week in the new year. The clinician encouraged the patient to engage in hobbies that serve as creative outlets to help cope with trauma symptoms.  Suicidal/Homicidal: Nowithout intent/plan   Therapist Response: Cln utilized active listening to create a safe space for patient to process recent life events. Assessed for safety, stressors, symptoms since last session.  Clinician worked with patient to reflect on possible solutions to improving sleep hygiene.  Reflected on recent distressing situation leading to discovery of new triggers.  Revisited psychoeducation around fight, flight, freeze automatic trauma responses and ways in  which patient can reframe her perspective in an effort to recognize controllable factors.  Reviewed coping mechanisms.  Explored outlets patient can engage in to cope with trauma symptoms.   Plan: Return again in 3 weeks.   Diagnosis: PTSD (post-traumatic stress disorder)   Recurrent major depressive disorder, in full remission   Social anxiety disorder   Obsessive-compulsive disorder, unspecified type     Collaboration of Care: [facility] psychiatrist can access notes and cln. Will review psychiatrists' notes. Check in with the patient and will see LCSW per availability. Patient agreed with treatment recommendations.   Patient/Guardian was advised Release of Information must be obtained prior to any record release in order to collaborate their care with an outside provider. Patient/Guardian was advised if they have not already done so to contact the registration department to sign all necessary forms in order for us  to release information regarding their care.   Consent: Patient/Guardian gives verbal consent for treatment and assignment of benefits for services provided during this visit. Patient/Guardian expressed understanding and agreed to proceed.   [name] Husband, LCSW 01/15/2024  "

## 2024-01-25 ENCOUNTER — Ambulatory Visit: Payer: Self-pay

## 2024-01-25 NOTE — Telephone Encounter (Signed)
 FYI Only or Action Required?: FYI only for provider: appointment scheduled on 01/29/2024.  Patient was last seen in primary care on 11/05/2023 by Antonio Meth, Jamee SAUNDERS, DO.  Called Nurse Triage reporting Dizziness.  Symptoms began a week ago.  Interventions attempted: Nothing.  Symptoms are: unchanged.  Triage Disposition: See PCP Within 2 Weeks  Patient/caregiver understands and will follow disposition?: Yes  Copied from CRM [id number]. Topic: Clinical - Red Word Triage >> Jan 25, 2024  4:26 PM Thersia BROCKS wrote: Red Word that prompted transfer to Nurse Triage: patient called in stated when she sits up she falls right back down feelslike vertigo , has been happening all throughout the day Reason for Disposition  [1] MILD dizziness (e.g., vertigo; walking normally) AND [2] has been evaluated by doctor (or NP/PA) for this  Answer Assessment - Initial Assessment Questions Had some recent illnesses that have gotten better. Still having a little blood when she blows her nose.   1. DESCRIPTION: Describe your dizziness.     If she sits up in bed she feels like the room is spinning and has to lay down.  2. VERTIGO: Do you feel like either you or the room is spinning or tilting?      Room is spinning 3. LIGHTHEADED: Do you feel lightheaded? (e.g., somewhat faint, woozy, weak upon standing)     *No Answer* 4. SEVERITY: How bad is it?  Can you walk?     *No Answer* 5. ONSET:  When did the dizziness begin?     1 week 6. AGGRAVATING FACTORS: Does anything make it worse? (e.g., standing, change in head position)     Change position, sitting up.  7. CAUSE: What do you think is causing the dizziness?     *No Answer* 8. RECURRENT SYMPTOM: Have you had dizziness before? If Yes, ask: When was the last time? What happened that time?     Yes has happened a lot in the past but says this is a whole lot different. 9. OTHER SYMPTOMS: Do you have any other symptoms? (e.g., earache,  headache, numbness, tinnitus, vomiting, weakness)     *No Answer* 10. PREGNANCY: Is there any chance you are pregnant? When was your last menstrual period?       *No Answer*  Protocols used: Dizziness - Vertigo-A-AH

## 2024-01-29 ENCOUNTER — Other Ambulatory Visit: Payer: Self-pay

## 2024-01-29 ENCOUNTER — Other Ambulatory Visit (HOSPITAL_BASED_OUTPATIENT_CLINIC_OR_DEPARTMENT_OTHER): Payer: Self-pay

## 2024-01-29 ENCOUNTER — Encounter: Payer: Self-pay | Admitting: Family Medicine

## 2024-01-29 ENCOUNTER — Ambulatory Visit: Admitting: Family Medicine

## 2024-01-29 VITALS — BP 108/78 | HR 71 | Temp 98.9°F | Resp 18 | Ht 62.0 in | Wt 169.6 lb

## 2024-01-29 DIAGNOSIS — G2581 Restless legs syndrome: Secondary | ICD-10-CM | POA: Diagnosis not present

## 2024-01-29 DIAGNOSIS — J0110 Acute frontal sinusitis, unspecified: Secondary | ICD-10-CM | POA: Diagnosis not present

## 2024-01-29 DIAGNOSIS — H6502 Acute serous otitis media, left ear: Secondary | ICD-10-CM

## 2024-01-29 DIAGNOSIS — M79604 Pain in right leg: Secondary | ICD-10-CM

## 2024-01-29 MED ORDER — FUROSEMIDE 40 MG PO TABS
40.0000 mg | ORAL_TABLET | Freq: Every day | ORAL | 0 refills | Status: AC
  Filled 2024-01-29: qty 90, 90d supply, fill #0

## 2024-01-29 MED ORDER — PREDNISONE 10 MG PO TABS
ORAL_TABLET | ORAL | 0 refills | Status: AC
  Filled 2024-01-29: qty 20, 12d supply, fill #0

## 2024-01-29 MED ORDER — ROPINIROLE HCL 2 MG PO TABS
2.0000 mg | ORAL_TABLET | Freq: Every day | ORAL | 3 refills | Status: AC
  Filled 2024-01-29 – 2024-01-30 (×3): qty 90, 90d supply, fill #0

## 2024-01-29 MED ORDER — AZITHROMYCIN 250 MG PO TABS
ORAL_TABLET | ORAL | 0 refills | Status: AC
  Filled 2024-01-29: qty 6, 5d supply, fill #0

## 2024-01-29 MED ORDER — LEVOCETIRIZINE DIHYDROCHLORIDE 5 MG PO TABS: 5.0000 mg | ORAL_TABLET | Freq: Every evening | ORAL | Status: AC

## 2024-01-29 NOTE — Patient Instructions (Signed)
 How to Perform the Epley Maneuver The Epley maneuver is an exercise that relieves symptoms of vertigo. Vertigo is the feeling that you or your surroundings are moving when they are not. When you feel vertigo, you may feel like the room is spinning and may have trouble walking. The Epley maneuver is used for a type of vertigo caused by a calcium deposit in a part of the inner ear. The maneuver involves changing head positions to help the deposit move out of the area. You can do this maneuver at home whenever you have symptoms of vertigo. You can repeat it in 24 hours if your vertigo has not gone away. Even though the Epley maneuver may relieve your vertigo for a few weeks, it is possible that your symptoms will return. This maneuver relieves vertigo, but it does not relieve dizziness. What are the risks? If it is done correctly, the Epley maneuver is considered safe. Sometimes it can lead to dizziness or nausea that goes away after a short time. If you develop other symptoms--such as changes in vision, weakness, or numbness--stop doing the maneuver and call your health care provider. Supplies needed: A bed or table. A pillow. How to do the Epley maneuver     Sit on the edge of a bed or table with your back straight and your legs extended or hanging over the edge of the bed or table. Turn your head halfway toward the affected ear or side as told by your health care provider. Lie backward quickly with your head turned until you are lying flat on your back. Your head should dangle (head-hanging position). You may want to position a pillow under your shoulders. Hold this position for at least 30 seconds. If you feel dizzy or have symptoms of vertigo, continue to hold the position until the symptoms stop. Turn your head to the opposite direction until your unaffected ear is facing down. Your head should continue to dangle. Hold this position for at least 30 seconds. If you feel dizzy or have symptoms of  vertigo, continue to hold the position until the symptoms stop. Turn your whole body to the same side as your head so that you are positioned on your side. Your head will now be nearly facedown and no longer needs to dangle. Hold for at least 30 seconds. If you feel dizzy or have symptoms of vertigo, continue to hold the position until the symptoms stop. Sit back up. You can repeat the maneuver in 24 hours if your vertigo does not go away. Follow these instructions at home: For 24 hours after doing the Epley maneuver: Keep your head in an upright position. When lying down to sleep or rest, keep your head raised (elevated) with two or more pillows. Avoid excessive neck movements. Activity Do not drive or use machinery if you feel dizzy. After doing the Epley maneuver, return to your normal activities as told by your health care provider. Ask your health care provider what activities are safe for you. General instructions Drink enough fluid to keep your urine pale yellow. Do not drink alcohol. Take over-the-counter and prescription medicines only as told by your health care provider. Keep all follow-up visits. This is important. Preventing vertigo symptoms Ask your health care provider if there is anything you should do at home to prevent vertigo. He or she may recommend that you: Keep your head elevated with two or more pillows while you sleep. Do not sleep on the side of your affected ear. Get  up slowly from bed. Avoid sudden movements during the day. Avoid extreme head positions or movement, such as looking up or bending over. Contact a health care provider if: Your vertigo gets worse. You have other symptoms, including: Nausea. Vomiting. Headache. Get help right away if you: Have vision changes. Have a headache or neck pain that is severe or getting worse. Cannot stop vomiting. Have new numbness or weakness in any part of your body. These symptoms may represent a serious problem  that is an emergency. Do not wait to see if the symptoms will go away. Get medical help right away. Call your local emergency services (911 in the [place] Do not drive yourself to the hospital. Summary Vertigo is the feeling that you or your surroundings are moving when they are not. The Epley maneuver is an exercise that relieves symptoms of vertigo. If the Epley maneuver is done correctly, it is considered safe. This information is not intended to replace advice given to you by your health care provider. Make sure you discuss any questions you have with your health care provider. Document Revised: 10/06/2022 Document Reviewed: 10/06/2022 Elsevier Patient Education  2024 [employer].

## 2024-01-29 NOTE — Progress Notes (Signed)
 "  Subjective:    Patient ID: [name], female    DOB: [birth year], 59 y.o.   MRN: [id number]  Chief Complaint  Patient presents with   Dizziness    Pt states dizziness started after having sinus congestion  x1 week. Pt states still having blow nose and having ear fullness.     HPI Patient is in today for dizziness with sinus congestion.   Discussed the use of AI scribe software for clinical note transcription with the patient, who gave verbal consent to proceed.  History of Present Illness [name] is a 59 year old female who presents with dizziness and ear discomfort.  She has been experiencing severe dizzy spells for over a week, particularly when trying to sleep, sitting up in the morning, or reclining in a chair. The sensation is described as 'spinning more than normal.'  She has a history of sinus infections occurring annually around the change of weather, typically around Thanksgiving and Christmas. Currently, she is experiencing nasal congestion, primarily on the left side, and left ear discomfort. The left ear feels like it is 'burning or stinging' and is warmer than usual. She describes a sensation of fullness in the ear and says it feels like being underwater. She recalls using a Q-tip in her left ear and noticing a small amount of blood, possibly from scratching the ear canal.  For her symptoms, she is using Flonase  and over-the-counter Xyzal  (levocetirizine) for allergies. She has also used Mucinex  and decongestants previously, which helped alleviate her symptoms.  She has not been engaging in physical activity recently due to dizziness, although she had planned to start walking as a New Year's resolution. She is concerned about weight gain and lack of exercise.  Her mother has a history of severe vertigo.  No current sore throat, though she experienced one day of soreness due to drainage around Thanksgiving. She denies feeling congested overall but notes hoarseness  upon arrival at the clinic.    Past Medical History:  Diagnosis Date   Allergy    Anemia    Anxiety    Arthritis    Asthma    Depression    GERD (gastroesophageal reflux disease)    Heart murmur    from childhood- pt had ECHO 12/27/21   Hiatal hernia    Hypertension    Peptic ulcer    PTSD (post-traumatic stress disorder)    UTI (urinary tract infection)     Past Surgical History:  Procedure Laterality Date   ABDOMINAL HYSTERECTOMY     fibroids   BIOPSY  07/11/2018   Procedure: BIOPSY;  Surgeon: Aneita Gwendlyn DASEN, MD;  Location: [facility] ENDOSCOPY;  Service: Endoscopy;;   BREAST BIOPSY Left    COLONOSCOPY  2014   ESOPHAGOGASTRODUODENOSCOPY (EGD) WITH PROPOFOL  N/A 07/11/2018   Procedure: ESOPHAGOGASTRODUODENOSCOPY (EGD) WITH PROPOFOL ;  Surgeon: Aneita Gwendlyn DASEN, MD;  Location: [facility] ENDOSCOPY;  Service: Endoscopy;  Laterality: N/A;   GASTRIC BYPASS  2007   HEMOSTASIS CLIP PLACEMENT  07/11/2018   Procedure: HEMOSTASIS CLIP PLACEMENT;  Surgeon: Aneita Gwendlyn DASEN, MD;  Location: [facility] ENDOSCOPY;  Service: Endoscopy;;   LAPAROTOMY N/A 03/08/2018   Procedure: EXPLORATORY LAPAROTOMY, REPAIR AND PATCH CLOSURE OF PERFORATED ULCER;  Surgeon: Sebastian Moles, MD;  Location: [facility] OR;  Service: General;  Laterality: N/A;   REDUCTION MAMMAPLASTY Bilateral    TONSILLECTOMY     and adenoids removed as a child   TOTAL KNEE ARTHROPLASTY Right 12/12/2022   Procedure: RIGHT TOTAL KNEE ARTHROPLASTY;  Surgeon: Vernetta Lonni GRADE, MD;  Location: [facility] OR;  Service: Orthopedics;  Laterality: Right;   TUBAL LIGATION      Family History  Problem Relation Age of Onset   Stroke Mother    Alzheimer's disease Mother    Hypertension Father    Cancer Father        Prostate   Dementia Father    Alcohol abuse Sister    Depression Sister    Alcohol abuse Brother    Depression Brother    Diabetes Maternal Grandmother    Alzheimer's disease Maternal Grandmother    Diabetes  Maternal Grandfather    Liver disease Maternal Grandfather    Cancer Maternal Grandfather        Prostate   Diabetes Paternal Grandmother    Alzheimer's disease Paternal Grandmother    Diabetes Paternal Grandfather    Cancer Paternal Grandfather        Lung, Prostate   Colon cancer Neg Hx    Esophageal cancer Neg Hx    Pancreatic cancer Neg Hx    Stomach cancer Neg Hx    Colon polyps Neg Hx    Rectal cancer Neg Hx     Social History   Socioeconomic History   Marital status: Married    Spouse name: mark   Number of children: 0   Years of education: Not on file   Highest education level: 12th grade  Occupational History    Employer: [employer]    Comment: [employer]  Tobacco Use   Smoking status: Former    Current packs/day: 0.00    Types: Cigarettes    Quit date: 01/24/1987    Years since quitting: 37.0   Smokeless tobacco: Never  Vaping Use   Vaping status: Former   Substances: Flavoring   Devices: has vaped with flavor but no nicotine. Been 8 years  Substance and Sexual Activity   Alcohol use: Not Currently    Comment: pt has not drank alcohol since 02/2020   Drug use: No   Sexual activity: Not Currently  Other Topics Concern   Not on file  Social History Narrative   Regular exercise--no (because of knees)   Social Drivers of Health   Tobacco Use: Medium Risk (01/29/2024)   Patient History    Smoking Tobacco Use: Former    Smokeless Tobacco Use: Never    Passive Exposure: Not on file  Financial Resource Strain: Low Risk (07/30/2023)   Overall Financial Resource Strain (CARDIA)    Difficulty of Paying Living Expenses: Not very hard  Food Insecurity: No Food Insecurity (07/30/2023)   Epic    Worried About Radiation Protection Practitioner of Food in the Last Year: Never true    Ran Out of Food in the Last Year: Never true  Transportation Needs: No Transportation Needs (07/30/2023)   Epic    Lack of Transportation (Medical): No    Lack of  Transportation (Non-Medical): No  Physical Activity: Inactive (11/05/2023)   Exercise Vital Sign    Days of Exercise per Week: 0 days    Minutes of Exercise per Session: Not on file  Stress: Stress Concern Present (11/05/2023)   [employer] of Occupational Health - Occupational Stress Questionnaire    Feeling of Stress: Very much  Social Connections: Moderately Integrated (11/05/2023)   Social Connection and Isolation Panel    Frequency of Communication with Friends and Family: More than three times a week    Frequency of Social Gatherings with Friends and Family: Twice a week  Attends Religious Services: More than 4 times per year    Active Member of Clubs or Organizations: No    Attends Banker Meetings: Not on file    Marital Status: Married  Intimate Partner Violence: Not At Risk (12/12/2022)   Humiliation, Afraid, Rape, and Kick questionnaire    Fear of Current or Ex-Partner: No    Emotionally Abused: No    Physically Abused: No    Sexually Abused: No  Depression (PHQ2-9): High Risk (01/29/2024)   Depression (PHQ2-9)    PHQ-2 Score: 13  Alcohol Screen: Low Risk (11/07/2022)   Alcohol Screen    Last Alcohol Screening Score (AUDIT): 0  Housing: Unknown (07/30/2023)   Epic    Unable to Pay for Housing in the Last Year: No    Number of Times Moved in the Last Year: Not on file    Homeless in the Last Year: No  Utilities: Not At Risk (12/12/2022)   [facility]HC Utilities    Threatened with loss of utilities: No  Health Literacy: Not on file    Outpatient Medications Prior to Visit  Medication Sig Dispense Refill   acetaminophen  (TYLENOL ) 650 MG CR tablet Take 1,300 mg by mouth every 8 (eight) hours as needed (pain).     busPIRone  (BUSPAR ) 30 MG tablet Take 1 tablet (30 mg total) by mouth 2 (two) times daily. 60 tablet 5   Cholecalciferol  (VITAMIN D3) 50 MCG (2000 UT) capsule Take 1 capsule (2,000 Units total) by mouth daily. 100 capsule 0    clotrimazole -betamethasone  (LOTRISONE ) cream Apply 1 Application topically daily. 30 g 0   cyanocobalamin  (VITAMIN B12) 1000 MCG/ML injection Inject 1 mL (1,000 mcg total) into the muscle every 30 (thirty) days. 10 mL 0   cyclobenzaprine  (FLEXERIL ) 10 MG tablet Take 1 tablet (10 mg total) by mouth 3 (three) times daily as needed. for muscle spams 30 tablet 2   Diclofenac  Sodium 2 % SOLN Apply 1 pump twice daily as needed. 112 g 1   diphenoxylate -atropine  (LOMOTIL ) 2.5-0.025 MG tablet Take 1 tablet by mouth 4 (four) times daily as needed for diarrhea or loose stools. 90 tablet 1   estradiol  (ESTRACE ) 0.1 MG/GM vaginal cream Apply a pea-sized amount of cream to the fingertip and wipe in the front part of the vagina twice weekly 42.5 g 0   estradiol  (VIVELLE -DOT) 0.1 MG/24HR patch Place 1 patch (0.1 mg total) onto the skin 2 (two) times a week. 24 patch 1   fluticasone  (FLONASE ) 50 MCG/ACT nasal spray Place 2 sprays into both nostrils daily. 16 g 5   Homeopathic Products (ALLERGY MEDICINE PO) Take 1 tablet by mouth daily as needed (Takes the Clortabs. Equate Brand for allergies).     hydrOXYzine  (VISTARIL ) 25 MG capsule Take 1 capsule (25 mg total) by mouth 2 (two) times daily as needed for anxiety. 60 capsule 5   omeprazole  (PRILOSEC) 40 MG capsule Take 1 capsule (40 mg total) by mouth 2 (two) times daily. OPEN CAPSULE AND SPRINKLE ON APPLESAUCE. 60 capsule 4   OVER THE COUNTER MEDICATION Take 2 capsules by mouth daily. Mushroom Complex     Probiotic Product (ALIGN) CHEW Chew 2 tablets by mouth daily. Align pre and probiotic with vitamain C-take one tablet each Align pre and probiotic with Vitamin b12 for immune and energy-take one tablet each     QUEtiapine  (SEROQUEL ) 100 MG tablet Take 1 tablet (100 mg total) by mouth at bedtime. 90 tablet 0   rOPINIRole  (REQUIP ) 1 MG  tablet Take 1 tablet (1 mg total) by mouth at bedtime. 90 tablet 1   sertraline  (ZOLOFT ) 100 MG tablet Take 2 tablets  (200 mg total) by mouth daily. 180 tablet 1   SYRINGE-NEEDLE, DISP, 3 ML (B-D 3CC LUER-LOK SYR 25GX5/8) 25G X 5/8 3 ML MISC USE AS DIRECTED WITH B-12 INJECTION 16 each 2   furosemide  (LASIX ) 40 MG tablet Take 1 tablet (40 mg total) by mouth daily. 90 tablet 0   No facility-administered medications prior to visit.    Allergies[1]  Review of Systems  Constitutional:  Negative for fever and malaise/fatigue.  HENT:  Positive for congestion and ear pain. Negative for sore throat.   Eyes:  Negative for blurred vision.  Respiratory:  Negative for cough and shortness of breath.   Cardiovascular:  Negative for chest pain, palpitations and leg swelling.  Gastrointestinal:  Negative for abdominal pain, blood in stool and nausea.  Genitourinary:  Negative for dysuria and frequency.  Musculoskeletal:  Negative for falls.  Skin:  Negative for rash.  Neurological:  Positive for dizziness. Negative for loss of consciousness and headaches.  Endo/Heme/Allergies:  Negative for environmental allergies.  Psychiatric/Behavioral:  Negative for depression. The patient is not nervous/anxious.        Objective:    Physical Exam  BP 108/78 (BP Location: Right Arm, Patient Position: Sitting, Cuff Size: Normal)   Pulse 71   Temp 98.9 F (37.2 C) (Oral)   Resp 18   Ht 5' 2 (1.575 m)   Wt 169 lb 9.6 oz (76.9 kg)   SpO2 99%   BMI 31.02 kg/m  Wt Readings from Last 3 Encounters:  01/29/24 169 lb 9.6 oz (76.9 kg)  11/05/23 161 lb (73 kg)  07/30/23 154 lb 12.8 oz (70.2 kg)    Diabetic Foot Exam - Simple   No data filed    Lab Results  Component Value Date   WBC 6.3 11/05/2023   HGB 14.5 11/05/2023   HCT 44.0 11/05/2023   PLT 237.0 11/05/2023   GLUCOSE 89 11/05/2023   CHOL 202 (H) 11/05/2023   TRIG 75.0 11/05/2023   HDL 67.30 11/05/2023   LDLCALC 120 (H) 11/05/2023   ALT 44 (H) 11/05/2023   AST 34 11/05/2023   NA 141 11/05/2023   K 4.9 11/05/2023   CL 102 11/05/2023   CREATININE 0.91  11/05/2023   BUN 20 11/05/2023   CO2 31 11/05/2023   TSH 4.30 11/05/2023   INR 1.0 08/13/2023   HGBA1C 5.3 07/30/2023    Lab Results  Component Value Date   TSH 4.30 11/05/2023   Lab Results  Component Value Date   WBC 6.3 11/05/2023   HGB 14.5 11/05/2023   HCT 44.0 11/05/2023   MCV 92.2 11/05/2023   PLT 237.0 11/05/2023   Lab Results  Component Value Date   NA 141 11/05/2023   K 4.9 11/05/2023   CO2 31 11/05/2023   GLUCOSE 89 11/05/2023   BUN 20 11/05/2023   CREATININE 0.91 11/05/2023   BILITOT 0.3 11/05/2023   ALKPHOS 87 11/05/2023   AST 34 11/05/2023   ALT 44 (H) 11/05/2023   PROT 7.1 11/05/2023   ALBUMIN 4.4 11/05/2023   CALCIUM 9.2 11/05/2023   ANIONGAP 6 12/13/2022   GFR 69.51 11/05/2023   Lab Results  Component Value Date   CHOL 202 (H) 11/05/2023   Lab Results  Component Value Date   HDL 67.30 11/05/2023   Lab Results  Component Value Date   LDLCALC  120 (H) 11/05/2023   Lab Results  Component Value Date   TRIG 75.0 11/05/2023   Lab Results  Component Value Date   CHOLHDL 3 11/05/2023   Lab Results  Component Value Date   HGBA1C 5.3 07/30/2023       Assessment & Plan:  RLS (restless legs syndrome)  Acute frontal sinusitis, recurrence not specified -     Levocetirizine Dihydrochloride ; Take 1 tablet (5 mg total) by mouth every evening. -     Azithromycin ; Take 2 tablets on day 1, then 1 tablet daily on days 2 through 5  Dispense: 6 tablet; Refill: 0 -     predniSONE ; TAKE 3 TABLETS PO QD FOR 3 DAYS THEN TAKE 2 TABLETS PO QD FOR 3 DAYS THEN TAKE 1 TABLET PO QD FOR 3 DAYS THEN TAKE 1/2 TAB PO QD FOR 3 DAYS  Dispense: 20 tablet; Refill: 0  Non-recurrent acute serous otitis media of left ear -     Azithromycin ; Take 2 tablets on day 1, then 1 tablet daily on days 2 through 5  Dispense: 6 tablet; Refill: 0 -     predniSONE ; TAKE 3 TABLETS PO QD FOR 3 DAYS THEN TAKE 2 TABLETS PO QD FOR 3 DAYS THEN TAKE 1 TABLET PO QD FOR 3 DAYS THEN TAKE 1/2  TAB PO QD FOR 3 DAYS  Dispense: 20 tablet; Refill: 0   Assessment and Plan Assessment & Plan Acute serous otitis media and frontal sinusitis   Left ear pain and dizziness have persisted for over a week, likely due to fluid accumulation behind the eardrum causing equilibrium issues. Symptoms include left ear pain, nasal congestion, and drainage. Examination shows bulging of the left eardrum and fluid presence. Differential diagnosis includes vertigo due to sinus congestion. Current treatment with Flonase  and Xyzal  has shown improvement after starting Mucinex  and decongestants. Continue Flonase  and Xyzal . Prescribe penicillin for infection and prednisone  to reduce inflammation. Advise on Epley maneuver if dizziness persists after medication.  Restless legs syndrome   She was previously advised to take two doses of medication for restless legs syndrome, which has been effective. The prescription remains unchanged since the last visit. Sent prescription for restless legs syndrome medication to pharmacy.  +2 Arren Laminack R Lowne Chase, DO      [1] Allergies Allergen Reactions   Lamictal  [Lamotrigine ] Rash   Penicillins Other (See Comments)    Did it involve swelling of the face/tongue/throat, SOB, or low BP? Unk Did it involve sudden or severe rash/hives, skin peeling, or any reaction on the inside of your mouth or nose? Unk Did you need to seek medical attention at a hospital or doctor's office? Unk When did it last happen? Was 59 years old; reaction not recalled     If all above answers are NO, may proceed with cephalosporin use.    Codeine  Itching    Crazy dreams and itching-pt said she can take it with food  "

## 2024-01-30 ENCOUNTER — Other Ambulatory Visit (HOSPITAL_BASED_OUTPATIENT_CLINIC_OR_DEPARTMENT_OTHER): Payer: Self-pay

## 2024-01-30 ENCOUNTER — Other Ambulatory Visit: Payer: Self-pay

## 2024-01-31 ENCOUNTER — Other Ambulatory Visit: Payer: Self-pay

## 2024-02-01 ENCOUNTER — Other Ambulatory Visit: Payer: Self-pay

## 2024-02-06 ENCOUNTER — Encounter: Payer: Self-pay | Admitting: Licensed Clinical Social Worker

## 2024-02-06 ENCOUNTER — Ambulatory Visit (INDEPENDENT_AMBULATORY_CARE_PROVIDER_SITE_OTHER): Admitting: Licensed Clinical Social Worker

## 2024-02-06 DIAGNOSIS — F4310 Post-traumatic stress disorder, unspecified: Secondary | ICD-10-CM | POA: Diagnosis not present

## 2024-02-06 DIAGNOSIS — F429 Obsessive-compulsive disorder, unspecified: Secondary | ICD-10-CM

## 2024-02-06 DIAGNOSIS — F3342 Major depressive disorder, recurrent, in full remission: Secondary | ICD-10-CM

## 2024-02-06 DIAGNOSIS — F4010 Social phobia, unspecified: Secondary | ICD-10-CM

## 2024-02-06 NOTE — Progress Notes (Signed)
 "  THERAPIST PROGRESS NOTE  Session Time: 10:05am-11am  Participation Level: Active  Behavioral Response: CasualAlertEuthymic  Type of Therapy: Individual Therapy  Treatment Goals addressed:  Active     BH CCP Acute or Chronic Trauma Reaction     LTG: Recall traumatic events without becoming overwhelmed with negative emotions (Completed/Met)     Start:  05/01/23    Expected End:  01/15/24    Resolved:  10/16/23    Goal Note     10/16/23: Im not overwhelmed with it. I just know it happened.          STG: Reduce the negative impact trauma related symptoms have on social, occupational, and family functioning per pt self-report 3 out of 5 sessions documented.   (Progressing)     Start:  05/01/23    Expected End:  01/15/24      05/01/23: 50% progress I have to push myself, but I know I got to get it done.     Goal Note     02/06/24: The sleeping at night with everything going on, I'm going to do better on taking my sleep medications at the same time every night.          LTG:  Reduction in intrusive event recollections, avoidance of event reminders, intense arousal, or disinterest in activities or relationships  as evidenced by pt self report 3 out of 5 sessions documented.   (Completed/Met)     Start:  05/01/23    Expected End:  07/01/23    Resolved:  07/23/23   05/01/23: 50% progress I still have them quite often (2-3 a week) but reports she was experiencing them daily.     Goal Note     07/23/23: Reports she limits her social exposure to her family and best friend. Shares she is ok with this pattern and feels this is a safe way to ensure she has the support she needs to cope with stressors.          STG - Misc 1 (Completed/Met)     Start:  05/01/23    Expected End:  07/01/23    Resolved:  07/23/23   Goal: STG: [name] will identify internal and external stimuli that trigger PTSD symptoms  as evidenced by pt self report 3 out of 5 sessions documented.       05/01/23: 35% progress I try to avoid conflicts or in public and someone is having a fuss or screaming children.  Identifies she is able to utilize her coping skills to calm down.        STG: STG: [name] will acknowledge that healing from PTSD is a gradual process  as evidenced by pt self report 3 out of 5 sessions documented.   (Completed/Met)     Start:  05/01/23    Expected End:  07/01/23    Resolved:  05/01/23         05/01/23: 100% progress It's a real slow process for me.        STG:  [name] will identify negative coping strategies that have been used to cope with the feelings associated with the trauma  as evidenced by pt self report 3 out of 5 sessions documented.   (Progressing)     Start:  05/01/23    Expected End:  01/15/24      05/01/23: 50% progress My unhealthy was drinking all the time and with being sober 3 years and a couple of months. Acknowledged replacement and use of  healthier coping skills. Denies feeling comfortable joining AA but shares she can reach out to her support system for words of encouragement.       LTG: Elimination of maladaptive behaviors and thinking patterns which interfere with resolution of trauma as evidenced by self reports and PCL5 scores (Progressing)     Start:  10/16/23    Expected End:  01/15/24       Goal Note     02/06/24: Reports improvements with nightmares and flashbacks.          Teach [name] coping strategies (e.g., writing down thoughts and feelings in a journal; taking deep, slow breaths; calling a support person to talk about memories) to deal with trauma memories and sudden emotional reactions without becoming emotionally n     Start:  05/01/23         Provide and outline the treatment process to [name], explaining that it will include a gradual processing of the details and feelings associated with the trauma and developing new, more appropriate coping strategies (Completed)     Start:  05/01/23    End:   10/16/23      Cooperate with trauma-focused psychotherapy techniques to reduce emotional reaction to the traumatic event  (Completed)     Start:  05/01/23    End:  10/16/23        OP Depression     LTG: Reduce frequency, intensity, and duration of depression symptoms so that daily functioning is improved (Progressing)     Start:  10/16/23    Expected End:  01/15/24       Goal Note     02/06/24: I think alot of it is situations and my brain overloading and working too much. Cln observed patient demonstrated improvements with her ability to prevent herself from anxious what ifs through use of coping skills.          STG: [name] will identify cognitive patterns and beliefs that support depression (Progressing)     Start:  10/16/23    Expected End:  01/15/24         STG: Process grief related to feeling unloved within her marriage.  (Not Progressing)     Start:  10/16/23    Expected End:  01/15/24       Goal Note     02/06/24: patient continues to share difficulties within her personal relationships and feeling confusion about use of assertive communication.          Work with [name] to identify the major components of a recent episode of depression: physical symptoms, major thoughts and images, and major behaviors they experienced     Start:  10/16/23         [name] will identify 2 cognitive distortions they are currently using and write reframing statements to replace them     Start:  10/16/23            ProgressTowards Goals: Progressing   Interventions: CBT, Supportive, and Reframing   Summary: [name] is a 59 y.o. female who presents with symptoms of anxiety, depression, trauma. Patient identifies symptoms to include intrusive thoughts, reexperiencing, tearfulness, low mood, fatigue, difficulty sleeping, anxiety in large crowds. Pt was oriented times 5. Pt was cooperative and engaged. Pt denies SI/HI/AVH.    Cln utilized the first half of the session  to review the patient's progress, as documented in the progress notes above.  The clinician readministered the PHQ-9 and GAD-7 assessments. The patient's anxiety scores increased from 10  to 18, and her depression scores rose from 10 to 14. The patient shared that due to changes in financial support, she is losing government assistance, which has led to increased anxiety. She reported that her spouse has engaged in unhealthy communication, making her feel as though she cannot win in their interactions.  The patient continues to express difficulties in her personal relationships and feels confused about how to use assertive communication. She stated, I think a lot of it is situations and my brain overloading and working too much. Cln observed that the patient has demonstrated improvements in preventing herself from anxious what if thoughts by using coping skills. The patient mentioned, With everything going on, I'm going to do better about taking my sleep medications at the same time every night.  The patient celebrated her upcoming anniversary of four years of sobriety. She identified goals to increase her physical activity but acknowledged that fear gets in the way in certain environments. They worked together to find ways she can inquire about safety and controllable factors.  Suicidal/Homicidal: The patient is currently without intent or plan.  Therapist Response: Cln employed active listening to create a safe space for the patient to process recent life events and assessed her safety, stressors, and symptoms since the last session. The clinician readministered the PHQ-9 and GAD-7 assessments.  Updated patient's treatment plan based on reported progress.  Worked with patient to reflect on ways in which she can refrain from negative thoughts spiraling.  Worked with patient to acknowledge controllable factors in an effort for patient to begin to take action steps to address goals for the new year.    Plan: Return again in 3 weeks.   Diagnosis: PTSD (post-traumatic stress disorder)   Recurrent major depressive disorder, in full remission   Social anxiety disorder   Obsessive-compulsive disorder, unspecified type     Collaboration of Care: [facility] psychiatrist can access notes and cln. Will review psychiatrists' notes. Check in with the patient and will see LCSW per availability. Patient agreed with treatment recommendations.     Patient/Guardian was advised Release of Information must be obtained prior to any record release in order to collaborate their care with an outside provider. Patient/Guardian was advised if they have not already done so to contact the registration department to sign all necessary forms in order for us  to release information regarding their care.   Consent: Patient/Guardian gives verbal consent for treatment and assignment of benefits for services provided during this visit. Patient/Guardian expressed understanding and agreed to proceed.   [name] Husband, LCSW 02/06/2024  "

## 2024-02-14 ENCOUNTER — Other Ambulatory Visit: Payer: Self-pay

## 2024-02-14 ENCOUNTER — Encounter: Payer: Self-pay | Admitting: Psychiatry

## 2024-02-14 ENCOUNTER — Ambulatory Visit: Admitting: Psychiatry

## 2024-02-14 VITALS — BP 122/81 | HR 74 | Temp 97.4°F | Ht 62.0 in | Wt 170.2 lb

## 2024-02-14 DIAGNOSIS — F4310 Post-traumatic stress disorder, unspecified: Secondary | ICD-10-CM

## 2024-02-14 DIAGNOSIS — F429 Obsessive-compulsive disorder, unspecified: Secondary | ICD-10-CM

## 2024-02-14 DIAGNOSIS — F4010 Social phobia, unspecified: Secondary | ICD-10-CM

## 2024-02-14 DIAGNOSIS — F1021 Alcohol dependence, in remission: Secondary | ICD-10-CM | POA: Diagnosis not present

## 2024-02-14 DIAGNOSIS — G2581 Restless legs syndrome: Secondary | ICD-10-CM

## 2024-02-14 DIAGNOSIS — F3342 Major depressive disorder, recurrent, in full remission: Secondary | ICD-10-CM

## 2024-02-14 MED ORDER — SERTRALINE HCL 100 MG PO TABS
200.0000 mg | ORAL_TABLET | Freq: Every day | ORAL | 1 refills | Status: AC
  Filled 2024-02-14: qty 180, 90d supply, fill #0

## 2024-02-14 NOTE — Progress Notes (Unsigned)
 [facility] MD OP Progress Note  02/14/2024 1:32 PM [name]  MRN:  [id number]  Chief Complaint:  Chief Complaint  Patient presents with   Follow-up   Medication Refill   Depression   Anxiety   Discussed the use of AI scribe software for clinical note transcription with the patient, who gave verbal consent to proceed.  History of Present Illness [name] is a 59 year old Caucasian female, married, currently on Social Security disability, has a history of PTSD, MDD, OCD, social anxiety disorder, RLS, alcohol use disorder in remission, chronic pain, history of gastric bypass [place] in [place] was evaluated in office today for a follow-up appointment.  Persistent irritability and frustration affect her daily life, as she becomes easily frustrated and experiences a short temper. She reports difficulty concentrating and low motivation to engage in activities, linking these concerns in part to chronic pain. She notes that her lack of motivation likely relates to her pain and describes challenges with maintaining patience.  She reports that her mood has not completely improved, even after she recently received relief from disability approval and financial stressors. She acknowledges that these changes have provided only partial relief. Ongoing life stressors, especially those related to her relationship with Oneil her spent, make some days feel overwhelming. She states that most days she maintains a positive outlook and actively works through these issues in therapy.  She continues to attend therapy and describes the experience as going well. She identifies coping strategies such as discussing challenges with her therapist and making efforts to remain positive in the face of ongoing stressors.  She denies any recent suicidal thoughts or wish to be dead, noting that the last occurrence was a couple of months ago.  She reports that her sleep quality is satisfactory while she takes Seroquel .  She continues to take Buspar  as well as sertraline  daily.  She denies any side effects. She reports not taking Ambien . She experiences occasional jitteriness, which she associates with caffeine intake.  She reports recent weight gain and attributes this to prednisone  prescribed for a sinus infection. She acknowledges that increased weight exacerbates her pain and impacts her motivation.   Visit Diagnosis:    ICD-10-CM   1. PTSD (post-traumatic stress disorder)  F43.10 sertraline  (ZOLOFT ) 100 MG tablet    2. Recurrent major depressive disorder, in full remission  F33.42     3. Social anxiety disorder  F40.10 sertraline  (ZOLOFT ) 100 MG tablet    4. RLS (restless legs syndrome)  G25.81     5. Obsessive-compulsive disorder, unspecified type  F42.9     6. Alcohol use disorder, severe, in sustained remission (HCC)  F10.21       Past Psychiatric History: I have reviewed past psychiatric history from progress note on 04/04/2021.  Trials of medications like Wellbutrin, Zoloft , Xanax, Cymbalta, Pristiq, BuSpar , Deplin, Lexapro , Valium , Viibryd, Trintellix, Lamictal .  Patient had neuropsychological testing completed-05/05/2021-Dr. Rodenbough-does not meet criteria for ADHD.  Evaluated again on 06/01/2022-patient was recommended to continue psychiatric treatment for PTSD, depression, sleep at that visit.  Past Medical History:  Past Medical History:  Diagnosis Date   Allergy    Anemia    Anxiety    Arthritis    Asthma    Depression    GERD (gastroesophageal reflux disease)    Heart murmur    from childhood- pt had ECHO 12/27/21   Hiatal hernia    Hypertension    Peptic ulcer    PTSD (post-traumatic stress disorder)  UTI (urinary tract infection)     Past Surgical History:  Procedure Laterality Date   ABDOMINAL HYSTERECTOMY     fibroids   BIOPSY  07/11/2018   Procedure: BIOPSY;  Surgeon: Aneita Gwendlyn DASEN, MD;  Location: [facility] ENDOSCOPY;  Service: Endoscopy;;   BREAST BIOPSY Left     COLONOSCOPY  2014   ESOPHAGOGASTRODUODENOSCOPY (EGD) WITH PROPOFOL  N/A 07/11/2018   Procedure: ESOPHAGOGASTRODUODENOSCOPY (EGD) WITH PROPOFOL ;  Surgeon: Aneita Gwendlyn DASEN, MD;  Location: [facility] ENDOSCOPY;  Service: Endoscopy;  Laterality: N/A;   GASTRIC BYPASS  2007   HEMOSTASIS CLIP PLACEMENT  07/11/2018   Procedure: HEMOSTASIS CLIP PLACEMENT;  Surgeon: Aneita Gwendlyn DASEN, MD;  Location[facility] ENDOSCOPY;  Service: Endoscopy;;   LAPAROTOMY N/A 03/08/2018   Procedure: EXPLORATORY LAPAROTOMY, REPAIR AND PATCH CLOSURE OF PERFORATED ULCER;  Surgeon: Sebastian Moles, MD;  Location: [facility] OR;  Service: General;  Laterality: N/A;   REDUCTION MAMMAPLASTY Bilateral    TONSILLECTOMY     and adenoids removed as a child   TOTAL KNEE ARTHROPLASTY Right 12/12/2022   Procedure: RIGHT TOTAL KNEE ARTHROPLASTY;  Surgeon: Vernetta Lonni GRADE, MD;  Location: [facility] OR;  Service: Orthopedics;  Laterality: Right;   TUBAL LIGATION      Family Psychiatric History: I have reviewed family psychiatric history from progress note on 04/04/2021.  Family History:  Family History  Problem Relation Age of Onset   Stroke Mother    Alzheimer's disease Mother    Hypertension Father    Cancer Father        Prostate   Dementia Father    Alcohol abuse Sister    Depression Sister    Alcohol abuse Brother    Depression Brother    Diabetes Maternal Grandmother    Alzheimer's disease Maternal Grandmother    Diabetes Maternal Grandfather    Liver disease Maternal Grandfather    Cancer Maternal Grandfather        Prostate   Diabetes Paternal Grandmother    Alzheimer's disease Paternal Grandmother    Diabetes Paternal Grandfather    Cancer Paternal Grandfather        Lung, Prostate   Colon cancer Neg Hx    Esophageal cancer Neg Hx    Pancreatic cancer Neg Hx    Stomach cancer Neg Hx    Colon polyps Neg Hx    Rectal cancer Neg Hx     Social History: I have reviewed social history from progress note on 04/21/2021. Social  History   Socioeconomic History   Marital status: Married    Spouse name: mark   Number of children: 0   Years of education: Not on file   Highest education level: 12th grade  Occupational History    Employer: [employer]    Comment: [employer]  Tobacco Use   Smoking status: Former    Current packs/day: 0.00    Types: Cigarettes    Quit date: 01/24/1987    Years since quitting: 37.0   Smokeless tobacco: Never  Vaping Use   Vaping status: Former   Substances: Flavoring   Devices: has vaped with flavor but no nicotine. Been 8 years  Substance and Sexual Activity   Alcohol use: Not Currently    Comment: pt has not drank alcohol since 02/2020   Drug use: No   Sexual activity: Not Currently  Other Topics Concern   Not on file  Social History Narrative   Regular exercise--no (because of knees)   Social Drivers of Health   Tobacco Use: Medium  Risk (02/14/2024)   Patient History    Smoking Tobacco Use: Former    Smokeless Tobacco Use: Never    Passive Exposure: Not on file  Financial Resource Strain: Low Risk (07/30/2023)   Overall Financial Resource Strain (CARDIA)    Difficulty of Paying Living Expenses: Not very hard  Food Insecurity: No Food Insecurity (07/30/2023)   Epic    Worried About Programme Researcher, Broadcasting/film/video in the Last Year: Never true    Ran Out of Food in the Last Year: Never true  Transportation Needs: No Transportation Needs (07/30/2023)   Epic    Lack of Transportation (Medical): No    Lack of Transportation (Non-Medical): No  Physical Activity: Inactive (11/05/2023)   Exercise Vital Sign    Days of Exercise per Week: 0 days    Minutes of Exercise per Session: Not on file  Stress: Stress Concern Present (11/05/2023)   [employer] of Occupational Health - Occupational Stress Questionnaire    Feeling of Stress: Very much  Social Connections: Moderately Integrated (11/05/2023)   Social Connection and Isolation Panel    Frequency of Communication with Friends  and Family: More than three times a week    Frequency of Social Gatherings with Friends and Family: Twice a week    Attends Religious Services: More than 4 times per year    Active Member of Clubs or Organizations: No    Attends Banker Meetings: Not on file    Marital Status: Married  Depression (PHQ2-9): Medium Risk (02/14/2024)   Depression (PHQ2-9)    PHQ-2 Score: 10  Alcohol Screen: Low Risk (11/07/2022)   Alcohol Screen    Last Alcohol Screening Score (AUDIT): 0  Housing: Unknown (07/30/2023)   Epic    Unable to Pay for Housing in the Last Year: No    Number of Times Moved in the Last Year: Not on file    Homeless in the Last Year: No  Utilities: Not At Risk (12/12/2022)   AHC Utilities    Threatened with loss of utilities: No  Health Literacy: Not on file    Allergies: Allergies[1]  Metabolic Disorder Labs: Lab Results  Component Value Date   HGBA1C 5.3 07/30/2023   MPG 105.41 05/24/2022   Lab Results  Component Value Date   PROLACTIN 16.9 05/24/2022   Lab Results  Component Value Date   CHOL 202 (H) 11/05/2023   TRIG 75.0 11/05/2023   HDL 67.30 11/05/2023   CHOLHDL 3 11/05/2023   VLDL 15.0 11/05/2023   LDLCALC 120 (H) 11/05/2023   LDLCALC 145 (H) 07/30/2023   Lab Results  Component Value Date   TSH 4.30 11/05/2023   TSH 3.15 08/04/2022    Therapeutic Level Labs: No results found for: LITHIUM No results found for: VALPROATE No results found for: CBMZ  Current Medications: Current Outpatient Medications  Medication Sig Dispense Refill   acetaminophen  (TYLENOL ) 650 MG CR tablet Take 1,300 mg by mouth every 8 (eight) hours as needed (pain).     busPIRone  (BUSPAR ) 30 MG tablet Take 1 tablet (30 mg total) by mouth 2 (two) times daily. 60 tablet 5   Cholecalciferol  (VITAMIN D3) 50 MCG (2000 UT) capsule Take 1 capsule (2,000 Units total) by mouth daily. 100 capsule 0   clotrimazole -betamethasone  (LOTRISONE ) cream Apply 1 Application  topically daily. 30 g 0   cyanocobalamin  (VITAMIN B12) 1000 MCG/ML injection Inject 1 mL (1,000 mcg total) into the muscle every 30 (thirty) days. 10 mL 0   cyclobenzaprine  (  FLEXERIL ) 10 MG tablet Take 1 tablet (10 mg total) by mouth 3 (three) times daily as needed. for muscle spams 30 tablet 2   Diclofenac  Sodium 2 % SOLN Apply 1 pump twice daily as needed. 112 g 1   diphenoxylate -atropine  (LOMOTIL ) 2.5-0.025 MG tablet Take 1 tablet by mouth 4 (four) times daily as needed for diarrhea or loose stools. 90 tablet 1   estradiol  (ESTRACE ) 0.1 MG/GM vaginal cream Apply a pea-sized amount of cream to the fingertip and wipe in the front part of the vagina twice weekly 42.5 g 0   estradiol  (VIVELLE -DOT) 0.1 MG/24HR patch Place 1 patch (0.1 mg total) onto the skin 2 (two) times a week. 24 patch 1   fluticasone  (FLONASE ) 50 MCG/ACT nasal spray Place 2 sprays into both nostrils daily. 16 g 5   furosemide  (LASIX ) 40 MG tablet Take 1 tablet (40 mg total) by mouth daily. 90 tablet 0   Homeopathic Products (ALLERGY MEDICINE PO) Take 1 tablet by mouth daily as needed (Takes the Clortabs. Equate Brand for allergies).     hydrOXYzine  (VISTARIL ) 25 MG capsule Take 1 capsule (25 mg total) by mouth 2 (two) times daily as needed for anxiety. 60 capsule 5   levocetirizine (XYZAL ) 5 MG tablet Take 1 tablet (5 mg total) by mouth every evening.     omeprazole  (PRILOSEC) 40 MG capsule Take 1 capsule (40 mg total) by mouth 2 (two) times daily. OPEN CAPSULE AND SPRINKLE ON APPLESAUCE. 60 capsule 4   OVER THE COUNTER MEDICATION Take 2 capsules by mouth daily. Mushroom Complex     predniSONE  (DELTASONE ) 10 MG tablet Take 3 tablets (30 MG) by mouth for 3 days THEN take 2 tablets (20 mg) for 3 days THEN take 1 tablet (10 mg) for 3 days THEN take one-half tablet (5 mg) for 3 days. 20 tablet 0   Probiotic Product (ALIGN) CHEW Chew 2 tablets by mouth daily. Align pre and probiotic with vitamain C-take one tablet each Align pre and  probiotic with Vitamin b12 for immune and energy-take one tablet each     QUEtiapine  (SEROQUEL ) 100 MG tablet Take 1 tablet (100 mg total) by mouth at bedtime. 90 tablet 0   rOPINIRole  (REQUIP ) 2 MG tablet Take 1 tablet (2 mg total) by mouth at bedtime. 90 tablet 3   sertraline  (ZOLOFT ) 100 MG tablet Take 2 tablets (200 mg total) by mouth daily. 180 tablet 1   SYRINGE-NEEDLE, DISP, 3 ML (B-D 3CC LUER-LOK SYR 25GX5/8) 25G X 5/8 3 ML MISC USE AS DIRECTED WITH B-12 INJECTION 16 each 2   No current facility-administered medications for this visit.     Musculoskeletal: Strength & Muscle Tone: within normal limits Gait & Station: normal Patient leans: N/A  Psychiatric Specialty Exam: Review of Systems  Psychiatric/Behavioral:         Irritable    Blood pressure 122/81, pulse 74, temperature (!) 97.4 F (36.3 C), temperature source Temporal, height 5' 2 (1.575 m), weight 170 lb 3.2 oz (77.2 kg).Body mass index is 31.13 kg/m.  General Appearance: Casual  Eye Contact:  Fair  Speech:  Clear and Coherent  Volume:  Normal  Mood:  Irritable likely due to current pain  Affect:  Appropriate  Thought Process:  Goal Directed and Descriptions of Associations: Intact  Orientation:  Full (Time, Place, and Person)  Thought Content: Logical   Suicidal Thoughts:  No  Homicidal Thoughts:  No  Memory:  Immediate;   Fair Recent;   Fair  Remote;   Fair  Judgement:  Fair  Insight:  Fair  Psychomotor Activity:  Normal  Concentration:  Concentration: Fair and Attention Span: Fair  Recall:  [employer] of Knowledge: Fair  Language: Fair  Akathisia:  No  Handed:  Right  AIMS (if indicated): done  Assets:  Communication Skills Desire for Improvement Housing Resilience Social Support Talents/Skills  ADL's:  Intact  Cognition: WNL  Sleep:  Fair   Screenings: Midwife Visit from 02/14/2024 in [facility] Office Visit from 05/23/2023  in [facility] Office Visit from 11/01/2022 in [facility] Office Visit from 03/03/2022 in [facility] Office Visit from 01/20/2022 in [facility]  AIMS Total Score 0 0 0 0 0   GAD-7    Flowsheet Row Counselor from 02/06/2024 in [facility] Office Visit from 01/29/2024 in [facility] Counselor from 10/16/2023 in [facility] Counselor from 07/23/2023 in [facility] Office Visit from 05/23/2023 in [facility]  Total GAD-7 Score 18 21 18 10 17    PHQ2-9    Flowsheet Row Office Visit from 02/14/2024 in [facility] Counselor from 02/06/2024 in [facility] Office Visit from 01/29/2024 in [facility] Counselor from 10/16/2023 in [facility] Counselor from 07/23/2023 in [facility]  PHQ-2 Total Score 3 3 2 2 4   PHQ-9 Total Score 10 14 13 13 10    Flowsheet Row Office Visit from 02/14/2024 in [facility] Counselor from 02/06/2024 in [facility] Video Visit from 12/28/2023 in [facility]  C-SSRS RISK CATEGORY Moderate Risk Error: Q3, 4, or 5 should not be populated when Q2 is No Moderate Risk     Assessment and Plan: [name] is a 59 year old Caucasian female who presented for a follow-up appointment, discussed assessment and plan as noted below.  1. PTSD (post-traumatic stress disorder)-improving Ongoing interpersonal  relationship struggles does affect her mood along with pain.  She does report irritability although overall making progress. Continue psychotherapy sessions with Ms. Perkins Continue sertraline  200 mg daily Continue Seroquel  100 mg daily at bedtime Continue BuSpar  30 mg twice a day  2. Recurrent major depressive disorder, in full remission Denies any significant hopelessness anhedonia although does report lack of motivation likely due to pain Continue current medication regimen including sertraline , BuSpar  and Seroquel   3. Social anxiety disorder-improving Currently able to attend to more activities in social settings Continue CBT  4. RLS (restless legs syndrome)-improving Currently reports improvement and reports recent increase of Requip   per primary provider. Continue Requip  2 mg at bedtime  5. Obsessive-compulsive disorder, unspecified type-improving Currently reports improvement Continue sertraline  and BuSpar  as prescribed Continue CBT  6. Alcohol use disorder, severe, in sustained remission (HCC) Currently sober   Follow-up Follow-up in clinic in 3 months or sooner if needed.  Collaboration of Care: Collaboration of Care: Referral or follow-up with counselor/therapist AEB patient encouraged to continue CBT with Ms. Lanell I have reviewed notes most recent 02/06/2024.  Patient/Guardian was advised Release of Information must be obtained prior to any record release in order  to collaborate their care with an outside provider. Patient/Guardian was advised if they have not already done so to contact the registration department to sign all necessary forms in order for us  to release information regarding their care.   Consent: Patient/Guardian gives verbal consent for treatment and assignment of benefits for services provided during this visit. Patient/Guardian expressed understanding and agreed to proceed.  This note was generated in part or whole with voice recognition software.  Voice recognition is usually quite accurate but there are transcription errors that can and very often do occur. I apologize for any typographical errors that were not detected and corrected.     Semir Brill, MD 02/14/2024, 1:32 PM     [1]  Allergies Allergen Reactions   Lamictal  [Lamotrigine ] Rash   Penicillins Other (See Comments)    Did it involve swelling of the face/tongue/throat, SOB, or low BP? Unk Did it involve sudden or severe rash/hives, skin peeling, or any reaction on the inside of your mouth or nose? Unk Did you need to seek medical attention at a hospital or doctor's office? Unk When did it last happen? Was 59 years old; reaction not recalled     If all above answers are NO, may proceed with cephalosporin use.    Codeine  Itching    Crazy dreams and itching-pt said she can take it with food

## 2024-02-27 ENCOUNTER — Other Ambulatory Visit: Payer: Self-pay

## 2024-02-27 ENCOUNTER — Ambulatory Visit: Admitting: Licensed Clinical Social Worker

## 2024-02-27 DIAGNOSIS — F4010 Social phobia, unspecified: Secondary | ICD-10-CM | POA: Diagnosis not present

## 2024-02-27 DIAGNOSIS — F429 Obsessive-compulsive disorder, unspecified: Secondary | ICD-10-CM | POA: Diagnosis not present

## 2024-02-27 DIAGNOSIS — F3342 Major depressive disorder, recurrent, in full remission: Secondary | ICD-10-CM | POA: Diagnosis not present

## 2024-02-27 DIAGNOSIS — F4310 Post-traumatic stress disorder, unspecified: Secondary | ICD-10-CM | POA: Diagnosis not present

## 2024-02-27 NOTE — Progress Notes (Addendum)
 "  THERAPIST PROGRESS NOTE  Session Time: 10:05-10:50am  Participation Level: Active  Behavioral Response: CasualAlertAnxious and Tearful  Type of Therapy: Individual Therapy  Treatment Goals addressed:  Active     BH CCP Acute or Chronic Trauma Reaction     LTG: Recall traumatic events without becoming overwhelmed with negative emotions (Completed/Met)     Start:  05/01/23    Expected End:  01/15/24    Resolved:  10/16/23    Goal Note     10/16/23: Im not overwhelmed with it. I just know it happened.          STG: Reduce the negative impact trauma related symptoms have on social, occupational, and family functioning per pt self-report 3 out of 5 sessions documented.   (Progressing)     Start:  05/01/23    Expected End:  01/15/24      05/01/23: 50% progress I have to push myself, but I know I got to get it done.     Goal Note     02/06/24: The sleeping at night with everything going on, I'm going to do better on taking my sleep medications at the same time every night.          LTG:  Reduction in intrusive event recollections, avoidance of event reminders, intense arousal, or disinterest in activities or relationships  as evidenced by pt self report 3 out of 5 sessions documented.   (Completed/Met)     Start:  05/01/23    Expected End:  07/01/23    Resolved:  07/23/23   05/01/23: 50% progress I still have them quite often (2-3 a week) but reports she was experiencing them daily.     Goal Note     07/23/23: Reports she limits her social exposure to her family and best friend. Shares she is ok with this pattern and feels this is a safe way to ensure she has the support she needs to cope with stressors.          STG - Misc 1 (Completed/Met)     Start:  05/01/23    Expected End:  07/01/23    Resolved:  07/23/23   Goal: STG: [name] will identify internal and external stimuli that trigger PTSD symptoms  as evidenced by pt self report 3 out of 5 sessions documented.       05/01/23: 35% progress I try to avoid conflicts or in public and someone is having a fuss or screaming children.  Identifies she is able to utilize her coping skills to calm down.        STG: STG: [name] will acknowledge that healing from PTSD is a gradual process  as evidenced by pt self report 3 out of 5 sessions documented.   (Completed/Met)     Start:  05/01/23    Expected End:  07/01/23    Resolved:  05/01/23         05/01/23: 100% progress It's a real slow process for me.        STG:  [name] will identify negative coping strategies that have been used to cope with the feelings associated with the trauma  as evidenced by pt self report 3 out of 5 sessions documented.   (Progressing)     Start:  05/01/23    Expected End:  01/15/24      05/01/23: 50% progress My unhealthy was drinking all the time and with being sober 3 years and a couple of months. Acknowledged replacement and  use of healthier coping skills. Denies feeling comfortable joining AA but shares she can reach out to her support system for words of encouragement.       LTG: Elimination of maladaptive behaviors and thinking patterns which interfere with resolution of trauma as evidenced by self reports and PCL5 scores (Progressing)     Start:  10/16/23    Expected End:  01/15/24       Goal Note     02/06/24: Reports improvements with nightmares and flashbacks.          Teach [name] coping strategies (e.g., writing down thoughts and feelings in a journal; taking deep, slow breaths; calling a support person to talk about memories) to deal with trauma memories and sudden emotional reactions without becoming emotionally n     Start:  05/01/23         Provide and outline the treatment process to [name], explaining that it will include a gradual processing of the details and feelings associated with the trauma and developing new, more appropriate coping strategies (Completed)     Start:  05/01/23    End:   10/16/23      Cooperate with trauma-focused psychotherapy techniques to reduce emotional reaction to the traumatic event  (Completed)     Start:  05/01/23    End:  10/16/23        OP Depression     LTG: Reduce frequency, intensity, and duration of depression symptoms so that daily functioning is improved (Progressing)     Start:  10/16/23    Expected End:  01/15/24       Goal Note     02/06/24: I think alot of it is situations and my brain overloading and working too much. Cln observed patient demonstrated improvements with her ability to prevent herself from anxious what ifs through use of coping skills.          STG: [name] will identify cognitive patterns and beliefs that support depression (Progressing)     Start:  10/16/23    Expected End:  01/15/24         STG: Process grief related to feeling unloved within her marriage.  (Not Progressing)     Start:  10/16/23    Expected End:  01/15/24       Goal Note     02/06/24: patient continues to share difficulties within her personal relationships and feeling confusion about use of assertive communication.          Work with [name] to identify the major components of a recent episode of depression: physical symptoms, major thoughts and images, and major behaviors they experienced     Start:  10/16/23         [name] will identify 2 cognitive distortions they are currently using and write reframing statements to replace them     Start:  10/16/23            ProgressTowards Goals: Progressing  Interventions: Solution Focused and Supportive  Summary: [name] is a 59 y.o. female who presents with symptoms of anxiety, depression, trauma. Patient identifies symptoms to include intrusive thoughts, reexperiencing, tearfulness, low mood, fatigue, difficulty sleeping, anxiety in large crowds. Pt was oriented times 5. Pt was cooperative and engaged. Pt denies SI/HI/AVH.     Clinician was shadowed by Rolin Bologna for the duration of the appointment.  The patient reflected on recent stressors, noting an increase in chronic pain due to the weather and shoveling snow. 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 The patient discussed a recent conversation with her psychiatrist about discontinuing Seroquel , citing concerns about weight gain. She identified personal goals to create more opportunities for walking on the indoor track and reflected on improved efforts to incorporate self-care into her daily routine.  The patient became tearful while discussing the recent worsening of her obsessive-compulsive disorder (OCD) symptoms, specifically experiencing uncontrollable thoughts of death 1-2 times a week. She described disturbing visions of herself with a bloody head in the snow and visualizing wrecks while driving. These thoughts have been a recurring issue since her teenage years, leading her to tattoo her name on her neck, as she believes one day she may be found unidentifiable. The clinician reflected on possible circumstances that may have contributed to these increased symptoms, with the patient reporting recent stressors related to finances and disruptions in her routine due to the weather. Although the thought of death does not scare her as much because of her faith, she continues to struggle with redirecting her attention away from OCD-related thoughts.  The patient reported some improvements with exposure therapy, including progress in her desire for even numbers and organizing her refrigerator. The clinician explored ways for the patient to practice cognitive dissonance by recognizing OCD thoughts as just that--OCD thoughts.  In the next appointment, the clinician will focus on exposure work related to the patient's thoughts of death.  Suicidal/Homicidal: Nowithout  intent/plan  Therapist Response: Cln employed active listening to create a safe space for the patient to process recent life events and assessed her safety, stressors, and symptoms since the last session.  Clinician worked with patient to explore identifiable stressors possibly contributing to worsening OCD symptoms.  Reflected on possible solutions to separating patient's OCD thoughts from her other thoughts.   Plan: Return again in 2 weeks.  Diagnosis: PTSD (post-traumatic stress disorder)  Recurrent major depressive disorder, in full remission  Social anxiety disorder  Obsessive-compulsive disorder, unspecified type   Collaboration of Care: [facility] psychiatrist can access notes and cln. Will review psychiatrists' notes. Check in with the patient and will see LCSW per availability. Patient agreed with treatment recommendations   Patient/Guardian was advised Release of Information must be obtained prior to any record release in order to collaborate their care with an outside provider. Patient/Guardian was advised if they have not already done so to contact the registration department to sign all necessary forms in order for us  to release information regarding their care.   Consent: Patient/Guardian gives verbal consent for treatment and assignment of benefits for services provided during this visit. Patient/Guardian expressed understanding and agreed to proceed.   Mary KATHEE Husband, LCSW 02/27/2024  "

## 2024-03-19 ENCOUNTER — Ambulatory Visit: Admitting: Licensed Clinical Social Worker

## 2024-03-26 ENCOUNTER — Ambulatory Visit: Admitting: Licensed Clinical Social Worker

## 2024-04-29 ENCOUNTER — Ambulatory Visit: Admitting: Licensed Clinical Social Worker
